# Patient Record
Sex: Female | Born: 1935
Health system: Southern US, Community
[De-identification: ages and names within clinical notes are randomized; demographics above are authoritative.]

## PROBLEM LIST (undated history)

## (undated) DIAGNOSIS — D649 Anemia, unspecified: Secondary | ICD-10-CM

## (undated) DIAGNOSIS — R109 Unspecified abdominal pain: Secondary | ICD-10-CM

## (undated) DIAGNOSIS — Z8719 Personal history of other diseases of the digestive system: Secondary | ICD-10-CM

## (undated) DIAGNOSIS — IMO0002 Reserved for concepts with insufficient information to code with codable children: Secondary | ICD-10-CM

## (undated) DIAGNOSIS — K219 Gastro-esophageal reflux disease without esophagitis: Secondary | ICD-10-CM

## (undated) DIAGNOSIS — N259 Disorder resulting from impaired renal tubular function, unspecified: Secondary | ICD-10-CM

## (undated) DIAGNOSIS — J45909 Unspecified asthma, uncomplicated: Secondary | ICD-10-CM

## (undated) DIAGNOSIS — J309 Allergic rhinitis, unspecified: Secondary | ICD-10-CM

## (undated) DIAGNOSIS — K279 Peptic ulcer, site unspecified, unspecified as acute or chronic, without hemorrhage or perforation: Secondary | ICD-10-CM

## (undated) DIAGNOSIS — H269 Unspecified cataract: Secondary | ICD-10-CM

## (undated) DIAGNOSIS — K56609 Unspecified intestinal obstruction, unspecified as to partial versus complete obstruction: Secondary | ICD-10-CM

## (undated) DIAGNOSIS — B029 Zoster without complications: Secondary | ICD-10-CM

## (undated) DIAGNOSIS — E7800 Pure hypercholesterolemia, unspecified: Secondary | ICD-10-CM

## (undated) DIAGNOSIS — I10 Essential (primary) hypertension: Secondary | ICD-10-CM

## (undated) DIAGNOSIS — R0789 Other chest pain: Secondary | ICD-10-CM

## (undated) DIAGNOSIS — J449 Chronic obstructive pulmonary disease, unspecified: Secondary | ICD-10-CM

## (undated) DIAGNOSIS — E039 Hypothyroidism, unspecified: Secondary | ICD-10-CM

## (undated) DIAGNOSIS — D692 Other nonthrombocytopenic purpura: Secondary | ICD-10-CM

## (undated) DIAGNOSIS — Z780 Asymptomatic menopausal state: Secondary | ICD-10-CM

## (undated) DIAGNOSIS — N209 Urinary calculus, unspecified: Secondary | ICD-10-CM

## (undated) DIAGNOSIS — F450 Somatization disorder: Secondary | ICD-10-CM

## (undated) HISTORY — DX: Allergic rhinitis, unspecified: J30.9

## (undated) HISTORY — DX: Unspecified intestinal obstruction, unspecified as to partial versus complete obstruction: K56.609

## (undated) HISTORY — DX: Disorder resulting from impaired renal tubular function, unspecified: N25.9

## (undated) HISTORY — PX: COLON SURGERY: SHX602

## (undated) HISTORY — DX: Chronic obstructive pulmonary disease, unspecified: J44.9

## (undated) HISTORY — DX: Anemia, unspecified: D64.9

## (undated) HISTORY — DX: Somatization disorder: F45.0

## (undated) HISTORY — PX: OTHER SURGICAL HISTORY: SHX169

## (undated) HISTORY — DX: Peptic ulcer, site unspecified, unspecified as acute or chronic, without hemorrhage or perforation: K27.9

## (undated) HISTORY — PX: TONSILLECTOMY: SHX5217

## (undated) HISTORY — DX: Gastro-esophageal reflux disease without esophagitis: K21.9

## (undated) HISTORY — PX: TUBAL LIGATION: SHX77

## (undated) HISTORY — DX: Unspecified asthma, uncomplicated: J45.909

## (undated) HISTORY — DX: Other chest pain: R07.89

## (undated) HISTORY — DX: Unspecified abdominal pain: R10.9

## (undated) HISTORY — DX: Personal history of other diseases of the digestive system: Z87.19

## (undated) HISTORY — DX: Pure hypercholesterolemia, unspecified: E78.00

## (undated) HISTORY — DX: Reserved for concepts with insufficient information to code with codable children: IMO0002

## (undated) HISTORY — DX: Hypothyroidism, unspecified: E03.9

## (undated) HISTORY — DX: Asymptomatic menopausal state: Z78.0

## (undated) HISTORY — DX: Urinary calculus, unspecified: N20.9

## (undated) HISTORY — DX: Unspecified cataract: H26.9

## (undated) HISTORY — DX: Essential (primary) hypertension: I10

---
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
## 1957-02-07 HISTORY — PX: APPENDECTOMY: SHX54

## 1965-02-07 HISTORY — PX: NASAL SINUS SURGERY: SHX719

## 1968-02-08 HISTORY — PX: DENTAL SURGERY: SHX609

## 1968-10-08 HISTORY — PX: OTHER SURGICAL HISTORY: SHX169

## 1979-02-08 HISTORY — PX: ABDOMINAL HYSTERECTOMY: SHX81

## 1981-02-07 HISTORY — PX: CHOLECYSTECTOMY: SHX55

## 2001-06-22 ENCOUNTER — Ambulatory Visit (HOSPITAL_COMMUNITY): Admission: RE | Admit: 2001-06-22 | Discharge: 2001-06-22 | Payer: Self-pay | Admitting: Family Medicine

## 2001-09-12 ENCOUNTER — Encounter: Payer: Self-pay | Admitting: Family Medicine

## 2001-09-12 ENCOUNTER — Ambulatory Visit (HOSPITAL_COMMUNITY): Admission: RE | Admit: 2001-09-12 | Discharge: 2001-09-12 | Payer: Self-pay | Admitting: Family Medicine

## 2002-10-10 ENCOUNTER — Encounter: Payer: Self-pay | Admitting: Family Medicine

## 2002-10-10 ENCOUNTER — Encounter: Admission: RE | Admit: 2002-10-10 | Discharge: 2002-10-10 | Payer: Self-pay | Admitting: Family Medicine

## 2003-12-16 ENCOUNTER — Encounter: Admission: RE | Admit: 2003-12-16 | Discharge: 2003-12-16 | Payer: Self-pay | Admitting: Endocrinology

## 2004-01-29 ENCOUNTER — Ambulatory Visit: Payer: Self-pay | Admitting: Endocrinology

## 2004-02-10 ENCOUNTER — Ambulatory Visit: Payer: Self-pay | Admitting: *Deleted

## 2004-02-19 ENCOUNTER — Ambulatory Visit: Payer: Self-pay

## 2004-04-15 ENCOUNTER — Ambulatory Visit: Payer: Self-pay | Admitting: *Deleted

## 2004-07-29 ENCOUNTER — Ambulatory Visit: Payer: Self-pay | Admitting: Endocrinology

## 2004-08-12 ENCOUNTER — Encounter: Admission: RE | Admit: 2004-08-12 | Discharge: 2004-08-12 | Payer: Self-pay | Admitting: Endocrinology

## 2004-08-19 ENCOUNTER — Ambulatory Visit: Payer: Self-pay | Admitting: Endocrinology

## 2004-08-20 ENCOUNTER — Ambulatory Visit: Payer: Self-pay | Admitting: Cardiology

## 2004-08-26 ENCOUNTER — Ambulatory Visit (HOSPITAL_COMMUNITY): Admission: RE | Admit: 2004-08-26 | Discharge: 2004-08-26 | Payer: Self-pay | Admitting: Endocrinology

## 2004-10-19 ENCOUNTER — Ambulatory Visit: Payer: Self-pay | Admitting: *Deleted

## 2004-10-21 ENCOUNTER — Ambulatory Visit: Payer: Self-pay | Admitting: Endocrinology

## 2004-10-28 ENCOUNTER — Ambulatory Visit: Payer: Self-pay | Admitting: Endocrinology

## 2004-11-10 ENCOUNTER — Ambulatory Visit: Payer: Self-pay | Admitting: Endocrinology

## 2004-12-09 ENCOUNTER — Ambulatory Visit: Payer: Self-pay | Admitting: Endocrinology

## 2004-12-28 ENCOUNTER — Ambulatory Visit: Payer: Self-pay | Admitting: Internal Medicine

## 2005-04-19 ENCOUNTER — Encounter: Admission: RE | Admit: 2005-04-19 | Discharge: 2005-04-19 | Payer: Self-pay | Admitting: Endocrinology

## 2005-05-03 ENCOUNTER — Ambulatory Visit: Payer: Self-pay | Admitting: *Deleted

## 2005-08-17 ENCOUNTER — Ambulatory Visit: Payer: Self-pay | Admitting: Endocrinology

## 2005-08-25 ENCOUNTER — Ambulatory Visit: Payer: Self-pay | Admitting: Endocrinology

## 2005-09-22 ENCOUNTER — Ambulatory Visit: Payer: Self-pay | Admitting: Internal Medicine

## 2005-09-30 ENCOUNTER — Ambulatory Visit: Payer: Self-pay | Admitting: Internal Medicine

## 2005-09-30 LAB — HM COLONOSCOPY

## 2005-10-02 ENCOUNTER — Inpatient Hospital Stay (HOSPITAL_COMMUNITY): Admission: EM | Admit: 2005-10-02 | Discharge: 2005-10-13 | Payer: Self-pay | Admitting: Emergency Medicine

## 2005-10-04 ENCOUNTER — Ambulatory Visit: Payer: Self-pay | Admitting: Gastroenterology

## 2005-10-04 ENCOUNTER — Encounter (INDEPENDENT_AMBULATORY_CARE_PROVIDER_SITE_OTHER): Payer: Self-pay | Admitting: *Deleted

## 2005-10-25 ENCOUNTER — Ambulatory Visit: Payer: Self-pay | Admitting: Endocrinology

## 2005-11-14 ENCOUNTER — Ambulatory Visit: Payer: Self-pay | Admitting: Endocrinology

## 2006-02-24 ENCOUNTER — Ambulatory Visit: Payer: Self-pay | Admitting: Endocrinology

## 2006-02-24 LAB — CONVERTED CEMR LAB
Hemoglobin: 11.8 g/dL — ABNORMAL LOW (ref 12.0–15.0)
MCHC: 32.2 g/dL (ref 30.0–36.0)
RDW: 12.4 % (ref 11.5–14.6)
Saturation Ratios: 29.1 % (ref 20.0–50.0)
Transferrin: 213.7 mg/dL (ref >=212.0)
WBC: 6.7 10*3/uL (ref 4.5–10.5)

## 2006-03-10 ENCOUNTER — Ambulatory Visit: Payer: Self-pay | Admitting: Internal Medicine

## 2006-03-22 ENCOUNTER — Ambulatory Visit: Payer: Self-pay | Admitting: Internal Medicine

## 2006-04-21 ENCOUNTER — Encounter: Admission: RE | Admit: 2006-04-21 | Discharge: 2006-04-21 | Payer: Self-pay | Admitting: Endocrinology

## 2006-05-09 ENCOUNTER — Ambulatory Visit: Payer: Self-pay | Admitting: *Deleted

## 2006-05-09 LAB — CONVERTED CEMR LAB
BUN: 13 mg/dL (ref 6–23)
Calcium: 9.9 mg/dL (ref 8.4–10.5)
Chloride: 106 meq/L (ref 96–112)
Creatinine, Ser: 0.6 mg/dL (ref 0.4–1.2)
GFR calc Af Amer: 127 mL/min
Glucose, Bld: 101 mg/dL — ABNORMAL HIGH (ref 70–99)
Sodium: 142 meq/L (ref 135–145)

## 2006-05-11 ENCOUNTER — Ambulatory Visit: Payer: Self-pay | Admitting: Internal Medicine

## 2006-08-04 ENCOUNTER — Ambulatory Visit (HOSPITAL_COMMUNITY): Admission: RE | Admit: 2006-08-04 | Discharge: 2006-08-04 | Payer: Self-pay | Admitting: Internal Medicine

## 2006-08-23 ENCOUNTER — Ambulatory Visit: Payer: Self-pay | Admitting: Internal Medicine

## 2006-09-21 ENCOUNTER — Encounter: Payer: Self-pay | Admitting: Endocrinology

## 2006-09-21 DIAGNOSIS — K219 Gastro-esophageal reflux disease without esophagitis: Secondary | ICD-10-CM

## 2006-09-21 DIAGNOSIS — J309 Allergic rhinitis, unspecified: Secondary | ICD-10-CM | POA: Insufficient documentation

## 2006-09-21 DIAGNOSIS — E039 Hypothyroidism, unspecified: Secondary | ICD-10-CM

## 2006-09-21 DIAGNOSIS — J45909 Unspecified asthma, uncomplicated: Secondary | ICD-10-CM | POA: Insufficient documentation

## 2006-09-21 DIAGNOSIS — N259 Disorder resulting from impaired renal tubular function, unspecified: Secondary | ICD-10-CM

## 2006-09-21 DIAGNOSIS — I10 Essential (primary) hypertension: Secondary | ICD-10-CM

## 2006-09-21 DIAGNOSIS — D649 Anemia, unspecified: Secondary | ICD-10-CM

## 2006-09-21 DIAGNOSIS — Z8719 Personal history of other diseases of the digestive system: Secondary | ICD-10-CM

## 2006-09-21 HISTORY — DX: Personal history of other diseases of the digestive system: Z87.19

## 2006-09-21 HISTORY — DX: Disorder resulting from impaired renal tubular function, unspecified: N25.9

## 2006-09-21 HISTORY — DX: Hypothyroidism, unspecified: E03.9

## 2006-09-21 HISTORY — DX: Unspecified asthma, uncomplicated: J45.909

## 2006-09-21 HISTORY — DX: Anemia, unspecified: D64.9

## 2006-09-21 HISTORY — DX: Allergic rhinitis, unspecified: J30.9

## 2006-09-21 HISTORY — DX: Gastro-esophageal reflux disease without esophagitis: K21.9

## 2006-09-21 HISTORY — DX: Essential (primary) hypertension: I10

## 2007-04-25 ENCOUNTER — Ambulatory Visit: Payer: Self-pay | Admitting: Endocrinology

## 2007-04-25 DIAGNOSIS — F450 Somatization disorder: Secondary | ICD-10-CM

## 2007-04-25 HISTORY — DX: Somatization disorder: F45.0

## 2007-04-25 LAB — CONVERTED CEMR LAB
ALT: 17 units/L (ref 0–35)
AST: 25 units/L (ref 0–37)
Albumin: 3.9 g/dL (ref 3.5–5.2)
Alkaline Phosphatase: 41 units/L (ref 39–117)
Basophils Absolute: 0.1 10*3/uL (ref 0.0–0.1)
Basophils Relative: 2 % — ABNORMAL HIGH (ref 0.0–1.0)
Bilirubin Urine: NEGATIVE
Bilirubin, Direct: 0.1 mg/dL (ref 0.0–0.3)
CO2: 29 meq/L (ref 19–32)
Creatinine, Ser: 0.5 mg/dL (ref 0.4–1.2)
Eosinophils Relative: 17.6 % — ABNORMAL HIGH (ref 0.0–5.0)
GFR calc Af Amer: 156 mL/min
GFR calc non Af Amer: 129 mL/min
Glucose, Bld: 87 mg/dL (ref 70–99)
HDL: 56.3 mg/dL (ref >39.0)
Hemoglobin: 11.9 g/dL — ABNORMAL LOW (ref 12.0–15.0)
Ketones, ur: NEGATIVE mg/dL
MCHC: 32.5 g/dL (ref 30.0–36.0)
Mucus, UA: NEGATIVE
Neutro Abs: 2.4 10*3/uL (ref 1.4–7.7)
Nitrite: NEGATIVE
Platelets: 299 10*3/uL (ref 150–400)
RBC: 3.83 M/uL — ABNORMAL LOW (ref 3.87–5.11)
RDW: 12.4 % (ref 11.5–14.6)
Sodium: 138 meq/L (ref 135–145)
Total Bilirubin: 0.8 mg/dL (ref 0.3–1.2)
Total Protein, Urine: NEGATIVE mg/dL
Triglycerides: 140 mg/dL (ref 0–149)
Urobilinogen, UA: 0.2 (ref 0.0–1.0)
VLDL: 28 mg/dL (ref 0–40)
WBC: 6.6 10*3/uL (ref 4.5–10.5)

## 2007-05-02 ENCOUNTER — Ambulatory Visit: Payer: Self-pay | Admitting: Endocrinology

## 2007-05-04 ENCOUNTER — Telehealth (INDEPENDENT_AMBULATORY_CARE_PROVIDER_SITE_OTHER): Payer: Self-pay | Admitting: *Deleted

## 2007-05-23 ENCOUNTER — Encounter: Admission: RE | Admit: 2007-05-23 | Discharge: 2007-05-23 | Payer: Self-pay | Admitting: Endocrinology

## 2008-05-26 ENCOUNTER — Encounter: Admission: RE | Admit: 2008-05-26 | Discharge: 2008-05-26 | Payer: Self-pay | Admitting: Endocrinology

## 2008-05-28 ENCOUNTER — Ambulatory Visit: Payer: Self-pay | Admitting: Endocrinology

## 2008-05-28 DIAGNOSIS — Z780 Asymptomatic menopausal state: Secondary | ICD-10-CM | POA: Insufficient documentation

## 2008-05-28 DIAGNOSIS — N209 Urinary calculus, unspecified: Secondary | ICD-10-CM

## 2008-05-28 DIAGNOSIS — R0789 Other chest pain: Secondary | ICD-10-CM | POA: Insufficient documentation

## 2008-05-28 DIAGNOSIS — R109 Unspecified abdominal pain: Secondary | ICD-10-CM

## 2008-05-28 HISTORY — DX: Asymptomatic menopausal state: Z78.0

## 2008-05-28 HISTORY — DX: Unspecified abdominal pain: R10.9

## 2008-05-28 HISTORY — DX: Other chest pain: R07.89

## 2008-05-28 HISTORY — DX: Urinary calculus, unspecified: N20.9

## 2008-06-02 ENCOUNTER — Telehealth: Payer: Self-pay | Admitting: Endocrinology

## 2008-06-05 ENCOUNTER — Ambulatory Visit: Payer: Self-pay | Admitting: Internal Medicine

## 2008-06-05 ENCOUNTER — Telehealth: Payer: Self-pay | Admitting: Endocrinology

## 2008-06-05 ENCOUNTER — Encounter: Payer: Self-pay | Admitting: Endocrinology

## 2008-08-05 ENCOUNTER — Telehealth: Payer: Self-pay | Admitting: Endocrinology

## 2008-12-22 ENCOUNTER — Telehealth: Payer: Self-pay | Admitting: Internal Medicine

## 2009-05-19 ENCOUNTER — Telehealth: Payer: Self-pay | Admitting: Endocrinology

## 2009-06-05 ENCOUNTER — Ambulatory Visit: Payer: Self-pay | Admitting: Endocrinology

## 2009-06-05 DIAGNOSIS — E7800 Pure hypercholesterolemia, unspecified: Secondary | ICD-10-CM

## 2009-06-05 HISTORY — DX: Pure hypercholesterolemia, unspecified: E78.00

## 2009-06-06 ENCOUNTER — Encounter: Payer: Self-pay | Admitting: Endocrinology

## 2009-06-06 LAB — CONVERTED CEMR LAB
Calcium, Total (PTH): 10.6 mg/dL — ABNORMAL HIGH (ref 8.4–10.5)
PTH: 32.9 pg/mL (ref 14.0–72.0)

## 2009-06-11 ENCOUNTER — Encounter: Admission: RE | Admit: 2009-06-11 | Discharge: 2009-06-11 | Payer: Self-pay | Admitting: Endocrinology

## 2009-06-24 ENCOUNTER — Ambulatory Visit: Payer: Self-pay | Admitting: Endocrinology

## 2009-06-25 LAB — CONVERTED CEMR LAB
Fecal Occult Blood: NEGATIVE
OCCULT 3: NEGATIVE
OCCULT 4: NEGATIVE
OCCULT 5: NEGATIVE

## 2010-02-28 ENCOUNTER — Encounter: Payer: Self-pay | Admitting: Endocrinology

## 2010-03-07 LAB — CONVERTED CEMR LAB
AST: 27 units/L (ref 0–37)
Albumin: 3.9 g/dL (ref 3.5–5.2)
Albumin: 4.2 g/dL (ref 3.5–5.2)
Basophils Absolute: 0 10*3/uL (ref 0.0–0.1)
Basophils Absolute: 0.1 10*3/uL (ref 0.0–0.1)
Basophils Relative: 0.4 % (ref 0.0–3.0)
Bilirubin, Direct: 0.1 mg/dL (ref 0.0–0.3)
Bilirubin, Direct: 0.2 mg/dL (ref 0.0–0.3)
CO2: 30 meq/L (ref 19–32)
Calcium: 10.4 mg/dL (ref 8.4–10.5)
Chloride: 104 meq/L (ref 96–112)
Cholesterol: 194 mg/dL (ref 0–200)
Creatinine, Ser: 0.6 mg/dL (ref 0.4–1.2)
Eosinophils Relative: 12.1 % — ABNORMAL HIGH (ref 0.0–5.0)
Eosinophils Relative: 12.1 % — ABNORMAL HIGH (ref 0.0–5.0)
GFR calc non Af Amer: 104.11 mL/min (ref >60)
Glucose, Bld: 86 mg/dL (ref 70–99)
Glucose, Bld: 91 mg/dL (ref 70–99)
HCT: 34.3 % — ABNORMAL LOW (ref 36.0–46.0)
HCT: 36.1 % (ref 36.0–46.0)
HDL: 69.7 mg/dL (ref >39.00)
Hemoglobin: 12.7 g/dL (ref 12.0–15.0)
Iron: 53 ug/dL (ref 42–145)
LDL Cholesterol: 111 mg/dL — ABNORMAL HIGH (ref 0–99)
Lymphocytes Relative: 31 % (ref 12.0–46.0)
MCV: 93.9 fL (ref 78.0–100.0)
Monocytes Absolute: 0.7 10*3/uL (ref 0.1–1.0)
Neutro Abs: 3 10*3/uL (ref 1.4–7.7)
RBC: 3.62 M/uL — ABNORMAL LOW (ref 3.87–5.11)
RBC: 3.84 M/uL — ABNORMAL LOW (ref 3.87–5.11)
Saturation Ratios: 22.1 % (ref 20.0–50.0)
Total Bilirubin: 0.8 mg/dL (ref 0.3–1.2)
Total CHOL/HDL Ratio: 3
Total CHOL/HDL Ratio: 3
Total Protein: 6.6 g/dL (ref 6.0–8.3)
Total Protein: 7 g/dL (ref 6.0–8.3)
Transferrin: 220.1 mg/dL (ref 212.0–360.0)
VLDL: 11 mg/dL (ref 0.0–40.0)
VLDL: 18.6 mg/dL (ref 0.0–40.0)
Vitamin B-12: 667 pg/mL (ref 211–911)
WBC: 6.1 10*3/uL (ref 4.5–10.5)
WBC: 6.3 10*3/uL (ref 4.5–10.5)

## 2010-03-09 NOTE — Progress Notes (Signed)
Summary: Rx request  Phone Note Call from Patient Call back at Home Phone [phone]   Caller: Patient Summary of Call: pt  called stating that she has made appt with SAE 04/29 and is requesting a 30 day supply of her Levothyroxine to [facility] [address] Initial call taken by: Crissie Sickles, CMA,  May 19, 2009 10:49 AM    Prescriptions: LEVOTHYROXINE SODIUM 50 MCG TABS (LEVOTHYROXINE SODIUM) 1 qd  #30 x 0   Entered by:   Crissie Sickles, CMA   Authorized by:   Donavan Foil MD   Signed by:   Crissie Sickles, CMA on 05/19/2009   Method used:   Electronically to        [id number] (retail)       [place]       [place],   [address]       Ph: [phone] or [phone]       Fax: [phone]   RxID:   [id number]

## 2010-03-09 NOTE — Assessment & Plan Note (Signed)
Summary: YEARLY FU/ MEDICARE/ TO COME FASTING/NWS   Vital Signs:  Patient profile:   76 year old female Height:      68 inches (172.72 cm) Weight:      143.13 pounds (65.06 kg) BMI:     21.84 O2 Sat:      94 % on Room air Temp:     96.7 degrees F (35.94 degrees C) oral Pulse rate:   78 / minute BP sitting:   110 / 68  (left arm) Cuff size:   regular  Vitals Entered By: Gardenia Phlegm RMA (June 05, 2009 9:27 AM)  O2 Flow:  Room air CC: Yearly follow up/ pt states she is no longer taking Aerobid/ CF Is Patient Diabetic? No   CC:  Yearly follow up/ pt states she is no longer taking Aerobid/ CF.  History of Present Illness: here for regular wellness examination.  she does not drink or smoke.    Current Medications (verified): 1)  Multivitamins   Tabs (Multiple Vitamin) .... Take 1 By Mouth Qd 2)  Adult Aspirin Ec Low Strength 81 Mg  Tbec (Aspirin) .... Take 1 By Mouth Qd 3)  Aerobid-M 250 Mcg/act Aers (Flunisolide) .... 2 Puffs Two Times A Day 4)  Levothyroxine Sodium 50 Mcg Tabs (Levothyroxine Sodium) .Marland Kitchen.. 1 Qd 5)  Flovent Hfa 110 Mcg/act Aero (Fluticasone Propionate  Hfa) .... 2 Puffs Bid  Allergies (verified): 1)  ! * Foradil 2)  ! Macrobid 3)  ! * Asprin  Past History:  Past Medical History: Last updated: 09/21/2006 Allergic rhinitis Anemia-NOS Asthma Diverticulitis, hx of GERD Hypertension Hypothyroidism Renal insufficiency Degenrative Disc Disease Ulcers, Stomach Rheumatic fever Hx of kidney stones Hx of phlebitis/ Blood clos Dyslipidemia Eosinophilia  Family History: Reviewed history from 05/02/2007 and no changes required. mother had pancreatic cancer father had bladder cancer a son had primary bone cancer son has marfan's syndrome son has hemochromatosis  Social History: Reviewed history from 05/02/2007 and no changes required. retired divorced x many years 3 sons all live nearby  Review of Systems  The patient denies fever, vision  loss, decreased hearing, chest pain, syncope, dyspnea on exertion, prolonged cough, headaches, melena, severe indigestion/heartburn, and suspicious skin lesions.    Physical Exam  General:  normal appearance.   Head:  head: no deformity eyes: no periorbital swelling, no proptosis external nose and ears are normal mouth: no lesion seen Breasts:  No tenderness, masses, nipple discharge, or skin abnormalities.  Lungs:  Clear to auscultation bilaterally. Normal respiratory effort.  Heart:  Regular rate and rhythm without murmurs or gallops noted. Normal S1,S2.   Rectal:  refused Genitalia:  refused Msk:  muscle bulk and strength are grossly normal.  no obvious joint swelling.  gait is normal and steady  Pulses:  dorsalis pedis intact bilat.  no carotid bruit  Extremities:  no deformity.  no ulcer on the feet.  feet are of normal color and temp.  no edema  Neurologic:  cn 2-12 grossly intact.   readily moves all 4's.   sensation is intact to touch on the feet  Skin:  normal texture and temp.  no rash.  not diaphoretic  Cervical Nodes:  No significant adenopathy.  Psych:  Alert and cooperative; normal mood and affect; normal attention span and concentration.   Additional Exam:  SEPARATE EVALUATION FOLLOWS--EACH PROBLEM HERE IS NEW, NOT RESPONDING TO TREATMENT, OR POSES SIGNIFICANT RISK TO THE PATIENT'S HEALTH: HISTORY OF THE PRESENT ILLNESS: the status of at least 3  ongoing medical problems is addressed today: pt states 28 years of right flank pain, which persists. hypothyroid:  she takes synthroid as rx'ed.  she has weight gain she has h/o anemia.  no brbpr PAST MEDICAL HISTORY reviewed and up to date today REVIEW OF SYSTEMS: denies hematuria and depression PHYSICAL EXAMINATION: neck: no goiter abdomen is soft, nontender.  no hepatosplenomegaly.   not distended.  no hernia skin: not pale LAB/XRAY RESULTS: Hemoglobin           [L]  11.9 g/dL                    12.0-15.0 Hematocrit           [L]  34.3 %                      36.0-46.0 FastTSH              [H]  6.72 uIU/mL                 0.35-5.50 Iron Saturation      [L]  17.9 %                      20.0-50.0 Cholesterol LDL        120.8 mg/dL IMPRESSION: fe-deficiency anemia, needs increased rx hypothyroid, needs increased rx flank pain in pt with h/o urolithoiasis dyslipidemia PLAN: see instruction page   Impression & Recommendations:  Problem # 1:  ROUTINE GENERAL MEDICAL EXAM@HEALTH  CARE FACL (ICD-V70.0)  Medications Added to Medication List This Visit: 1)  Levothyroxine Sodium 75 Mcg Tabs (Levothyroxine sodium) .Marland Kitchen.. 1 once daily  Other Orders: EKG w/ Interpretation (93000) T-Parathyroid Hormone, Intact w/ Calcium (26378-58850) Radiology Referral (Radiology) TLB-Lipid Panel (80061-LIPID) TLB-BMP (Basic Metabolic Panel-BMET) (27741-OINOMVE) TLB-CBC Platelet - w/Differential (85025-CBCD) TLB-Hepatic/Liver Function Pnl (80076-HEPATIC) TLB-TSH (Thyroid Stimulating Hormone) (84443-TSH) TLB-IBC Pnl (Iron/FE;Transferrin) (83550-IBC) Est. Patient Level IV (72094) Est. Patient 65& > (70962)  Preventive Care Screening  Last Flu Shot:    Date:  12/08/2008    Results:  historical    Patient Instructions: 1)  blood tests today. 2)  check ultrasound of the right kidney.  you will be called with a day and time for an appointment 3)  tests are being ordered for you today.  a few days after the test(s), please call [phone] to hear your test results. 4)  pending the test results, please continue the same medications for now 5)  please consider these measures for your health:  minimize alcohol.  do not use tobacco products.  have a colonoscopy at least every 10 years from age 7.  keep firearms safely stored.  always use seat belts.  have working smoke alarms in your home.  see the dentist regularly.  never drive under the influence of alcohol or drugs (including prescription drugs).  those  with fair skin should take precautions against the sun. 6)  please let me know what your wishes would be, if artificial life support measures should become necessary.  it is critically important to prevent falling down (keep floor areas well-lit, dry, and free of loose objects) 7)  we discussed code status.  pt requests full code, but would not want to be started or maintained on artificial life-support measures if there was not a reasonable chance of recovery 8)  (update: i left message on phone-tree: take fe 1/day.  increase synthroid to 75/day). Prescriptions: FLOVENT HFA 110 MCG/ACT AERO (FLUTICASONE PROPIONATE  HFA) 2 puffs bid  #1 month x 10   Entered and Authorized by:   Donavan Foil MD   Signed by:   Donavan Foil MD on 06/07/2009   Method used:   Electronically to        [id number] (retail)       [address]       [place], [place]  [address]       Ph: [phone] or [phone]       Fax: [phone]   RxID:   [id number] LEVOTHYROXINE SODIUM 75 MCG TABS (LEVOTHYROXINE SODIUM) 1 once daily  #30 x 11   Entered and Authorized by:   Donavan Foil MD   Signed by:   Donavan Foil MD on 06/07/2009   Method used:   Electronically to        [id number] (retail)       [address]       [place], [place]  [address]       Ph: [phone] or [phone]       Fax: [phone]   RxID:   [id number]   Preventive Care Screening  Last Flu Shot:    Date:  12/08/2008    Results:  historical

## 2010-05-21 ENCOUNTER — Other Ambulatory Visit: Payer: Self-pay | Admitting: Endocrinology

## 2010-05-26 ENCOUNTER — Other Ambulatory Visit: Payer: Self-pay | Admitting: Endocrinology

## 2010-05-26 DIAGNOSIS — Z1231 Encounter for screening mammogram for malignant neoplasm of breast: Secondary | ICD-10-CM

## 2010-06-10 ENCOUNTER — Encounter: Payer: Self-pay | Admitting: Endocrinology

## 2010-06-10 DIAGNOSIS — F450 Somatization disorder: Secondary | ICD-10-CM

## 2010-06-11 ENCOUNTER — Other Ambulatory Visit (INDEPENDENT_AMBULATORY_CARE_PROVIDER_SITE_OTHER): Payer: Medicare Other

## 2010-06-11 ENCOUNTER — Encounter: Payer: Self-pay | Admitting: Endocrinology

## 2010-06-11 ENCOUNTER — Ambulatory Visit (INDEPENDENT_AMBULATORY_CARE_PROVIDER_SITE_OTHER): Payer: Medicare Other | Admitting: Endocrinology

## 2010-06-11 VITALS — BP 122/78 | HR 71 | Temp 97.9°F | Ht 68.0 in | Wt 140.0 lb

## 2010-06-11 DIAGNOSIS — N259 Disorder resulting from impaired renal tubular function, unspecified: Secondary | ICD-10-CM

## 2010-06-11 DIAGNOSIS — Z0000 Encounter for general adult medical examination without abnormal findings: Secondary | ICD-10-CM | POA: Insufficient documentation

## 2010-06-11 DIAGNOSIS — E039 Hypothyroidism, unspecified: Secondary | ICD-10-CM

## 2010-06-11 DIAGNOSIS — Z136 Encounter for screening for cardiovascular disorders: Secondary | ICD-10-CM

## 2010-06-11 DIAGNOSIS — D649 Anemia, unspecified: Secondary | ICD-10-CM

## 2010-06-11 DIAGNOSIS — Z79899 Other long term (current) drug therapy: Secondary | ICD-10-CM | POA: Insufficient documentation

## 2010-06-11 DIAGNOSIS — L97909 Non-pressure chronic ulcer of unspecified part of unspecified lower leg with unspecified severity: Secondary | ICD-10-CM

## 2010-06-11 DIAGNOSIS — E7800 Pure hypercholesterolemia, unspecified: Secondary | ICD-10-CM

## 2010-06-11 DIAGNOSIS — I10 Essential (primary) hypertension: Secondary | ICD-10-CM

## 2010-06-11 LAB — HEPATIC FUNCTION PANEL
ALT: 14 U/L (ref 0–35)
AST: 24 U/L (ref 0–37)
Alkaline Phosphatase: 45 U/L (ref 39–117)
Total Bilirubin: 0.6 mg/dL (ref 0.3–1.2)

## 2010-06-11 LAB — BASIC METABOLIC PANEL
BUN: 16 mg/dL (ref 6–23)
CO2: 27 mEq/L (ref 19–32)
Chloride: 104 mEq/L (ref 96–112)
GFR: 134.31 mL/min (ref >60.00)
Glucose, Bld: 88 mg/dL (ref 70–99)
Potassium: 4.8 mEq/L (ref 3.5–5.1)
Sodium: 140 mEq/L (ref 135–145)

## 2010-06-11 LAB — CBC WITH DIFFERENTIAL/PLATELET
Basophils Relative: 0.6 % (ref 0.0–3.0)
Eosinophils Relative: 12.3 % — ABNORMAL HIGH (ref 0.0–5.0)
HCT: 35 % — ABNORMAL LOW (ref 36.0–46.0)
Hemoglobin: 11.9 g/dL — ABNORMAL LOW (ref 12.0–15.0)
MCHC: 33.9 g/dL (ref 30.0–36.0)
MCV: 94.8 fl (ref 78.0–100.0)
RDW: 13.1 % (ref 11.5–14.6)

## 2010-06-11 LAB — IBC PANEL: Iron: 68 ug/dL (ref 42–145)

## 2010-06-11 LAB — URINALYSIS, ROUTINE W REFLEX MICROSCOPIC
Bilirubin Urine: NEGATIVE
Hgb urine dipstick: NEGATIVE
Ketones, ur: NEGATIVE
Leukocytes, UA: NEGATIVE
Nitrite: NEGATIVE

## 2010-06-11 MED ORDER — TRIAMCINOLONE ACETONIDE 0.1 % EX CREA: TOPICAL_CREAM | Freq: Three times a day (TID) | CUTANEOUS | Status: AC

## 2010-06-11 NOTE — Progress Notes (Signed)
Subjective:    Patient ID: [name], female    DOB: [birth year], 76 y.o.   MRN: [id number]  HPI here for regular wellness examination.  she's feeling pretty well in general, and says chronic med probs are stable, except as noted below.  Past Medical History  Diagnosis Date  . HYPOTHYROIDISM 09/21/2006  . HYPERCHOLESTEROLEMIA 06/05/2009  . ANEMIA-NOS 09/21/2006  . Somatization disorder 04/25/2007  . HYPERTENSION 09/21/2006  . ALLERGIC RHINITIS 09/21/2006  . ASTHMA 09/21/2006  . GERD 09/21/2006  . RENAL INSUFFICIENCY 09/21/2006  . UNSPECIFIED URINARY CALCULUS 05/28/2008  . CHEST PAIN, ATYPICAL 05/28/2008  . ABDOMINAL PAIN, CHRONIC 05/28/2008  . DIVERTICULITIS, HX OF 09/21/2006  . ASYMPTOMATIC POSTMENOPAUSAL STATUS 05/28/2008  . DDD (degenerative disc disease)     Past Surgical History  Procedure Date  . Nasal sinus surgery 1967  . Dental surgery 1970  . Appendectomy 1959  . Cholecystectomy 1983  . Abdominal hysterectomy 1981  . Pulmonary thrombosis   . Tonsillectomy   . Asthma & pneumonia birth 20's  . Acute nephritis 1958    History   Social History  . Marital Status: Divorced    Spouse Name: N/A    Number of Children: N/A  . Years of Education: N/A   Occupational History  . Not on file.   Social History Main Topics  . Smoking status: Never Smoker   . Smokeless tobacco: Not on file  . Alcohol Use: No  . Drug Use: No  . Sexually Active:    Other Topics Concern  . Not on file   Social History Narrative  . No narrative on file  divorced retired  Current Outpatient Prescriptions on File Prior to Visit  Medication Sig Dispense Refill  . aspirin 81 MG EC tablet Take 81 mg by mouth daily.        . fluticasone (FLOVENT HFA) 110 MCG/ACT inhaler Inhale 2 puffs into the lungs 2 (two) times daily.        Marland Kitchen levothyroxine (SYNTHROID, LEVOTHROID) 75 MCG tablet 1 ONCE DAILY  30 tablet  1  . Multiple Vitamin (MULTIVITAMIN) tablet Take 1 tablet by mouth daily.           Allergies  Allergen Reactions  . Nitrofurantoin     REACTION: Syncope    No family history on file.  BP 122/78  Pulse 71  Temp(Src) 97.9 F (36.6 C) (Oral)  Ht 5' 8"  (1.727 m)  Wt 140 lb (63.504 kg)  BMI 21.29 kg/m2  SpO2 95%     Review of Systems  Constitutional:       Denies weight gain  HENT: Negative for hearing loss.   Eyes: Negative for visual disturbance.  Respiratory: Negative for shortness of breath.   Cardiovascular: Negative for chest pain.  Gastrointestinal: Negative for blood in stool.  Genitourinary: Negative for hematuria.  Musculoskeletal: Negative for arthralgias.  Skin: Negative for pallor.  Neurological: Negative for syncope.  Hematological: Bruises/bleeds easily.  Psychiatric/Behavioral: The patient is not nervous/anxious.        Objective:   Physical Exam VS: see vs page GEN: no distress HEAD: head: no deformity eyes: no periorbital swelling, no proptosis external nose and ears are normal mouth: no lesion seen NECK: supple, thyroid is not enlarged CHEST WALL: no deformity BREASTS:  No mass.  No d/c CV: reg rate and rhythm, no murmur ABD: abdomen is soft, nontender.  no hepatosplenomegaly.  not distended.  no hernia MUSCULOSKELETAL: muscle bulk and strength are grossly normal.  no obvious joint swelling.  gait is normal and steady EXTEMITIES: no deformity.  no ulcer on the feet.  feet are of normal color and temp.  no edema.  The right great toe is surgically absent. PULSES: dorsalis pedis intact bilat.  no carotid bruit NEURO:  cn 2-12 grossly intact.   readily moves all 4's.  sensation is intact to touch on the feet   NODES:  None palpable at the neck PSYCH: alert, oriented x3.  Does not appear anxious nor depressed.      Wellness visit today, with problems stable, except as noted.    Assessment & Plan:  SEPARATE EVALUATION FOLLOWS--EACH PROBLEM HERE IS NEW, NOT RESPONDING TO TREATMENT, OR POSES SIGNIFICANT RISK TO THE  PATIENT'S HEALTH: HISTORY OF THE PRESENT ILLNESS: Pt has h/o anemia.  W/u has been neg. Pt has h/o flucutuating tsh values, more so than would be accounted for by dosing changes. Pt states few mos of intermittent mild rashes, mostly on the limbs, and assoc itching PAST MEDICAL HISTORY reviewed and up to date today REVIEW OF SYSTEMS: Denies fever and weight loss PHYSICAL EXAMINATION: Skin:  There is a 2 cm shallow ulcer at the left anterior tibial area LAB/XRAY RESULTS: tsh is noted IMPRESSION: Persistent anemia, with h/o neg w/u Hypothyroidism.  Fluctuating tsh is most likely caused by noncompliance.   Shallow skin ulcer with itching, new problem PLAN: See instruction page

## 2010-06-11 NOTE — Patient Instructions (Addendum)
i have sent a prescription to your pharmacy for a skin cream blood tests are being ordered for you today.  please call [phone] to hear your test results.  You will be prompted to enter the 9-digit "MRN" number that appears at the top left of this page, followed by #.  Then you will hear the message. please consider these measures for your health:  minimize alcohol.  do not use tobacco products.  have a colonoscopy at least every 10 years from age 76.  keep firearms safely stored.  always use seat belts.  have working smoke alarms in your home.  see an eye doctor and dentist regularly.  never drive under the influence of alcohol or drugs (including prescription drugs).  those with fair skin should take precautions against the sun. please let me know what your wishes would be, if artificial life support measures should become necessary.  it is critically important to prevent falling down (keep floor areas well-lit, dry, and free of loose objects) (update; we discussed code status.  pt requests full code, but would not want to be started or maintained on artificial life-support measures if there was not a reasonable chance of recovery) Please return in 1 year. (update: i left message on phone-tree:  Take synthroid consistently qd.  We'll follow mild anemia).

## 2010-06-16 DIAGNOSIS — L97909 Non-pressure chronic ulcer of unspecified part of unspecified lower leg with unspecified severity: Secondary | ICD-10-CM | POA: Insufficient documentation

## 2010-06-22 ENCOUNTER — Ambulatory Visit
Admission: RE | Admit: 2010-06-22 | Discharge: 2010-06-22 | Disposition: A | Discharge status: Home / Self Care | Payer: Medicare Other | Source: Ambulatory Visit | Attending: Endocrinology | Admitting: Endocrinology

## 2010-06-22 DIAGNOSIS — Z1231 Encounter for screening mammogram for malignant neoplasm of breast: Secondary | ICD-10-CM

## 2010-06-22 NOTE — Assessment & Plan Note (Signed)
[facility]                             PULMONARY OFFICE NOTE   [name], [name]                         MRN:          [id number]  DATE:08/23/2006                            DOB:          12-15-1934    PULMONARY SUMMARY FINAL FOLLOW-UP OFFICE VISIT:   HISTORY:  This is a 76 year old white female with multiple complaints  and a diagnosis of asthma that has never been substantiated.  She felt  no better on Symbicort, and so I asked her to stop Symbicort on the last  visit and take Xopenex p.r.n., and scheduled her for a methacholine  challenge test, which was accomplished on August 04, 2006 and did show  reversible air flow obstruction, with improvement after bronchodilators.   The patient said that she did notice dyspnea from the methacholine  challenge test and relief within 15 minutes of albuterol, to the point  where she could walk back to her car easily after she received  albuterol.  However, she also noticed multiple other complaints that  lasted for more than 24 hours, including severe fatigue.   She has not used Xopenex in the last several weeks, although she has it  available in her purse to use p.r.n.   PHYSICAL EXAMINATION:  GENERAL:  She is a chronically ill white female  in no acute distress.  VITAL SIGNS:  She is afebrile, with normal vital signs.  HEENT:  Unremarkable.  Oropharynx clear.  LUNG FIELDS:  Completely clear bilaterally to auscultation and  percussion.  HEART:  There is a regular rate and rhythm, without murmur, gallop, or  rub.  ABDOMEN: Soft, benign.  EXTREMITIES: Warm, without calf tenderness, cyanosis, clubbing, or  edema.   Hemosaturation 96% on room air.   IMPRESSION:  This patient clearly has asthma.  However, the methacholine  challenge test demonstrated that the only component of her problem that  is inducible with methacholine and then reversible with albuterol is  dyspnea.  Many of her other complaints  especially the sensation of  congestion that is worse when she wakes up in the morning probably  represent the effects of reflux.   Because she has not been able to be treated for reflux aggressively  chronically, it is not possible to say for sure that the vague symptoms  that she has are reflux related.  However, they certainly do not appear  to be pulmonary related in general and asthma related in particular, or  they would have been more reproducible with the methacholine challenge  test and more treatable with bronchodilators than has been the case.   In fact, up to 5% of the [place] population will have positive  methacholine challenge testing.  The issue is not that she has asthma  but to what extent it explains any of her symptoms.   I spent almost 30 minutes with the patient today, trying to help her  focus on what is asthma and what is not, but made very little headway.  I also was not able to help her understand that unless she is able to  treat reflux aggressively for up to 6 weeks, we cannot say to what  extent any of her other complaints are reflux related.  For these  issues, I will defer to Dr. Loanne Drilling and Dr. Henrene Pastor and will be happy to  see her back in the pulmonary clinic at Dr. Cordelia Pen discretion.   I did give her a refillable prescription for Xopenex and explained under  what circumstances she would be using it (symptoms that are similar to  what we brought on in the methacholine challenge test, and only those  symptoms).     Margaret Deem. Melvyn Novas, MD, [facility]  Electronically Signed    MBW/MedQ  DD: 08/23/2006  DT: 08/24/2006  Job #: [id number]   cc:   Hilliard Clark A. Loanne Drilling, MD  Docia Chuck. Henrene Pastor, MD

## 2010-06-25 NOTE — Assessment & Plan Note (Signed)
[facility]                                   ON-CALL NOTE   BAYLI, QUESINBERRY                         MRN:          [id number]  DATE:10/02/2005                            DOB:          10-Apr-1934    TELEPHONE NOTE:  Mrs. Gerlene Burdock, Mrs. Analycia Costales's daughter-in-law called  to state that her mother-in-law has been having abdominal pain since her  colonoscopy on Friday.  She apparently underwent a diagnostic colonoscopy.  She has had pain since her procedure.  She has had recurrent nausea with  vomiting and has not been able to eat.   I instructed Mrs. Overbeck to take her mother-in-law to the emergency room  where we will evaluate her for possible complications of the procedure  including perforation.                                   Sandy Salaam. Deatra Ina, MD, [facility]   RDK/MedQ  DD:  10/02/2005  DT:  10/03/2005  Job #:  [id number]   cc:   Docia Chuck. Geri Seminole., MD

## 2010-06-25 NOTE — Consult Note (Signed)
[name], [name]                  ACCOUNT NO.:  [id number]   MEDICAL RECORD NO.:  [id number]          PATIENT TYPE:  INP   LOCATION:  7341                         FACILITY:  [facility]   PHYSICIAN:  Dr. Grandville [name]           DATE OF BIRTH:  [birth year]   DATE OF CONSULTATION:  10/03/2005  DATE OF DISCHARGE:                                   CONSULTATION   GI:  Margaret Salaam. Deatra Ina, MD,FACG   PRIMARY CARE PHYSICIAN:  Margaret A. Loanne Drilling, MD   REASON FOR CONSULTATION:  Partial small-bowel obstruction on x-ray.   HISTORY OF PRESENT ILLNESS:  [name] is a 76 year old female patient with  no significant GI history or symptoms other than diverticulosis.  She  underwent a screening colonoscopy on Friday, was found to have stable  diverticulosis without evidence of diverticulitis.  Prior to this procedure  she underwent an extensive bowel prep, including a full 64 ounces of  GoLYTELY, as well as Dulcolax suppositories.  She had significant amount of  stooling preprocedure.  Post procedure no significant pain.  She did pass a  mucoid stool in the GI lab and began to have some mild bloating post  procedure but was an unexpected finding at that time.  By Saturday she  developed significant nausea and vomiting and diffuse abdominal pain, was  unable to pass flatus but was not significantly bloated.  Sunday, her nausea  and vomiting decreased after she passed a moderate to large-sized clear  mucoid BM.  Her family came by to visit with her and were concerned for her  symptoms so asked her to present to the ER.  X-ray was done on Sunday,  October 02, 2005, which demonstrated a loop of distended small bowel in the  right upper quadrant.  She also had air in her colon.  An NG tube has been  inserted and this demonstrates thick dark yellowish return to about 400 to  500 mL since she has been admitted to 5700.  CT of the abdomen and pelvis  have been ordered today after repeat abdominal films showed no  improvement  in this partial small bowel obstruction pattern.  Surgical evaluation has  been requested.   REVIEW OF SYSTEMS:  The patient relates bilious to black type emesis back  home prior to coming in.  She has no prior history of bleeding from her  diverticular disease.  No reflux or abdominal pain prior to Friday and she  underwent a recent physical exam with her primary care physician and was  deemed in good health.   PAST MEDICAL HISTORY:  1. Diverticulosis.  2. Hypothyroidism.  3. Asthma.   PAST SURGICAL HISTORY:  1. Tubal ligation.  2. Total abdominal hysterectomy.  3. Open cholecystectomy.   SOCIAL HISTORY:  No tobacco, no alcohol.  She is married.   FAMILY HISTORY:  Noncontributory.   ALLERGIES:  IV DYE, CODEINE AND MACROBID.   CURRENT MEDICATIONS:  1. Synthroid on hold.  2. Theophylline on hold.  3. Albuterol inhaler.  4. Atrovent inhaler.  5. IV  fluids.  6. Phenergan for nausea.  7. Dilaudid for pain.   PHYSICAL EXAMINATION:  GENERAL:  Pleasant female patient complaining of  abdominal bloating and diffuse abdominal pain but much improved as compared  to prior to admission.  VITAL SIGNS:  Temperature 97.3, blood pressure 110/63, pulse 75 and regular,  respirations 14.  NEURO:  The patient is alert and oriented x3 moving all extremities x4.  No  focal deficits.  HEENT:  Head normocephalic.  Sclerae are noninjected.  NECK:  Supple.  No adenopathy.  CHEST:  Bilateral lung sounds are clear to auscultation.  Respiratory effort  nonlabored.  CARDIAC:  S1, S2.  No rubs, murmurs, gallops.  ABDOMEN:  Soft, slightly distended.  Bowel sounds are present but  diminished.  She is mildly tender without guarding or rebound.  She is  mildly tender without guarding or rebound over the left mid quadrant.  She  has an NG per left naris to low wall suction draining dark bilious return.  EXTREMITIES: Symmetrical in appearance without edema, cyanosis or clubbing.  Pulses  are palpable.   LABORATORY DATA:  White count on admission 19,200, is now down to 15,600.  Hemoglobin 12.9, platelets 311,000.  Amylase 63, lipase 22, sodium 132,  potassium 3.9, CO2 28, BUN 25, creatinine 0.8.  Glucose 125.   DIAGNOSTICS:  Two view abdomen films have been obtained serially on October 02, 2005, and October 03, 2005.  Does demonstrate a persistent dilated loop  of small bowel in the right upper quadrant with air in the colon, no  definite free air.   IMPRESSION:  1. Partial small bowel obstruction.  2. Leukocytosis improved.   PLAN:  Agree with bowel rest, IV fluids and NG tube to low wall suction for  bowel decompression.  Agree with CT of the abdomen and pelvis.  Will follow  up on this later.  Reevaluate in 24-48 hours.  Check a CBC in the morning  and followup on electrolyte panel since the patient has NG suction.  Dr.  Grandville [name] has intervened and examined the patient and agrees with the above  findings.      [name], N.P.    ______________________________  Dr. Grandville [name]   ALE/MEDQ  D:  10/03/2005  T:  10/04/2005  Job:  [id number]   cc:   Margaret Clark A. Loanne Drilling, MD

## 2010-06-25 NOTE — Assessment & Plan Note (Signed)
[facility]                            CARDIOLOGY OFFICE NOTE   [name], [name]                         MRN:          [id number]  DATE:05/09/2006                            DOB:          1934/07/23    [name] is a very pleasant 76 year old white female with atypical  chest pain, dyspnea on exertion, who was seen previously with borderline  stress Cardiolite and a negative stress echo. The patient states that  this past summer after colonoscopy, which revealed diverticulosis, she  developed small bowel obstruction and had to have partial resection of  the small bowel. She states that she has now recovered from that however  remaining quite weak, exhausted with chest pain and shortness of breath.  She had been seen by Dr. Melvyn Novas who is quite concerned about the fact that  she is taking theophylline. Also, seen by Dr. Loanne Drilling. Dr. Melvyn Novas is to  see her next week.   MEDICATIONS:  1. Aspirin 81.  2. Symbicort 80/4.5 b.i.d.  3. Theophylline 50 b.i.d.  4. Levothyroxine 50.   Blood pressure 124/76. Pulse 80 and normal sinus rhythm.  GENERAL APPEARANCE: Unremarkable and in no distress.  JVP is not  elevated. Carotid pulse palpable without bruits.  LUNGS:  Clear.  CARDIAC: Reveals no murmur, gallop, rub.  ABDOMEN: Normal.  EXTREMITIES: Normal.   DIAGNOSES:  1. Atypical chest pain with shortness of breath.  2. History of small bowel obstruction requiring partial resection.  3. Asthma.   I have suggested that the patient followup with Dr. Melvyn Novas and discontinue  the theophylline as he recommended.   Although I think her symptoms are quite atypical, I have suggested a  followup stress echo. Should note that her EKG was normal sinus rhythm  minor nonspecific ST changes.     Signa Kell, MD, [facility]  Electronically Signed    EJL/MedQ  DD: 05/09/2006  DT: 05/09/2006  Job #: [id number]

## 2010-06-25 NOTE — Assessment & Plan Note (Signed)
[facility]                             PULMONARY OFFICE NOTE   [name], [name]                         MRN:          [id number]  DATE:03/22/2006                            DOB:          [birth year]    PULMONARY EXTENDED FOLLOWUP OFFICE VISIT   HISTORY:  A 76 year old white female with longstanding asthma on an  unusual regimen of theophylline combined with p.r.n. albuterol (see last  concerns expressed in 2006 regarding this combination with PFTs showing  only very mild airflow obstruction dated October 22, 2003 and no PFTs  repeated since that time because the patient declined). I am not sure  who has been refilling her theophylline for the last several years, but  she came in today for evaluation complaining of worsening dyspnea in the  last two weeks with increasing need for albuterol and also overt  heartburn symptoms for which she has found by trial and error the only  thing works is Maalox or Mylanta. She is intolerant of PPI and H2  blockers. She denies any overt sinus complaints, purulent sputum  production, fevers, chills, sweats, orthopnea, PND or leg swelling,  exertional or pleuritic chest pain.   PHYSICAL EXAMINATION:  She is an anxious white female who has difficult  time processing questions and answering in a straightforward manner. She  is afebrile with normal vital signs.  HEENT: Is unremarkable. Oropharynx is clear.  LUNGS: Lung fields are actually clear bilaterally to auscultation and  percussion.  HEART: Regular rate and rhythm without murmur, gallop or rub.  ABDOMEN: Soft, benign.  EXTREMITIES: Warm without calf tenderness, cyanosis, clubbing or edema.   IMPRESSION:  Poorly controlled asthma in a patient who has overt  heartburn symptoms, but cannot take any form of PPI or H2 blocker. I am  actually skeptical regarding her intolerance to these medications, but  at the same time I not willing to continue such an unusual  regimen for  asthma, since it may promote reflux.   I spent extra time therefore educating her regarding diet issues for  asthma in the form of a specific flyer that we give patients for reflux.   I also encouraged her to use Maalox and Mylanta as much as necessary to  control symptoms. At this point, I asked her to stop theophylline as  well as albuterol and consolidate her therapy to Symbicort 80/4.5 two  puffs b.i.d.   I spent almost 30 minutes with this patient today going over these  issues in the detail I thought necessary to make sure she understood the  purpose behind this change. I emphasized the longterm implications of  poor control of airway inflammation and also the risk of worsening  reflux in terms of asthma management and also in terms of obvious  symptoms that she is having and note that this should improve on  Symbicort. Otherwise, she will need to be considered for promotility  drug like Reglan and/or referral back to GI, but certainly not while  still on theophylline, which should no longer be necessary to control  the asthmatic  component to her problem.   I also spent extra time making sure she could use MDI effectively.   I would like to see her back in 6 weeks for followup if she will agree  to return, but will no longer be willing to refill theophylline and I  explained why this step was necessary.     Margaret Deem. Melvyn Novas, MD, [facility]  Electronically Signed    MBW/MedQ  DD: 03/22/2006  DT: 03/22/2006  Job #: [id number]

## 2010-06-25 NOTE — H&P (Signed)
NAMEANDREIA, GANDOLFI NO.:  [id number]   MEDICAL RECORD NO.:  [id number]          PATIENT TYPE:  INP   LOCATION:  1825                         FACILITY:  [facility]   PHYSICIAN:  Sandy Salaam. Deatra Ina, MD,FACGDATE OF BIRTH:  Apr 26, 1934   DATE OF ADMISSION:  10/02/2005  DATE OF DISCHARGE:                                HISTORY & PHYSICAL   PROBLEM:  Abdominal  pain, nausea and vomiting.   [name] is a 76 year old white female complaining of abdominal pain with  nausea and vomiting.  Two days ago, she underwent a screening colonoscopy.  Left-sided diverticulosis was seen.  Following her procedure, she developed  diffuse abdominal pain and has since had persistent pain with nausea and  vomiting.  She has passed only mucus per rectum.  She was seen in the ER  where an acute abdominal series showed dilated loops of small bowel with  air/fluid levels consistent with a small-bowel obstruction.   PAST MEDICAL HISTORY:  Pertinent for hypothyroidism.  She has asthma.  She  is status post cholecystectomy, TAH and tubal ligation.   FAMILY HISTORY:  Pertinent for mother who had liver and pancreatic cancer.  Father had a bladder cancer.   MEDICATIONS:  1. Levothyroxine 50 mcg a day.  2. Theophylline 100 mcg a day.  3. Baby aspirin.   ALLERGIES:  She is allergic to IVP DYE, IODINE, and MACROBID.   SOCIAL HISTORY:  She neither smokes nor drinks.  She is married.   REVIEW OF SYSTEMS:  Was reviewed and is positive for occasional pyrosis.   PHYSICAL EXAMINATION:  VITAL SIGNS: Pulse 98, respiratory rate 18, afebrile.  HEENT:  She has slightly dry mucous membranes.  She is anicteric.  CHEST: Clear.  CARDIAC: No murmurs, gallops, or rubs.  ABDOMEN:  Bowel is minimally distended.  Bowel sounds are active.  There is  mild but diffuse tenderness without guarding or rebound.  There are no  abdominal masses or organomegaly.   LABORATORY DATA:  Electrocardiogram within normal limits.   Glucose 126, BUN  31, creatinine 0.8.  LFTs are normal.  Lipase normal.  Hemoglobin 14,  hematocrit 41.9, white count 19.2.   Acute abdominal series demonstrates findings consistent with a small-bowel  obstruction.   IMPRESSION:  Small-bowel obstruction.   Symptoms are temporally related to her colonoscopy, though I am uncertain  how a colonoscopy could precipitate a small-bowel obstruction.  It is  unlikely that she has a colonic perforation in the absence of free air.  A  paralytic ileus is a less likely consideration.   RECOMMENDATIONS:  1. NG tube decompression.  2. IV hydration.  3. Followup abdominal series in the a.m.  4. If the patient is not improved, I would consider CT of the abdomen and      pelvis.      Sandy Salaam. Deatra Ina, MD,FACG  Electronically Signed     RDK/MEDQ  D:  10/02/2005  T:  10/02/2005  Job:  [id number]   cc:   Docia Chuck. Geri Seminole., MD  Jacelyn Pi. Loanne Drilling, MD

## 2010-06-25 NOTE — Assessment & Plan Note (Signed)
[facility]                             PULMONARY OFFICE NOTE   [name]                         MRN:          [id number]  DATE:05/11/2006                            DOB:          09-Mar-1934    PULMONARY EXTENDED ACUTE OFFICE EVALUATION:   HISTORY:  A 76 year old white female who was seen on February 13  complaining of increasing dyspnea with a perceived need for albuterol  and wanted albuterol called in, but I declined.  She was also having  overt heartburn symptoms, which seemed a little bit better on Maalox and  Mylanta, having proven herself intolerant to multiple PPI and H2  blockers.  She states she really has not been her normal self since she  underwent surgery in August 2007, mostly related to overt heartburn  symptoms, which actually improved when I placed her on a diet on the  last visit.   I had recommended a trial of Symbicort and asked her to stop  theophylline, because of the overt heartburn symptoms, on her last  visit.  She stated initially that made her feel somewhat better but  states now that within 24 hours she thinks she began to have side  effects (note that I saw her on February 13; when she saw the nurse  practitioner on February 13 she only was there because she wanted to  get her Symbicort refilled).  She tells me now that she does not think  Symbicort was helping; in fact, she thinks it was hurting her and making  her ache all over.  She stopped it 2 weeks ago with no worsening dyspnea  and no improvement in any of her aches and pains.   When I interviewed her, I had the sense that she jumped from one topic  to another almost as a flight of ideas, talking a mile a minute, and  not able to focus on any of the questions I asked without interjecting  medical diagnoses, which she does not appear to understand.   PHYSICAL EXAMINATION:  GENERAL:  She is a depressed, anxious-appearing  white female in no acute  distress.  VITAL SIGNS:  Stable vital signs.  HEENT:  Unremarkable.  Pharynx clear.  LUNGS:  Lung fields are perfectly clear bilaterally to auscultation and  percussion.  CARDIAC:  Regular rate and rhythm without murmur, gallop, or rub.  ABDOMEN:  Soft, benign.  EXTREMITIES:  Warm without calf tenderness, cyanosis or clubbing.   Hemoglobin saturation 96% on room air.   Lab studies were reviewed from February 24, 2006, indicate a hematocrit  of 36.  Sedimentation rate of 33.  Normal iron studies.  Normal D-dimer.  She stated that she has been told she always has eosinophils in her  blood.  The record indicated that her baseline eosinophil level was 0.  During the hospitalization when she said she had allergies, it climbed  to as high as 16% but was headed downward by the time of discharge.   IMPRESSION:  This patient appears to have an extreme intolerance of  medications, some of  which may be allergic, but it is very difficult to  sort through this based on her convoluted history.  In this setting,  less is probably more in terms of medical therapy, but I am concerned  about the following issues:   1. Many of her symptoms presently that are pseudo-asthma in nature      could very well be reflux-related and were caused by theophylline,      which she has now stopped.  Rather than accept defeat in terms of      treating reflux aggressively, I believe she should see a GI      physician for other options.  I did emphasize that diet      restrictions were key and also that she should stay off      theophylline.  2. I do not believe that she should be treated for asthma chronically      unless we can prove she actually has asthma.  Her pulmonary      function tests do not show convincing evidence of this but a      methacholine challenge test has never been done.  I would recommend      one be done.  In the meantime I offered Xopenex, the most specific      beta agonist available, to be  used on a p.r.n. basis only for      emergencies.   This patient had a great difficulty processing the questions I asked and  the instructions I had for her and was dwelling on medical diagnoses  that were, I believe, intended as working diagnoses, not absolute  diagnoses.  For that reason I do not believe we will be able to follow  her here in the pulmonary clinic unless there is a specific pulmonary  diagnosis that we can identify.  I sense that she has a tendency to  polysomatization, anxiety and depression, which will be ripe for  overdiagnosis and overtreatment and then adverse drug effect from the  medications that are offered and that this pattern will continue  indefinitely.  This is best sorted out through primary care rather than  a specialty clinic.  I would be happy to see her back here, however, on  an emergency basis for any breathing difficulties.     Christena Deem. Melvyn Novas, MD, [facility]  Electronically Signed    MBW/MedQ  DD: 05/11/2006  DT: 05/11/2006  Job #: [id number]   cc:   Hilliard Clark A. Loanne Drilling, MD

## 2010-06-25 NOTE — Discharge Summary (Signed)
NAMEROSABEL, SERMENO NO.:  [id number]   MEDICAL RECORD NO.:  [id number]          PATIENT TYPE:  INP   LOCATION:  1884                         FACILITY:  [facility]   PHYSICIAN:  Earnstine Regal, MD      DATE OF BIRTH:  Jul 21, 1934   DATE OF ADMISSION:  10/02/2005  DATE OF DISCHARGE:  10/13/2005                                 DISCHARGE SUMMARY   CHIEF COMPLAINT/REASON FOR ADMISSION:  Ms. Sease is a 76 year old female  patient who was having abdominal pain with nausea and vomiting.  Two days  prior to admission she had undergone a screening colonoscopy.  She was found  to have left-sided diverticulosis.  Following the procedure, she developed  diffuse abdominal pain that has been constant and associated with nausea and  vomiting.  She has only been passing mucous per rectum.  The patient  presented to the ER at [facility] where acute abdominal  series revealed dilated loops of small bowel with air-fluid levels  consistent with a small bowel obstruction.  On initial exam, the patient's  white count was elevated 19,200, hemoglobin 14, lipase was normal.  Glucose  126, BUN 31 and creatinine 0.8.  On exam, the abdomen is minimally  distended.  Bowel sounds are active.  There was mild but diffuse tenderness  without guarding or rebounding.  The patient was admitted by Dr. Deatra Ina with  a diagnosis of small bowel obstruction.   HOSPITAL COURSE:  The patient was admitted as noted to the general floor.  An NG tube was inserted for decompression.  She was started on bowel rest  and IV hydration.  Follow-up x-rays were also requested. The follow up x-  rays did reveal a continued problem with small bowel obstruction so surgical  consultation was requested on October 03, 2005.  Dr. Grandville Silos saw the  patient.  Please refer to out consultation note for details.  By the time of  the surgical evaluation, the patient's white count had decreased to 15,600,  hemoglobin  down to 12.9 after hydration, amylase and lipase remained normal.  Sodium, potassium, BUN and creatinine all remained stable.  The patient was  continued on bowel prep and a CT scan was pending at time of initial  surgical consultation.   The CT did demonstrate high-grade small bowel obstruction, questionable  internal hernia at the level of the obstruction, also was found to be an  incidental small paraesophageal hernia.  On exam, the patient's abdomen was  moderately distended, quiet without bowel sounds and mild left lower  quadrant tenderness.  At this point, Dr. Grandville Silos had a discussion with the  patient regarding need for surgical exploration with probable lysis of  adhesions. Risks and benefits were discussed and the patient was agreeable  to proceeding.  On October 04, 2005, the patient was taken to the OR where  she was found to have a closed loop obstruction with an ischemic segment of  small bowel.  She underwent exploratory laparotomy with small bowel  resection and lysis of adhesions.  She is  in stable condition and sent to  the PACU to recover and back to her room postoperatively.   Postoperatively, the patient did relatively well.  She continued with NG  tube and IV fluids.  She was placed on Lovenox for DVT prophylaxis.  The  patient did develop problems related to a postoperative ileus.  She did not  tolerate the narcotic pain medicines because of complaints of feeling goofy,  so Toradol was added and Tylenol per rectum was added.   By postop day #3, the patient had active bowel sounds.  Abdomen was soft.  Her NG tube had been clamped for 24 hours and she had no nausea and  vomiting.  She had not passed any flatus but we started with sips of clear  liquids.   By postop day #4, her abdomen had become more quiet, incision was stable.  She also had been started on TNA va PICC line and postoperatively as well  and this was continued.   By postop day #6, the patient's  ileus appeared to be resolving.  Her white  count was normal at 8700, hemoglobin 11.  Her diet was advanced.  She began  to have bowel movements.   By postop day #7, her Foley catheter was discontinued.  The urine was  cloudy.  Urinalysis was sent.  This subsequently was positive appearance for  UTI, no culture was obtained.   On postop day #8, the patient's incision was clean, dry and intact.  She was  complaining of some clearish fluid draining from the most superior portion  of the incision, but I was unable to express any fluid with examination.  The incision was unremarkable except for staple reaction.  The staples were  discontinued and Steri-Strips applied.   By postop day #9, which was date of discharge, incision looked much better  after removal of staples.  There was no further drainage, no areas of  induration or redness as well.  The patient was tolerating regular diet and  using Tylenol for pain.  She had some initial diarrhea which has now  resolved.  She is having formed BMs.  She was complaining of some lower  pelvic pressure.  This would be consistent with the findings of UTI on the  prior urinalysis.  She was otherwise deemed appropriate for discharge home.   FINAL DISCHARGE DIAGNOSES:  1. Small bowel obstruction secondary to closed loop obstruction with      ischemic segment of small bowel.  She is status post small bowel      resection and lysis of adhesion.  2. Diverticulosis seen on recent screening colonoscopy.  3. Protein calorie malnutrition with recent TNA this admission.  4. Urinary tract infection, started on Cipro prior to discharge.  5. Asthma, stable.  6. Paraesophageal hernia, small, seen on CT scan with history of reflux      symptoms.  7. Hypothyroidism with recent medication changes preadmission.   DISCHARGE MEDICATIONS:  1. Resume home medications.  2. Cipro 250 mg b.i.d. for 5 days for bladder infection. 3. Protonix 40 minutes mg daily.  This  was started because of symptomatic      paraesophageal hernia.  4. Over-the-counter laxative of choice.  MiraLax, docusate sodium and      FiberCon have been suggested to the patient.   DIET:  No restrictions.   ACTIVITY:  Increase activity slowly.  May shower.  No lifting more than 10  pounds for the next 4 weeks.  No driving  for 1 week.   WOUND CARE:  Allow Steri-Strips to follow up.   PAIN MANAGEMENT:  Tylenol over-the-counter.   FOLLOW UP:  She has an appointment to see Dr. Grandville Silos on October 26, 2005, at 9:30 a.m. That telephone number is [phone].  She has also been  instructed to follow up with Dr. Loanne Drilling in 1 week regarding management of  her thyroid medications and to notify Dr. Loanne Drilling if her urinary tract  symptoms do not improve.  Again, we are treating her empirically with Cipro  and no culture was obtained.  She did have a Foley catheter this  hospitalization.      El Jebel Lissa Merlin, N.P.      Earnstine Regal, MD  Electronically Signed    ALE/MEDQ  D:  10/13/2005  T:  10/13/2005  Job:  [id number]   cc:   Merri Ray. Grandville Silos, M.D.  Sean A. Loanne Drilling, MD

## 2010-06-25 NOTE — Op Note (Signed)
[name], [name]                  ACCOUNT NO.:  [id number]   MEDICAL RECORD NO.:  [id number]          PATIENT TYPE:  INP   LOCATION:  5731                         FACILITY:  [facility]   PHYSICIAN:  [name], M.D.DATE OF BIRTH:  1934/09/11   DATE OF PROCEDURE:  10/04/2005  DATE OF DISCHARGE:                                 OPERATIVE REPORT   PREOPERATIVE DIAGNOSIS:  Small bowel obstruction.   POSTOPERATIVE DIAGNOSIS:  Small bowel obstruction with closed loop  obstruction with ischemic segment of small bowel.   PROCEDURE:  1. Exploratory laparotomy.  2. Lysis of adhesions.  3. Small bowel resection.   SURGEON:  [name], M.D.   ASSISTANT:  Odis Hollingshead, M.D.   ANESTHESIA:  General.   HISTORY OF PRESENT ILLNESS:  The patient is a 76 year old female who we saw  in consultation yesterday afternoon in regard to a small bowel obstruction.  She went on to undergo a CT scan of the abdomen and pelvis overnight.  This  demonstrated a high- grade small bowel obstruction and possible closed loop.  She did not improve clinically, so we are proceeding today with urgent  operation.   PROCEDURE:  Informed consent was obtained.  The patient received intravenous  antibiotics.  She was brought to the operating room.  General anesthesia was  administered.  Her abdomen was prepped and draped in sterile fashion.  A  midline incision was made from above the umbilicus down to her mid-lower  abdomen. The subcutaneous tissues were dissected down to the linea, and this  was divided and the peritoneal cavity entered under direct vision without  difficulty.  The fascia was opened for the length of the incision.  Exploration revealed some dilated proximal small bowel with an adhesive band  down in the left lower quadrant.  This was taken down with blunt dissection  initially. The bowel was delivered up into the abdomen, and it was clear.  There was a loop that was trapped in a  near-closed loop obstruction with  clear band marks across the proximal and distal segment of bowel that was  about 18 cm in length.  The remainder of the bowel was delivered up into the  abdomen.  It initially had some venous congestion but pinked up.  The 18 cm  length had some ischemic changes and venous thrombosis, so the decision was  made to resect that portion.  Prior to beginning the resection we explored  the rest of the abdomen.  We ran the bowel from the ligament of Treitz which  seemed to be located as expected on her CAT scan to the right of the  midline. We ran the bowel down to the terminal ileum and cecum, and no other  obstructive points or problems were noted.  The bowel continued to pink up.   We were unable to address the patient's paraesophageal hernia at this time  as a bowel resection is needed and it would not be safe to perform repair of  the hernia and fundoplication comcomitantly.   There was a tongue of  omentum reaching down into the pelvis as well, and as  this may be a point of further obstruction we divided this distally between  Byron clamps and tied them securely.  We had excellent hemostasis.  Once  this was accomplished, we redirected our attention to the small bowel  resection.  The small bowel was divided proximally and distally with the GIA-  75 stapler.  In order to excise this 18-cm ischemic segment, the mesentery  was divided sequentially between [facility] clamps and tied securely with 2-0  silk sutures.  We had excellent hemostasis.   At this time we observed some purple discoloration of the proximal end, and  so an additional 3 cm was resected in similar fashion first dividing it with  the GIA-75 and then taking down the mesentery sequentially between [facility]  clamps and tying it securely with 2-0 silk sutures.  Excellent hemostasis  was again obtained.  The bowel was then reconnected with a side-to-side  anastomosis with the GIA-75 stapler.  A TX-60  was then used to close the  resultant enterotomy.  There was a widely patent anastomosis.  Hemostasis  was assured for the staple lines.   We changed our gloves.  The mesenteric defect was then closed with  interrupted 2-0 silk sutures.  The anastomosis remained viable, and there  was no bleeding. The abdomen was copiously irrigated.  Meticulous hemostasis  was assured.  The bowel was returned carefully to its anatomic position.  The remainder of the omentum was brought down over the small bowel and the  abdomen then closed.  We closed the fascia with 2 lengths of #0 PDS, 1  starting from each end of the incision and tied in the middle.  The  subcutaneous tissues were irrigated, and hemostasis was assured.  The skin  was closed with staples.  The sponge, needle and instrument counts were  correct.  Benzoin, Steri-Strips and sterile dressings were applied.  The  patient tolerated the procedure well without apparent complication and was  taken to the recovery room in stable condition.   Please send a copy to Dr. Silvano Rusk from Gastroenterology.      Margaret Ray Grandville [name], M.D.  Electronically Signed     BET/MEDQ  D:  10/04/2005  T:  10/05/2005  Job:  [id number]   cc:   Gatha Mayer, MD,FACG

## 2010-07-10 ENCOUNTER — Other Ambulatory Visit: Payer: Self-pay | Admitting: Endocrinology

## 2010-07-17 ENCOUNTER — Other Ambulatory Visit: Payer: Self-pay | Admitting: Endocrinology

## 2011-05-30 ENCOUNTER — Other Ambulatory Visit: Payer: Self-pay | Admitting: Endocrinology

## 2011-05-30 ENCOUNTER — Telehealth: Payer: Self-pay | Admitting: *Deleted

## 2011-05-30 DIAGNOSIS — Z1231 Encounter for screening mammogram for malignant neoplasm of breast: Secondary | ICD-10-CM

## 2011-05-30 DIAGNOSIS — D649 Anemia, unspecified: Secondary | ICD-10-CM

## 2011-05-30 DIAGNOSIS — Z0000 Encounter for general adult medical examination without abnormal findings: Secondary | ICD-10-CM

## 2011-05-30 NOTE — Telephone Encounter (Signed)
Message copied by Legrand Como on Mon May 30, 2011 11:29 AM ------      Message from: Richrd Sox      Created: Mon May 30, 2011 10:10 AM      Regarding: cpe sche        The pt scheduled her cpe and is hoping to get labs done before.  Thanks!

## 2011-05-30 NOTE — Telephone Encounter (Signed)
CPX labs placed into Epic for upcoming appointment.

## 2011-06-14 ENCOUNTER — Other Ambulatory Visit: Payer: Self-pay | Admitting: Endocrinology

## 2011-06-14 ENCOUNTER — Other Ambulatory Visit (INDEPENDENT_AMBULATORY_CARE_PROVIDER_SITE_OTHER): Payer: Medicare Other

## 2011-06-14 DIAGNOSIS — E7800 Pure hypercholesterolemia, unspecified: Secondary | ICD-10-CM

## 2011-06-14 DIAGNOSIS — D649 Anemia, unspecified: Secondary | ICD-10-CM

## 2011-06-14 DIAGNOSIS — Z0000 Encounter for general adult medical examination without abnormal findings: Secondary | ICD-10-CM

## 2011-06-14 DIAGNOSIS — Z79899 Other long term (current) drug therapy: Secondary | ICD-10-CM

## 2011-06-14 LAB — URINALYSIS, ROUTINE W REFLEX MICROSCOPIC
Specific Gravity, Urine: 1.01 (ref 1.000–1.030)
Total Protein, Urine: NEGATIVE
Urine Glucose: NEGATIVE
Urobilinogen, UA: 0.2 (ref 0.0–1.0)

## 2011-06-14 LAB — HEPATIC FUNCTION PANEL
ALT: 17 U/L (ref 0–35)
AST: 27 U/L (ref 0–37)
Albumin: 3.9 g/dL (ref 3.5–5.2)

## 2011-06-14 LAB — BASIC METABOLIC PANEL
CO2: 27 mEq/L (ref 19–32)
GFR: 112.12 mL/min (ref >60.00)
Glucose, Bld: 82 mg/dL (ref 70–99)
Potassium: 4.6 mEq/L (ref 3.5–5.1)
Sodium: 139 mEq/L (ref 135–145)

## 2011-06-14 LAB — IBC PANEL: Transferrin: 222.3 mg/dL (ref 212.0–360.0)

## 2011-06-14 LAB — CBC WITH DIFFERENTIAL/PLATELET
Eosinophils Relative: 13.2 % — ABNORMAL HIGH (ref 0.0–5.0)
Monocytes Absolute: 0.9 10*3/uL (ref 0.1–1.0)
Monocytes Relative: 12.8 % — ABNORMAL HIGH (ref 3.0–12.0)
Neutrophils Relative %: 40.6 % — ABNORMAL LOW (ref 43.0–77.0)
Platelets: 303 10*3/uL (ref 150.0–400.0)
WBC: 7 10*3/uL (ref 4.5–10.5)

## 2011-06-14 LAB — LIPID PANEL
Cholesterol: 182 mg/dL (ref 0–200)
LDL Cholesterol: 97 mg/dL (ref 0–99)

## 2011-06-14 LAB — TSH: TSH: 0.63 u[IU]/mL (ref 0.35–5.50)

## 2011-06-16 ENCOUNTER — Ambulatory Visit (INDEPENDENT_AMBULATORY_CARE_PROVIDER_SITE_OTHER): Payer: Medicare Other | Admitting: Endocrinology

## 2011-06-16 ENCOUNTER — Encounter: Payer: Self-pay | Admitting: Endocrinology

## 2011-06-16 VITALS — BP 130/80 | HR 79 | Temp 98.1°F | Ht 68.0 in | Wt 143.0 lb

## 2011-06-16 DIAGNOSIS — I10 Essential (primary) hypertension: Secondary | ICD-10-CM

## 2011-06-16 DIAGNOSIS — Z0000 Encounter for general adult medical examination without abnormal findings: Secondary | ICD-10-CM

## 2011-06-16 MED ORDER — FLUTICASONE PROPIONATE HFA 110 MCG/ACT IN AERO: INHALATION_SPRAY | RESPIRATORY_TRACT | Status: DC

## 2011-06-16 MED ORDER — LEVOTHYROXINE SODIUM 75 MCG PO TABS: ORAL_TABLET | ORAL | Status: DC

## 2011-06-16 NOTE — Progress Notes (Signed)
Subjective:    Patient ID: [name], female    DOB: 11-13-1934, 77 y.o.   MRN: [id number]  HPI here for regular wellness examination.  He's feeling pretty well in general, and says chronic med probs are stable. Past Medical History  Diagnosis Date  . HYPOTHYROIDISM 09/21/2006  . HYPERCHOLESTEROLEMIA 06/05/2009  . ANEMIA-NOS 09/21/2006  . Somatization disorder 04/25/2007  . HYPERTENSION 09/21/2006  . ALLERGIC RHINITIS 09/21/2006  . ASTHMA 09/21/2006  . GERD 09/21/2006  . RENAL INSUFFICIENCY 09/21/2006  . UNSPECIFIED URINARY CALCULUS 05/28/2008  . CHEST PAIN, ATYPICAL 05/28/2008  . ABDOMINAL PAIN, CHRONIC 05/28/2008  . DIVERTICULITIS, HX OF 09/21/2006  . ASYMPTOMATIC POSTMENOPAUSAL STATUS 05/28/2008  . DDD (degenerative disc disease)     Past Surgical History  Procedure Date  . Nasal sinus surgery 1967  . Dental surgery 1970  . Appendectomy 1959  . Cholecystectomy 1983  . Abdominal hysterectomy 1981  . Pulmonary thrombosis   . Tonsillectomy   . Asthma & pneumonia birth 34's  . Acute nephritis 1958    History   Social History  . Marital Status: Divorced    Spouse Name: N/A    Number of Children: N/A  . Years of Education: N/A   Occupational History  . Not on file.   Social History Main Topics  . Smoking status: Never Smoker   . Smokeless tobacco: Not on file  . Alcohol Use: No  . Drug Use: No  . Sexually Active:    Other Topics Concern  . Not on file   Social History Narrative  . No narrative on file    Current Outpatient Prescriptions on File Prior to Visit  Medication Sig Dispense Refill  . aspirin 81 MG EC tablet Take 81 mg by mouth daily.        . Ferrous Sulfate (IRON) 325 (65 FE) MG TABS Take 1 tablet by mouth as needed.      Marland Kitchen FLOVENT HFA 110 MCG/ACT inhaler 2 PUFF TWICE DAILY  12 g  10  . levothyroxine (SYNTHROID, LEVOTHROID) 75 MCG tablet TAKE 1 TABLET EVERY DAY  30 tablet  3  . Multiple Vitamin (MULTIVITAMIN) tablet Take 1 tablet by mouth daily.           Allergies  Allergen Reactions  . Nitrofurantoin     REACTION: Syncope    No family history on file.  BP 130/80  Pulse 79  Temp(Src) 98.1 F (36.7 C) (Oral)  Ht 5' 8"  (1.727 m)  Wt 143 lb (64.864 kg)  BMI 21.74 kg/m2  SpO2 98%     Review of Systems  Constitutional: Negative for fever and unexpected weight change.  HENT: Negative for hearing loss.   Eyes: Negative for visual disturbance.  Respiratory: Negative for shortness of breath.   Cardiovascular: Negative for chest pain.  Gastrointestinal: Negative for anal bleeding.  Genitourinary: Negative for hematuria.  Musculoskeletal: Negative for back pain.  Skin: Negative for rash.  Neurological: Negative for syncope.  Hematological: Does not bruise/bleed easily.  Psychiatric/Behavioral:       Depression since her son's death       Objective:   Physical Exam VS: see vs page GEN: no distress HEAD: head: no deformity eyes: no periorbital swelling, no proptosis external nose and ears are normal mouth: no lesion seen NECK: supple, thyroid is not enlarged CHEST WALL: no deformity LUNGS:  Clear to auscultation BREASTS:  No mass.  No d/c CV: reg rate and rhythm, no murmur ABD: abdomen is soft,  nontender.  no hepatosplenomegaly.  not distended.  no hernia RECTAL: normal external and internal exam.  heme neg MUSCULOSKELETAL: muscle bulk and strength are grossly normal.  no obvious joint swelling.  gait is normal and steady EXTEMITIES: no deformity.  no ulcer on the feet.  feet are of normal color and temp.  no edema PULSES: dorsalis pedis intact bilat.  no carotid bruit NEURO:  cn 2-12 grossly intact.   readily moves all 4's.  sensation is intact to touch on the feet SKIN:  Normal texture and temperature.  No rash or suspicious lesion is visible.   NODES:  None palpable at the neck PSYCH: alert, oriented x3.  Does not appear anxious nor depressed.  Lab Results  Component Value Date   WBC 7.0 06/14/2011   HGB 12.3  06/14/2011   HCT 37.9 06/14/2011   PLT 303.0 06/14/2011   GLUCOSE 82 06/14/2011   CHOL 182 06/14/2011   TRIG 93.0 06/14/2011   HDL 66.40 06/14/2011   LDLDIRECT 120.8 06/05/2009   LDLCALC 97 06/14/2011   ALT 17 06/14/2011   AST 27 06/14/2011   NA 139 06/14/2011   K 4.6 06/14/2011   CL 103 06/14/2011   CREATININE 0.6 06/14/2011   BUN 17 06/14/2011   CO2 27 06/14/2011   TSH 0.63 06/14/2011      Assessment & Plan:  Wellness visit today, with problems stable.

## 2011-06-16 NOTE — Patient Instructions (Addendum)
please consider these measures for your health:  minimize alcohol.  do not use tobacco products.  have a colonoscopy at least every 10 years from age 75.  Women should have an annual mammogram from age 1.  keep firearms safely stored.  always use seat belts.  have working smoke alarms in your home.  see an eye doctor and dentist regularly.  never drive under the influence of alcohol or drugs (including prescription drugs).  those with fair skin should take precautions against the sun. please let me know what your wishes would be, if artificial life support measures should become necessary.  it is critically important to prevent falling down (keep floor areas well-lit, dry, and free of loose objects.  If you have a cane, walker, or wheelchair, you should use it, even for short trips around the house.  Also, try not to rush).   You should have a vaccine against shingles (a painful rash which results from the  chickenpox infection which most people had many years ago).  This vaccine reduces, but does not totally eliminate the risk of shingles.  Because this is a medicare part d benefit, you should get it at a pharmacy.   Call if you decide to do the bone-density test. Please return in 1 year. (update: we discussed code status.  pt requests full code, but would not want to be started or maintained on artificial life-support measures if there was not a reasonable chance of recovery)

## 2011-06-20 ENCOUNTER — Other Ambulatory Visit: Payer: Self-pay | Admitting: *Deleted

## 2011-06-20 MED ORDER — LEVOTHYROXINE SODIUM 75 MCG PO TABS: ORAL_TABLET | ORAL | Status: DC

## 2011-06-20 NOTE — Telephone Encounter (Signed)
R'cd fax from [place] for refill of Levothyroxine for 90 day supply

## 2011-06-23 ENCOUNTER — Ambulatory Visit
Admission: RE | Admit: 2011-06-23 | Discharge: 2011-06-23 | Disposition: A | Discharge status: Home / Self Care | Payer: Medicare Other | Source: Ambulatory Visit | Attending: Endocrinology | Admitting: Endocrinology

## 2011-06-23 DIAGNOSIS — Z1231 Encounter for screening mammogram for malignant neoplasm of breast: Secondary | ICD-10-CM

## 2011-08-16 ENCOUNTER — Ambulatory Visit
Admission: RE | Admit: 2011-08-16 | Discharge: 2011-08-16 | Disposition: A | Discharge status: Home / Self Care | Payer: Medicare Other | Source: Ambulatory Visit | Attending: Endocrinology | Admitting: Endocrinology

## 2011-08-16 ENCOUNTER — Encounter: Payer: Self-pay | Admitting: Endocrinology

## 2011-08-16 ENCOUNTER — Ambulatory Visit (INDEPENDENT_AMBULATORY_CARE_PROVIDER_SITE_OTHER): Payer: Medicare Other | Admitting: Endocrinology

## 2011-08-16 ENCOUNTER — Other Ambulatory Visit (INDEPENDENT_AMBULATORY_CARE_PROVIDER_SITE_OTHER): Payer: Medicare Other

## 2011-08-16 ENCOUNTER — Ambulatory Visit (INDEPENDENT_AMBULATORY_CARE_PROVIDER_SITE_OTHER)
Admission: RE | Admit: 2011-08-16 | Discharge: 2011-08-16 | Disposition: A | Discharge status: Home / Self Care | Payer: Medicare Other | Source: Ambulatory Visit | Attending: Endocrinology | Admitting: Endocrinology

## 2011-08-16 VITALS — BP 144/78 | HR 80 | Temp 98.7°F | Ht 68.0 in | Wt 143.0 lb

## 2011-08-16 DIAGNOSIS — R102 Pelvic and perineal pain unspecified side: Secondary | ICD-10-CM

## 2011-08-16 DIAGNOSIS — N949 Unspecified condition associated with female genital organs and menstrual cycle: Secondary | ICD-10-CM

## 2011-08-16 DIAGNOSIS — O26899 Other specified pregnancy related conditions, unspecified trimester: Secondary | ICD-10-CM

## 2011-08-16 LAB — CBC WITH DIFFERENTIAL/PLATELET
Basophils Absolute: 0.1 10*3/uL (ref 0.0–0.1)
Eosinophils Absolute: 0.9 10*3/uL — ABNORMAL HIGH (ref 0.0–0.7)
Lymphocytes Relative: 31.5 % (ref 12.0–46.0)
MCHC: 33.5 g/dL (ref 30.0–36.0)
Neutrophils Relative %: 40.1 % — ABNORMAL LOW (ref 43.0–77.0)
RDW: 13.8 % (ref 11.5–14.6)

## 2011-08-16 LAB — URINALYSIS, ROUTINE W REFLEX MICROSCOPIC
Hgb urine dipstick: NEGATIVE
Ketones, ur: NEGATIVE
Leukocytes, UA: NEGATIVE
Specific Gravity, Urine: <=1.005 (ref 1.000–1.030)
Urobilinogen, UA: 0.2 (ref 0.0–1.0)

## 2011-08-16 NOTE — Progress Notes (Signed)
  Subjective:    Patient ID: [name], female    DOB: [birth year], 77 y.o.   MRN: [id number]  HPI Pt states 1 month of intermittent moderate pain at the LLQ of the abdomen/left pelvis.  No assoc brbpr. Past Medical History  Diagnosis Date  . HYPOTHYROIDISM 09/21/2006  . HYPERCHOLESTEROLEMIA 06/05/2009  . ANEMIA-NOS 09/21/2006  . Somatization disorder 04/25/2007  . HYPERTENSION 09/21/2006  . ALLERGIC RHINITIS 09/21/2006  . ASTHMA 09/21/2006  . GERD 09/21/2006  . RENAL INSUFFICIENCY 09/21/2006  . UNSPECIFIED URINARY CALCULUS 05/28/2008  . CHEST PAIN, ATYPICAL 05/28/2008  . ABDOMINAL PAIN, CHRONIC 05/28/2008  . DIVERTICULITIS, HX OF 09/21/2006  . ASYMPTOMATIC POSTMENOPAUSAL STATUS 05/28/2008  . DDD (degenerative disc disease)     Past Surgical History  Procedure Date  . Nasal sinus surgery 1967  . Dental surgery 1970  . Appendectomy 1959  . Cholecystectomy 1983  . Abdominal hysterectomy 1981  . Pulmonary thrombosis   . Tonsillectomy   . Asthma & pneumonia birth 57's  . Acute nephritis 1958    History   Social History  . Marital Status: Divorced    Spouse Name: N/A    Number of Children: N/A  . Years of Education: N/A   Occupational History  . Not on file.   Social History Main Topics  . Smoking status: Never Smoker   . Smokeless tobacco: Not on file  . Alcohol Use: No  . Drug Use: No  . Sexually Active:    Other Topics Concern  . Not on file   Social History Narrative  . No narrative on file    Current Outpatient Prescriptions on File Prior to Visit  Medication Sig Dispense Refill  . aspirin 81 MG EC tablet Take 81 mg by mouth daily.        . Ferrous Sulfate (IRON) 325 (65 FE) MG TABS Take 1 tablet by mouth as needed.      . fluticasone (FLOVENT HFA) 110 MCG/ACT inhaler 2 PUFF TWICE DAILY  12 g  11  . levothyroxine (SYNTHROID, LEVOTHROID) 75 MCG tablet TAKE 1 TABLET EVERY DAY  90 tablet  3  . Multiple Vitamin (MULTIVITAMIN) tablet Take 1 tablet by mouth daily.           Allergies  Allergen Reactions  . Nitrofurantoin     REACTION: Syncope    No family history on file.  BP 144/78  Pulse 80  Temp 98.7 F (37.1 C) (Oral)  Ht 5' 8"  (1.727 m)  Wt 143 lb (64.864 kg)  BMI 21.74 kg/m2  SpO2 96%  Review of Systems She has fatigue.  She denies diarrhea.    Objective:   Physical Exam VITAL SIGNS:  See vs page GENERAL: no distress ABDOMEN: abdomen is soft, nontender.  no hepatosplenomegaly.  not distended.  no hernia   Lab Results  Component Value Date   WBC 6.9 08/16/2011   HGB 11.8* 08/16/2011   HCT 35.1* 08/16/2011   MCV 93.2 08/16/2011   PLT 289.0 08/16/2011   (i reviewed x-ray results)    Assessment & Plan:  Pelvic pain, uncertain etiology.  new Anemia, mild. HTN.  ? Situational component

## 2011-08-16 NOTE — Patient Instructions (Addendum)
Let's check an ultrasound.  you will receive a phone call, about a day and time for an appointment. Blood and urine tests, and x-rays, are being requested for you today.  You will receive a letter with each of your test results. We'll recheck your blood pressure again in the future.

## 2011-08-17 ENCOUNTER — Telehealth: Payer: Self-pay | Admitting: *Deleted

## 2011-08-17 NOTE — Telephone Encounter (Signed)
Called pt to inform of lab results and xray results, pt informed (letter also mailed to pt).

## 2011-08-18 ENCOUNTER — Ambulatory Visit (HOSPITAL_COMMUNITY)
Admission: RE | Admit: 2011-08-18 | Discharge: 2011-08-18 | Disposition: A | Discharge status: Home / Self Care | Payer: Medicare Other | Source: Ambulatory Visit | Attending: Endocrinology | Admitting: Endocrinology

## 2011-08-18 ENCOUNTER — Other Ambulatory Visit: Payer: Self-pay | Admitting: Endocrinology

## 2011-08-18 DIAGNOSIS — N949 Unspecified condition associated with female genital organs and menstrual cycle: Secondary | ICD-10-CM | POA: Insufficient documentation

## 2011-08-18 DIAGNOSIS — R102 Pelvic and perineal pain unspecified side: Secondary | ICD-10-CM

## 2011-08-18 DIAGNOSIS — O26899 Other specified pregnancy related conditions, unspecified trimester: Secondary | ICD-10-CM

## 2011-08-18 DIAGNOSIS — R1032 Left lower quadrant pain: Secondary | ICD-10-CM | POA: Insufficient documentation

## 2011-08-18 DIAGNOSIS — Z90710 Acquired absence of both cervix and uterus: Secondary | ICD-10-CM | POA: Insufficient documentation

## 2011-09-02 ENCOUNTER — Encounter: Payer: Self-pay | Admitting: Endocrinology

## 2011-10-06 ENCOUNTER — Other Ambulatory Visit: Payer: Self-pay | Admitting: Endocrinology

## 2012-03-27 ENCOUNTER — Ambulatory Visit (INDEPENDENT_AMBULATORY_CARE_PROVIDER_SITE_OTHER): Payer: Medicare Other | Admitting: Family Medicine

## 2012-03-27 VITALS — BP 122/76 | HR 80 | Temp 98.7°F | Resp 16 | Ht 66.0 in | Wt 139.4 lb

## 2012-03-27 DIAGNOSIS — L538 Other specified erythematous conditions: Secondary | ICD-10-CM

## 2012-03-27 DIAGNOSIS — M549 Dorsalgia, unspecified: Secondary | ICD-10-CM

## 2012-03-27 DIAGNOSIS — R21 Rash and other nonspecific skin eruption: Secondary | ICD-10-CM

## 2012-03-27 DIAGNOSIS — N390 Urinary tract infection, site not specified: Secondary | ICD-10-CM

## 2012-03-27 DIAGNOSIS — L920 Granuloma annulare: Secondary | ICD-10-CM

## 2012-03-27 LAB — POCT URINALYSIS DIPSTICK
Bilirubin, UA: NEGATIVE
Blood, UA: NEGATIVE
Glucose, UA: NEGATIVE
Ketones, UA: NEGATIVE
Nitrite, UA: NEGATIVE
Protein, UA: NEGATIVE
Spec Grav, UA: 1.015
Urobilinogen, UA: 0.2
pH, UA: 7

## 2012-03-27 LAB — POCT CBC
Granulocyte percent: 48.4 %G (ref 37–80)
HCT, POC: 35.2 % — AB (ref 37.7–47.9)
Hemoglobin: 11 g/dL — AB (ref 12.2–16.2)
Lymph, poc: 3.1 (ref 0.6–3.4)
MCH, POC: 29.6 pg (ref 27–31.2)
MCHC: 31.3 g/dL — AB (ref 31.8–35.4)
MCV: 94.8 fL (ref 80–97)
MID (cbc): 1 — AB (ref 0–0.9)
MPV: 9.6 fL (ref 0–99.8)
POC Granulocyte: 3.9 (ref 2–6.9)
POC LYMPH PERCENT: 38.5 % (ref 10–50)
POC MID %: 13.1 %M — AB (ref 0–12)
Platelet Count, POC: 351 10*3/uL (ref 142–424)
RBC: 3.71 M/uL — AB (ref 4.04–5.48)
RDW, POC: 13.6 %
WBC: 8 10*3/uL (ref 4.6–10.2)

## 2012-03-27 LAB — POCT UA - MICROSCOPIC ONLY
Casts, Ur, LPF, POC: NEGATIVE
Crystals, Ur, HPF, POC: NEGATIVE
Yeast, UA: NEGATIVE

## 2012-03-27 MED ORDER — CIPROFLOXACIN HCL 250 MG PO TABS: 250.0000 mg | ORAL_TABLET | Freq: Two times a day (BID) | ORAL | Status: DC

## 2012-03-27 MED ORDER — LIDOCAINE-PRILOCAINE 2.5-2.5 % EX CREA: TOPICAL_CREAM | CUTANEOUS | Status: DC | PRN

## 2012-03-27 NOTE — Progress Notes (Signed)
[facility]:  Office Visit  Chief Complaint:  Chief Complaint  Patient presents with  . Leg Pain    spots on leg, left leg with a painful spot,   . Granuloma    pt states she has a skin disorder    HPI: [name] is a 78 y.o. female who complains of: Worsening rash sxs, primarily pain x 1 week. She has a h/o granuloma annulare but usually it is only 1-3 lesions at a time. This time there has been 8-10 lesions on her body at any given time and yesterday she could not sleep due to the pain. The areas are painful , they are not draining or looking infected.  Unusual day for her, she is also having pain in her back, abd pain, which are all chronic but today slightly worse.  Lots of drug allergies.  Could not stand it. There are spots on both shins, chest and arms. She usually sees Dr. Jari Pigg who does cryotherapy on it. Dr. Jari Pigg. Appt with derm tomorrow at 3 pm but has sharp burning pain and needed to come in. She has had steroid creams, and antibiotics for it but the last time steroids/antifungal meds  were used she had renal dysfunction. She has lots of allergies, she has a h/o allergies/asthma. She cannot take sulfa drugs, antifungals.     Past Medical History  Diagnosis Date  . HYPOTHYROIDISM 09/21/2006  . HYPERCHOLESTEROLEMIA 06/05/2009  . ANEMIA-NOS 09/21/2006  . Somatization disorder 04/25/2007  . HYPERTENSION 09/21/2006  . ALLERGIC RHINITIS 09/21/2006  . ASTHMA 09/21/2006  . GERD 09/21/2006  . RENAL INSUFFICIENCY 09/21/2006  . UNSPECIFIED URINARY CALCULUS 05/28/2008  . CHEST PAIN, ATYPICAL 05/28/2008  . ABDOMINAL PAIN, CHRONIC 05/28/2008  . DIVERTICULITIS, HX OF 09/21/2006  . ASYMPTOMATIC POSTMENOPAUSAL STATUS 05/28/2008  . DDD (degenerative disc disease)   . Cataract    Past Surgical History  Procedure Laterality Date  . Nasal sinus surgery  1967  . Dental surgery  1970  . Appendectomy  1959  . Cholecystectomy  1983  . Abdominal hysterectomy  1981   . Pulmonary thrombosis    . Tonsillectomy    . Asthma & pneumonia birth  27's  . Acute nephritis  1958  . Tubal ligation    . Cesarean section    . Colon surgery     History   Social History  . Marital Status: Divorced    Spouse Name: N/A    Number of Children: N/A  . Years of Education: N/A   Social History Main Topics  . Smoking status: Never Smoker   . Smokeless tobacco: None  . Alcohol Use: No  . Drug Use: No  . Sexually Active: None   Other Topics Concern  . None   Social History Narrative  . None   History reviewed. No pertinent family history. Allergies  Allergen Reactions  . Nitrofurantoin     REACTION: Syncope   Prior to Admission medications   Medication Sig Start Date End Date Taking? Authorizing Provider  aspirin 81 MG EC tablet Take 81 mg by mouth daily.     Yes Historical Provider, MD  fluticasone (FLOVENT HFA) 110 MCG/ACT inhaler 2 PUFF TWICE DAILY 06/16/11  Yes Renato Shin, MD  levothyroxine (SYNTHROID, LEVOTHROID) 75 MCG tablet TAKE 1 TABLET EVERY DAY 06/20/11  Yes Renato Shin, MD  Ferrous Sulfate (IRON) 325 (65 FE) MG TABS Take 1 tablet by mouth as needed.    Historical Provider,  MD  Multiple Vitamin (MULTIVITAMIN) tablet Take 1 tablet by mouth daily.      Historical Provider, MD     ROS: The patient denies fevers, chills, night sweats, unintentional weight loss, chest pain, palpitations, wheezing, dyspnea on exertion, nausea, vomiting, dysuria, hematuria, melena, numbness, weakness, or tingling.   All other systems have been reviewed and were otherwise negative with the exception of those mentioned in the HPI and as above.    PHYSICAL EXAM: Filed Vitals:   03/27/12 1732  BP: 122/76  Pulse: 80  Temp: 98.7 F (37.1 C)  Resp: 16   Filed Vitals:   03/27/12 1732  Height: 5' 6"  (1.676 m)  Weight: 139 lb 6.4 oz (63.231 kg)   Body mass index is 22.51 kg/(m^2).  General: Alert, no acute distress HEENT:  Normocephalic, atraumatic,  oropharynx patent.  Cardiovascular:  Regular rate and rhythm, no rubs murmurs or gallops.  No Carotid bruits, radial pulse intact. No pedal edema.  Respiratory: Clear to auscultation bilaterally.  No wheezes, rales, or rhonchi.  No cyanosis, no use of accessory musculature GI: No organomegaly, abdomen is soft and non-tender, positive bowel sounds.  No masses. Skin:   + erythematous maculopapular lesions ranging from 1/2 cm to 1 inch largest are on bilateral shins. She has about 8-10 of these lesions, on bil shins, arms and also chest. No current drainage but there is some crusting, tender to touch, no warmth.  Neurologic: Facial musculature symmetric. Psychiatric: Patient is appropriate throughout our interaction. Lymphatic: No cervical lymphadenopathy Musculoskeletal: Gait intact.   LABS: Results for orders placed in visit on 03/27/12  POCT CBC      Result Value Range   WBC 8.0  4.6 - 10.2 K/uL   Lymph, poc 3.1  0.6 - 3.4   POC LYMPH PERCENT 38.5  10 - 50 %L   MID (cbc) 1.0 (*) 0 - 0.9   POC MID % 13.1 (*) 0 - 12 %M   POC Granulocyte 3.9  2 - 6.9   Granulocyte percent 48.4  37 - 80 %G   RBC 3.71 (*) 4.04 - 5.48 M/uL   Hemoglobin 11.0 (*) 12.2 - 16.2 g/dL   HCT, POC 35.2 (*) 37.7 - 47.9 %   MCV 94.8  80 - 97 fL   MCH, POC 29.6  27 - 31.2 pg   MCHC 31.3 (*) 31.8 - 35.4 g/dL   RDW, POC 13.6     Platelet Count, POC 351  142 - 424 K/uL   MPV 9.6  0 - 99.8 fL  POCT UA - MICROSCOPIC ONLY      Result Value Range   WBC, Ur, HPF, POC 0-4     RBC, urine, microscopic 0-1     Bacteria, U Microscopic trace     Mucus, UA trace     Epithelial cells, urine per micros 0-2     Crystals, Ur, HPF, POC neg     Casts, Ur, LPF, POC neg     Yeast, UA neg    POCT URINALYSIS DIPSTICK      Result Value Range   Color, UA yellow     Clarity, UA clear     Glucose, UA neg     Bilirubin, UA neg     Ketones, UA neg     Spec Grav, UA 1.015     Blood, UA neg     pH, UA 7.0     Protein, UA neg      Urobilinogen, UA  0.2     Nitrite, UA neg     Leukocytes, UA Trace       EKG/XRAY:   Primary read interpreted by Dr. Marin Comment at [facility].   ASSESSMENT/PLAN: Encounter Diagnoses  Name Primary?  . Rash and nonspecific skin eruption Yes  . Back pain   . Urinary tract infection, site not specified   . Granuloma annulare    F/u with Dr. Delman Cheadle, no infection, declines cryotherapy today. She just wanted to know that her bloodwork did not show any infection. Wants lidocaine for pain. Rx Emla cream Rx Cipro 250 mg BID x 3 days, Urine cx, Labs pending Push fluids F/u prn    LE, THAO PHUONG, DO 03/27/2012 7:18 PM

## 2012-03-28 LAB — COMPREHENSIVE METABOLIC PANEL
ALT: 10 U/L (ref 0–35)
AST: 21 U/L (ref 0–37)
Albumin: 4.2 g/dL (ref 3.5–5.2)
CO2: 29 mEq/L (ref 19–32)
Calcium: 10.2 mg/dL (ref 8.4–10.5)
Chloride: 100 mEq/L (ref 96–112)
Creat: 0.75 mg/dL (ref 0.50–1.10)
Potassium: 4.2 mEq/L (ref 3.5–5.3)
Total Protein: 6.9 g/dL (ref 6.0–8.3)

## 2012-03-28 LAB — COMPREHENSIVE METABOLIC PANEL WITH GFR
Alkaline Phosphatase: 54 U/L (ref 39–117)
BUN: 15 mg/dL (ref 6–23)
Glucose, Bld: 86 mg/dL (ref 70–99)
Sodium: 137 meq/L (ref 135–145)
Total Bilirubin: 0.2 mg/dL — ABNORMAL LOW (ref 0.3–1.2)

## 2012-03-29 LAB — URINE CULTURE
Colony Count: NO GROWTH
Organism ID, Bacteria: NO GROWTH

## 2012-04-16 ENCOUNTER — Encounter: Payer: Self-pay | Admitting: Family Medicine

## 2012-04-29 ENCOUNTER — Ambulatory Visit (INDEPENDENT_AMBULATORY_CARE_PROVIDER_SITE_OTHER): Payer: Medicare Other | Admitting: Family Medicine

## 2012-04-29 VITALS — BP 133/81 | HR 76 | Temp 98.3°F | Resp 16 | Ht 65.5 in | Wt 137.0 lb

## 2012-04-29 DIAGNOSIS — R21 Rash and other nonspecific skin eruption: Secondary | ICD-10-CM

## 2012-04-29 DIAGNOSIS — R350 Frequency of micturition: Secondary | ICD-10-CM

## 2012-04-29 DIAGNOSIS — R5383 Other fatigue: Secondary | ICD-10-CM

## 2012-04-29 DIAGNOSIS — R5381 Other malaise: Secondary | ICD-10-CM

## 2012-04-29 LAB — TSH: TSH: 0.483 u[IU]/mL (ref 0.350–4.500)

## 2012-04-29 LAB — COMPREHENSIVE METABOLIC PANEL WITH GFR
Alkaline Phosphatase: 47 U/L (ref 39–117)
BUN: 13 mg/dL (ref 6–23)
Creat: 0.63 mg/dL (ref 0.50–1.10)
Glucose, Bld: 98 mg/dL (ref 70–99)
Sodium: 135 meq/L (ref 135–145)
Total Bilirubin: 0.4 mg/dL (ref 0.3–1.2)

## 2012-04-29 LAB — POCT UA - MICROSCOPIC ONLY
Casts, Ur, LPF, POC: NEGATIVE
Mucus, UA: NEGATIVE
WBC, Ur, HPF, POC: NEGATIVE
Yeast, UA: NEGATIVE

## 2012-04-29 LAB — POCT CBC
Granulocyte percent: 58.6 %G (ref 37–80)
HCT, POC: 39.2 % (ref 37.7–47.9)
Hemoglobin: 12.8 g/dL (ref 12.2–16.2)
Lymph, poc: 2.3 (ref 0.6–3.4)
MCH, POC: 30.7 pg (ref 27–31.2)
MCHC: 32.7 g/dL (ref 31.8–35.4)
MCV: 93.9 fL (ref 80–97)
MID (cbc): 0.7 (ref 0–0.9)
MPV: 8.9 fL (ref 0–99.8)
POC Granulocyte: 4.3 (ref 2–6.9)
POC LYMPH PERCENT: 31.5 %L (ref 10–50)
POC MID %: 9.9 % (ref 0–12)
Platelet Count, POC: 381 10*3/uL (ref 142–424)
RBC: 4.17 M/uL (ref 4.04–5.48)
RDW, POC: 13.2 %
WBC: 7.4 10*3/uL (ref 4.6–10.2)

## 2012-04-29 LAB — POCT URINALYSIS DIPSTICK
Bilirubin, UA: NEGATIVE
Blood, UA: NEGATIVE
Glucose, UA: NEGATIVE
Leukocytes, UA: NEGATIVE
Nitrite, UA: NEGATIVE
Urobilinogen, UA: 0.2
pH, UA: 7

## 2012-04-29 LAB — COMPREHENSIVE METABOLIC PANEL
ALT: 12 U/L (ref 0–35)
AST: 26 U/L (ref 0–37)
Albumin: 4.5 g/dL (ref 3.5–5.2)
CO2: 27 mEq/L (ref 19–32)
Calcium: 10.3 mg/dL (ref 8.4–10.5)
Chloride: 100 mEq/L (ref 96–112)
Potassium: 5.1 mEq/L (ref 3.5–5.3)
Total Protein: 7.2 g/dL (ref 6.0–8.3)

## 2012-04-29 LAB — GLUCOSE, POCT (MANUAL RESULT ENTRY): POC Glucose: 96 mg/dl (ref 70–99)

## 2012-04-29 NOTE — Progress Notes (Signed)
[facility]:  Office Visit  Chief Complaint:  Chief Complaint  Patient presents with  . Urinary Frequency    HPI: [name] is a 78 y.o. female who complains of mutliple problems:  1. She is here fro rash recheck. She has granuloma annulare. Has seen Dr. Delman Cheadle for it recently. BX completed but per patient were inconclusive. She was given clobetasol and also Bactroban,the steroid helped some, the bactroban burnt her skin. She has used the lidocaine meds that I gave her with some relief. Painful and red lesions, no pus. NO s/sx of infection. Stinging, burning pain.  2. Fatigue and urinary frequency. She has been going every hour. Sh ehas only taken tiny sips of water because feels dehydrated. 4-5 times a day. She started having increase frequency last night. No hematuira, no dysuria. No spasms.  3. Anemia-on iron, 6 years ago had colonscopy, she was oversedated. Colonoscopy is normal.   Past Medical History  Diagnosis Date  . HYPOTHYROIDISM 09/21/2006  . HYPERCHOLESTEROLEMIA 06/05/2009  . ANEMIA-NOS 09/21/2006  . Somatization disorder 04/25/2007  . HYPERTENSION 09/21/2006  . ALLERGIC RHINITIS 09/21/2006  . ASTHMA 09/21/2006  . GERD 09/21/2006  . RENAL INSUFFICIENCY 09/21/2006  . UNSPECIFIED URINARY CALCULUS 05/28/2008  . CHEST PAIN, ATYPICAL 05/28/2008  . ABDOMINAL PAIN, CHRONIC 05/28/2008  . DIVERTICULITIS, HX OF 09/21/2006  . ASYMPTOMATIC POSTMENOPAUSAL STATUS 05/28/2008  . DDD (degenerative disc disease)   . Cataract   . COPD (chronic obstructive pulmonary disease)    Past Surgical History  Procedure Laterality Date  . Nasal sinus surgery  1967  . Dental surgery  1970  . Appendectomy  1959  . Cholecystectomy  1983  . Abdominal hysterectomy  1981  . Pulmonary thrombosis    . Tonsillectomy    . Asthma & pneumonia birth  73's  . Acute nephritis  1958  . Tubal ligation    . Cesarean section    . Colon surgery     History   Social History  . Marital  Status: Divorced    Spouse Name: N/A    Number of Children: N/A  . Years of Education: N/A   Social History Main Topics  . Smoking status: Never Smoker   . Smokeless tobacco: None  . Alcohol Use: No  . Drug Use: No  . Sexually Active: No   Other Topics Concern  . None   Social History Narrative  . None   Family History  Problem Relation Age of Onset  . Cancer Mother   . Cancer Father    Allergies  Allergen Reactions  . Nitrofurantoin     REACTION: Syncope   Prior to Admission medications   Medication Sig Start Date End Date Taking? Authorizing Provider  fluticasone (FLOVENT HFA) 110 MCG/ACT inhaler 2 PUFF TWICE DAILY 06/16/11  Yes Renato Shin, MD  levothyroxine (SYNTHROID, LEVOTHROID) 75 MCG tablet TAKE 1 TABLET EVERY DAY 06/20/11  Yes Renato Shin, MD  aspirin 81 MG EC tablet Take 81 mg by mouth daily.      Historical Provider, MD  ciprofloxacin (CIPRO) 250 MG tablet Take 1 tablet (250 mg total) by mouth 2 (two) times daily. 03/27/12   Tasheika Kitzmiller P Dyesha Henault, DO  Ferrous Sulfate (IRON) 325 (65 FE) MG TABS Take 1 tablet by mouth as needed.    Historical Provider, MD  lidocaine-prilocaine (EMLA) cream Apply topically as needed. 03/27/12   Josef Tourigny P Daina Cara, DO  Multiple Vitamin (MULTIVITAMIN) tablet Take 1 tablet by mouth daily.  Historical Provider, MD     ROS: The patient denies fevers, chills, night sweats, unintentional weight loss, chest pain, palpitations, wheezing, dyspnea on exertion, nausea, vomiting, abdominal pain, dysuria, hematuria, melena, numbness, weakness, or tingling.   All other systems have been reviewed and were otherwise negative with the exception of those mentioned in the HPI and as above.    PHYSICAL EXAM: Filed Vitals:   04/29/12 1218  BP: 133/81  Pulse: 76  Temp: 98.3 F (36.8 C)  Resp: 16   Filed Vitals:   04/29/12 1218  Height: 5' 5.5" (1.664 m)  Weight: 137 lb (62.143 kg)   Body mass index is 22.44 kg/(m^2).  General: Alert, no acute  distress HEENT:  Normocephalic, atraumatic, oropharynx patent. EOMI, PERRLA, fundoscopic exam Cardiovascular:  Regular rate and rhythm, no rubs murmurs or gallops.  No Carotid bruits, radial pulse intact. No pedal edema.  Respiratory: Clear to auscultation bilaterally.  No wheezes, rales, or rhonchi.  No cyanosis, no use of accessory musculature GI: No organomegaly, abdomen is soft and non-tender, positive bowel sounds.  No masses. Skin: + rashechronic s. Neurologic: Facial musculature symmetric. Psychiatric: Patient is appropriate throughout our interaction. Lymphatic: No cervical lymphadenopathy Musculoskeletal: Gait intact.   LABS: Results for orders placed in visit on 04/29/12  POCT UA - MICROSCOPIC ONLY      Result Value Range   WBC, Ur, HPF, POC neg     RBC, urine, microscopic 0-2     Bacteria, U Microscopic neg     Mucus, UA neg     Epithelial cells, urine per micros neg     Crystals, Ur, HPF, POC neg     Casts, Ur, LPF, POC neg     Yeast, UA neg    POCT URINALYSIS DIPSTICK      Result Value Range   Color, UA yellow     Clarity, UA clear     Glucose, UA neg     Bilirubin, UA neg     Ketones, UA neg     Spec Grav, UA 1.010     Blood, UA neg     pH, UA 7.0     Protein, UA neg     Urobilinogen, UA 0.2     Nitrite, UA neg     Leukocytes, UA Negative    POCT CBC      Result Value Range   WBC 7.4  4.6 - 10.2 K/uL   Lymph, poc 2.3  0.6 - 3.4   POC LYMPH PERCENT 31.5  10 - 50 %L   MID (cbc) 0.7  0 - 0.9   POC MID % 9.9  0 - 12 %M   POC Granulocyte 4.3  2 - 6.9   Granulocyte percent 58.6  37 - 80 %G   RBC 4.17  4.04 - 5.48 M/uL   Hemoglobin 12.8  12.2 - 16.2 g/dL   HCT, POC 39.2  37.7 - 47.9 %   MCV 93.9  80 - 97 fL   MCH, POC 30.7  27 - 31.2 pg   MCHC 32.7  31.8 - 35.4 g/dL   RDW, POC 13.2     Platelet Count, POC 381  142 - 424 K/uL   MPV 8.9  0 - 99.8 fL  GLUCOSE, POCT (MANUAL RESULT ENTRY)      Result Value Range   POC Glucose 96  70 - 99 mg/dl      EKG/XRAY:   Primary read interpreted by Dr. Marin Comment at [facility].  ASSESSMENT/PLAN: Encounter Diagnoses  Name Primary?  . Urinary frequency Yes  . Other malaise and fatigue   . Rash and nonspecific skin eruption    Chronic issues Labs in house are normal. Will await for TSH, CMP Advise to call dermatologist for rash issues F/u prn   Donella Pascarella, [place], DO 04/29/2012 1:51 PM

## 2012-05-01 ENCOUNTER — Encounter: Payer: Self-pay | Admitting: Family Medicine

## 2012-05-02 ENCOUNTER — Encounter: Payer: Self-pay | Admitting: Endocrinology

## 2012-05-02 ENCOUNTER — Ambulatory Visit (INDEPENDENT_AMBULATORY_CARE_PROVIDER_SITE_OTHER): Payer: Medicare Other | Admitting: Endocrinology

## 2012-05-02 VITALS — BP 126/70 | HR 80 | Wt 136.0 lb

## 2012-05-02 DIAGNOSIS — R358 Other polyuria: Secondary | ICD-10-CM | POA: Insufficient documentation

## 2012-05-02 DIAGNOSIS — R3589 Other polyuria: Secondary | ICD-10-CM | POA: Insufficient documentation

## 2012-05-02 LAB — BASIC METABOLIC PANEL
BUN: 13 mg/dL (ref 6–23)
Calcium: 10.3 mg/dL (ref 8.4–10.5)
Chloride: 101 mEq/L (ref 96–112)
Creatinine, Ser: 0.7 mg/dL (ref 0.4–1.2)
GFR: 94.18 mL/min (ref >60.00)

## 2012-05-02 NOTE — Progress Notes (Signed)
  Subjective:    Patient ID: [name], female    DOB: 1934/03/20, 78 y.o.   MRN: [id number]  HPI Pt says her urinary frequency (of large volumes of urine) persists.  Denies dysuria.   Past Medical History  Diagnosis Date  . HYPOTHYROIDISM 09/21/2006  . HYPERCHOLESTEROLEMIA 06/05/2009  . ANEMIA-NOS 09/21/2006  . Somatization disorder 04/25/2007  . HYPERTENSION 09/21/2006  . ALLERGIC RHINITIS 09/21/2006  . ASTHMA 09/21/2006  . GERD 09/21/2006  . RENAL INSUFFICIENCY 09/21/2006  . UNSPECIFIED URINARY CALCULUS 05/28/2008  . CHEST PAIN, ATYPICAL 05/28/2008  . ABDOMINAL PAIN, CHRONIC 05/28/2008  . DIVERTICULITIS, HX OF 09/21/2006  . ASYMPTOMATIC POSTMENOPAUSAL STATUS 05/28/2008  . DDD (degenerative disc disease)   . Cataract   . COPD (chronic obstructive pulmonary disease)     Past Surgical History  Procedure Laterality Date  . Nasal sinus surgery  1967  . Dental surgery  1970  . Appendectomy  1959  . Cholecystectomy  1983  . Abdominal hysterectomy  1981  . Pulmonary thrombosis    . Tonsillectomy    . Asthma & pneumonia birth  60's  . Acute nephritis  1958  . Tubal ligation    . Cesarean section    . Colon surgery      History   Social History  . Marital Status: Divorced    Spouse Name: N/A    Number of Children: N/A  . Years of Education: N/A   Occupational History  . Not on file.   Social History Main Topics  . Smoking status: Never Smoker   . Smokeless tobacco: Not on file  . Alcohol Use: No  . Drug Use: No  . Sexually Active: No   Other Topics Concern  . Not on file   Social History Narrative  . No narrative on file    Current Outpatient Prescriptions on File Prior to Visit  Medication Sig Dispense Refill  . aspirin 81 MG EC tablet Take 81 mg by mouth daily.        . Ferrous Sulfate (IRON) 325 (65 FE) MG TABS Take 1 tablet by mouth as needed.      . fluticasone (FLOVENT HFA) 110 MCG/ACT inhaler 2 PUFF TWICE DAILY  12 g  11  . levothyroxine (SYNTHROID,  LEVOTHROID) 75 MCG tablet TAKE 1 TABLET EVERY DAY  90 tablet  3  . lidocaine-prilocaine (EMLA) cream Apply topically as needed.  30 g  0  . Multiple Vitamin (MULTIVITAMIN) tablet Take 1 tablet by mouth daily.         No current facility-administered medications on file prior to visit.    Allergies  Allergen Reactions  . Nitrofurantoin     REACTION: Syncope    Family History  Problem Relation Age of Onset  . Cancer Mother   . Cancer Father     BP 126/70  Pulse 80  Wt 136 lb (61.689 kg)  BMI 22.28 kg/m2  SpO2 96%  Review of Systems Denies excessive thirst.    Objective:   Physical Exam VITAL SIGNS:  See vs page.   GENERAL: no distress. Abd: no suprapubic tenderness.   (i reviewed ua from urgent care)    Assessment & Plan:  Polyuria, new, uncertain etiology

## 2012-05-02 NOTE — Patient Instructions (Addendum)
Please come back for a regular physical appointment in 2 months. Please measure the amount of urine you make in 24 hrs.  Please call with the amount. blood tests are being requested for you today.  We'll contact you with results.

## 2012-05-03 ENCOUNTER — Telehealth: Payer: Self-pay

## 2012-05-03 NOTE — Telephone Encounter (Signed)
Pt advised.

## 2012-05-03 NOTE — Telephone Encounter (Signed)
Pt called to let you know her urine output was 101 ounces for 24 hrs and this am it looks like she has crystals in it.

## 2012-05-03 NOTE — Telephone Encounter (Signed)
Thank you.   i'll let you know when i get test results

## 2012-05-09 LAB — ARGININE VASOPRESSIN HORMONE: Arginine Vasopressin: <1 pg/mL — ABNORMAL LOW

## 2012-05-10 ENCOUNTER — Other Ambulatory Visit: Payer: Self-pay | Admitting: Endocrinology

## 2012-05-10 MED ORDER — METHYLPREDNISOLONE (PAK) 4 MG PO TABS: ORAL_TABLET | ORAL | Status: DC

## 2012-05-11 ENCOUNTER — Other Ambulatory Visit: Payer: Self-pay

## 2012-05-24 ENCOUNTER — Other Ambulatory Visit: Payer: Self-pay

## 2012-05-24 DIAGNOSIS — Z1231 Encounter for screening mammogram for malignant neoplasm of breast: Secondary | ICD-10-CM

## 2012-06-20 ENCOUNTER — Encounter: Payer: Self-pay | Admitting: Endocrinology

## 2012-06-20 ENCOUNTER — Ambulatory Visit (INDEPENDENT_AMBULATORY_CARE_PROVIDER_SITE_OTHER): Payer: Medicare Other | Admitting: Endocrinology

## 2012-06-20 VITALS — BP 122/70 | HR 100 | Ht 66.0 in | Wt 137.0 lb

## 2012-06-20 DIAGNOSIS — D509 Iron deficiency anemia, unspecified: Secondary | ICD-10-CM

## 2012-06-20 DIAGNOSIS — D649 Anemia, unspecified: Secondary | ICD-10-CM

## 2012-06-20 DIAGNOSIS — Z0000 Encounter for general adult medical examination without abnormal findings: Secondary | ICD-10-CM

## 2012-06-20 DIAGNOSIS — R9431 Abnormal electrocardiogram [ECG] [EKG]: Secondary | ICD-10-CM

## 2012-06-20 DIAGNOSIS — R631 Polydipsia: Secondary | ICD-10-CM

## 2012-06-20 DIAGNOSIS — F54 Psychological and behavioral factors associated with disorders or diseases classified elsewhere: Secondary | ICD-10-CM

## 2012-06-20 MED ORDER — CEFUROXIME AXETIL 250 MG PO TABS: 250.0000 mg | ORAL_TABLET | Freq: Two times a day (BID) | ORAL | Status: AC

## 2012-06-20 NOTE — Patient Instructions (Addendum)
please consider these measures for your health:  minimize alcohol.  do not use tobacco products.  have a colonoscopy at least every 10 years from age 76.  Women should have an annual mammogram from age 64.  keep firearms safely stored.  always use seat belts.  have working smoke alarms in your home.  see an eye doctor and dentist regularly.  never drive under the influence of alcohol or drugs (including prescription drugs).  those with fair skin should take precautions against the sun.   it is critically important to prevent falling down (keep floor areas well-lit, dry, and free of loose objects.  If you have a cane, walker, or wheelchair, you should use it, even for short trips around the house.  Also, try not to rush).   Let's check a test called an "echo."  you will receive a phone call, about a day and time for an appointment. i have sent a prescription to your pharmacy, for an antibiotic pill.

## 2012-06-20 NOTE — Progress Notes (Signed)
Subjective:    Patient ID: [name], female    DOB: 08/02/1934, 78 y.o.   MRN: [id number]  HPI here for regular wellness examination.  He's feeling pretty well in general, and says chronic med probs are stable, except as noted below Past Medical History  Diagnosis Date  . HYPOTHYROIDISM 09/21/2006  . HYPERCHOLESTEROLEMIA 06/05/2009  . ANEMIA-NOS 09/21/2006  . Somatization disorder 04/25/2007  . HYPERTENSION 09/21/2006  . ALLERGIC RHINITIS 09/21/2006  . ASTHMA 09/21/2006  . GERD 09/21/2006  . RENAL INSUFFICIENCY 09/21/2006  . UNSPECIFIED URINARY CALCULUS 05/28/2008  . CHEST PAIN, ATYPICAL 05/28/2008  . ABDOMINAL PAIN, CHRONIC 05/28/2008  . DIVERTICULITIS, HX OF 09/21/2006  . ASYMPTOMATIC POSTMENOPAUSAL STATUS 05/28/2008  . DDD (degenerative disc disease)   . Cataract   . COPD (chronic obstructive pulmonary disease)     Past Surgical History  Procedure Laterality Date  . Nasal sinus surgery  1967  . Dental surgery  1970  . Appendectomy  1959  . Cholecystectomy  1983  . Abdominal hysterectomy  1981  . Pulmonary thrombosis    . Tonsillectomy    . Asthma & pneumonia birth  22's  . Acute nephritis  1958  . Tubal ligation    . Cesarean section    . Colon surgery      History   Social History  . Marital Status: Divorced    Spouse Name: N/A    Number of Children: N/A  . Years of Education: N/A   Occupational History  . Not on file.   Social History Main Topics  . Smoking status: Never Smoker   . Smokeless tobacco: Not on file  . Alcohol Use: No  . Drug Use: No  . Sexually Active: No   Other Topics Concern  . Not on file   Social History Narrative  . No narrative on file    Current Outpatient Prescriptions on File Prior to Visit  Medication Sig Dispense Refill  . aspirin 81 MG EC tablet Take 81 mg by mouth daily.        . Ferrous Sulfate (IRON) 325 (65 FE) MG TABS Take 1 tablet by mouth as needed.      . fluticasone (FLOVENT HFA) 110 MCG/ACT inhaler 2 PUFF TWICE  DAILY  12 g  11  . levothyroxine (SYNTHROID, LEVOTHROID) 75 MCG tablet TAKE 1 TABLET EVERY DAY  90 tablet  3  . lidocaine-prilocaine (EMLA) cream Apply topically as needed.  30 g  0  . methylPREDNIsolone (MEDROL DOSPACK) 4 MG tablet follow package directions  21 tablet  0  . Multiple Vitamin (MULTIVITAMIN) tablet Take 1 tablet by mouth daily.         No current facility-administered medications on file prior to visit.    Allergies  Allergen Reactions  . Nitrofurantoin     REACTION: Syncope    Family History  Problem Relation Age of Onset  . Cancer Mother   . Cancer Father     BP 122/70  Pulse 100  Ht 5' 6"  (1.676 m)  Wt 137 lb (62.143 kg)  BMI 22.12 kg/m2  SpO2 98%     Review of Systems  Constitutional: Negative for unexpected weight change.       Multiple chronic somatic complaints  HENT: Negative for hearing loss.   Eyes: Negative for visual disturbance.  Respiratory: Negative for shortness of breath.   Gastrointestinal: Negative for anal bleeding.  Endocrine: Negative for cold intolerance.  Genitourinary: Negative for hematuria.  Musculoskeletal: Negative for  back pain.  Skin: Negative for rash.  Allergic/Immunologic: Positive for environmental allergies.  Neurological: Negative for syncope.  Hematological: Does not bruise/bleed easily.  Psychiatric/Behavioral: Negative for dysphoric mood.       Objective:   Physical Exam VS: see vs page GEN: no distress NECK: supple, thyroid is not enlarged CHEST WALL: no deformity LUNGS:  Clear to auscultation BREASTS:  No mass.  No d/c CV: reg rate and rhythm, no murmur ABD: abdomen is soft, nontender.  no hepatosplenomegaly.  not distended.  no hernia GENITALIA/RECTAL: declined MUSCULOSKELETAL: muscle bulk and strength are grossly normal.  no obvious joint swelling.  gait is normal and steady EXTEMITIES: no deformity.  no ulcer on the feet.  feet are of normal color and temp.  no edema PULSES: dorsalis pedis intact  bilat.  no carotid bruit NEURO:  cn 2-12 grossly intact.   readily moves all 4's.  sensation is intact to touch on the feet SKIN:  Normal texture and temperature.  No rash or suspicious lesion is visible.   NODES:  None palpable at the neck PSYCH: alert, oriented x3.  Does not appear anxious nor depressed.       Assessment & Plan:  Wellness visit today, with problems stable, except as noted.  we discussed code status.  pt requests full code, but would not want to be started or maintained on artificial life-support measures if there was not a reasonable chance of recovery.  Hemoccult cards are not given, as we have run out.     SEPARATE EVALUATION FOLLOWS--EACH PROBLEM HERE IS NEW, NOT RESPONDING TO TREATMENT, OR POSES SIGNIFICANT RISK TO THE PATIENT'S HEALTH: HISTORY OF THE PRESENT ILLNESS: Pt states few days of moderate pain at the right ear, and assoc hearing loss. PAST MEDICAL HISTORY reviewed and up to date today REVIEW OF SYSTEMS: Denies fever and chest pain PHYSICAL EXAMINATION: VITAL SIGNS:  See vs page GENERAL: no distress head: no deformity eyes: no periorbital swelling, no proptosis external nose and ears are normal mouth: no lesion seen Right tm is red. LAB/XRAY RESULTS: i reviewed electrocardiogram IMPRESSION: Abnormal ecg, new AOM, new PLAN: See instruction page

## 2012-06-22 ENCOUNTER — Other Ambulatory Visit: Payer: Self-pay

## 2012-06-22 MED ORDER — LEVOTHYROXINE SODIUM 75 MCG PO TABS: ORAL_TABLET | ORAL | Status: DC

## 2012-06-27 ENCOUNTER — Ambulatory Visit
Admission: RE | Admit: 2012-06-27 | Discharge: 2012-06-27 | Disposition: A | Discharge status: Home / Self Care | Payer: Medicare Other | Source: Ambulatory Visit

## 2012-06-27 DIAGNOSIS — Z1231 Encounter for screening mammogram for malignant neoplasm of breast: Secondary | ICD-10-CM

## 2012-06-29 ENCOUNTER — Ambulatory Visit (HOSPITAL_COMMUNITY): Payer: Medicare Other | Attending: Endocrinology | Admitting: Radiology

## 2012-06-29 ENCOUNTER — Other Ambulatory Visit: Payer: Self-pay

## 2012-06-29 DIAGNOSIS — I080 Rheumatic disorders of both mitral and aortic valves: Secondary | ICD-10-CM | POA: Insufficient documentation

## 2012-06-29 DIAGNOSIS — J4489 Other specified chronic obstructive pulmonary disease: Secondary | ICD-10-CM | POA: Insufficient documentation

## 2012-06-29 DIAGNOSIS — R9431 Abnormal electrocardiogram [ECG] [EKG]: Secondary | ICD-10-CM

## 2012-06-29 DIAGNOSIS — E7800 Pure hypercholesterolemia, unspecified: Secondary | ICD-10-CM | POA: Insufficient documentation

## 2012-06-29 DIAGNOSIS — D649 Anemia, unspecified: Secondary | ICD-10-CM | POA: Insufficient documentation

## 2012-06-29 DIAGNOSIS — J449 Chronic obstructive pulmonary disease, unspecified: Secondary | ICD-10-CM | POA: Insufficient documentation

## 2012-06-29 DIAGNOSIS — I079 Rheumatic tricuspid valve disease, unspecified: Secondary | ICD-10-CM | POA: Insufficient documentation

## 2012-06-29 NOTE — Progress Notes (Signed)
Echocardiogram performed.  

## 2012-07-20 ENCOUNTER — Other Ambulatory Visit: Payer: Self-pay | Admitting: *Deleted

## 2012-07-20 MED ORDER — LEVOTHYROXINE SODIUM 75 MCG PO TABS: ORAL_TABLET | ORAL | Status: DC

## 2012-08-07 ENCOUNTER — Other Ambulatory Visit: Payer: Self-pay | Admitting: *Deleted

## 2012-08-07 MED ORDER — FLUTICASONE PROPIONATE HFA 110 MCG/ACT IN AERO: INHALATION_SPRAY | RESPIRATORY_TRACT | Status: DC

## 2012-10-05 ENCOUNTER — Ambulatory Visit (INDEPENDENT_AMBULATORY_CARE_PROVIDER_SITE_OTHER): Payer: Medicare Other | Admitting: Family Medicine

## 2012-10-05 VITALS — BP 122/74 | HR 92 | Temp 98.0°F | Resp 17 | Ht 64.5 in | Wt 133.0 lb

## 2012-10-05 DIAGNOSIS — R5381 Other malaise: Secondary | ICD-10-CM

## 2012-10-05 DIAGNOSIS — R5383 Other fatigue: Secondary | ICD-10-CM

## 2012-10-05 DIAGNOSIS — R350 Frequency of micturition: Secondary | ICD-10-CM

## 2012-10-05 DIAGNOSIS — M549 Dorsalgia, unspecified: Secondary | ICD-10-CM

## 2012-10-05 DIAGNOSIS — R531 Weakness: Secondary | ICD-10-CM

## 2012-10-05 DIAGNOSIS — E039 Hypothyroidism, unspecified: Secondary | ICD-10-CM

## 2012-10-05 LAB — POCT CBC
Granulocyte percent: 59.9 %G (ref 37–80)
HCT, POC: 38.7 % (ref 37.7–47.9)
Lymph, poc: 2.1 (ref 0.6–3.4)
MCH, POC: 30.2 pg (ref 27–31.2)
MCHC: 31.8 g/dL (ref 31.8–35.4)
MCV: 95 fL (ref 80–97)
MID (cbc): 0.8 (ref 0–0.9)
POC LYMPH PERCENT: 28.7 %L (ref 10–50)
Platelet Count, POC: 379 10*3/uL (ref 142–424)
RDW, POC: 13.2 %
WBC: 7.4 10*3/uL (ref 4.6–10.2)

## 2012-10-05 LAB — POCT UA - MICROSCOPIC ONLY
RBC, urine, microscopic: NEGATIVE
WBC, Ur, HPF, POC: NEGATIVE
Yeast, UA: NEGATIVE

## 2012-10-05 LAB — POCT URINALYSIS DIPSTICK
Bilirubin, UA: NEGATIVE
Leukocytes, UA: NEGATIVE
Nitrite, UA: NEGATIVE
pH, UA: 7

## 2012-10-05 NOTE — Patient Instructions (Addendum)
Call Dr. Cordelia Pen office to schedule follow up appointment in the next 2 weeks. You should receive a call or letter about your lab results within the next week to 10 days.  However, if you have any acute change in your symptoms, including but not limited to chest pain, focal weakness, headache, progressive weakness, or other worsening, call 911 or go to nearest emergency room.  (Return to the clinic or go to the nearest emergency room if any of your symptoms worsen or new symptoms occur). Let me know if there are any questions.

## 2012-10-05 NOTE — Progress Notes (Signed)
Subjective:    Patient ID: [name], female    DOB: 1934/03/22, 78 y.o.   MRN: [id number]  HPI [name] is a 78 y.o. female  primary provider:ELLISON, SEAN, MD Last visit 06/20/12.    Here with multiple concerns today.   Frequent urination - going on for several years, but increased in January of this year, up to every hour, then lessens for few days. R kidney pain, since yesterday. No fever. No blood in the urine. No burning. Still with frequency, not incontinent. Tx: none recently. ? Treated with medrol dose pak for possible pituitary problems in May. Has been trying to not drink as much during the day, but replenishes at night.   Fall in December of last year. Pain in nose, but did not have injury to nose.  No headaches.  Eyes have felt weak, sore since fall of December of last year.  appt with opthalmologist - Sept 25th.  Feeling faint and weak all summer, feels worse this week, but slightly better today. No slurred speech, no facial droop, no focal weakness, feels weak all over, feeling that may pass out,  No true syncope, no seizure. Felt too tired to drive to Dr. Cordelia Pen office today, which is why she is being evaluated here.  Hx of pituitary disorder when child, adrenal issues? ? Treated with ACTH.  No chest pains/pressure. Sore with breathing - usual for her with asthma/COPD,  Hx of abnormal EKG at 06/2012 CPE,  echo ok 06/29/12: Study Conclusions - Left ventricle: The cavity size was normal. Wall thickness was normal. Systolic function was normal. The estimated ejection fraction was in the range of 50% to 55%. Wall motion was normal; there were no regional wall motion abnormalities. Doppler parameters are consistent with abnormal left ventricular relaxation (grade 1 diastolic dysfunction). - Aortic valve: Trivial regurgitation. - Mitral valve: Mild regurgitation.  ? pulmonary thrombosis about 48 years ago.   Past Medical History  Diagnosis Date  . HYPOTHYROIDISM  09/21/2006  . HYPERCHOLESTEROLEMIA 06/05/2009  . ANEMIA-NOS 09/21/2006  . Somatization disorder 04/25/2007  . HYPERTENSION 09/21/2006  . ALLERGIC RHINITIS 09/21/2006  . ASTHMA 09/21/2006  . GERD 09/21/2006  . RENAL INSUFFICIENCY 09/21/2006  . UNSPECIFIED URINARY CALCULUS 05/28/2008  . CHEST PAIN, ATYPICAL 05/28/2008  . ABDOMINAL PAIN, CHRONIC 05/28/2008  . DIVERTICULITIS, HX OF 09/21/2006  . ASYMPTOMATIC POSTMENOPAUSAL STATUS 05/28/2008  . DDD (degenerative disc disease)   . Cataract   . COPD (chronic obstructive pulmonary disease)    Past Surgical History  Procedure Laterality Date  . Nasal sinus surgery  1967  . Dental surgery  1970  . Appendectomy  1959  . Cholecystectomy  1983  . Abdominal hysterectomy  1981  . Pulmonary thrombosis    . Tonsillectomy    . Asthma & pneumonia birth  75's  . Acute nephritis  1958  . Tubal ligation    . Cesarean section    . Colon surgery     Allergies  Allergen Reactions  . Nitrofurantoin     REACTION: Syncope   Prior to Admission medications   Medication Sig Start Date End Date Taking? Authorizing Provider  aspirin 81 MG EC tablet Take 81 mg by mouth daily.     Yes Historical Provider, MD  clobetasol ointment (TEMOVATE) 0.05 % Apply topically 2 (two) times daily.   Yes Historical Provider, MD  Ferrous Sulfate (IRON) 325 (65 FE) MG TABS Take 1 tablet by mouth as needed.   Yes Historical Provider, MD  fluticasone (FLOVENT HFA) 110 MCG/ACT inhaler 2 PUFF TWICE DAILY 08/07/12  Yes Renato Shin, MD  levothyroxine (SYNTHROID, LEVOTHROID) 75 MCG tablet TAKE 1 TABLET EVERY DAY 07/20/12  Yes Renato Shin, MD  lidocaine-prilocaine (EMLA) cream Apply topically as needed. 03/27/12  Yes Thao P Le, DO  methylPREDNIsolone (MEDROL DOSPACK) 4 MG tablet follow package directions 05/10/12  Yes Renato Shin, MD  Multiple Vitamin (MULTIVITAMIN) tablet Take 1 tablet by mouth daily.     Yes Historical Provider, MD    History   Social History  . Marital Status: Divorced     Spouse Name: N/A    Number of Children: N/A  . Years of Education: N/A   Occupational History  . Not on file.   Social History Main Topics  . Smoking status: Never Smoker   . Smokeless tobacco: Not on file  . Alcohol Use: No  . Drug Use: No  . Sexual Activity: No   Other Topics Concern  . Not on file   Social History Narrative  . No narrative on file     Review of Systems  Constitutional: Positive for fatigue. Negative for fever and chills.       Intermittent hot flushes.   Eyes:       Eyes feel tired as above.   Cardiovascular: Negative for chest pain, palpitations and leg swelling.  Musculoskeletal: Positive for myalgias (back as above. ).  Neurological: Positive for weakness (generalized).       Objective:   Physical Exam  Vitals reviewed. Constitutional: She is oriented to person, place, and time. She appears well-developed and well-nourished.  HENT:  Head: Normocephalic and atraumatic.  Eyes: Conjunctivae, EOM and lids are normal. Pupils are equal, round, and reactive to light. Right eye exhibits no nystagmus. Left eye exhibits no nystagmus.  Neck: Carotid bruit is not present. No thyromegaly present.  Cardiovascular: Normal rate, regular rhythm, normal heart sounds and intact distal pulses.   Pulmonary/Chest: Effort normal and breath sounds normal.  Abdominal: Soft. Normal appearance. She exhibits no distension and no pulsatile midline mass. There is no tenderness (generalized minimal soreness, without focal area. no rebound/guarding/distension. ). There is no rebound, no guarding and no CVA tenderness (no guarding, but stated "sore all over with bilaterral CVA testing.).  Lymphadenopathy:    She has no cervical adenopathy.  Neurological: She is alert and oriented to person, place, and time. She has normal strength. She displays no atrophy and no tremor. No cranial nerve deficit or sensory deficit. She exhibits normal muscle tone. She displays a negative  Romberg sign. She displays no seizure activity. Coordination and gait normal.  No pronator drift. nonfocal exam.   Skin: Skin is warm and dry.  Psychiatric: She has a normal mood and affect. Her behavior is normal. Judgment and thought content normal.    EKG:SR, no acute findings, and no apparent change from prior in 06/16/11.   Reviewed 06/20/12 EKG - noted abnormal V2, but this is normalized on today's EKG.   Results for orders placed in visit on 10/05/12  POCT UA - MICROSCOPIC ONLY      Result Value Range   WBC, Ur, HPF, POC neg     RBC, urine, microscopic neg     Bacteria, U Microscopic neg     Mucus, UA neg     Epithelial cells, urine per micros 0-3     Crystals, Ur, HPF, POC neg     Casts, Ur, LPF, POC neg     Yeast,  UA neg    POCT URINALYSIS DIPSTICK      Result Value Range   Color, UA yellow     Clarity, UA clear     Glucose, UA neg     Bilirubin, UA neg     Ketones, UA neg     Spec Grav, UA 1.015     Blood, UA neg     pH, UA 7.0     Protein, UA neg     Urobilinogen, UA 0.2     Nitrite, UA neg     Leukocytes, UA Negative    POCT CBC      Result Value Range   WBC 7.4  4.6 - 10.2 K/uL   Lymph, poc 2.1  0.6 - 3.4   POC LYMPH PERCENT 28.7  10 - 50 %L   MID (cbc) 0.8  0 - 0.9   POC MID % 11.4  0 - 12 %M   POC Granulocyte 4.4  2 - 6.9   Granulocyte percent 59.9  37 - 80 %G   RBC 4.07  4.04 - 5.48 M/uL   Hemoglobin 12.3  12.2 - 16.2 g/dL   HCT, POC 38.7  37.7 - 47.9 %   MCV 95.0  80 - 97 fL   MCH, POC 30.2  27 - 31.2 pg   MCHC 31.8  31.8 - 35.4 g/dL   RDW, POC 13.2     Platelet Count, POC 379  142 - 424 K/uL   MPV 9.1  0 - 99.8 fL  GLUCOSE, POCT (MANUAL RESULT ENTRY)      Result Value Range   POC Glucose 88  70 - 99 mg/dl       Assessment & Plan:  [name] is a 78 y.o. female Urinary frequency - Plan: POCT UA - Microscopic Only, POCT urinalysis dipstick, EKG 12-Lead, POCT CBC, POCT glucose (manual entry), Comprehensive metabolic panel, TSH.  Recurrent  problem, and under care of PCP.  No sign of infection today. Hx of psychogenic polydipsia, but states limiting daytime fluids. Cmp pending to eval renal status.   Other malaise and fatigue, Generalized weakness - Plan: EKG 12-Lead, POCT CBC, POCT glucose (manual entry), Comprehensive metabolic panel, TSH. nonfoacl neuro exam,and no recent head injury. Hx of hypothyroidism, with prior normal TSH, but will recheck. Will also check CMP to r/o hyponatremia with prior dx fo psychogenic polydipsia. Can discuss with PCP next step - neuro vs urology.   Back pain - Possible MSK source of bilateral back pain, as U/a reassuring. Heat/ice, gentle rom,and close follow up with PCP.   Discussed patient with primary provider -Dr. Loanne Drilling on phone. No other recommendations given based on above.  Will have her call his office to schedule follow up in next 1-2 weeks, but if any acute change in symptoms, chest pain, focal weakness, headache or other worsening - to call 911 or go to ER. Understanding expressed.   Patient Instructions  Call Dr. Cordelia Pen office to schedule follow up appointment in the next 2 weeks. You should receive a call or letter about your lab results within the next week to 10 days.  However, if you have any acute change in your symptoms, including but not limited to chest pain, focal weakness, headache, progressive weakness, or other worsening, call 911 or go to nearest emergency room.  (Return to the clinic or go to the nearest emergency room if any of your symptoms worsen or new symptoms occur). Let me know if there  are any questions.

## 2012-10-06 LAB — COMPREHENSIVE METABOLIC PANEL
Alkaline Phosphatase: 48 U/L (ref 39–117)
BUN: 14 mg/dL (ref 6–23)
CO2: 29 mEq/L (ref 19–32)
Glucose, Bld: 88 mg/dL (ref 70–99)
Sodium: 134 mEq/L — ABNORMAL LOW (ref 135–145)
Total Bilirubin: 0.3 mg/dL (ref 0.3–1.2)
Total Protein: 7.1 g/dL (ref 6.0–8.3)

## 2012-10-06 LAB — TSH: TSH: 0.417 u[IU]/mL (ref 0.350–4.500)

## 2012-10-15 ENCOUNTER — Ambulatory Visit (INDEPENDENT_AMBULATORY_CARE_PROVIDER_SITE_OTHER): Payer: Medicare Other | Admitting: Endocrinology

## 2012-10-15 ENCOUNTER — Encounter: Payer: Self-pay | Admitting: Endocrinology

## 2012-10-15 VITALS — BP 132/78 | HR 90 | Ht 66.0 in | Wt 134.0 lb

## 2012-10-15 DIAGNOSIS — Z8639 Personal history of other endocrine, nutritional and metabolic disease: Secondary | ICD-10-CM

## 2012-10-15 DIAGNOSIS — E039 Hypothyroidism, unspecified: Secondary | ICD-10-CM

## 2012-10-15 DIAGNOSIS — Z862 Personal history of diseases of the blood and blood-forming organs and certain disorders involving the immune mechanism: Secondary | ICD-10-CM

## 2012-10-15 DIAGNOSIS — R21 Rash and other nonspecific skin eruption: Secondary | ICD-10-CM

## 2012-10-15 LAB — CORTISOL
Cortisol, Plasma: 9 ug/dL
Cortisol, Plasma: 21.2 ug/dL

## 2012-10-15 MED ORDER — COSYNTROPIN 0.25 MG IJ SOLR: 0.2500 mg | Freq: Once | INTRAMUSCULAR | Status: DC

## 2012-10-15 NOTE — Progress Notes (Signed)
Subjective:    Patient ID: [name], female    DOB: [birth year], 78 y.o.   MRN: [id number]  HPI Pt says she was rx'ed for adrenal insufficiency many years ago. She saw dermatol for 2-3 years of a moderate rash on the legs, and assoc pain.  Several bxs were inconclusive.  Pt says steroids made it worse, she she uses vaseline.  Her dermatol no longer takes her insurance.   Past Medical History  Diagnosis Date  . HYPOTHYROIDISM 09/21/2006  . HYPERCHOLESTEROLEMIA 06/05/2009  . ANEMIA-NOS 09/21/2006  . Somatization disorder 04/25/2007  . HYPERTENSION 09/21/2006  . ALLERGIC RHINITIS 09/21/2006  . ASTHMA 09/21/2006  . GERD 09/21/2006  . RENAL INSUFFICIENCY 09/21/2006  . UNSPECIFIED URINARY CALCULUS 05/28/2008  . CHEST PAIN, ATYPICAL 05/28/2008  . ABDOMINAL PAIN, CHRONIC 05/28/2008  . DIVERTICULITIS, HX OF 09/21/2006  . ASYMPTOMATIC POSTMENOPAUSAL STATUS 05/28/2008  . DDD (degenerative disc disease)   . Cataract   . COPD (chronic obstructive pulmonary disease)     Past Surgical History  Procedure Laterality Date  . Nasal sinus surgery  1967  . Dental surgery  1970  . Appendectomy  1959  . Cholecystectomy  1983  . Abdominal hysterectomy  1981  . Pulmonary thrombosis    . Tonsillectomy    . Asthma & pneumonia birth  65's  . Acute nephritis  1958  . Tubal ligation    . Cesarean section    . Colon surgery      History   Social History  . Marital Status: Divorced    Spouse Name: N/A    Number of Children: N/A  . Years of Education: N/A   Occupational History  . Not on file.   Social History Main Topics  . Smoking status: Never Smoker   . Smokeless tobacco: Not on file  . Alcohol Use: No  . Drug Use: No  . Sexual Activity: No   Other Topics Concern  . Not on file   Social History Narrative  . No narrative on file    Current Outpatient Prescriptions on File Prior to Visit  Medication Sig Dispense Refill  . aspirin 81 MG EC tablet Take 81 mg by mouth daily.        .  clobetasol ointment (TEMOVATE) 0.05 % Apply topically 2 (two) times daily.      . Ferrous Sulfate (IRON) 325 (65 FE) MG TABS Take 1 tablet by mouth as needed.      . fluticasone (FLOVENT HFA) 110 MCG/ACT inhaler 2 PUFF TWICE DAILY  12 g  11  . levothyroxine (SYNTHROID, LEVOTHROID) 75 MCG tablet TAKE 1 TABLET EVERY DAY  90 tablet  3  . lidocaine-prilocaine (EMLA) cream Apply topically as needed.  30 g  0  . Multiple Vitamin (MULTIVITAMIN) tablet Take 1 tablet by mouth daily.         No current facility-administered medications on file prior to visit.    Allergies  Allergen Reactions  . Nitrofurantoin     REACTION: Syncope    Family History  Problem Relation Age of Onset  . Cancer Mother   . Cancer Father     BP 132/78  Pulse 90  Ht 5' 6"  (1.676 m)  Wt 134 lb (60.782 kg)  BMI 21.64 kg/m2  SpO2 97%  Review of Systems She has mild if any itching of the legs.      Objective:   Physical Exam VITAL SIGNS:  See vs page GENERAL: no distress Legs: mild polymacular  red rash.    acth stimulation test is done: baseline cortisol level=9 then cosyntropin 250 mcg is given im 45 minutes later, cortisol level=21 (normal response)     Assessment & Plan:  Rash, persistent Apparent h/o adrenal insuff, not now.

## 2012-10-15 NOTE — Patient Instructions (Addendum)
Refer to a dermatology specialist.  you will receive a phone call, about a day and time for an appointment blood tests are being requested for you today.  We'll contact you with results.

## 2013-05-21 ENCOUNTER — Other Ambulatory Visit: Payer: Self-pay

## 2013-05-21 DIAGNOSIS — Z1231 Encounter for screening mammogram for malignant neoplasm of breast: Secondary | ICD-10-CM

## 2013-06-03 ENCOUNTER — Ambulatory Visit (INDEPENDENT_AMBULATORY_CARE_PROVIDER_SITE_OTHER): Payer: Medicare Other | Admitting: Endocrinology

## 2013-06-03 ENCOUNTER — Encounter: Payer: Self-pay | Admitting: Endocrinology

## 2013-06-03 ENCOUNTER — Telehealth: Payer: Self-pay

## 2013-06-03 VITALS — BP 126/64 | HR 84 | Temp 98.4°F | Ht 66.0 in | Wt 134.0 lb

## 2013-06-03 DIAGNOSIS — H6120 Impacted cerumen, unspecified ear: Secondary | ICD-10-CM | POA: Insufficient documentation

## 2013-06-03 NOTE — Progress Notes (Signed)
Subjective:    Patient ID: [name], female    DOB: 29-Mar-1934, 79 y.o.   MRN: [id number]  HPI Pt states a few days of slight pain at the right ear, and assoc vertigo.   Past Medical History  Diagnosis Date  . HYPOTHYROIDISM 09/21/2006  . HYPERCHOLESTEROLEMIA 06/05/2009  . ANEMIA-NOS 09/21/2006  . Somatization disorder 04/25/2007  . HYPERTENSION 09/21/2006  . ALLERGIC RHINITIS 09/21/2006  . ASTHMA 09/21/2006  . GERD 09/21/2006  . RENAL INSUFFICIENCY 09/21/2006  . UNSPECIFIED URINARY CALCULUS 05/28/2008  . CHEST PAIN, ATYPICAL 05/28/2008  . ABDOMINAL PAIN, CHRONIC 05/28/2008  . DIVERTICULITIS, HX OF 09/21/2006  . ASYMPTOMATIC POSTMENOPAUSAL STATUS 05/28/2008  . DDD (degenerative disc disease)   . Cataract   . COPD (chronic obstructive pulmonary disease)     Past Surgical History  Procedure Laterality Date  . Nasal sinus surgery  1967  . Dental surgery  1970  . Appendectomy  1959  . Cholecystectomy  1983  . Abdominal hysterectomy  1981  . Pulmonary thrombosis    . Tonsillectomy    . Asthma & pneumonia birth  93's  . Acute nephritis  1958  . Tubal ligation    . Cesarean section    . Colon surgery      History   Social History  . Marital Status: Divorced    Spouse Name: N/A    Number of Children: N/A  . Years of Education: N/A   Occupational History  . Not on file.   Social History Main Topics  . Smoking status: Never Smoker   . Smokeless tobacco: Not on file  . Alcohol Use: No  . Drug Use: No  . Sexual Activity: No   Other Topics Concern  . Not on file   Social History Narrative  . No narrative on file    Current Outpatient Prescriptions on File Prior to Visit  Medication Sig Dispense Refill  . aspirin 81 MG EC tablet Take 81 mg by mouth daily.        . clobetasol ointment (TEMOVATE) 0.05 % Apply topically 2 (two) times daily.      . Ferrous Sulfate (IRON) 325 (65 FE) MG TABS Take 1 tablet by mouth as needed.      . fluticasone (FLOVENT HFA) 110 MCG/ACT  inhaler 2 PUFF TWICE DAILY  12 g  11  . levothyroxine (SYNTHROID, LEVOTHROID) 75 MCG tablet TAKE 1 TABLET EVERY DAY  90 tablet  3  . lidocaine-prilocaine (EMLA) cream Apply topically as needed.  30 g  0  . Multiple Vitamin (MULTIVITAMIN) tablet Take 1 tablet by mouth daily.         Current Facility-Administered Medications on File Prior to Visit  Medication Dose Route Frequency Provider Last Rate Last Dose  . cosyntropin (CORTROSYN) injection 0.25 mg  0.25 mg Intravenous Once Renato Shin, MD        Allergies  Allergen Reactions  . Nitrofurantoin     REACTION: Syncope    Family History  Problem Relation Age of Onset  . Cancer Mother   . Cancer Father     BP 126/64  Pulse 84  Temp(Src) 98.4 F (36.9 C) (Oral)  Ht 5' 6"  (1.676 m)  Wt 134 lb (60.782 kg)  BMI 21.64 kg/m2  SpO2 94%   Review of Systems Denies LOC and fever.      Objective:   Physical Exam VITAL SIGNS:  See vs page GENERAL: no distress Both eac's are occluded with cerumen.  Intervention: peroxide is applied to both eac's.  Impacted cerumen is removed from the right eac, but much remains.        Assessment & Plan:  Cerumen impaction, improved Ear sxs.  uncertain if related to cerumen.

## 2013-06-03 NOTE — Telephone Encounter (Signed)
Pt will come for appointment at 1. Pt was put into the 11 slot due to supervisor being out and not being able to double book.

## 2013-06-03 NOTE — Patient Instructions (Addendum)
Please come back for a regular physical appointment in 1 month. I hope you feel better soon.  If not, please call back.  Please call sooner if you get worse.

## 2013-06-03 NOTE — Telephone Encounter (Signed)
Please advise ov this afternoon

## 2013-06-03 NOTE — Telephone Encounter (Signed)
Pt called requesting a prescription for an inner ear infection.  Please advise, Thanks!

## 2013-06-07 ENCOUNTER — Ambulatory Visit: Payer: Medicare Other | Admitting: Endocrinology

## 2013-06-21 ENCOUNTER — Encounter: Payer: Self-pay | Admitting: *Deleted

## 2013-06-21 ENCOUNTER — Ambulatory Visit (INDEPENDENT_AMBULATORY_CARE_PROVIDER_SITE_OTHER): Payer: Medicare Other | Admitting: Endocrinology

## 2013-06-21 VITALS — BP 128/70 | HR 85 | Temp 98.2°F | Ht 66.0 in | Wt 139.0 lb

## 2013-06-21 DIAGNOSIS — Z862 Personal history of diseases of the blood and blood-forming organs and certain disorders involving the immune mechanism: Secondary | ICD-10-CM

## 2013-06-21 DIAGNOSIS — R9431 Abnormal electrocardiogram [ECG] [EKG]: Secondary | ICD-10-CM

## 2013-06-21 DIAGNOSIS — I10 Essential (primary) hypertension: Secondary | ICD-10-CM

## 2013-06-21 DIAGNOSIS — N259 Disorder resulting from impaired renal tubular function, unspecified: Secondary | ICD-10-CM

## 2013-06-21 DIAGNOSIS — Z79899 Other long term (current) drug therapy: Secondary | ICD-10-CM

## 2013-06-21 DIAGNOSIS — D509 Iron deficiency anemia, unspecified: Secondary | ICD-10-CM

## 2013-06-21 DIAGNOSIS — Z0000 Encounter for general adult medical examination without abnormal findings: Secondary | ICD-10-CM

## 2013-06-21 DIAGNOSIS — Z8639 Personal history of other endocrine, nutritional and metabolic disease: Secondary | ICD-10-CM

## 2013-06-21 DIAGNOSIS — E039 Hypothyroidism, unspecified: Secondary | ICD-10-CM

## 2013-06-21 DIAGNOSIS — Z23 Encounter for immunization: Secondary | ICD-10-CM

## 2013-06-21 DIAGNOSIS — E7800 Pure hypercholesterolemia, unspecified: Secondary | ICD-10-CM

## 2013-06-21 LAB — CBC WITH DIFFERENTIAL/PLATELET
BASOS ABS: 0.1 10*3/uL (ref 0.0–0.1)
BASOS PCT: 0.8 % (ref 0.0–3.0)
EOS ABS: 1.2 10*3/uL — AB (ref 0.0–0.7)
Eosinophils Relative: 17.1 % — ABNORMAL HIGH (ref 0.0–5.0)
HCT: 30.1 % — ABNORMAL LOW (ref 36.0–46.0)
Hemoglobin: 10.1 g/dL — ABNORMAL LOW (ref 12.0–15.0)
LYMPHS PCT: 29.1 % (ref 12.0–46.0)
Lymphs Abs: 2 10*3/uL (ref 0.7–4.0)
MCHC: 33.6 g/dL (ref 30.0–36.0)
MCV: 93.4 fl (ref 78.0–100.0)
Monocytes Absolute: 0.9 10*3/uL (ref 0.1–1.0)
Monocytes Relative: 13.5 % — ABNORMAL HIGH (ref 3.0–12.0)
Neutro Abs: 2.7 10*3/uL (ref 1.4–7.7)
Neutrophils Relative %: 39.5 % — ABNORMAL LOW (ref 43.0–77.0)
Platelets: 310 10*3/uL (ref 150.0–400.0)
RBC: 3.22 Mil/uL — AB (ref 3.87–5.11)
RDW: 13.7 % (ref 11.5–15.5)
WBC: 6.8 10*3/uL (ref 4.0–10.5)

## 2013-06-21 LAB — LIPID PANEL
Cholesterol: 161 mg/dL (ref 0–200)
HDL: 61.9 mg/dL (ref >39.00)
LDL Cholesterol: 77 mg/dL (ref 0–99)
Total CHOL/HDL Ratio: 3
Triglycerides: 113 mg/dL (ref 0.0–149.0)
VLDL: 22.6 mg/dL (ref 0.0–40.0)

## 2013-06-21 LAB — URINALYSIS, ROUTINE W REFLEX MICROSCOPIC
Bilirubin Urine: NEGATIVE
Hgb urine dipstick: NEGATIVE
Ketones, ur: NEGATIVE
LEUKOCYTES UA: NEGATIVE
Nitrite: NEGATIVE
Specific Gravity, Urine: 1.01 (ref 1.000–1.030)
Total Protein, Urine: NEGATIVE
UROBILINOGEN UA: 0.2 (ref 0.0–1.0)
Urine Glucose: NEGATIVE
pH: 7.5 (ref 5.0–8.0)

## 2013-06-21 LAB — HEPATIC FUNCTION PANEL
ALBUMIN: 3.8 g/dL (ref 3.5–5.2)
ALT: 14 U/L (ref 0–35)
AST: 28 U/L (ref 0–37)
Alkaline Phosphatase: 35 U/L — ABNORMAL LOW (ref 39–117)
Bilirubin, Direct: 0 mg/dL (ref 0.0–0.3)
Total Bilirubin: 0.4 mg/dL (ref 0.2–1.2)
Total Protein: 6.4 g/dL (ref 6.0–8.3)

## 2013-06-21 LAB — BASIC METABOLIC PANEL
BUN: 22 mg/dL (ref 6–23)
CALCIUM: 9.5 mg/dL (ref 8.4–10.5)
CO2: 26 mEq/L (ref 19–32)
Chloride: 103 mEq/L (ref 96–112)
Creatinine, Ser: 0.7 mg/dL (ref 0.4–1.2)
GFR: 92.25 mL/min (ref >60.00)
Glucose, Bld: 80 mg/dL (ref 70–99)
Potassium: 4.4 mEq/L (ref 3.5–5.1)
SODIUM: 135 meq/L (ref 135–145)

## 2013-06-21 LAB — IBC PANEL
Iron: 44 ug/dL (ref 42–145)
Saturation Ratios: 15 % — ABNORMAL LOW (ref 20.0–50.0)
Transferrin: 209.9 mg/dL — ABNORMAL LOW (ref 212.0–360.0)

## 2013-06-21 LAB — HEMOGLOBIN A1C: Hgb A1c MFr Bld: 5.6 % (ref 4.6–6.5)

## 2013-06-21 LAB — TSH: TSH: 0.11 u[IU]/mL — AB (ref 0.35–4.50)

## 2013-06-21 MED ORDER — LEVOTHYROXINE SODIUM 50 MCG PO TABS: 50.0000 ug | ORAL_TABLET | Freq: Every day | ORAL | Status: DC

## 2013-06-21 NOTE — Patient Instructions (Addendum)
here are some tests for blood in the bowels.  please follow the instructions, and return to the lab. please consider these measures for your health:  minimize alcohol.  do not use tobacco products.  have a colonoscopy at least every 10 years from age 77.  Women should have an annual mammogram from age 44.  keep firearms safely stored.  always use seat belts.  have working smoke alarms in your home.  see an eye doctor and dentist regularly.  never drive under the influence of alcohol or drugs (including prescription drugs).  those with fair skin should take precautions against the sun. it is critically important to prevent falling down (keep floor areas well-lit, dry, and free of loose objects.  If you have a cane, walker, or wheelchair, you should use it, even for short trips around the house.  Also, try not to rush).   Please return in 1 year.

## 2013-06-21 NOTE — Progress Notes (Signed)
Subjective:    Patient ID: [name], female    DOB: [birth year], 79 y.o.   MRN: [id number]  HPI Pt is here for regular wellness examination, and is feeling pretty well in general, and says chronic med probs are stable, except as noted below.  She declines dexa Past Medical History  Diagnosis Date  . HYPOTHYROIDISM 09/21/2006  . HYPERCHOLESTEROLEMIA 06/05/2009  . ANEMIA-NOS 09/21/2006  . Somatization disorder 04/25/2007  . HYPERTENSION 09/21/2006  . ALLERGIC RHINITIS 09/21/2006  . ASTHMA 09/21/2006  . GERD 09/21/2006  . RENAL INSUFFICIENCY 09/21/2006  . UNSPECIFIED URINARY CALCULUS 05/28/2008  . CHEST PAIN, ATYPICAL 05/28/2008  . ABDOMINAL PAIN, CHRONIC 05/28/2008  . DIVERTICULITIS, HX OF 09/21/2006  . ASYMPTOMATIC POSTMENOPAUSAL STATUS 05/28/2008  . DDD (degenerative disc disease)   . Cataract   . COPD (chronic obstructive pulmonary disease)     Past Surgical History  Procedure Laterality Date  . Nasal sinus surgery  1967  . Dental surgery  1970  . Appendectomy  1959  . Cholecystectomy  1983  . Abdominal hysterectomy  1981  . Pulmonary thrombosis    . Tonsillectomy    . Asthma & pneumonia birth  70's  . Acute nephritis  1958  . Tubal ligation    . Cesarean section    . Colon surgery      History   Social History  . Marital Status: Divorced    Spouse Name: N/A    Number of Children: N/A  . Years of Education: N/A   Occupational History  . Not on file.   Social History Main Topics  . Smoking status: Never Smoker   . Smokeless tobacco: Not on file  . Alcohol Use: No  . Drug Use: No  . Sexual Activity: No   Other Topics Concern  . Not on file   Social History Narrative  . No narrative on file    Current Outpatient Prescriptions on File Prior to Visit  Medication Sig Dispense Refill  . aspirin 81 MG EC tablet Take 81 mg by mouth daily.        . Ferrous Sulfate (IRON) 325 (65 FE) MG TABS Take 1 tablet by mouth as needed.      . fluticasone (FLOVENT HFA) 110  MCG/ACT inhaler 2 PUFF TWICE DAILY  12 g  11  . Multiple Vitamin (MULTIVITAMIN) tablet Take 1 tablet by mouth daily.        . clobetasol ointment (TEMOVATE) 0.05 % Apply topically 2 (two) times daily.      Marland Kitchen lidocaine-prilocaine (EMLA) cream Apply topically as needed.  30 g  0   No current facility-administered medications on file prior to visit.    Allergies  Allergen Reactions  . Nitrofurantoin     REACTION: Syncope    Family History  Problem Relation Age of Onset  . Cancer Mother   . Cancer Father     BP 128/70  Pulse 85  Temp(Src) 98.2 F (36.8 C) (Oral)  Ht 5' 6"  (1.676 m)  Wt 139 lb (63.05 kg)  BMI 22.45 kg/m2  SpO2 95%  Review of Systems  Constitutional: Negative for fever.  HENT: Negative for hearing loss.   Eyes: Negative for visual disturbance.  Respiratory: Negative for shortness of breath.   Cardiovascular: Negative for chest pain.  Gastrointestinal: Negative for anal bleeding.  Endocrine: Negative for cold intolerance.  Genitourinary: Negative for hematuria.  Musculoskeletal: Negative for back pain.  Skin: Negative for wound.  Neurological: Negative for syncope.  Hematological: Does not bruise/bleed easily.  Psychiatric/Behavioral: Negative for dysphoric mood.       Objective:   Physical Exam VS: see vs page GEN: no distress HEAD: head: no deformity eyes: no periorbital swelling, no proptosis external nose and ears are normal mouth: no lesion seen CHEST WALL: no deformity LUNGS:  Clear to auscultation CV: reg rate and rhythm, no murmur ABD: abdomen is soft, nontender.  no hepatosplenomegaly.  not distended.  no hernia.  MUSCULOSKELETAL: muscle bulk and strength are grossly normal.  no obvious joint swelling.  gait is normal and steady EXTEMITIES: no deformity.  no ulcer on the feet.  feet are of normal color and temp.  no edema.  Right great toenail is absent. PULSES: dorsalis pedis intact bilat.  no carotid bruit NEURO:  cn 2-12 grossly  intact.   readily moves all 4's.  sensation is intact to touch on the feet SKIN:  Normal texture and temperature.  No rash or suspicious lesion is visible.   PSYCH: alert, well-oriented.  Does not appear anxious nor depressed.      Assessment & Plan:  Wellness visit today, with problems stable, except as noted. we discussed code status.  pt requests full code, but would not want to be started or maintained on artificial life-support measures if there was not a reasonable chance of recovery.     SEPARATE EVALUATION FOLLOWS--EACH PROBLEM HERE IS NEW, NOT RESPONDING TO TREATMENT, OR POSES SIGNIFICANT RISK TO THE PATIENT'S HEALTH: Pt has chronic hypothyroidism.  she takes synthroid as rx'ed.  She denies weight change HISTORY OF THE PRESENT ILLNESS: PAST MEDICAL HISTORY reviewed and up to date today REVIEW OF SYSTEMS: Denies headahce PHYSICAL EXAMINATION: VITAL SIGNS:  See vs page GENERAL: no distress NECK: There is no palpable thyroid enlargement.  No thyroid nodule is palpable.  No palpable lymphadenopathy at the anterior neck. LAB/XRAY RESULTS: Lab Results  Component Value Date   TSH 0.11* 06/21/2013   Lab Results  Component Value Date   WBC 6.8 06/21/2013   HGB 10.1* 06/21/2013   HCT 30.1* 06/21/2013   MCV 93.4 06/21/2013   PLT 310.0 06/21/2013   IMPRESSION: Hyperthyroidism: overreplaced fe-deficiency anemia: she needs increased rx PLAN: See instruction page

## 2013-07-02 ENCOUNTER — Ambulatory Visit
Admission: RE | Admit: 2013-07-02 | Discharge: 2013-07-02 | Disposition: A | Discharge status: Home / Self Care | Payer: Medicaid Other | Source: Ambulatory Visit

## 2013-07-02 DIAGNOSIS — Z1231 Encounter for screening mammogram for malignant neoplasm of breast: Secondary | ICD-10-CM

## 2013-07-04 ENCOUNTER — Other Ambulatory Visit: Payer: Medicaid Other

## 2013-07-05 ENCOUNTER — Other Ambulatory Visit (INDEPENDENT_AMBULATORY_CARE_PROVIDER_SITE_OTHER): Payer: Medicare Other

## 2013-07-05 ENCOUNTER — Other Ambulatory Visit: Payer: Self-pay

## 2013-07-05 DIAGNOSIS — D509 Iron deficiency anemia, unspecified: Secondary | ICD-10-CM

## 2013-07-05 LAB — HEMOCCULT SLIDES (X 3 CARDS)
Fecal Occult Blood: POSITIVE — AB
OCCULT 1: POSITIVE — AB
OCCULT 2: POSITIVE — AB
OCCULT 3: POSITIVE — AB
OCCULT 4: POSITIVE — AB
OCCULT 5: POSITIVE — AB

## 2013-07-07 ENCOUNTER — Other Ambulatory Visit: Payer: Self-pay | Admitting: Endocrinology

## 2013-07-07 DIAGNOSIS — D509 Iron deficiency anemia, unspecified: Secondary | ICD-10-CM

## 2013-07-22 ENCOUNTER — Telehealth: Payer: Self-pay | Admitting: Internal Medicine

## 2013-07-22 NOTE — Telephone Encounter (Signed)
Pt states she has been having problems with anemia, feeling weak. States 2 weeks ago her PCP stated her stool cards were positive. Pt requests to be seen sooner than 1st available. Pt scheduled to see Alonza Bogus PA 07/29/13@2pm . Pt aware of appt.

## 2013-07-29 ENCOUNTER — Ambulatory Visit (INDEPENDENT_AMBULATORY_CARE_PROVIDER_SITE_OTHER): Payer: PRIVATE HEALTH INSURANCE | Admitting: Gastroenterology

## 2013-07-29 ENCOUNTER — Encounter: Payer: Self-pay | Admitting: Gastroenterology

## 2013-07-29 VITALS — BP 140/66 | HR 82 | Ht 64.0 in | Wt 139.0 lb

## 2013-07-29 DIAGNOSIS — D6489 Other specified anemias: Secondary | ICD-10-CM

## 2013-07-29 DIAGNOSIS — R195 Other fecal abnormalities: Secondary | ICD-10-CM

## 2013-07-29 MED ORDER — NA SULFATE-K SULFATE-MG SULF 17.5-3.13-1.6 GM/177ML PO SOLN: 1.0000 | Freq: Once | ORAL | Status: DC

## 2013-07-29 NOTE — Patient Instructions (Signed)
You have been scheduled for an endoscopy and colonoscopy. Please follow the written instructions given to you at your visit today.  We gave given you a free sample of the colonoscopy prep.  If you use inhalers (even only as needed), please bring them with you on the day of your procedure. Your physician has requested that you go to www.[email or web address] and enter the access code given to you at your visit today. This web site gives a general overview about your procedure. However, you should still follow specific instructions given to you by our office regarding your preparation for the procedure.

## 2013-08-05 ENCOUNTER — Encounter: Payer: Self-pay | Admitting: Gastroenterology

## 2013-08-05 DIAGNOSIS — D500 Iron deficiency anemia secondary to blood loss (chronic): Secondary | ICD-10-CM | POA: Insufficient documentation

## 2013-08-05 DIAGNOSIS — R195 Other fecal abnormalities: Secondary | ICD-10-CM | POA: Insufficient documentation

## 2013-08-05 NOTE — Progress Notes (Signed)
08/05/2013 [name] [id number] 05-13-1934   HISTORY OF PRESENT ILLNESS:  Patient is a 79 year old female who is previously known to Dr. Henrene Pastor for colonoscopy in 2007 at which time the prep was poor and she was noted to have only diverticulosis.  She tells me that she ended up very sick and requiring abdominal surgery just a couple of days following her colonoscopy at that time.  She places the blame on Dr. Henrene Pastor, but looking at her imaging and notes at that time she appeared to have an internal hernia, which caused a high grade SBO and required surgery.  She says that she "almost died" twice during that hospitalization, but I read through the discharge instructions and her post-op course was uncomplicated (had a mild post-op ileus but otherwise did well) and was discharged on post-op day 9.  Anyway, she presents to the office today due to recent findings of heme positive stools on 5 separate occasions and a decrease in her Hgb with low iron levels.  Her most recent Hgb is 10.1 grams and 10 months ago it was 12.3 grams.  Her iron studies show iron level of 44 and % sat 15.  She had been taking 65 mg iron supplement daily and that was increased to BID recently.  She denies any dark or bloody stools.  Really has no GI complaints.  Just complains of feeling constantly weak and fatigued.  She also tells me that was had a "peptic ulcer" years ago from hydrocortisone of some sort.    She also started talking about her mother having pancreatic cancer and her father having bladder cancer.  She says that she read that there is a parasite that causes those cancers and that she used to be their caregivers so she hopes that she did not catch the parasite from them (says that she read it in the "[employer]".    She is requesting that Dr. Deatra Ina perform her procedures since several of her family members follow with him as well.   Past Medical History  Diagnosis Date  . HYPOTHYROIDISM 09/21/2006  .  HYPERCHOLESTEROLEMIA 06/05/2009  . ANEMIA-NOS 09/21/2006  . Somatization disorder 04/25/2007  . HYPERTENSION 09/21/2006  . ALLERGIC RHINITIS 09/21/2006  . ASTHMA 09/21/2006  . GERD 09/21/2006  . RENAL INSUFFICIENCY 09/21/2006  . UNSPECIFIED URINARY CALCULUS 05/28/2008  . CHEST PAIN, ATYPICAL 05/28/2008  . ABDOMINAL PAIN, CHRONIC 05/28/2008  . DIVERTICULITIS, HX OF 09/21/2006  . ASYMPTOMATIC POSTMENOPAUSAL STATUS 05/28/2008  . DDD (degenerative disc disease)   . Cataract   . COPD (chronic obstructive pulmonary disease)   . Small bowel obstruction    Past Surgical History  Procedure Laterality Date  . Nasal sinus surgery  1967  . Dental surgery  1970  . Appendectomy  1959  . Cholecystectomy  1983  . Abdominal hysterectomy  1981  . Pulmonary thrombosis    . Tonsillectomy    . Asthma & pneumonia birth  82's  . Acute nephritis  1958  . Tubal ligation    . Cesarean section    . Colon surgery      reports that she has never smoked. She has never used smokeless tobacco. She reports that she does not drink alcohol or use illicit drugs. family history includes Cancer in her father and mother. Allergies  Allergen Reactions  . Nitrofurantoin     REACTION: Syncope      Outpatient Encounter Prescriptions as of 07/29/2013  Medication Sig  .  aspirin 81 MG EC tablet Take 81 mg by mouth daily.    . cyanocobalamin 500 MCG tablet Take 500 mcg by mouth daily.  . Ferrous Sulfate (IRON) 325 (65 FE) MG TABS Take 1 tablet by mouth as needed.  . fluticasone (FLOVENT HFA) 110 MCG/ACT inhaler 2 PUFF TWICE DAILY  . levothyroxine (SYNTHROID, LEVOTHROID) 50 MCG tablet Take 1 tablet (50 mcg total) by mouth daily before breakfast.  . magnesium 30 MG tablet Take 30 mg by mouth 2 (two) times daily.  . Multiple Vitamin (MULTIVITAMIN) tablet Take 1 tablet by mouth daily.    . Na Sulfate-K Sulfate-Mg Sulf SOLN Take 1 kit by mouth once.  . [DISCONTINUED] clobetasol ointment (TEMOVATE) 0.05 % Apply topically 2 (two)  times daily.  . [DISCONTINUED] lidocaine-prilocaine (EMLA) cream Apply topically as needed.     REVIEW OF SYSTEMS  : All other systems reviewed and negative except where noted in the History of Present Illness.   PHYSICAL EXAM: BP 140/66  Pulse 82  Ht 5' 4"  (1.626 m)  Wt 139 lb (63.05 kg)  BMI 23.85 kg/m2 General: Well developed white female in no acute distress Head: Normocephalic and atraumatic Eyes:  Sclerae anicteric, conjunctiva pink. Ears: Normal auditory acuity  Lungs:  Clear throughout to auscultation Heart: Regular rate and rhythm Abdomen: Soft, non-distended.  Normal bowel sounds.  Non-tender. Rectal:  Deferred.  Will be done at the time time of colonoscopy. Musculoskeletal: Symmetrical with no gross deformities  Skin: No lesions on visible extremities Extremities: No edema  Neurological: Alert oriented x 4, grossly non-focal Psychological:  Alert and cooperative. Normal mood and affect  ASSESSMENT AND PLAN: -Heme positive stools and anemia (normal MCV but iron deficiency on labs) with a 2 gram drop in her Hgb within the past 10 months.  Patient denies any dark or bloody stools.  Last colonoscopy was 2007 with poor prep by Dr. Henrene Pastor.  I have recommended repeat colonoscopy since the last was 8 years ago as well as an EGD.  Patient requesting that Dr. Deatra Ina perform her procedures instead of Dr. Henrene Pastor.  The risks, benefits, and alternatives were discussed with the patient and she consents to proceed.

## 2013-08-12 NOTE — Progress Notes (Signed)
Reviewed and agree with management. Kerin Kren D. Avi Kerschner, M.D., FACG  

## 2013-08-15 ENCOUNTER — Encounter: Payer: Self-pay | Admitting: Gastroenterology

## 2013-08-15 ENCOUNTER — Ambulatory Visit (AMBULATORY_SURGERY_CENTER): Payer: Medicaid Other | Admitting: Gastroenterology

## 2013-08-15 VITALS — BP 116/51 | HR 73 | Temp 97.9°F | Resp 20 | Ht 64.0 in | Wt 139.0 lb

## 2013-08-15 DIAGNOSIS — D128 Benign neoplasm of rectum: Secondary | ICD-10-CM

## 2013-08-15 DIAGNOSIS — K2990 Gastroduodenitis, unspecified, without bleeding: Secondary | ICD-10-CM

## 2013-08-15 DIAGNOSIS — K2970 Gastritis, unspecified, without bleeding: Secondary | ICD-10-CM

## 2013-08-15 DIAGNOSIS — D649 Anemia, unspecified: Secondary | ICD-10-CM | POA: Diagnosis not present

## 2013-08-15 DIAGNOSIS — R195 Other fecal abnormalities: Secondary | ICD-10-CM | POA: Diagnosis not present

## 2013-08-15 DIAGNOSIS — D129 Benign neoplasm of anus and anal canal: Secondary | ICD-10-CM

## 2013-08-15 DIAGNOSIS — D126 Benign neoplasm of colon, unspecified: Secondary | ICD-10-CM

## 2013-08-15 MED ORDER — FAMOTIDINE 20 MG PO TABS
20.0000 mg | ORAL_TABLET | Freq: Two times a day (BID) | ORAL | Status: DC
Start: 2013-08-15 — End: 2013-10-07

## 2013-08-15 MED ORDER — SODIUM CHLORIDE 0.9 % IV SOLN: 500.0000 mL | INTRAVENOUS | Status: DC

## 2013-08-15 NOTE — Progress Notes (Signed)
Pt. Complains that she has had chest congestion over last days.  She states she is coughing up clear sputum.  Denies fever.  No shortness of breath observed.  Communicated This to [employer].  Pt has inhaler at bedside.

## 2013-08-15 NOTE — Progress Notes (Signed)
Called to room to assist during endoscopic procedure.  Patient ID and intended procedure confirmed with present staff. Received instructions for my participation in the procedure from the performing physician.  

## 2013-08-15 NOTE — Progress Notes (Signed)
A/ox3, pleased with MAC, report to RN 

## 2013-08-15 NOTE — Patient Instructions (Addendum)
YOU HAD AN ENDOSCOPIC PROCEDURE TODAY AT [facility]: Refer to the procedure report that was given to you for any specific questions about what was found during the examination.  If the procedure report does not answer your questions, please call your gastroenterologist to clarify.  If you requested that your care partner not be given the details of your procedure findings, then the procedure report has been included in a sealed envelope for you to review at your convenience later.  YOU SHOULD EXPECT: Some feelings of bloating in the abdomen. Passage of more gas than usual.  Walking can help get rid of the air that was put into your GI tract during the procedure and reduce the bloating. If you had a lower endoscopy (such as a colonoscopy or flexible sigmoidoscopy) you may notice spotting of blood in your stool or on the toilet paper. If you underwent a bowel prep for your procedure, then you may not have a normal bowel movement for a few days.  DIET: Your first meal following the procedure should be a light meal and then it is ok to progress to your normal diet.  A half-sandwich or bowl of soup is an example of a good first meal.  Heavy or fried foods are harder to digest and may make you feel nauseous or bloated.  Likewise meals heavy in dairy and vegetables can cause extra gas to form and this can also increase the bloating.  Drink plenty of fluids but you should avoid alcoholic beverages for 24 hours.  ACTIVITY: Your care partner should take you home directly after the procedure.  You should plan to take it easy, moving slowly for the rest of the day.  You can resume normal activity the day after the procedure however you should NOT DRIVE or use heavy machinery for 24 hours (because of the sedation medicines used during the test).    SYMPTOMS TO REPORT IMMEDIATELY: A gastroenterologist can be reached at any hour.  During normal business hours, 8:30 AM to 5:00 PM Monday through Friday,  call [phone].  After hours and on weekends, please call the GI answering service at [phone] who will take a message and have the physician on call contact you.   Following lower endoscopy (colonoscopy or flexible sigmoidoscopy):  Excessive amounts of blood in the stool  Significant tenderness or worsening of abdominal pains  Swelling of the abdomen that is new, acute  Fever of 100F or higher  Following upper endoscopy (EGD)  Vomiting of blood or coffee ground material  New chest pain or pain under the shoulder blades  Painful or persistently difficult swallowing  New shortness of breath  Fever of 100F or higher  Black, tarry-looking stools  FOLLOW UP: If any biopsies were taken you will be contacted by phone or by letter within the next 1-3 weeks.  Call your gastroenterologist if you have not heard about the biopsies in 3 weeks.  Our staff will call the home number listed on your records the next business day following your procedure to check on you and address any questions or concerns that you may have at that time regarding the information given to you following your procedure. This is a courtesy call and so if there is no answer at the home number and we have not heard from you through the emergency physician on call, we will assume that you have returned to your regular daily activities without incident.  SIGNATURES/CONFIDENTIALITY: You and/or your care  partner have signed paperwork which will be entered into your electronic medical record.  These signatures attest to the fact that that the information above on your After Visit Summary has been reviewed and is understood.  Full responsibility of the confidentiality of this discharge information lies with you and/or your care-partner.  Please read over handouts about gastritis, diverticulosis, high fiber diets, and polyps  Continue your normal medications  Your prescription was sent to your [facility] pharmacy on [address]

## 2013-08-15 NOTE — Op Note (Signed)
[name], [id number]   COLONOSCOPY PROCEDURE REPORT  PATIENT: [name], [name]  MR#: [id number] BIRTHDATE: [birth year] , 43  yrs. old GENDER: Female ENDOSCOPIST: Inda Castle, MD REFERRED XB:JYNW [place] Stacks, M.D. PROCEDURE DATE:  08/15/2013 PROCEDURE:   Colonoscopy with biopsy First Screening Colonoscopy - Avg.  risk and is 50 yrs.  old or older - No.  Prior Negative Screening - Now for repeat screening. Other: See Comments  History of Adenoma - Now for follow-up colonoscopy & has been > or = to 3 yrs.  N/A  Polyps Removed Today? No.  Recommend repeat exam, <10 yrs? No. ASA CLASS:   Class II INDICATIONS:occult blood . MEDICATIONS: MAC sedation, administered by CRNA and propofol (Diprivan) 39m IV  DESCRIPTION OF PROCEDURE:   After the risks benefits and alternatives of the procedure were thoroughly explained, informed consent was obtained.  A digital rectal exam revealed no abnormalities of the rectum.   The LB CF-[id number] [id number] was introduced through the anus and advanced to the cecum, which was identified by both the appendix and ileocecal valve. No adverse events experienced.   The quality of the prep was Suprep good  The instrument was then slowly withdrawn as the colon was fully examined.      COLON FINDINGS: In the rectal vault there was a 3-4 cm area of raised mucosa was friable incorporating at least 25% of the rectal vault.  Biopsies were taken to rule out adenomatous changes.  This is a possible source for Hemoccult-positive stool.   There was severe diverticulosis noted in the sigmoid colon and descending colon with associated luminal narrowing and colonic spasm.   The colon was otherwise normal.  There was no diverticulosis, inflammation, polyps or cancers unless previously stated. Retroflexed views revealed no abnormalities. The time to cecum=11 minutes 11 seconds.  Withdrawal time=8 minutes 58  seconds.  The scope was withdrawn and the procedure completed. COMPLICATIONS: There were no complications.  ENDOSCOPIC IMPRESSION: 1.   abnormal mucosa in the rectal vault-rule out adenomatous changes versus proctitis - a source for Hemoccult-positive stool 2.  diverticulosis  RECOMMENDATIONS: Await biopsy results   eSigned:  RInda Castle MD 08/15/2013 10:50 AM   cc:   PATIENT NAME:  [name], EstockMR#: [id number]

## 2013-08-15 NOTE — Op Note (Signed)
[place]  [address]. [place], [address]   ENDOSCOPY PROCEDURE REPORT  PATIENT: [name], [name]  MR#: [id number] BIRTHDATE: 11-16-1934 , 9  yrs. old GENDER: Female ENDOSCOPIST: Inda Castle, MD REFERRED BY:  Donavan Foil, M.D. PROCEDURE DATE:  08/15/2013 PROCEDURE:  EGD w/ biopsy ASA CLASS:     Class II INDICATIONS:  Iron deficiency anemia.   Occult blood positive. MEDICATIONS: There was residual sedation effect present from prior procedure, MAC sedation, administered by CRNA, and propofol (Diprivan) 22m IV TOPICAL ANESTHETIC:  DESCRIPTION OF PROCEDURE: After the risks benefits and alternatives of the procedure were thoroughly explained, informed consent was obtained.  The LB [id number] was introduced through the mouth and advanced to the third portion of the duodenum. Without limitations.  The instrument was slowly withdrawn as the mucosa was fully examined.      An esophageal stricture was present at the GE junction.  The 9 mm gastroscope easily traversed the stricture.  There was a 5 cm sliding hiatal hernia. In the stomach there is diffuse erythema with a few erosions in the gastric body and antrum.  Biopsies were taken.   The remainder of the upper endoscopy exam was otherwise normal.  Retroflexed views revealed no abnormalities.     The scope was then withdrawn from the patient and the procedure completed.  COMPLICATIONS: There were no complications. ENDOSCOPIC IMPRESSION: 1.   esophageal stricture 2.  gastritis  RECOMMENDATIONS: Await pathology results Pepcid  252mqd  REPEAT EXAM:  eSigned:  RoInda CastleMD 08/15/2013 10:54 AM   CC:

## 2013-08-16 ENCOUNTER — Telehealth: Payer: Self-pay | Admitting: *Deleted

## 2013-08-16 NOTE — Telephone Encounter (Signed)
  Follow up Call-  Call back number 08/15/2013  Post procedure Call Back phone  # [phone]  Permission to leave phone message Yes     Patient questions:  Do you have a fever, pain , or abdominal swelling? No. Pain Score  0 *  Have you tolerated food without any problems? Yes.    Have you been able to return to your normal activities? Yes.    Do you have any questions about your discharge instructions: Diet   No. Medications  No. Follow up visit  No.  Do you have questions or concerns about your Care? No.  Actions: * If pain score is 4 or above: No action needed, pain <4.

## 2013-08-21 ENCOUNTER — Encounter: Payer: Self-pay | Admitting: Gastroenterology

## 2013-09-02 ENCOUNTER — Encounter: Payer: PRIVATE HEALTH INSURANCE | Admitting: Gastroenterology

## 2013-09-23 ENCOUNTER — Telehealth: Payer: Self-pay | Admitting: Gastroenterology

## 2013-09-23 ENCOUNTER — Ambulatory Visit (INDEPENDENT_AMBULATORY_CARE_PROVIDER_SITE_OTHER): Payer: PRIVATE HEALTH INSURANCE | Admitting: Endocrinology

## 2013-09-23 ENCOUNTER — Encounter: Payer: Self-pay | Admitting: Endocrinology

## 2013-09-23 VITALS — BP 128/80 | HR 83 | Temp 98.2°F | Ht 64.0 in | Wt 137.0 lb

## 2013-09-23 DIAGNOSIS — D6489 Other specified anemias: Secondary | ICD-10-CM

## 2013-09-23 DIAGNOSIS — E039 Hypothyroidism, unspecified: Secondary | ICD-10-CM

## 2013-09-23 LAB — CBC WITH DIFFERENTIAL/PLATELET
Basophils Absolute: 0.1 10*3/uL (ref 0.0–0.1)
Basophils Relative: 0.5 % (ref 0.0–3.0)
EOS PCT: 5.3 % — AB (ref 0.0–5.0)
Eosinophils Absolute: 0.6 10*3/uL (ref 0.0–0.7)
HCT: 36.6 % (ref 36.0–46.0)
HEMOGLOBIN: 12.5 g/dL (ref 12.0–15.0)
Lymphocytes Relative: 19.4 % (ref 12.0–46.0)
Lymphs Abs: 2.1 10*3/uL (ref 0.7–4.0)
MCHC: 34.2 g/dL (ref 30.0–36.0)
MCV: 91.7 fl (ref 78.0–100.0)
Monocytes Absolute: 1.2 10*3/uL — ABNORMAL HIGH (ref 0.1–1.0)
Monocytes Relative: 11.4 % (ref 3.0–12.0)
NEUTROS PCT: 63.4 % (ref 43.0–77.0)
Neutro Abs: 6.8 10*3/uL (ref 1.4–7.7)
Platelets: 304 10*3/uL (ref 150.0–400.0)
RBC: 3.99 Mil/uL (ref 3.87–5.11)
RDW: 14.3 % (ref 11.5–15.5)
WBC: 10.8 10*3/uL — AB (ref 4.0–10.5)

## 2013-09-23 LAB — IBC PANEL
Iron: 21 ug/dL — ABNORMAL LOW (ref 42–145)
Saturation Ratios: 6.9 % — ABNORMAL LOW (ref 20.0–50.0)
Transferrin: 218 mg/dL (ref 212.0–360.0)

## 2013-09-23 MED ORDER — METRONIDAZOLE 250 MG PO TABS: 250.0000 mg | ORAL_TABLET | Freq: Three times a day (TID) | ORAL | Status: DC

## 2013-09-23 NOTE — Progress Notes (Signed)
Subjective:    Patient ID: [name], female    DOB: 21-Oct-1934, 79 y.o.   MRN: [id number]  HPI Pt states 1 day of slight pain across the lower abdomen, and assoc passing mucous from the rectum.   She take fe only 1/day, as she could not tolerate 2/day (constipation). Past Medical History  Diagnosis Date  . HYPOTHYROIDISM 09/21/2006  . HYPERCHOLESTEROLEMIA 06/05/2009  . ANEMIA-NOS 09/21/2006  . Somatization disorder 04/25/2007  . HYPERTENSION 09/21/2006  . ALLERGIC RHINITIS 09/21/2006  . ASTHMA 09/21/2006  . GERD 09/21/2006  . RENAL INSUFFICIENCY 09/21/2006  . UNSPECIFIED URINARY CALCULUS 05/28/2008  . CHEST PAIN, ATYPICAL 05/28/2008  . ABDOMINAL PAIN, CHRONIC 05/28/2008  . DIVERTICULITIS, HX OF 09/21/2006  . ASYMPTOMATIC POSTMENOPAUSAL STATUS 05/28/2008  . DDD (degenerative disc disease)   . Cataract   . COPD (chronic obstructive pulmonary disease)   . Small bowel obstruction     Past Surgical History  Procedure Laterality Date  . Nasal sinus surgery  1967  . Dental surgery  1970  . Appendectomy  1959  . Cholecystectomy  1983  . Abdominal hysterectomy  1981  . Pulmonary thrombosis    . Tonsillectomy    . Asthma & pneumonia birth  62's  . Acute nephritis  1958  . Tubal ligation    . Cesarean section    . Colon surgery      History   Social History  . Marital Status: Divorced    Spouse Name: N/A    Number of Children: N/A  . Years of Education: N/A   Occupational History  . Not on file.   Social History Main Topics  . Smoking status: Never Smoker   . Smokeless tobacco: Never Used  . Alcohol Use: No  . Drug Use: No  . Sexual Activity: No   Other Topics Concern  . Not on file   Social History Narrative  . No narrative on file    Current Outpatient Prescriptions on File Prior to Visit  Medication Sig Dispense Refill  . aspirin 81 MG EC tablet Take 81 mg by mouth daily.        . cyanocobalamin 500 MCG tablet Take 500 mcg by mouth daily.      . famotidine  (PEPCID) 20 MG tablet Take 1 tablet (20 mg total) by mouth 2 (two) times daily.  30 tablet  1  . Ferrous Sulfate (IRON) 325 (65 FE) MG TABS Take 1 tablet by mouth as needed.      . fluticasone (FLOVENT HFA) 110 MCG/ACT inhaler 2 PUFF TWICE DAILY  12 g  11  . levothyroxine (SYNTHROID, LEVOTHROID) 50 MCG tablet Take 1 tablet (50 mcg total) by mouth daily before breakfast.  90 tablet  3  . magnesium 30 MG tablet Take 30 mg by mouth 2 (two) times daily.      . Multiple Vitamin (MULTIVITAMIN) tablet Take 1 tablet by mouth daily.         No current facility-administered medications on file prior to visit.    Allergies  Allergen Reactions  . Nitrofurantoin     REACTION: Syncope    Family History  Problem Relation Age of Onset  . Cancer Mother   . Cancer Father     BP 128/80  Pulse 83  Temp(Src) 98.2 F (36.8 C) (Oral)  Ht 5' 4"  (1.626 m)  Wt 137 lb (62.143 kg)  BMI 23.50 kg/m2  SpO2 90%  Review of Systems Denies n/v and BRBPR  Objective:   Physical Exam VITAL SIGNS:  See vs page GENERAL: no distress ABDOMEN: abdomen is soft, nontender.  no hepatosplenomegaly.  not distended.  no hernia.    Lab Results  Component Value Date   TSH 3.61 09/23/2013   Lab Results  Component Value Date   WBC 10.8* 09/23/2013   HGB 12.5 09/23/2013   HCT 36.6 09/23/2013   MCV 91.7 09/23/2013   PLT 304.0 09/23/2013       Assessment & Plan:  abd pain, new, uncertain etiology Hypothyroidism: well-replaced.  Same levothyroxine.   Anemia: improved, but iron is still low.     Patient is advised the following: Patient Instructions  blood tests are being requested for you today.  We'll contact you with results. i have sent a prescription to your pharmacy, for an antibiotic pill.   I hope you feel better soon.  If you don't feel better by next week, please call back.  Please call sooner if you get worse.  Iron is low. Please continue iron pills. Your white-blood cells are slightly high, so  please see dr Deatra Ina as scheduled.

## 2013-09-23 NOTE — Telephone Encounter (Signed)
Spoke with the patient who c/o rectal pain and passage of first a mucous/pus and then bloody stool. She states she has a history of diverticulitis. Afebrile, but does not feel good. No abdominal pain. Feels"weak". Denies any syncope or near syncope. Appointment scheduled for Friday. Patient agrees to contact her PCP today and ask for an appointment today. Declines to go to the ER

## 2013-09-23 NOTE — Telephone Encounter (Signed)
Okay to be seen later this week

## 2013-09-23 NOTE — Patient Instructions (Addendum)
blood tests are being requested for you today.  We'll contact you with results. i have sent a prescription to your pharmacy, for an antibiotic pill.   I hope you feel better soon.  If you don't feel better by next week, please call back.  Please call sooner if you get worse.

## 2013-09-24 ENCOUNTER — Telehealth: Payer: Self-pay | Admitting: Gastroenterology

## 2013-09-24 LAB — TSH: TSH: 3.61 u[IU]/mL (ref 0.35–4.50)

## 2013-09-24 NOTE — Telephone Encounter (Signed)
This lady is being treated for presumed diverticulitis by Dr Loanne Drilling. He saw her  yesterday and started her on ATB's. Her WBC is 10.8.She had cancelled the appointment she had here but has now been instructed to see you. She is also anemic, which what she is focused on right now. My question is when do you want her seen. There are no appointments this week at all. She states she feels weak and tired and has since her colonoscopy.

## 2013-09-24 NOTE — Telephone Encounter (Signed)
Spoke with the patient. She cancelled her appointment here.

## 2013-09-24 NOTE — Telephone Encounter (Signed)
Patient was seen by PCP yesterday and started on ATB. Labs were also drawn.

## 2013-09-25 NOTE — Telephone Encounter (Signed)
Patient advised. She will continue her iron tablets and Flagyl as directed by Dr Loanne Drilling. Appointment 10/07/13.

## 2013-09-25 NOTE — Telephone Encounter (Signed)
Her hemoglobin actually has improved by 2 g.  We'll have to fit her in sometime in the next 2-3 weeks.  There is no urgency

## 2013-09-27 ENCOUNTER — Ambulatory Visit: Payer: PRIVATE HEALTH INSURANCE | Admitting: Nurse Practitioner

## 2013-10-05 ENCOUNTER — Other Ambulatory Visit: Payer: Self-pay | Admitting: Endocrinology

## 2013-10-07 ENCOUNTER — Encounter: Payer: Self-pay | Admitting: Physician Assistant

## 2013-10-07 ENCOUNTER — Ambulatory Visit (INDEPENDENT_AMBULATORY_CARE_PROVIDER_SITE_OTHER): Payer: PRIVATE HEALTH INSURANCE | Admitting: Physician Assistant

## 2013-10-07 VITALS — BP 130/76 | HR 92 | Ht 64.0 in | Wt 139.2 lb

## 2013-10-07 DIAGNOSIS — R1032 Left lower quadrant pain: Secondary | ICD-10-CM

## 2013-10-07 DIAGNOSIS — K219 Gastro-esophageal reflux disease without esophagitis: Secondary | ICD-10-CM

## 2013-10-07 MED ORDER — RANITIDINE HCL 150 MG PO TABS: 150.0000 mg | ORAL_TABLET | Freq: Two times a day (BID) | ORAL | Status: DC

## 2013-10-07 NOTE — Progress Notes (Signed)
Subjective:    Patient ID: [name], female    DOB: 07/05/1934, 79 y.o.   MRN: [id number]  HPI  [name] is a 79 year old white female former patient of Dr. Henrene Pastor is recently known to Dr. Deatra Ina. She has history of asthma, cemented sedation disorder, hypothyroidism, GERD, iron deficiency, and has recently undergone workup for Hemoccult-positive stool. She was found to be heme positive on routine physical exam. She does have history of an iron deficiency and most recent labs showed serum iron of 21 transferrin 218 aren't 76.9 however her hemoglobin is normal at 12.5 and hematocrit of 36.6. She had EGD on 08/15/2013 was noted to have a mild distal esophageal stricture which was not dilated a 5 cm hiatal hernia and some erosive gastritis. She was given a prescription for Pepcid which she did not take because she says the package insert listed caution in patients with pulmonary disease. She also had colonoscopy at that same time she had severe diverticulosis with some evidence of spasm and luminal narrowing and 1 area of raised mucosa in the rectum question adenomatous status change. Biopsies were taken and these showed benign lymphoid tissue. Is patient states that she's been having worse problems with acid reflux since her procedures which she was not having previously. She denies any regular dysphagia though she has occasionally had an episode of solid food dysphagia, no diet aphasia. She also had an episode of acute onset of lower abdominal pain cramping followed by some mucoid loose stool and bright red blood which occurred a couple of weeks ago. She was seen by Dr. Loanne Drilling and given a course of Flagyl for possible diverticulitis. She says she took Flagyl even though she is intolerant to it ,and the rectal bleeding which was small volume stopped after 2 days her stools are now back to normal and she's not having any ongoing lower abdominal pain. She says her bowel movements are not as regular as they had  been but again no diarrhea. Her main complaint today seems to be heartburn and reflux symptoms. She is very hesitant to take any medication, says she can't take antacids because she worries about absorbing her iron, can take Protonix because she had an allergic reaction to that and is not sure about the H2 blockers.    Review of Systems  Constitutional: Positive for fatigue.  HENT: Negative.   Eyes: Negative.   Respiratory: Negative.   Cardiovascular: Negative.   Gastrointestinal: Positive for abdominal pain and anal bleeding.  Musculoskeletal: Negative.   Skin: Negative.   Neurological: Negative.   Hematological: Negative.   Psychiatric/Behavioral: Negative.    Outpatient Prescriptions Prior to Visit  Medication Sig Dispense Refill  . cyanocobalamin 500 MCG tablet Take 500 mcg by mouth daily.      . Ferrous Sulfate (IRON) 325 (65 FE) MG TABS Take 1 tablet by mouth as needed.      Marland Kitchen FLOVENT HFA 110 MCG/ACT inhaler INHALE 2 PUFF TWICE DAILY  12 g  4  . levothyroxine (SYNTHROID, LEVOTHROID) 50 MCG tablet Take 1 tablet (50 mcg total) by mouth daily before breakfast.  90 tablet  3  . magnesium 30 MG tablet Take 30 mg by mouth 2 (two) times daily.      . Multiple Vitamin (MULTIVITAMIN) tablet Take 1 tablet by mouth daily.        Marland Kitchen aspirin 81 MG EC tablet Take 81 mg by mouth daily.        . famotidine (PEPCID) 20 MG  tablet Take 1 tablet (20 mg total) by mouth 2 (two) times daily.  30 tablet  1  . metroNIDAZOLE (FLAGYL) 250 MG tablet Take 1 tablet (250 mg total) by mouth 3 (three) times daily.  15 tablet  0   No facility-administered medications prior to visit.   Allergies  Allergen Reactions  . Metronidazole Shortness Of Breath and Nausea And Vomiting  . Macrobid [Nitrofurantoin]     REACTION: Syncope  . Pepcid [Famotidine]    Patient Active Problem List   Diagnosis Date Noted  . Hematest positive stools 08/05/2013  . Anemia due to other cause 08/05/2013  . Cerumen impaction  06/03/2013  . History of adrenal disorder 10/15/2012  . Rash and nonspecific skin eruption 10/15/2012  . Iron deficiency anemia, unspecified 06/20/2012  . Psychogenic polydipsia 06/20/2012  . Nonspecific abnormal electrocardiogram (ECG) (EKG) 06/20/2012  . Polyuria 05/02/2012  . Pelvic pain in female 08/16/2011  . Leg ulcer 06/16/2010  . Routine general medical examination at a health care facility 06/11/2010  . Encounter for long-term (current) use of other medications 06/11/2010  . HYPERCHOLESTEROLEMIA 06/05/2009  . UNSPECIFIED URINARY CALCULUS 05/28/2008  . CHEST PAIN, ATYPICAL 05/28/2008  . ABDOMINAL PAIN, CHRONIC 05/28/2008  . ASYMPTOMATIC POSTMENOPAUSAL STATUS 05/28/2008  . SOMATIZATION DISORDER 04/25/2007  . HYPOTHYROIDISM 09/21/2006  . HYPERTENSION 09/21/2006  . ALLERGIC RHINITIS 09/21/2006  . ASTHMA 09/21/2006  . GERD 09/21/2006  . RENAL INSUFFICIENCY 09/21/2006  . DIVERTICULITIS, HX OF 09/21/2006   History  Substance Use Topics  . Smoking status: Never Smoker   . Smokeless tobacco: Never Used  . Alcohol Use: No     family history includes Cancer in her father and mother.  Objective:   Physical Exam well-developed older white female in no acute distress, blood pressure 130/76 pulse 92 height 5 foot 4 weight 139. HEENT; nontraumatic normocephalic EOMI PERRLA sclera anicteric, Neck; supple no JVD, Cardiovascular; regular rate and rhythm with S1-S2 no murmur or gallop, Pulmonary; clear bilaterally, Abdomen; soft she has  minimal epigastric tenderness, there is no guarding or rebound no polyp palpable mass or hepatosplenomegaly bowel sounds are present, Midline incisional scar, Rectal ;exam not done, Extremities ;no clubbing cyanosis or edema skin warm and dry, Psych; mood and affect appropriate        Assessment & Plan:  #55 79 year old female with exacerbation of GERD, currently on no acid blocker treatment  Patient is intolerant to PPIs  #2 large hiatal hernia and  moderate distal stricture  #3 erosive gastritis  #4 diverticulosis severe with luminal narrowing  #5 recent episode of lower abdominal pain mucoid stool and small volume hematochezia not clear whether this represented mild episode of segmental ischemic colitis, or diverticulitis. She is improved after an empiric course of metronidazole  #6 iron deficiency  #7 asthma  #8 hypothyroidism  #9 history of somatization  Disorder  Plan; patient will try ranitidine 150 mg by mouth twice daily, prescription given Antireflux diet Discussed possible esophageal dilation which she is not interested in at this time Observe for recurrence of lower abdominal pain in patient is advised to call should she have recurrence of pain and/or rectal bleeding and at that time would pursue imaging with CT of the abdomen and pelvis Continue oral iron but increase to twice daily Colonoscopy result reviewed with Dr. Deatra Ina regarding the possible rectal polypoid tissue, again the biopsy showed only benign lymphoid tissue and he does not feel that she needs any further biopsies or evaluation of this.

## 2013-10-07 NOTE — Patient Instructions (Signed)
We sent a prescription for Zantac ( Ranitidine ) to [facility]. Take the iron supplement twice daily with food.  Call us back ir you have reoccurance of abdominal pain.  We have given you a brochure for Reflux.

## 2013-10-08 NOTE — Progress Notes (Signed)
Reviewed and agree with management. Kairav Russomanno D. Rivan Siordia, M.D., FACG  

## 2013-10-29 ENCOUNTER — Telehealth: Payer: Self-pay | Admitting: Endocrinology

## 2013-10-29 NOTE — Telephone Encounter (Signed)
See Below,  Thanks!

## 2013-10-29 NOTE — Telephone Encounter (Signed)
Pt advised and she states that she will think about it and decide. She states she doesn't feel like it would help. Pt strongly advised this is what Dr. Loanne Drilling things is most appropriate. PT states she will make the decision if she thinks it will help.

## 2013-10-29 NOTE — Telephone Encounter (Signed)
please call patient: Go to ER now

## 2013-10-29 NOTE — Telephone Encounter (Signed)
[name] is a Social research officer, government from [employer] and did a home visit with the patient  She states [name] CC is mild epigastric tenderness, black tarry stools, and confirmed GI bleed [name] did discontinue taking her ASA  Please call [name] back as she would like to consult [name] issues  Thank You   [phone]

## 2013-10-30 ENCOUNTER — Ambulatory Visit (INDEPENDENT_AMBULATORY_CARE_PROVIDER_SITE_OTHER): Payer: Medicare Other | Admitting: Family Medicine

## 2013-10-30 VITALS — BP 130/80 | HR 85 | Temp 98.3°F | Resp 16 | Ht 64.0 in | Wt 137.0 lb

## 2013-10-30 DIAGNOSIS — J309 Allergic rhinitis, unspecified: Secondary | ICD-10-CM

## 2013-10-30 DIAGNOSIS — H6980 Other specified disorders of Eustachian tube, unspecified ear: Secondary | ICD-10-CM

## 2013-10-30 DIAGNOSIS — R5383 Other fatigue: Secondary | ICD-10-CM

## 2013-10-30 DIAGNOSIS — R5381 Other malaise: Secondary | ICD-10-CM

## 2013-10-30 DIAGNOSIS — H6993 Unspecified Eustachian tube disorder, bilateral: Secondary | ICD-10-CM

## 2013-10-30 DIAGNOSIS — H6983 Other specified disorders of Eustachian tube, bilateral: Secondary | ICD-10-CM

## 2013-10-30 LAB — POCT CBC
Granulocyte percent: 53.1 %G (ref 37–80)
HEMATOCRIT: 39.3 % (ref 37.7–47.9)
HEMOGLOBIN: 12.9 g/dL (ref 12.2–16.2)
LYMPH, POC: 2.7 (ref 0.6–3.4)
MCH: 30.8 pg (ref 27–31.2)
MCHC: 32.8 g/dL (ref 31.8–35.4)
MCV: 94 fL (ref 80–97)
MID (cbc): 1.1 — AB (ref 0–0.9)
MPV: 7.9 fL (ref 0–99.8)
POC Granulocyte: 4.2 (ref 2–6.9)
POC LYMPH PERCENT: 33.7 %L (ref 10–50)
POC MID %: 13.2 %M — AB (ref 0–12)
Platelet Count, POC: 303 10*3/uL (ref 142–424)
RBC: 4.18 M/uL (ref 4.04–5.48)
RDW, POC: 14.7 %
WBC: 8 10*3/uL (ref 4.6–10.2)

## 2013-10-30 MED ORDER — NEOMYCIN-POLYMYXIN-HC 3.5-10000-1 OT SOLN: 3.0000 [drp] | Freq: Four times a day (QID) | OTIC | Status: AC

## 2013-10-30 MED ORDER — FLUTICASONE PROPIONATE 50 MCG/ACT NA SUSP: 2.0000 | Freq: Every day | NASAL | Status: DC

## 2013-10-30 NOTE — Patient Instructions (Addendum)
Your blood counts are normal.  Please follow-up with your primary physician regarding your chronic health problems.  Fatigue Fatigue is a feeling of tiredness, lack of energy, lack of motivation, or feeling tired all the time. Having enough rest, good nutrition, and reducing stress will normally reduce fatigue. Consult your caregiver if it persists. The nature of your fatigue will help your caregiver to find out its cause. The treatment is based on the cause.  CAUSES  There are many causes for fatigue. Most of the time, fatigue can be traced to one or more of your habits or routines. Most causes fit into one or more of three general areas. They are: Lifestyle problems  Sleep disturbances.  Overwork.  Physical exertion.  Unhealthy habits.  Poor eating habits or eating disorders.  Alcohol and/or drug use .  Lack of proper nutrition (malnutrition). Psychological problems  Stress and/or anxiety problems.  Depression.  Grief.  Boredom. Medical Problems or Conditions  Anemia.  Pregnancy.  Thyroid gland problems.  Recovery from major surgery.  Continuous pain.  Emphysema or asthma that is not well controlled  Allergic conditions.  Diabetes.  Infections (such as mononucleosis).  Obesity.  Sleep disorders, such as sleep apnea.  Heart failure or other heart-related problems.  Cancer.  Kidney disease.  Liver disease.  Effects of certain medicines such as antihistamines, cough and cold remedies, prescription pain medicines, heart and blood pressure medicines, drugs used for treatment of cancer, and some antidepressants. SYMPTOMS  The symptoms of fatigue include:   Lack of energy.  Lack of drive (motivation).  Drowsiness.  Feeling of indifference to the surroundings. DIAGNOSIS  The details of how you feel help guide your caregiver in finding out what is causing the fatigue. You will be asked about your present and past health condition. It is important to  review all medicines that you take, including prescription and non-prescription items. A thorough exam will be done. You will be questioned about your feelings, habits, and normal lifestyle. Your caregiver may suggest blood tests, urine tests, or other tests to look for common medical causes of fatigue.  TREATMENT  Fatigue is treated by correcting the underlying cause. For example, if you have continuous pain or depression, treating these causes will improve how you feel. Similarly, adjusting the dose of certain medicines will help in reducing fatigue.  HOME CARE INSTRUCTIONS   Try to get the required amount of good sleep every night.  Eat a healthy and nutritious diet, and drink enough water throughout the day.  Practice ways of relaxing (including yoga or meditation).  Exercise regularly.  Make plans to change situations that cause stress. Act on those plans so that stresses decrease over time. Keep your work and personal routine reasonable.  Avoid street drugs and minimize use of alcohol.  Start taking a daily multivitamin after consulting your caregiver. SEEK MEDICAL CARE IF:   You have persistent tiredness, which cannot be accounted for.  You have fever.  You have unintentional weight loss.  You have headaches.  You have disturbed sleep throughout the night.  You are feeling sad.  You have constipation.  You have dry skin.  You have gained weight.  You are taking any new or different medicines that you suspect are causing fatigue.  You are unable to sleep at night.  You develop any unusual swelling of your legs or other parts of your body. SEEK IMMEDIATE MEDICAL CARE IF:   You are feeling confused.  Your vision is blurred.  You feel faint or pass out.  You develop severe headache.  You develop severe abdominal, pelvic, or back pain.  You develop chest pain, shortness of breath, or an irregular or fast heartbeat.  You are unable to pass a normal amount of  urine.  You develop abnormal bleeding such as bleeding from the rectum or you vomit blood.  You have thoughts about harming yourself or committing suicide.  You are worried that you might harm someone else. MAKE SURE YOU:   Understand these instructions.  Will watch your condition.  Will get help right away if you are not doing well or get worse. Document Released: 11/21/2006 Document Revised: 04/18/2011 Document Reviewed: 05/28/2013 [facility] Patient Information 2015 [place], [place]. This information is not intended to replace advice given to you by your health care provider. Make sure you discuss any questions you have with your health care provider. Allergic Rhinitis Allergic rhinitis is when the mucous membranes in the nose respond to allergens. Allergens are particles in the air that cause your body to have an allergic reaction. This causes you to release allergic antibodies. Through a chain of events, these eventually cause you to release histamine into the blood stream. Although meant to protect the body, it is this release of histamine that causes your discomfort, such as frequent sneezing, congestion, and an itchy, runny nose.  CAUSES  Seasonal allergic rhinitis (hay fever) is caused by pollen allergens that may come from grasses, trees, and weeds. Year-round allergic rhinitis (perennial allergic rhinitis) is caused by allergens such as house dust mites, pet dander, and mold spores.  SYMPTOMS   Nasal stuffiness (congestion).  Itchy, runny nose with sneezing and tearing of the eyes. DIAGNOSIS  Your health care provider can help you determine the allergen or allergens that trigger your symptoms. If you and your health care provider are unable to determine the allergen, skin or blood testing may be used. TREATMENT  Allergic rhinitis does not have a cure, but it can be controlled by:  Medicines and allergy shots (immunotherapy).  Avoiding the allergen. Hay fever may often be  treated with antihistamines in pill or nasal spray forms. Antihistamines block the effects of histamine. There are over-the-counter medicines that may help with nasal congestion and swelling around the eyes. Check with your health care provider before taking or giving this medicine.  If avoiding the allergen or the medicine prescribed do not work, there are many new medicines your health care provider can prescribe. Stronger medicine may be used if initial measures are ineffective. Desensitizing injections can be used if medicine and avoidance does not work. Desensitization is when a patient is given ongoing shots until the body becomes less sensitive to the allergen. Make sure you follow up with your health care provider if problems continue. HOME CARE INSTRUCTIONS It is not possible to completely avoid allergens, but you can reduce your symptoms by taking steps to limit your exposure to them. It helps to know exactly what you are allergic to so that you can avoid your specific triggers. SEEK MEDICAL CARE IF:   You have a fever.  You develop a cough that does not stop easily (persistent).  You have shortness of breath.  You start wheezing.  Symptoms interfere with normal daily activities. Document Released: 10/19/2000 Document Revised: 01/29/2013 Document Reviewed: 10/01/2012 [facility] Patient Information 2015 [place], [place]. This information is not intended to replace advice given to you by your health care provider. Make sure you discuss any questions you have with  your health care provider.

## 2013-10-30 NOTE — Progress Notes (Signed)
Subjective:    Patient ID: [name], female    DOB: [birth year], 79 y.o.   MRN: [id number]  Chief complaint: Excess wax buildup  NWG:[id number], Hilliard Clark, MD  Gastroenterologist: Deatra Ina  Patient Active Problem List   Diagnosis Date Noted  . Hematest positive stools 08/05/2013  . Anemia due to other cause 08/05/2013  . Cerumen impaction 06/03/2013  . History of adrenal disorder 10/15/2012  . Rash and nonspecific skin eruption 10/15/2012  . Iron deficiency anemia, unspecified 06/20/2012  . Psychogenic polydipsia 06/20/2012  . Nonspecific abnormal electrocardiogram (ECG) (EKG) 06/20/2012  . Polyuria 05/02/2012  . Pelvic pain in female 08/16/2011  . Leg ulcer 06/16/2010  . Routine general medical examination at a health care facility 06/11/2010  . Encounter for long-term (current) use of other medications 06/11/2010  . HYPERCHOLESTEROLEMIA 06/05/2009  . UNSPECIFIED URINARY CALCULUS 05/28/2008  . CHEST PAIN, ATYPICAL 05/28/2008  . ABDOMINAL PAIN, CHRONIC 05/28/2008  . ASYMPTOMATIC POSTMENOPAUSAL STATUS 05/28/2008  . SOMATIZATION DISORDER 04/25/2007  . HYPOTHYROIDISM 09/21/2006  . HYPERTENSION 09/21/2006  . ALLERGIC RHINITIS 09/21/2006  . ASTHMA 09/21/2006  . GERD 09/21/2006  . RENAL INSUFFICIENCY 09/21/2006  . DIVERTICULITIS, HX OF 09/21/2006     HPI  Per patient, this problem has been present for five months, and occurred when her PCP was attempting to clean her ears out with a cotton swab, and she feels that some of the cotton may be retained in her right ear.  She denies vertigo, stating that she has had true vertigo before. She reports a history of ASA allergy, eczema, and asthma.  She has never used a intranasal steroid.  She denies dysuria, frequency, and urgency at this time.  She denies difficulty breathing and cough.  She denies rash at this time.    The patient has an ongoing history of fatigue, which has been fairly stable over the last couple of months.  She reports  that she was asked to report the the ED by her PCP recently, after a home health assessment by a nursing report to the PCP for a laundry list of complaints.     Review of Systems  Constitutional: Negative.   Eyes: Negative.   Respiratory: Negative.   Genitourinary: Negative.   Skin: Negative.   Neurological: Negative for facial asymmetry, speech difficulty, weakness, light-headedness, numbness and headaches.   History   Social History  . Marital Status: Divorced    Spouse Name: N/A    Number of Children: N/A  . Years of Education: N/A   Occupational History  . Not on file.   Social History Main Topics  . Smoking status: Never Smoker   . Smokeless tobacco: Never Used  . Alcohol Use: No  . Drug Use: No  . Sexual Activity: No   Other Topics Concern  . Not on file   Social History Narrative  . No narrative on file   Prior to Admission medications   Medication Sig Start Date End Date Taking? Authorizing Provider  cyanocobalamin 500 MCG tablet Take 500 mcg by mouth daily.   Yes Historical Provider, MD  Ferrous Sulfate (IRON) 325 (65 FE) MG TABS Take 1 tablet by mouth as needed.   Yes Historical Provider, MD  FLOVENT HFA 110 MCG/ACT inhaler INHALE 2 PUFF TWICE DAILY 10/07/13  Yes Renato Shin, MD  levothyroxine (SYNTHROID, LEVOTHROID) 50 MCG tablet Take 1 tablet (50 mcg total) by mouth daily before breakfast. 06/21/13  Yes Renato Shin, MD  magnesium 30 MG tablet Take 30  mg by mouth 2 (two) times daily.   Yes Historical Provider, MD  Multiple Vitamin (MULTIVITAMIN) tablet Take 1 tablet by mouth daily.     Yes Historical Provider, MD       Objective:   Physical Exam  Constitutional: She is oriented to person, place, and time. She appears well-developed and well-nourished.  HENT:  Head: Normocephalic.  Right Ear: External ear normal. No drainage. No foreign bodies. No middle ear effusion. Decreased hearing (Relative to left ear (to finger rub)) is noted.  Left Ear: External  ear normal. No drainage. No foreign bodies.  No middle ear effusion. No decreased hearing is noted.  Nose: No nasal deformity or septal deviation.  Mouth/Throat: Uvula is midline, oropharynx is clear and moist and mucous membranes are normal.  Eyes: Conjunctivae are normal. Right eye exhibits no discharge. Left eye exhibits no discharge.  Cardiovascular: Normal rate, regular rhythm and normal heart sounds.   Pulmonary/Chest: Effort normal and breath sounds normal.  Lymphadenopathy:    She has no cervical adenopathy.  Neurological: She is alert and oriented to person, place, and time.  Skin: Skin is warm and dry.  Psychiatric: She has a normal mood and affect. Her behavior is normal. Judgment and thought content normal.     Filed Vitals:   10/30/13 1614  BP: 130/80  Pulse: 85  Temp: 98.3 F (36.8 C)  Resp: 16   Results for orders placed in visit on 10/30/13  POCT CBC      Result Value Ref Range   WBC 8.0  4.6 - 10.2 K/uL   Lymph, poc 2.7  0.6 - 3.4   POC LYMPH PERCENT 33.7  10 - 50 %L   MID (cbc) 1.1 (*) 0 - 0.9   POC MID % 13.2 (*) 0 - 12 %M   POC Granulocyte 4.2  2 - 6.9   Granulocyte percent 53.1  37 - 80 %G   RBC 4.18  4.04 - 5.48 M/uL   Hemoglobin 12.9  12.2 - 16.2 g/dL   HCT, POC 39.3  37.7 - 47.9 %   MCV 94.0  80 - 97 fL   MCH, POC 30.8  27 - 31.2 pg   MCHC 32.8  31.8 - 35.4 g/dL   RDW, POC 14.7     Platelet Count, POC 303  142 - 424 K/uL   MPV 7.9  0 - 99.8 fL        Assessment & Plan:    1. Eustachian tube dysfunction, bilateral - Bilateral ear irrigation to remove excess ear wax - fluticasone (FLONASE) 50 MCG/ACT nasal spray; Place 2 sprays into both nostrils daily.  Dispense: 16 g; Refill: 12 - cortisporin solution prn, should signs of infection begin to emerge   2. Allergic rhinitis, unspecified allergic rhinitis type - fluticasone (FLONASE) 50 MCG/ACT nasal spray; Place 2 sprays into both nostrils daily.  Dispense: 16 g; Refill: 12  3. Other malaise  and fatigue - POCT CBC: Normal - Follow up with PCP   The patient was instructed to to call or comeback to clinic as needed, or should symptoms warrant.  Marland Kitchen

## 2013-11-01 NOTE — Progress Notes (Signed)
Patient discussed and examined with Mr. Carlis Abbott. Agree with assessment and plan of care per Mr. Ainsley Spinner note.  I Individually obtained history, and performed exam, as well as plan formulation and discussed with patient. Curette used for some cerumen removal and lavage performed. Unknown complete details of reason home health assessment wanted follow up with PCP, but with reported internal bleeding and fatigue, CBC obtained. Reassuring as below. These symptoms have apparently been ongoing and followed by PCP as well as prior GI eval. Advised to follow up with PCP on concerns form health assessment. Some ETD component to ear sx's. flonase nasal spray as below, RTC/er precautions.

## 2013-12-25 ENCOUNTER — Ambulatory Visit (INDEPENDENT_AMBULATORY_CARE_PROVIDER_SITE_OTHER): Payer: Medicare Other | Admitting: Family Medicine

## 2013-12-25 VITALS — BP 110/68 | HR 95 | Temp 98.0°F | Resp 18 | Ht 64.5 in | Wt 135.0 lb

## 2013-12-25 DIAGNOSIS — H109 Unspecified conjunctivitis: Secondary | ICD-10-CM

## 2013-12-25 DIAGNOSIS — R21 Rash and other nonspecific skin eruption: Secondary | ICD-10-CM

## 2013-12-25 DIAGNOSIS — J4521 Mild intermittent asthma with (acute) exacerbation: Secondary | ICD-10-CM

## 2013-12-25 MED ORDER — KETOCONAZOLE 2 % EX CREA: 1.0000 "application " | TOPICAL_CREAM | Freq: Every day | CUTANEOUS | Status: DC

## 2013-12-25 MED ORDER — ALBUTEROL SULFATE HFA 108 (90 BASE) MCG/ACT IN AERS: 2.0000 | INHALATION_SPRAY | Freq: Four times a day (QID) | RESPIRATORY_TRACT | Status: DC | PRN

## 2013-12-25 MED ORDER — TOBRAMYCIN 0.3 % OP SOLN: 1.0000 [drp] | Freq: Four times a day (QID) | OPHTHALMIC | Status: DC

## 2013-12-25 NOTE — Progress Notes (Signed)
79 yo woman who underwent cataract surgery by Dr. Katy Fitch last October and December.  She was given steroid drops which were discontinued February 08, 2013  Since then she has had thick eye discharge, mostly the left eye.  She went back to Dr. Katy Fitch who did not find any problem.  She then went to her dentist because of the left sided malar discomfort, but no dental abnormality noted.  She has taken Flonase which is very painful.  Patient has had chronic asthma (intermittent inhalers) and chronic dry eyes.  8 years ago she went on Flovent which was effective.  She does not have a rescue inhaler and last night had more wheezing.  She was recently told she has internal bleeding.  Patient was reluctant to undergo endoscopy because of colonoscopic complication with partial bowel resection.  She did have endoscopy which showed a stricture.  A polyp was identified.    She then developed BRBPR and was put on metronidazole.  Her reflux worsened and she began to take ranitidine.  She developed chest tightness.  She is concerned she has developed a fungal infection.   Objective:  NAD Fundi:  Normal TM's: normal Neck:  Supple, no adenopathy Skin: mild annular hyperpigmentation at lateral canthus. Chest:  Faint exp wheeze Heart: reg, no murmur  Assessment: possible tinea corporis, somatization disorder.  Rash and nonspecific skin eruption - Plan: ketoconazole (NIZORAL) 2 % cream  Bilateral conjunctivitis - Plan: tobramycin (TOBREX) 0.3 % ophthalmic solution  Asthma with acute exacerbation, mild intermittent - Plan: albuterol (PROVENTIL HFA;VENTOLIN HFA) 108 (90 BASE) MCG/ACT inhaler  As one can tell from the numerous complaints and positive review of systems, the patient does have a tendency to focus on her multiple symptoms. Her exam is not significantly abnormal however.  Hopeful that by treating her current symptoms which are mild, patient will get some relief. I would be surprised she comes  back in a couple days however.   Signed, Robyn Haber, MD

## 2014-02-18 ENCOUNTER — Ambulatory Visit (INDEPENDENT_AMBULATORY_CARE_PROVIDER_SITE_OTHER): Payer: 59 | Admitting: Family Medicine

## 2014-02-18 ENCOUNTER — Ambulatory Visit (INDEPENDENT_AMBULATORY_CARE_PROVIDER_SITE_OTHER): Payer: 59

## 2014-02-18 VITALS — BP 110/74 | HR 82 | Temp 98.5°F | Resp 18 | Ht 65.0 in | Wt 138.0 lb

## 2014-02-18 DIAGNOSIS — J320 Chronic maxillary sinusitis: Secondary | ICD-10-CM | POA: Diagnosis not present

## 2014-02-18 DIAGNOSIS — R21 Rash and other nonspecific skin eruption: Secondary | ICD-10-CM

## 2014-02-18 LAB — POCT SKIN KOH: Skin KOH, POC: NEGATIVE

## 2014-02-18 MED ORDER — METRONIDAZOLE 1 % EX GEL: CUTANEOUS | Status: DC

## 2014-02-18 MED ORDER — BETAMETHASONE DIPROPIONATE 0.05 % EX CREA: TOPICAL_CREAM | Freq: Two times a day (BID) | CUTANEOUS | Status: DC

## 2014-02-18 MED ORDER — AMOXICILLIN-POT CLAVULANATE 875-125 MG PO TABS: 1.0000 | ORAL_TABLET | Freq: Two times a day (BID) | ORAL | Status: DC

## 2014-02-18 NOTE — Patient Instructions (Signed)
Use the metronidazole gel twice daily on the rash.  After applying the gel, then apply a small amount of the betamethasone cream also twice daily.  Take the Augmentin 1 twice daily after breakfast and supper for a sinus infection  If not improving over the next 10 days please return

## 2014-02-18 NOTE — Progress Notes (Signed)
Subjective: Patient is here with couple of main concerns. She has a lot of persistent facial pain with purulent nasal drainage especially in the morning. She also has a sore place on her left leg. She has a very long detailed history. A year ago she had bilateral cataract surgery done.  Today she used steroid drops in her eyes for couple of months. She went back to Dr. Carolynn Sayers because she continued to have facial pain behind her eyes and in her face. He did not find anything that he could help for this and told her to see her primary care doctors. She has a long history of having apparently had a little a lot of steroids at some point in the past, gotten some facial bone necrosis and lost her teeth. She has to wear upper and lower partials. She hurts in her face and jaw. This is all gotten worse over the last year with this infection whatever is going on in her sinuses. She saw Dr. Joni Fears earlier about 2 months ago and he treated her. She transiently did better but came back in here to see him today. Unfortunately we had switched schedule so he did not see her today.  She had a upper endoscopy and colonoscopy last summer and was treated afterwards for something with metronidazole. She later found out that mentioned is all had antiparasitic properties, and was concerned that they might of been treating her for parasites. I tried to explain to her how they use this medication also for anaerobic infections. Looking at the old studies he does appear that she had had GI bleeding which is why she had the endoscopies. She had no major lesions found the there was some mild abnormality that may have been the cause of the bleeding. She sees several doctors on a regular basis.  She lives all, wishes she felt better.  Objective: Alert and oriented. TMs are normal. She apparently had a piece of cotton left ear ear canal that somebody removed several months ago. There is only a little area of erythema on the posterior  aspect of the left ear canal, probably where she has tried to scratch her ears. Almost no wax. Eyes PERRLA. Fundi benign. Lenses look clear. Throat clear. Nose looks clear with no discharge at this time the date. Did do a nasal swab per her request. Supple with out significant nodes. She has a little tenderness of her maxillary sinuses more than the frontal sinuses. Not very tender however. Her chest is clear to auscultation. She has a history of COPD, and seems to have somewhat poor air exchange. Heart regular without murmurs. She has an erythematous area about 2 seems in diameter, scaly without drainage, on the left shin. It is almost psoriatic in appearance. A scraping was done of this. She is used numerous creams on it and has seen a dermatologist in the past. She has had previous biopsy done of an ulcer which apparently was nonspecific.  Assessment: Sinus drainage Facial pain COPD Chronic painful dermatosis left shin  Plan: Nasal culture Single view of sinuses Skin scraping  Results for orders placed or performed in visit on 02/18/14  POCT Skin KOH  Result Value Ref Range   Skin KOH, POC Negative    [facility] reading (PRIMARY) by  Dr. Elberta Spaniel right maxillary sinus and a little possibly at the base of the left maxillary sinus. Frontal sinuses appear clear. Probable right maxillary sinusitis.  If problems continue to persist might need a CT scan.  But we will go ahead and treat her at this time. Augmentin 875 mg twice daily for 10 days. Recheck if worse   Because of the history that the metronidazole orally seem to help the rash cleared up for a while, we will use some metronidazole gel as well as some topical betamethasone on the rash. If it is not improving may need to have punch biopsy repeated or refer her to dermatology again  Patient says she cannot take probiotics.

## 2014-02-21 DIAGNOSIS — H10413 Chronic giant papillary conjunctivitis, bilateral: Secondary | ICD-10-CM | POA: Diagnosis not present

## 2014-02-21 DIAGNOSIS — H5712 Ocular pain, left eye: Secondary | ICD-10-CM | POA: Diagnosis not present

## 2014-02-21 DIAGNOSIS — Z961 Presence of intraocular lens: Secondary | ICD-10-CM | POA: Diagnosis not present

## 2014-02-21 DIAGNOSIS — H04123 Dry eye syndrome of bilateral lacrimal glands: Secondary | ICD-10-CM | POA: Diagnosis not present

## 2014-02-21 LAB — MRSA CULTURE

## 2014-03-05 ENCOUNTER — Other Ambulatory Visit: Payer: Self-pay

## 2014-03-05 MED ORDER — FLUTICASONE PROPIONATE HFA 110 MCG/ACT IN AERO: INHALATION_SPRAY | RESPIRATORY_TRACT | Status: DC

## 2014-06-01 ENCOUNTER — Other Ambulatory Visit: Payer: Self-pay | Admitting: Endocrinology

## 2014-06-02 NOTE — Telephone Encounter (Signed)
Please advise if ok to refill rx. Patient has note been since 10/03/2013. Thanks!

## 2014-07-01 ENCOUNTER — Other Ambulatory Visit: Payer: Self-pay

## 2014-07-01 DIAGNOSIS — Z1231 Encounter for screening mammogram for malignant neoplasm of breast: Secondary | ICD-10-CM

## 2014-07-10 ENCOUNTER — Ambulatory Visit
Admission: RE | Admit: 2014-07-10 | Discharge: 2014-07-10 | Disposition: A | Discharge status: Home / Self Care | Payer: Medicare Other | Source: Ambulatory Visit

## 2014-07-10 DIAGNOSIS — Z1231 Encounter for screening mammogram for malignant neoplasm of breast: Secondary | ICD-10-CM

## 2014-07-18 ENCOUNTER — Encounter: Payer: Self-pay | Admitting: Endocrinology

## 2014-07-18 ENCOUNTER — Ambulatory Visit (INDEPENDENT_AMBULATORY_CARE_PROVIDER_SITE_OTHER): Payer: Medicare Other | Admitting: Endocrinology

## 2014-07-18 ENCOUNTER — Telehealth: Payer: Self-pay | Admitting: Endocrinology

## 2014-07-18 VITALS — BP 120/60 | HR 79 | Temp 98.5°F | Ht 65.0 in | Wt 137.0 lb

## 2014-07-18 DIAGNOSIS — E039 Hypothyroidism, unspecified: Secondary | ICD-10-CM | POA: Diagnosis not present

## 2014-07-18 DIAGNOSIS — E7800 Pure hypercholesterolemia, unspecified: Secondary | ICD-10-CM

## 2014-07-18 DIAGNOSIS — N259 Disorder resulting from impaired renal tubular function, unspecified: Secondary | ICD-10-CM

## 2014-07-18 DIAGNOSIS — D6489 Other specified anemias: Secondary | ICD-10-CM

## 2014-07-18 DIAGNOSIS — I10 Essential (primary) hypertension: Secondary | ICD-10-CM | POA: Diagnosis not present

## 2014-07-18 DIAGNOSIS — N951 Menopausal and female climacteric states: Secondary | ICD-10-CM | POA: Insufficient documentation

## 2014-07-18 DIAGNOSIS — Z0000 Encounter for general adult medical examination without abnormal findings: Secondary | ICD-10-CM | POA: Diagnosis not present

## 2014-07-18 DIAGNOSIS — Z23 Encounter for immunization: Secondary | ICD-10-CM

## 2014-07-18 DIAGNOSIS — D509 Iron deficiency anemia, unspecified: Secondary | ICD-10-CM | POA: Diagnosis not present

## 2014-07-18 LAB — BASIC METABOLIC PANEL
BUN: 16 mg/dL (ref 6–23)
CO2: 30 mEq/L (ref 19–32)
Calcium: 9.9 mg/dL (ref 8.4–10.5)
Chloride: 99 mEq/L (ref 96–112)
Creatinine, Ser: 0.67 mg/dL (ref 0.40–1.20)
GFR: 90.41 mL/min (ref >60.00)
GLUCOSE: 91 mg/dL (ref 70–99)
Potassium: 4.1 mEq/L (ref 3.5–5.1)
SODIUM: 133 meq/L — AB (ref 135–145)

## 2014-07-18 LAB — HEPATIC FUNCTION PANEL
ALK PHOS: 45 U/L (ref 39–117)
ALT: 13 U/L (ref 0–35)
AST: 23 U/L (ref 0–37)
Albumin: 4.2 g/dL (ref 3.5–5.2)
Bilirubin, Direct: 0.1 mg/dL (ref 0.0–0.3)
TOTAL PROTEIN: 7.1 g/dL (ref 6.0–8.3)
Total Bilirubin: 0.4 mg/dL (ref 0.2–1.2)

## 2014-07-18 LAB — CBC WITH DIFFERENTIAL/PLATELET
BASOS ABS: 0.1 10*3/uL (ref 0.0–0.1)
Basophils Relative: 0.7 % (ref 0.0–3.0)
Eosinophils Absolute: 0.7 10*3/uL (ref 0.0–0.7)
Eosinophils Relative: 9.1 % — ABNORMAL HIGH (ref 0.0–5.0)
HEMATOCRIT: 37.6 % (ref 36.0–46.0)
Hemoglobin: 12.6 g/dL (ref 12.0–15.0)
Lymphocytes Relative: 23.2 % (ref 12.0–46.0)
Lymphs Abs: 1.7 10*3/uL (ref 0.7–4.0)
MCHC: 33.5 g/dL (ref 30.0–36.0)
MCV: 95.2 fl (ref 78.0–100.0)
MONO ABS: 0.9 10*3/uL (ref 0.1–1.0)
Monocytes Relative: 12.6 % — ABNORMAL HIGH (ref 3.0–12.0)
Neutro Abs: 4 10*3/uL (ref 1.4–7.7)
Neutrophils Relative %: 54.4 % (ref 43.0–77.0)
PLATELETS: 311 10*3/uL (ref 150.0–400.0)
RBC: 3.95 Mil/uL (ref 3.87–5.11)
RDW: 13.6 % (ref 11.5–15.5)
WBC: 7.4 10*3/uL (ref 4.0–10.5)

## 2014-07-18 LAB — URINALYSIS, ROUTINE W REFLEX MICROSCOPIC
Bilirubin Urine: NEGATIVE
Hgb urine dipstick: NEGATIVE
KETONES UR: NEGATIVE
LEUKOCYTES UA: NEGATIVE
Nitrite: NEGATIVE
PH: 7 (ref 5.0–8.0)
SPECIFIC GRAVITY, URINE: 1.01 (ref 1.000–1.030)
Total Protein, Urine: NEGATIVE
URINE GLUCOSE: NEGATIVE
Urobilinogen, UA: 0.2 (ref 0.0–1.0)

## 2014-07-18 LAB — IBC PANEL
Iron: 96 ug/dL (ref 42–145)
SATURATION RATIOS: 32 % (ref 20.0–50.0)
TRANSFERRIN: 214 mg/dL (ref 212.0–360.0)

## 2014-07-18 LAB — LIPID PANEL
Cholesterol: 183 mg/dL (ref 0–200)
HDL: 56 mg/dL (ref >39.00)
LDL CALC: 88 mg/dL (ref 0–99)
NonHDL: 127
TRIGLYCERIDES: 194 mg/dL — AB (ref 0.0–149.0)
Total CHOL/HDL Ratio: 3
VLDL: 38.8 mg/dL (ref 0.0–40.0)

## 2014-07-18 NOTE — Patient Instructions (Addendum)
please consider these measures for your health:  minimize alcohol.  do not use tobacco products.  have a colonoscopy at least every 10 years from age 78.  Women should have an annual mammogram from age 13.  keep firearms safely stored.  always use seat belts.  have working smoke alarms in your home.  see an eye doctor and dentist regularly.  never drive under the influence of alcohol or drugs (including prescription drugs).  those with fair skin should take precautions against the sun. it is critically important to prevent falling down (keep floor areas well-lit, dry, and free of loose objects.  If you have a cane, walker, or wheelchair, you should use it, even for short trips around the house.  Also, try not to rush).   Try a tiny amount of baking soda for your heartburn.  blood tests are requested for you today.  We'll let you know about the results. Please come back for a follow-up appointment in 6 months.

## 2014-07-18 NOTE — Progress Notes (Signed)
we discussed code status.  pt requests full code, but would not want to be started or maintained on artificial life-support measures if there was not a reasonable chance of recovery 

## 2014-07-18 NOTE — Progress Notes (Signed)
Subjective:    Patient ID: [name], female    DOB: Apr 09, 1934, 80 y.o.   MRN: [id number]  HPI Pt is here for regular wellness examination, and is feeling pretty well in general, and says chronic med probs are stable, except as noted below.   Past Medical History  Diagnosis Date  . HYPOTHYROIDISM 09/21/2006  . HYPERCHOLESTEROLEMIA 06/05/2009  . ANEMIA-NOS 09/21/2006  . Somatization disorder 04/25/2007  . HYPERTENSION 09/21/2006  . ALLERGIC RHINITIS 09/21/2006  . ASTHMA 09/21/2006  . GERD 09/21/2006  . RENAL INSUFFICIENCY 09/21/2006  . UNSPECIFIED URINARY CALCULUS 05/28/2008  . CHEST PAIN, ATYPICAL 05/28/2008  . ABDOMINAL PAIN, CHRONIC 05/28/2008  . DIVERTICULITIS, HX OF 09/21/2006  . ASYMPTOMATIC POSTMENOPAUSAL STATUS 05/28/2008  . DDD (degenerative disc disease)   . Cataract   . COPD (chronic obstructive pulmonary disease)   . Small bowel obstruction   . Peptic ulcer disease     Past Surgical History  Procedure Laterality Date  . Nasal sinus surgery  1967  . Dental surgery  1970  . Appendectomy  1959  . Cholecystectomy  1983  . Abdominal hysterectomy  1981  . Pulmonary thrombosis    . Tonsillectomy    . Asthma & pneumonia birth  50's  . Acute nephritis  1958  . Tubal ligation    . Cesarean section    . Colon surgery      History   Social History  . Marital Status: Divorced    Spouse Name: N/A  . Number of Children: N/A  . Years of Education: N/A   Occupational History  . Not on file.   Social History Main Topics  . Smoking status: Never Smoker   . Smokeless tobacco: Never Used  . Alcohol Use: No  . Drug Use: No  . Sexual Activity: No   Other Topics Concern  . Not on file   Social History Narrative    Current Outpatient Prescriptions on File Prior to Visit  Medication Sig Dispense Refill  . betamethasone dipropionate (DIPROLENE) 0.05 % cream Apply topically 2 (two) times daily. Use twice daily on rash on shin 15 g 1  . cyanocobalamin 500 MCG tablet  Take 500 mcg by mouth daily.    . Ferrous Sulfate (IRON) 325 (65 FE) MG TABS Take 1 tablet by mouth as needed.    Marland Kitchen FLOVENT HFA 110 MCG/ACT inhaler INHALE 2 PUFF TWICE DAILY 12 Inhaler 4  . ketoconazole (NIZORAL) 2 % cream Apply 1 application topically daily. 15 g 0  . levothyroxine (SYNTHROID, LEVOTHROID) 50 MCG tablet TAKE 1 TABLET (50 MCG TOTAL) BY MOUTH DAILY BEFORE BREAKFAST. 90 tablet 3  . magnesium 30 MG tablet Take 30 mg by mouth 2 (two) times daily.    . Multiple Vitamin (MULTIVITAMIN) tablet Take 1 tablet by mouth daily.       No current facility-administered medications on file prior to visit.    Allergies  Allergen Reactions  . Metronidazole Shortness Of Breath and Nausea And Vomiting  . Macrobid [Nitrofurantoin]     REACTION: Syncope  . Pepcid [Famotidine]     Family History  Problem Relation Age of Onset  . Cancer Mother   . Cancer Father     BP 120/60 mmHg  Pulse 79  Temp(Src) 98.5 F (36.9 C) (Oral)  Ht 5' 5"  (1.651 m)  Wt 137 lb (62.143 kg)  BMI 22.80 kg/m2  SpO2 96%   Review of Systems  Constitutional: Negative for fever.  HENT: Negative for  dental problem.   Eyes: Negative for photophobia.  Respiratory: Negative for shortness of breath.   Gastrointestinal: Negative for anal bleeding.  Endocrine: Positive for cold intolerance.  Genitourinary: Negative for hematuria.  Musculoskeletal: Negative for gait problem.  Skin: Negative for wound.  Allergic/Immunologic: Positive for environmental allergies.  Neurological: Negative for syncope.  Hematological: Bruises/bleeds easily.  Psychiatric/Behavioral: Negative for dysphoric mood.       Objective:   Physical Exam VS: see vs page GEN: no distress HEAD: head: no deformity eyes: no periorbital swelling, no proptosis external nose and ears are normal mouth: no lesion seen NECK: supple, thyroid is not enlarged CHEST WALL: no deformity LUNGS:  Clear to auscultation CV: reg rate and rhythm, no  murmur. ffew varicosities on the ankles. ABD: abdomen is soft, nontender.  no hepatosplenomegaly.  not distended.  no hernia MUSCULOSKELETAL: muscle bulk and strength are grossly normal.  no obvious joint swelling.  gait is normal and steady EXTEMITIES: no deformity.  no ulcer on the feet.  feet are of normal color and temp.  no edema.  Right great toenail is absent.   PULSES: dorsalis pedis intact bilat.  no carotid bruit NEURO:  cn 2-12 grossly intact.   readily moves all 4's.  sensation is intact to touch on the feet SKIN:  Normal texture and temperature.  No rash or suspicious lesion is visible.  Few ecchymoses of the forearms.   NODES:  None palpable at the neck.   PSYCH: alert, well-oriented.  Does not appear anxious nor depressed.      Assessment & Plan:  Wellness visit today, with problems stable, except as noted.     Subjective:   Patient here for Medicare annual wellness visit and management of other chronic and acute problems.     Risk factors: advanced age    15 of Physicians Providing Medical Care to Patient:  See "snapshot"   Activities of Daily Living: In your present state of health, do you have any difficulty performing the following activities?:  Preparing food and eating?: No  Bathing yourself: No  Getting dressed: No  Using the toilet:No  Moving around from place to place: No  In the past year have you fallen or had a near fall?: No    Home Safety: Has smoke detector and wears seat belts. No firearms. No excess sun exposure.  Diet and Exercise  Current exercise habits: pt says good Dietary issues discussed: pt reports a healthy diet   Depression Screen  Q1: Over the past two weeks, have you felt down, depressed or hopeless? no  Q2: Over the past two weeks, have you felt little interest or pleasure in doing things? no   The following portions of the patient's history were reviewed and updated as appropriate: allergies, current medications, past family  history, past medical history, past social history, past surgical history and problem list.   Review of Systems  Denies hearing loss, and visual loss.   Objective:   Vision:  Sees opthalmologist Dr Katy Fitch Hearing: grossly normal Body mass index:  See vs page Msk: pt easily and quickly performs "get-up-and-go" from a sitting position Cognitive Impairment Assessment: cognition, memory and judgment appear normal.  remembers 3/3 at 5 minutes.  excellent recall.  can easily read and write a sentence.  alert and oriented x 3.     Assessment:   Medicare wellness utd on preventive parameters    Plan:   During the course of the visit the patient was educated and  counseled about appropriate screening and preventive services including:       Fall prevention   Screening mammography  Bone densitometry screening  Diabetes screening  Nutrition counseling   Vaccines / LABS Zostavax / Pneumococcal Vaccine  today   Patient Instructions (the written plan) was given to the patient.     SEPARATE EVALUATION FOLLOWS--EACH PROBLEM HERE IS NEW, NOT RESPONDING TO TREATMENT, OR POSES SIGNIFICANT RISK TO THE PATIENT'S HEALTH: HISTORY OF THE PRESENT ILLNESS: Heartburn persists. She does not tolerate H2 blockers or PPI's PAST MEDICAL HISTORY reviewed and up to date today REVIEW OF SYSTEMS: Denies weight change PHYSICAL EXAMINATION: VITAL SIGNS:  See vs page GENERAL: no distress ABDOMEN: abdomen is soft, nontender.  no hepatosplenomegaly.  not distended.  no hernia ECG:  i personally reviewed electrocardiogram tracing: normal IMPRESSION: Heartburn, persistent despite rx PLAN:  Try a tiny amount of baking soda for your heartburn.

## 2014-07-18 NOTE — Telephone Encounter (Signed)
FYI  Once the pt's labs from today result she would like them to be mailed to her house

## 2014-07-21 ENCOUNTER — Other Ambulatory Visit: Payer: Self-pay | Admitting: Endocrinology

## 2014-07-21 DIAGNOSIS — E871 Hypo-osmolality and hyponatremia: Secondary | ICD-10-CM | POA: Insufficient documentation

## 2014-07-21 DIAGNOSIS — E039 Hypothyroidism, unspecified: Secondary | ICD-10-CM

## 2014-07-21 LAB — TSH: TSH: 6.28 u[IU]/mL — AB (ref 0.35–4.50)

## 2014-07-21 MED ORDER — LEVOTHYROXINE SODIUM 75 MCG PO TABS: 75.0000 ug | ORAL_TABLET | Freq: Every day | ORAL | Status: DC

## 2014-07-21 NOTE — Telephone Encounter (Signed)
Once labs are resulted, results will be mailed.

## 2014-07-24 ENCOUNTER — Ambulatory Visit (INDEPENDENT_AMBULATORY_CARE_PROVIDER_SITE_OTHER)
Admission: RE | Admit: 2014-07-24 | Discharge: 2014-07-24 | Disposition: A | Discharge status: Home / Self Care | Payer: Medicare Other | Source: Ambulatory Visit | Attending: Endocrinology | Admitting: Endocrinology

## 2014-07-24 DIAGNOSIS — Z13820 Encounter for screening for osteoporosis: Secondary | ICD-10-CM

## 2014-07-24 DIAGNOSIS — N951 Menopausal and female climacteric states: Secondary | ICD-10-CM

## 2014-08-21 ENCOUNTER — Other Ambulatory Visit (INDEPENDENT_AMBULATORY_CARE_PROVIDER_SITE_OTHER): Payer: Medicare Other

## 2014-08-21 DIAGNOSIS — E871 Hypo-osmolality and hyponatremia: Secondary | ICD-10-CM

## 2014-08-21 DIAGNOSIS — E039 Hypothyroidism, unspecified: Secondary | ICD-10-CM | POA: Diagnosis not present

## 2014-08-21 LAB — BASIC METABOLIC PANEL
BUN: 13 mg/dL (ref 6–23)
CALCIUM: 10 mg/dL (ref 8.4–10.5)
CO2: 29 mEq/L (ref 19–32)
CREATININE: 0.61 mg/dL (ref 0.40–1.20)
Chloride: 101 mEq/L (ref 96–112)
GFR: 100.73 mL/min (ref >60.00)
Glucose, Bld: 92 mg/dL (ref 70–99)
Potassium: 4.1 mEq/L (ref 3.5–5.1)
Sodium: 135 mEq/L (ref 135–145)

## 2014-08-21 LAB — TSH: TSH: 0.54 u[IU]/mL (ref 0.35–4.50)

## 2014-08-28 ENCOUNTER — Telehealth: Payer: Self-pay | Admitting: Endocrinology

## 2014-08-28 NOTE — Telephone Encounter (Signed)
[name] with [facility] [phone] Calling to inform Dr. Loanne Drilling that she saw pt yesterday she complained of uncontrolled pain in both feet, ?neuropathy

## 2014-10-14 ENCOUNTER — Other Ambulatory Visit: Payer: Self-pay

## 2014-10-14 MED ORDER — LEVOTHYROXINE SODIUM 75 MCG PO TABS: 75.0000 ug | ORAL_TABLET | Freq: Every day | ORAL | Status: DC

## 2014-11-03 ENCOUNTER — Ambulatory Visit (INDEPENDENT_AMBULATORY_CARE_PROVIDER_SITE_OTHER): Payer: Medicare Other

## 2014-11-03 ENCOUNTER — Ambulatory Visit (INDEPENDENT_AMBULATORY_CARE_PROVIDER_SITE_OTHER): Payer: Medicare Other | Admitting: Family Medicine

## 2014-11-03 VITALS — BP 130/82 | HR 64 | Temp 98.2°F | Resp 16 | Ht 65.0 in | Wt 135.0 lb

## 2014-11-03 DIAGNOSIS — E039 Hypothyroidism, unspecified: Secondary | ICD-10-CM | POA: Diagnosis not present

## 2014-11-03 DIAGNOSIS — R079 Chest pain, unspecified: Secondary | ICD-10-CM | POA: Diagnosis not present

## 2014-11-03 DIAGNOSIS — K5790 Diverticulosis of intestine, part unspecified, without perforation or abscess without bleeding: Secondary | ICD-10-CM

## 2014-11-03 DIAGNOSIS — K449 Diaphragmatic hernia without obstruction or gangrene: Secondary | ICD-10-CM

## 2014-11-03 DIAGNOSIS — M79669 Pain in unspecified lower leg: Secondary | ICD-10-CM | POA: Diagnosis not present

## 2014-11-03 DIAGNOSIS — M25519 Pain in unspecified shoulder: Secondary | ICD-10-CM

## 2014-11-03 DIAGNOSIS — K222 Esophageal obstruction: Secondary | ICD-10-CM | POA: Diagnosis not present

## 2014-11-03 LAB — POCT CBC
Granulocyte percent: 57.2 %G (ref 37–80)
HCT, POC: 37.5 % — AB (ref 37.7–47.9)
Hemoglobin: 11.8 g/dL — AB (ref 12.2–16.2)
Lymph, poc: 2.8 (ref 0.6–3.4)
MCH, POC: 29.1 pg (ref 27–31.2)
MCHC: 31.4 g/dL — AB (ref 31.8–35.4)
MCV: 92.5 fL (ref 80–97)
MID (cbc): 0.9 (ref 0–0.9)
MPV: 7.8 fL (ref 0–99.8)
POC Granulocyte: 4.9 (ref 2–6.9)
POC LYMPH PERCENT: 32.4 % (ref 10–50)
POC MID %: 10.4 % (ref 0–12)
Platelet Count, POC: 354 10*3/uL (ref 142–424)
RBC: 4.06 M/uL (ref 4.04–5.48)
RDW, POC: 13.4 %
WBC: 8.5 10*3/uL (ref 4.6–10.2)

## 2014-11-03 NOTE — Patient Instructions (Signed)

## 2014-11-03 NOTE — Progress Notes (Signed)
Chief Complaint:  Chief Complaint  Patient presents with  . Shortness of Breath    Onset 2 months/ after pnuemonia shot  . Knee Pain    Left, onset 2 months  . Calf pain    Onset 2 months  . joint pain    especially in hand and wrists  . Heart beat feels irregular    2 months    HPI: [name] is a 80 y.o. female who reports to [facility] today complaining of  Multiple issues that are possibly realted but I am not sure. She thinks all here sxs stemmed from the [place] vaccine, which she states she had similar issues in thepast when she got it at age 46, and 53 sxs feels similar to what she is experiencing now and it took her "months" to get over. She has had since the PNA vaccine on June 10,  intermittent sxs of Numbness in the face, pain in the shoulder and her legs and feet hurt.  Her left leg  Hurts, she is worried about a clot but it is not warm, not red and not swollen, it just hurts. She has asthma, COPD so she is used to being SOB. Her diastolic BP is below 60, her machine states she has an "irregular heart beat". She would have both pain and numbness, She has had this since June 10, she can't take anything for pain , no tylenol or NSAIDs, she has GI strictures , she does not feel good. She had the PNA vaccine and feels it. Within the last 3 days she has been a miserable. She has had acute sxs in the last 5 days. She has had CP , no worsening sob , maybe palpitations, she has diffuse joint pain, her left knee hurts so bad she went to the drug store and got a knee brace. Her knee is going numb. She did not wear any brace today. When she makes certain moves then she has pain in bialteral wrist, they are sharp pain. No inflammation or pain in her shoulders or knees. She feels hot and tingling left leg but no redness, she would be doing nothing and it hurts. She had similar sxs when she ahd a left leg DVT. She has not recently taken any long car rides, plane rides, has not had recent  trauma or surgeries. She states she may have had Rushie Nyhan with the flu vaccine but not sure about the PNA vaccine. She has had dizziness, slightly light headed, no provoking factors. He has had a hx of vertigo. Feels different from her vertigo.   Denies diaphoresis, n/v/abd pain. Currently no CP  She has not felt well in along time According to the patient, She was told by a staff member at Dr Cordelia Pen office  That the rash that she had , and the treatment that she had was for a parasite, she was on the abx for several days and felt great during that time but now has gotten back to her old tired self.  ( I am not able to verify this at all from 10/2012) She states that it probably would not be in the chart. She states that she has a long hx with Dr Loanne Drilling  She has had colonscopy in 2015 for Heme + stool, showed diverticulosis  Last echo 2015: Study Conclusions  - Left ventricle: The cavity size was normal. Wall thickness was normal. Systolic function was normal. The estimated ejection fraction was in  the range of 50% to 55%. Wall motion was normal; there were no regional wall motion abnormalities. Doppler parameters are consistent with abnormal left ventricular relaxation (grade 1 diastolic dysfunction). - Aortic valve: Trivial regurgitation. - Mitral valve: Mild regurgitation.  PRior to this she was seen in 2008 at [facility] by Dr Velora Heckler for atypical CP but nothing conclusive came out of that visit. PLease see consult note below.   [name], [name] MRN: [id number] DATE:05/09/2006 DOB: [birth year]   Margaret. [name] is a very pleasant 80 year old white female with atypical chest pain, dyspnea on exertion, who was seen previously with borderline stress Cardiolite and a negative stress echo. The patient states that this past summer after colonoscopy, which revealed diverticulosis,  she developed small bowel obstruction and had to have partial resection of the small bowel. She states that she has now recovered from that however remaining quite weak, exhausted with chest pain and shortness of breath. She had been seen by Dr. Melvyn Novas who is quite concerned about the fact that she is taking theophylline. Also, seen by Dr. Loanne Drilling. Dr. Melvyn Novas is to see her next week.  MEDICATIONS: 1. Aspirin 81. 2. Symbicort 80/4.5 b.i.d. 3. Theophylline 50 b.i.d. 4. Levothyroxine 50.  Blood pressure 124/76. Pulse 80 and normal sinus rhythm. GENERAL APPEARANCE: Unremarkable and in no distress. JVP is not elevated. Carotid pulse palpable without bruits. LUNGS: Clear. CARDIAC: Reveals no murmur, gallop, rub. ABDOMEN: Normal. EXTREMITIES: Normal.  DIAGNOSES: 1. Atypical chest pain with shortness of breath. 2. History of small bowel obstruction requiring partial resection. 3. Asthma.  I have suggested that the patient followup with Dr. Melvyn Novas and discontinue the theophylline as he recommended.  Although I think her symptoms are quite atypical, I have suggested a followup stress echo. Should note that her EKG was normal sinus rhythm minor nonspecific ST changes.    Signa Kell, MD, [facility] Electronically Signed   EJL/MedQ DD: 05/09/2006 DT: 05/09/2006 Job #: [id number]   BP Readings from Last 3 Encounters:  11/03/14 130/82  07/18/14 120/60  02/18/14 110/74   Lab Results  Component Value Date   HGBA1C 5.6 06/21/2013   Lab Results  Component Value Date   LDLCALC 88 07/18/2014   CREATININE 0.61 08/21/2014   PF Readings from Last 3 Encounters:  No data found for PF   SpO2 Readings from Last 3 Encounters:  11/03/14 96%  07/18/14 96%  02/18/14 96%     Past Medical History  Diagnosis Date  . HYPOTHYROIDISM 09/21/2006  . HYPERCHOLESTEROLEMIA 06/05/2009  . ANEMIA-NOS 09/21/2006  . Somatization disorder 04/25/2007  .  HYPERTENSION 09/21/2006  . ALLERGIC RHINITIS 09/21/2006  . ASTHMA 09/21/2006  . GERD 09/21/2006  . RENAL INSUFFICIENCY 09/21/2006  . UNSPECIFIED URINARY CALCULUS 05/28/2008  . CHEST PAIN, ATYPICAL 05/28/2008  . ABDOMINAL PAIN, CHRONIC 05/28/2008  . DIVERTICULITIS, HX OF 09/21/2006  . ASYMPTOMATIC POSTMENOPAUSAL STATUS 05/28/2008  . DDD (degenerative disc disease)   . Cataract   . COPD (chronic obstructive pulmonary disease)   . Small bowel obstruction   . Peptic ulcer disease    Past Surgical History  Procedure Laterality Date  . Nasal sinus surgery  1967  . Dental surgery  1970  . Appendectomy  1959  . Cholecystectomy  1983  . Abdominal hysterectomy  1981  . Pulmonary thrombosis    . Tonsillectomy    . Asthma & pneumonia birth  75's  . Acute nephritis  1958  . Tubal ligation    . Cesarean section    .  Colon surgery     Social History   Social History  . Marital Status: Divorced    Spouse Name: N/A  . Number of Children: N/A  . Years of Education: N/A   Social History Main Topics  . Smoking status: Never Smoker   . Smokeless tobacco: Never Used  . Alcohol Use: No  . Drug Use: No  . Sexual Activity: No   Other Topics Concern  . None   Social History Narrative   Family History  Problem Relation Age of Onset  . Cancer Mother   . Cancer Father    Allergies  Allergen Reactions  . Metronidazole Shortness Of Breath and Nausea And Vomiting  . Macrobid [Nitrofurantoin]     REACTION: Syncope  . Pepcid [Famotidine]    Prior to Admission medications   Medication Sig Start Date End Date Taking? Authorizing Provider  cyanocobalamin 500 MCG tablet Take 500 mcg by mouth daily.   Yes Historical Provider, MD  Ferrous Sulfate (IRON) 325 (65 FE) MG TABS Take 1 tablet by mouth as needed.   Yes Historical Provider, MD  FLOVENT HFA 110 MCG/ACT inhaler INHALE 2 PUFF TWICE DAILY 06/02/14  Yes Renato Shin, MD  levothyroxine (SYNTHROID, LEVOTHROID) 75 MCG tablet Take 1 tablet (75 mcg  total) by mouth daily before breakfast. 10/14/14  Yes Renato Shin, MD  magnesium 30 MG tablet Take 30 mg by mouth 2 (two) times daily.   Yes Historical Provider, MD  Multiple Vitamin (MULTIVITAMIN) tablet Take 1 tablet by mouth daily.     Yes Historical Provider, MD     ROS: The patient denies fevers, chills, night sweats, unintentional weight loss, wheezing, dyspnea on exertion, nausea, vomiting, abdominal pain, dysuria, hematuria, melena,  or tingling.  All other systems have been reviewed and were otherwise negative with the exception of those mentioned in the HPI and as above.    PHYSICAL EXAM: Filed Vitals:   11/03/14 1636  BP: 130/82  Pulse: 64  Temp: 98.2 F (36.8 C)  Resp: 16   Body mass index is 22.47 kg/(m^2).   General: Alert, no acute distress HEENT:  Normocephalic, atraumatic, oropharynx patent. EOMI, PERRLA, fundo exam grossly normal , TM normal Cardiovascular:  Regular rate and rhythm, no rubs murmurs or gallops.  No Carotid bruits, radial pulse intact. No pedal edema.  Respiratory: Clear to auscultation bilaterally.  No wheezes, rales, or rhonchi.  No cyanosis, no use of accessory musculature Abdominal: No organomegaly, abdomen is soft and non-tender, positive bowel sounds. No masses. Skin: No rashes. Neurologic: Facial musculature symmetric. CN 2-12 grossly intact Psychiatric: Patient acts appropriately throughout our interaction. Lymphatic: No cervical or submandibular lymphadenopathy Musculoskeletal: Gait intact. No edema, tenderness, warmth,  neg Homans   LABS: Results for orders placed or performed in visit on 11/03/14  POCT CBC  Result Value Ref Range   WBC 8.5 4.6 - 10.2 K/uL   Lymph, poc 2.8 0.6 - 3.4   POC LYMPH PERCENT 32.4 10 - 50 %L   MID (cbc) 0.9 0 - 0.9   POC MID % 10.4 0 - 12 %M   POC Granulocyte 4.9 2 - 6.9   Granulocyte percent 57.2 37 - 80 %G   RBC 4.06 4.04 - 5.48 M/uL   Hemoglobin 11.8 (A) 12.2 - 16.2 g/dL   HCT, POC 37.5 (A) 37.7 -  47.9 %   MCV 92.5 80 - 97 fL   MCH, POC 29.1 27 - 31.2 pg   MCHC 31.4 (A) 31.8 -  35.4 g/dL   RDW, POC 13.4 %   Platelet Count, POC 354 142 - 424 K/uL   MPV 7.8 0 - 99.8 fL     EKG/XRAY:   Primary read interpreted by Dr. Marin Comment at [facility]. CHest xray  No acute cardiopulm process EKG SR    ASSESSMENT/PLAN: Encounter Diagnoses  Name Primary?  . Chest pain, unspecified chest pain type Yes  . Pain in joint, shoulder region, unspecified laterality   . Hypothyroidism, unspecified hypothyroidism type   . Esophageal stricture   . Calf pain, unspecified laterality   . Hiatal hernia   . Diverticulosis of intestine without bleeding, unspecified intestinal tract location    Margaret Popiel is a very well spoken and well put together 80 year old woman with a PMH that includes: atypical CP, asthma/COPD, DDD, somatization, diverticular disease, gastritis/esophageal strictures. who is here with multiple complaints. She has had intermittent diffuse CP that is primarily nonexertional along with joint pain in all major joints. She has had this since June 10 after her PNA vaccine which she thinks is the catalyst for all her sxs , has gotten worse in last 5 days. Did not want to go to Er because of cost. She had basic labs, Cxr and also EKG and PE which were all reassuring.  Labs pending IF she needs cardiology workup then will refer after labs are resulted. I will send her home VSS, all imaging and EKG reassuring.  Precautiosn given to go to Er prn  More than 30 min spent face to face time with patient.    Gross sideeffects, risk and benefits, and alternatives of medications d/w patient. Patient is aware that all medications have potential sideeffects and we are unable to predict every sideeffect or drug-drug interaction that may occur.  Thao Le DO  11/04/2014 1:21 PM

## 2014-11-04 LAB — TSH: TSH: 0.323 u[IU]/mL — ABNORMAL LOW (ref 0.350–4.500)

## 2014-11-04 LAB — COMPLETE METABOLIC PANEL WITH GFR
ALT: 11 U/L (ref 6–29)
Albumin: 4.2 g/dL (ref 3.6–5.1)
Alkaline Phosphatase: 52 U/L (ref 33–130)
CO2: 28 mmol/L (ref 20–31)
Calcium: 10.5 mg/dL — ABNORMAL HIGH (ref 8.6–10.4)
Creat: 0.56 mg/dL — ABNORMAL LOW (ref 0.60–0.93)
GFR, Est African American: >89 mL/min (ref >=60)
GFR, Est Non African American: >89 mL/min (ref >=60)
Sodium: 138 mmol/L (ref 135–146)
Total Bilirubin: 0.3 mg/dL (ref 0.2–1.2)
Total Protein: 7.1 g/dL (ref 6.1–8.1)

## 2014-11-04 LAB — COMPLETE METABOLIC PANEL WITHOUT GFR
AST: 19 U/L (ref 10–35)
BUN: 14 mg/dL (ref 7–25)
Chloride: 99 mmol/L (ref 98–110)
Glucose, Bld: 95 mg/dL (ref 65–99)
Potassium: 4.8 mmol/L (ref 3.5–5.3)

## 2014-11-06 ENCOUNTER — Telehealth: Payer: Self-pay

## 2014-11-06 NOTE — Telephone Encounter (Signed)
Spoke with patient about labs and xray, she is still feeling uterrible, she thinks it is from the  PNA vaccine in June. Nothing extraordianrily abnormal but low TSH so will add t3, T4. She was told about "osseus summation "on xray and minimally elevated 10.5 ( not on calcium supplements) , I think this can be rechecked later. She does not want cardiology referral for palpitations and chest pain, states work up has always been normal. Advise to fu with Dr Loanne Drilling or our office prn

## 2014-11-06 NOTE — Telephone Encounter (Signed)
Patient called requesting lab results. Please review labs.  Thank you.

## 2014-12-24 ENCOUNTER — Other Ambulatory Visit: Payer: Self-pay | Admitting: Endocrinology

## 2014-12-24 ENCOUNTER — Encounter: Payer: Self-pay | Admitting: Endocrinology

## 2014-12-24 ENCOUNTER — Ambulatory Visit (INDEPENDENT_AMBULATORY_CARE_PROVIDER_SITE_OTHER): Payer: Medicare Other | Admitting: Endocrinology

## 2014-12-24 VITALS — BP 133/80 | HR 87 | Temp 98.5°F | Ht 65.0 in | Wt 133.0 lb

## 2014-12-24 DIAGNOSIS — E039 Hypothyroidism, unspecified: Secondary | ICD-10-CM

## 2014-12-24 LAB — TSH: TSH: 0.58 u[IU]/mL (ref 0.35–4.50)

## 2014-12-24 MED ORDER — DULOXETINE HCL 30 MG PO CPEP: 30.0000 mg | ORAL_CAPSULE | Freq: Every day | ORAL | Status: DC

## 2014-12-24 NOTE — Progress Notes (Signed)
Subjective:    Patient ID: [name], female    DOB: 1934/03/17, 80 y.o.   MRN: [id number]  HPI Pt states few days of slight rash on the right foot, but no assoc itching.   Pt also reviews many chronic sxs.  Past Medical History  Diagnosis Date  . HYPOTHYROIDISM 09/21/2006  . HYPERCHOLESTEROLEMIA 06/05/2009  . ANEMIA-NOS 09/21/2006  . Somatization disorder 04/25/2007  . HYPERTENSION 09/21/2006  . ALLERGIC RHINITIS 09/21/2006  . ASTHMA 09/21/2006  . GERD 09/21/2006  . RENAL INSUFFICIENCY 09/21/2006  . UNSPECIFIED URINARY CALCULUS 05/28/2008  . CHEST PAIN, ATYPICAL 05/28/2008  . ABDOMINAL PAIN, CHRONIC 05/28/2008  . DIVERTICULITIS, HX OF 09/21/2006  . ASYMPTOMATIC POSTMENOPAUSAL STATUS 05/28/2008  . DDD (degenerative disc disease)   . Cataract   . COPD (chronic obstructive pulmonary disease) ([place])   . Small bowel obstruction ([place])   . Peptic ulcer disease     Past Surgical History  Procedure Laterality Date  . Nasal sinus surgery  1967  . Dental surgery  1970  . Appendectomy  1959  . Cholecystectomy  1983  . Abdominal hysterectomy  1981  . Pulmonary thrombosis    . Tonsillectomy    . Asthma & pneumonia birth  73's  . Acute nephritis  1958  . Tubal ligation    . Cesarean section    . Colon surgery      Social History   Social History  . Marital Status: Divorced    Spouse Name: N/A  . Number of Children: N/A  . Years of Education: N/A   Occupational History  . Not on file.   Social History Main Topics  . Smoking status: Never Smoker   . Smokeless tobacco: Never Used  . Alcohol Use: No  . Drug Use: No  . Sexual Activity: No   Other Topics Concern  . Not on file   Social History Narrative    Current Outpatient Prescriptions on File Prior to Visit  Medication Sig Dispense Refill  . cyanocobalamin 500 MCG tablet Take 500 mcg by mouth daily.    . Ferrous Sulfate (IRON) 325 (65 FE) MG TABS Take 1 tablet by mouth as needed.    Marland Kitchen levothyroxine (SYNTHROID,  LEVOTHROID) 75 MCG tablet Take 1 tablet (75 mcg total) by mouth daily before breakfast. 90 tablet 2  . magnesium 30 MG tablet Take 30 mg by mouth 2 (two) times daily.    . Multiple Vitamin (MULTIVITAMIN) tablet Take 1 tablet by mouth daily.       No current facility-administered medications on file prior to visit.    Allergies  Allergen Reactions  . Metronidazole Shortness Of Breath and Nausea And Vomiting  . Macrobid [Nitrofurantoin]     REACTION: Syncope  . Pepcid [Famotidine]     Family History  Problem Relation Age of Onset  . Cancer Mother   . Cancer Father     BP 133/80 mmHg  Pulse 87  Temp(Src) 98.5 F (36.9 C) (Oral)  Ht 5' 5"  (1.651 m)  Wt 133 lb (60.328 kg)  BMI 22.13 kg/m2  SpO2 98%  Review of Systems She has chronic pain throughout the body, and nasal congestion.    Objective:   Physical Exam VITAL SIGNS:  See vs page GENERAL: no distress head: no deformity eyes: no periorbital swelling, no proptosis external nose and ears are normal mouth: no lesion seen Both eac's and tm's are normal Skin: right foot, dorsal aspect: 2 cm area of eczema.  PSYCH:  appears depressed as she has in the past.     Lab Results  Component Value Date   TSH 0.58 12/24/2014   Lab Results  Component Value Date   WBC 8.5 11/03/2014   HGB 11.8* 11/03/2014   HCT 37.5* 11/03/2014   MCV 92.5 11/03/2014   PLT 311.0 07/18/2014      Assessment & Plan:  Hypothyroidism: well-replaced. Anemia: persistent, uncertain etiology Foot rash, chronic persistent. Chronic pain/ depression: cymbalta might help.    Patient is advised the following: Patient Instructions  Please continue the same flonase. Please continue the same skin cream for the rash on your foot (TAC). here are some tests for blood in the bowels.  please follow the instructions, and return to the lab. A thyroid blood test is requested for you today.  We'll let you know about the results.  i have sent a prescription  to your pharmacy, for a pill called "cymbalta," for the pain.

## 2014-12-24 NOTE — Patient Instructions (Addendum)
Please continue the same flonase. Please continue the same skin cream for the rash on your foot (TAC). here are some tests for blood in the bowels.  please follow the instructions, and return to the lab. A thyroid blood test is requested for you today.  We'll let you know about the results.  i have sent a prescription to your pharmacy, for a pill called "cymbalta," for the pain.

## 2014-12-30 DIAGNOSIS — L303 Infective dermatitis: Secondary | ICD-10-CM | POA: Diagnosis not present

## 2015-01-02 ENCOUNTER — Other Ambulatory Visit: Payer: Self-pay

## 2015-01-06 ENCOUNTER — Telehealth: Payer: Self-pay | Admitting: Endocrinology

## 2015-01-06 ENCOUNTER — Other Ambulatory Visit: Payer: Self-pay

## 2015-01-06 ENCOUNTER — Other Ambulatory Visit: Payer: Medicare Other

## 2015-01-06 DIAGNOSIS — D6489 Other specified anemias: Secondary | ICD-10-CM

## 2015-01-06 NOTE — Telephone Encounter (Signed)
The number listed is not a valid number.  Please advise on the correct number.

## 2015-01-06 NOTE — Telephone Encounter (Signed)
Orders have been placed.

## 2015-01-06 NOTE — Telephone Encounter (Signed)
Nana from [facility] need a add on for this patient. [phone]

## 2015-01-06 NOTE — Telephone Encounter (Signed)
[facility] needs stool IFOB order put in for the pt, future order, resulting agent is [place]

## 2015-01-07 LAB — HEMOCCULT SLIDES (X 3 CARDS)
Fecal Occult Blood: NEGATIVE
OCCULT 1: NEGATIVE
OCCULT 2: NEGATIVE
OCCULT 3: NEGATIVE
OCCULT 4: NEGATIVE
OCCULT 5: NEGATIVE

## 2015-01-12 ENCOUNTER — Ambulatory Visit (INDEPENDENT_AMBULATORY_CARE_PROVIDER_SITE_OTHER): Payer: Medicare Other | Admitting: Emergency Medicine

## 2015-01-12 VITALS — BP 128/78 | HR 93 | Temp 99.4°F | Resp 16 | Ht 65.0 in | Wt 134.2 lb

## 2015-01-12 DIAGNOSIS — J209 Acute bronchitis, unspecified: Secondary | ICD-10-CM

## 2015-01-12 DIAGNOSIS — J0140 Acute pansinusitis, unspecified: Secondary | ICD-10-CM | POA: Diagnosis not present

## 2015-01-12 DIAGNOSIS — R21 Rash and other nonspecific skin eruption: Secondary | ICD-10-CM | POA: Diagnosis not present

## 2015-01-12 DIAGNOSIS — J42 Unspecified chronic bronchitis: Secondary | ICD-10-CM

## 2015-01-12 MED ORDER — AMOXICILLIN-POT CLAVULANATE 875-125 MG PO TABS: 1.0000 | ORAL_TABLET | Freq: Two times a day (BID) | ORAL | Status: DC

## 2015-01-12 MED ORDER — HYDROCOD POLST-CPM POLST ER 10-8 MG/5ML PO SUER: 5.0000 mL | Freq: Two times a day (BID) | ORAL | Status: DC

## 2015-01-12 NOTE — Progress Notes (Signed)
Subjective:  Patient ID: [name], female    DOB: [birth year]  Age: 80 y.o. MRN: [id number]  CC: Cough; Sore Throat; Chills; and Sinusitis   HPI [name] presents she has fatigue over the last 3 or 4 days. She has nasal congestion and purulent nasal drainage. She has postnasal drip and pressure on her cheeks and forehead. She has a cough productive of mucopurulent sputum. Associated with wheezing and some exertional shortness of breath she denies any fever chills. No nausea vomiting. No stool change. She has no ill contacts or with over-the-counter medication History [name] has a past medical history of HYPOTHYROIDISM (09/21/2006); HYPERCHOLESTEROLEMIA (06/05/2009); ANEMIA-NOS (09/21/2006); Somatization disorder (04/25/2007); HYPERTENSION (09/21/2006); ALLERGIC RHINITIS (09/21/2006); ASTHMA (09/21/2006); GERD (09/21/2006); RENAL INSUFFICIENCY (09/21/2006); UNSPECIFIED URINARY CALCULUS (05/28/2008); CHEST PAIN, ATYPICAL (05/28/2008); ABDOMINAL PAIN, CHRONIC (05/28/2008); DIVERTICULITIS, HX OF (09/21/2006); ASYMPTOMATIC POSTMENOPAUSAL STATUS (05/28/2008); DDD (degenerative disc disease); Cataract; COPD (chronic obstructive pulmonary disease) ([place]); Small bowel obstruction ([place]); and Peptic ulcer disease.   She has past surgical history that includes Nasal sinus surgery (1696); Dental surgery (1970); Appendectomy (1959); Cholecystectomy (1983); Abdominal hysterectomy (1981); Pulmonary thrombosis; Tonsillectomy; Asthma & Pneumonia Birth [phone]); acute Nephritis (1958); Tubal ligation; Cesarean section; and Colon surgery.   Her  family history includes Cancer in her father and mother.  She   reports that she has never smoked. She has never used smokeless tobacco. She reports that she does not drink alcohol or use illicit drugs.  Outpatient Prescriptions Prior to Visit  Medication Sig Dispense Refill  . cyanocobalamin 500 MCG tablet Take 500 mcg by mouth daily.    . Ferrous Sulfate (IRON) 325 (65 FE) MG  TABS Take 1 tablet by mouth as needed.    Marland Kitchen FLOVENT HFA 110 MCG/ACT inhaler INHALE 2 PUFFS TWICE A DAY 12 Inhaler 4  . levothyroxine (SYNTHROID, LEVOTHROID) 75 MCG tablet Take 1 tablet (75 mcg total) by mouth daily before breakfast. 90 tablet 2  . magnesium 30 MG tablet Take 30 mg by mouth 2 (two) times daily.    . DULoxetine (CYMBALTA) 30 MG capsule Take 1 capsule (30 mg total) by mouth daily. (Patient not taking: Reported on 01/12/2015) 30 capsule 11  . Multiple Vitamin (MULTIVITAMIN) tablet Take 1 tablet by mouth daily.       No facility-administered medications prior to visit.    Social History   Social History  . Marital Status: Divorced    Spouse Name: N/A  . Number of Children: N/A  . Years of Education: N/A   Social History Main Topics  . Smoking status: Never Smoker   . Smokeless tobacco: Never Used  . Alcohol Use: No  . Drug Use: No  . Sexual Activity: No   Other Topics Concern  . None   Social History Narrative     Review of Systems  Constitutional: Positive for fatigue. Negative for fever, chills and appetite change.  HENT: Positive for congestion, postnasal drip, rhinorrhea and sinus pressure. Negative for ear pain and sore throat.   Eyes: Negative for pain and redness.  Respiratory: Positive for cough, shortness of breath and wheezing.   Cardiovascular: Negative for leg swelling.  Gastrointestinal: Negative for nausea, vomiting, abdominal pain, diarrhea, constipation and blood in stool.  Endocrine: Negative for polyuria.  Genitourinary: Negative for dysuria, urgency, frequency and flank pain.  Musculoskeletal: Negative for gait problem.  Skin: Negative for rash.  Neurological: Negative for weakness and headaches.  Psychiatric/Behavioral: Negative for confusion and decreased concentration. The patient is not  nervous/anxious.     Objective:  BP 128/78 mmHg  Pulse 93  Temp(Src) 99.4 F (37.4 C) (Oral)  Resp 16  Ht 5' 5"  (1.651 m)  Wt 134 lb 3.2 oz  (60.873 kg)  BMI 22.33 kg/m2  SpO2 98%  Physical Exam  Constitutional: She is oriented to person, place, and time. She appears well-developed and well-nourished. No distress.  HENT:  Head: Normocephalic and atraumatic.  Right Ear: External ear normal.  Left Ear: External ear normal.  Nose: Nose normal.  Eyes: Conjunctivae and EOM are normal. Pupils are equal, round, and reactive to light. No scleral icterus.  Neck: Normal range of motion. Neck supple. No tracheal deviation present.  Cardiovascular: Normal rate, regular rhythm and normal heart sounds.   Pulmonary/Chest: Effort normal. No respiratory distress. She has no wheezes. She has no rales.  Abdominal: She exhibits no mass. There is no tenderness. There is no rebound and no guarding.  Musculoskeletal: She exhibits no edema.  Lymphadenopathy:    She has no cervical adenopathy.  Neurological: She is alert and oriented to person, place, and time. Coordination normal.  Skin: Skin is warm and dry. No rash noted.  Psychiatric: She has a normal mood and affect. Her behavior is normal.      Assessment & Plan:   [name] was seen today for cough, sore throat, chills and sinusitis.  Diagnoses and all orders for this visit:  Acute exacerbation of chronic bronchitis ([place])  Acute pansinusitis, recurrence not specified  Rash and nonspecific skin eruption -     Ambulatory referral to Dermatology  Other orders -     amoxicillin-clavulanate (AUGMENTIN) 875-125 MG tablet; Take 1 tablet by mouth 2 (two) times daily. -     chlorpheniramine-HYDROcodone (TUSSIONEX PENNKINETIC ER) 10-8 MG/5ML SUER; Take 5 mLs by mouth 2 (two) times daily.  I am having [name] start on amoxicillin-clavulanate and chlorpheniramine-HYDROcodone. I am also having her maintain her multivitamin, Iron, cyanocobalamin, magnesium, levothyroxine, DULoxetine, and FLOVENT HFA.  Meds ordered this encounter  Medications  . amoxicillin-clavulanate (AUGMENTIN) 875-125 MG  tablet    Sig: Take 1 tablet by mouth 2 (two) times daily.    Dispense:  20 tablet    Refill:  0  . chlorpheniramine-HYDROcodone (TUSSIONEX PENNKINETIC ER) 10-8 MG/5ML SUER    Sig: Take 5 mLs by mouth 2 (two) times daily.    Dispense:  60 mL    Refill:  0    Appropriate red flag conditions were discussed with the patient as well as actions that should be taken.  Patient expressed his understanding.  Follow-up: Return if symptoms worsen or fail to improve.  Roselee Culver, MD

## 2015-01-12 NOTE — Patient Instructions (Signed)
Chronic Bronchitis Chronic bronchitis is a lasting inflammation of the bronchial tubes, which are the tubes that carry air into your lungs. This is inflammation that occurs:   On most days of the week.   For at least three months at a time.   Over a period of two years in a row. When the bronchial tubes are inflamed, they start to produce mucus. The inflammation and buildup of mucus make it more difficult to breathe. Chronic bronchitis is usually a permanent problem and is one type of chronic obstructive pulmonary disease (COPD). People with chronic bronchitis are at greater risk for getting repeated colds, or respiratory infections. CAUSES  Chronic bronchitis most often occurs in people who have:  Long-standing, severe asthma.  A history of smoking.  Asthma and who also smoke. SIGNS AND SYMPTOMS  Chronic bronchitis may cause the following:   A cough that brings up mucus (productive cough).  Shortness of breath.  Early morning headache.  Wheezing.  Chest discomfort.   Recurring respiratory infections. DIAGNOSIS  Your health care provider may confirm the diagnosis by:  Taking your medical history.  Performing a physical exam.  Taking a chest X-ray.   Performing pulmonary function tests. TREATMENT  Treatment involves controlling symptoms with medicines, oxygen therapy, or making lifestyle changes, such as exercising and eating a healthy, well-balanced diet. Medicines could include:  Inhalers to improve air flow in and out of your lungs.  Antibiotics to treat bacterial infections, such as pneumonia, sinus infections, and acute bronchitis. As a preventative measure, your health care provider may recommend routine vaccinations for influenza and pneumonia. This is to prevent infection and hospitalization since you may be more at risk for these types of infections.  HOME CARE INSTRUCTIONS  Take medicines only as directed by your health care provider.   If you smoke  cigarettes, chew tobacco, or use electronic cigarettes, quit. If you need help quitting, ask your health care provider.  Avoid pollen, dust, animal dander, molds, smoke, and other things that cause shortness of breath or wheezing attacks.  Talk to your health care provider about possible exercise routines. Regular exercise is very important to help you feel better.  If you are prescribed oxygen use at home follow these guidelines:  Never smoke while using oxygen. Oxygen does not burn or explode, but flammable materials will burn faster in the presence of oxygen.  Keep a fire extinguisher close by. Let your fire department know that you have oxygen in your home.  Warn visitors not to smoke near you when you are using oxygen. Put up "no smoking" signs in your home where you most often use the oxygen.  Regularly test your smoke detectors at home to make sure they work. If you receive care in your home from a nurse or other health care provider, he or she may also check to make sure your smoke detectors work.  Ask your health care provider whether you would benefit from a pulmonary rehabilitation program.  Do not wait to get medical care if you have any concerning symptoms. Delays could cause permanent injury and may be life threatening. SEEK MEDICAL CARE IF:  You have increased coughing or shortness of breath or both.  You have muscle aches.  You have chest pain.  Your mucus gets thicker.  Your mucus changes from clear or white to yellow, green, gray, or bloody. SEEK IMMEDIATE MEDICAL CARE IF:  Your usual medicines do not stop your wheezing.   You have increased difficulty breathing.     You have any problems with the medicine you are taking, such as a rash, itching, swelling, or trouble breathing. MAKE SURE YOU:   Understand these instructions.  Will watch your condition.  Will get help right away if you are not doing well or get worse.   This information is not intended to  replace advice given to you by your health care provider. Make sure you discuss any questions you have with your health care provider.   Document Released: 11/11/2005 Document Revised: 02/14/2014 Document Reviewed: 03/04/2013 Elsevier Interactive Patient Education 2016 Elsevier Inc.  

## 2015-01-16 ENCOUNTER — Ambulatory Visit: Payer: Self-pay | Admitting: Endocrinology

## 2015-01-21 DIAGNOSIS — L309 Dermatitis, unspecified: Secondary | ICD-10-CM | POA: Diagnosis not present

## 2015-01-21 DIAGNOSIS — I83891 Varicose veins of right lower extremities with other complications: Secondary | ICD-10-CM | POA: Diagnosis not present

## 2015-01-21 DIAGNOSIS — L820 Inflamed seborrheic keratosis: Secondary | ICD-10-CM | POA: Diagnosis not present

## 2015-01-21 DIAGNOSIS — L821 Other seborrheic keratosis: Secondary | ICD-10-CM | POA: Diagnosis not present

## 2015-01-21 DIAGNOSIS — I8390 Asymptomatic varicose veins of unspecified lower extremity: Secondary | ICD-10-CM | POA: Diagnosis not present

## 2015-02-24 DIAGNOSIS — H04123 Dry eye syndrome of bilateral lacrimal glands: Secondary | ICD-10-CM | POA: Diagnosis not present

## 2015-02-24 DIAGNOSIS — H10413 Chronic giant papillary conjunctivitis, bilateral: Secondary | ICD-10-CM | POA: Diagnosis not present

## 2015-02-24 DIAGNOSIS — H26492 Other secondary cataract, left eye: Secondary | ICD-10-CM | POA: Diagnosis not present

## 2015-02-24 DIAGNOSIS — Z961 Presence of intraocular lens: Secondary | ICD-10-CM | POA: Diagnosis not present

## 2015-03-04 DIAGNOSIS — L821 Other seborrheic keratosis: Secondary | ICD-10-CM | POA: Diagnosis not present

## 2015-03-04 DIAGNOSIS — D485 Neoplasm of uncertain behavior of skin: Secondary | ICD-10-CM | POA: Diagnosis not present

## 2015-03-04 DIAGNOSIS — L820 Inflamed seborrheic keratosis: Secondary | ICD-10-CM | POA: Diagnosis not present

## 2015-03-04 DIAGNOSIS — L570 Actinic keratosis: Secondary | ICD-10-CM | POA: Diagnosis not present

## 2015-04-29 DIAGNOSIS — L570 Actinic keratosis: Secondary | ICD-10-CM | POA: Diagnosis not present

## 2015-05-07 ENCOUNTER — Telehealth: Payer: Self-pay

## 2015-05-07 NOTE — Telephone Encounter (Signed)
Spoke with patient, she states that she declines the flu shot this year, she had the pneumonia vaccination and it made her extremely sick and she has been ill every since, states she needs to schedule a follow up with Dr Loanne Drilling and she will talk to him about it at that time.

## 2015-06-03 ENCOUNTER — Other Ambulatory Visit: Payer: Self-pay

## 2015-06-03 DIAGNOSIS — Z1231 Encounter for screening mammogram for malignant neoplasm of breast: Secondary | ICD-10-CM

## 2015-07-14 ENCOUNTER — Ambulatory Visit
Admission: RE | Admit: 2015-07-14 | Discharge: 2015-07-14 | Disposition: A | Discharge status: Home / Self Care | Payer: Medicare Other | Source: Ambulatory Visit

## 2015-07-14 DIAGNOSIS — Z1231 Encounter for screening mammogram for malignant neoplasm of breast: Secondary | ICD-10-CM | POA: Diagnosis not present

## 2015-07-16 ENCOUNTER — Other Ambulatory Visit: Payer: Self-pay | Admitting: Endocrinology

## 2015-07-16 DIAGNOSIS — R928 Other abnormal and inconclusive findings on diagnostic imaging of breast: Secondary | ICD-10-CM

## 2015-07-29 ENCOUNTER — Other Ambulatory Visit: Payer: Self-pay | Admitting: Endocrinology

## 2015-07-29 ENCOUNTER — Ambulatory Visit
Admission: RE | Admit: 2015-07-29 | Discharge: 2015-07-29 | Disposition: A | Discharge status: Home / Self Care | Payer: Medicare Other | Source: Ambulatory Visit | Attending: Endocrinology | Admitting: Endocrinology

## 2015-07-29 DIAGNOSIS — R928 Other abnormal and inconclusive findings on diagnostic imaging of breast: Secondary | ICD-10-CM

## 2015-07-29 DIAGNOSIS — R921 Mammographic calcification found on diagnostic imaging of breast: Secondary | ICD-10-CM | POA: Diagnosis not present

## 2015-08-03 ENCOUNTER — Ambulatory Visit
Admission: RE | Admit: 2015-08-03 | Discharge: 2015-08-03 | Disposition: A | Discharge status: Home / Self Care | Payer: Medicare Other | Source: Ambulatory Visit | Attending: Endocrinology | Admitting: Endocrinology

## 2015-08-03 ENCOUNTER — Other Ambulatory Visit: Payer: Self-pay | Admitting: Endocrinology

## 2015-08-03 DIAGNOSIS — R928 Other abnormal and inconclusive findings on diagnostic imaging of breast: Secondary | ICD-10-CM

## 2015-08-03 DIAGNOSIS — D241 Benign neoplasm of right breast: Secondary | ICD-10-CM | POA: Diagnosis not present

## 2015-08-03 DIAGNOSIS — R921 Mammographic calcification found on diagnostic imaging of breast: Secondary | ICD-10-CM | POA: Diagnosis not present

## 2015-08-14 ENCOUNTER — Ambulatory Visit
Admission: RE | Admit: 2015-08-14 | Discharge: 2015-08-14 | Disposition: A | Discharge status: Home / Self Care | Payer: Medicare Other | Source: Ambulatory Visit | Attending: Endocrinology | Admitting: Endocrinology

## 2015-08-14 ENCOUNTER — Ambulatory Visit (INDEPENDENT_AMBULATORY_CARE_PROVIDER_SITE_OTHER): Payer: Medicare Other | Admitting: Endocrinology

## 2015-08-14 ENCOUNTER — Encounter: Payer: Self-pay | Admitting: Endocrinology

## 2015-08-14 VITALS — BP 132/80 | HR 90 | Ht 65.0 in | Wt 133.0 lb

## 2015-08-14 DIAGNOSIS — E039 Hypothyroidism, unspecified: Secondary | ICD-10-CM | POA: Diagnosis not present

## 2015-08-14 DIAGNOSIS — R918 Other nonspecific abnormal finding of lung field: Secondary | ICD-10-CM

## 2015-08-14 DIAGNOSIS — R079 Chest pain, unspecified: Secondary | ICD-10-CM | POA: Diagnosis not present

## 2015-08-14 DIAGNOSIS — D6489 Other specified anemias: Secondary | ICD-10-CM | POA: Diagnosis not present

## 2015-08-14 DIAGNOSIS — E7800 Pure hypercholesterolemia, unspecified: Secondary | ICD-10-CM | POA: Diagnosis not present

## 2015-08-14 DIAGNOSIS — D509 Iron deficiency anemia, unspecified: Secondary | ICD-10-CM | POA: Diagnosis not present

## 2015-08-14 DIAGNOSIS — N259 Disorder resulting from impaired renal tubular function, unspecified: Secondary | ICD-10-CM | POA: Diagnosis not present

## 2015-08-14 DIAGNOSIS — R0602 Shortness of breath: Secondary | ICD-10-CM | POA: Diagnosis not present

## 2015-08-14 LAB — URINALYSIS, ROUTINE W REFLEX MICROSCOPIC
BILIRUBIN URINE: NEGATIVE
HGB URINE DIPSTICK: NEGATIVE
Ketones, ur: NEGATIVE
Nitrite: NEGATIVE
Specific Gravity, Urine: 1.01 (ref 1.000–1.030)
TOTAL PROTEIN, URINE-UPE24: NEGATIVE
URINE GLUCOSE: NEGATIVE
UROBILINOGEN UA: 0.2 (ref 0.0–1.0)
pH: 5.5 (ref 5.0–8.0)

## 2015-08-14 LAB — CBC WITH DIFFERENTIAL/PLATELET
Basophils Relative: 1 % (ref 0.0–3.0)
EOS PCT: 9.8 % — AB (ref 0.0–5.0)
HEMATOCRIT: 35.7 % — AB (ref 36.0–46.0)
HEMOGLOBIN: 12 g/dL (ref 12.0–15.0)
Lymphocytes Relative: 29.1 % (ref 12.0–46.0)
MCHC: 33.6 g/dL (ref 30.0–36.0)
MCV: 93.1 fl (ref 78.0–100.0)
Monocytes Relative: 14.2 % — ABNORMAL HIGH (ref 3.0–12.0)
Neutrophils Relative %: 45.9 % (ref 43.0–77.0)
Platelets: 314 10*3/uL (ref 150.0–400.0)
RBC: 3.83 Mil/uL — ABNORMAL LOW (ref 3.87–5.11)
RDW: 13.7 % (ref 11.5–15.5)
WBC: 6.8 10*3/uL (ref 4.0–10.5)

## 2015-08-14 LAB — BASIC METABOLIC PANEL
BUN: 19 mg/dL (ref 6–23)
CHLORIDE: 100 meq/L (ref 96–112)
CO2: 31 meq/L (ref 19–32)
Calcium: 10.1 mg/dL (ref 8.4–10.5)
Creatinine, Ser: 0.64 mg/dL (ref 0.40–1.20)
GFR: 95.06 mL/min (ref >60.00)
Glucose, Bld: 99 mg/dL (ref 70–99)
Potassium: 4.4 mEq/L (ref 3.5–5.1)
SODIUM: 134 meq/L — AB (ref 135–145)

## 2015-08-14 LAB — LIPID PANEL
Cholesterol: 183 mg/dL (ref 0–200)
HDL: 58.7 mg/dL (ref >39.00)
LDL Cholesterol: 103 mg/dL — ABNORMAL HIGH (ref 0–99)
NONHDL: 124.1
Total CHOL/HDL Ratio: 3
Triglycerides: 106 mg/dL (ref 0.0–149.0)
VLDL: 21.2 mg/dL (ref 0.0–40.0)

## 2015-08-14 LAB — IBC PANEL
Iron: 80 ug/dL (ref 42–145)
Saturation Ratios: 27.2 % (ref 20.0–50.0)
Transferrin: 210 mg/dL — ABNORMAL LOW (ref 212.0–360.0)

## 2015-08-14 LAB — HEPATIC FUNCTION PANEL
ALBUMIN: 4.1 g/dL (ref 3.5–5.2)
ALK PHOS: 48 U/L (ref 39–117)
ALT: 12 U/L (ref 0–35)
AST: 21 U/L (ref 0–37)
Bilirubin, Direct: 0.1 mg/dL (ref 0.0–0.3)
TOTAL PROTEIN: 7.1 g/dL (ref 6.0–8.3)
Total Bilirubin: 0.3 mg/dL (ref 0.2–1.2)

## 2015-08-14 LAB — TSH: TSH: 0.54 u[IU]/mL (ref 0.35–4.50)

## 2015-08-14 MED ORDER — LEVOTHYROXINE SODIUM 75 MCG PO TABS: 75.0000 ug | ORAL_TABLET | Freq: Every day | ORAL | Status: DC

## 2015-08-14 MED ORDER — FLUTICASONE PROPIONATE HFA 110 MCG/ACT IN AERO: 2.0000 | INHALATION_SPRAY | Freq: Two times a day (BID) | RESPIRATORY_TRACT | Status: DC

## 2015-08-14 NOTE — Progress Notes (Signed)
we discussed code status.  pt requests DNR

## 2015-08-14 NOTE — Progress Notes (Signed)
Subjective:    Patient ID: [name], female    DOB: [birth year], 81 y.o.   MRN: [id number]  HPI Pt is here for regular wellness examination, and is feeling pretty well in general, and says chronic med probs are stable, except as noted below Past Medical History  Diagnosis Date  . HYPOTHYROIDISM 09/21/2006  . HYPERCHOLESTEROLEMIA 06/05/2009  . ANEMIA-NOS 09/21/2006  . Somatization disorder 04/25/2007  . HYPERTENSION 09/21/2006  . ALLERGIC RHINITIS 09/21/2006  . ASTHMA 09/21/2006  . GERD 09/21/2006  . RENAL INSUFFICIENCY 09/21/2006  . UNSPECIFIED URINARY CALCULUS 05/28/2008  . CHEST PAIN, ATYPICAL 05/28/2008  . ABDOMINAL PAIN, CHRONIC 05/28/2008  . DIVERTICULITIS, HX OF 09/21/2006  . ASYMPTOMATIC POSTMENOPAUSAL STATUS 05/28/2008  . DDD (degenerative disc disease)   . Cataract   . COPD (chronic obstructive pulmonary disease) ([place])   . Small bowel obstruction ([place])   . Peptic ulcer disease     Past Surgical History  Procedure Laterality Date  . Nasal sinus surgery  1967  . Dental surgery  1970  . Appendectomy  1959  . Cholecystectomy  1983  . Abdominal hysterectomy  1981  . Pulmonary thrombosis    . Tonsillectomy    . Asthma & pneumonia birth  19's  . Acute nephritis  1958  . Tubal ligation    . Cesarean section    . Colon surgery      Social History   Social History  . Marital Status: Divorced    Spouse Name: N/A  . Number of Children: N/A  . Years of Education: N/A   Occupational History  . Not on file.   Social History Main Topics  . Smoking status: Never Smoker   . Smokeless tobacco: Never Used  . Alcohol Use: No  . Drug Use: No  . Sexual Activity: No   Other Topics Concern  . Not on file   Social History Narrative    Current Outpatient Prescriptions on File Prior to Visit  Medication Sig Dispense Refill  . cyanocobalamin 500 MCG tablet Take 500 mcg by mouth daily.    . Ferrous Sulfate (IRON) 325 (65 FE) MG TABS Take 1 tablet by mouth as needed.    .  magnesium 30 MG tablet Take 30 mg by mouth 2 (two) times daily.    . Multiple Vitamin (MULTIVITAMIN) tablet Take 1 tablet by mouth daily.       No current facility-administered medications on file prior to visit.    Allergies  Allergen Reactions  . Metronidazole Shortness Of Breath and Nausea And Vomiting  . Macrobid [Nitrofurantoin]     REACTION: Syncope  . Pepcid [Famotidine]     Family History  Problem Relation Age of Onset  . Cancer Mother   . Cancer Father     BP 132/80 mmHg  Pulse 90  Ht 5' 5"  (1.651 m)  Wt 133 lb (60.328 kg)  BMI 22.13 kg/m2  SpO2 96%  Review of Systems  Constitutional: Positive for fatigue. Negative for fever and unexpected weight change.  HENT: Negative for nosebleeds.   Eyes: Negative for photophobia.  Respiratory: Negative for shortness of breath.   Cardiovascular: Negative for chest pain.  Gastrointestinal: Negative for blood in stool.  Endocrine: Positive for cold intolerance.  Genitourinary: Negative for hematuria.  Musculoskeletal: Positive for arthralgias.  Skin: Negative for wound.  Allergic/Immunologic: Positive for environmental allergies.  Neurological: Negative for syncope.  Hematological: Does not bruise/bleed easily.  Psychiatric/Behavioral: Negative for suicidal ideas.  Objective:   Physical Exam VS: see vs page GEN: no distress HEAD: head: no deformity eyes: no periorbital swelling, no proptosis external nose and ears are normal mouth: no lesion seen NECK: supple, thyroid is not enlarged CHEST WALL: no deformity LUNGS: clear to auscultation CV: reg rate and rhythm, no murmur ABD: abdomen is soft, nontender.  no hepatosplenomegaly.  not distended.  no hernia MUSCULOSKELETAL: muscle bulk and strength are grossly normal.  no obvious joint swelling.  gait is normal and steady EXTEMITIES: no deformity, but toe separators are noted.  no ulcer on the feet.  feet are of normal color and temp.  no edema.  PULSES:  dorsalis pedis intact bilat.  no carotid bruit.   NEURO:  cn 2-12 grossly intact.   readily moves all 4's.  sensation is intact to touch on the feet SKIN:  Normal texture and temperature.  No rash or suspicious lesion is visible.  Right breast bx site is healing well.  NODES:  None palpable at the neck PSYCH: alert, well-oriented.  Does not appear anxious nor depressed.     Assessment & Plan:  Wellness visit today, with problems stable, except as noted.   Subjective:   Patient here for Medicare annual wellness visit and management of other chronic and acute problems.     Risk factors: advanced age    16 of Physicians Providing Medical Care to Patient:  See "snapshot"   Activities of Daily Living: In your present state of health, do you have any difficulty performing the following activities (lives alone)?:  Preparing food and eating?: No  Bathing yourself: No  Getting dressed: No  Using the toilet:No  Moving around from place to place: No  In the past year have you fallen or had a near fall?: No    Home Safety: Has smoke detector and wears seat belts. No firearms. No excess sun exposure.   Diet and Exercise  Current exercise habits: pt says good Dietary issues discussed: pt reports a healthy diet   Depression Screen  Q1: Over the past two weeks, have you felt down, depressed or hopeless?no  Q2: Over the past two weeks, have you felt little interest or pleasure in doing things? no   The following portions of the patient's history were reviewed and updated as appropriate: allergies, current medications, past family history, past medical history, past social history, past surgical history and problem list.   Review of Systems  Denies hearing loss, and visual loss.  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  Cognitive  Impairment Assessment: cognition, memory and judgment appear normal.   remembers 2/3 at 5 minutes.  excellent recall.  can easily read and write a sentence.  alert and oriented x 3.    Assessment:   Medicare wellness utd on preventive parameters    Plan:   During the course of the visit the patient was educated and counseled about appropriate screening and preventive services including:        Fall prevention   Diabetes screening is done  Nutrition counseling   Vaccines: prevnar is declined  Patient Instructions (the written plan) was given to the patient.

## 2015-08-14 NOTE — Patient Instructions (Addendum)
blood tests and a chest x-ray are requested for you today.  We'll let you know about the results.   Please consider these measures for your health:  minimize alcohol.  Do not use tobacco products.  Have a colonoscopy at least every 10 years from age 79.  Women should have an annual mammogram from age 79.  Keep firearms safely stored.  Always use seat belts.  have working smoke alarms in your home.  See an eye doctor and dentist regularly.  Never drive under the influence of alcohol or drugs (including prescription drugs).  Those with fair skin should take precautions against the sun, and should carefully examine their skin once per month, for any new or changed moles. good diet and exercise significantly improve your health.  please let me know if you wish to be referred to a dietician.  high blood sugar is very risky to your health.  you should see an eye doctor and dentist every year.  It is very important to get all recommended vaccinations.   It is critically important to prevent falling down (keep floor areas well-lit, dry, and free of loose objects.  If you have a cane, walker, or wheelchair, you should use it, even for short trips around the house.  Wear flat-soled shoes.  Also, try not to rush).   Please return in 1 year.

## 2015-08-17 ENCOUNTER — Telehealth: Payer: Self-pay

## 2015-08-17 NOTE — Telephone Encounter (Signed)
Called patient to notify of normal results, no answer; left voicemail to give call back.

## 2015-09-13 ENCOUNTER — Other Ambulatory Visit: Payer: Self-pay | Admitting: Endocrinology

## 2015-09-14 ENCOUNTER — Encounter: Payer: Self-pay | Admitting: Urgent Care

## 2015-09-14 ENCOUNTER — Ambulatory Visit (INDEPENDENT_AMBULATORY_CARE_PROVIDER_SITE_OTHER): Payer: Medicare Other | Admitting: Urgent Care

## 2015-09-14 VITALS — BP 136/60 | HR 77 | Temp 98.7°F | Resp 16 | Ht 65.0 in | Wt 135.6 lb

## 2015-09-14 DIAGNOSIS — R300 Dysuria: Secondary | ICD-10-CM

## 2015-09-14 DIAGNOSIS — R5381 Other malaise: Secondary | ICD-10-CM | POA: Diagnosis not present

## 2015-09-14 DIAGNOSIS — R6883 Chills (without fever): Secondary | ICD-10-CM | POA: Diagnosis not present

## 2015-09-14 DIAGNOSIS — R0602 Shortness of breath: Secondary | ICD-10-CM | POA: Diagnosis not present

## 2015-09-14 DIAGNOSIS — R0789 Other chest pain: Secondary | ICD-10-CM

## 2015-09-14 LAB — POCT CBC
GRANULOCYTE PERCENT: 55 % (ref 37–80)
HEMATOCRIT: 34.5 % — AB (ref 37.7–47.9)
Hemoglobin: 12.1 g/dL — AB (ref 12.2–16.2)
Lymph, poc: 2.4 (ref 0.6–3.4)
MCH, POC: 32.3 pg — AB (ref 27–31.2)
MCHC: 35.1 g/dL (ref 31.8–35.4)
MCV: 91.8 fL (ref 80–97)
MID (CBC): 0.8 (ref 0–0.9)
MPV: 7.7 fL (ref 0–99.8)
PLATELET COUNT, POC: 277 10*3/uL (ref 142–424)
POC GRANULOCYTE: 3.9 (ref 2–6.9)
POC LYMPH %: 33.3 % (ref 10–50)
POC MID %: 11.7 %M (ref 0–12)
RBC: 3.76 M/uL — AB (ref 4.04–5.48)
RDW, POC: 13.2 %
WBC: 7.1 10*3/uL (ref 4.6–10.2)

## 2015-09-14 LAB — POC MICROSCOPIC URINALYSIS (UMFC): Mucus: ABSENT

## 2015-09-14 LAB — POCT URINALYSIS DIP (MANUAL ENTRY)
Bilirubin, UA: NEGATIVE
Blood, UA: NEGATIVE
Glucose, UA: NEGATIVE
Ketones, POC UA: NEGATIVE
NITRITE UA: NEGATIVE
PH UA: 8.5
PROTEIN UA: NEGATIVE
Spec Grav, UA: 1.015
UROBILINOGEN UA: 0.2

## 2015-09-14 MED ORDER — CEPHALEXIN 500 MG PO CAPS: 500.0000 mg | ORAL_CAPSULE | Freq: Two times a day (BID) | ORAL | 0 refills | Status: DC

## 2015-09-14 NOTE — Patient Instructions (Addendum)
Dysuria Dysuria is pain or discomfort while urinating. The pain or discomfort may be felt in the tube that carries urine out of the bladder (urethra) or in the surrounding tissue of the genitals. The pain may also be felt in the groin area, lower abdomen, and lower back. You may have to urinate frequently or have the sudden feeling that you have to urinate (urgency). Dysuria can affect both men and women, but is more common in women. Dysuria can be caused by many different things, including:  Urinary tract infection in women.  Infection of the kidney or bladder.  Kidney stones or bladder stones.  Certain sexually transmitted infections (STIs), such as chlamydia.  Dehydration.  Inflammation of the vagina.  Use of certain medicines.  Use of certain soaps or scented products that cause irritation. HOME CARE INSTRUCTIONS Watch your dysuria for any changes. The following actions may help to reduce any discomfort you are feeling:  Drink enough fluid to keep your urine clear or pale yellow.  Empty your bladder often. Avoid holding urine for long periods of time.  After a bowel movement or urination, women should cleanse from front to back, using each tissue only once.  Empty your bladder after sexual intercourse.  Take medicines only as directed by your health care provider.  If you were prescribed an antibiotic medicine, finish it all even if you start to feel better.  Avoid caffeine, tea, and alcohol. They can irritate the bladder and make dysuria worse. In men, alcohol may irritate the prostate.  Keep all follow-up visits as directed by your health care provider. This is important.  If you had any tests done to find the cause of dysuria, it is your responsibility to obtain your test results. Ask the lab or department performing the test when and how you will get your results. Talk with your health care provider if you have any questions about your results. SEEK MEDICAL CARE  IF:  You develop pain in your back or sides.  You have a fever.  You have nausea or vomiting.  You have blood in your urine.  You are not urinating as often as you usually do. SEEK IMMEDIATE MEDICAL CARE IF:  You pain is severe and not relieved with medicines.  You are unable to hold down any fluids.  You or someone else notices a change in your mental function.  You have a rapid heartbeat at rest.  You have shaking or chills.  You feel extremely weak.   This information is not intended to replace advice given to you by your health care provider. Make sure you discuss any questions you have with your health care provider.   Document Released: 10/23/2003 Document Revised: 02/14/2014 Document Reviewed: 09/19/2013 Elsevier Interactive Patient Education 2016 [employer].     IF you received an x-ray today, you will receive an invoice from [facility]. Please contact [facility] at [phone] with questions or concerns regarding your invoice.   IF you received labwork today, you will receive an invoice from [employer]. Please contact [facility] at [phone] with questions or concerns regarding your invoice.   Our billing staff will not be able to assist you with questions regarding bills from these companies.  You will be contacted with the lab results as soon as they are available. The fastest way to get your results is to activate your My Chart account. Instructions are located on the last page of this paperwork. If you have not heard from  [place] regarding the results in 2 weeks, please contact this office.

## 2015-09-14 NOTE — Progress Notes (Signed)
MRN: [id number] DOB: 1934-11-17  Subjective:   [name] is a 81 y.o. female with pmh of COPD presenting for chief complaint of Other (dizzy); Other (chest pressure); and Other (sob)  Reports 1 week history of shob, chills, subjective fever, chest congestion, chest discomfort, dysuria. Patient does have a dry cough, this has been longstanding and uses Flovent daily but not her rescue inhaler. Denies n/v, wheezing, rashes, lower leg swelling, heart racing, palpitations, diaphoresis. She drinks ~60 ounces of water daily. Denies smoking cigarettes.  Lakara has a current medication list which includes the following prescription(s): cyanocobalamin, iron, fluticasone, levothyroxine, magnesium, and multivitamin. Also is allergic to metronidazole; macrobid [nitrofurantoin]; and pepcid [famotidine].  Tirsa  has a past medical history of ABDOMINAL PAIN, CHRONIC (05/28/2008); ALLERGIC RHINITIS (09/21/2006); ANEMIA-NOS (09/21/2006); ASTHMA (09/21/2006); ASYMPTOMATIC POSTMENOPAUSAL STATUS (05/28/2008); Cataract; CHEST PAIN, ATYPICAL (05/28/2008); COPD (chronic obstructive pulmonary disease) ([place]); DDD (degenerative disc disease); DIVERTICULITIS, HX OF (09/21/2006); GERD (09/21/2006); HYPERCHOLESTEROLEMIA (06/05/2009); HYPERTENSION (09/21/2006); HYPOTHYROIDISM (09/21/2006); Peptic ulcer disease; RENAL INSUFFICIENCY (09/21/2006); Small bowel obstruction ([place]); Somatization disorder (04/25/2007); and UNSPECIFIED URINARY CALCULUS (05/28/2008). Also  has a past surgical history that includes Nasal sinus surgery (5498); Dental surgery (1970); Appendectomy (1959); Cholecystectomy (1983); Abdominal hysterectomy (1981); Pulmonary thrombosis; Tonsillectomy; Asthma & Pneumonia Birth [id number]); acute Nephritis (1958); Tubal ligation; Cesarean section; and Colon surgery.  Objective:   Vitals: BP 136/60 (BP Location: Right Arm, Patient Position: Sitting, Cuff Size: Normal)   Pulse 77   Temp 98.7 F (37.1 C) (Axillary)   Resp 16   Ht  5' 5"  (1.651 m)   Wt 135 lb 9.6 oz (61.5 kg)   SpO2 95%   BMI 22.57 kg/m   BP Readings from Last 3 Encounters:  09/14/15 136/60  08/14/15 132/80  01/12/15 128/78    Wt Readings from Last 3 Encounters:  09/14/15 135 lb 9.6 oz (61.5 kg)  08/14/15 133 lb (60.3 kg)  01/12/15 134 lb 3.2 oz (60.9 kg)    Physical Exam  Constitutional: She is oriented to person, place, and time. She appears well-developed and well-nourished.  HENT:  Mouth/Throat: Oropharynx is clear and moist.  Cardiovascular: Normal rate, regular rhythm and intact distal pulses.  Exam reveals no gallop and no friction rub.   No murmur heard. Pulmonary/Chest: No respiratory distress. She has no wheezes. She has no rales.  Abdominal: Soft. Bowel sounds are normal. She exhibits no distension and no mass. There is tenderness (generalized throughout, bilateral CVA tenderness).  Musculoskeletal: She exhibits no edema.  Neurological: She is alert and oriented to person, place, and time.  Skin: Skin is warm and dry.   ECG interpretation - Sinus rhythm.  Results for orders placed or performed in visit on 09/14/15 (from the past 24 hour(s))  POCT Microscopic Urinalysis ([facility])     Status: Abnormal   Collection Time: 09/14/15  2:46 PM  Result Value Ref Range   WBC,UR,HPF,POC Few (A) None WBC/hpf   RBC,UR,HPF,POC None None RBC/hpf   Bacteria None None, Too numerous to count   Mucus Absent Absent   Epithelial Cells, UR Per Microscopy Few (A) None, Too numerous to count cells/hpf  POCT urinalysis dipstick     Status: Abnormal   Collection Time: 09/14/15  2:46 PM  Result Value Ref Range   Color, UA yellow yellow   Clarity, UA clear clear   Glucose, UA negative negative   Bilirubin, UA negative negative   Ketones, POC UA negative negative   Spec Grav, UA 1.015  Blood, UA negative negative   pH, UA 8.5    Protein Ur, POC negative negative   Urobilinogen, UA 0.2    Nitrite, UA Negative Negative   Leukocytes, UA small  (1+) (A) Negative  POCT CBC     Status: Abnormal   Collection Time: 09/14/15  2:47 PM  Result Value Ref Range   WBC 7.1 4.6 - 10.2 K/uL   Lymph, poc 2.4 0.6 - 3.4   POC LYMPH PERCENT 33.3 10 - 50 %L   MID (cbc) 0.8 0 - 0.9   POC MID % 11.7 0 - 12 %M   POC Granulocyte 3.9 2 - 6.9   Granulocyte percent 55.0 37 - 80 %G   RBC 3.76 (A) 4.04 - 5.48 M/uL   Hemoglobin 12.1 (A) 12.2 - 16.2 g/dL   HCT, POC 34.5 (A) 37.7 - 47.9 %   MCV 91.8 80 - 97 fL   MCH, POC 32.3 (A) 27 - 31.2 pg   MCHC 35.1 31.8 - 35.4 g/dL   RDW, POC 13.2 %   Platelet Count, POC 277 142 - 424 K/uL   MPV 7.7 0 - 99.8 fL   Assessment and Plan :   1. Other chest pain 2. Dysuria 3. Shortness of breath 4. Chills 5. Malaise - Patient declined further work up including x-ray, additional labs. I discussed differential with her and she would simply like to treat her dysuria. I will have her start Keflex, urine culture pending. Patient will rtc if no improvement in her symptoms.  Jaynee Eagles, PA-C [facility] [phone] 09/14/2015 2:05 PM

## 2015-09-15 LAB — URINE CULTURE: ORGANISM ID, BACTERIA: NO GROWTH

## 2015-09-17 ENCOUNTER — Encounter: Payer: Self-pay | Admitting: Urgent Care

## 2015-09-19 ENCOUNTER — Telehealth: Payer: Self-pay | Admitting: *Deleted

## 2015-09-23 NOTE — Telephone Encounter (Signed)
Patient was given the correct recommendations. She is to rtc for a recheck or follow up with PCP.

## 2015-09-27 ENCOUNTER — Encounter (HOSPITAL_COMMUNITY): Payer: Self-pay | Admitting: Family Medicine

## 2015-09-27 ENCOUNTER — Ambulatory Visit (HOSPITAL_COMMUNITY)
Admission: EM | Admit: 2015-09-27 | Discharge: 2015-09-27 | Disposition: A | Discharge status: Home / Self Care | Payer: Medicare Other | Attending: Family Medicine | Admitting: Family Medicine

## 2015-09-27 DIAGNOSIS — B009 Herpesviral infection, unspecified: Secondary | ICD-10-CM | POA: Diagnosis not present

## 2015-09-27 MED ORDER — VALACYCLOVIR HCL 500 MG PO TABS: 500.0000 mg | ORAL_TABLET | Freq: Two times a day (BID) | ORAL | 3 refills | Status: DC

## 2015-09-27 MED ORDER — MUPIROCIN CALCIUM 2 % NA OINT: TOPICAL_OINTMENT | NASAL | 3 refills | Status: DC

## 2015-09-27 NOTE — ED Provider Notes (Signed)
[place]    CSN: [id number] Arrival date & time: 09/27/15  1200  First Provider Contact:  First MD Initiated Contact with Patient 09/27/15 1220        History   Chief Complaint Chief Complaint  Patient presents with  . Sore    HPI [name] is a 81 y.o. female.   This 81 yo woman is here for a painful lesion on the anterior surface of her left shin which appeared about two weeks ago.  This is a recurrent lesion that has been biopsied (indeterminate) and evaluated by two local dermatologists, her internist, and dermatologist at [facility].  Patient has h/o C. Diff. In response to antibiotics in the past.  She has been told she might have psoriasis on her right forearm and possibly purpura (idiopathic)  Objective: BP 130/70 (BP Location: Right Arm)   Pulse 78   Temp 98.6 F (37 C) (Oral)   Resp 18   SpO2 99%  Exam shows 1.3 cm vesicular annular lesion with underlying erythema, dry but threatening to ooze.  There are two 101m lesions proximally No edema of the legs  Assessment:  This has the features of a herpetic lesion.  It is historically recurrent.  There may be an element of bacterial cellulitis.  Plan:  Valtrex and bactroban ointment. HSV (herpes simplex virus) infection  (primary encounter diagnosis)    KRobyn Haber MD      Past Medical History:  Diagnosis Date  . ABDOMINAL PAIN, CHRONIC 05/28/2008  . ALLERGIC RHINITIS 09/21/2006  . ANEMIA-NOS 09/21/2006  . ASTHMA 09/21/2006  . ASYMPTOMATIC POSTMENOPAUSAL STATUS 05/28/2008  . Cataract   . CHEST PAIN, ATYPICAL 05/28/2008  . COPD (chronic obstructive pulmonary disease) ([place]   . DDD (degenerative disc disease)   . DIVERTICULITIS, HX OF 09/21/2006  . GERD 09/21/2006  . HYPERCHOLESTEROLEMIA 06/05/2009  . HYPERTENSION 09/21/2006  . HYPOTHYROIDISM 09/21/2006  . Peptic ulcer disease   . RENAL INSUFFICIENCY 09/21/2006  . [place] bowel obstruction ([place]   . Somatization disorder 04/25/2007  .  UNSPECIFIED URINARY CALCULUS 05/28/2008    Patient Active Problem List   Diagnosis Date Noted  . Other nonspecific abnormal finding of lung field 08/14/2015  . Hyponatremia 07/21/2014  . Menopausal state 07/18/2014  . Hematest positive stools 08/05/2013  . Anemia due to other cause 08/05/2013  . Cerumen impaction 06/03/2013  . History of adrenal disorder 10/15/2012  . Rash and nonspecific skin eruption 10/15/2012  . Iron deficiency anemia 06/20/2012  . Psychogenic polydipsia 06/20/2012  . Nonspecific abnormal electrocardiogram (ECG) (EKG) 06/20/2012  . Polyuria 05/02/2012  . Pelvic pain in female 08/16/2011  . Leg ulcer ([place] 06/16/2010  . Routine general medical examination at a health care facility 06/11/2010  . Encounter for long-term (current) use of other medications 06/11/2010  . HYPERCHOLESTEROLEMIA 06/05/2009  . UNSPECIFIED URINARY CALCULUS 05/28/2008  . CHEST PAIN, ATYPICAL 05/28/2008  . ABDOMINAL PAIN, CHRONIC 05/28/2008  . ASYMPTOMATIC POSTMENOPAUSAL STATUS 05/28/2008  . SOMATIZATION DISORDER 04/25/2007  . Hypothyroidism 09/21/2006  . Essential hypertension 09/21/2006  . ALLERGIC RHINITIS 09/21/2006  . ASTHMA 09/21/2006  . GERD 09/21/2006  . Disorder resulting from impaired renal function 09/21/2006  . DIVERTICULITIS, HX OF 09/21/2006    Past Surgical History:  Procedure Laterality Date  . ABDOMINAL HYSTERECTOMY  1981  . acute Nephritis  1958  . APPENDECTOMY  1959  . Asthma & Pneumonia Birth  1970's  . CESAREAN SECTION    . CHOLECYSTECTOMY  1983  . COLON  SURGERY    . DENTAL SURGERY  1970  . NASAL SINUS SURGERY  1967  . Pulmonary thrombosis    . TONSILLECTOMY    . TUBAL LIGATION      OB History    No data available       Home Medications    Prior to Admission medications   Medication Sig Start Date End Date Taking? Authorizing Provider  cephALEXin (KEFLEX) 500 MG capsule Take 1 capsule (500 mg total) by mouth 2 (two) times daily. 09/14/15   Jaynee Eagles, PA-C  cyanocobalamin 500 MCG tablet Take 500 mcg by mouth daily.    Historical Provider, MD  Ferrous Sulfate (IRON) 325 (65 FE) MG TABS Take 1 tablet by mouth as needed.    Historical Provider, MD  fluticasone (FLOVENT HFA) 110 MCG/ACT inhaler Inhale 2 puffs into the lungs 2 (two) times daily. 08/14/15   Renato Shin, MD  levothyroxine (SYNTHROID, LEVOTHROID) 75 MCG tablet TAKE 1 TABLET (75 MCG TOTAL) BY MOUTH DAILY BEFORE BREAKFAST.(WILL PAY 9/25) 09/13/15   Renato Shin, MD  magnesium 30 MG tablet Take 30 mg by mouth 2 (two) times daily.    Historical Provider, MD  Multiple Vitamin (MULTIVITAMIN) tablet Take 1 tablet by mouth daily.      Historical Provider, MD    Family History Family History  Problem Relation Age of Onset  . Cancer Mother   . Cancer Father     Social History Social History  Substance Use Topics  . Smoking status: Never Smoker  . Smokeless tobacco: Never Used  . Alcohol use No     Allergies   Metronidazole; Macrobid [nitrofurantoin]; and Pepcid [famotidine]   Review of Systems Review of Systems   Physical Exam Triage Vital Signs ED Triage Vitals [09/27/15 1222]  Enc Vitals Group     BP 130/70     Pulse Rate 78     Resp 18     Temp 98.6 F (37 C)     Temp Source Oral     SpO2 99 %     Weight      Height      Head Circumference      Peak Flow      Pain Score 7     Pain Loc      Pain Edu?      Excl. in [place]?    No data found.   Updated Vital Signs BP 130/70 (BP Location: Right Arm)   Pulse 78   Temp 98.6 F (37 C) (Oral)   Resp 18   SpO2 99%   Visual Acuity Right Eye Distance:   Left Eye Distance:   Bilateral Distance:    Right Eye Near:   Left Eye Near:    Bilateral Near:     Physical Exam   UC Treatments / Results  Labs (all labs ordered are listed, but only abnormal results are displayed) Labs Reviewed - No data to display  EKG  EKG Interpretation None       Radiology No results  found.  Procedures Procedures (including critical care time)  Medications Ordered in UC Medications - No data to display   Initial Impression / Assessment and Plan / UC Course  I have reviewed the triage vital signs and the nursing notes.  Pertinent labs & imaging results that were available during my care of the patient were reviewed by me and considered in my medical decision making (see chart for details).  Clinical Course  Final Clinical Impressions(s) / UC Diagnoses   Final diagnoses:  None    New Prescriptions New Prescriptions   No medications on file     Robyn Haber, MD 09/27/15 1242

## 2015-09-27 NOTE — Discharge Instructions (Signed)
If not improving in 3 days, you may see Dr. Loanne Drilling or call me at [phone]

## 2015-09-27 NOTE — ED Triage Notes (Signed)
Pt  Reports     Symptoms     Of  A  Sore   On l  Lower leg   Which  Has  Been there  For      For  7  Years       She  Reports  It has been getting  Worse over  The  Last several  Days

## 2015-10-01 ENCOUNTER — Emergency Department (HOSPITAL_COMMUNITY)
Admission: EM | Admit: 2015-10-01 | Discharge: 2015-10-01 | Disposition: A | Discharge status: Home / Self Care | Payer: Medicare Other | Attending: Emergency Medicine | Admitting: Emergency Medicine

## 2015-10-01 ENCOUNTER — Emergency Department (HOSPITAL_COMMUNITY): Payer: Medicare Other

## 2015-10-01 ENCOUNTER — Ambulatory Visit (HOSPITAL_COMMUNITY)
Admission: EM | Admit: 2015-10-01 | Discharge: 2015-10-01 | Disposition: A | Discharge status: Nursing Home (Includes ICF) | Payer: Medicare Other | Attending: Emergency Medicine | Admitting: Emergency Medicine

## 2015-10-01 ENCOUNTER — Encounter (HOSPITAL_COMMUNITY): Payer: Self-pay | Admitting: *Deleted

## 2015-10-01 ENCOUNTER — Encounter (HOSPITAL_COMMUNITY): Payer: Self-pay | Admitting: Emergency Medicine

## 2015-10-01 ENCOUNTER — Telehealth: Payer: Self-pay | Admitting: Endocrinology

## 2015-10-01 DIAGNOSIS — R42 Dizziness and giddiness: Secondary | ICD-10-CM | POA: Diagnosis present

## 2015-10-01 DIAGNOSIS — H5713 Ocular pain, bilateral: Secondary | ICD-10-CM | POA: Diagnosis not present

## 2015-10-01 DIAGNOSIS — I10 Essential (primary) hypertension: Secondary | ICD-10-CM | POA: Insufficient documentation

## 2015-10-01 DIAGNOSIS — R51 Headache: Secondary | ICD-10-CM | POA: Diagnosis not present

## 2015-10-01 DIAGNOSIS — E039 Hypothyroidism, unspecified: Secondary | ICD-10-CM | POA: Insufficient documentation

## 2015-10-01 DIAGNOSIS — J449 Chronic obstructive pulmonary disease, unspecified: Secondary | ICD-10-CM | POA: Diagnosis not present

## 2015-10-01 DIAGNOSIS — R52 Pain, unspecified: Secondary | ICD-10-CM

## 2015-10-01 DIAGNOSIS — H8111 Benign paroxysmal vertigo, right ear: Secondary | ICD-10-CM | POA: Diagnosis not present

## 2015-10-01 DIAGNOSIS — B029 Zoster without complications: Secondary | ICD-10-CM

## 2015-10-01 LAB — BASIC METABOLIC PANEL
Anion gap: 7 (ref 5–15)
BUN: 10 mg/dL (ref 6–20)
CALCIUM: 10.3 mg/dL (ref 8.9–10.3)
CO2: 28 mmol/L (ref 22–32)
CREATININE: 0.67 mg/dL (ref 0.44–1.00)
Chloride: 99 mmol/L — ABNORMAL LOW (ref 101–111)
GFR calc Af Amer: >60 mL/min (ref >60)
GLUCOSE: 96 mg/dL (ref 65–99)
Potassium: 4.4 mmol/L (ref 3.5–5.1)
SODIUM: 134 mmol/L — AB (ref 135–145)

## 2015-10-01 LAB — CBC
HEMATOCRIT: 36.8 % (ref 36.0–46.0)
Hemoglobin: 12.3 g/dL (ref 12.0–15.0)
MCH: 31.6 pg (ref 26.0–34.0)
MCHC: 33.4 g/dL (ref 30.0–36.0)
MCV: 94.6 fL (ref 78.0–100.0)
PLATELETS: 314 10*3/uL (ref 150–400)
RBC: 3.89 MIL/uL (ref 3.87–5.11)
RDW: 13 % (ref 11.5–15.5)
WBC: 8 10*3/uL (ref 4.0–10.5)

## 2015-10-01 LAB — CBG MONITORING, ED: Glucose-Capillary: 105 mg/dL — ABNORMAL HIGH (ref 65–99)

## 2015-10-01 MED ORDER — MECLIZINE HCL 25 MG PO TABS
25.0000 mg | ORAL_TABLET | Freq: Once | ORAL | Status: AC
  Administered 2015-10-01: 25 mg via ORAL
  Filled 2015-10-01: qty 1

## 2015-10-01 MED ORDER — MECLIZINE HCL 25 MG PO TABS
25.0000 mg | ORAL_TABLET | Freq: Three times a day (TID) | ORAL | 0 refills | Status: DC | PRN
Start: 2015-10-01 — End: 2016-08-15

## 2015-10-01 NOTE — ED Triage Notes (Signed)
The patient presented to the [facility] in reference to a follow up. The patient reported that she was diagnosed with shingles on 09/27/2015 and prescribed Valtrex. The patient stated that she was advised to return if she was not better in 4 days.

## 2015-10-01 NOTE — Telephone Encounter (Signed)
I contacted the patient and advised of add on appointment. Pt declined on scheduling the appointment for 1115 tomorrow(10/02/2015). Patient stated she will be returning the urgent care this evening to be evaluated.

## 2015-10-01 NOTE — ED Triage Notes (Signed)
Pt states dizziness since this am.  States she was dx with shingles since Sunday and has taken antivirals and keflex.  Also c/o "internal burning" all over.

## 2015-10-01 NOTE — Telephone Encounter (Signed)
PT called in, she went to the [facility] and they diagnosed her with shingles along with giving her some medications.  They informed her that if she has not gotten any better within 4 days to contact Dr. Loanne Drilling to be seen by him.  She stated that today she feels as if she has gotten worse instead of better and said she needs to be seen by Dr. Loanne Drilling ASAP.  He doesn't have an opening and patient needs to know what she needs to do. Please advise.

## 2015-10-01 NOTE — Telephone Encounter (Signed)
Add on, Tomorrow, 11: 15 AM

## 2015-10-01 NOTE — ED Provider Notes (Signed)
[facility] DEPT Provider Note   CSN: [id number] Arrival date & time: 10/01/15  1647     History   Chief Complaint Chief Complaint  Patient presents with  . Dizziness    HPI [name] is a 81 y.o. female.  81 yo F with a chief complaint of dizziness. This been going on for the past day though when I further discussed this with the patient she has had dizziness off and on for many months. She has been seen multiple times by her family physician as well as had multiple urgent care visits for the same. Patient describes a burning from her head down to her pelvis. She is unsure what makes this better or worse. Dizziness is worse with eye movement or head movement. Usually resolves quickly with closing her eyes or not moving her head. Patient has had chronic cough congestion that she relates to allergies this season. Denies any worsening of her baseline. Has a history of COPD denies worsening of her lung disease. Patient has a chronic skin change to her left lower extremity which is been evaluated over the past 7 years by dermatology. She suspects that it's vasculitis however this has not been yet diagnosed. She was diagnosed later at urgent care that she had shingles. She has finished her Valtrex and thinks that's the cause of her current symptoms.   The history is provided by the patient and a relative.  Dizziness  Quality:  Head spinning Severity:  Severe Onset quality:  Gradual Duration:  1 day Timing:  Constant Progression:  Worsening Chronicity:  Recurrent Context: eye movement and head movement   Relieved by:  Being still Worsened by:  Eye movement, movement, standing up and turning head Ineffective treatments:  None tried Associated symptoms: weakness   Associated symptoms: no chest pain, no headaches, no nausea, no palpitations, no shortness of breath and no vomiting   Risk factors: hx of vertigo     Past Medical History:  Diagnosis Date  . ABDOMINAL PAIN, CHRONIC  05/28/2008  . ALLERGIC RHINITIS 09/21/2006  . ANEMIA-NOS 09/21/2006  . ASTHMA 09/21/2006  . ASYMPTOMATIC POSTMENOPAUSAL STATUS 05/28/2008  . Cataract   . CHEST PAIN, ATYPICAL 05/28/2008  . COPD (chronic obstructive pulmonary disease) ([place])   . DDD (degenerative disc disease)   . DIVERTICULITIS, HX OF 09/21/2006  . GERD 09/21/2006  . HYPERCHOLESTEROLEMIA 06/05/2009  . HYPERTENSION 09/21/2006  . HYPOTHYROIDISM 09/21/2006  . Peptic ulcer disease   . RENAL INSUFFICIENCY 09/21/2006  . Small bowel obstruction ([place])   . Somatization disorder 04/25/2007  . UNSPECIFIED URINARY CALCULUS 05/28/2008    Patient Active Problem List   Diagnosis Date Noted  . Other nonspecific abnormal finding of lung field 08/14/2015  . Hyponatremia 07/21/2014  . Menopausal state 07/18/2014  . Hematest positive stools 08/05/2013  . Anemia due to other cause 08/05/2013  . Cerumen impaction 06/03/2013  . History of adrenal disorder 10/15/2012  . Rash and nonspecific skin eruption 10/15/2012  . Iron deficiency anemia 06/20/2012  . Psychogenic polydipsia 06/20/2012  . Nonspecific abnormal electrocardiogram (ECG) (EKG) 06/20/2012  . Polyuria 05/02/2012  . Pelvic pain in female 08/16/2011  . Leg ulcer ([place]) 06/16/2010  . Routine general medical examination at a health care facility 06/11/2010  . Encounter for long-term (current) use of other medications 06/11/2010  . HYPERCHOLESTEROLEMIA 06/05/2009  . UNSPECIFIED URINARY CALCULUS 05/28/2008  . CHEST PAIN, ATYPICAL 05/28/2008  . ABDOMINAL PAIN, CHRONIC 05/28/2008  . ASYMPTOMATIC POSTMENOPAUSAL STATUS 05/28/2008  . SOMATIZATION DISORDER  04/25/2007  . Hypothyroidism 09/21/2006  . Essential hypertension 09/21/2006  . ALLERGIC RHINITIS 09/21/2006  . ASTHMA 09/21/2006  . GERD 09/21/2006  . Disorder resulting from impaired renal function 09/21/2006  . DIVERTICULITIS, HX OF 09/21/2006    Past Surgical History:  Procedure Laterality Date  . ABDOMINAL HYSTERECTOMY  1981    . acute Nephritis  1958  . APPENDECTOMY  1959  . Asthma & Pneumonia Birth  1970's  . CESAREAN SECTION    . CHOLECYSTECTOMY  1983  . COLON SURGERY    . DENTAL SURGERY  1970  . NASAL SINUS SURGERY  1967  . Pulmonary thrombosis    . TONSILLECTOMY    . TUBAL LIGATION      OB History    No data available       Home Medications    Prior to Admission medications   Medication Sig Start Date End Date Taking? Authorizing Provider  cephALEXin (KEFLEX) 500 MG capsule Take 1 capsule (500 mg total) by mouth 2 (two) times daily. 09/14/15   Jaynee Eagles, PA-C  cyanocobalamin 500 MCG tablet Take 500 mcg by mouth daily.    Historical Provider, MD  Ferrous Sulfate (IRON) 325 (65 FE) MG TABS Take 1 tablet by mouth as needed.    Historical Provider, MD  fluticasone (FLOVENT HFA) 110 MCG/ACT inhaler Inhale 2 puffs into the lungs 2 (two) times daily. 08/14/15   Renato Shin, MD  levothyroxine (SYNTHROID, LEVOTHROID) 75 MCG tablet TAKE 1 TABLET (75 MCG TOTAL) BY MOUTH DAILY BEFORE BREAKFAST.(WILL PAY 9/25) 09/13/15   Renato Shin, MD  magnesium 30 MG tablet Take 30 mg by mouth 2 (two) times daily.    Historical Provider, MD  meclizine (ANTIVERT) 25 MG tablet Take 1 tablet (25 mg total) by mouth 3 (three) times daily as needed for dizziness. 10/01/15   Deno Etienne, DO  Multiple Vitamin (MULTIVITAMIN) tablet Take 1 tablet by mouth daily.      Historical Provider, MD  mupirocin nasal ointment (BACTROBAN) 2 % Apply in each nostril daily 09/27/15   Robyn Haber, MD  valACYclovir (VALTREX) 500 MG tablet Take 1 tablet (500 mg total) by mouth 2 (two) times daily. 09/27/15   Robyn Haber, MD    Family History Family History  Problem Relation Age of Onset  . Cancer Mother   . Cancer Father     Social History Social History  Substance Use Topics  . Smoking status: Never Smoker  . Smokeless tobacco: Never Used  . Alcohol use No     Allergies   Metronidazole; Macrobid [nitrofurantoin]; and Pepcid  [famotidine]   Review of Systems Review of Systems  Constitutional: Negative for chills and fever.  HENT: Negative for congestion and rhinorrhea.   Eyes: Negative for redness and visual disturbance.  Respiratory: Negative for shortness of breath and wheezing.   Cardiovascular: Negative for chest pain and palpitations.  Gastrointestinal: Negative for nausea and vomiting.  Genitourinary: Negative for dysuria and urgency.  Musculoskeletal: Negative for arthralgias and myalgias.  Skin: Negative for pallor and wound.  Neurological: Positive for dizziness and weakness. Negative for headaches.     Physical Exam Updated Vital Signs BP 142/65   Pulse 70   Temp 98.4 F (36.9 C)   Resp 16   Ht 5' 6"  (1.676 m)   Wt 135 lb (61.2 kg)   SpO2 100%   BMI 21.79 kg/m   Physical Exam  Constitutional: She is oriented to person, place, and time. She appears well-developed and well-nourished.  No distress.  HENT:  Head: Normocephalic and atraumatic.  Eyes: EOM are normal. Pupils are equal, round, and reactive to light.  Neck: Normal range of motion. Neck supple.  Cardiovascular: Normal rate and regular rhythm.  Exam reveals no gallop and no friction rub.   No murmur heard. Pulmonary/Chest: Effort normal. She has no wheezes. She has no rales.  Abdominal: Soft. She exhibits no distension. There is no tenderness.  Musculoskeletal: She exhibits no edema or tenderness.  Neurological: She is alert and oriented to person, place, and time. She has normal strength. No cranial nerve deficit or sensory deficit. She displays a negative Romberg sign. Coordination and gait normal. GCS eye subscore is 4. GCS verbal subscore is 5. GCS motor subscore is 6. She displays no Babinski's sign on the right side. She displays no Babinski's sign on the left side.  Reflex Scores:      Tricep reflexes are 2+ on the right side and 2+ on the left side.      Bicep reflexes are 2+ on the right side and 2+ on the left side.       Brachioradialis reflexes are 2+ on the right side and 2+ on the left side.      Patellar reflexes are 2+ on the right side and 2+ on the left side.      Achilles reflexes are 2+ on the right side and 2+ on the left side. Right-sided fast going nystagmus  Skin: Skin is warm and dry. She is not diaphoretic.  Psychiatric: She has a normal mood and affect. Her behavior is normal.  Nursing note and vitals reviewed.    ED Treatments / Results  Labs (all labs ordered are listed, but only abnormal results are displayed) Labs Reviewed  BASIC METABOLIC PANEL - Abnormal; Notable for the following:       Result Value   Sodium 134 (*)    Chloride 99 (*)    All other components within normal limits  CBG MONITORING, ED - Abnormal; Notable for the following:    Glucose-Capillary 105 (*)    All other components within normal limits  CBC    EKG  EKG Interpretation  Date/Time:  Thursday October 01 2015 16:54:46 EDT Ventricular Rate:  75 PR Interval:  154 QRS Duration: 80 QT Interval:  382 QTC Calculation: 426 R Axis:     Text Interpretation:  Normal sinus rhythm Nonspecific ST abnormality Abnormal ECG No old tracing to compare Confirmed by Khari Mally MD, DANIEL [phone]) on 10/01/2015 6:30:56 PM       Radiology Ct Head Wo Contrast  Result Date: 10/01/2015 CLINICAL DATA:  Headache EXAM: CT HEAD WITHOUT CONTRAST TECHNIQUE: Contiguous axial images were obtained from the base of the skull through the vertex without intravenous contrast. COMPARISON:  03/10/2006 FINDINGS: Brain: No evidence of acute infarction, hemorrhage, hydrocephalus, extra-axial collection or mass lesion/mass effect. Vascular: No hyperdense vessel or unexpected calcification. Skull: No evidence of calvarial fracture. Sinuses/Orbits: Partial opacification of the bilateral ethmoid and left maxillary sinuses. Mild mucosal thickening of the right maxillary sinus. Bilateral mastoid air cells are clear. Other: Cerebral volume is within  normal limits. No ventriculomegaly. Subcortical white matter and periventricular small vessel ischemic changes. Mild intracranial atherosclerosis. IMPRESSION: No evidence of acute intracranial abnormality. Small vessel ischemic changes. Electronically Signed   By: Julian Hy M.D.   On: 10/01/2015 19:48    Procedures Procedures (including critical care time)  Medications Ordered in ED Medications  meclizine (ANTIVERT) tablet 25 mg (25  mg Oral Given 10/01/15 1919)     Initial Impression / Assessment and Plan / ED Course  I have reviewed the triage vital signs and the nursing notes.  Pertinent labs & imaging results that were available during my care of the patient were reviewed by me and considered in my medical decision making (see chart for details).  Clinical Course    81 yo F With a chief complaint of vertigo. Sounds peripheral by history. Symptoms are re-created with rapid eye movement to the right and associated with right-sided fast going nystagmus. Suspect that this is BPPV.  The patient is also had a headache that is atypical from her normal obtain a CT of the head. Labs with possible mild dehydration. PCP follow-up.  7:59 PM: I have discussed the diagnosis/risks/treatment options with the patient and believe the pt to be eligible for discharge home to follow-up with PCP. We also discussed returning to the ED immediately if new or worsening sx occur. We discussed the sx which are most concerning (e.g., sudden worsening pain, fever, inability to tolerate by mouth) that necessitate immediate return. Medications administered to the patient during their visit and any new prescriptions provided to the patient are listed below.  Medications given during this visit Medications  meclizine (ANTIVERT) tablet 25 mg (25 mg Oral Given 10/01/15 1919)     The patient appears reasonably screen and/or stabilized for discharge and I doubt any other medical condition or other [facility] requiring  further screening, evaluation, or treatment in the ED at this time prior to discharge.    Final Clinical Impressions(s) / ED Diagnoses   Final diagnoses:  BPPV (benign paroxysmal positional vertigo), right    New Prescriptions New Prescriptions   MECLIZINE (ANTIVERT) 25 MG TABLET    Take 1 tablet (25 mg total) by mouth 3 (three) times daily as needed for dizziness.     Deno Etienne, DO 10/01/15 [phone]

## 2015-10-01 NOTE — ED Notes (Signed)
Patient is stable, verbalized understanding of discharge teaching, patient's son will be driving her back to senior community facility.

## 2015-10-01 NOTE — ED Provider Notes (Signed)
CSN: [id number]     Arrival date & time 10/01/15  1535 History   First MD Initiated Contact with Patient 10/01/15 1608     Chief Complaint  Patient presents with  . Follow-up   (Consider location/radiation/quality/duration/timing/severity/associated sxs/prior Treatment) HPI  [name] is a 81 y.o. female presenting to UC with c/o severe bilateral eye pain with generalized headache and dizziness. Pt notes symptoms started about 8AM this morning, associated body aches and chills. She states she is concerned her shingles on Left lower leg diagnosed 4 days ago has now "spread throughout her body."  Pt states she cannot take acetaminophen or ibuprofen due to GI disease.  She notes the shingles rash does appear slightly improved, more dried than it was 4 days ago.  She believes the Valtrex did help but her main concern is eye pain and dizziness as she states "I barely made it here." pt accompanied by her son.  Denies n/v/d.  Denies numbness or tingling in arms or legs. Denies hx of stroke.    Past Medical History:  Diagnosis Date  . ABDOMINAL PAIN, CHRONIC 05/28/2008  . ALLERGIC RHINITIS 09/21/2006  . ANEMIA-NOS 09/21/2006  . ASTHMA 09/21/2006  . ASYMPTOMATIC POSTMENOPAUSAL STATUS 05/28/2008  . Cataract   . CHEST PAIN, ATYPICAL 05/28/2008  . COPD (chronic obstructive pulmonary disease) ([place])   . DDD (degenerative disc disease)   . DIVERTICULITIS, HX OF 09/21/2006  . GERD 09/21/2006  . HYPERCHOLESTEROLEMIA 06/05/2009  . HYPERTENSION 09/21/2006  . HYPOTHYROIDISM 09/21/2006  . Peptic ulcer disease   . RENAL INSUFFICIENCY 09/21/2006  . Small bowel obstruction ([place])   . Somatization disorder 04/25/2007  . UNSPECIFIED URINARY CALCULUS 05/28/2008   Past Surgical History:  Procedure Laterality Date  . ABDOMINAL HYSTERECTOMY  1981  . acute Nephritis  1958  . APPENDECTOMY  1959  . Asthma & Pneumonia Birth  1970's  . CESAREAN SECTION    . CHOLECYSTECTOMY  1983  . COLON SURGERY    . DENTAL SURGERY   1970  . NASAL SINUS SURGERY  1967  . Pulmonary thrombosis    . TONSILLECTOMY    . TUBAL LIGATION     Family History  Problem Relation Age of Onset  . Cancer Mother   . Cancer Father    Social History  Substance Use Topics  . Smoking status: Never Smoker  . Smokeless tobacco: Never Used  . Alcohol use No   OB History    No data available     Review of Systems  Constitutional: Positive for chills and fatigue. Negative for fever.  HENT: Negative for congestion, ear pain and facial swelling.   Eyes: Positive for pain. Negative for photophobia, redness and visual disturbance.  Respiratory: Negative for cough, chest tightness and shortness of breath.   Cardiovascular: Negative for chest pain and palpitations.  Gastrointestinal: Negative for abdominal pain, diarrhea, nausea and vomiting.  Musculoskeletal: Positive for arthralgias and myalgias.  Skin: Positive for color change and rash. Negative for wound.  Neurological: Positive for dizziness, weakness ( generalized) and headaches. Negative for syncope, facial asymmetry, light-headedness and numbness.    Allergies  Metronidazole; Macrobid [nitrofurantoin]; and Pepcid [famotidine]  Home Medications   Prior to Admission medications   Medication Sig Start Date End Date Taking? Authorizing Provider  cephALEXin (KEFLEX) 500 MG capsule Take 1 capsule (500 mg total) by mouth 2 (two) times daily. 09/14/15   Jaynee Eagles, PA-C  cyanocobalamin 500 MCG tablet Take 500 mcg by mouth daily.  Historical Provider, MD  Ferrous Sulfate (IRON) 325 (65 FE) MG TABS Take 1 tablet by mouth as needed.    Historical Provider, MD  fluticasone (FLOVENT HFA) 110 MCG/ACT inhaler Inhale 2 puffs into the lungs 2 (two) times daily. 08/14/15   Renato Shin, MD  levothyroxine (SYNTHROID, LEVOTHROID) 75 MCG tablet TAKE 1 TABLET (75 MCG TOTAL) BY MOUTH DAILY BEFORE BREAKFAST.(WILL PAY 9/25) 09/13/15   Renato Shin, MD  magnesium 30 MG tablet Take 30 mg by mouth 2 (two)  times daily.    Historical Provider, MD  Multiple Vitamin (MULTIVITAMIN) tablet Take 1 tablet by mouth daily.      Historical Provider, MD  mupirocin nasal ointment (BACTROBAN) 2 % Apply in each nostril daily 09/27/15   Robyn Haber, MD  valACYclovir (VALTREX) 500 MG tablet Take 1 tablet (500 mg total) by mouth 2 (two) times daily. 09/27/15   Robyn Haber, MD   Meds Ordered and Administered this Visit  Medications - No data to display  BP 159/78 (BP Location: Right Arm)   Pulse 76   Temp 98.3 F (36.8 C) (Oral)   Resp 16   SpO2 98%  No data found.   Physical Exam  Constitutional: She is oriented to person, place, and time. She appears well-developed and well-nourished. No distress.  HENT:  Head: Normocephalic and atraumatic.  Eyes: Conjunctivae and EOM are normal. Pupils are equal, round, and reactive to light. No scleral icterus.  Neck: Normal range of motion. Neck supple.  Cardiovascular: Normal rate, regular rhythm and normal heart sounds.   Pulmonary/Chest: Effort normal and breath sounds normal. No respiratory distress. She has no wheezes. She has no rales. She exhibits no tenderness.  Abdominal: Soft. She exhibits no distension. There is no tenderness.  Musculoskeletal: Normal range of motion.  Neurological: She is alert and oriented to person, place, and time.  CN II-XII in tact. Speech is clear. Alert to person place and time.  Normal finger to nose coordination. Normal gait. Difficulty with heal-to-toe walk, however, pt does use cane to help with ambulation.  Skin: Skin is warm and dry. Capillary refill takes less than 2 seconds. Rash noted. She is not diaphoretic. There is erythema.  Left lower leg: 4cm area of erythema and dried skin rash to anterior aspect of shin. Faint erythematous 75m papules superior to rash.  Rashes do blanch. No bleeding or discharge. Tender.   Nursing note and vitals reviewed.   Urgent Care Course   Clinical Course    Procedures  (including critical care time)  Labs Review Labs Reviewed - No data to display  Imaging Review No results found.    MDM   1. Eye pain, bilateral   2. Dizziness   3. Body aches   4. Shingles    Pt presenting to UC with multiple complaints, initially starting as shingles rash f/u but pt's pain concern is severe bilateral eye pain and dizziness. Pt afebrile in clinic however pt c/o chills.  Shingles rash does appear to be healing well, however, recommend pt go to emergency department for further evaluation of severe eye pain and dizziness.  Pt accompanied by her son.    ENoland Fordyce PA-C 10/01/15 [phone]

## 2015-11-25 ENCOUNTER — Other Ambulatory Visit: Payer: Self-pay

## 2015-11-25 MED ORDER — FLUTICASONE PROPIONATE HFA 110 MCG/ACT IN AERO: 2.0000 | INHALATION_SPRAY | Freq: Two times a day (BID) | RESPIRATORY_TRACT | 4 refills | Status: DC

## 2015-12-01 ENCOUNTER — Encounter (HOSPITAL_COMMUNITY): Payer: Self-pay | Admitting: Emergency Medicine

## 2015-12-01 ENCOUNTER — Emergency Department (HOSPITAL_COMMUNITY): Payer: Medicare Other

## 2015-12-01 ENCOUNTER — Emergency Department (HOSPITAL_COMMUNITY)
Admission: EM | Admit: 2015-12-01 | Discharge: 2015-12-02 | Disposition: A | Discharge status: Home / Self Care | Payer: Medicare Other | Attending: Emergency Medicine | Admitting: Emergency Medicine

## 2015-12-01 DIAGNOSIS — J449 Chronic obstructive pulmonary disease, unspecified: Secondary | ICD-10-CM | POA: Diagnosis not present

## 2015-12-01 DIAGNOSIS — Z79899 Other long term (current) drug therapy: Secondary | ICD-10-CM | POA: Insufficient documentation

## 2015-12-01 DIAGNOSIS — R1013 Epigastric pain: Secondary | ICD-10-CM | POA: Diagnosis not present

## 2015-12-01 DIAGNOSIS — R079 Chest pain, unspecified: Secondary | ICD-10-CM | POA: Diagnosis not present

## 2015-12-01 DIAGNOSIS — E039 Hypothyroidism, unspecified: Secondary | ICD-10-CM | POA: Diagnosis not present

## 2015-12-01 DIAGNOSIS — R0789 Other chest pain: Secondary | ICD-10-CM | POA: Diagnosis not present

## 2015-12-01 DIAGNOSIS — I10 Essential (primary) hypertension: Secondary | ICD-10-CM | POA: Diagnosis not present

## 2015-12-01 DIAGNOSIS — K2900 Acute gastritis without bleeding: Secondary | ICD-10-CM

## 2015-12-01 HISTORY — DX: Zoster without complications: B02.9

## 2015-12-01 LAB — BASIC METABOLIC PANEL
Anion gap: 8 (ref 5–15)
BUN: 16 mg/dL (ref 6–20)
CO2: 26 mmol/L (ref 22–32)
Calcium: 9.9 mg/dL (ref 8.9–10.3)
Chloride: 103 mmol/L (ref 101–111)
Creatinine, Ser: 0.56 mg/dL (ref 0.44–1.00)
GFR calc Af Amer: >60 mL/min (ref >60)
GFR calc non Af Amer: >60 mL/min (ref >60)
Glucose, Bld: 121 mg/dL — ABNORMAL HIGH (ref 65–99)
Potassium: 3.8 mmol/L (ref 3.5–5.1)
Sodium: 137 mmol/L (ref 135–145)

## 2015-12-01 LAB — CBC
HCT: 35.9 % — ABNORMAL LOW (ref 36.0–46.0)
Hemoglobin: 12.1 g/dL (ref 12.0–15.0)
MCH: 31.4 pg (ref 26.0–34.0)
MCHC: 33.7 g/dL (ref 30.0–36.0)
MCV: 93.2 fL (ref 78.0–100.0)
Platelets: 360 10*3/uL (ref 150–400)
RBC: 3.85 MIL/uL — ABNORMAL LOW (ref 3.87–5.11)
RDW: 13.3 % (ref 11.5–15.5)
WBC: 7.9 10*3/uL (ref 4.0–10.5)

## 2015-12-01 LAB — I-STAT TROPONIN, ED: Troponin i, poc: 0 ng/mL (ref 0.00–0.08)

## 2015-12-01 MED ORDER — HYDROMORPHONE HCL 1 MG/ML IJ SOLN
1.0000 mg | Freq: Once | INTRAMUSCULAR | Status: DC
  Filled 2015-12-01: qty 1

## 2015-12-01 MED ORDER — IOPAMIDOL (ISOVUE-300) INJECTION 61%
100.0000 mL | Freq: Once | INTRAVENOUS | Status: AC | PRN
  Administered 2015-12-01: 100 mL via INTRAVENOUS

## 2015-12-01 MED ORDER — ONDANSETRON HCL 4 MG/2ML IJ SOLN
4.0000 mg | Freq: Once | INTRAMUSCULAR | Status: DC
  Filled 2015-12-01: qty 2

## 2015-12-01 MED ORDER — GI COCKTAIL ~~LOC~~
30.0000 mL | Freq: Once | ORAL | Status: AC
  Administered 2015-12-01: 30 mL via ORAL
  Filled 2015-12-01: qty 30

## 2015-12-01 MED ORDER — SODIUM CHLORIDE 0.9 % IV BOLUS (SEPSIS)
1000.0000 mL | Freq: Once | INTRAVENOUS | Status: AC
  Administered 2015-12-01: 1000 mL via INTRAVENOUS

## 2015-12-01 NOTE — ED Triage Notes (Addendum)
Pt reports to ER c/o epigastric pain radiating into chest; pt has been vomiting since 2pm; emesis has been mucous-like and mostly water; pt has hx of bowel obstruction; rates pain 8/10 and is in obvious distress; hx of GERD as well

## 2015-12-01 NOTE — ED Provider Notes (Signed)
[facility] DEPT Provider Note   CSN: [id number] Arrival date & time: 12/01/15  1908 By signing my name below, I, Georgette Shell, attest that this documentation has been prepared under the direction and in the presence of Virgel Manifold, MD. Electronically Signed: Georgette Shell, ED Scribe. 12/01/15. 8:10 PM.  History   Chief Complaint Chief Complaint  Patient presents with  . Chest Pain   HPI Comments: [name] is a 81 y.o. female with h/o GERD, SBO, and HTN who presents to the Emergency Department complaining of burning, 8/10 epigastric pain radiating to her chest onset 2 pm today. Pt also has associated clear, mucous-like vomiting. Pain is exacerbated with movement and palpation. She has not tried any OTC medications PTA. Pt notes a h/o small bowel obstructions and states her symptoms at this time feel similar. Pt denies fever, diarrhea, or any other associated symptoms.   The history is provided by the patient. No language interpreter was used.    Past Medical History:  Diagnosis Date  . ABDOMINAL PAIN, CHRONIC 05/28/2008  . ALLERGIC RHINITIS 09/21/2006  . ANEMIA-NOS 09/21/2006  . ASTHMA 09/21/2006  . ASYMPTOMATIC POSTMENOPAUSAL STATUS 05/28/2008  . Cataract   . CHEST PAIN, ATYPICAL 05/28/2008  . COPD (chronic obstructive pulmonary disease) ([place])   . DDD (degenerative disc disease)   . DIVERTICULITIS, HX OF 09/21/2006  . GERD 09/21/2006  . HYPERCHOLESTEROLEMIA 06/05/2009  . HYPERTENSION 09/21/2006  . HYPOTHYROIDISM 09/21/2006  . Peptic ulcer disease   . RENAL INSUFFICIENCY 09/21/2006  . Shingles   . Small bowel obstruction   . Somatization disorder 04/25/2007  . UNSPECIFIED URINARY CALCULUS 05/28/2008    Patient Active Problem List   Diagnosis Date Noted  . Other nonspecific abnormal finding of lung field 08/14/2015  . Hyponatremia 07/21/2014  . Menopausal state 07/18/2014  . Hematest positive stools 08/05/2013  . Anemia due to other cause 08/05/2013  . Cerumen impaction  06/03/2013  . History of adrenal disorder 10/15/2012  . Rash and nonspecific skin eruption 10/15/2012  . Iron deficiency anemia 06/20/2012  . Psychogenic polydipsia 06/20/2012  . Nonspecific abnormal electrocardiogram (ECG) (EKG) 06/20/2012  . Polyuria 05/02/2012  . Pelvic pain in female 08/16/2011  . Leg ulcer ([place]) 06/16/2010  . Routine general medical examination at a health care facility 06/11/2010  . Encounter for long-term (current) use of other medications 06/11/2010  . HYPERCHOLESTEROLEMIA 06/05/2009  . UNSPECIFIED URINARY CALCULUS 05/28/2008  . CHEST PAIN, ATYPICAL 05/28/2008  . ABDOMINAL PAIN, CHRONIC 05/28/2008  . ASYMPTOMATIC POSTMENOPAUSAL STATUS 05/28/2008  . SOMATIZATION DISORDER 04/25/2007  . Hypothyroidism 09/21/2006  . Essential hypertension 09/21/2006  . ALLERGIC RHINITIS 09/21/2006  . ASTHMA 09/21/2006  . GERD 09/21/2006  . Disorder resulting from impaired renal function 09/21/2006  . DIVERTICULITIS, HX OF 09/21/2006    Past Surgical History:  Procedure Laterality Date  . ABDOMINAL HYSTERECTOMY  1981  . acute Nephritis  1958  . APPENDECTOMY  1959  . Asthma & Pneumonia Birth  1970's  . CESAREAN SECTION    . CHOLECYSTECTOMY  1983  . COLON SURGERY    . DENTAL SURGERY  1970  . NASAL SINUS SURGERY  1967  . Pulmonary thrombosis    . TONSILLECTOMY    . TUBAL LIGATION      OB History    No data available       Home Medications    Prior to Admission medications   Medication Sig Start Date End Date Taking? Authorizing Provider  cephALEXin (KEFLEX) 500 MG capsule Take  1 capsule (500 mg total) by mouth 2 (two) times daily. 09/14/15   Jaynee Eagles, PA-C  cyanocobalamin 500 MCG tablet Take 500 mcg by mouth daily.    Historical Provider, MD  Ferrous Sulfate (IRON) 325 (65 FE) MG TABS Take 1 tablet by mouth as needed.    Historical Provider, MD  fluticasone (FLOVENT HFA) 110 MCG/ACT inhaler Inhale 2 puffs into the lungs 2 (two) times daily. 11/25/15   Renato Shin, MD  levothyroxine (SYNTHROID, LEVOTHROID) 75 MCG tablet TAKE 1 TABLET (75 MCG TOTAL) BY MOUTH DAILY BEFORE BREAKFAST.(WILL PAY 9/25) 09/13/15   Renato Shin, MD  magnesium 30 MG tablet Take 30 mg by mouth 2 (two) times daily.    Historical Provider, MD  meclizine (ANTIVERT) 25 MG tablet Take 1 tablet (25 mg total) by mouth 3 (three) times daily as needed for dizziness. 10/01/15   Deno Etienne, DO  Multiple Vitamin (MULTIVITAMIN) tablet Take 1 tablet by mouth daily.      Historical Provider, MD  mupirocin nasal ointment (BACTROBAN) 2 % Apply in each nostril daily 09/27/15   Robyn Haber, MD  valACYclovir (VALTREX) 500 MG tablet Take 1 tablet (500 mg total) by mouth 2 (two) times daily. 09/27/15   Robyn Haber, MD    Family History Family History  Problem Relation Age of Onset  . Cancer Mother   . Cancer Father     Social History Social History  Substance Use Topics  . Smoking status: Never Smoker  . Smokeless tobacco: Never Used  . Alcohol use No     Allergies   Metronidazole; Macrobid [nitrofurantoin]; and Pepcid [famotidine]   Review of Systems Review of Systems  Constitutional: Negative for fever.  Cardiovascular: Positive for chest pain.  Gastrointestinal: Positive for abdominal pain. Negative for diarrhea.  All other systems reviewed and are negative.    Physical Exam Updated Vital Signs BP 151/79 (BP Location: Right Arm)   Pulse 87   Temp 98.5 F (36.9 C)   Resp 22   Ht 5' 5"  (1.651 m)   Wt 131 lb (59.4 kg)   SpO2 95%   BMI 21.80 kg/m   Physical Exam  Constitutional: She is oriented to person, place, and time. She appears well-developed and well-nourished. No distress.  HENT:  Head: Normocephalic and atraumatic.  Nose: Nose normal.  Mouth/Throat: Oropharynx is clear and moist. No oropharyngeal exudate.  Eyes: Conjunctivae and EOM are normal. Pupils are equal, round, and reactive to light. No scleral icterus.  Neck: Normal range of motion. Neck  supple. No JVD present. No tracheal deviation present. No thyromegaly present.  Cardiovascular: Normal rate, regular rhythm and normal heart sounds.  Exam reveals no gallop and no friction rub.   No murmur heard. Pulmonary/Chest: Effort normal and breath sounds normal. No respiratory distress. She has no wheezes. She exhibits no tenderness.  Abdominal: Soft. Bowel sounds are normal. She exhibits no distension and no mass. There is tenderness. There is no rebound and no guarding.  Upper abdominal tenderness, worst in epigastrium.  Musculoskeletal: Normal range of motion. She exhibits no edema or tenderness.  Lymphadenopathy:    She has no cervical adenopathy.  Neurological: She is alert and oriented to person, place, and time. No cranial nerve deficit. She exhibits normal muscle tone.  Skin: Skin is warm and dry. No rash noted. No erythema. No pallor.  Nursing note and vitals reviewed.    ED Treatments / Results  DIAGNOSTIC STUDIES: Oxygen Saturation is 95% on RA, adequate by  my interpretation.    COORDINATION OF CARE: 8:09 PM Discussed treatment plan with pt at bedside which includes abdomen CT and pt agreed to plan.  Labs (all labs ordered are listed, but only abnormal results are displayed) Labs Reviewed - No data to display  EKG  EKG Interpretation None       Radiology No results found.  Procedures Procedures (including critical care time)  Medications Ordered in ED Medications - No data to display   Initial Impression / Assessment and Plan / ED Course  I have reviewed the triage vital signs and the nursing notes.  Pertinent labs & imaging results that were available during my care of the patient were reviewed by me and considered in my medical decision making (see chart for details).  Clinical Course    81yF with epigastric/CP. Likely gastritis. Unfortunately shereports intolerances of both PPIs and H2 blockers. Diet considerations were discussed. It has been  determined that no acute conditions requiring further emergency intervention are present at this time. The patient has been advised of the diagnosis and plan. I reviewed any labs and imaging including any potential incidental findings. We have discussed signs and symptoms that warrant return to the ED and they are listed in the discharge instructions.    Final Clinical Impressions(s) / ED Diagnoses   Final diagnoses:  Acute gastritis without hemorrhage, unspecified gastritis type    New Prescriptions New Prescriptions   No medications on file   I personally preformed the services scribed in my presence. The recorded information has been reviewed is accurate. Virgel Manifold, MD.     Virgel Manifold, MD 12/02/15 [phone]

## 2016-04-26 DIAGNOSIS — Z961 Presence of intraocular lens: Secondary | ICD-10-CM | POA: Diagnosis not present

## 2016-04-26 DIAGNOSIS — H04123 Dry eye syndrome of bilateral lacrimal glands: Secondary | ICD-10-CM | POA: Diagnosis not present

## 2016-04-26 DIAGNOSIS — H10413 Chronic giant papillary conjunctivitis, bilateral: Secondary | ICD-10-CM | POA: Diagnosis not present

## 2016-04-26 DIAGNOSIS — H26492 Other secondary cataract, left eye: Secondary | ICD-10-CM | POA: Diagnosis not present

## 2016-04-26 DIAGNOSIS — H353131 Nonexudative age-related macular degeneration, bilateral, early dry stage: Secondary | ICD-10-CM | POA: Diagnosis not present

## 2016-05-26 DIAGNOSIS — L814 Other melanin hyperpigmentation: Secondary | ICD-10-CM | POA: Diagnosis not present

## 2016-05-26 DIAGNOSIS — D1801 Hemangioma of skin and subcutaneous tissue: Secondary | ICD-10-CM | POA: Diagnosis not present

## 2016-05-26 DIAGNOSIS — L728 Other follicular cysts of the skin and subcutaneous tissue: Secondary | ICD-10-CM | POA: Diagnosis not present

## 2016-05-26 DIAGNOSIS — L821 Other seborrheic keratosis: Secondary | ICD-10-CM | POA: Diagnosis not present

## 2016-06-13 ENCOUNTER — Other Ambulatory Visit: Payer: Self-pay | Admitting: Endocrinology

## 2016-07-09 ENCOUNTER — Other Ambulatory Visit: Payer: Self-pay | Admitting: Endocrinology

## 2016-08-15 ENCOUNTER — Encounter: Payer: Self-pay | Admitting: Endocrinology

## 2016-08-15 ENCOUNTER — Ambulatory Visit (INDEPENDENT_AMBULATORY_CARE_PROVIDER_SITE_OTHER): Payer: Medicare Other | Admitting: Endocrinology

## 2016-08-15 VITALS — BP 132/82 | HR 87 | Ht 65.0 in | Wt 135.0 lb

## 2016-08-15 DIAGNOSIS — N951 Menopausal and female climacteric states: Secondary | ICD-10-CM

## 2016-08-15 DIAGNOSIS — E871 Hypo-osmolality and hyponatremia: Secondary | ICD-10-CM | POA: Diagnosis not present

## 2016-08-15 DIAGNOSIS — Z23 Encounter for immunization: Secondary | ICD-10-CM | POA: Diagnosis not present

## 2016-08-15 DIAGNOSIS — I10 Essential (primary) hypertension: Secondary | ICD-10-CM | POA: Diagnosis not present

## 2016-08-15 DIAGNOSIS — E039 Hypothyroidism, unspecified: Secondary | ICD-10-CM | POA: Diagnosis not present

## 2016-08-15 DIAGNOSIS — N259 Disorder resulting from impaired renal tubular function, unspecified: Secondary | ICD-10-CM | POA: Diagnosis not present

## 2016-08-15 DIAGNOSIS — D509 Iron deficiency anemia, unspecified: Secondary | ICD-10-CM | POA: Diagnosis not present

## 2016-08-15 DIAGNOSIS — E7800 Pure hypercholesterolemia, unspecified: Secondary | ICD-10-CM | POA: Diagnosis not present

## 2016-08-15 DIAGNOSIS — Z0000 Encounter for general adult medical examination without abnormal findings: Secondary | ICD-10-CM | POA: Diagnosis not present

## 2016-08-15 DIAGNOSIS — Z780 Asymptomatic menopausal state: Secondary | ICD-10-CM

## 2016-08-15 LAB — URINALYSIS, ROUTINE W REFLEX MICROSCOPIC
Bilirubin Urine: NEGATIVE
Hgb urine dipstick: NEGATIVE
KETONES UR: NEGATIVE
LEUKOCYTES UA: NEGATIVE
Nitrite: NEGATIVE
PH: 7 (ref 5.0–8.0)
RBC / HPF: NONE SEEN (ref 0–?)
TOTAL PROTEIN, URINE-UPE24: NEGATIVE
UROBILINOGEN UA: 0.2 (ref 0.0–1.0)
Urine Glucose: NEGATIVE
WBC, UA: NONE SEEN (ref 0–?)

## 2016-08-15 LAB — CBC WITH DIFFERENTIAL/PLATELET
BASOS ABS: 0.1 10*3/uL (ref 0.0–0.1)
BASOS PCT: 1 % (ref 0.0–3.0)
EOS ABS: 0.4 10*3/uL (ref 0.0–0.7)
Eosinophils Relative: 5.4 % — ABNORMAL HIGH (ref 0.0–5.0)
HEMATOCRIT: 36.6 % (ref 36.0–46.0)
Hemoglobin: 12.3 g/dL (ref 12.0–15.0)
LYMPHS ABS: 1.9 10*3/uL (ref 0.7–4.0)
Lymphocytes Relative: 25.7 % (ref 12.0–46.0)
MCHC: 33.5 g/dL (ref 30.0–36.0)
MCV: 93.7 fl (ref 78.0–100.0)
MONO ABS: 0.8 10*3/uL (ref 0.1–1.0)
Monocytes Relative: 11.2 % (ref 3.0–12.0)
NEUTROS ABS: 4.3 10*3/uL (ref 1.4–7.7)
NEUTROS PCT: 56.7 % (ref 43.0–77.0)
PLATELETS: 335 10*3/uL (ref 150.0–400.0)
RBC: 3.9 Mil/uL (ref 3.87–5.11)
RDW: 13.1 % (ref 11.5–15.5)
WBC: 7.6 10*3/uL (ref 4.0–10.5)

## 2016-08-15 LAB — HEPATIC FUNCTION PANEL
ALBUMIN: 4.2 g/dL (ref 3.5–5.2)
ALT: 12 U/L (ref 0–35)
AST: 20 U/L (ref 0–37)
Alkaline Phosphatase: 39 U/L (ref 39–117)
BILIRUBIN TOTAL: 0.3 mg/dL (ref 0.2–1.2)
Bilirubin, Direct: 0.1 mg/dL (ref 0.0–0.3)
TOTAL PROTEIN: 7.2 g/dL (ref 6.0–8.3)

## 2016-08-15 LAB — BASIC METABOLIC PANEL
BUN: 11 mg/dL (ref 6–23)
CO2: 29 mEq/L (ref 19–32)
CREATININE: 0.61 mg/dL (ref 0.40–1.20)
Calcium: 10.3 mg/dL (ref 8.4–10.5)
Chloride: 97 mEq/L (ref 96–112)
GFR: 100.22 mL/min (ref >60.00)
Glucose, Bld: 98 mg/dL (ref 70–99)
POTASSIUM: 4.5 meq/L (ref 3.5–5.1)
Sodium: 131 mEq/L — ABNORMAL LOW (ref 135–145)

## 2016-08-15 LAB — LIPID PANEL
CHOL/HDL RATIO: 3
Cholesterol: 172 mg/dL (ref 0–200)
HDL: 66.4 mg/dL (ref >39.00)
LDL CALC: 82 mg/dL (ref 0–99)
NONHDL: 105.81
TRIGLYCERIDES: 118 mg/dL (ref 0.0–149.0)
VLDL: 23.6 mg/dL (ref 0.0–40.0)

## 2016-08-15 LAB — IBC PANEL
IRON: 64 ug/dL (ref 42–145)
Saturation Ratios: 21.2 % (ref 20.0–50.0)
TRANSFERRIN: 216 mg/dL (ref 212.0–360.0)

## 2016-08-15 LAB — TSH: TSH: 0.2 u[IU]/mL — ABNORMAL LOW (ref 0.35–4.50)

## 2016-08-15 MED ORDER — LEVOTHYROXINE SODIUM 50 MCG PO TABS: 50.0000 ug | ORAL_TABLET | Freq: Every day | ORAL | 11 refills | Status: DC

## 2016-08-15 NOTE — Progress Notes (Signed)
Subjective:    Patient ID: [name], female    DOB: [birth year], 82 y.o.   MRN: [id number]  HPI Pt is here for regular wellness examination, and is feeling pretty well in general, and says chronic med probs are stable, except as noted below Past Medical History:  Diagnosis Date  . ABDOMINAL PAIN, CHRONIC 05/28/2008  . ALLERGIC RHINITIS 09/21/2006  . ANEMIA-NOS 09/21/2006  . ASTHMA 09/21/2006  . ASYMPTOMATIC POSTMENOPAUSAL STATUS 05/28/2008  . Cataract   . CHEST PAIN, ATYPICAL 05/28/2008  . COPD (chronic obstructive pulmonary disease) ([place])   . DDD (degenerative disc disease)   . DIVERTICULITIS, HX OF 09/21/2006  . GERD 09/21/2006  . HYPERCHOLESTEROLEMIA 06/05/2009  . HYPERTENSION 09/21/2006  . HYPOTHYROIDISM 09/21/2006  . Peptic ulcer disease   . RENAL INSUFFICIENCY 09/21/2006  . Shingles   . Small bowel obstruction ([place])   . Somatization disorder 04/25/2007  . UNSPECIFIED URINARY CALCULUS 05/28/2008    Past Surgical History:  Procedure Laterality Date  . ABDOMINAL HYSTERECTOMY  1981  . acute Nephritis  1958  . APPENDECTOMY  1959  . Asthma & Pneumonia Birth  1970's  . CESAREAN SECTION    . CHOLECYSTECTOMY  1983  . COLON SURGERY    . DENTAL SURGERY  1970  . NASAL SINUS SURGERY  1967  . Pulmonary thrombosis    . TONSILLECTOMY    . TUBAL LIGATION      Social History   Social History  . Marital status: Divorced    Spouse name: N/A  . Number of children: N/A  . Years of education: N/A   Occupational History  . Not on file.   Social History Main Topics  . Smoking status: Never Smoker  . Smokeless tobacco: Never Used  . Alcohol use No  . Drug use: No  . Sexual activity: No   Other Topics Concern  . Not on file   Social History Narrative  . No narrative on file    Current Outpatient Prescriptions on File Prior to Visit  Medication Sig Dispense Refill  . Ferrous Sulfate (IRON) 325 (65 FE) MG TABS Take 1 tablet by mouth daily.     Marland Kitchen FLOVENT HFA 110 MCG/ACT  inhaler INHALE 2 PUFFS TWICE A DAY 12 Inhaler 4   No current facility-administered medications on file prior to visit.     Allergies  Allergen Reactions  . Metronidazole Shortness Of Breath and Nausea And Vomiting  . Macrobid [Nitrofurantoin]     REACTION: Syncope  . Pepcid [Famotidine]     Family History  Problem Relation Age of Onset  . Cancer Mother   . Cancer Father     BP 132/82   Pulse 87   Ht 5' 5"  (1.651 m)   Wt 135 lb (61.2 kg)   SpO2 95%   BMI 22.47 kg/m     Review of Systems  Constitutional: Positive for fever.  HENT: Negative for trouble swallowing.   Eyes: Negative for photophobia.  Respiratory: Negative for shortness of breath.   Cardiovascular: Negative for chest pain.  Gastrointestinal: Negative for blood in stool.  Endocrine: Negative for cold intolerance.  Genitourinary: Negative for hematuria.  Musculoskeletal:       No change in chronic arthralgias  Skin: Negative for rash.  Allergic/Immunologic: Positive for environmental allergies.  Neurological: Negative for syncope and numbness.  Hematological: Negative for adenopathy.  Psychiatric/Behavioral: Negative for agitation.       Objective:   Physical Exam VS: see vs page GEN:  no distress HEAD: head: no deformity eyes: no periorbital swelling, no proptosis external nose and ears are normal mouth: no lesion seen NECK: supple, thyroid is not enlarged CHEST WALL: no deformity, except for kyphosis LUNGS: clear to auscultation CV: reg rate and rhythm, no murmur ABD: abdomen is soft, nontender.  no hepatosplenomegaly.  not distended.  no hernia MUSCULOSKELETAL: muscle bulk and strength are grossly normal.  no obvious joint swelling.  gait is steady with a cane.   EXTEMITIES: no deformity.  no ulcer on the feet.  feet are of normal color and temp.  Trace bilat leg edema, and bilat vv's.  There is bilateral onychomycosis of the toenails.   PULSES: dorsalis pedis intact bilat.  no carotid  bruit NEURO:  cn 2-12 grossly intact.   readily moves all 4's.  sensation is intact to touch on the feet SKIN:  Normal texture and temperature.  No rash or suspicious lesion is visible.  Ecchymoses are noted on the arms and legs NODES:  None palpable at the neck PSYCH: alert, well-oriented.  Does not appear anxious nor depressed.      Assessment & Plan:  Wellness visit today, with problems stable, except as noted.   Subjective:   Patient here for Medicare annual wellness visit and management of other chronic and acute problems.     Risk factors: advanced age    80 of Physicians Providing Medical Care to Patient:  See "snapshot"   Activities of Daily Living: In your present state of health, do you have any difficulty performing the following activities (lives alone)?:  Preparing food and eating?: No  Bathing yourself: No  Getting dressed: No  Using the toilet:No  Moving around from place to place: No  In the past year have you fallen or had a near fall?: No    Home Safety: Has smoke detector and wears seat belts. No firearms. No excess sun exposure.   Diet and Exercise  Current exercise habits: good, as limited by health probs Dietary issues discussed: pt diet is limited by gastroparesis.    Depression Screen  Q1: Over the past two weeks, have you felt down, depressed or hopeless?no  Q2: Over the past two weeks, have you felt little interest or pleasure in doing things? no   The following portions of the patient's history were reviewed and updated as appropriate: allergies, current medications, past family history, past medical history, past social history, past surgical history and problem list.   Review of Systems  Denies hearing loss, and visual loss.   Objective:   Vision:  Advertising account executive, so she declines VA today Hearing: grossly normal Body mass index:  See vs page Msk: pt easily but slowly performs "get-up-and-go" from a sitting position.  Cognitive  Impairment Assessment: cognition, memory and judgment appear normal.  remembers 3/3 at 5 minutes.  excellent recall.  can easily read and write a sentence.  alert and oriented x 3   Assessment:   Medicare wellness utd on preventive parameters    Plan:   During the course of the visit the patient was educated and counseled about appropriate screening and preventive services including:        Fall prevention is offered   Screening mammography is UTD Bone densitometry screening is requested Diabetes screening is done today Nutrition counseling is offfered  Vaccines are updated as needed  Patient Instructions (the written plan) was given to the patient.

## 2016-08-15 NOTE — Progress Notes (Signed)
we discussed code status.  pt requests full code, but would not want to be started or maintained on artificial life-support measures if there was not a reasonable chance of recovery 

## 2016-08-15 NOTE — Patient Instructions (Addendum)
Please consider these measures for your health:  minimize alcohol.  Do not use tobacco products.  Have a colonoscopy at least every 10 years from age 80.  Women should have an annual mammogram from age 63.  Keep firearms safely stored.  Always use seat belts.  have working smoke alarms in your home.  See an eye doctor and dentist regularly.  Never drive under the influence of alcohol or drugs (including prescription drugs).  Those with fair skin should take precautions against the sun, and should carefully examine their skin once per month, for any new or changed moles. It is critically important to prevent falling down (keep floor areas well-lit, dry, and free of loose objects.  If you have a cane, walker, or wheelchair, you should use it, even for short trips around the house.  Wear flat-soled shoes.  Also, try not to rush) good diet and exercise significantly improve your health.  please let me know if you wish to be referred to a dietician.  high blood sugar is very risky to your health.  you should see an eye doctor and dentist every year.  It is very important to get all recommended vaccinations.  blood tests are requested for you today.  We'll let you know about the results. Please return in 1 year.

## 2016-08-22 ENCOUNTER — Inpatient Hospital Stay: Admission: RE | Admit: 2016-08-22 | Payer: Self-pay | Source: Ambulatory Visit

## 2016-08-25 ENCOUNTER — Ambulatory Visit (INDEPENDENT_AMBULATORY_CARE_PROVIDER_SITE_OTHER)
Admission: RE | Admit: 2016-08-25 | Discharge: 2016-08-25 | Disposition: A | Discharge status: Home / Self Care | Payer: Medicare Other | Source: Ambulatory Visit | Attending: Endocrinology | Admitting: Endocrinology

## 2016-08-25 DIAGNOSIS — N951 Menopausal and female climacteric states: Secondary | ICD-10-CM

## 2016-08-25 DIAGNOSIS — Z780 Asymptomatic menopausal state: Secondary | ICD-10-CM | POA: Diagnosis not present

## 2016-09-02 ENCOUNTER — Telehealth: Payer: Self-pay

## 2016-09-02 NOTE — Telephone Encounter (Signed)
-----   Message from Renato Shin, MD sent at 08/31/2016  6:25 PM EDT ----- please call patient: Osteoporosis is found. I would be happy to send a prescription for you, to take just one pill a month. Please let us know.

## 2016-09-02 NOTE — Telephone Encounter (Signed)
LVM, gave lab results. Gave call back number if any questions or concerns.

## 2016-09-06 DIAGNOSIS — Z780 Asymptomatic menopausal state: Secondary | ICD-10-CM | POA: Insufficient documentation

## 2016-09-06 MED ORDER — IBANDRONATE SODIUM 150 MG PO TABS: 150.0000 mg | ORAL_TABLET | ORAL | 3 refills | Status: DC

## 2016-09-06 NOTE — Telephone Encounter (Signed)
Ok, I have sent a prescription to your pharmacy

## 2016-09-06 NOTE — Telephone Encounter (Signed)
Patient would like to follow through with the osteoporosis medication. Please send [place]  [id number]Phone) [phone](Fax)   No further questions, please call patient once rx has been sent.

## 2016-09-06 NOTE — Addendum Note (Signed)
Addended by: Renato Shin on: 09/06/2016 10:04 AM   Modules accepted: Orders

## 2016-09-06 NOTE — Telephone Encounter (Signed)
Notified patient that prescription was sent in.

## 2016-09-06 NOTE — Telephone Encounter (Signed)
This is an [facility] patient.  Thanks!

## 2016-09-20 ENCOUNTER — Telehealth: Payer: Self-pay | Admitting: Endocrinology

## 2016-09-20 ENCOUNTER — Other Ambulatory Visit (INDEPENDENT_AMBULATORY_CARE_PROVIDER_SITE_OTHER): Payer: Medicare Other

## 2016-09-20 ENCOUNTER — Other Ambulatory Visit: Payer: Self-pay

## 2016-09-20 DIAGNOSIS — E039 Hypothyroidism, unspecified: Secondary | ICD-10-CM | POA: Diagnosis not present

## 2016-09-20 DIAGNOSIS — E871 Hypo-osmolality and hyponatremia: Secondary | ICD-10-CM

## 2016-09-20 LAB — BASIC METABOLIC PANEL
BUN: 14 mg/dL (ref 6–23)
CHLORIDE: 99 meq/L (ref 96–112)
CO2: 28 mEq/L (ref 19–32)
Calcium: 9.7 mg/dL (ref 8.4–10.5)
Creatinine, Ser: 0.61 mg/dL (ref 0.40–1.20)
GFR: 100.19 mL/min (ref >60.00)
Glucose, Bld: 89 mg/dL (ref 70–99)
Potassium: 4.1 mEq/L (ref 3.5–5.1)
SODIUM: 133 meq/L — AB (ref 135–145)

## 2016-09-20 LAB — TSH: TSH: 5.94 u[IU]/mL — AB (ref 0.35–4.50)

## 2016-09-20 MED ORDER — IBANDRONATE SODIUM 150 MG PO TABS: 150.0000 mg | ORAL_TABLET | ORAL | 3 refills | Status: DC

## 2016-09-20 NOTE — Telephone Encounter (Signed)
boniva has not been received by the pharmacy yet according to the conversation the pt had with [facility]. Can we try to resend, please?

## 2016-09-20 NOTE — Telephone Encounter (Signed)
Called and left patient a VM that prescription was [facility].

## 2016-09-25 IMAGING — CT CT HEAD W/O CM
3 series · 16 of 47 positions shown, 19 images · non-contrast
Comparison: 03/10/2006

CLINICAL DATA: Headache

EXAM:
CT HEAD WITHOUT CONTRAST
TECHNIQUE: Contiguous axial images were obtained from the base of the skull
through the vertex without intravenous contrast.

[Series 2: head 5.0 h30s · axial · 0.42mm/px · z∈[+1330,+1460]mm · 10 of 32 slices shown, 13 images]
[im 3/32  brain]
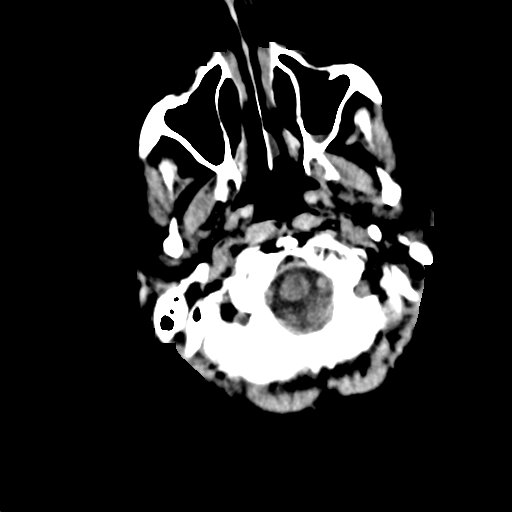
[im 3/32  bone]
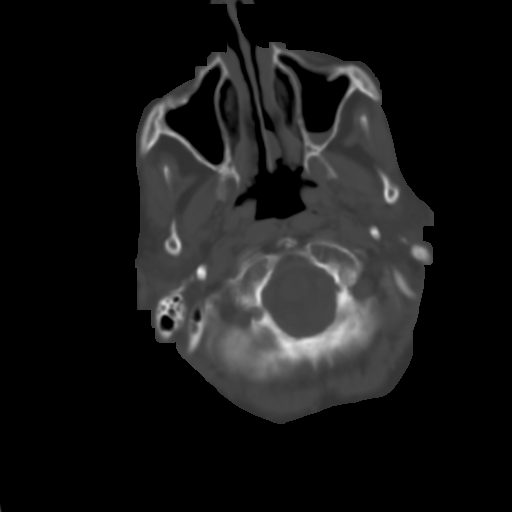
[im 6/32  brain]
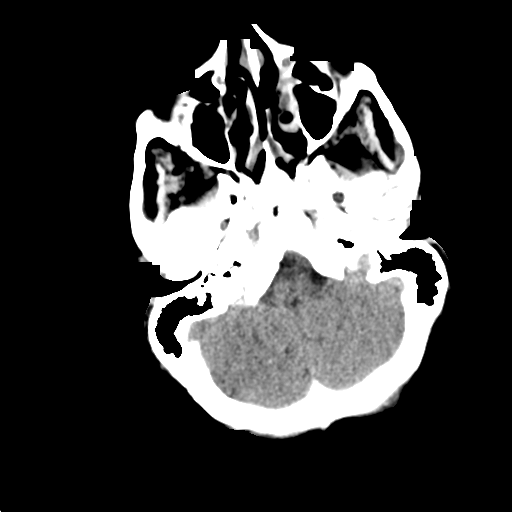
[im 9/32  brain]
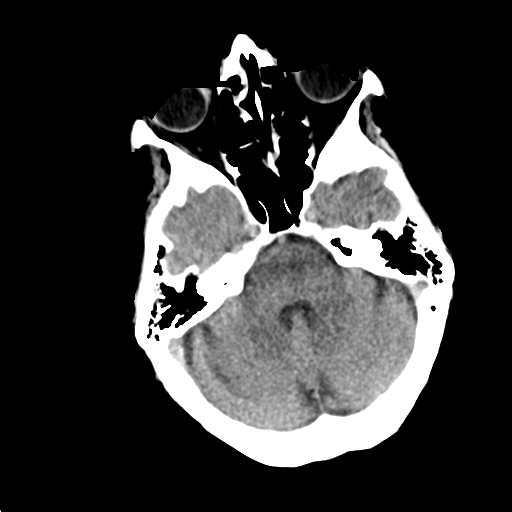
[im 11/32  brain]
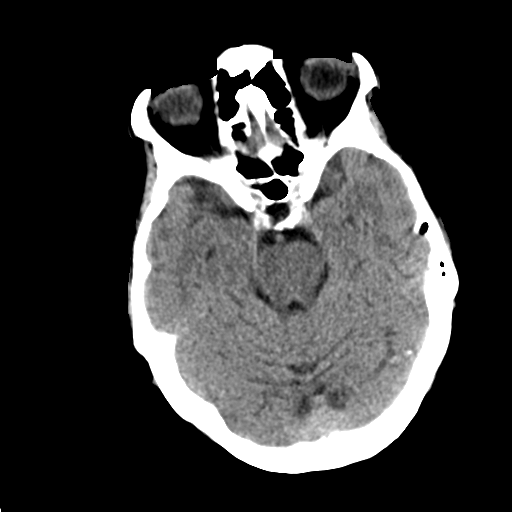
[im 14/32  brain]
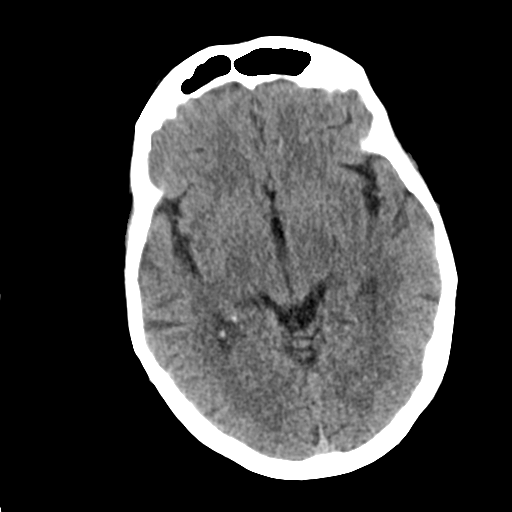
[im 14/32  bone]
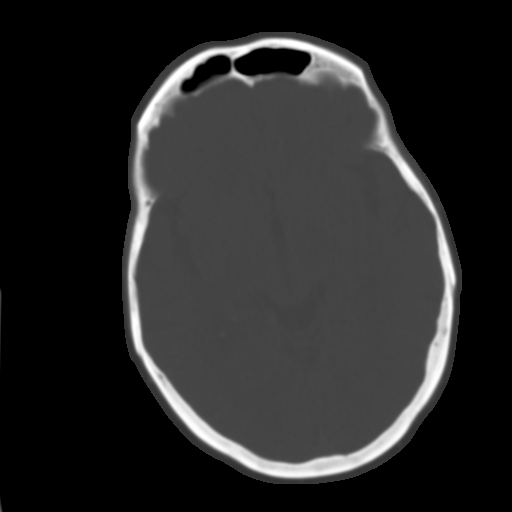
[im 18/32  brain]
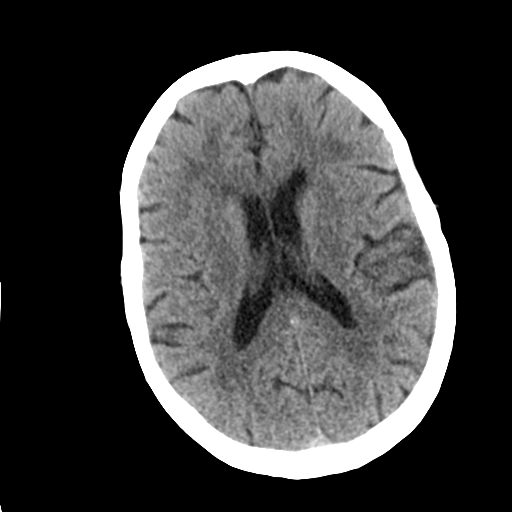
[im 21/32  brain]
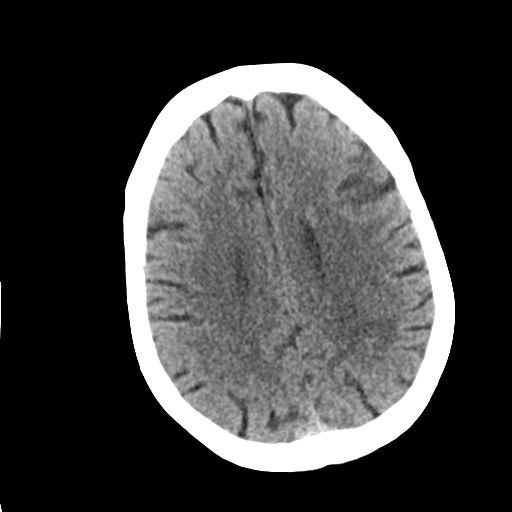
[im 24/32  brain]
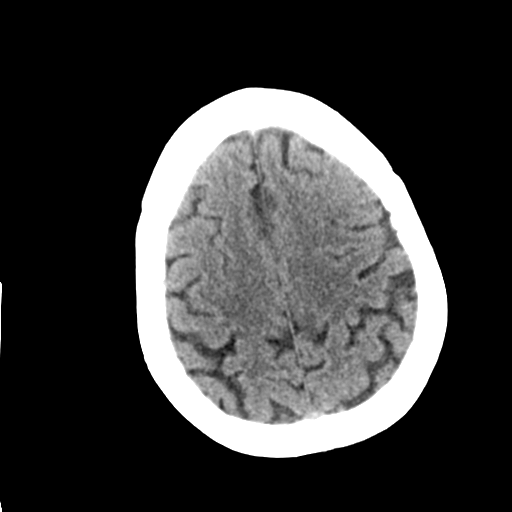
[im 26/32  brain]
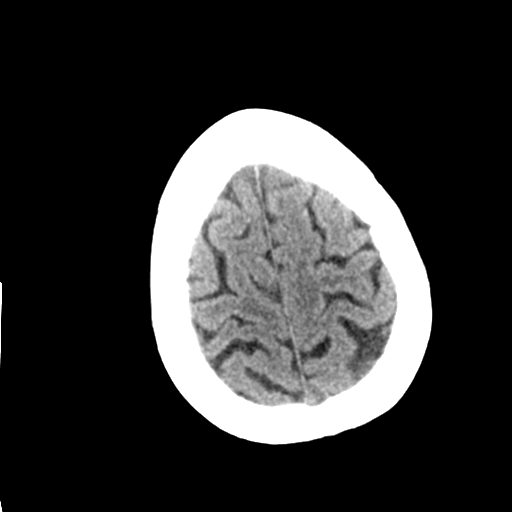
[im 26/32  bone]
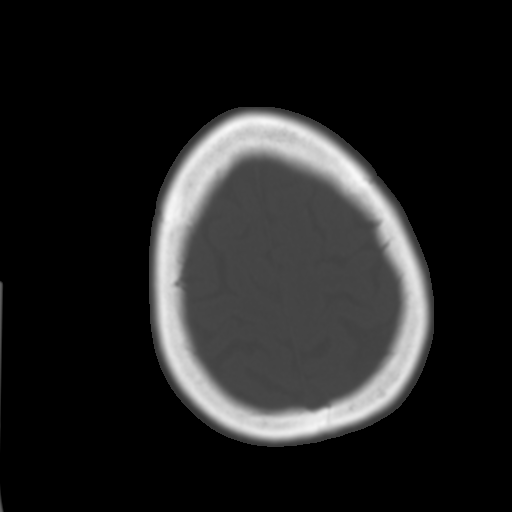
[im 29/32  brain]
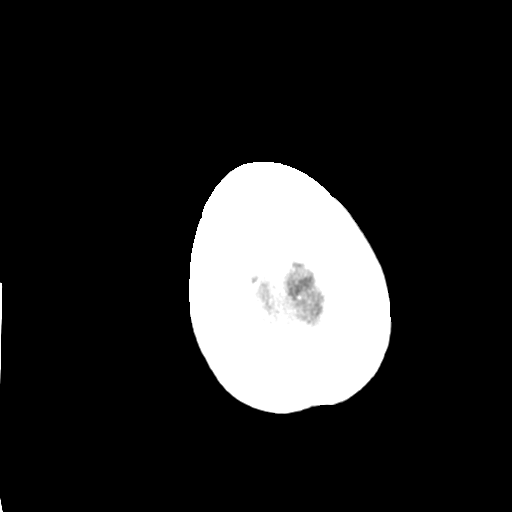

[Series 4: head 3.0 mpr · coronal · 0.32mm/px · 3 of 71 slices shown (1 of 2)]
[im 24/71  brain]
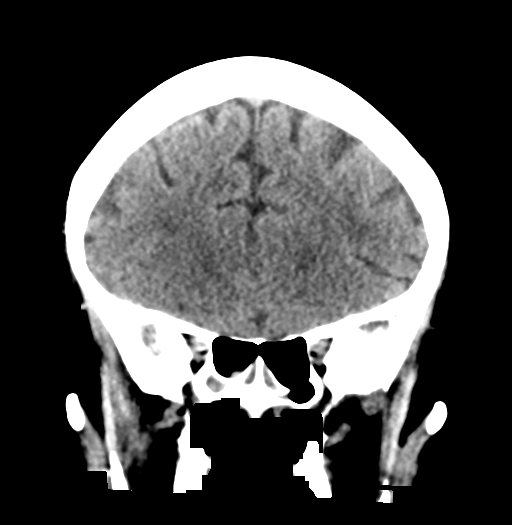
[im 32/71  brain]
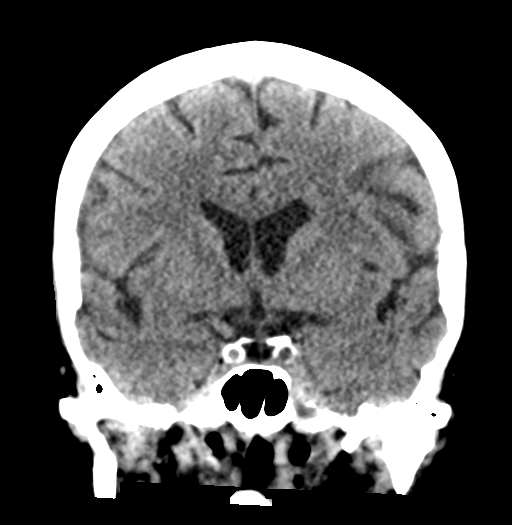
[im 39/71  brain]
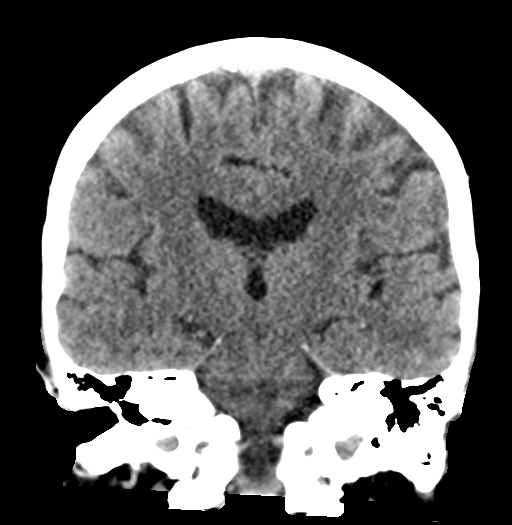

[Series 5: head 3.0 mpr · sagittal · 0.32mm/px · 3 of 60 slices shown (2 of 2)]
[im 20/60  brain]
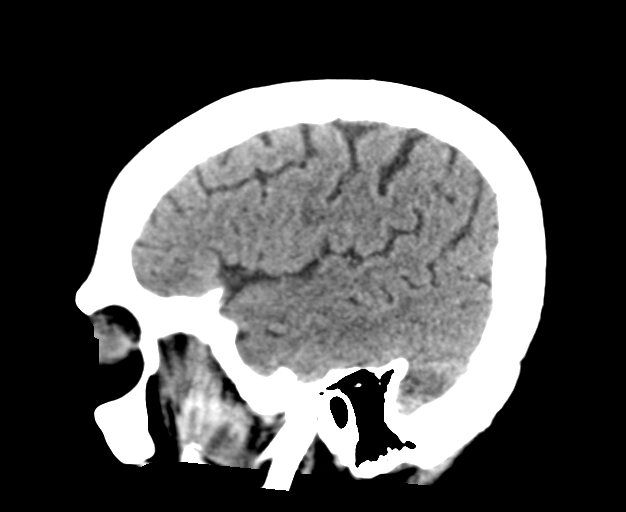
[im 30/60  brain]
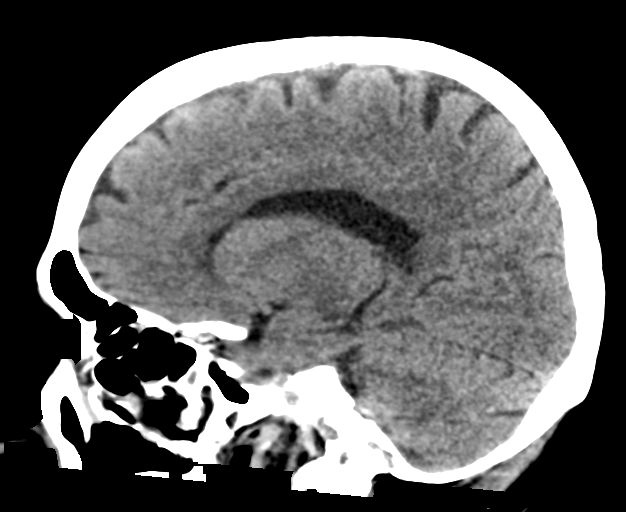
[im 40/60  brain]
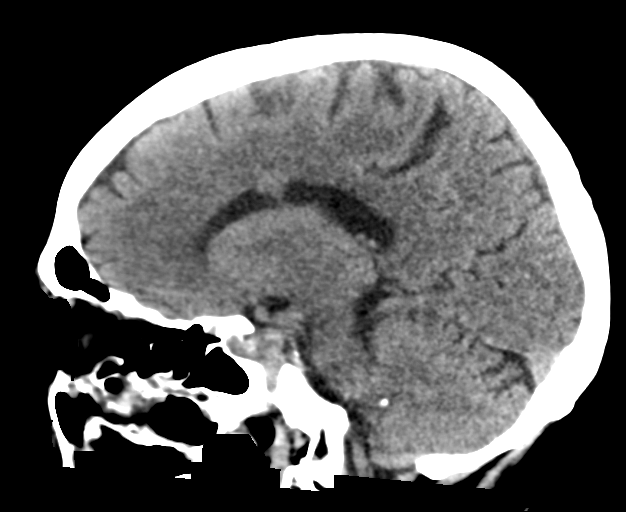

[16 of 47 positions shown; findings below may reference images not displayed]

FINDINGS: Brain: No evidence of acute infarction, hemorrhage, hydrocephalus,
extra-axial collection or mass lesion/mass effect.

Vascular: No hyperdense vessel or unexpected calcification.

Skull: No evidence of calvarial fracture.

Sinuses/Orbits: Partial opacification of the bilateral ethmoid and
left maxillary sinuses. Mild mucosal thickening of the right
maxillary sinus. Bilateral mastoid air cells are clear.

Other: Cerebral volume is within normal limits. No ventriculomegaly.

Subcortical white matter and periventricular small vessel ischemic
changes. Mild intracranial atherosclerosis.
IMPRESSION: No evidence of acute intracranial abnormality.

Small vessel ischemic changes.

## 2016-11-21 ENCOUNTER — Encounter: Payer: Self-pay | Admitting: Endocrinology

## 2016-11-21 ENCOUNTER — Ambulatory Visit (INDEPENDENT_AMBULATORY_CARE_PROVIDER_SITE_OTHER): Payer: Medicare Other | Admitting: Endocrinology

## 2016-11-21 VITALS — BP 142/68 | HR 85 | Wt 139.2 lb

## 2016-11-21 DIAGNOSIS — M810 Age-related osteoporosis without current pathological fracture: Secondary | ICD-10-CM | POA: Diagnosis not present

## 2016-11-21 DIAGNOSIS — E039 Hypothyroidism, unspecified: Secondary | ICD-10-CM | POA: Diagnosis not present

## 2016-11-21 DIAGNOSIS — E871 Hypo-osmolality and hyponatremia: Secondary | ICD-10-CM

## 2016-11-21 LAB — BASIC METABOLIC PANEL
BUN: 15 mg/dL (ref 6–23)
CALCIUM: 9.9 mg/dL (ref 8.4–10.5)
CO2: 29 mEq/L (ref 19–32)
Chloride: 96 mEq/L (ref 96–112)
Creatinine, Ser: 0.63 mg/dL (ref 0.40–1.20)
GFR: 96.49 mL/min (ref >60.00)
GLUCOSE: 108 mg/dL — AB (ref 70–99)
Potassium: 4 mEq/L (ref 3.5–5.1)
SODIUM: 132 meq/L — AB (ref 135–145)

## 2016-11-21 LAB — URINALYSIS, ROUTINE W REFLEX MICROSCOPIC
BILIRUBIN URINE: NEGATIVE
Hgb urine dipstick: NEGATIVE
Ketones, ur: NEGATIVE
Nitrite: NEGATIVE
PH: 6.5 (ref 5.0–8.0)
RBC / HPF: NONE SEEN (ref 0–?)
Specific Gravity, Urine: 1.01 (ref 1.000–1.030)
TOTAL PROTEIN, URINE-UPE24: NEGATIVE
URINE GLUCOSE: NEGATIVE
UROBILINOGEN UA: 0.2 (ref 0.0–1.0)

## 2016-11-21 LAB — TSH: TSH: 7.27 u[IU]/mL — AB (ref 0.35–4.50)

## 2016-11-21 MED ORDER — LEVOTHYROXINE SODIUM 75 MCG PO TABS: 75.0000 ug | ORAL_TABLET | Freq: Every day | ORAL | 3 refills | Status: DC

## 2016-11-21 NOTE — Progress Notes (Signed)
Subjective:    Patient ID: [name], female    DOB: 01/27/1935, 82 y.o.   MRN: [id number]  HPI The state of at least three ongoing medical problems is addressed today, with interval history of each noted here: Hypothyroidism: she has ongoing fatigue.  This is a stable problem. Osteoporosis: she has not taken boniva, due to GERD sxs. This is a stable problem. Hyponatremia: she has mild nausea.  She requests to recheck UA.  This is a stable problem. Past Medical History:  Diagnosis Date  . ABDOMINAL PAIN, CHRONIC 05/28/2008  . ALLERGIC RHINITIS 09/21/2006  . ANEMIA-NOS 09/21/2006  . ASTHMA 09/21/2006  . ASYMPTOMATIC POSTMENOPAUSAL STATUS 05/28/2008  . Cataract   . CHEST PAIN, ATYPICAL 05/28/2008  . COPD (chronic obstructive pulmonary disease) ([place])   . DDD (degenerative disc disease)   . DIVERTICULITIS, HX OF 09/21/2006  . GERD 09/21/2006  . HYPERCHOLESTEROLEMIA 06/05/2009  . HYPERTENSION 09/21/2006  . HYPOTHYROIDISM 09/21/2006  . Peptic ulcer disease   . RENAL INSUFFICIENCY 09/21/2006  . Shingles   . Small bowel obstruction ([place])   . Somatization disorder 04/25/2007  . UNSPECIFIED URINARY CALCULUS 05/28/2008    Past Surgical History:  Procedure Laterality Date  . ABDOMINAL HYSTERECTOMY  1981  . acute Nephritis  1958  . APPENDECTOMY  1959  . Asthma & Pneumonia Birth  1970's  . CESAREAN SECTION    . CHOLECYSTECTOMY  1983  . COLON SURGERY    . DENTAL SURGERY  1970  . NASAL SINUS SURGERY  1967  . Pulmonary thrombosis    . TONSILLECTOMY    . TUBAL LIGATION      Social History   Social History  . Marital status: Divorced    Spouse name: N/A  . Number of children: N/A  . Years of education: N/A   Occupational History  . Not on file.   Social History Main Topics  . Smoking status: Never Smoker  . Smokeless tobacco: Never Used  . Alcohol use No  . Drug use: No  . Sexual activity: No   Other Topics Concern  . Not on file   Social History Narrative  . No narrative on  file    Current Outpatient Prescriptions on File Prior to Visit  Medication Sig Dispense Refill  . Ferrous Sulfate (IRON) 325 (65 FE) MG TABS Take 1 tablet by mouth daily.     Marland Kitchen FLOVENT HFA 110 MCG/ACT inhaler INHALE 2 PUFFS TWICE A DAY 12 Inhaler 4  . ibandronate (BONIVA) 150 MG tablet Take 1 tablet (150 mg total) by mouth every 30 (thirty) days. Take in the morning with a full glass of water, on an empty stomach, and do not take anything else by mouth or lie down for the next 30 min. 3 tablet 3   No current facility-administered medications on file prior to visit.     Allergies  Allergen Reactions  . Metronidazole Shortness Of Breath and Nausea And Vomiting  . Macrobid [Nitrofurantoin]     REACTION: Syncope  . Pepcid [Famotidine]     Family History  Problem Relation Age of Onset  . Cancer Mother   . Cancer Father     BP (!) 142/68   Pulse 85   Wt 139 lb 3.2 oz (63.1 kg)   SpO2 96%   BMI 23.16 kg/m    Review of Systems Denies headache, but she has dry skin.      Objective:   Physical Exam VITAL SIGNS:  See vs  page.  GENERAL: no distress. Gait: normal and steady.      Assessment & Plan:  Hypothyroidism: due for recheck Osteoporosis: therapy is limited by drug intolerance.   Hyponatremia: I advised pt to increase salt intake and/or decrease water.  She declines to do either.    Patient Instructions  blood tests are requested for you today.  We'll let you know about the results.   Please let us know if you want to take the once-a-year infusion for osteoporosis called "Reclast."  Please come back for a follow-up appointment in 3-4 months.

## 2016-11-21 NOTE — Patient Instructions (Addendum)
blood tests are requested for you today.  We'll let you know about the results.   Please let us know if you want to take the once-a-year infusion for osteoporosis called "Reclast."  Please come back for a follow-up appointment in 3-4 months.

## 2016-11-25 ENCOUNTER — Other Ambulatory Visit: Payer: Self-pay

## 2016-11-25 MED ORDER — FLUTICASONE PROPIONATE HFA 110 MCG/ACT IN AERO: 2.0000 | INHALATION_SPRAY | Freq: Two times a day (BID) | RESPIRATORY_TRACT | 4 refills | Status: DC

## 2017-01-19 ENCOUNTER — Ambulatory Visit (INDEPENDENT_AMBULATORY_CARE_PROVIDER_SITE_OTHER): Payer: Medicare Other | Admitting: Family Medicine

## 2017-01-19 ENCOUNTER — Encounter: Payer: Self-pay | Admitting: Family Medicine

## 2017-01-19 ENCOUNTER — Ambulatory Visit: Payer: Self-pay | Admitting: *Deleted

## 2017-01-19 ENCOUNTER — Other Ambulatory Visit: Payer: Self-pay

## 2017-01-19 VITALS — BP 140/74 | HR 89 | Temp 99.5°F | Resp 17 | Ht 65.0 in | Wt 140.4 lb

## 2017-01-19 DIAGNOSIS — N39498 Other specified urinary incontinence: Secondary | ICD-10-CM

## 2017-01-19 DIAGNOSIS — R399 Unspecified symptoms and signs involving the genitourinary system: Secondary | ICD-10-CM

## 2017-01-19 DIAGNOSIS — N23 Unspecified renal colic: Secondary | ICD-10-CM

## 2017-01-19 DIAGNOSIS — R3989 Other symptoms and signs involving the genitourinary system: Secondary | ICD-10-CM | POA: Diagnosis not present

## 2017-01-19 LAB — POCT URINALYSIS DIP (MANUAL ENTRY)
BILIRUBIN UA: NEGATIVE
BILIRUBIN UA: NEGATIVE mg/dL
Blood, UA: NEGATIVE
Glucose, UA: NEGATIVE mg/dL
Nitrite, UA: NEGATIVE
Protein Ur, POC: NEGATIVE mg/dL
Spec Grav, UA: 1.015 (ref 1.010–1.025)
Urobilinogen, UA: 0.2 E.U./dL
pH, UA: 7 (ref 5.0–8.0)

## 2017-01-19 NOTE — Patient Instructions (Addendum)
IF you received an x-ray today, you will receive an invoice from [facility]. Please contact [facility] at [phone] with questions or concerns regarding your invoice.   IF you received labwork today, you will receive an invoice from [place]. Please contact [facility] at [phone] with questions or concerns regarding your invoice.   Our billing staff will not be able to assist you with questions regarding bills from these companies.  You will be contacted with the lab results as soon as they are available. The fastest way to get your results is to activate your My Chart account. Instructions are located on the last page of this paperwork. If you have not heard from [place] regarding the results in 2 weeks, please contact this office.    Urinary Incontinence Urinary incontinence is the involuntary loss of urine from your bladder. What are the causes? There are many causes of urinary incontinence. They include:  Medicines.  Infections.  Prostatic enlargement, leading to overflow of urine from your bladder.  Surgery.  Neurological diseases.  Emotional factors.  What are the signs or symptoms? Urinary Incontinence can be divided into four types: 1. Urge incontinence. Urge incontinence is the involuntary loss of urine before you have the opportunity to go to the bathroom. There is a sudden urge to void but not enough time to reach a bathroom. 2. Stress incontinence. Stress incontinence is the sudden loss of urine with any activity that forces urine to pass. It is commonly caused by anatomical changes to the pelvis and sphincter areas of your body. 3. Overflow incontinence. Overflow incontinence is the loss of urine from an obstructed opening to your bladder. This results in a backup of urine and a resultant buildup of pressure within the bladder. When the pressure within the bladder exceeds the closing pressure of the sphincter, the urine overflows, which causes  incontinence, similar to water overflowing a dam. 4. Total incontinence. Total incontinence is the loss of urine as a result of the inability to store urine within your bladder.  How is this diagnosed? Evaluating the cause of incontinence may require:  A thorough and complete medical and obstetric history.  A complete physical exam.  Laboratory tests such as a urine culture and sensitivities.  When additional tests are indicated, they can include:  An ultrasound exam.  Kidney and bladder X-rays.  Cystoscopy. This is an exam of the bladder using a narrow scope.  Urodynamic testing to test the nerve function to the bladder and sphincter areas.  How is this treated? Treatment for urinary incontinence depends on the cause:  For urge incontinence caused by a bacterial infection, antibiotics will be prescribed. If the urge incontinence is related to medicines you take, your health care provider may have you change the medicine.  For stress incontinence, surgery to re-establish anatomical support to the bladder or sphincter, or both, will often correct the condition.  For overflow incontinence caused by an enlarged prostate, an operation to open the channel through the enlarged prostate will allow the flow of urine out of the bladder. In women with fibroids, a hysterectomy may be recommended.  For total incontinence, surgery on your urinary sphincter may help. An artificial urinary sphincter (an inflatable cuff placed around the urethra) may be required. In women who have developed a hole-like passage between their bladder and vagina (vesicovaginal fistula), surgery to close the fistula often is required.  Follow these instructions at home:  Normal daily hygiene and the use of pads or  adult diapers that are changed regularly will help prevent odors and skin damage.  Avoid caffeine. It can overstimulate your bladder.  Use the bathroom regularly. Try about every 2-3 hours to go to the  bathroom, even if you do not feel the need to do so. Take time to empty your bladder completely. After urinating, wait a minute. Then try to urinate again.  For causes involving nerve dysfunction, keep a log of the medicines you take and a journal of the times you go to the bathroom. Contact a health care provider if:  You experience worsening of pain instead of improvement in pain after your procedure.  Your incontinence becomes worse instead of better. Get help right away if:  You experience fever or shaking chills.  You are unable to pass your urine.  You have redness spreading into your groin or down into your thighs. This information is not intended to replace advice given to you by your health care provider. Make sure you discuss any questions you have with your health care provider. Document Released: 03/03/2004 Document Revised: 09/04/2015 Document Reviewed: 07/03/2012 Elsevier Interactive Patient Education  [employer].

## 2017-01-19 NOTE — Telephone Encounter (Signed)
Pt  Reports  Symptoms  Of urinary    Frequency  With  Some  Burning  On  Urination     Since      July    Symptoms   Worse  Over  Last  Several  Days.  She  Reports  As  Well  Some  Low  abd  Pain    Over bladder   .   She  Denies  Any  Fever .  She  Also  Feels  Somewhat weak    At  Times   And  She  Attributes  It  Maybe  To  Her  Urinary  Symptoms .  She   Reports   As   Well   Some   Problems  With  Chronic   Problems   With  Both  Legs  But  Denies  Any injury .  She  Is  Speaking in complete  sentances  And  Is  Alert  And  Oriented .  Appt made today  At  [facility]  She  States  Her  Son   Will  Bring  Her to the  Office .  Reason for Disposition . [1] Can't control passage of urine (i.e., urinary incontinence) AND [2] new onset (< 2 weeks) or worsening  Answer Assessment - Initial Assessment Questions 1. SYMPTOM: "What's the main symptom you're concerned about?" (e.g., frequency, incontinence)     Burning  And  Frequency   On urination   2. ONSET: "When did the  ________  start?"      Off  And  On   Since  July  9    Pt  Reports   sev  Days   Worse   3. PAIN: "Is there any pain?" If so, ask: "How bad is it?" (Scale: 1-10; mild, moderate, severe)        7  4. CAUSE: "What do you think is causing the symptoms?"        Pt   Thinks  May  Be   Urinary  Symptoms   5. OTHER SYMPTOMS: "Do you have any other symptoms?" (e.g., fever, flank pain, blood in urine, pain with urination)     Tender lower  abd   6. PREGNANCY: "Is there any chance you are pregnant?" "When was your last menstrual period?"     N/a  Protocols used: URINARY [facility]

## 2017-01-19 NOTE — Progress Notes (Signed)
Chief Complaint  Patient presents with  . Dysuria    kidney pain intermittent x months, dizziness and faintness  . Leg Pain    dx with skin condition, both legs and painful and can't bear wt on the left leg    HPI  Pt reports that since July 2018 she has been having symptoms of right kidney pain with stabbing pain She states that when she drinks water to try to flush her kidneys She feels like she is urinating all the time and has also been leaking urine  She reports that she has been having pain that is 8/10 She cannot get out of bed due to soreness on the right side and pressure in her bladder She takes a lot of tums and is wondering if she has kidney stones She reports that her mother has polycystic kidney disease in her mother She also reports that she has a history of squamous cell cancer of kidney   She reports that her left leg is hurting and the skin has been rubbed off from any compression stocking She states that her skin feels like it is being cut all the time She states that 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 ABDOMINAL PAIN, CHRONIC 05/28/2008  . ALLERGIC RHINITIS 09/21/2006  . ANEMIA-NOS 09/21/2006  . ASTHMA 09/21/2006  . ASYMPTOMATIC POSTMENOPAUSAL STATUS 05/28/2008  . Cataract   . CHEST PAIN, ATYPICAL 05/28/2008  . COPD (chronic obstructive pulmonary disease) ([place])   . DDD (degenerative disc disease)   . DIVERTICULITIS, HX OF 09/21/2006  . GERD 09/21/2006  . HYPERCHOLESTEROLEMIA 06/05/2009  . HYPERTENSION 09/21/2006  . HYPOTHYROIDISM 09/21/2006  . Peptic ulcer disease   . RENAL INSUFFICIENCY 09/21/2006  . Shingles   . Small bowel obstruction ([place])   . Somatization disorder 04/25/2007  . UNSPECIFIED URINARY CALCULUS 05/28/2008    Current Outpatient Medications  Medication Sig  Dispense Refill  . Ferrous Sulfate (IRON) 325 (65 FE) MG TABS Take 1 tablet by mouth daily.     Marland Kitchen levothyroxine (SYNTHROID, LEVOTHROID) 75 MCG tablet Take 1 tablet (75 mcg total) by mouth daily. 90 tablet 3  . fluticasone (FLOVENT HFA) 110 MCG/ACT inhaler Inhale 2 puffs into the lungs 2 (two) times daily. (Patient not taking: Reported on 01/19/2017) 12 Inhaler 4  . ibandronate (BONIVA) 150 MG tablet Take 1 tablet (150 mg total) by mouth every 30 (thirty) days. Take in the morning with a full glass of water, on an empty stomach, and do not take anything else by mouth or lie down for the next 30 min. (Patient not taking: Reported on 01/19/2017) 3 tablet 3   No current facility-administered medications for this visit.     Allergies:  Allergies  Allergen Reactions  . Metronidazole Shortness Of Breath and Nausea And Vomiting  . Macrobid [Nitrofurantoin]     REACTION: Syncope  . Pepcid [Famotidine]     Past Surgical History:  Procedure Laterality Date  . ABDOMINAL HYSTERECTOMY  1981  . acute Nephritis  1958  . APPENDECTOMY  1959  . Asthma & Pneumonia Birth  1970's  . CESAREAN SECTION    . CHOLECYSTECTOMY  1983  . COLON SURGERY    . DENTAL SURGERY  1970  . NASAL SINUS SURGERY  1967  . Pulmonary thrombosis    .  TONSILLECTOMY    . TUBAL LIGATION      Social History   Socioeconomic History  . Marital status: Divorced    Spouse name: None  . Number of children: None  . Years of education: None  . Highest education level: None  Social Needs  . Financial resource strain: None  . Food insecurity - worry: None  . Food insecurity - inability: None  . Transportation needs - medical: None  . Transportation needs - non-medical: None  Occupational History  . None  Tobacco Use  . Smoking status: Never Smoker  . Smokeless tobacco: Never Used  Substance and Sexual Activity  . Alcohol use: No  . Drug use: No  . Sexual activity: No  Other Topics Concern  . None  Social History  Narrative  . None    Family History  Problem Relation Age of Onset  . Cancer Mother   . Cancer Father      Review of Systems  Constitutional: Negative for chills and fever.  Eyes: Negative for blurred vision and double vision.  Cardiovascular: Negative for chest pain and palpitations.  Gastrointestinal: Negative for nausea and vomiting.  Genitourinary: Positive for dysuria, frequency and urgency. Negative for hematuria.  Neurological: Positive for dizziness. Negative for tingling.      Objective: Vitals:   01/19/17 1650  BP: 140/74  Pulse: 89  Resp: 17  Temp: 99.5 F (37.5 C)  TempSrc: Oral  SpO2: 96%  Weight: 140 lb 6.4 oz (63.7 kg)  Height: 5' 5"  (1.651 m)    Physical Exam  Constitutional: She is oriented to person, place, and time. She appears well-developed and well-nourished.  HENT:  Head: Normocephalic and atraumatic.  Eyes: Conjunctivae and EOM are normal.  Cardiovascular: Normal rate, regular rhythm and normal heart sounds.   Pulmonary/Chest: Effort normal and breath sounds normal. No respiratory distress. She has no wheezes.  Abdominal: Normal appearance and bowel sounds are normal. There is suprapubic tenderness. There is no CVA tenderness.  Neurological: She is alert and oriented to person, place, and time.    Component     Latest Ref Rng & Units 08/15/2016 11/21/2016 01/19/2017 01/19/2017           5:06 PM  5:06 PM  Color, Urine     Yellow;Lt. Yellow YELLOW YELLOW    Appearance     Clear CLEAR CLEAR    Specific Gravity, Urine     1.000 - 1.030 <=1.005 (A) 1.010    pH     5.0 - 8.0 7.0 6.5    Total Protein, Urine-[id number]     Negative NEGATIVE NEGATIVE    Urine Glucose     Negative NEGATIVE NEGATIVE    Ketones, ur     Negative NEGATIVE NEGATIVE    Bilirubin Urine     Negative NEGATIVE NEGATIVE    Hgb urine dipstick     Negative NEGATIVE NEGATIVE    Urobilinogen, UA     0.2 or 1.0 E.U./dL 0.2 0.2 0.2   Leukocytes, UA     Negative NEGATIVE  TRACE (A) Small (1+) (A)   Nitrite     Negative NEGATIVE NEGATIVE    WBC, UA     0-2/hpf none seen 0-2/hpf    RBC / HPF     0-2/hpf none seen none seen    Squamous Epithelial / LPF     Rare(0-4/hpf) Rare(0-4/hpf)     Color, UA     yellow   yellow   Clarity, UA  clear   clear   Glucose     negative mg/dL   negative   Bilirubin, UA     negative   negative negative  Specific Gravity, UA     1.010 - 1.025   1.015   RBC, UA     negative   negative   pH, UA     5.0 - 8.0   7.0   Protein, UA     negative mg/dL   negative   Nitrite, UA     Negative   Negative    Assessment and Plan Diondra was seen today for dysuria and leg pain.  Diagnoses and all orders for this visit:  Symptoms of urinary tract infection -     POCT urinalysis dipstick -     Urine Culture  Kidney pain -     Ambulatory referral to Urology  Bladder pain -     Ambulatory referral to Urology  Other urinary incontinence -     Ambulatory referral to Urology  Other orders -     Cancel: Flu Vaccine QUAD 36+ mos IM  Given the long standing concerns since July 2018 will refer to Urology for further work up Currently UA is reassuring Will send urine for culture advised pt to continue hydration Follow up with PCP after seeing urology She declined empiric antibiotics    [place]

## 2017-01-20 LAB — URINE CULTURE

## 2017-02-14 ENCOUNTER — Encounter: Payer: Self-pay | Admitting: Family Medicine

## 2017-02-14 DIAGNOSIS — N200 Calculus of kidney: Secondary | ICD-10-CM | POA: Diagnosis not present

## 2017-02-14 DIAGNOSIS — R1084 Generalized abdominal pain: Secondary | ICD-10-CM | POA: Diagnosis not present

## 2017-02-21 DIAGNOSIS — B370 Candidal stomatitis: Secondary | ICD-10-CM | POA: Diagnosis not present

## 2017-02-21 DIAGNOSIS — M25511 Pain in right shoulder: Secondary | ICD-10-CM | POA: Diagnosis not present

## 2017-02-22 DIAGNOSIS — M67911 Unspecified disorder of synovium and tendon, right shoulder: Secondary | ICD-10-CM | POA: Diagnosis not present

## 2017-02-23 ENCOUNTER — Ambulatory Visit (INDEPENDENT_AMBULATORY_CARE_PROVIDER_SITE_OTHER): Payer: Medicare Other | Admitting: Endocrinology

## 2017-02-23 ENCOUNTER — Other Ambulatory Visit: Payer: Self-pay | Admitting: Endocrinology

## 2017-02-23 ENCOUNTER — Encounter: Payer: Self-pay | Admitting: Endocrinology

## 2017-02-23 VITALS — BP 142/70 | HR 46 | Temp 97.8°F | Wt 129.2 lb

## 2017-02-23 DIAGNOSIS — M810 Age-related osteoporosis without current pathological fracture: Secondary | ICD-10-CM

## 2017-02-23 MED ORDER — CLOTRIMAZOLE 10 MG MT TROC: 10.0000 mg | Freq: Every day | OROMUCOSAL | 0 refills | Status: DC

## 2017-02-23 NOTE — Progress Notes (Signed)
Subjective:    Patient ID: [name], female    DOB: [birth year], 83 y.o.   MRN: [id number]  HPI Pt states few days of moderate pain at the throat, and assoc soreness of the mouth.   She stopped boniva, due to fear of heartburn.  Past Medical History:  Diagnosis Date  . ABDOMINAL PAIN, CHRONIC 05/28/2008  . ALLERGIC RHINITIS 09/21/2006  . ANEMIA-NOS 09/21/2006  . ASTHMA 09/21/2006  . ASYMPTOMATIC POSTMENOPAUSAL STATUS 05/28/2008  . Cataract   . CHEST PAIN, ATYPICAL 05/28/2008  . COPD (chronic obstructive pulmonary disease) ([place])   . DDD (degenerative disc disease)   . DIVERTICULITIS, HX OF 09/21/2006  . GERD 09/21/2006  . HYPERCHOLESTEROLEMIA 06/05/2009  . HYPERTENSION 09/21/2006  . HYPOTHYROIDISM 09/21/2006  . Peptic ulcer disease   . RENAL INSUFFICIENCY 09/21/2006  . Shingles   . Small bowel obstruction ([place])   . Somatization disorder 04/25/2007  . UNSPECIFIED URINARY CALCULUS 05/28/2008    Past Surgical History:  Procedure Laterality Date  . ABDOMINAL HYSTERECTOMY  1981  . acute Nephritis  1958  . APPENDECTOMY  1959  . Asthma & Pneumonia Birth  1970's  . CESAREAN SECTION    . CHOLECYSTECTOMY  1983  . COLON SURGERY    . DENTAL SURGERY  1970  . NASAL SINUS SURGERY  1967  . Pulmonary thrombosis    . TONSILLECTOMY    . TUBAL LIGATION      Social History   Socioeconomic History  . Marital status: Divorced    Spouse name: Not on file  . Number of children: Not on file  . Years of education: Not on file  . Highest education level: Not on file  Social Needs  . Financial resource strain: Not on file  . Food insecurity - worry: Not on file  . Food insecurity - inability: Not on file  . Transportation needs - medical: Not on file  . Transportation needs - non-medical: Not on file  Occupational History  . Not on file  Tobacco Use  . Smoking status: Never Smoker  . Smokeless tobacco: Never Used  Substance and Sexual Activity  . Alcohol use: No  . Drug use: No  . Sexual  activity: No  Other Topics Concern  . Not on file  Social History Narrative  . Not on file    Current Outpatient Medications on File Prior to Visit  Medication Sig Dispense Refill  . Ferrous Sulfate (IRON) 325 (65 FE) MG TABS Take 1 tablet by mouth daily.     Marland Kitchen FLOVENT HFA 110 MCG/ACT inhaler INHALE 2 PUFFS BY MOUTH TWICE A DAY 12 Inhaler 0  . levothyroxine (SYNTHROID, LEVOTHROID) 75 MCG tablet Take 1 tablet (75 mcg total) by mouth daily. 90 tablet 3   No current facility-administered medications on file prior to visit.     Allergies  Allergen Reactions  . Fluconazole Shortness Of Breath  . Metronidazole Shortness Of Breath and Nausea And Vomiting  . Macrobid [Nitrofurantoin]     REACTION: Syncope  . Pepcid [Famotidine]     Family History  Problem Relation Age of Onset  . Cancer Mother   . Cancer Father     BP (!) 142/70 (BP Location: Left Arm, Patient Position: Sitting, Cuff Size: Normal)   Pulse (!) 46   Temp 97.8 F (36.6 C)   Wt 129 lb 3.2 oz (58.6 kg)   SpO2 98%   BMI 21.50 kg/m    Review of Systems Denies fever and sob.  Objective:   Physical Exam VITAL SIGNS:  See vs page GENERAL: no distress mouth: no lesion seen.  Oral mucosa is normal.  No erythema.        Assessment & Plan:  Oral pain, new, uncertain etiology Osteoporosis, therapy is limited by concern about GERD  We discussed.  She declines reclast.   Patient Instructions  I have sent a prescription to your pharmacy, for oral lozenges. It is OK to stay off the osteoporosis pill.

## 2017-02-23 NOTE — Patient Instructions (Addendum)
I have sent a prescription to your pharmacy, for oral lozenges. It is OK to stay off the osteoporosis pill.

## 2017-02-24 NOTE — Telephone Encounter (Signed)
Please advise on message below.

## 2017-02-24 NOTE — Telephone Encounter (Signed)
Patient stated that she had a allergic reaction to medication for a uti, she said her skin was red hot and burning and a pressing pain between her shoulder blades. Please advise on what to do

## 2017-03-01 DIAGNOSIS — M25511 Pain in right shoulder: Secondary | ICD-10-CM | POA: Diagnosis not present

## 2017-03-03 ENCOUNTER — Ambulatory Visit (HOSPITAL_COMMUNITY)
Admission: EM | Admit: 2017-03-03 | Discharge: 2017-03-03 | Disposition: A | Discharge status: Home / Self Care | Payer: Medicare Other | Attending: Family Medicine | Admitting: Family Medicine

## 2017-03-03 ENCOUNTER — Telehealth: Payer: Self-pay | Admitting: Endocrinology

## 2017-03-03 ENCOUNTER — Other Ambulatory Visit: Payer: Self-pay

## 2017-03-03 ENCOUNTER — Encounter (HOSPITAL_COMMUNITY): Payer: Self-pay | Admitting: Emergency Medicine

## 2017-03-03 DIAGNOSIS — K1379 Other lesions of oral mucosa: Secondary | ICD-10-CM

## 2017-03-03 DIAGNOSIS — J029 Acute pharyngitis, unspecified: Secondary | ICD-10-CM | POA: Diagnosis present

## 2017-03-03 DIAGNOSIS — I10 Essential (primary) hypertension: Secondary | ICD-10-CM | POA: Insufficient documentation

## 2017-03-03 DIAGNOSIS — Z9851 Tubal ligation status: Secondary | ICD-10-CM | POA: Insufficient documentation

## 2017-03-03 DIAGNOSIS — Z9049 Acquired absence of other specified parts of digestive tract: Secondary | ICD-10-CM | POA: Diagnosis not present

## 2017-03-03 DIAGNOSIS — Z883 Allergy status to other anti-infective agents status: Secondary | ICD-10-CM | POA: Insufficient documentation

## 2017-03-03 DIAGNOSIS — Z881 Allergy status to other antibiotic agents status: Secondary | ICD-10-CM | POA: Diagnosis not present

## 2017-03-03 LAB — POCT RAPID STREP A: STREPTOCOCCUS, GROUP A SCREEN (DIRECT): NEGATIVE

## 2017-03-03 MED ORDER — CHLORHEXIDINE GLUCONATE 0.12 % MT SOLN: 15.0000 mL | Freq: Two times a day (BID) | OROMUCOSAL | 0 refills | Status: DC

## 2017-03-03 NOTE — ED Triage Notes (Signed)
Pt c/o sore throat, states "I think I have thrush too" pt states she saw her PCP for thrush and given medicines but she had some sort of reaction to the medicines. x9 days.

## 2017-03-03 NOTE — ED Provider Notes (Signed)
[place]    CSN: [id number] Arrival date & time: 03/03/17  1224     History   Chief Complaint Chief Complaint  Patient presents with  . Sore Throat    HPI [name] is a 83 y.o. female.   [name] presents with family with complaints of mouth and throat pain which has been worsening over the past two weeks. States she has had similar in the past and wad attributed to thrush due to her use of inhalers and steroids. Significant medical history, see list. States she has reacted to fluconazole as well as nystatin in the past. Was provided clotrimazole by her PCP but she only used two lozenges as she felt she was having a reaction. Pain is worsening. Bad taste in her mouth. She has a history of gerd and gastroparesis which she is being treated for. Feels hoarse. No history of smoking. History of asthma. She states it feels like she can feel each of her individual teeth. Without fevers. Denies vaginal discharge or itching.   ROS per HPI.       Past Medical History:  Diagnosis Date  . ABDOMINAL PAIN, CHRONIC 05/28/2008  . ALLERGIC RHINITIS 09/21/2006  . ANEMIA-NOS 09/21/2006  . ASTHMA 09/21/2006  . ASYMPTOMATIC POSTMENOPAUSAL STATUS 05/28/2008  . Cataract   . CHEST PAIN, ATYPICAL 05/28/2008  . COPD (chronic obstructive pulmonary disease) ([place])   . DDD (degenerative disc disease)   . DIVERTICULITIS, HX OF 09/21/2006  . GERD 09/21/2006  . HYPERCHOLESTEROLEMIA 06/05/2009  . HYPERTENSION 09/21/2006  . HYPOTHYROIDISM 09/21/2006  . Peptic ulcer disease   . RENAL INSUFFICIENCY 09/21/2006  . Shingles   . Small bowel obstruction ([place])   . Somatization disorder 04/25/2007  . UNSPECIFIED URINARY CALCULUS 05/28/2008    Patient Active Problem List   Diagnosis Date Noted  . Osteoporosis 11/21/2016  . Postmenopausal status 09/06/2016  . Other nonspecific abnormal finding of lung field 08/14/2015  . Hyponatremia 07/21/2014  . Menopausal state 07/18/2014  . Hematest positive stools  08/05/2013  . Anemia due to other cause 08/05/2013  . Cerumen impaction 06/03/2013  . History of adrenal disorder 10/15/2012  . Rash and nonspecific skin eruption 10/15/2012  . Iron deficiency anemia 06/20/2012  . Psychogenic polydipsia 06/20/2012  . Nonspecific abnormal electrocardiogram (ECG) (EKG) 06/20/2012  . Polyuria 05/02/2012  . Pelvic pain in female 08/16/2011  . Leg ulcer ([place]) 06/16/2010  . Routine general medical examination at a health care facility 06/11/2010  . Encounter for long-term (current) use of other medications 06/11/2010  . HYPERCHOLESTEROLEMIA 06/05/2009  . UNSPECIFIED URINARY CALCULUS 05/28/2008  . CHEST PAIN, ATYPICAL 05/28/2008  . ABDOMINAL PAIN, CHRONIC 05/28/2008  . ASYMPTOMATIC POSTMENOPAUSAL STATUS 05/28/2008  . SOMATIZATION DISORDER 04/25/2007  . Hypothyroidism 09/21/2006  . Essential hypertension 09/21/2006  . ALLERGIC RHINITIS 09/21/2006  . ASTHMA 09/21/2006  . GERD 09/21/2006  . Disorder resulting from impaired renal function 09/21/2006  . DIVERTICULITIS, HX OF 09/21/2006    Past Surgical History:  Procedure Laterality Date  . ABDOMINAL HYSTERECTOMY  1981  . acute Nephritis  1958  . APPENDECTOMY  1959  . Asthma & Pneumonia Birth  1970's  . CESAREAN SECTION    . CHOLECYSTECTOMY  1983  . COLON SURGERY    . DENTAL SURGERY  1970  . NASAL SINUS SURGERY  1967  . Pulmonary thrombosis    . TONSILLECTOMY    . TUBAL LIGATION      OB History    No data available  Home Medications    Prior to Admission medications   Medication Sig Start Date End Date Taking? Authorizing Provider  chlorhexidine (PERIDEX) 0.12 % solution Use as directed 15 mLs in the mouth or throat 2 (two) times daily. Swish and spit 03/03/17   Augusto Gamble B, NP  clotrimazole (MYCELEX) 10 MG troche Take 1 tablet (10 mg total) by mouth 5 (five) times daily. 02/23/17   Renato Shin, MD  Ferrous Sulfate (IRON) 325 (65 FE) MG TABS Take 1 tablet by mouth daily.      [provider]  FLOVENT HFA 110 MCG/ACT inhaler INHALE 2 PUFFS BY MOUTH TWICE A DAY 02/24/17   Renato Shin, MD  levothyroxine (SYNTHROID, LEVOTHROID) 75 MCG tablet Take 1 tablet (75 mcg total) by mouth daily. 11/21/16   Renato Shin, MD    Family History Family History  Problem Relation Age of Onset  . Cancer Mother   . Cancer Father     Social History Social History   Tobacco Use  . Smoking status: Never Smoker  . Smokeless tobacco: Never Used  Substance Use Topics  . Alcohol use: No  . Drug use: No     Allergies   Fluconazole; Metronidazole; Macrobid [nitrofurantoin]; and Pepcid [famotidine]   Review of Systems Review of Systems   Physical Exam Triage Vital Signs ED Triage Vitals  Enc Vitals Group     BP 03/03/17 1307 140/60     Pulse Rate 03/03/17 1307 74     Resp 03/03/17 1307 16     Temp 03/03/17 1307 98.3 F (36.8 C)     Temp Source 03/03/17 1307 Oral     SpO2 03/03/17 1307 100 %     Weight --      Height --      Head Circumference --      Peak Flow --      Pain Score 03/03/17 1310 9     Pain Loc --      Pain Edu? --      Excl. in [place]? --    No data found.  Updated Vital Signs BP 140/60 (BP Location: Left Arm)   Pulse 74   Temp 98.3 F (36.8 C) (Oral)   Resp 16   SpO2 100%   Visual Acuity Right Eye Distance:   Left Eye Distance:   Bilateral Distance:    Right Eye Near:   Left Eye Near:    Bilateral Near:     Physical Exam  Constitutional: She is oriented to person, place, and time. She appears well-developed and well-nourished. No distress.  HENT:  Head: Normocephalic and atraumatic.  Right Ear: Tympanic membrane normal.  Left Ear: Tympanic membrane normal.  Mouth/Throat: Uvula is midline and oropharynx is clear and moist. No oral lesions. No uvula swelling. No oropharyngeal exudate, posterior oropharyngeal edema, posterior oropharyngeal erythema or tonsillar abscesses.  Mild dryness to mouth and throat  Cardiovascular:  Normal rate, regular rhythm and normal heart sounds.  Pulmonary/Chest: Effort normal and breath sounds normal.  Neurological: She is alert and oriented to person, place, and time.  Skin: Skin is warm and dry.     UC Treatments / Results  Labs (all labs ordered are listed, but only abnormal results are displayed) Labs Reviewed  CULTURE, GROUP A STREP [facility])  POCT RAPID STREP A    EKG  EKG Interpretation None       Radiology No results found.  Procedures Procedures (including critical care time)  Medications Ordered in UC  Medications - No data to display   Initial Impression / Assessment and Plan / UC Course  I have reviewed the triage vital signs and the nursing notes.  Pertinent labs & imaging results that were available during my care of the patient were reviewed by me and considered in my medical decision making (see chart for details).     Without mouth lesions, ulcers, coating, plaquing or other acute findings on exam. Patient concerned about medication reactions. peridex offered at this time, recommended continued follow up with PCP and/or ENT for further evaluation. Patient verbalized understanding and agreeable to plan.    Final Clinical Impressions(s) / UC Diagnoses   Final diagnoses:  Oral pain of unknown etiology    ED Discharge Orders        Ordered    chlorhexidine (PERIDEX) 0.12 % solution  2 times daily     03/03/17 1348       Controlled Substance Prescriptions [place] Controlled Substance Registry consulted? Not Applicable   Zigmund Gottron, NP 03/03/17 1354

## 2017-03-03 NOTE — Discharge Instructions (Signed)
I do not see any signs of thrush or lesions to your mouth today. Please try this antiseptic wash, twice a day, swish around and spit. Please follow up for a recheck with your primary care provider and/or ENT, I have provided today's on-call ENT, or you may try another of your choosing.

## 2017-03-03 NOTE — Telephone Encounter (Signed)
The pill I prescribed last week treats this, so you don't have to worry about thrush

## 2017-03-03 NOTE — Telephone Encounter (Signed)
inhaler causing thrush, patient is in so much pain, need something over the week. She is asking to call her today.

## 2017-03-05 LAB — CULTURE, GROUP A STREP (THRC)

## 2017-03-06 DIAGNOSIS — M25511 Pain in right shoulder: Secondary | ICD-10-CM | POA: Diagnosis not present

## 2017-03-06 NOTE — Telephone Encounter (Signed)
I called patient & she said that she had ruled out thrush as well. The inhaler she stated wasn't helping, but she was trying a mouth wash instead. Patient stated that she will give that a few more days & call back if she feels she needs to be seen again.

## 2017-03-08 DIAGNOSIS — M25511 Pain in right shoulder: Secondary | ICD-10-CM | POA: Diagnosis not present

## 2017-03-13 DIAGNOSIS — M25511 Pain in right shoulder: Secondary | ICD-10-CM | POA: Diagnosis not present

## 2017-03-15 DIAGNOSIS — M25511 Pain in right shoulder: Secondary | ICD-10-CM | POA: Diagnosis not present

## 2017-03-20 DIAGNOSIS — M25511 Pain in right shoulder: Secondary | ICD-10-CM | POA: Diagnosis not present

## 2017-03-22 DIAGNOSIS — M25511 Pain in right shoulder: Secondary | ICD-10-CM | POA: Diagnosis not present

## 2017-03-24 ENCOUNTER — Other Ambulatory Visit: Payer: Self-pay | Admitting: Endocrinology

## 2017-03-27 DIAGNOSIS — M25511 Pain in right shoulder: Secondary | ICD-10-CM | POA: Diagnosis not present

## 2017-03-29 DIAGNOSIS — M25511 Pain in right shoulder: Secondary | ICD-10-CM | POA: Diagnosis not present

## 2017-04-24 ENCOUNTER — Other Ambulatory Visit: Payer: Self-pay | Admitting: Endocrinology

## 2017-04-27 ENCOUNTER — Telehealth: Payer: Self-pay | Admitting: Endocrinology

## 2017-04-27 NOTE — Telephone Encounter (Signed)
Patient needs more refills of medication FLOVENT HFA 110 MCG/ACT inhaler [[id number]],  [id number] [facility] [address] #:  [id number]

## 2017-05-01 ENCOUNTER — Telehealth: Payer: Self-pay | Admitting: Endocrinology

## 2017-05-01 ENCOUNTER — Other Ambulatory Visit: Payer: Self-pay

## 2017-05-01 MED ORDER — FLUTICASONE PROPIONATE HFA 110 MCG/ACT IN AERO: 2.0000 | INHALATION_SPRAY | Freq: Two times a day (BID) | RESPIRATORY_TRACT | 0 refills | Status: DC

## 2017-05-01 NOTE — Telephone Encounter (Signed)
I have sent into patient's pharmacy/.

## 2017-05-01 NOTE — Telephone Encounter (Signed)
error 

## 2017-05-01 NOTE — Telephone Encounter (Signed)
Patient stated she called last week about her medication.  states that refills were denied by the office. She is needing refills sent   FLOVENT HFA 110 MCG/ACT inhaler [[id number]],       [id number] [place] [place] - [address]

## 2017-05-05 ENCOUNTER — Other Ambulatory Visit: Payer: Self-pay

## 2017-06-06 DIAGNOSIS — D225 Melanocytic nevi of trunk: Secondary | ICD-10-CM | POA: Diagnosis not present

## 2017-06-06 DIAGNOSIS — L309 Dermatitis, unspecified: Secondary | ICD-10-CM | POA: Diagnosis not present

## 2017-06-06 DIAGNOSIS — L821 Other seborrheic keratosis: Secondary | ICD-10-CM | POA: Diagnosis not present

## 2017-06-06 DIAGNOSIS — L814 Other melanin hyperpigmentation: Secondary | ICD-10-CM | POA: Diagnosis not present

## 2017-06-06 DIAGNOSIS — D1801 Hemangioma of skin and subcutaneous tissue: Secondary | ICD-10-CM | POA: Diagnosis not present

## 2017-06-17 ENCOUNTER — Ambulatory Visit (HOSPITAL_COMMUNITY)
Admission: EM | Admit: 2017-06-17 | Discharge: 2017-06-17 | Disposition: A | Discharge status: Home / Self Care | Payer: Medicare Other | Attending: Family Medicine | Admitting: Family Medicine

## 2017-06-17 ENCOUNTER — Other Ambulatory Visit: Payer: Self-pay

## 2017-06-17 ENCOUNTER — Encounter (HOSPITAL_COMMUNITY): Payer: Self-pay | Admitting: *Deleted

## 2017-06-17 DIAGNOSIS — N390 Urinary tract infection, site not specified: Secondary | ICD-10-CM | POA: Diagnosis not present

## 2017-06-17 DIAGNOSIS — E7800 Pure hypercholesterolemia, unspecified: Secondary | ICD-10-CM | POA: Insufficient documentation

## 2017-06-17 DIAGNOSIS — J449 Chronic obstructive pulmonary disease, unspecified: Secondary | ICD-10-CM | POA: Diagnosis not present

## 2017-06-17 DIAGNOSIS — J029 Acute pharyngitis, unspecified: Secondary | ICD-10-CM | POA: Diagnosis not present

## 2017-06-17 DIAGNOSIS — K219 Gastro-esophageal reflux disease without esophagitis: Secondary | ICD-10-CM | POA: Insufficient documentation

## 2017-06-17 DIAGNOSIS — J45909 Unspecified asthma, uncomplicated: Secondary | ICD-10-CM | POA: Diagnosis not present

## 2017-06-17 DIAGNOSIS — D692 Other nonthrombocytopenic purpura: Secondary | ICD-10-CM | POA: Diagnosis not present

## 2017-06-17 DIAGNOSIS — X58XXXD Exposure to other specified factors, subsequent encounter: Secondary | ICD-10-CM | POA: Diagnosis not present

## 2017-06-17 DIAGNOSIS — E039 Hypothyroidism, unspecified: Secondary | ICD-10-CM | POA: Insufficient documentation

## 2017-06-17 DIAGNOSIS — I10 Essential (primary) hypertension: Secondary | ICD-10-CM | POA: Insufficient documentation

## 2017-06-17 DIAGNOSIS — Z87442 Personal history of urinary calculi: Secondary | ICD-10-CM | POA: Diagnosis not present

## 2017-06-17 DIAGNOSIS — R109 Unspecified abdominal pain: Secondary | ICD-10-CM

## 2017-06-17 DIAGNOSIS — S43421D Sprain of right rotator cuff capsule, subsequent encounter: Secondary | ICD-10-CM

## 2017-06-17 DIAGNOSIS — M810 Age-related osteoporosis without current pathological fracture: Secondary | ICD-10-CM | POA: Insufficient documentation

## 2017-06-17 DIAGNOSIS — R509 Fever, unspecified: Secondary | ICD-10-CM

## 2017-06-17 HISTORY — DX: Other nonthrombocytopenic purpura: D69.2

## 2017-06-17 LAB — CBC WITH DIFFERENTIAL/PLATELET
Basophils Absolute: 0 10*3/uL (ref 0.0–0.1)
Basophils Relative: 0 %
Eosinophils Absolute: 0.2 10*3/uL (ref 0.0–0.7)
Eosinophils Relative: 2 %
HCT: 34.8 % — ABNORMAL LOW (ref 36.0–46.0)
Hemoglobin: 11.9 g/dL — ABNORMAL LOW (ref 12.0–15.0)
Lymphocytes Relative: 26 %
Lymphs Abs: 2.8 10*3/uL (ref 0.7–4.0)
MCH: 30.9 pg (ref 26.0–34.0)
MCHC: 34.2 g/dL (ref 30.0–36.0)
MCV: 90.4 fL (ref 78.0–100.0)
Monocytes Absolute: 1.7 10*3/uL — ABNORMAL HIGH (ref 0.1–1.0)
Monocytes Relative: 16 %
Neutro Abs: 6.2 10*3/uL (ref 1.7–7.7)
Neutrophils Relative %: 56 %
Platelets: 325 10*3/uL (ref 150–400)
RBC: 3.85 MIL/uL — ABNORMAL LOW (ref 3.87–5.11)
RDW: 13.8 % (ref 11.5–15.5)
WBC: 10.9 10*3/uL — ABNORMAL HIGH (ref 4.0–10.5)

## 2017-06-17 LAB — POCT I-STAT, CHEM 8
BUN: 14 mg/dL (ref 6–20)
CALCIUM ION: 1.23 mmol/L (ref 1.15–1.40)
Chloride: 93 mmol/L — ABNORMAL LOW (ref 101–111)
Creatinine, Ser: 0.5 mg/dL (ref 0.44–1.00)
Glucose, Bld: 134 mg/dL — ABNORMAL HIGH (ref 65–99)
HCT: 37 % (ref 36.0–46.0)
HEMOGLOBIN: 12.6 g/dL (ref 12.0–15.0)
Potassium: 4.6 mmol/L (ref 3.5–5.1)
SODIUM: 129 mmol/L — AB (ref 135–145)
TCO2: 25 mmol/L (ref 22–32)

## 2017-06-17 LAB — POCT URINALYSIS DIP (DEVICE)
GLUCOSE, UA: 100 mg/dL — AB
Hgb urine dipstick: NEGATIVE
Ketones, ur: NEGATIVE mg/dL
NITRITE: NEGATIVE
PH: 6.5 (ref 5.0–8.0)
Protein, ur: 30 mg/dL — AB
Specific Gravity, Urine: 1.015 (ref 1.005–1.030)
UROBILINOGEN UA: 1 mg/dL (ref 0.0–1.0)

## 2017-06-17 MED ORDER — CHLORHEXIDINE GLUCONATE 0.12 % MT SOLN: 15.0000 mL | Freq: Two times a day (BID) | OROMUCOSAL | 3 refills | Status: DC

## 2017-06-17 MED ORDER — CEPHALEXIN 500 MG PO CAPS: 500.0000 mg | ORAL_CAPSULE | Freq: Four times a day (QID) | ORAL | 0 refills | Status: DC

## 2017-06-17 NOTE — Discharge Instructions (Addendum)
There is some evidence that you have a urinary tract infection. Therefore we are starting you on some antibiotics. We are also running a urine culture to better understand whether the infection is coming from the bladder.  He will need to have a follow-up exam with Dr. Loanne Drilling next week to make sure the infection is completely cleared and your symptoms are resolved.  I am refilling your mouthwash to help with the burning in her mouth.

## 2017-06-17 NOTE — ED Notes (Signed)
Patient is unable to void at this time 

## 2017-06-17 NOTE — ED Provider Notes (Addendum)
[place]   [id number] 06/17/17 Arrival Time: 98   SUBJECTIVE:  [name] is a 83 y.o. female who presents to the urgent care with complaint of fever over 100 for the last week in the context of multiple severe medical problems.  Patient normally sees Dr. Renato Shin. She is being treated for chronic asthma, osteoporosis, chronic Purpura in the lower extremities, right flank pain which is gone undiagnosed despite ongoing evaluations.  Patient states that her right flank pain began 6 months ago and she is seen in the urologists and they've done multiple imaging studies as well as urine tests which have been unrevealing.  Patient also has chronic mild sore which she attributes to using steroid inhalers for her asthma as well as steroid injection to her right shoulder are she has a rotator cuff injury.     Past Medical History:  Diagnosis Date  . ABDOMINAL PAIN, CHRONIC 05/28/2008  . ALLERGIC RHINITIS 09/21/2006  . ANEMIA-NOS 09/21/2006  . ASTHMA 09/21/2006  . ASYMPTOMATIC POSTMENOPAUSAL STATUS 05/28/2008  . Cataract   . CHEST PAIN, ATYPICAL 05/28/2008  . COPD (chronic obstructive pulmonary disease) ([place])   . DDD (degenerative disc disease)   . DIVERTICULITIS, HX OF 09/21/2006  . GERD 09/21/2006  . HYPERCHOLESTEROLEMIA 06/05/2009  . HYPERTENSION 09/21/2006  . HYPOTHYROIDISM 09/21/2006  . Peptic ulcer disease   . Purpura ([place])   . RENAL INSUFFICIENCY 09/21/2006  . Shingles   . Small bowel obstruction ([place])   . Somatization disorder 04/25/2007  . UNSPECIFIED URINARY CALCULUS 05/28/2008   Family History  Problem Relation Age of Onset  . Cancer Mother   . Cancer Father    Social History   Socioeconomic History  . Marital status: Divorced    Spouse name: Not on file  . Number of children: Not on file  . Years of education: Not on file  . Highest education level: Not on file  Occupational History  . Not on file  Social Needs  . Financial resource strain: Not on  file  . Food insecurity:    Worry: Not on file    Inability: Not on file  . Transportation needs:    Medical: Not on file    Non-medical: Not on file  Tobacco Use  . Smoking status: Never Smoker  . Smokeless tobacco: Never Used  Substance and Sexual Activity  . Alcohol use: No  . Drug use: No  . Sexual activity: Never  Lifestyle  . Physical activity:    Days per week: Not on file    Minutes per session: Not on file  . Stress: Not on file  Relationships  . Social connections:    Talks on phone: Not on file    Gets together: Not on file    Attends religious service: Not on file    Active member of club or organization: Not on file    Attends meetings of clubs or organizations: Not on file    Relationship status: Not on file  . Intimate partner violence:    Fear of current or ex partner: Not on file    Emotionally abused: Not on file    Physically abused: Not on file    Forced sexual activity: Not on file  Other Topics Concern  . Not on file  Social History Narrative  . Not on file   Current Meds  Medication Sig  . fluticasone (FLOVENT HFA) 110 MCG/ACT inhaler Inhale 2 puffs into the lungs 2 (two) times daily.  Marland Kitchen  levothyroxine (SYNTHROID, LEVOTHROID) 75 MCG tablet Take 1 tablet (75 mcg total) by mouth daily.   Allergies  Allergen Reactions  . Fluconazole Shortness Of Breath  . Metronidazole Shortness Of Breath and Nausea And Vomiting  . Macrobid [Nitrofurantoin]     REACTION: Syncope  . Pepcid [Famotidine]       ROS: As per HPI, remainder of ROS negative.   OBJECTIVE:   Vitals:   06/17/17 1339  BP: (!) 133/56  Pulse: 84  Resp: 16  Temp: 98.2 F (36.8 C)  TempSrc: Oral  SpO2: 100%     General appearance: alert; patient appears quite frustrated Eyes: PERRL; EOMI; conjunctiva normal HENT: normocephalic; atraumatic; TMs normal, canal normal, external ears normal without trauma; nasal mucosa normal; oral mucosa mildly erythematous posterior  pharynx Neck: supple; no adenopathy or thyromegaly Lungs: Expiratory wheezes auscultation bilaterally Heart: regular rate and rhythm Abdomen: soft, minimally tender right flank; bowel sounds normal; no masses or organomegaly; no guarding or rebound tenderness Back: no CVA tenderness Extremities: no cyanosis or edema; symmetrical with no gross deformities Skin: warm and dry; multiple annular hypopigmented macules on both lower extremities that appear to start with erythema and then gradually darken. Neurologic: normal gait; grossly normal Psychological: alert and cooperative; appears depressed      Labs:  Results for orders placed or performed during the hospital encounter of 06/17/17  CBC with Differential  Result Value Ref Range   WBC 10.9 (H) 4.0 - 10.5 K/uL   RBC 3.85 (L) 3.87 - 5.11 MIL/uL   Hemoglobin 11.9 (L) 12.0 - 15.0 g/dL   HCT 34.8 (L) 36.0 - 46.0 %   MCV 90.4 78.0 - 100.0 fL   MCH 30.9 26.0 - 34.0 pg   MCHC 34.2 30.0 - 36.0 g/dL   RDW 13.8 11.5 - 15.5 %   Platelets 325 150 - 400 K/uL   Neutrophils Relative % PENDING %   Neutro Abs PENDING 1.7 - 7.7 K/uL   Band Neutrophils PENDING %   Lymphocytes Relative PENDING %   Lymphs Abs PENDING 0.7 - 4.0 K/uL   Monocytes Relative PENDING %   Monocytes Absolute PENDING 0.1 - 1.0 K/uL   Eosinophils Relative PENDING %   Eosinophils Absolute PENDING 0.0 - 0.7 K/uL   Basophils Relative PENDING %   Basophils Absolute PENDING 0.0 - 0.1 K/uL   WBC Morphology PENDING    RBC Morphology PENDING    Smear Review PENDING    nRBC PENDING 0 /100 WBC   Metamyelocytes Relative PENDING %   Myelocytes PENDING %   Promyelocytes Relative PENDING %   Blasts PENDING %  I-STAT, chem 8  Result Value Ref Range   Sodium 129 (L) 135 - 145 mmol/L   Potassium 4.6 3.5 - 5.1 mmol/L   Chloride 93 (L) 101 - 111 mmol/L   BUN 14 6 - 20 mg/dL   Creatinine, Ser 0.50 0.44 - 1.00 mg/dL   Glucose, Bld 134 (H) 65 - 99 mg/dL   Calcium, Ion 1.23 1.15 -  1.40 mmol/L   TCO2 25 22 - 32 mmol/L   Hemoglobin 12.6 12.0 - 15.0 g/dL   HCT 37.0 36.0 - 46.0 %  POCT urinalysis dip (device)  Result Value Ref Range   Glucose, UA 100 (A) NEGATIVE mg/dL   Bilirubin Urine SMALL (A) NEGATIVE   Ketones, ur NEGATIVE NEGATIVE mg/dL   Specific Gravity, Urine 1.015 1.005 - 1.030   Hgb urine dipstick NEGATIVE NEGATIVE   pH 6.5 5.0 - 8.0  Protein, ur 30 (A) NEGATIVE mg/dL   Urobilinogen, UA 1.0 0.0 - 1.0 mg/dL   Nitrite NEGATIVE NEGATIVE   Leukocytes, UA TRACE (A) NEGATIVE    Labs Reviewed  CBC WITH DIFFERENTIAL/PLATELET - Abnormal; Notable for the following components:      Result Value   WBC 10.9 (*)    RBC 3.85 (*)    Hemoglobin 11.9 (*)    HCT 34.8 (*)    All other components within normal limits  POCT I-STAT, CHEM 8 - Abnormal; Notable for the following components:   Sodium 129 (*)    Chloride 93 (*)    Glucose, Bld 134 (*)    All other components within normal limits  POCT URINALYSIS DIP (DEVICE) - Abnormal; Notable for the following components:   Glucose, UA 100 (*)    Bilirubin Urine SMALL (*)    Protein, ur 30 (*)    Leukocytes, UA TRACE (*)    All other components within normal limits  URINE CULTURE    No results found.     ASSESSMENT & PLAN:  1. Lower urinary tract infectious disease   2. Acute pharyngitis, unspecified etiology   3. Purpura ([place])   4. Sprain of right rotator cuff capsule, subsequent encounter   There is some evidence that you have a urinary tract infection. Therefore we are starting you on some antibiotics. We are also running a urine culture to better understand whether the infection is coming from the bladder.  He will need to have a follow-up exam with Dr. Loanne Drilling next week to make sure the infection is completely cleared and your symptoms are resolved.  I am refilling your mouthwash to help with the burning in her mouth.  Meds ordered this encounter  Medications  . chlorhexidine (PERIDEX) 0.12 %  solution    Sig: Use as directed 15 mLs in the mouth or throat 2 (two) times daily. Swish and spit    Dispense:  120 mL    Refill:  3  . cephALEXin (KEFLEX) 500 MG capsule    Sig: Take 1 capsule (500 mg total) by mouth 4 (four) times daily.    Dispense:  20 capsule    Refill:  0    Reviewed expectations re: course of current medical issues. Questions answered. Outlined signs and symptoms indicating need for more acute intervention. Patient verbalized understanding. After Visit Summary given.    Procedures:      Robyn Haber, MD 06/17/17 1423    Robyn Haber, MD 06/17/17 1450

## 2017-06-17 NOTE — ED Triage Notes (Addendum)
Pt describes chronic health problems.  Over past week c/o weakness, fevers ranging 100-104, "feeling like I'm going to pass out at times".  Has ongoing right abd pain.  Discussed with Dr Joseph Art.

## 2017-06-18 LAB — URINE CULTURE: Culture: NO GROWTH

## 2017-06-19 ENCOUNTER — Other Ambulatory Visit: Payer: Self-pay

## 2017-06-19 ENCOUNTER — Telehealth: Payer: Self-pay | Admitting: Endocrinology

## 2017-06-19 MED ORDER — FLUTICASONE PROPIONATE HFA 110 MCG/ACT IN AERO: 2.0000 | INHALATION_SPRAY | Freq: Two times a day (BID) | RESPIRATORY_TRACT | 0 refills | Status: DC

## 2017-06-19 NOTE — Telephone Encounter (Signed)
I have sent to patient's pharmacy electronically.

## 2017-06-19 NOTE — Telephone Encounter (Signed)
[facility] calling # [phone]  They needs a refill called in for the pts flovent inhaler please

## 2017-06-26 ENCOUNTER — Ambulatory Visit (HOSPITAL_COMMUNITY)
Admission: EM | Admit: 2017-06-26 | Discharge: 2017-06-26 | Disposition: A | Discharge status: Home / Self Care | Payer: Medicare Other | Attending: Internal Medicine | Admitting: Internal Medicine

## 2017-06-26 ENCOUNTER — Encounter (HOSPITAL_COMMUNITY): Payer: Self-pay | Admitting: Emergency Medicine

## 2017-06-26 ENCOUNTER — Other Ambulatory Visit: Payer: Self-pay

## 2017-06-26 DIAGNOSIS — Z86711 Personal history of pulmonary embolism: Secondary | ICD-10-CM | POA: Diagnosis not present

## 2017-06-26 DIAGNOSIS — E7800 Pure hypercholesterolemia, unspecified: Secondary | ICD-10-CM | POA: Insufficient documentation

## 2017-06-26 DIAGNOSIS — R1030 Lower abdominal pain, unspecified: Secondary | ICD-10-CM | POA: Diagnosis not present

## 2017-06-26 DIAGNOSIS — Z888 Allergy status to other drugs, medicaments and biological substances status: Secondary | ICD-10-CM | POA: Insufficient documentation

## 2017-06-26 DIAGNOSIS — Z79890 Hormone replacement therapy (postmenopausal): Secondary | ICD-10-CM | POA: Diagnosis not present

## 2017-06-26 DIAGNOSIS — R531 Weakness: Secondary | ICD-10-CM | POA: Diagnosis not present

## 2017-06-26 DIAGNOSIS — D509 Iron deficiency anemia, unspecified: Secondary | ICD-10-CM | POA: Insufficient documentation

## 2017-06-26 DIAGNOSIS — Z883 Allergy status to other anti-infective agents status: Secondary | ICD-10-CM | POA: Diagnosis not present

## 2017-06-26 DIAGNOSIS — E039 Hypothyroidism, unspecified: Secondary | ICD-10-CM | POA: Insufficient documentation

## 2017-06-26 DIAGNOSIS — Z90710 Acquired absence of both cervix and uterus: Secondary | ICD-10-CM | POA: Insufficient documentation

## 2017-06-26 DIAGNOSIS — Z881 Allergy status to other antibiotic agents status: Secondary | ICD-10-CM | POA: Insufficient documentation

## 2017-06-26 DIAGNOSIS — K219 Gastro-esophageal reflux disease without esophagitis: Secondary | ICD-10-CM | POA: Insufficient documentation

## 2017-06-26 DIAGNOSIS — M546 Pain in thoracic spine: Secondary | ICD-10-CM | POA: Diagnosis not present

## 2017-06-26 DIAGNOSIS — I10 Essential (primary) hypertension: Secondary | ICD-10-CM | POA: Insufficient documentation

## 2017-06-26 DIAGNOSIS — J449 Chronic obstructive pulmonary disease, unspecified: Secondary | ICD-10-CM | POA: Insufficient documentation

## 2017-06-26 DIAGNOSIS — Z79899 Other long term (current) drug therapy: Secondary | ICD-10-CM | POA: Diagnosis not present

## 2017-06-26 DIAGNOSIS — Z87442 Personal history of urinary calculi: Secondary | ICD-10-CM | POA: Diagnosis not present

## 2017-06-26 DIAGNOSIS — R3915 Urgency of urination: Secondary | ICD-10-CM

## 2017-06-26 DIAGNOSIS — M549 Dorsalgia, unspecified: Secondary | ICD-10-CM | POA: Diagnosis not present

## 2017-06-26 DIAGNOSIS — Z9049 Acquired absence of other specified parts of digestive tract: Secondary | ICD-10-CM | POA: Insufficient documentation

## 2017-06-26 LAB — POCT URINALYSIS DIP (DEVICE)
BILIRUBIN URINE: NEGATIVE
Glucose, UA: NEGATIVE mg/dL
Hgb urine dipstick: NEGATIVE
KETONES UR: NEGATIVE mg/dL
LEUKOCYTES UA: NEGATIVE
NITRITE: NEGATIVE
PH: 7 (ref 5.0–8.0)
Protein, ur: NEGATIVE mg/dL
Specific Gravity, Urine: 1.01 (ref 1.005–1.030)
Urobilinogen, UA: 0.2 mg/dL (ref 0.0–1.0)

## 2017-06-26 NOTE — ED Provider Notes (Signed)
[place]    CSN: [id number] Arrival date & time: 06/26/17  1400     History   Chief Complaint Chief Complaint  Patient presents with  . Recurrent UTI    HPI [name] is a 83 y.o. female.   She presents today with a six-month history of pain in the mid back and lower abdomen, worse today.  She also has some urinary urgency.  She and her son both endorse weakness, having difficulty for the last several months doing her usual activities.  Some dry cough.  Recently was treated for possible urinary tract infection with Keflex, which did not resolve symptoms.  Urine culture turned out to be negative.  No fever. No dysuria, no urinary incontinence but has been worried that she would have incontinence, having urgency. No change in bowel habits except little bit of softer/paler stool while on Keflex.  Not vomiting.   HPI  Past Medical History:  Diagnosis Date  . ABDOMINAL PAIN, CHRONIC 05/28/2008  . ALLERGIC RHINITIS 09/21/2006  . ANEMIA-NOS 09/21/2006  . ASTHMA 09/21/2006  . ASYMPTOMATIC POSTMENOPAUSAL STATUS 05/28/2008  . Cataract   . CHEST PAIN, ATYPICAL 05/28/2008  . COPD (chronic obstructive pulmonary disease) ([place])   . DDD (degenerative disc disease)   . DIVERTICULITIS, HX OF 09/21/2006  . GERD 09/21/2006  . HYPERCHOLESTEROLEMIA 06/05/2009  . HYPERTENSION 09/21/2006  . HYPOTHYROIDISM 09/21/2006  . Peptic ulcer disease   . Purpura ([place])   . RENAL INSUFFICIENCY 09/21/2006  . Shingles   . Small bowel obstruction ([place])   . Somatization disorder 04/25/2007  . UNSPECIFIED URINARY CALCULUS 05/28/2008    Patient Active Problem List   Diagnosis Date Noted  . Osteoporosis 11/21/2016  . Postmenopausal status 09/06/2016  . Other nonspecific abnormal finding of lung field 08/14/2015  . Hyponatremia 07/21/2014  . Menopausal state 07/18/2014  . Hematest positive stools 08/05/2013  . Anemia due to other cause 08/05/2013  . Cerumen impaction 06/03/2013  . History of adrenal  disorder 10/15/2012  . Rash and nonspecific skin eruption 10/15/2012  . Iron deficiency anemia 06/20/2012  . Psychogenic polydipsia 06/20/2012  . Nonspecific abnormal electrocardiogram (ECG) (EKG) 06/20/2012  . Polyuria 05/02/2012  . Pelvic pain in female 08/16/2011  . Leg ulcer ([place]) 06/16/2010  . Routine general medical examination at a health care facility 06/11/2010  . Encounter for long-term (current) use of other medications 06/11/2010  . HYPERCHOLESTEROLEMIA 06/05/2009  . UNSPECIFIED URINARY CALCULUS 05/28/2008  . CHEST PAIN, ATYPICAL 05/28/2008  . ABDOMINAL PAIN, CHRONIC 05/28/2008  . ASYMPTOMATIC POSTMENOPAUSAL STATUS 05/28/2008  . SOMATIZATION DISORDER 04/25/2007  . Hypothyroidism 09/21/2006  . Essential hypertension 09/21/2006  . ALLERGIC RHINITIS 09/21/2006  . ASTHMA 09/21/2006  . GERD 09/21/2006  . Disorder resulting from impaired renal function 09/21/2006  . DIVERTICULITIS, HX OF 09/21/2006    Past Surgical History:  Procedure Laterality Date  . ABDOMINAL HYSTERECTOMY  1981  . acute Nephritis  1958  . APPENDECTOMY  1959  . Asthma & Pneumonia Birth  1970's  . CESAREAN SECTION    . CHOLECYSTECTOMY  1983  . COLON SURGERY    . DENTAL SURGERY  1970  . NASAL SINUS SURGERY  1967  . Pulmonary thrombosis    . TONSILLECTOMY    . TUBAL LIGATION      Home Medications    Prior to Admission medications   Medication Sig Start Date End Date Taking? Authorizing Provider  Ferrous Sulfate (IRON) 325 (65 FE) MG TABS Take 1 tablet by mouth  daily.     [provider]  fluticasone (FLOVENT HFA) 110 MCG/ACT inhaler Inhale 2 puffs into the lungs 2 (two) times daily. 06/19/17   Renato Shin, MD  levothyroxine (SYNTHROID, LEVOTHROID) 75 MCG tablet Take 1 tablet (75 mcg total) by mouth daily. 11/21/16   Renato Shin, MD    Family History Family History  Problem Relation Age of Onset  . Cancer Mother   . Cancer Father     Social History Social History   Tobacco  Use  . Smoking status: Never Smoker  . Smokeless tobacco: Never Used  Substance Use Topics  . Alcohol use: No  . Drug use: No     Allergies   Fluconazole; Metronidazole; Macrobid [nitrofurantoin]; and Pepcid [famotidine]   Review of Systems Review of Systems  All other systems reviewed and are negative.    Physical Exam Triage Vital Signs ED Triage Vitals  Enc Vitals Group     BP 06/26/17 1532 122/82     Pulse Rate 06/26/17 1532 81     Resp 06/26/17 1532 (!) 22     Temp 06/26/17 1532 98.5 F (36.9 C)     Temp Source 06/26/17 1532 Oral     SpO2 06/26/17 1532 99 %     Weight --      Height --      Pain Score 06/26/17 1529 8     Pain Loc --    Updated Vital Signs BP 122/82 (BP Location: Right Arm)   Pulse 81   Temp 98.5 F (36.9 C) (Oral)   Resp (!) 22   SpO2 99%  Physical Exam  Constitutional: She is oriented to person, place, and time. No distress.  HENT:  Head: Atraumatic.  Eyes:  Conjugate gaze observed, no eye redness/discharge  Neck: Neck supple.  Cardiovascular: Normal rate and regular rhythm.  Pulmonary/Chest: No respiratory distress. She has no wheezes. She has no rales.  No breath sounds appreciated posteriorly, extremely diminished but symmetric.  Splinting mildly, appears chronic.  Speaking in complete sentences.  Not cyanotic  Abdominal: Soft. She exhibits no distension. There is no rebound and no guarding.  No focal tenderness, has global mild discomfort to palpation  Musculoskeletal: Normal range of motion.  Neurological: She is alert and oriented to person, place, and time.  Seated in a push chair, but was able to/preferred to walk out of the facility with her cane  Skin: Skin is warm and dry.  Nursing note and vitals reviewed.    UC Treatments / Results  Labs Results for orders placed or performed during the hospital encounter of 06/26/17  POCT urinalysis dip (device)  Result Value Ref Range   Glucose, UA NEGATIVE NEGATIVE mg/dL    Bilirubin Urine NEGATIVE NEGATIVE   Ketones, ur NEGATIVE NEGATIVE mg/dL   Specific Gravity, Urine 1.010 1.005 - 1.030   Hgb urine dipstick NEGATIVE NEGATIVE   pH 7.0 5.0 - 8.0   Protein, ur NEGATIVE NEGATIVE mg/dL   Urobilinogen, UA 0.2 0.0 - 1.0 mg/dL   Nitrite NEGATIVE NEGATIVE   Leukocytes, UA NEGATIVE NEGATIVE    EKG None  Radiology No results found.  Procedures Procedures (including critical care time) None today  Medications Ordered in UC Medications - No data to display  Final Clinical Impressions(s) / UC Diagnoses   Final diagnoses:  Urinary urgency  Lower abdominal pain  Mid back pain  Weakness     Discharge Instructions     Urine test at the urgent care was  normal today.  Urine culture is pending.  The urgent care will contact you if further treatment is needed for urine culture.  Urine culture at the last urgent care visit 5/11 was negative for UTI.  Unclear source of lower abdominal and mid back pain, urinary urgency: many possible causes including bowel issues(diverticulitis, constipation), chafing; irritation from hygiene product; other pelvic infection (yeast, bacterial vaginosis) or STD; dietary cause (caffeine); low estrogen effect; kidney stone passage; or interstitial cystitis; cannot completely rule out blood flow issue to bowel but not having 'red flag' symptoms like fever/bloody stools.  Agree with continuing good hydration and diet.  Please followup with your primary care provider to discuss next steps if symptoms are not improving.     ED Prescriptions    None       Wynona Luna, MD 06/27/17 2101

## 2017-06-26 NOTE — Discharge Instructions (Signed)
Urine test at the urgent care was normal today.  Urine culture is pending.  The urgent care will contact you if further treatment is needed for urine culture.  Urine culture at the last urgent care visit 5/11 was negative for UTI.  Unclear source of lower abdominal and mid back pain, urinary urgency: many possible causes including bowel issues(diverticulitis, constipation), chafing; irritation from hygiene product; other pelvic infection (yeast, bacterial vaginosis) or STD; dietary cause (caffeine); low estrogen effect; kidney stone passage; or interstitial cystitis; cannot completely rule out blood flow issue to bowel but not having 'red flag' symptoms like fever/bloody stools.  Agree with continuing good hydration and diet.  Please followup with your primary care provider to discuss next steps if symptoms are not improving.

## 2017-06-26 NOTE — ED Triage Notes (Addendum)
Seen 5/11 patient was seen for kidney infection and treated.  Reports no improvement.    Patient has pain with urination, low abdominal pain.    Chronic issues

## 2017-06-28 LAB — URINE CULTURE: Culture: NO GROWTH

## 2017-06-30 ENCOUNTER — Telehealth (HOSPITAL_COMMUNITY): Payer: Self-pay

## 2017-06-30 NOTE — Telephone Encounter (Signed)
Attempted to reach patient regarding normal results. No answer at this time.

## 2017-07-11 ENCOUNTER — Other Ambulatory Visit: Payer: Self-pay | Admitting: Endocrinology

## 2017-07-24 ENCOUNTER — Encounter: Payer: Self-pay | Admitting: Family Medicine

## 2017-07-24 DIAGNOSIS — R102 Pelvic and perineal pain: Secondary | ICD-10-CM | POA: Diagnosis not present

## 2017-07-30 ENCOUNTER — Other Ambulatory Visit: Payer: Self-pay | Admitting: Endocrinology

## 2017-08-12 ENCOUNTER — Other Ambulatory Visit: Payer: Self-pay | Admitting: Endocrinology

## 2017-08-15 ENCOUNTER — Ambulatory Visit: Payer: Self-pay | Admitting: Endocrinology

## 2017-08-21 ENCOUNTER — Encounter: Payer: Self-pay | Admitting: Endocrinology

## 2017-08-21 ENCOUNTER — Ambulatory Visit (INDEPENDENT_AMBULATORY_CARE_PROVIDER_SITE_OTHER): Payer: Medicare Other | Admitting: Endocrinology

## 2017-08-21 VITALS — BP 138/78 | HR 87 | Wt 138.2 lb

## 2017-08-21 DIAGNOSIS — M2550 Pain in unspecified joint: Secondary | ICD-10-CM | POA: Insufficient documentation

## 2017-08-21 DIAGNOSIS — E039 Hypothyroidism, unspecified: Secondary | ICD-10-CM | POA: Diagnosis not present

## 2017-08-21 DIAGNOSIS — D509 Iron deficiency anemia, unspecified: Secondary | ICD-10-CM | POA: Diagnosis not present

## 2017-08-21 DIAGNOSIS — E871 Hypo-osmolality and hyponatremia: Secondary | ICD-10-CM | POA: Diagnosis not present

## 2017-08-21 LAB — CBC WITH DIFFERENTIAL/PLATELET
Basophils Absolute: 0.1 10*3/uL (ref 0.0–0.1)
Basophils Relative: 1.2 % (ref 0.0–3.0)
EOS PCT: 6.7 % — AB (ref 0.0–5.0)
Eosinophils Absolute: 0.5 10*3/uL (ref 0.0–0.7)
HCT: 35.9 % — ABNORMAL LOW (ref 36.0–46.0)
HEMOGLOBIN: 12.1 g/dL (ref 12.0–15.0)
LYMPHS ABS: 2 10*3/uL (ref 0.7–4.0)
Lymphocytes Relative: 27.7 % (ref 12.0–46.0)
MCHC: 33.7 g/dL (ref 30.0–36.0)
MCV: 94.4 fl (ref 78.0–100.0)
MONO ABS: 0.9 10*3/uL (ref 0.1–1.0)
MONOS PCT: 12.2 % — AB (ref 3.0–12.0)
NEUTROS PCT: 52.2 % (ref 43.0–77.0)
Neutro Abs: 3.8 10*3/uL (ref 1.4–7.7)
Platelets: 299 10*3/uL (ref 150.0–400.0)
RBC: 3.8 Mil/uL — AB (ref 3.87–5.11)
RDW: 14.9 % (ref 11.5–15.5)
WBC: 7.2 10*3/uL (ref 4.0–10.5)

## 2017-08-21 LAB — TSH: TSH: 4.35 u[IU]/mL (ref 0.35–4.50)

## 2017-08-21 LAB — BASIC METABOLIC PANEL
BUN: 10 mg/dL (ref 6–23)
CALCIUM: 9.7 mg/dL (ref 8.4–10.5)
CO2: 27 meq/L (ref 19–32)
CREATININE: 0.67 mg/dL (ref 0.40–1.20)
Chloride: 98 mEq/L (ref 96–112)
GFR: 89.71 mL/min (ref >60.00)
Glucose, Bld: 125 mg/dL — ABNORMAL HIGH (ref 70–99)
Potassium: 4.1 mEq/L (ref 3.5–5.1)
Sodium: 132 mEq/L — ABNORMAL LOW (ref 135–145)

## 2017-08-21 LAB — SEDIMENTATION RATE: SED RATE: 24 mm/h (ref 0–30)

## 2017-08-21 LAB — IBC PANEL
IRON: 48 ug/dL (ref 42–145)
SATURATION RATIOS: 16.9 % — AB (ref 20.0–50.0)
Transferrin: 203 mg/dL — ABNORMAL LOW (ref 212.0–360.0)

## 2017-08-21 MED ORDER — FLUTICASONE PROPIONATE HFA 110 MCG/ACT IN AERO: 1.0000 | INHALATION_SPRAY | Freq: Two times a day (BID) | RESPIRATORY_TRACT | 5 refills | Status: DC

## 2017-08-21 NOTE — Progress Notes (Signed)
Subjective:    Patient ID: [name], female    DOB: Jan 25, 1935, 83 y.o.   MRN: [id number]  HPI Pt reports 6 mos of moderate "sores in the mouth," and assoc arthralgias.  She takes fe, 1/d.   Past Medical History:  Diagnosis Date  . ABDOMINAL PAIN, CHRONIC 05/28/2008  . ALLERGIC RHINITIS 09/21/2006  . ANEMIA-NOS 09/21/2006  . ASTHMA 09/21/2006  . ASYMPTOMATIC POSTMENOPAUSAL STATUS 05/28/2008  . Cataract   . CHEST PAIN, ATYPICAL 05/28/2008  . COPD (chronic obstructive pulmonary disease) ([place])   . DDD (degenerative disc disease)   . DIVERTICULITIS, HX OF 09/21/2006  . GERD 09/21/2006  . HYPERCHOLESTEROLEMIA 06/05/2009  . HYPERTENSION 09/21/2006  . HYPOTHYROIDISM 09/21/2006  . Peptic ulcer disease   . Purpura ([place])   . RENAL INSUFFICIENCY 09/21/2006  . Shingles   . Small bowel obstruction ([place])   . Somatization disorder 04/25/2007  . UNSPECIFIED URINARY CALCULUS 05/28/2008    Past Surgical History:  Procedure Laterality Date  . ABDOMINAL HYSTERECTOMY  1981  . acute Nephritis  1958  . APPENDECTOMY  1959  . Asthma & Pneumonia Birth  1970's  . CESAREAN SECTION    . CHOLECYSTECTOMY  1983  . COLON SURGERY    . DENTAL SURGERY  1970  . NASAL SINUS SURGERY  1967  . Pulmonary thrombosis    . TONSILLECTOMY    . TUBAL LIGATION      Social History   Socioeconomic History  . Marital status: Divorced    Spouse name: Not on file  . Number of children: Not on file  . Years of education: Not on file  . Highest education level: Not on file  Occupational History  . Not on file  Social Needs  . Financial resource strain: Not on file  . Food insecurity:    Worry: Not on file    Inability: Not on file  . Transportation needs:    Medical: Not on file    Non-medical: Not on file  Tobacco Use  . Smoking status: Never Smoker  . Smokeless tobacco: Never Used  Substance and Sexual Activity  . Alcohol use: No  . Drug use: No  . Sexual activity: Never  Lifestyle  . Physical activity:    Days per week: Not on file    Minutes per session: Not on file  . Stress: Not on file  Relationships  . Social connections:    Talks on phone: Not on file    Gets together: Not on file    Attends religious service: Not on file    Active member of club or organization: Not on file    Attends meetings of clubs or organizations: Not on file    Relationship status: Not on file  . Intimate partner violence:    Fear of current or ex partner: Not on file    Emotionally abused: Not on file    Physically abused: Not on file    Forced sexual activity: Not on file  Other Topics Concern  . Not on file  Social History Narrative  . Not on file    Current Outpatient Medications on File Prior to Visit  Medication Sig Dispense Refill  . Ferrous Sulfate (IRON) 325 (65 FE) MG TABS Take 1 tablet by mouth daily.     Marland Kitchen levothyroxine (SYNTHROID, LEVOTHROID) 50 MCG tablet TAKE 1 TABLET (50 MCG TOTAL) BY MOUTH DAILY BEFORE BREAKFAST. 90 tablet 3   No current facility-administered medications on file prior to  visit.     Allergies  Allergen Reactions  . Fluconazole Shortness Of Breath  . Metronidazole Shortness Of Breath and Nausea And Vomiting  . Macrobid [Nitrofurantoin]     REACTION: Syncope  . Pepcid [Famotidine]     Family History  Problem Relation Age of Onset  . Cancer Mother   . Cancer Father     BP 138/78 (BP Location: Left Arm, Patient Position: Sitting, Cuff Size: Normal)   Pulse 87   Wt 138 lb 3.2 oz (62.7 kg)   SpO2 95%   BMI 23.00 kg/m   Review of Systems Denies headache and nausea.      Objective:   Physical Exam VITAL SIGNS:  See vs page GENERAL: no distress head: no deformity  eyes: no periorbital swelling, no proptosis  external nose and ears are normal  mouth: no lesion seen now.   Gait: steady, with a cane.        Assessment & Plan:  Oral ulcers.  I told pt I don't know what else I can do for this. Anemia: recheck today Hypothyroidism: recheck  today Hyponatremia: recheck today  Patient Instructions  blood tests are requested for you today.  We'll let you know about the results.   Please continue the same medications.  Please come back for a follow-up appointment in 3 months.

## 2017-08-21 NOTE — Patient Instructions (Addendum)
blood tests are requested for you today.  We'll let you know about the results.  Please continue the same medications.  Please come back for a follow-up appointment in 3 months.   

## 2017-11-08 ENCOUNTER — Encounter: Payer: Self-pay | Admitting: Family Medicine

## 2017-11-08 ENCOUNTER — Ambulatory Visit (INDEPENDENT_AMBULATORY_CARE_PROVIDER_SITE_OTHER): Payer: Medicare Other | Admitting: Family Medicine

## 2017-11-08 VITALS — BP 122/80 | HR 93 | Temp 98.3°F | Ht 63.75 in | Wt 139.3 lb

## 2017-11-08 DIAGNOSIS — D500 Iron deficiency anemia secondary to blood loss (chronic): Secondary | ICD-10-CM

## 2017-11-08 DIAGNOSIS — Z23 Encounter for immunization: Secondary | ICD-10-CM

## 2017-11-08 DIAGNOSIS — J4540 Moderate persistent asthma, uncomplicated: Secondary | ICD-10-CM | POA: Diagnosis not present

## 2017-11-08 DIAGNOSIS — N23 Unspecified renal colic: Secondary | ICD-10-CM | POA: Diagnosis not present

## 2017-11-08 DIAGNOSIS — D692 Other nonthrombocytopenic purpura: Secondary | ICD-10-CM

## 2017-11-08 LAB — POCT URINALYSIS DIPSTICK
Bilirubin, UA: NEGATIVE
GLUCOSE UA: NEGATIVE
KETONES UA: NEGATIVE
Leukocytes, UA: NEGATIVE
Nitrite, UA: NEGATIVE
Protein, UA: NEGATIVE
RBC UA: NEGATIVE
SPEC GRAV UA: 1.015 (ref 1.010–1.025)
Urobilinogen, UA: 0.2 E.U./dL
pH, UA: 7 (ref 5.0–8.0)

## 2017-11-08 MED ORDER — ALBUTEROL SULFATE HFA 108 (90 BASE) MCG/ACT IN AERS: 2.0000 | INHALATION_SPRAY | RESPIRATORY_TRACT | 2 refills | Status: DC | PRN

## 2017-11-08 MED ORDER — E-Z SPACER DEVI: 2 refills | Status: DC

## 2017-11-08 NOTE — Addendum Note (Signed)
Addended by: Rene Kocher on: 11/08/2017 03:29 PM   Modules accepted: Orders

## 2017-11-08 NOTE — Progress Notes (Signed)
[name] DOB: 01/14/1935 Encounter date: 11/08/2017  This is a 83 y.o. female who presents to establish care. Chief Complaint  Patient presents with  . Transitions Of Care    right "kidney" pain, keeping her up at night, flu shot   Patient of Dr. Loanne Drilling last seen in July 2019. This office is much more convenient for her.   History of present illness: She states she has had a lot of medical issues in the past; and hasn't always felt like there was a definitive diagnosis for these. She was diagnosed with purpura by dermatology (not aware of any more specific diagnosis) and feels that this condition causes many of her symptoms which include leg pain, leg/arm purpura, intestinal bleeding. In addition, she received a steroid shot in her shoulder for rotator cuff issues back in January and feels this triggered a worsening of multiple medical complaints as well as mouth blistering, dry lips, dry mucous membranes, dry eyes.   WFU:XNAT controlled  Hypothyroid: well controlled GERD: diet controlled. Has some symptoms after lunch. Can't tolerate medications. States almost died from pepcid, protonix.  Allergic rhinitis: Tolerates the allergies. Just lives with symtpoms and states they are mild. Really always had these symptoms.  Osteoporosis: States she isn't doing anything for this. Won't take fosamax due to the reflux. Doesn't want to try any injections. Asthma:sx controlled with flovent, but would like to not use this because worries about it worsening current dry mouth/mouth symptoms. Feels she would end up in exacerbation if not using.  Hyperlipidemia:diet controlled.   Still getting blistering in mouth. Has used mouth gargles, rinses. States dentist has no further suggestions. Did not get significant relief from other mouth gargles she has tried (not certain all names). Follows specific diet for gastroparesis, so all foods are pureed and mouth soreness limits her already limited diet.   Right  rotator cuff didn't heal up as expected. Worries that there is ongoing infection in this shoulder. Takes 1.5 tylenol daily. Limits use due to father being allergic to tylenol.   Bleeding from intestines, foot pain, shoulder pain. Gastroparesis diet is meat free and limited. She feels like she isn't getting enough sometimes; but does add on vitamins.   Wondering if there is anything additional she can do with shoulder. Trying to keep lose by doing exercises regularly.   Foot pain. Legs hurt. Purpura started on legs. Took years to diagnose. Has pain deep in calves and feet. Hurts to bear weight.   Ended up getting partial colon resection after colonoscopy; since that time has had bleeding in stools. Had follow up colonoscopy. Since that time has been diagnosed with the purpura. Always has a hard time maintaining iron levels/hemoglobin.   Had topical treatment from dermatologist that caused allergic reaction. She doesn't remember name of this. States it was compounded in drug store. States that in a week of using it; she was nearly in a coma and couldn't even move. States it went into body and affected kidneys. Is very cautious about what medications she will try due to this and other sensitivities.  Current kidney pain. States that she had this back in July and was told she had a kidney infection and treated at urgent care. Feels like muscle spasm; localizes in kidney area. Feels dry urinating; gets some painful twinges lower abdomen. Urinates freely; looks clear. States she has had fevers/chills (states normal is 96.3; states it has been up to 98.3). No vomiting. Has seen urology in the past (in  January); states that she had a very thorough work up with followup and they did not see anything specific.   Past Medical History:  Diagnosis Date  . ABDOMINAL PAIN, CHRONIC 05/28/2008  . ALLERGIC RHINITIS 09/21/2006  . ANEMIA-NOS 09/21/2006  . ASTHMA 09/21/2006  . ASYMPTOMATIC POSTMENOPAUSAL STATUS 05/28/2008   . Cataract   . CHEST PAIN, ATYPICAL 05/28/2008  . COPD (chronic obstructive pulmonary disease) ([place])   . DDD (degenerative disc disease)   . DIVERTICULITIS, HX OF 09/21/2006  . GERD 09/21/2006  . HYPERCHOLESTEROLEMIA 06/05/2009  . HYPERTENSION 09/21/2006  . HYPOTHYROIDISM 09/21/2006  . Peptic ulcer disease   . Purpura ([place])   . RENAL INSUFFICIENCY 09/21/2006  . Shingles   . Small bowel obstruction ([place])   . Somatization disorder 04/25/2007  . UNSPECIFIED URINARY CALCULUS 05/28/2008   Past Surgical History:  Procedure Laterality Date  . ABDOMINAL HYSTERECTOMY  1981  . acute Nephritis  1958  . APPENDECTOMY  1959  . Asthma & Pneumonia Birth  1970's  . CESAREAN SECTION    . CHOLECYSTECTOMY  1983  . COLON SURGERY    . DENTAL SURGERY  1970  . NASAL SINUS SURGERY  1967  . Pulmonary thrombosis    . TONSILLECTOMY    . TUBAL LIGATION     Allergies  Allergen Reactions  . Fluconazole Shortness Of Breath  . Metronidazole Shortness Of Breath and Nausea And Vomiting  . Macrobid [Nitrofurantoin]     REACTION: Syncope  . Pepcid [Famotidine]   . Protonix [Pantoprazole Sodium]    Current Meds  Medication Sig  . Ferrous Sulfate (IRON) 325 (65 FE) MG TABS Take 1 tablet by mouth daily.   . fluticasone (FLOVENT HFA) 110 MCG/ACT inhaler Inhale 1 puff into the lungs 2 (two) times daily.  Marland Kitchen levothyroxine (SYNTHROID, LEVOTHROID) 50 MCG tablet TAKE 1 TABLET (50 MCG TOTAL) BY MOUTH DAILY BEFORE BREAKFAST.   Social History   Tobacco Use  . Smoking status: Never Smoker  . Smokeless tobacco: Never Used  Substance Use Topics  . Alcohol use: No   Family History  Problem Relation Age of Onset  . Cancer Mother        pancreatic  . Polycystic kidney disease Mother   . Cancer Father        bladder  . Other Father        Schamberg disease  . Marfan syndrome Son   . Hemochromatosis Son   . Cirrhosis Son   . Allergic rhinitis Sister   . Other Brother        bone issue as child; multiple  fractures but seemed to age out of this  . Hemochromatosis Cousin   . Arthritis Sister      Review of Systems  Constitutional: Negative for appetite change, fever (not true fever; temp elevated per patient) and unexpected weight change.  Respiratory: Negative for chest tightness and shortness of breath.   Cardiovascular: Negative for chest pain and leg swelling.  Gastrointestinal: Positive for abdominal pain (feels that "kidney pain" has been pretty consistent since January. Sometimes worse than others.). Negative for constipation, diarrhea, nausea and vomiting.  Genitourinary: Positive for frequency. Negative for difficulty urinating.       Vaginal dryness   Musculoskeletal: Positive for arthralgias (right shoulder is worst).  Skin:       Chronic rash from purpura  Allergic/Immunologic: Positive for environmental allergies.    Objective:  BP 122/80 (BP Location: Left Arm, Patient Position: Sitting, Cuff Size: Normal)  Pulse 93   Temp 98.3 F (36.8 C) (Oral)   Ht 5' 3.75" (1.619 m)   Wt 139 lb 4.8 oz (63.2 kg)   SpO2 98%   BMI 24.10 kg/m   Weight: 139 lb 4.8 oz (63.2 kg)   BP Readings from Last 3 Encounters:  11/08/17 122/80  08/21/17 138/78  06/26/17 122/82   Wt Readings from Last 3 Encounters:  11/08/17 139 lb 4.8 oz (63.2 kg)  08/21/17 138 lb 3.2 oz (62.7 kg)  02/23/17 129 lb 3.2 oz (58.6 kg)    Physical Exam  Constitutional: She is oriented to person, place, and time. She appears well-developed and well-nourished. No distress.  Cardiovascular: Normal rate, regular rhythm and normal heart sounds. Exam reveals no friction rub.  No murmur heard. No lower extremity edema  Pulmonary/Chest: Effort normal and breath sounds normal. No respiratory distress. She has no wheezes. She has no rales.  Abdominal: Soft. Bowel sounds are normal. She exhibits no distension. There is no hepatosplenomegaly. There is no tenderness. There is CVA tenderness (slight on right; states this  is baseline since Jan). There is no guarding, no tenderness at McBurney's point and negative Murphy's sign.  Neurological: She is alert and oriented to person, place, and time.  Psychiatric: Her behavior is normal. Cognition and memory are normal.    Assessment/Plan: 1. Kidney pain Unable to urinate in office; collection supplies given for her to complete at home. - POCT urinalysis dipstick; Future  2. Moderate persistent asthma, unspecified whether complicated Controlled; continue current flovent. Albuterol sent in for use if needed. Recommend spacing chamber for inhalers. - albuterol (PROAIR HFA) 108 (90 Base) MCG/ACT inhaler; Inhale 2 puffs into the lungs every 4 (four) hours as needed for wheezing or shortness of breath.  Dispense: 1 Inhaler; Refill: 2 - Spacer/Aero-Holding Chambers (E-Z SPACER) inhaler; Use as instructed  Dispense: 1 each; Refill: 2  3. Need for influenza vaccination  - Flu Vaccine QUAD 36+ mos IM  4. Anemia, blood loss Will review previous specialty notes, labs and will get back in touch with her regarding follow up.  5. Purpura ([place]) See above; She has a very complex history with multiple medical conditions and active concerns. I would like to review all specialty notes including derm, ortho, GI and then determine best plan of action for her care.    Return pending record review/UA results.  Micheline Rough, MD

## 2017-11-21 ENCOUNTER — Ambulatory Visit: Payer: Self-pay | Admitting: Endocrinology

## 2017-11-24 ENCOUNTER — Telehealth: Payer: Self-pay | Admitting: Family Medicine

## 2017-11-24 NOTE — Telephone Encounter (Signed)
Please let [name] know I did review her records. I would like to see notes from the last couple years from dermatology, GI (if seen in last year), urology, and PCP as I did not see these. Also bloodwork that she has had done in the last couple of years? May need to come in to sign releases to get these. Thanks!

## 2017-11-27 NOTE — Telephone Encounter (Signed)
Spoke with patient she sated she would try to stop by this week.

## 2017-11-27 NOTE — Telephone Encounter (Signed)
Noted  

## 2017-11-29 ENCOUNTER — Telehealth: Payer: Self-pay | Admitting: *Deleted

## 2017-11-29 NOTE — Telephone Encounter (Signed)
Copied from [place] [phone]. Topic: General - Other >> Nov 29, 2017  3:16 PM Keene Breath wrote: Reason for CRM: Patient called to ask if she still had to fill out release forms because she is going to become a patient with Dr. Ethlyn Gallery.  Patient stated she received a call and she did not know exactly why.  Please advise and call patient back.  Patient would like for a detailed message to be left on the machine if she is not available.  CB# [phone].  >> Nov 29, 2017  3:32 PM Virl Cagey, CMA wrote: Per previous TE 11/24/17  Caren Macadam, MD 3:01 PM Note   Please let [name] know I did review her records. I would like to see notes from the last couple years from dermatology, GI (if seen in last year), urology, and PCP as I did not see these. Also bloodwork that she has had done in the last couple of years? May need to come in to sign releases to get these. Thanks!

## 2017-11-29 NOTE — Telephone Encounter (Signed)
Pt states that she came by earlier today and she filled out one form (partially) and does not feel like coming back to fill out multiple forms. Spoke with [place] and we are going to mail her multiple record requests to fill out and mail back to our office for [place] profession/Doctor that we need to obtain records from... Ex: Urology, Dermatology, GI, etc...  Nothing further needed.

## 2018-01-03 ENCOUNTER — Encounter: Payer: Self-pay | Admitting: Family Medicine

## 2018-01-03 ENCOUNTER — Ambulatory Visit (INDEPENDENT_AMBULATORY_CARE_PROVIDER_SITE_OTHER): Payer: Medicare Other | Admitting: Family Medicine

## 2018-01-03 VITALS — BP 140/64 | HR 75 | Temp 98.2°F | Wt 140.3 lb

## 2018-01-03 DIAGNOSIS — E039 Hypothyroidism, unspecified: Secondary | ICD-10-CM | POA: Diagnosis not present

## 2018-01-03 DIAGNOSIS — E871 Hypo-osmolality and hyponatremia: Secondary | ICD-10-CM

## 2018-01-03 DIAGNOSIS — R5383 Other fatigue: Secondary | ICD-10-CM

## 2018-01-03 DIAGNOSIS — K146 Glossodynia: Secondary | ICD-10-CM

## 2018-01-03 DIAGNOSIS — D509 Iron deficiency anemia, unspecified: Secondary | ICD-10-CM | POA: Diagnosis not present

## 2018-01-03 DIAGNOSIS — M2550 Pain in unspecified joint: Secondary | ICD-10-CM

## 2018-01-03 DIAGNOSIS — D649 Anemia, unspecified: Secondary | ICD-10-CM

## 2018-01-03 DIAGNOSIS — R682 Dry mouth, unspecified: Secondary | ICD-10-CM

## 2018-01-03 LAB — CBC WITH DIFFERENTIAL/PLATELET
BASOS PCT: 1.3 % (ref 0.0–3.0)
Basophils Absolute: 0.1 10*3/uL (ref 0.0–0.1)
EOS ABS: 0.6 10*3/uL (ref 0.0–0.7)
Eosinophils Relative: 9.5 % — ABNORMAL HIGH (ref 0.0–5.0)
HCT: 36 % (ref 36.0–46.0)
Hemoglobin: 12.4 g/dL (ref 12.0–15.0)
Lymphocytes Relative: 26.6 % (ref 12.0–46.0)
Lymphs Abs: 1.8 10*3/uL (ref 0.7–4.0)
MCHC: 34.3 g/dL (ref 30.0–36.0)
MCV: 96.5 fl (ref 78.0–100.0)
MONO ABS: 0.9 10*3/uL (ref 0.1–1.0)
Monocytes Relative: 12.9 % — ABNORMAL HIGH (ref 3.0–12.0)
NEUTROS ABS: 3.4 10*3/uL (ref 1.4–7.7)
Neutrophils Relative %: 49.7 % (ref 43.0–77.0)
PLATELETS: 290 10*3/uL (ref 150.0–400.0)
RBC: 3.73 Mil/uL — ABNORMAL LOW (ref 3.87–5.11)
RDW: 13.1 % (ref 11.5–15.5)
WBC: 6.8 10*3/uL (ref 4.0–10.5)

## 2018-01-03 LAB — TSH: TSH: 10.07 u[IU]/mL — AB (ref 0.35–4.50)

## 2018-01-03 LAB — FOLATE: Folate: 17.7 ng/mL (ref >5.9)

## 2018-01-03 LAB — VITAMIN B12: VITAMIN B 12: 844 pg/mL (ref 211–911)

## 2018-01-03 LAB — C-REACTIVE PROTEIN: CRP: 0.2 mg/dL — AB (ref 0.5–20.0)

## 2018-01-03 LAB — FERRITIN: FERRITIN: 217.8 ng/mL (ref 10.0–291.0)

## 2018-01-03 LAB — VITAMIN D 25 HYDROXY (VIT D DEFICIENCY, FRACTURES): VITD: 19.64 ng/mL — AB (ref 30.00–100.00)

## 2018-01-03 NOTE — Patient Instructions (Signed)
Consider aquaphor for lips

## 2018-01-03 NOTE — Addendum Note (Signed)
Addended by: Elmer Picker on: 01/03/2018 02:57 PM   Modules accepted: Orders

## 2018-01-03 NOTE — Progress Notes (Signed)
[name] DOB: [birth year] Encounter date: 01/03/2018  This is a 83 y.o. female who presents with Chief Complaint  Patient presents with  . Nasal Congestion    off and on for the last year, has gotten worse over the past month. pt states her mouth is dry, and sore, her hair is falling out, and lips are dry and cracking, drainage, feels like mucus is stuck in her throat but wont come up    History of present illness: Hoarseness, sore throat like someone hit her on [place] apple. Hair loss, worse in last couple of months.   Mouth sore, using mouthwash but not helping. Mouth is very painful, bottom lip feels like it is going to crack. Just wondering if thyroid is out of whack. Wondering if vitamin deficiency contributing. Has been taking vitamin B but hasn't noted improvement. States that mouth started after getting steroid shot for right shoulder and just never left.   Has really felt badly since getting flu shot. Just felt like all her cold, chilled sx haven't left her.   Takes iron vitamin daily; eats beets daily.   Right shoulder is just not well. Didn't get better with therapy. Does exercises daily to help keep it moving.     HPI   Allergies  Allergen Reactions  . Fluconazole Shortness Of Breath  . Metronidazole Shortness Of Breath and Nausea And Vomiting  . Macrobid [Nitrofurantoin]     REACTION: Syncope  . Pepcid [Famotidine]   . Protonix [Pantoprazole Sodium]    Current Meds  Medication Sig  . albuterol (PROAIR HFA) 108 (90 Base) MCG/ACT inhaler Inhale 2 puffs into the lungs every 4 (four) hours as needed for wheezing or shortness of breath.  . Ferrous Sulfate (IRON) 325 (65 FE) MG TABS Take 1 tablet by mouth daily.   . fluticasone (FLOVENT HFA) 110 MCG/ACT inhaler Inhale 1 puff into the lungs 2 (two) times daily.  Marland Kitchen levothyroxine (SYNTHROID, LEVOTHROID) 50 MCG tablet TAKE 1 TABLET (50 MCG TOTAL) BY MOUTH DAILY BEFORE BREAKFAST.  Marland Kitchen Spacer/Aero-Holding Chambers (E-Z  SPACER) inhaler Use as instructed    Review of Systems  Objective:  BP 140/64 (BP Location: Left Arm, Patient Position: Sitting, Cuff Size: Normal)   Pulse 75   Temp 98.2 F (36.8 C) (Oral)   Wt 140 lb 4.8 oz (63.6 kg)   SpO2 96%   BMI 24.27 kg/m   Weight: 140 lb 4.8 oz (63.6 kg)   BP Readings from Last 3 Encounters:  01/03/18 140/64  11/08/17 122/80  08/21/17 138/78   Wt Readings from Last 3 Encounters:  01/03/18 140 lb 4.8 oz (63.6 kg)  11/08/17 139 lb 4.8 oz (63.2 kg)  08/21/17 138 lb 3.2 oz (62.7 kg)    Physical Exam  Constitutional: She is oriented to person, place, and time. She appears well-developed and well-nourished. No distress.  HENT:  Mouth/Throat: Uvula is midline and oropharynx is clear and moist. No oropharyngeal exudate or posterior oropharyngeal erythema.  There is some erythema of lips; more notable on lower lip.   Cardiovascular: Normal rate, regular rhythm and normal heart sounds. Exam reveals no friction rub.  No murmur heard. No lower extremity edema  Pulmonary/Chest: Effort normal and breath sounds normal. No respiratory distress. She has no wheezes. She has no rales.  Neurological: She is alert and oriented to person, place, and time.  Psychiatric: Her behavior is normal. Cognition and memory are normal.    Assessment/Plan  1. Hypothyroidism, unspecified type - TSH;  Future  2. Iron deficiency anemia, unspecified iron deficiency anemia type Will check bloodwork  3. Hyponatremia Recheck levels  4. Arthralgia, unspecified joint  - ANA; Future  5. Dry mouth  - Sjogrens syndrome-A extractable nuclear antibody - Sjogrens syndrome-B extractable nuclear antibody - ANA; Future  6. Burning tongue  - Magnesium, RBC - Vitamin B6 - Zinc  7. Other fatigue  - TSH; Future - VITAMIN D 25 Hydroxy (Vit-D Deficiency, Fractures); Future - C-reactive protein; Future - CBC with Differential/Platelet; Future - ANA; Future  8. Anemia,  unspecified type  - Vitamin B12; Future - Iron and TIBC; Future - Folate; Future - Ferritin; Future - CBC with Differential/Platelet; Future    Return pending bloodwork results.     Micheline Rough, MD

## 2018-01-07 ENCOUNTER — Other Ambulatory Visit: Payer: Self-pay | Admitting: Family Medicine

## 2018-01-07 MED ORDER — LEVOTHYROXINE SODIUM 75 MCG PO TABS: 75.0000 ug | ORAL_TABLET | Freq: Every day | ORAL | 1 refills | Status: DC

## 2018-01-08 LAB — ZINC: Zinc: 51 ug/dL — ABNORMAL LOW (ref 60–130)

## 2018-01-08 LAB — SJOGRENS SYNDROME-B EXTRACTABLE NUCLEAR ANTIBODY: SSB (La) (ENA) Antibody, IgG: <1 AI

## 2018-01-08 LAB — IRON,TIBC AND FERRITIN PANEL
%SAT: 28 % (ref 16–45)
FERRITIN: 279 ng/mL (ref 16–288)
Iron: 73 ug/dL (ref 45–160)
TIBC: 262 ug/dL (ref 250–450)

## 2018-01-08 LAB — SJOGRENS SYNDROME-A EXTRACTABLE NUCLEAR ANTIBODY: SSA (RO) (ENA) ANTIBODY, IGG: NEGATIVE AI

## 2018-01-08 LAB — VITAMIN B6: Vitamin B6: 112.7 ng/mL — ABNORMAL HIGH (ref 2.1–21.7)

## 2018-01-08 LAB — ANA: Anti Nuclear Antibody(ANA): NEGATIVE

## 2018-01-08 LAB — TIQ-NTM

## 2018-01-08 LAB — MAGNESIUM, RBC: MAGNESIUM RBC: 4.9 mg/dL (ref 4.0–6.4)

## 2018-01-10 ENCOUNTER — Other Ambulatory Visit: Payer: Self-pay | Admitting: Family Medicine

## 2018-01-10 MED ORDER — CHLORHEXIDINE GLUCONATE 0.12 % MT SOLN: 15.0000 mL | Freq: Two times a day (BID) | OROMUCOSAL | 2 refills | Status: DC

## 2018-03-12 ENCOUNTER — Other Ambulatory Visit: Payer: Self-pay | Admitting: Family Medicine

## 2018-03-12 NOTE — Telephone Encounter (Signed)
Last fill 08/21/17 by Dr. Loanne Drilling Last OV 11/08/17  Ok to fill?

## 2018-05-05 ENCOUNTER — Other Ambulatory Visit: Payer: Self-pay | Admitting: Family Medicine

## 2018-05-07 NOTE — Telephone Encounter (Signed)
Last filled 01/10/18, is patient to continue this medication?

## 2018-05-07 NOTE — Telephone Encounter (Signed)
I clicked approve before seeing your message.   I was not original prescriber of the solution. Does she feel that it helps her mouth? How is mouth feeling? If she feels there is benefit, this is helpful to keep infection at [place]. It may have been dentist that originally gave it; in which case would be reasonable to follow back up with them if not getting relief.

## 2018-05-29 ENCOUNTER — Ambulatory Visit: Payer: Self-pay | Admitting: Family Medicine

## 2018-05-29 NOTE — Telephone Encounter (Signed)
Pt scheduled for telephone visit 05/30/18.

## 2018-05-29 NOTE — Telephone Encounter (Signed)
Pt called c/o her left mid calf to ankle being red, warm and having chills with a low grade fever.  It's hard to walk on it.   I've been putting ice packs on it but it has not helped.   "I think I may have cellulitis".  She said she has Purpura and manages it pretty well.  Due to the COVID-19 pandemic they are doing video visits.  Pt does not have a way to do a video visit.    I warm transferred her call to the office and connected her with a staff member for further disposition.  I sent my triage notes over to the office.  Reason for Disposition . [1] Swelling is painful to touch AND [2] fever  Answer Assessment - Initial Assessment Questions 1. ONSET: "When did the swelling start?" (e.g., minutes, hours, days)     Left leg is inflamed and sore to the touch.    I have porora.   I can't bear weight on it.    I'm using ice packs on it.   I've not left my house for 5 weeks.  I'm running a low grade fever.   I'm having chills it looks like cellulitis. 2. LOCATION: "What part of the leg is swollen?"  "Are both legs swollen or just one leg?"     Mid calf to the ankle on the left.   I wear pressure hose.    3. SEVERITY: "How bad is the swelling?" (e.g., localized; mild, moderate, severe)  - Localized - small area of swelling localized to one leg  - MILD pedal edema - swelling limited to foot and ankle, pitting edema < 1/4 inch (6 mm) deep, rest and elevation eliminate most or all swelling  - MODERATE edema - swelling of lower leg to knee, pitting edema > 1/4 inch (6 mm) deep, rest and elevation only partially reduce swelling  - SEVERE edema - swelling extends above knee, facial or hand swelling present      It's warm and red in my mid calf to my ankle. 4. REDNESS: "Does the swelling look red or infected?"     Yes 5. PAIN: "Is the swelling painful to touch?" If so, ask: "How painful is it?"   (Scale 1-10; mild, moderate or severe)     No open sores.  It's tender to the touch. 6. FEVER: "Do you  have a fever?" If so, ask: "What is it, how was it measured, and when did it start?"      Yes  Low grade. 7. CAUSE: "What do you think is causing the leg swelling?"     I have purpora. 8. MEDICAL HISTORY: "Do you have a history of heart failure, kidney disease, liver failure, or cancer?"     I COPD and asthma and hypothyroidism. 9. RECURRENT SYMPTOM: "Have you had leg swelling before?" If so, ask: "When was the last time?" "What happened that time?"     I have been managing it for 7 years but it's never been hot and remained hot and red. 10. OTHER SYMPTOMS: "Do you have any other symptoms?" (e.g., chest pain, difficulty breathing)       Chills more often with a low grade fever.    11. PREGNANCY: "Is there any chance you are pregnant?" "When was your last menstrual period?"       Not asked due to age  Protocols used: LEG SWELLING AND EDEMA-A-AH

## 2018-05-30 ENCOUNTER — Ambulatory Visit (INDEPENDENT_AMBULATORY_CARE_PROVIDER_SITE_OTHER): Payer: Medicare Other | Admitting: Family Medicine

## 2018-05-30 ENCOUNTER — Other Ambulatory Visit: Payer: Self-pay

## 2018-05-30 DIAGNOSIS — L0390 Cellulitis, unspecified: Secondary | ICD-10-CM | POA: Diagnosis not present

## 2018-05-30 MED ORDER — DOXYCYCLINE HYCLATE 100 MG PO TABS: 100.0000 mg | ORAL_TABLET | Freq: Two times a day (BID) | ORAL | 0 refills | Status: AC

## 2018-05-30 NOTE — Progress Notes (Signed)
Virtual Visit via Telephone Note  I connected with Paisyn Guercio on 05/30/18 at  8:00 AM EDT by telephone and verified that I am speaking with the correct person using two identifiers.   I discussed the limitations, risks, security and privacy concerns of performing an evaluation and management service by telephone and the availability of in person appointments. I also discussed with the patient that there may be a patient responsible charge related to this service. The patient expressed understanding and agreed to proceed.  Location patient: home Location provider: work or home office Participants present for the call: patient, provider Patient did not have a visit in the prior 7 days to address this/these issue(s).   History of Present Illness: Called in on 05/29/18 with complaints of left mid calf to ankle being red, warm, and having chills w low grade fever. Using ice packs. Hard to walk on leg. No swelling in other leg or significant swelling in this; more just vein discomfort (chronic) for which she wears compression stockings and keeps elevated.   "I've been doing my part". Has dealt with this chronically. Last 6-8 months leg has been more painful. Typically deals with purpura. There is area that seems to be center of extreme pain. Has been trying to deal with this on own. Uses 2 regular tylenol daily just when needed for pain. In last several days pain is getting worse. She has hard time bearing weight on this. Late Monday night started to feel hot all the time and more painful. Then started in evening with low grade fever and chills. Was hoping that she might be able to get antibiotic to help with this. Worried about this getting worse.   Hasn't been anywhere in 5 weeks, no sick exposures.   Area of hotness is 2 x 1.5 inch. Whole area is about size of hand. Pain goes down and around ankle. Foot is not swollen. Also dealing with her chronic skin purpura and varicosities. "I'm usually 96.8 temp  and now running 98.2 or 97.6". No drainage from leg, but center area feels "thinner and more sensitive".   Pain is different than when she has had clot in past in leg - appeared and felt different.   Observations/Objective: Patient sounds cheerful and well on the phone. I do not appreciate any SOB. Speech and thought processing are grossly intact. Patient reported vitals: temp 98.2  Assessment and Plan:  1. Cellulitis, unspecified cellulitis site Take doxycycline as directed. Continue to elevate legs and keep compression stockings. We discussed limiting doxy to shortest course possible. Suggested 5 days may be enough. I have sent message to MA to check in Friday and get update on sx.  Follow Up Instructions:  Return if symptoms worsen or fail to improve.     [id number] 11-20  I did not refer this patient for an OV in the next 24 hours for this/these issue(s).  I discussed the assessment and treatment plan with the patient. The patient was provided an opportunity to ask questions and all were answered. The patient agreed with the plan and demonstrated an understanding of the instructions.   The patient was advised to call back or seek an in-person evaluation if the symptoms worsen or if the condition fails to improve as anticipated.  I provided 16 minutes of non-face-to-face time during this encounter. We discussed tx plan and sx to monitor.   Micheline Rough, MD

## 2018-07-11 ENCOUNTER — Other Ambulatory Visit: Payer: Self-pay | Admitting: Family Medicine

## 2018-07-11 MED ORDER — LEVOTHYROXINE SODIUM 75 MCG PO TABS: 75.0000 ug | ORAL_TABLET | Freq: Every day | ORAL | 0 refills | Status: DC

## 2018-07-11 NOTE — Telephone Encounter (Signed)
Rx done. 

## 2018-07-11 NOTE — Telephone Encounter (Signed)
Copied from [address] [id number]. Topic: Quick Communication - Rx Refill/Question >> Jul 11, 2018 11:37 AM Waylan Rocher, Lumin L wrote: Medication: levothyroxine (SYNTHROID, LEVOTHROID) 75 MCG tablet (0 pills left)  Has the patient contacted their pharmacy? yes (Agent: If no, request that the patient contact the pharmacy for the refill.) (Agent: If yes, when and what did the pharmacy advise?) contacted pharmacy 3 times last week  Preferred Pharmacy (with phone number or street name): [id number] [id number]: [phone]: [phone] Agent: Please be advised that RX refills may take up to 3 business days. We ask that you follow-up with your pharmacy.

## 2018-08-03 ENCOUNTER — Other Ambulatory Visit: Payer: Self-pay | Admitting: Family Medicine

## 2018-08-06 ENCOUNTER — Other Ambulatory Visit: Payer: Self-pay | Admitting: Family Medicine

## 2018-08-07 ENCOUNTER — Other Ambulatory Visit: Payer: Self-pay | Admitting: *Deleted

## 2018-08-07 ENCOUNTER — Ambulatory Visit (INDEPENDENT_AMBULATORY_CARE_PROVIDER_SITE_OTHER): Payer: Medicare Other | Admitting: Family Medicine

## 2018-08-07 ENCOUNTER — Other Ambulatory Visit: Payer: Self-pay

## 2018-08-07 DIAGNOSIS — E60 Dietary zinc deficiency: Secondary | ICD-10-CM | POA: Diagnosis not present

## 2018-08-07 DIAGNOSIS — K1379 Other lesions of oral mucosa: Secondary | ICD-10-CM | POA: Diagnosis not present

## 2018-08-07 DIAGNOSIS — E039 Hypothyroidism, unspecified: Secondary | ICD-10-CM | POA: Diagnosis not present

## 2018-08-07 DIAGNOSIS — R7989 Other specified abnormal findings of blood chemistry: Secondary | ICD-10-CM | POA: Diagnosis not present

## 2018-08-07 MED ORDER — LEVOTHYROXINE SODIUM 75 MCG PO TABS: 75.0000 ug | ORAL_TABLET | Freq: Every day | ORAL | 0 refills | Status: DC

## 2018-08-07 MED ORDER — CHLORHEXIDINE GLUCONATE 0.12 % MT SOLN: OROMUCOSAL | 0 refills | Status: DC

## 2018-08-07 NOTE — Progress Notes (Signed)
Virtual Visit via Telephone Note  I connected with [name] on 08/07/18 at  3:20 PM EDT by telephone and verified that I am speaking with the correct person using two identifiers.   I discussed the limitations, risks, security and privacy concerns of performing an evaluation and management service by telephone and the availability of in person appointments. I also discussed with the patient that there may be a patient responsible charge related to this service. The patient expressed understanding and agreed to proceed.  Location patient: home Location provider: work or home office Participants present for the call: patient, provider Patient did not have a visit in the prior 7 days to address this/these issue(s).   History of Present Illness:  Acute visit for two refills per patient. Reports wants a refill on chlorhexidine sol - uses for mouth sores that started months ago with long term steroid use. The reports PCP refills this for her, but PCP is out of the office this week and she requests a refill. She also has had some sinus issues that started 3-4 days ago. She had some yellow nasal congestion, sinus pain, feels more tired then usual. Seems to be improving with nasal saline. BP 130/64. Temp normal. Denies SOB, coug, fever, NVD, urinary symptoms. She has not been around anyone other then her son who drops her groceries at her patio. She wears a mask and uses social distancing even with him. She wants some labs to check her thyroid. She also wants to check her blood counts.   Observations/Objective: Patient sounds cheerful and well on the phone. I do not appreciate any SOB. Speech and thought processing are grossly intact. Patient reported vitals:  Assessment and Plan:  Sore mouth Sinus congestion Hypothyroidism Low vit D Low zinc  Refilled chorhexadine x 1, but will defer to PCP for future refills.  Sinus issues improving, advised she let us know if recur or worsen or she has  concerns.Discussed COVID19, she does not feels she is at risk given the extreme precaution she has taken. Labs per orders to follow up from last check > 6 months ago with follow up with PCP in office in the next 1 month for regular follow up and to see that she is improving.  Follow Up Instructions:   I did not refer this patient for an OV in the next 24 hours for this/these issue(s).  I discussed the assessment and treatment plan with the patient. The patient was provided an opportunity to ask questions and all were answered. The patient agreed with the plan and demonstrated an understanding of the instructions.   The patient was advised to call back or seek an in-person evaluation if the symptoms worsen or if the condition fails to improve as anticipated.  I provided 18 minutes of non-face-to-face time during this encounter.   Follow up instructions: Advised assistant Wendie Simmer to help patient arrange the following: -lab visit in 1-2 weeks if feeling better, o/w virtual follow up -follow up with Dr. Ethlyn Gallery 1 week after labs  Lucretia Kern, DO

## 2018-08-07 NOTE — Telephone Encounter (Signed)
Pt called in and stated that she does not want to run out over the holiday weekend .  She would like this filled for 90 days

## 2018-08-07 NOTE — Telephone Encounter (Signed)
The patient has a virtual appointment scheduled this afternoon with Dr. Maudie Mercury. She would also like a refill on these 2 Rx:  levothyroxine (SYNTHROID) 75 MCG tablet   chlorhexidine (PERIDEX) 0.12 % solution    [facility]:  [id number](Phone) [phone](Fax)

## 2018-08-07 NOTE — Telephone Encounter (Signed)
Patient would also like 90 days supply sent in

## 2018-08-07 NOTE — Telephone Encounter (Signed)
Rx sent for Levothyroxine and generic Peridex was denied as the pt should contact her dentist-see prior note from Dr Ethlyn Gallery.

## 2018-08-09 ENCOUNTER — Telehealth: Payer: Self-pay | Admitting: *Deleted

## 2018-08-09 NOTE — Telephone Encounter (Signed)
Patient requests a refill on Chlorhexidine 0.12 solution as she stated her mouth is so sore she cannot sleep at night.  Previous Rx was given by Dr Ethlyn Gallery and a note was included stating the pt needs to contact her dentist and the pt was informed of this (also sent to the pharmacy with denial).  Patient stated her dentist told her he cannot help her as she has nothing left in her mouth?  Message sent to [facility] for recommendations.

## 2018-08-09 NOTE — Telephone Encounter (Signed)
-----   Message from Lucretia Kern, DO sent at 08/07/2018  4:03 PM EDT ----- -lab visit in 1-2 weeks if feeling better, o/w virtual follow up-follow up with Dr. Ethlyn Gallery 1 week after labs

## 2018-08-09 NOTE — Telephone Encounter (Signed)
I called the pt and scheduled a lab appt. Message sent to Dr Ethlyn Gallery as there are no openings available.

## 2018-08-09 NOTE — Telephone Encounter (Signed)
I called the pt and informed her the Rx was sent in by Dr Maudie Mercury on 6/30-I was not aware of this prior to sending a message to [facility].

## 2018-08-13 NOTE — Telephone Encounter (Signed)
Ok to schedule visit within 2 weeks of lab visit.

## 2018-08-14 NOTE — Telephone Encounter (Signed)
I called the pt and scheduled a follow up visit on 7/24 as there were no openings on 7/22.

## 2018-08-15 ENCOUNTER — Other Ambulatory Visit (INDEPENDENT_AMBULATORY_CARE_PROVIDER_SITE_OTHER): Payer: Medicare Other

## 2018-08-15 ENCOUNTER — Other Ambulatory Visit: Payer: Self-pay

## 2018-08-15 DIAGNOSIS — E60 Dietary zinc deficiency: Secondary | ICD-10-CM | POA: Diagnosis not present

## 2018-08-15 DIAGNOSIS — K1379 Other lesions of oral mucosa: Secondary | ICD-10-CM | POA: Diagnosis not present

## 2018-08-15 DIAGNOSIS — R7989 Other specified abnormal findings of blood chemistry: Secondary | ICD-10-CM | POA: Diagnosis not present

## 2018-08-15 DIAGNOSIS — E039 Hypothyroidism, unspecified: Secondary | ICD-10-CM | POA: Diagnosis not present

## 2018-08-15 LAB — CBC
HCT: 36.1 % (ref 36.0–46.0)
Hemoglobin: 12 g/dL (ref 12.0–15.0)
MCHC: 33.3 g/dL (ref 30.0–36.0)
MCV: 95.5 fl (ref 78.0–100.0)
Platelets: 324 10*3/uL (ref 150.0–400.0)
RBC: 3.78 Mil/uL — ABNORMAL LOW (ref 3.87–5.11)
RDW: 12.8 % (ref 11.5–15.5)
WBC: 7.7 10*3/uL (ref 4.0–10.5)

## 2018-08-15 LAB — TSH: TSH: 0.07 u[IU]/mL — ABNORMAL LOW (ref 0.35–4.50)

## 2018-08-15 LAB — VITAMIN D 25 HYDROXY (VIT D DEFICIENCY, FRACTURES): VITD: 18.23 ng/mL — ABNORMAL LOW (ref 30.00–100.00)

## 2018-08-21 LAB — ZINC: Zinc: 68 ug/dL (ref 60–130)

## 2018-08-31 ENCOUNTER — Ambulatory Visit (INDEPENDENT_AMBULATORY_CARE_PROVIDER_SITE_OTHER): Payer: Medicare Other | Admitting: Family Medicine

## 2018-08-31 ENCOUNTER — Other Ambulatory Visit: Payer: Self-pay

## 2018-08-31 DIAGNOSIS — M810 Age-related osteoporosis without current pathological fracture: Secondary | ICD-10-CM | POA: Diagnosis not present

## 2018-08-31 DIAGNOSIS — K1379 Other lesions of oral mucosa: Secondary | ICD-10-CM | POA: Diagnosis not present

## 2018-08-31 DIAGNOSIS — E039 Hypothyroidism, unspecified: Secondary | ICD-10-CM

## 2018-08-31 DIAGNOSIS — J4540 Moderate persistent asthma, uncomplicated: Secondary | ICD-10-CM

## 2018-08-31 DIAGNOSIS — R21 Rash and other nonspecific skin eruption: Secondary | ICD-10-CM

## 2018-08-31 MED ORDER — LEVOTHYROXINE SODIUM 50 MCG PO TABS: 50.0000 ug | ORAL_TABLET | Freq: Every day | ORAL | 1 refills | Status: DC

## 2018-08-31 MED ORDER — FLOVENT HFA 110 MCG/ACT IN AERO: INHALATION_SPRAY | RESPIRATORY_TRACT | 3 refills | Status: DC

## 2018-08-31 MED ORDER — CHLORHEXIDINE GLUCONATE 0.12 % MT SOLN: OROMUCOSAL | 3 refills | Status: DC

## 2018-08-31 MED ORDER — LEVOTHYROXINE SODIUM 75 MCG PO TABS: ORAL_TABLET | ORAL | 1 refills | Status: DC

## 2018-08-31 NOTE — Progress Notes (Signed)
Virtual Visit via Telephone Note  I connected with [name]  on 08/31/18 at  3:45 PM EDT by telephone and verified that I am speaking with the correct person using two identifiers.   I discussed the limitations, risks, security and privacy concerns of performing an evaluation and management service by telephone and the availability of in person appointments. I also discussed with the patient that there may be a patient responsible charge related to this service. The patient expressed understanding and agreed to proceed.  Location patient: home Location provider: work office Participants present for the call: patient, provider Patient did not have a visit in the prior 7 days to address this/these issue(s).   History of Present Illness: "not so good" - just the same things. Long, hot summer.  Having pain with her chronic purpura. States that it doesn't look different, but just hurts. Has had pain with this in past, but does get severe for her. Hasn't tried anything for this in past; just worries about pain medications, but getting to point where she feels she needs something to get some relief. Takes 2 tylenol daily. Has developed over a period of years. Has spine and neck problems, hip (right) and shoulder (right). DDD and osteoarthritis, bones thinning. Pain does wake her up at night. Usually has to get up about 3 x/night to urinate.  Hemp cream caused asthma attack. Tries heat, ice without relief. Keeps legs elevated, but still not helping.     Observations/Objective: Patient sounds cheerful and well on the phone. I do not appreciate any SOB. Speech and thought processing are grossly intact. Patient reported vitals:  Assessment and Plan: 1. Hypothyroidism, unspecified type Will recheck tsh in 6 weeks time. Currently taking 23mg x 5 days, then 79mx 2 days during week with synthroid.  2. Moderate persistent asthma, unspecified whether complicated Stable. Continue current  medications.  3. Age-related osteoporosis without current pathological fracture Low vitamin d.  Has difficulty with swallowing pills.  I reread instructions from lab results for Dr. KiMaudie Mercuryad suggested vitamin D in liquid form.  She feels that this will be a good option for her and would like to try this.  4. Sore mouth Chlorhexidine does keep mouth sores stable.  She has used this for years.  We refilled this today for her.  5. Rash and nonspecific skin eruption Feels that some of her tenderness is secondary to the purpura in addition to osteoporosis and may be arthritis.  She is very sensitive to medications, even topicals.  We discussed trying Voltaren gel for pain to see if this is helpful.  I am concerned if we use anything stronger or something that is compounded with multiple elements that she will be more likely to have a bad reaction.  I have encouraged her to let me know how she does with the Voltaren gel and we can try something different if this is not effective.   Follow Up Instructions:  Return labwork in 6 weeks.     I did not refer this patient for an OV in the next 24 hours for this/these issue(s).  I discussed the assessment and treatment plan with the patient. The patient was provided an opportunity to ask questions and all were answered. The patient agreed with the plan and demonstrated an understanding of the instructions.   The patient was advised to call back or seek an in-person evaluation if the symptoms worsen or if the condition fails to improve as anticipated.  I provided 25 minutes of non-face-to-face time during this encounter.   Micheline Rough, MD

## 2018-09-02 ENCOUNTER — Other Ambulatory Visit: Payer: Self-pay | Admitting: Family Medicine

## 2018-09-03 ENCOUNTER — Telehealth: Payer: Self-pay | Admitting: Family Medicine

## 2018-09-03 ENCOUNTER — Telehealth: Payer: Self-pay | Admitting: *Deleted

## 2018-09-03 NOTE — Telephone Encounter (Signed)
-----   Message from Caren Macadam, MD sent at 09/01/2018 10:01 AM EDT ----- Please schedule labwork in 6 weeks time

## 2018-09-03 NOTE — Telephone Encounter (Signed)
Because diclofenac (voltaren) is in the anti-inflammatory category it will come with warning of GI side effects. In topical form however, theses are very low and in studies similar to placebo. I would feel comfortable with her trying this for pain.  In terms of asthma, although that category of medication is not shown to induce asthma, because it is an NSAID and there are people with aspirin associated asthma flares it keeps this warning.   That being said, I know she does have issues with many medications and she has had side effects that didn't always seem to go with the "norm" (like asthma after hemp cream). So I understand her concern.   I would suggest discussing with pharmacist in more detail if concerns. There are risks to all medications, even topical, but I do feel this is still a safe choice.   If she wants to try something else - we could talk about compounding cream but I worry about combo of meds as well. Let me know.

## 2018-09-03 NOTE — Telephone Encounter (Signed)
I left a detailed message at the pts home number to call for a lab appt as below.

## 2018-09-03 NOTE — Telephone Encounter (Signed)
Some of the side effects of the Volteren gel are asthma and stomach bleed.  Patient has both of these.  Patient is requesting a different medication to take.  She is requesting to talk to someone about this.

## 2018-09-04 NOTE — Telephone Encounter (Signed)
Patient returned [name]'s call can be reached at Ph# [phone]

## 2018-09-04 NOTE — Telephone Encounter (Signed)
I called the pt and informed her of the message below.  Patient advised to contact pharmacist with questions and to call back if she wanted to try something else.

## 2018-09-04 NOTE — Telephone Encounter (Signed)
I left a message for the pt to return my call.  CRM also created. 

## 2018-09-17 ENCOUNTER — Other Ambulatory Visit (INDEPENDENT_AMBULATORY_CARE_PROVIDER_SITE_OTHER): Payer: Medicare Other

## 2018-09-17 ENCOUNTER — Telehealth (INDEPENDENT_AMBULATORY_CARE_PROVIDER_SITE_OTHER): Payer: Medicare Other | Admitting: Family Medicine

## 2018-09-17 ENCOUNTER — Telehealth: Payer: Self-pay | Admitting: *Deleted

## 2018-09-17 ENCOUNTER — Other Ambulatory Visit: Payer: Self-pay

## 2018-09-17 DIAGNOSIS — R300 Dysuria: Secondary | ICD-10-CM

## 2018-09-17 DIAGNOSIS — R3989 Other symptoms and signs involving the genitourinary system: Secondary | ICD-10-CM | POA: Diagnosis not present

## 2018-09-17 DIAGNOSIS — E039 Hypothyroidism, unspecified: Secondary | ICD-10-CM | POA: Diagnosis not present

## 2018-09-17 LAB — POC URINALSYSI DIPSTICK (AUTOMATED)
Bilirubin, UA: NEGATIVE
Blood, UA: NEGATIVE
Glucose, UA: NEGATIVE
Nitrite, UA: NEGATIVE
Protein, UA: NEGATIVE
Spec Grav, UA: 1.01 (ref 1.010–1.025)
Urobilinogen, UA: 0.2 E.U./dL
pH, UA: 8 (ref 5.0–8.0)

## 2018-09-17 LAB — TSH: TSH: 1.18 u[IU]/mL (ref 0.35–4.50)

## 2018-09-17 MED ORDER — CEPHALEXIN 500 MG PO CAPS: 500.0000 mg | ORAL_CAPSULE | Freq: Three times a day (TID) | ORAL | 0 refills | Status: DC

## 2018-09-17 NOTE — Progress Notes (Signed)
This visit type was conducted due to national recommendations for restrictions regarding the COVID-19 pandemic in an effort to limit this patient's exposure and mitigate transmission in our community.   Virtual Visit via Telephone Note  I connected with [name] on 09/17/18 at  4:00 PM EDT by telephone and verified that I am speaking with the correct person using two identifiers.   I discussed the limitations, risks, security and privacy concerns of performing an evaluation and management service by telephone and the availability of in person appointments. I also discussed with the patient that there may be a patient responsible charge related to this service. The patient expressed understanding and agreed to proceed.  Location patient: home Location provider: work or home office Participants present for the call: patient, provider Patient did not have a visit in the prior 7 days to address this/these issue(s).   History of Present Illness: Patient had come in this morning with complaints of some burning around her urethral area.  She states that she is not actually having burning while she is urinating but more persistently and continuously.  She has had apparently some chronic burning mouth symptoms and states the symptoms are very similar.  She has not noted any gross blood.  No vaginal discharge.  No fevers or chills.  No flank pain.  She has had about 4 days now of some burning sensation around the urethra area.  She has not noted any redness.  She apparently seen urologist in the past.  Denies any recent UTI.  No recent instrumentation of bladder.   Observations/Objective: Patient sounds cheerful and well on the phone. I do not appreciate any SOB. Speech and thought processing are grossly intact. Patient reported vitals:  Assessment and Plan:  Dysuria.  Rule out urinary infection.  Patient has moderate leukocytes on urine dipstick  -Urine culture sent -Cover with Keflex 500 mg 3  times daily for 5 days pending culture results -Follow-up immediately for any fever or worsening symptoms -Discussed that she may want to explore other possible etiologies for her discomfort such as atrophic vaginitis if culture negative and symptoms persist  Follow Up Instructions:  -As above   [id number] 5-10 [id number] 11-20 [id number] 21-30 I did not refer this patient for an OV in the next 24 hours for this/these issue(s).  I discussed the assessment and treatment plan with the patient. The patient was provided an opportunity to ask questions and all were answered. The patient agreed with the plan and demonstrated an understanding of the instructions.   The patient was advised to call back or seek an in-person evaluation if the symptoms worsen or if the condition fails to improve as anticipated.  I provided 18 minutes of non-face-to-face time during this encounter.   Carolann Littler, MD

## 2018-09-17 NOTE — Telephone Encounter (Signed)
RN scheduled patient to for virtual visit with Dr. Elease Hashimoto at [facility]

## 2018-09-17 NOTE — Telephone Encounter (Signed)
CRM also created.

## 2018-09-17 NOTE — Telephone Encounter (Signed)
Per Jacquelynn Cree the pt came in for a lab appt and stated she has noticed some burning with urination.  He ran a POC urinalysis test and showed me the results.  I called the pt and left a detailed message at her home number stating she would need an appt with a provider here to review the results and determine treatment.  I asked that she call back to schedule an appt.

## 2018-09-18 NOTE — Telephone Encounter (Signed)
Noted  

## 2018-09-19 LAB — URINE CULTURE
MICRO NUMBER:: 755205
SPECIMEN QUALITY:: ADEQUATE

## 2018-10-12 ENCOUNTER — Ambulatory Visit: Payer: Medicare Other | Admitting: Family Medicine

## 2018-11-12 ENCOUNTER — Telehealth: Payer: Self-pay | Admitting: *Deleted

## 2018-11-12 NOTE — Telephone Encounter (Signed)
I called the pt and informed her of the message below.  Appt scheduled for 10/6 at 3pm.

## 2018-11-12 NOTE — Telephone Encounter (Signed)
See if she would be willing to do virtual with [facility] for this? My last note explains her chronic conditions and I feel like [facility] would be able to advise.

## 2018-11-12 NOTE — Telephone Encounter (Signed)
Copied from [place] [id number]. Topic: General - Other >> Nov 12, 2018  1:18 PM Ivar Drape wrote: Reason for CRM: Patient would like a Virtual appointment to discuss Pain Meds for her disease that's causing burning in her mouth. The first available appointment is two weeks away, but she says she can't wait that long for an appointment.  The pain is to great.  Please advise.

## 2018-11-13 ENCOUNTER — Encounter: Payer: Self-pay | Admitting: Family Medicine

## 2018-11-13 ENCOUNTER — Telehealth (INDEPENDENT_AMBULATORY_CARE_PROVIDER_SITE_OTHER): Payer: Medicare Other | Admitting: Family Medicine

## 2018-11-13 ENCOUNTER — Other Ambulatory Visit: Payer: Self-pay

## 2018-11-13 DIAGNOSIS — G8929 Other chronic pain: Secondary | ICD-10-CM | POA: Diagnosis not present

## 2018-11-13 NOTE — Progress Notes (Signed)
Virtual Visit via Telephone Note  I connected with [name] on 11/13/18 at  3:00 PM EDT by telephone and verified that I am speaking with the correct person using two identifiers.   I discussed the limitations, risks, security and privacy concerns of performing an evaluation and management service by telephone and the availability of in person appointments. I also discussed with the patient that there may be a patient responsible charge related to this service. The patient expressed understanding and agreed to proceed.  Location patient: home Location provider: work or home office Participants present for the call: patient, provider Patient did not have a visit in the prior 7 days to address this/these issue(s).   History of Present Illness:   Reports she is having a lot of pain. She has chronic pain.  Reports struggles with chronic pain in her legs, DDD, hip arthritis and has a torn rotator cuff. She feels like she needs something for pain, but feels like she has been sitting around for 1 year because of her pain. She is afraid to take medications. Take two regular strength tylenols 1-2 times per day. She doesn't know how much she can take. Reports it does help. She also used menthol topical before and it helped, but she was worried it might interfere with her thyroid medication. She reports she had a "toxic" reaction remotely to aleve. She reports she also gets a lot of dental pain. No sig worsening or change today, reports she just is tired of it.  Observations/Objective: Patient sounds cheerful and well on the phone. I do not appreciate any SOB. Speech and thought processing are grossly intact. Patient reported vitals:  Assessment and Plan:  Chronic pain - multifactorial it seems.  Discussed limitations of virtual visit and advised will need in person visit if not improving. She prefers virtual visit today. Various treatment options for pain and risks discussed. She is going to try  to increase the tylenol to 1060m bid and use the topical menthol more often as it gives her a 1 hour window of relief. Advised PCP follow up for underlying conditions and dental evaluation for the dental issues. She agrees to try to call dentist and follow up with PCP if these measures are not working. Advised may need pain clinic referral if not improving and to call if that is the case.  Follow Up Instructions: Follow up as needed if any worsening or not improving.  I did not refer this patient for an OV in the next 24 hours for this/these issue(s).  I discussed the assessment and treatment plan with the patient. The patient was provided an opportunity to ask questions and all were answered. The patient agreed with the plan and demonstrated an understanding of the instructions.   The patient was advised to call back or seek an in-person evaluation if the symptoms worsen or if the condition fails to improve as anticipated.  I provided 15 minutes of non-face-to-face time during this encounter.   HLucretia Kern DO

## 2018-11-15 ENCOUNTER — Encounter (HOSPITAL_COMMUNITY): Payer: Self-pay

## 2018-11-15 ENCOUNTER — Other Ambulatory Visit: Payer: Self-pay

## 2018-11-15 ENCOUNTER — Ambulatory Visit (HOSPITAL_COMMUNITY)
Admission: EM | Admit: 2018-11-15 | Discharge: 2018-11-15 | Disposition: A | Discharge status: Home / Self Care | Payer: Medicare Other | Attending: Nurse Practitioner | Admitting: Nurse Practitioner

## 2018-11-15 DIAGNOSIS — G894 Chronic pain syndrome: Secondary | ICD-10-CM

## 2018-11-15 DIAGNOSIS — K029 Dental caries, unspecified: Secondary | ICD-10-CM | POA: Diagnosis not present

## 2018-11-15 DIAGNOSIS — M2550 Pain in unspecified joint: Secondary | ICD-10-CM

## 2018-11-15 MED ORDER — CLINDAMYCIN HCL 150 MG PO CAPS: 150.0000 mg | ORAL_CAPSULE | Freq: Four times a day (QID) | ORAL | 0 refills | Status: DC

## 2018-11-15 MED ORDER — DICLOFENAC SODIUM 1 % TD GEL: 2.0000 g | Freq: Four times a day (QID) | TRANSDERMAL | 0 refills | Status: DC

## 2018-11-15 MED ORDER — TRAMADOL HCL 50 MG PO TABS: 50.0000 mg | ORAL_TABLET | Freq: Two times a day (BID) | ORAL | 0 refills | Status: DC | PRN

## 2018-11-15 NOTE — ED Triage Notes (Signed)
Pt states she has jaw and dental pain. Pt states she thinks she has a UTI.

## 2018-11-15 NOTE — ED Provider Notes (Addendum)
[place]    CSN: [id number] Arrival date & time: 11/15/18  4010      History   Chief Complaint Chief Complaint  Patient presents with   Dental Pain    HPI [name] is a 84 y.o. female.   Subjective:  [name] is a 84 y.o. female with somatization disorder and chronic pain who presents with multiple complaints of pain. She reports having pain issues "for years" and that "nobody will help her." Pain is located diffusely throughout the shoulders, legs, knees, joints, back and mouth. Patient reports that she has had dental issues for years requiring multiple teeth extractions in the past. She has some teeth remaining which are full of cavities and causing her pain in her mouth. Symptoms are exacerbated by use. She has been in pain management in the past but not currently. Patient had a virtual visit with PCP on 10/6 for similar complaints. Various treatment options for pain and risks were discussed at that time with the patient. She was advised to continue supportive OTC treatments and to follow-up with dentistry. Notably, it was advised that the patient may need pain clinic referral if not improving. Patient currently denies any fevers, chills, malaise, nausea, vomiting, diarrhea, chest pain, shortness of breath, palpitations, headache or dizziness. She lives alone in a senior citizens community and is independent of her ADLs.   The following portions of the patient's history were reviewed and updated as appropriate: allergies, current medications, past family history, past medical history, past social history, past surgical history and problem list.        Past Medical History:  Diagnosis Date   ABDOMINAL PAIN, CHRONIC 05/28/2008   ALLERGIC RHINITIS 09/21/2006   ANEMIA-NOS 09/21/2006   ASTHMA 09/21/2006   ASYMPTOMATIC POSTMENOPAUSAL STATUS 05/28/2008   Cataract    CHEST PAIN, ATYPICAL 05/28/2008   COPD (chronic obstructive pulmonary disease) ([place])     DDD (degenerative disc disease)    DIVERTICULITIS, HX OF 09/21/2006   GERD 09/21/2006   HYPERCHOLESTEROLEMIA 06/05/2009   HYPERTENSION 09/21/2006   HYPOTHYROIDISM 09/21/2006   Peptic ulcer disease    Purpura ([place])    RENAL INSUFFICIENCY 09/21/2006   Shingles    Small bowel obstruction ([place])    Somatization disorder 04/25/2007   UNSPECIFIED URINARY CALCULUS 05/28/2008    Patient Active Problem List   Diagnosis Date Noted   Arthralgia 08/21/2017   Osteoporosis 11/21/2016   Postmenopausal status 09/06/2016   Other nonspecific abnormal finding of lung field 08/14/2015   Hyponatremia 07/21/2014   Menopausal state 07/18/2014   Hematest positive stools 08/05/2013   Anemia, blood loss 08/05/2013   Cerumen impaction 06/03/2013   History of adrenal disorder 10/15/2012   Rash and nonspecific skin eruption 10/15/2012   Iron deficiency anemia 06/20/2012   Psychogenic polydipsia 06/20/2012   Nonspecific abnormal electrocardiogram (ECG) (EKG) 06/20/2012   Polyuria 05/02/2012   Pelvic pain in female 08/16/2011   Leg ulcer ([place]) 06/16/2010   Routine general medical examination at a health care facility 06/11/2010   Encounter for long-term (current) use of other medications 06/11/2010   HYPERCHOLESTEROLEMIA 06/05/2009   UNSPECIFIED URINARY CALCULUS 05/28/2008   CHEST PAIN, ATYPICAL 05/28/2008   ABDOMINAL PAIN, CHRONIC 05/28/2008   ASYMPTOMATIC POSTMENOPAUSAL STATUS 05/28/2008   SOMATIZATION DISORDER 04/25/2007   Hypothyroidism 09/21/2006   Essential hypertension 09/21/2006   ALLERGIC RHINITIS 09/21/2006   ASTHMA 09/21/2006   GERD 09/21/2006   Disorder resulting from impaired renal function 09/21/2006   DIVERTICULITIS, HX OF 09/21/2006  Past Surgical History:  Procedure Laterality Date   ABDOMINAL HYSTERECTOMY  1981   acute Nephritis  1958   APPENDECTOMY  1959   Asthma & Pneumonia Birth  1970's   [place]   Pulmonary thrombosis     TONSILLECTOMY     TUBAL LIGATION      OB History   No obstetric history on file.      Home Medications    Prior to Admission medications   Medication Sig Start Date End Date Taking? Authorizing Provider  albuterol (PROAIR HFA) 108 (90 Base) MCG/ACT inhaler Inhale 2 puffs into the lungs every 4 (four) hours as needed for wheezing or shortness of breath. 11/08/17   Koberlein, Steele Berg, MD  chlorhexidine (PERIDEX) 0.12 % solution USE AS DIRECTED. SWISH AND SPIT 15 MLS IN THE MOUTH OR THROAT 2 (TWO) TIMES DAILY. 08/31/18   Caren Macadam, MD  clindamycin (CLEOCIN) 150 MG capsule Take 1 capsule (150 mg total) by mouth every 6 (six) hours. 11/15/18   Enrique Sack, FNP  diclofenac sodium (VOLTAREN) 1 % GEL Apply 2 g topically 4 (four) times daily. Apply to all affected areas 11/15/18   Enrique Sack, FNP  Ferrous Sulfate (IRON) 325 (65 FE) MG TABS Take 1 tablet by mouth daily.     [provider]  fluticasone (FLOVENT HFA) 110 MCG/ACT inhaler TAKE 1 PUFF BY MOUTH TWICE A DAY 08/31/18   Koberlein, Steele Berg, MD  levothyroxine (SYNTHROID) 50 MCG tablet Take 1 tablet (50 mcg total) by mouth daily. 08/31/18   Caren Macadam, MD  levothyroxine (SYNTHROID) 75 MCG tablet Take by mouth 2 days per week 08/31/18   Caren Macadam, MD  Spacer/Aero-Holding Chambers (E-Z SPACER) inhaler Use as instructed 11/08/17   Caren Macadam, MD  traMADol (ULTRAM) 50 MG tablet Take 1 tablet (50 mg total) by mouth every 12 (twelve) hours as needed. 11/15/18   Enrique Sack, FNP    Family History Family History  Problem Relation Age of Onset   Cancer Mother        pancreatic   Polycystic kidney disease Mother    Cancer Father        bladder   Other Father        Schamberg disease   Marfan syndrome Son    Hemochromatosis Son    Cirrhosis Son    Allergic rhinitis  Sister    Other Brother        bone issue as child; multiple fractures but seemed to age out of this   Hemochromatosis Cousin    Arthritis Sister     Social History Social History   Tobacco Use   Smoking status: Never Smoker   Smokeless tobacco: Never Used  Substance Use Topics   Alcohol use: No   Drug use: No     Allergies   Fluconazole, Metronidazole, Macrobid [nitrofurantoin], Pepcid [famotidine], and Protonix [pantoprazole sodium]   Review of Systems Review of Systems  Constitutional: Negative for appetite change, chills, diaphoresis, fatigue and fever.  HENT: Positive for dental problem.   Respiratory: Negative.   Cardiovascular: Negative.   Gastrointestinal: Negative.   Genitourinary: Negative.   Musculoskeletal: Positive for arthralgias and back pain.  Neurological: Negative.   All other systems reviewed and are negative.    Physical Exam Triage Vital Signs ED Triage Vitals [  11/15/18 1011]  Enc Vitals Group     BP 137/64     Pulse Rate 78     Resp 16     Temp 97.9 F (36.6 C)     Temp Source Tympanic     SpO2 97 %     Weight 137 lb (62.1 kg)     Height      Head Circumference      Peak Flow      Pain Score 9     Pain Loc      Pain Edu?      Excl. in [place]?    No data found.  Updated Vital Signs BP 137/64 (BP Location: Right Arm)    Pulse 78    Temp 97.9 F (36.6 C) (Tympanic)    Resp 16    Wt 137 lb (62.1 kg)    SpO2 97%    BMI 23.70 kg/m   Visual Acuity Right Eye Distance:   Left Eye Distance:   Bilateral Distance:    Right Eye Near:   Left Eye Near:    Bilateral Near:     Physical Exam Constitutional:      General: She is not in acute distress.    Appearance: Normal appearance. She is not ill-appearing, toxic-appearing or diaphoretic.  HENT:     Head: Normocephalic.     Mouth/Throat:     Lips: Pink. No lesions.     Mouth: Mucous membranes are moist. No oral lesions.     Dentition: Dental caries present. No gingival  swelling, dental abscesses or gum lesions.     Tongue: No lesions.     Pharynx: Oropharynx is clear. Uvula midline. No posterior oropharyngeal erythema or uvula swelling.     Tonsils: No tonsillar exudate or tonsillar abscesses.  Neck:     Musculoskeletal: Normal range of motion and neck supple.  Cardiovascular:     Rate and Rhythm: Normal rate and regular rhythm.  Pulmonary:     Effort: Pulmonary effort is normal.     Breath sounds: Normal breath sounds.  Musculoskeletal: Normal range of motion.        General: No swelling, deformity or signs of injury.     Right shoulder: She exhibits tenderness.     Left shoulder: She exhibits tenderness.     Right knee: Tenderness found.     Left knee: Tenderness found.     Thoracic back: She exhibits pain.     Lumbar back: She exhibits pain.     Right lower leg: No edema.     Left lower leg: No edema.  Skin:    General: Skin is warm and dry.  Neurological:     General: No focal deficit present.     Mental Status: She is alert and oriented to person, place, and time.      UC Treatments / Results  Labs (all labs ordered are listed, but only abnormal results are displayed) Labs Reviewed - No data to display  EKG   Radiology No results found.  Procedures Procedures (including critical care time)  Medications Ordered in UC Medications - No data to display  Initial Impression / Assessment and Plan / UC Course  I have reviewed the triage vital signs and the nursing notes.  Pertinent labs & imaging results that were available during my care of the patient were reviewed by me and considered in my medical decision making (see chart for details).    84 yo female with somatization  disorder and chronic pain who presents with multiple complaints of pain. Today's evaluation has revealed no signs of a dangerous process. Natural history and expected course discussed.  Neurosurgeon distributed. Rest, ice, compression, and elevation  (RICE) therapy. NSAIDs per medication orders. Referral to oral surgery & pain management for treatment of her chronic issues   Today's evaluation has revealed no signs of a dangerous process. Discussed diagnosis with patient and/or guardian. Patient and/or guardian aware of their diagnosis, possible red flag symptoms to watch out for and need for close follow up. Patient and/or guardian understands verbal and written discharge instructions. Patient and/or guardian comfortable with plan and disposition.  Patient and/or guardian has a clear mental status at this time, good insight into illness (after discussion and teaching) and has clear judgment to make decisions regarding their care  This care was provided during an unprecedented National Emergency due to the Novel Coronavirus (COVID-19) pandemic. COVID-19 infections and transmission risks place heavy strains on healthcare resources.  As this pandemic evolves, our facility, providers, and staff strive to respond fluidly, to remain operational, and to provide care relative to available resources and information. Outcomes are unpredictable and treatments are without well-defined guidelines. Further, the impact of COVID-19 on all aspects of urgent care, including the impact to patients seeking care for reasons other than COVID-19, is unavoidable during this national emergency. At this time of the global pandemic, management of patients has significantly changed, even for non-COVID positive patients given high local and regional COVID volumes at this time requiring high healthcare system and resource utilization. The standard of care for management of both COVID suspected and non-COVID suspected patients continues to change rapidly at the local, regional, national, and global levels. This patient was worked up and treated to the best available but ever changing evidence and resources available at this current time.   Documentation was completed with the aid of  voice recognition software. Transcription may contain typographical errors.  Final Clinical Impressions(s) / UC Diagnoses   Final diagnoses:  Dental caries  Arthralgia of multiple sites  Chronic pain syndrome     Discharge Instructions     Take medications as prescribed. Follow up with oral surgery and pain management     ED Prescriptions    Medication Sig Dispense Auth. Provider   diclofenac sodium (VOLTAREN) 1 % GEL Apply 2 g topically 4 (four) times daily. Apply to all affected areas 150 g Enrique Sack, FNP   traMADol (ULTRAM) 50 MG tablet Take 1 tablet (50 mg total) by mouth every 12 (twelve) hours as needed. 12 tablet Enrique Sack, FNP   clindamycin (CLEOCIN) 150 MG capsule Take 1 capsule (150 mg total) by mouth every 6 (six) hours. 28 capsule Enrique Sack, FNP     I have reviewed the PDMP during this encounter.   Enrique Sack, [place] 11/15/18 [place], [place], [place] 11/15/18 1134

## 2018-11-15 NOTE — Discharge Instructions (Signed)
Take medications as prescribed. Follow up with oral surgery and pain management

## 2018-12-26 ENCOUNTER — Other Ambulatory Visit: Payer: Self-pay | Admitting: Family Medicine

## 2019-02-05 ENCOUNTER — Other Ambulatory Visit: Payer: Self-pay | Admitting: Family Medicine

## 2019-02-05 DIAGNOSIS — J4540 Moderate persistent asthma, uncomplicated: Secondary | ICD-10-CM

## 2019-04-08 ENCOUNTER — Other Ambulatory Visit: Payer: Self-pay

## 2019-04-08 ENCOUNTER — Ambulatory Visit (HOSPITAL_COMMUNITY)
Admission: EM | Admit: 2019-04-08 | Discharge: 2019-04-08 | Disposition: A | Discharge status: Home / Self Care | Payer: Medicare Other | Attending: Family Medicine | Admitting: Family Medicine

## 2019-04-08 ENCOUNTER — Telehealth: Payer: Self-pay | Admitting: Family Medicine

## 2019-04-08 DIAGNOSIS — E871 Hypo-osmolality and hyponatremia: Secondary | ICD-10-CM | POA: Diagnosis not present

## 2019-04-08 DIAGNOSIS — K0889 Other specified disorders of teeth and supporting structures: Secondary | ICD-10-CM | POA: Diagnosis not present

## 2019-04-08 DIAGNOSIS — Z20822 Contact with and (suspected) exposure to covid-19: Secondary | ICD-10-CM | POA: Insufficient documentation

## 2019-04-08 DIAGNOSIS — Z86711 Personal history of pulmonary embolism: Secondary | ICD-10-CM | POA: Insufficient documentation

## 2019-04-08 DIAGNOSIS — Z79899 Other long term (current) drug therapy: Secondary | ICD-10-CM | POA: Diagnosis not present

## 2019-04-08 DIAGNOSIS — D509 Iron deficiency anemia, unspecified: Secondary | ICD-10-CM | POA: Insufficient documentation

## 2019-04-08 DIAGNOSIS — I10 Essential (primary) hypertension: Secondary | ICD-10-CM | POA: Diagnosis not present

## 2019-04-08 DIAGNOSIS — E7800 Pure hypercholesterolemia, unspecified: Secondary | ICD-10-CM | POA: Insufficient documentation

## 2019-04-08 DIAGNOSIS — J449 Chronic obstructive pulmonary disease, unspecified: Secondary | ICD-10-CM | POA: Diagnosis not present

## 2019-04-08 DIAGNOSIS — E039 Hypothyroidism, unspecified: Secondary | ICD-10-CM | POA: Diagnosis not present

## 2019-04-08 DIAGNOSIS — G8929 Other chronic pain: Secondary | ICD-10-CM | POA: Insufficient documentation

## 2019-04-08 DIAGNOSIS — J0100 Acute maxillary sinusitis, unspecified: Secondary | ICD-10-CM | POA: Diagnosis not present

## 2019-04-08 MED ORDER — AMOXICILLIN-POT CLAVULANATE 875-125 MG PO TABS: 1.0000 | ORAL_TABLET | Freq: Two times a day (BID) | ORAL | 0 refills | Status: AC

## 2019-04-08 NOTE — ED Provider Notes (Signed)
[place]    CSN: [id number] Arrival date & time: 04/08/19  1138      History   Chief Complaint Chief Complaint  Patient presents with  . Nasal Congestion  . Mouth Lesions  . Dental Pain    HPI [name] is a 85 y.o. female history of osteoporosis, asthma, GERD, diverticulitis, hypothyroidism, presenting today for evaluation of cough congestion and sputum production.  Patient states that over the past week she has developed some sinus congestion and pressure in her sinuses, recently in the past 2 days her symptoms have worsened and is noticing a green mucus.  She is also developed some soreness in her chest, but denies any significant shortness of breath.  She denies any fevers and chills, but has felt generalized weakness.  Notes that she has had multiple dental issues of recently and she feels this may be stemming from that.  She also expresses concerns over fungal infections in her mouth from inhaled corticosteroid use.  She uses Flovent twice daily.  Has not needed albuterol as recently.  Reports that she cannot take any allergy, nasal spray or decongestants as this causes her heart to race and felt very short of breath.  HPI  Past Medical History:  Diagnosis Date  . ABDOMINAL PAIN, CHRONIC 05/28/2008  . ALLERGIC RHINITIS 09/21/2006  . ANEMIA-NOS 09/21/2006  . ASTHMA 09/21/2006  . ASYMPTOMATIC POSTMENOPAUSAL STATUS 05/28/2008  . Cataract   . CHEST PAIN, ATYPICAL 05/28/2008  . COPD (chronic obstructive pulmonary disease) ([place])   . DDD (degenerative disc disease)   . DIVERTICULITIS, HX OF 09/21/2006  . GERD 09/21/2006  . HYPERCHOLESTEROLEMIA 06/05/2009  . HYPERTENSION 09/21/2006  . HYPOTHYROIDISM 09/21/2006  . Peptic ulcer disease   . Purpura ([place])   . RENAL INSUFFICIENCY 09/21/2006  . Shingles   . Small bowel obstruction ([place])   . Somatization disorder 04/25/2007  . UNSPECIFIED URINARY CALCULUS 05/28/2008    Patient Active Problem List   Diagnosis Date Noted  .  Arthralgia 08/21/2017  . Osteoporosis 11/21/2016  . Postmenopausal status 09/06/2016  . Other nonspecific abnormal finding of lung field 08/14/2015  . Hyponatremia 07/21/2014  . Menopausal state 07/18/2014  . Hematest positive stools 08/05/2013  . Anemia, blood loss 08/05/2013  . Cerumen impaction 06/03/2013  . History of adrenal disorder 10/15/2012  . Rash and nonspecific skin eruption 10/15/2012  . Iron deficiency anemia 06/20/2012  . Psychogenic polydipsia 06/20/2012  . Nonspecific abnormal electrocardiogram (ECG) (EKG) 06/20/2012  . Polyuria 05/02/2012  . Pelvic pain in female 08/16/2011  . Leg ulcer ([place]) 06/16/2010  . Routine general medical examination at a health care facility 06/11/2010  . Encounter for long-term (current) use of other medications 06/11/2010  . HYPERCHOLESTEROLEMIA 06/05/2009  . UNSPECIFIED URINARY CALCULUS 05/28/2008  . CHEST PAIN, ATYPICAL 05/28/2008  . ABDOMINAL PAIN, CHRONIC 05/28/2008  . ASYMPTOMATIC POSTMENOPAUSAL STATUS 05/28/2008  . SOMATIZATION DISORDER 04/25/2007  . Hypothyroidism 09/21/2006  . Essential hypertension 09/21/2006  . ALLERGIC RHINITIS 09/21/2006  . ASTHMA 09/21/2006  . GERD 09/21/2006  . Disorder resulting from impaired renal function 09/21/2006  . DIVERTICULITIS, HX OF 09/21/2006    Past Surgical History:  Procedure Laterality Date  . ABDOMINAL HYSTERECTOMY  1981  . acute Nephritis  1958  . APPENDECTOMY  1959  . Asthma & Pneumonia Birth  1970's  . CESAREAN SECTION    . CHOLECYSTECTOMY  1983  . COLON SURGERY    . DENTAL SURGERY  1970  . NASAL SINUS SURGERY  1967  .  Pulmonary thrombosis    . TONSILLECTOMY    . TUBAL LIGATION      OB History   No obstetric history on file.      Home Medications    Prior to Admission medications   Medication Sig Start Date End Date Taking? Authorizing Provider  fluticasone (FLOVENT HFA) 110 MCG/ACT inhaler TAKE 1 PUFF BY MOUTH TWICE A DAY 08/31/18  Yes Koberlein, Junell C, MD    levothyroxine (SYNTHROID) 75 MCG tablet Take by mouth 2 days per week 08/31/18  Yes Koberlein, Junell C, MD  PROAIR HFA 108 (90 Base) MCG/ACT inhaler INHALE 2 PUFFS INTO THE LUNGS EVERY 4 HOURS AS NEEDED FOR WHEEZING OR SHORTNESS OF BREATH 02/05/19  Yes Koberlein, Junell C, MD  amoxicillin-clavulanate (AUGMENTIN) 875-125 MG tablet Take 1 tablet by mouth every 12 (twelve) hours for 7 days. 04/08/19 04/15/19  Quinisha Mould C, PA-C  chlorhexidine (PERIDEX) 0.12 % solution USE AS DIRECTED. SWISH AND SPIT 15 MLS IN THE MOUTH OR THROAT 2 (TWO) TIMES DAILY *DO NOT SWALLOW* 12/26/18   Koberlein, Steele Berg, MD  diclofenac sodium (VOLTAREN) 1 % GEL Apply 2 g topically 4 (four) times daily. Apply to all affected areas 11/15/18   Enrique Sack, FNP  Ferrous Sulfate (IRON) 325 (65 FE) MG TABS Take 1 tablet by mouth daily.     [provider]  levothyroxine (SYNTHROID) 50 MCG tablet TAKE 1 TABLET BY MOUTH EVERY DAY 12/26/18   Caren Macadam, MD  Spacer/Aero-Holding Chambers (E-Z SPACER) inhaler Use as instructed 11/08/17   Caren Macadam, MD    Family History Family History  Problem Relation Age of Onset  . Cancer Mother        pancreatic  . Polycystic kidney disease Mother   . Cancer Father        bladder  . Other Father        Schamberg disease  . Marfan syndrome Son   . Hemochromatosis Son   . Cirrhosis Son   . Allergic rhinitis Sister   . Other Brother        bone issue as child; multiple fractures but seemed to age out of this  . Hemochromatosis Cousin   . Arthritis Sister     Social History Social History   Tobacco Use  . Smoking status: Never Smoker  . Smokeless tobacco: Never Used  Substance Use Topics  . Alcohol use: No  . Drug use: No     Allergies   Fluconazole, Metronidazole, Macrobid [nitrofurantoin], Pantoprazole, Pepcid [famotidine], and Protonix [pantoprazole sodium]   Review of Systems Review of Systems  Constitutional: Positive for fatigue.  Negative for activity change, appetite change, chills and fever.  HENT: Positive for congestion, rhinorrhea, sinus pressure and sore throat. Negative for ear pain and trouble swallowing.   Eyes: Negative for discharge and redness.  Respiratory: Positive for cough and chest tightness. Negative for shortness of breath.   Cardiovascular: Negative for chest pain.  Gastrointestinal: Negative for abdominal pain, diarrhea, nausea and vomiting.  Musculoskeletal: Negative for myalgias.  Skin: Negative for rash.  Neurological: Negative for dizziness, light-headedness and headaches.     Physical Exam Triage Vital Signs ED Triage Vitals  Enc Vitals Group     BP 04/08/19 1207 (!) 156/88     Pulse Rate 04/08/19 1207 81     Resp 04/08/19 1207 20     Temp 04/08/19 1207 98.5 F (36.9 C)     Temp src --  SpO2 04/08/19 1207 98 %     Weight --      Height --      Head Circumference --      Peak Flow --      Pain Score 04/08/19 1202 6     Pain Loc --      Pain Edu? --      Excl. in [place]? --    No data found.  Updated Vital Signs BP (!) 156/88 (BP Location: Left Arm)   Pulse 81   Temp 98.5 F (36.9 C)   Resp 20   SpO2 98%   Visual Acuity Right Eye Distance:   Left Eye Distance:   Bilateral Distance:    Right Eye Near:   Left Eye Near:    Bilateral Near:     Physical Exam Vitals and nursing note reviewed.  Constitutional:      General: She is not in acute distress.    Appearance: She is well-developed.  HENT:     Head: Normocephalic and atraumatic.     Ears:     Comments: Bilateral ears without tenderness to palpation of external auricle, tragus and mastoid, EAC's without erythema or swelling, TM's with good bony landmarks and cone of light. Non erythematous.     Mouth/Throat:     Comments: Oral mucosa pink and moist, no tonsillar enlargement or exudate. Posterior pharynx patent and nonerythematous, no uvula deviation or swelling. Normal phonation.  Erythematous lesion  noted to inner lower lip Eyes:     Conjunctiva/sclera: Conjunctivae normal.  Cardiovascular:     Rate and Rhythm: Normal rate and regular rhythm.     Heart sounds: No murmur.  Pulmonary:     Effort: Pulmonary effort is normal. No respiratory distress.     Breath sounds: Normal breath sounds.     Comments: Speaking in full sentences, no coughing during visit,Breathing comfortably at rest, CTABL, no wheezing, rales or other adventitious sounds auscultated  Anterior chest diffusely tender to palpation bilaterally Abdominal:     Palpations: Abdomen is soft.     Tenderness: There is no abdominal tenderness.  Musculoskeletal:     Cervical back: Neck supple.  Skin:    General: Skin is warm and dry.  Neurological:     Mental Status: She is alert.      UC Treatments / Results  Labs (all labs ordered are listed, but only abnormal results are displayed) Labs Reviewed  NOVEL CORONAVIRUS, NAA (HOSP ORDER, SEND-OUT TO REF LAB; TAT 18-24 HRS)    EKG   Radiology No results found.  Procedures Procedures (including critical care time)  Medications Ordered in UC Medications - No data to display  Initial Impression / Assessment and Plan / UC Course  I have reviewed the triage vital signs and the nursing notes.  Pertinent labs & imaging results that were available during my care of the patient were reviewed by me and considered in my medical decision making (see chart for details).    1. Sinusitis Covid PCR pending.  Given patient's reported symptoms, will initiate on Augmentin twice daily x1 week to cover for sinus infection, as well as infection of the lungs.  Lungs are clear, do not suspect underlying pneumonia.  Chest discomfort reproducible to palpation, most likely chest wall inflammation.  Patient reports intolerance to many over-the-counter/allergy medicines.  We will have patient continue typical medicines and continue to monitor.  Discussed strict return precautions. Patient  verbalized understanding and is agreeable with plan.  Final  Clinical Impressions(s) / UC Diagnoses   Final diagnoses:  Acute maxillary sinusitis, recurrence not specified     Discharge Instructions     Begin Augmentin twice daily for 1 week Continue normal inhalers Tylenol for chest discomfort Follow up if not improving, developing increased difficulty breathing, chest pain   ED Prescriptions    Medication Sig Dispense Auth. Provider   amoxicillin-clavulanate (AUGMENTIN) 875-125 MG tablet Take 1 tablet by mouth every 12 (twelve) hours for 7 days. 14 tablet Yer Olivencia, [place] C, PA-C     PDMP not reviewed this encounter.   Janith Lima, [place] 04/08/19 1257

## 2019-04-08 NOTE — ED Triage Notes (Signed)
Pt states she recently had multiple teeth extracted and reports her DDS told her she had a "fungal infection in her mouth". Pt states she has anaphylaxis to anti-fungal medication but has been taking only "one or two here and there". Pt reports green sputum with productive  cough of same. Also c/o pain across front of face and "chest congestion". Able to speak long sentences w/o difficulty. Denies fever, chills. But c/o general weakness.

## 2019-04-08 NOTE — Telephone Encounter (Signed)
Patient states she has congestion and drainage from her sinuses,  She was offered a telephone and virtual visit 3 times, however patient decided to go to the hospital.

## 2019-04-08 NOTE — Discharge Instructions (Signed)
Begin Augmentin twice daily for 1 week Continue normal inhalers Tylenol for chest discomfort Follow up if not improving, developing increased difficulty breathing, chest pain

## 2019-04-10 LAB — NOVEL CORONAVIRUS, NAA (HOSP ORDER, SEND-OUT TO REF LAB; TAT 18-24 HRS): SARS-CoV-2, NAA: NOT DETECTED

## 2019-04-29 ENCOUNTER — Telehealth: Payer: Self-pay | Admitting: Family Medicine

## 2019-04-29 NOTE — Telephone Encounter (Signed)
Pt is requesting a lab order because she feels very weak and anemia runs in her family. Pt is not sure if she needs to be seen first or if she can get the lab order and then see Dr. Ethlyn Gallery? Thanks

## 2019-04-30 ENCOUNTER — Other Ambulatory Visit: Payer: Self-pay

## 2019-04-30 ENCOUNTER — Encounter: Payer: Self-pay | Admitting: Internal Medicine

## 2019-04-30 ENCOUNTER — Ambulatory Visit (INDEPENDENT_AMBULATORY_CARE_PROVIDER_SITE_OTHER): Payer: Medicare Other | Admitting: Internal Medicine

## 2019-04-30 VITALS — BP 140/80 | HR 87 | Temp 97.8°F | Wt 144.3 lb

## 2019-04-30 DIAGNOSIS — R29898 Other symptoms and signs involving the musculoskeletal system: Secondary | ICD-10-CM

## 2019-04-30 DIAGNOSIS — L659 Nonscarring hair loss, unspecified: Secondary | ICD-10-CM

## 2019-04-30 LAB — COMPREHENSIVE METABOLIC PANEL
ALT: 10 U/L (ref 0–35)
AST: 19 U/L (ref 0–37)
Albumin: 4.1 g/dL (ref 3.5–5.2)
Alkaline Phosphatase: 43 U/L (ref 39–117)
BUN: 14 mg/dL (ref 6–23)
CO2: 29 mEq/L (ref 19–32)
Calcium: 9.9 mg/dL (ref 8.4–10.5)
Chloride: 97 mEq/L (ref 96–112)
Creatinine, Ser: 0.54 mg/dL (ref 0.40–1.20)
GFR: 107.81 mL/min (ref >60.00)
Glucose, Bld: 106 mg/dL — ABNORMAL HIGH (ref 70–99)
Potassium: 4.7 mEq/L (ref 3.5–5.1)
Sodium: 130 mEq/L — ABNORMAL LOW (ref 135–145)
Total Bilirubin: 0.3 mg/dL (ref 0.2–1.2)
Total Protein: 6.8 g/dL (ref 6.0–8.3)

## 2019-04-30 LAB — TSH: TSH: 1.66 u[IU]/mL (ref 0.35–4.50)

## 2019-04-30 LAB — CBC WITH DIFFERENTIAL/PLATELET
Basophils Absolute: 0.1 10*3/uL (ref 0.0–0.1)
Basophils Relative: 1.1 % (ref 0.0–3.0)
Eosinophils Absolute: 0.5 10*3/uL (ref 0.0–0.7)
Eosinophils Relative: 7.4 % — ABNORMAL HIGH (ref 0.0–5.0)
HCT: 34.5 % — ABNORMAL LOW (ref 36.0–46.0)
Hemoglobin: 11.6 g/dL — ABNORMAL LOW (ref 12.0–15.0)
Lymphocytes Relative: 25.5 % (ref 12.0–46.0)
Lymphs Abs: 1.8 10*3/uL (ref 0.7–4.0)
MCHC: 33.6 g/dL (ref 30.0–36.0)
MCV: 95.2 fl (ref 78.0–100.0)
Monocytes Absolute: 0.9 10*3/uL (ref 0.1–1.0)
Monocytes Relative: 13 % — ABNORMAL HIGH (ref 3.0–12.0)
Neutro Abs: 3.8 10*3/uL (ref 1.4–7.7)
Neutrophils Relative %: 53 % (ref 43.0–77.0)
Platelets: 329 10*3/uL (ref 150.0–400.0)
RBC: 3.62 Mil/uL — ABNORMAL LOW (ref 3.87–5.11)
RDW: 13.1 % (ref 11.5–15.5)
WBC: 7.1 10*3/uL (ref 4.0–10.5)

## 2019-04-30 LAB — VITAMIN B12: Vitamin B-12: 452 pg/mL (ref 211–911)

## 2019-04-30 LAB — VITAMIN D 25 HYDROXY (VIT D DEFICIENCY, FRACTURES): VITD: 21.66 ng/mL — ABNORMAL LOW (ref 30.00–100.00)

## 2019-04-30 NOTE — Progress Notes (Signed)
Established Patient Office Visit     This visit occurred during the SARS-CoV-2 public health emergency.  Safety protocols were in place, including screening questions prior to the visit, additional usage of staff PPE, and extensive cleaning of exam room while observing appropriate contact time as indicated for disinfecting solutions.    CC/Reason for Visit: Leg weakness  HPI: [name] is a 85 y.o. female who is coming in today for the above mentioned reasons. Past Medical History is significant for: Hypertension, hypothyroidism and osteoporosis.  She comes in today with a 2 to 3-day history of progressive bilateral leg weakness.  She has had several close calls with falls over this timeframe.  She has not had syncope or loss of consciousness.  She denies focal deficits.  She does tell me that her hair has been falling out in clumps and it feels thin.  She has a family history of pernicious anemia in her father.  She is concerned about her thyroid and B12 levels being off.  She has had a lot of dental work recently has had several rounds of antibiotics for this and for sinus infections and wonders if the antibiotic therapy could be to blame.  She denies chest pains, palpitations, shortness of breath with exertion.  No mental confusion, no blurry or double vision, no slurred speech.   Past Medical/Surgical History: Past Medical History:  Diagnosis Date  . ABDOMINAL PAIN, CHRONIC 05/28/2008  . ALLERGIC RHINITIS 09/21/2006  . ANEMIA-NOS 09/21/2006  . ASTHMA 09/21/2006  . ASYMPTOMATIC POSTMENOPAUSAL STATUS 05/28/2008  . Cataract   . CHEST PAIN, ATYPICAL 05/28/2008  . COPD (chronic obstructive pulmonary disease) ([place])   . DDD (degenerative disc disease)   . DIVERTICULITIS, HX OF 09/21/2006  . GERD 09/21/2006  . HYPERCHOLESTEROLEMIA 06/05/2009  . HYPERTENSION 09/21/2006  . HYPOTHYROIDISM 09/21/2006  . Peptic ulcer disease   . Purpura ([place])   . RENAL INSUFFICIENCY 09/21/2006  . Shingles   .  Small bowel obstruction ([place])   . Somatization disorder 04/25/2007  . UNSPECIFIED URINARY CALCULUS 05/28/2008    Past Surgical History:  Procedure Laterality Date  . ABDOMINAL HYSTERECTOMY  1981  . acute Nephritis  1958  . APPENDECTOMY  1959  . Asthma & Pneumonia Birth  1970's  . CESAREAN SECTION    . CHOLECYSTECTOMY  1983  . COLON SURGERY    . DENTAL SURGERY  1970  . NASAL SINUS SURGERY  1967  . Pulmonary thrombosis    . TONSILLECTOMY    . TUBAL LIGATION      Social History:  reports that she has never smoked. She has never used smokeless tobacco. She reports that she does not drink alcohol or use drugs.  Allergies: Allergies  Allergen Reactions  . Fluconazole Shortness Of Breath  . Metronidazole Shortness Of Breath and Nausea And Vomiting  . Macrobid [Nitrofurantoin]     REACTION: Syncope  . Pantoprazole Other (See Comments)    CHEST PAIN  . Pepcid [Famotidine]   . Protonix [Pantoprazole Sodium]     Family History:  Family History  Problem Relation Age of Onset  . Cancer Mother        pancreatic  . Polycystic kidney disease Mother   . Cancer Father        bladder  . Other Father        Schamberg disease  . Marfan syndrome Son   . Hemochromatosis Son   . Cirrhosis Son   . Allergic rhinitis Sister   .  Other Brother        bone issue as child; multiple fractures but seemed to age out of this  . Hemochromatosis Cousin   . Arthritis Sister      Current Outpatient Medications:  .  chlorhexidine (PERIDEX) 0.12 % solution, USE AS DIRECTED. SWISH AND SPIT 15 MLS IN THE MOUTH OR THROAT 2 (TWO) TIMES DAILY *DO NOT SWALLOW*, Disp: 473 mL, Rfl: 3 .  diclofenac sodium (VOLTAREN) 1 % GEL, Apply 2 g topically 4 (four) times daily. Apply to all affected areas, Disp: 150 g, Rfl: 0 .  Ferrous Sulfate (IRON) 325 (65 FE) MG TABS, Take 1 tablet by mouth daily. , Disp: , Rfl:  .  fluticasone (FLOVENT HFA) 110 MCG/ACT inhaler, TAKE 1 PUFF BY MOUTH TWICE A DAY, Disp: 36 Inhaler,  Rfl: 3 .  levothyroxine (SYNTHROID) 50 MCG tablet, TAKE 1 TABLET BY MOUTH EVERY DAY, Disp: 90 tablet, Rfl: 1 .  levothyroxine (SYNTHROID) 75 MCG tablet, Take by mouth 2 days per week, Disp: 90 tablet, Rfl: 1 .  PROAIR HFA 108 (90 Base) MCG/ACT inhaler, INHALE 2 PUFFS INTO THE LUNGS EVERY 4 HOURS AS NEEDED FOR WHEEZING OR SHORTNESS OF BREATH, Disp: 8.5 g, Rfl: 2 .  Spacer/Aero-Holding Chambers (E-Z SPACER) inhaler, Use as instructed, Disp: 1 each, Rfl: 2  Review of Systems:  Constitutional: Denies fever, chills, diaphoresis, appetite change and fatigue.  HEENT: Denies photophobia, eye pain, redness, hearing loss, ear pain, congestion, sore throat, rhinorrhea, sneezing, mouth sores, trouble swallowing, neck pain, neck stiffness and tinnitus.   Respiratory: Denies SOB, DOE, cough, chest tightness,  and wheezing.   Cardiovascular: Denies chest pain, palpitations and leg swelling.  Gastrointestinal: Denies nausea, vomiting, abdominal pain, diarrhea, constipation, blood in stool and abdominal distention.  Genitourinary: Denies dysuria, urgency, frequency, hematuria, flank pain and difficulty urinating.  Endocrine: Denies: hot or cold intolerance, sweats, polyuria, polydipsia. Musculoskeletal: Denies myalgias, back pain, joint swelling, arthralgias. Skin: Denies pallor, rash and wound.  Neurological: Denies dizziness, seizures, syncope, weakness, light-headedness, numbness and headaches.  Hematological: Denies adenopathy. Easy bruising, personal or family bleeding history  Psychiatric/Behavioral: Denies suicidal ideation, mood changes, confusion, nervousness, sleep disturbance and agitation    Physical Exam: Vitals:   04/30/19 1423  BP: 140/80  Pulse: 87  Temp: 97.8 F (36.6 C)  TempSrc: Temporal  SpO2: 95%  Weight: 144 lb 4.8 oz (65.5 kg)    Body mass index is 24.96 kg/m.   Constitutional: NAD, calm, comfortable, ambulates with a cane Eyes: PERRL, lids and conjunctivae normal ENMT:  Mucous membranes are moist. Respiratory: clear to auscultation bilaterally, no wheezing, no crackles. Normal respiratory effort. No accessory muscle use.  Cardiovascular: Regular rate and rhythm, no murmurs / rubs / gallops. No extremity edema.  Abdomen: no tenderness, no masses palpated. No hepatosplenomegaly. Bowel sounds positive.  Neurologic: Grossly intact and nonfocal  Psychiatric: Normal judgment and insight. Alert and oriented x 3. Normal mood.    Impression and Plan:  Bilateral leg weakness Hair loss   -Check CBC, c-Met, B12, TSH, vitamin D. -If lab work is normal, can possibly consider referral for physical therapy. -She does not appear depressed based on conversation today. -She will also schedule follow-up as soon as possible with PCP as she has not been seen in about 18 months due to the pandemic.   Patient Instructions  -Nice seeing you today!!  -Lab work today; will notify you once results are available.  -Schedule follow up as soon as possible with Dr. Ethlyn Gallery  for routine care.     Lelon Frohlich, MD [facility]

## 2019-04-30 NOTE — Telephone Encounter (Signed)
I called the pt and informed her Dr Ethlyn Gallery is out of the office, does not have any openings and offered an appt with another provider due to weakness.  Patient stated she has had dental work done recently, lives in an assisted living home and is concerned due to a family history of a blood problem, has felt very weak, almost fell and would like to see another provider.  Appt scheduled for today with Dr Jerilee Hoh to arrive at 2:15pm.  Patient asked if someone could assist her with getting in the office and I advised the pt if she is feeling bad, she may need to go to an urgent care or emergency room and she stated she would prefer coming in.  I also advised the pt if she has someone that she needs to come with her to the visit, they are allowed to come in as long as they are well and she stated her son will attend the visit today.

## 2019-04-30 NOTE — Patient Instructions (Signed)
-  Nice seeing you today!!  -Lab work today; will notify you once results are available.  -Schedule follow up as soon as possible with Dr. Ethlyn Gallery for routine care.

## 2019-04-30 NOTE — Telephone Encounter (Signed)
Pt wanted to check and see if Dr.Koberlein had got back to her message.

## 2019-05-01 ENCOUNTER — Encounter: Payer: Self-pay | Admitting: Internal Medicine

## 2019-05-01 ENCOUNTER — Other Ambulatory Visit: Payer: Self-pay | Admitting: Internal Medicine

## 2019-05-01 DIAGNOSIS — E871 Hypo-osmolality and hyponatremia: Secondary | ICD-10-CM

## 2019-05-01 DIAGNOSIS — E559 Vitamin D deficiency, unspecified: Secondary | ICD-10-CM

## 2019-05-01 MED ORDER — VITAMIN D (ERGOCALCIFEROL) 1.25 MG (50000 UNIT) PO CAPS: 50000.0000 [IU] | ORAL_CAPSULE | ORAL | 0 refills | Status: AC

## 2019-06-03 ENCOUNTER — Emergency Department (HOSPITAL_COMMUNITY)
Admission: EM | Admit: 2019-06-03 | Discharge: 2019-06-04 | Disposition: A | Discharge status: Home / Self Care | Payer: Medicare Other | Attending: Emergency Medicine | Admitting: Emergency Medicine

## 2019-06-03 ENCOUNTER — Other Ambulatory Visit: Payer: Self-pay

## 2019-06-03 DIAGNOSIS — Z79899 Other long term (current) drug therapy: Secondary | ICD-10-CM | POA: Diagnosis not present

## 2019-06-03 DIAGNOSIS — K5732 Diverticulitis of large intestine without perforation or abscess without bleeding: Secondary | ICD-10-CM | POA: Diagnosis not present

## 2019-06-03 DIAGNOSIS — K921 Melena: Secondary | ICD-10-CM | POA: Diagnosis present

## 2019-06-03 DIAGNOSIS — I10 Essential (primary) hypertension: Secondary | ICD-10-CM | POA: Insufficient documentation

## 2019-06-03 DIAGNOSIS — R531 Weakness: Secondary | ICD-10-CM | POA: Diagnosis not present

## 2019-06-03 DIAGNOSIS — K5792 Diverticulitis of intestine, part unspecified, without perforation or abscess without bleeding: Secondary | ICD-10-CM | POA: Diagnosis not present

## 2019-06-03 DIAGNOSIS — R1030 Lower abdominal pain, unspecified: Secondary | ICD-10-CM | POA: Insufficient documentation

## 2019-06-03 DIAGNOSIS — R109 Unspecified abdominal pain: Secondary | ICD-10-CM | POA: Diagnosis not present

## 2019-06-03 DIAGNOSIS — E039 Hypothyroidism, unspecified: Secondary | ICD-10-CM | POA: Insufficient documentation

## 2019-06-03 DIAGNOSIS — J449 Chronic obstructive pulmonary disease, unspecified: Secondary | ICD-10-CM | POA: Diagnosis not present

## 2019-06-03 DIAGNOSIS — K922 Gastrointestinal hemorrhage, unspecified: Secondary | ICD-10-CM | POA: Diagnosis not present

## 2019-06-03 LAB — CBC
HCT: 33.6 % — ABNORMAL LOW (ref 36.0–46.0)
Hemoglobin: 11 g/dL — ABNORMAL LOW (ref 12.0–15.0)
MCH: 31.7 pg (ref 26.0–34.0)
MCHC: 32.7 g/dL (ref 30.0–36.0)
MCV: 96.8 fL (ref 80.0–100.0)
Platelets: 335 10*3/uL (ref 150–400)
RBC: 3.47 MIL/uL — ABNORMAL LOW (ref 3.87–5.11)
RDW: 13 % (ref 11.5–15.5)
WBC: 7.5 10*3/uL (ref 4.0–10.5)
nRBC: 0 % (ref 0.0–0.2)

## 2019-06-03 LAB — ABO/RH: ABO/RH(D): O NEG

## 2019-06-03 LAB — COMPREHENSIVE METABOLIC PANEL
ALT: 20 U/L (ref 0–44)
AST: 31 U/L (ref 15–41)
Albumin: 3.7 g/dL (ref 3.5–5.0)
Alkaline Phosphatase: 42 U/L (ref 38–126)
Anion gap: 9 (ref 5–15)
BUN: 13 mg/dL (ref 8–23)
CO2: 24 mmol/L (ref 22–32)
Calcium: 9.7 mg/dL (ref 8.9–10.3)
Chloride: 100 mmol/L (ref 98–111)
Creatinine, Ser: 0.6 mg/dL (ref 0.44–1.00)
GFR calc Af Amer: >60 mL/min (ref >60)
GFR calc non Af Amer: >60 mL/min (ref >60)
Glucose, Bld: 106 mg/dL — ABNORMAL HIGH (ref 70–99)
Potassium: 4.3 mmol/L (ref 3.5–5.1)
Sodium: 133 mmol/L — ABNORMAL LOW (ref 135–145)
Total Bilirubin: 0.7 mg/dL (ref 0.3–1.2)
Total Protein: 6.7 g/dL (ref 6.5–8.1)

## 2019-06-03 LAB — TYPE AND SCREEN
ABO/RH(D): O NEG
Antibody Screen: NEGATIVE

## 2019-06-03 NOTE — ED Triage Notes (Signed)
Pt here for evaluation of bright red bleeding from rectum since Friday. Hx same. Pt endorses R sided abdominal pain. Pt endorses generalized weakness and severe headache.

## 2019-06-04 ENCOUNTER — Emergency Department (HOSPITAL_COMMUNITY): Payer: Medicare Other

## 2019-06-04 DIAGNOSIS — K5792 Diverticulitis of intestine, part unspecified, without perforation or abscess without bleeding: Secondary | ICD-10-CM | POA: Diagnosis not present

## 2019-06-04 DIAGNOSIS — R109 Unspecified abdominal pain: Secondary | ICD-10-CM | POA: Diagnosis not present

## 2019-06-04 LAB — CBC
HCT: 34.1 % — ABNORMAL LOW (ref 36.0–46.0)
Hemoglobin: 11.2 g/dL — ABNORMAL LOW (ref 12.0–15.0)
MCH: 31.6 pg (ref 26.0–34.0)
MCHC: 32.8 g/dL (ref 30.0–36.0)
MCV: 96.3 fL (ref 80.0–100.0)
Platelets: 332 10*3/uL (ref 150–400)
RBC: 3.54 MIL/uL — ABNORMAL LOW (ref 3.87–5.11)
RDW: 13.1 % (ref 11.5–15.5)
WBC: 7.9 10*3/uL (ref 4.0–10.5)
nRBC: 0 % (ref 0.0–0.2)

## 2019-06-04 MED ORDER — HYDROCODONE-ACETAMINOPHEN 5-325 MG PO TABS: 1.0000 | ORAL_TABLET | Freq: Four times a day (QID) | ORAL | 0 refills | Status: DC | PRN

## 2019-06-04 MED ORDER — IOHEXOL 300 MG/ML  SOLN
100.0000 mL | Freq: Once | INTRAMUSCULAR | Status: AC | PRN
  Administered 2019-06-04: 100 mL via INTRAVENOUS

## 2019-06-04 MED ORDER — AMOXICILLIN-POT CLAVULANATE 875-125 MG PO TABS: 1.0000 | ORAL_TABLET | Freq: Two times a day (BID) | ORAL | 0 refills | Status: AC

## 2019-06-04 NOTE — ED Provider Notes (Signed)
[facility] Provider Note   CSN: [id number] Arrival date & time: 06/03/19  1319     History Chief Complaint  Patient presents with  . GI Bleeding  . Abdominal Pain    [name] is a 85 y.o. female who presents with hematochezia and abdominal pain.  Patient states that about 3 days ago she noticed blood when she went to the have a bowel movement.  The blood was red and mixed with mucus.  She had another episode yesterday and then another episode today as well which was bright red and more than past episodes so she decided to come to the emergency department.  She has been in the waiting room for 16 hours and states that she has not had any further episodes of bleeding.  States that the mucus is green.  She denies fever, chills, chest pain, shortness of breath, nausea, vomiting, diarrhea or constipation.  She is having lower abdominal cramping with episodes of bleeding but denies any pain currently.  She is not on blood thinners but takes ibuprofen at times.  Last colonoscopy was in 2015 which showed diverticulosis and polyps.  HPI     Past Medical History:  Diagnosis Date  . ABDOMINAL PAIN, CHRONIC 05/28/2008  . ALLERGIC RHINITIS 09/21/2006  . ANEMIA-NOS 09/21/2006  . ASTHMA 09/21/2006  . ASYMPTOMATIC POSTMENOPAUSAL STATUS 05/28/2008  . Cataract   . CHEST PAIN, ATYPICAL 05/28/2008  . COPD (chronic obstructive pulmonary disease) ([place])   . DDD (degenerative disc disease)   . DIVERTICULITIS, HX OF 09/21/2006  . GERD 09/21/2006  . HYPERCHOLESTEROLEMIA 06/05/2009  . HYPERTENSION 09/21/2006  . HYPOTHYROIDISM 09/21/2006  . Peptic ulcer disease   . Purpura ([place])   . RENAL INSUFFICIENCY 09/21/2006  . Shingles   . Small bowel obstruction ([place])   . Somatization disorder 04/25/2007  . UNSPECIFIED URINARY CALCULUS 05/28/2008    Patient Active Problem List   Diagnosis Date Noted  . Vitamin D deficiency 05/01/2019  . Arthralgia 08/21/2017  . Osteoporosis  11/21/2016  . Postmenopausal status 09/06/2016  . Other nonspecific abnormal finding of lung field 08/14/2015  . Hyponatremia 07/21/2014  . Menopausal state 07/18/2014  . Hematest positive stools 08/05/2013  . Anemia, blood loss 08/05/2013  . Cerumen impaction 06/03/2013  . History of adrenal disorder 10/15/2012  . Rash and nonspecific skin eruption 10/15/2012  . Iron deficiency anemia 06/20/2012  . Psychogenic polydipsia 06/20/2012  . Nonspecific abnormal electrocardiogram (ECG) (EKG) 06/20/2012  . Polyuria 05/02/2012  . Pelvic pain in female 08/16/2011  . Leg ulcer ([place]) 06/16/2010  . Routine general medical examination at a health care facility 06/11/2010  . Encounter for long-term (current) use of other medications 06/11/2010  . HYPERCHOLESTEROLEMIA 06/05/2009  . UNSPECIFIED URINARY CALCULUS 05/28/2008  . CHEST PAIN, ATYPICAL 05/28/2008  . ABDOMINAL PAIN, CHRONIC 05/28/2008  . ASYMPTOMATIC POSTMENOPAUSAL STATUS 05/28/2008  . SOMATIZATION DISORDER 04/25/2007  . Hypothyroidism 09/21/2006  . Essential hypertension 09/21/2006  . ALLERGIC RHINITIS 09/21/2006  . ASTHMA 09/21/2006  . GERD 09/21/2006  . Disorder resulting from impaired renal function 09/21/2006  . DIVERTICULITIS, HX OF 09/21/2006    Past Surgical History:  Procedure Laterality Date  . ABDOMINAL HYSTERECTOMY  1981  . acute Nephritis  1958  . APPENDECTOMY  1959  . Asthma & Pneumonia Birth  1970's  . CESAREAN SECTION    . CHOLECYSTECTOMY  1983  . COLON SURGERY    . DENTAL SURGERY  1970  . NASAL SINUS SURGERY  1967  .  Pulmonary thrombosis    . TONSILLECTOMY    . TUBAL LIGATION       OB History   No obstetric history on file.     Family History  Problem Relation Age of Onset  . Cancer Mother        pancreatic  . Polycystic kidney disease Mother   . Cancer Father        bladder  . Other Father        Schamberg disease  . Marfan syndrome Son   . Hemochromatosis Son   . Cirrhosis Son   . Allergic  rhinitis Sister   . Other Brother        bone issue as child; multiple fractures but seemed to age out of this  . Hemochromatosis Cousin   . Arthritis Sister     Social History   Tobacco Use  . Smoking status: Never Smoker  . Smokeless tobacco: Never Used  Substance Use Topics  . Alcohol use: No  . Drug use: No    Home Medications Prior to Admission medications   Medication Sig Start Date End Date Taking? Authorizing Provider  acetaminophen (TYLENOL) 325 MG tablet Take 650-975 mg by mouth every 6 (six) hours as needed for mild pain or headache.   Yes [provider]  chlorhexidine (PERIDEX) 0.12 % solution USE AS DIRECTED. SWISH AND SPIT 15 MLS IN THE MOUTH OR THROAT 2 (TWO) TIMES DAILY *DO NOT SWALLOW* Patient taking differently: Use as directed 15 mLs in the mouth or throat 2 (two) times daily. *DO NOT SWALLOW* 12/26/18  Yes Koberlein, Junell C, MD  diclofenac sodium (VOLTAREN) 1 % GEL Apply 2 g topically 4 (four) times daily. Apply to all affected areas 11/15/18  Yes Enrique Sack, FNP  Ferrous Sulfate (IRON) 325 (65 FE) MG TABS Take 1 tablet by mouth daily.    Yes [provider]  fluticasone (FLOVENT HFA) 110 MCG/ACT inhaler TAKE 1 PUFF BY MOUTH TWICE A DAY Patient taking differently: Inhale 1 puff into the lungs 2 (two) times daily.  08/31/18  Yes Caren Macadam, MD  levothyroxine (SYNTHROID) 50 MCG tablet TAKE 1 TABLET BY MOUTH EVERY DAY Patient taking differently: Take 50 mcg by mouth every Monday, Tuesday, Wednesday, Thursday, and Friday.  12/26/18  Yes Koberlein, Steele Berg, MD  levothyroxine (SYNTHROID) 75 MCG tablet Take by mouth 2 days per week Patient taking differently: Take 75 mcg by mouth 2 (two) times a week. Take on Saturday and Sunday 08/31/18  Yes Koberlein, Steele Berg, MD  PROAIR HFA 108 (90 Base) MCG/ACT inhaler INHALE 2 PUFFS INTO THE LUNGS EVERY 4 HOURS AS NEEDED FOR WHEEZING OR SHORTNESS OF BREATH Patient taking differently: Inhale 2 puffs  into the lungs every 4 (four) hours as needed for wheezing or shortness of breath.  02/05/19  Yes Koberlein, Steele Berg, MD  Vitamin D, Ergocalciferol, (DRISDOL) 1.25 MG (50000 UNIT) CAPS capsule Take 1 capsule (50,000 Units total) by mouth every 7 (seven) days for 12 doses. 05/01/19 07/18/19 Yes Erline Hau, MD  zinc sulfate 220 (50 Zn) MG capsule Take 220 mg by mouth daily.   Yes [provider]  Spacer/Aero-Holding Chambers (E-Z SPACER) inhaler Use as instructed 11/08/17   Caren Macadam, MD    Allergies    [place] nut (berthollefia [place]) skin test, Fluconazole, Metronidazole, Shellfish allergy, Macrobid [nitrofurantoin], Pantoprazole, Pepcid [famotidine], and Protonix [pantoprazole sodium]  Review of Systems   Review of Systems  Constitutional: Negative for chills  and fever.  Respiratory: Negative for shortness of breath.   Cardiovascular: Negative for chest pain.  Gastrointestinal: Positive for abdominal pain. Negative for blood in stool, diarrhea, nausea, rectal pain and vomiting.       +rectal bleeding  Neurological: Positive for weakness.  All other systems reviewed and are negative.   Physical Exam Updated Vital Signs BP (!) 134/96   Pulse 94   Temp 98.5 F (36.9 C)   Resp 15   SpO2 99%   Physical Exam Vitals and nursing note reviewed.  Constitutional:      General: She is not in acute distress.    Appearance: She is well-developed. She is not ill-appearing.  HENT:     Head: Normocephalic and atraumatic.  Eyes:     General: No scleral icterus.       Right eye: No discharge.        Left eye: No discharge.     Conjunctiva/sclera: Conjunctivae normal.     Pupils: Pupils are equal, round, and reactive to light.  Cardiovascular:     Rate and Rhythm: Normal rate.  Pulmonary:     Effort: Pulmonary effort is normal. No respiratory distress.  Abdominal:     General: Abdomen is protuberant. Bowel sounds are normal. There is no distension.      Palpations: Abdomen is soft.     Tenderness: There is no abdominal tenderness.     Comments: Large well healed midline abdominal scar  Genitourinary:    Comments: Rectal: No gross blood, hemorrhoids, fissures, redness, area of fluctuance, lesions, or tenderness. Chaperone present during exam.  Musculoskeletal:     Cervical back: Normal range of motion.  Skin:    General: Skin is warm and dry.  Neurological:     Mental Status: She is alert and oriented to person, place, and time.  Psychiatric:        Behavior: Behavior normal.     ED Results / Procedures / Treatments   Labs (all labs ordered are listed, but only abnormal results are displayed) Labs Reviewed  COMPREHENSIVE METABOLIC PANEL - Abnormal; Notable for the following components:      Result Value   Sodium 133 (*)    Glucose, Bld 106 (*)    All other components within normal limits  CBC - Abnormal; Notable for the following components:   RBC 3.47 (*)    Hemoglobin 11.0 (*)    HCT 33.6 (*)    All other components within normal limits  CBC  POC OCCULT BLOOD, ED  TYPE AND SCREEN  ABO/RH    EKG None  Radiology No results found.  Procedures Procedures (including critical care time)  Medications Ordered in ED Medications - No data to display  ED Course  I have reviewed the triage vital signs and the nursing notes.  Pertinent labs & imaging results that were available during my care of the patient were reviewed by me and considered in my medical decision making (see chart for details).  85 year old female presents with lower abdominal cramping, bright red blood per rectum, and mucus for 3 days.  Patient's been in the emergency department for 16 hours prior to my evaluation.  She is hemodynamically stable and vital signs are reassuring.  Lab work obtained at 1:00 yesterday showed hemoglobin of 11 which is a slight drop from 1 month ago when it was 11.6.  CMP shows chronic mild hyponatremia.  Abdomen is soft and  nontender.  Rectal exam was performed and there is  no gross blood or melena.  Patient denies having any further episodes of bleeding since being in the ED.  Shared visit with Dr. Christy Gentles.  Will order CT abdomen and pelvis and obtain repeat CBC.  Repeat hemoglobin is stable.  At shift change CT is pending. Anticipate d/c home. Care signed out to Lyndal Rainbow PA-C  MDM Rules/Calculators/A&P  Final Clinical Impression(s) / ED Diagnoses Final diagnoses:  Lower GI bleed    Rx / DC Orders ED Discharge Orders    None       Recardo Evangelist, PA-C 06/04/19 [id number]    Ripley Fraise, MD 06/04/19 [phone]

## 2019-06-04 NOTE — ED Notes (Signed)
Patient given discharge instructions. Questions were answered. Patient verbalized understanding of discharge instructions and care at home.  Pt discharged with son.

## 2019-06-04 NOTE — Discharge Instructions (Addendum)
Take antibiotics as prescribed.  Take the entire course, even if your symptoms improve. Use Tylenol or ibuprofen as needed for mild to moderate pain. Use Norco as needed for severe breakthrough pain.  Have caution, this may make you tired or groggy.  It will increase your risk for falls. Follow-up with your stomach doctor after his symptoms improved for reevaluation. Return to the emergency room if you develop fevers, severe worsening pain, or any new, worsening, or concerning symptoms.

## 2019-06-04 NOTE — ED Provider Notes (Signed)
  Physical Exam  BP (!) 149/77   Pulse 89   Temp 98.5 F (36.9 C)   Resp 15   SpO2 98%   Physical Exam  Gen: appears nontoxic   ED Course/Procedures     Procedures  MDM  Pt signed out to me by Raquel James, PA-C. Please see previous notes for further history.   In brief, pt presenting for evaluation of intermittent lower abd cramping and 3 episodes of BRBPR. No diarrhea. H/o diverticulosis. She is hemodynamically stable, has had 2 reassuring hemoglobin reads several hrs apart. She has had no blood in the 17+ hrs she has been in the ED. She is not on anticoagulation. Her rectal exam was grossly negative. Due to her cramping and history, CT pending to r/o diverticulitis. If CT is negative and pt remains nontoxic, plan for d/c with GI f/u (pt sees [facility]).   CT consistent with acute diverticulitis.  Discussed findings with patient and patient's son.  Discussed treatment with pain control and antibiotics.  Encourage close follow-up with GI, as mass cannot be excluded on CT.  Encouraged close monitoring of symptoms, and prompt return with any worsening signs of infection.  At this time, patient appears safe for discharge.  Return precautions given.  Patient states she understands and agrees to plan.        Franchot Heidelberg, PA-C 06/04/19 1520    Maudie Flakes, MD 06/05/19 [phone]

## 2019-06-04 NOTE — ED Provider Notes (Signed)
Patient seen/examined in the Emergency Department in conjunction with Advanced Practice Provider [facility] Patient reports lower abdominal pain and bloody stools Exam : awake/alert, no distress, mild diffuse lower abdominal tenderness Plan: HGB stable. Will recheck CBC.  Plan for CT imaging due to acute abd. pain    Ripley Fraise, MD 06/04/19 [phone]

## 2019-06-10 ENCOUNTER — Other Ambulatory Visit: Payer: Self-pay

## 2019-06-18 ENCOUNTER — Ambulatory Visit (INDEPENDENT_AMBULATORY_CARE_PROVIDER_SITE_OTHER): Payer: Medicare Other | Admitting: Nurse Practitioner

## 2019-06-18 ENCOUNTER — Encounter: Payer: Self-pay | Admitting: Nurse Practitioner

## 2019-06-18 VITALS — BP 126/72 | HR 80 | Temp 98.2°F | Ht 62.5 in | Wt 145.1 lb

## 2019-06-18 DIAGNOSIS — K625 Hemorrhage of anus and rectum: Secondary | ICD-10-CM

## 2019-06-18 DIAGNOSIS — K648 Other hemorrhoids: Secondary | ICD-10-CM | POA: Diagnosis not present

## 2019-06-18 DIAGNOSIS — K5792 Diverticulitis of intestine, part unspecified, without perforation or abscess without bleeding: Secondary | ICD-10-CM | POA: Diagnosis not present

## 2019-06-18 MED ORDER — HYDROCORTISONE (PERIANAL) 2.5 % EX CREA: 1.0000 "application " | TOPICAL_CREAM | Freq: Every day | CUTANEOUS | 0 refills | Status: AC

## 2019-06-18 MED ORDER — AMOXICILLIN-POT CLAVULANATE 875-125 MG PO TABS: 1.0000 | ORAL_TABLET | Freq: Two times a day (BID) | ORAL | 0 refills | Status: AC

## 2019-06-18 NOTE — Progress Notes (Signed)
ASSESSMENT / PLAN:   85 year old female with PMH significant for remote gastritis, remote esophageal stricture, diverticulosis , hypertension, hypothyroidism, osteoporosis, purpura  # Acute diverticulitis --CT scan in ED 06/03/19 remarkable for long segment thickening and mucosal edema of the rectosigmoid colon containing numerous diverticula suspicious for acute diverticulitis. No free fluid, abscess, or evidence of perforation. --Abdominal pain has improved after completion of antibiotics but she is still having some intermittent lower abdominal pain and is tender on exam.  --I am going to give her another 7 days of Augmentin.  --Return for follow-up in 2 to 3 weeks, sooner if needed --Known diverticulosis seen on CT scan in 2015 but this was her initial episode of diverticulitis.  --She is not particularly interested in a colonoscopy.  After total resolution of pain we can address further.  We could consider repeating CT scan to make sure findings have resolved  # Rectal bleeding / Internal hemorrhoids --Lower abdominal pain, loose stools with blood when she presented to ED 4/26.  CT suggested diverticulitis.  --Not clear why she was having loose stool at the time but bleeding could have been secondary to internal hemorrhoids as seen on anoscopy today.  --Of course with the CT scan findings and bleeding colon neoplasm is always a possibility --Treat hemorrhoids with Anusol cream x10 days --Patient will return to see me in 2 to 3 weeks.  --She is not interested in colonoscopy  # Chronic normocytic anemia --Evaluation of anemia in 2015 by Dr. Deatra Ina.  EGD and colonoscopy unrevealing --Hemoglobin stable at 11.2 on oral iron  HPI:     Chief Complaint: Follow-up on diverticulitis, was in ED   [name] is an 85 year old female followed years ago by Dr. Deatra Ina, not seen in several years.  She was in the emergency department 06/03/2018 evaluation of lower abdominal  cramping and bright red blood per rectum.  Labs were reassuring.  Hemoglobin was 11 on arrival and 11.2 prior to discharge from the ED. No rectal bleeding in the 17 hours that she was in the ED. Rectal exam was grossly negative.  Discharged home on amoxicillin and hydrocodone  Patient completed 10 days of amoxicillin.  She is still having intermittent lower abdominal pain but not near as severe as what it was when seen in the ED.  No fevers. Patient says when she went to the ED her stools were loose with blood.  After leaving the ED she continued to have intermittent rectal bleeding up until 2 days ago.  She is drinking plenty of fluids and on a soft diet.  Her stools are now partially formed without blood.  Overall she feels weak   Data Reviewed:   06/04/19 CTAP w/ contrast in ED IMPRESSION: 1. Long segment thickening and mucosal edema of the rectosigmoid colon which contain numerous diverticula suspicious for acute diverticulitis. No free fluid, abscess, or evidence of perforation. A follow-up colonoscopy after appropriate treatment is suggested to exclude the presence of an underlying mass. 2. Moderate hiatal hernia. 3. 4 mm juxtapleural nodule within the lateral aspect of the right lower lobe. No follow-up needed if patient is low-risk. Non-contrast chest CT can be considered in 12 months if patient is high-risk.  Previous endoscopic evaluations  08/15/13 Colonoscopy and EGD for occult blood in stool / IDA --ENDOSCOPIC IMPRESSION: 1. abnormal mucosa in the rectal vault-rule out adenomatous changes versus proctitis - a source for Hemoccult-positive stool 2. Diverticulosis  08/25/13 EGD --gastritis and esophageal stricture.   Surgical [P], rectum, polyp - BENIGN LYMPHOID POLYPS. NO ADENOMATOUS CHANGE OR MALIGNANCY. 2. Surgical [P], gastric body, biopsy - SLIGHT CHRONIC GASTRITIS. NO HELICOBACTER PYLORI, DYSPLASIA OR EVIDENCE OF MALIGNANCY IDENTIFIED  Past Medical History:    Diagnosis Date  . ABDOMINAL PAIN, CHRONIC 05/28/2008  . ALLERGIC RHINITIS 09/21/2006  . ANEMIA-NOS 09/21/2006  . ASTHMA 09/21/2006  . ASYMPTOMATIC POSTMENOPAUSAL STATUS 05/28/2008  . Cataract   . CHEST PAIN, ATYPICAL 05/28/2008  . COPD (chronic obstructive pulmonary disease) ([place])   . DDD (degenerative disc disease)   . DIVERTICULITIS, HX OF 09/21/2006  . GERD 09/21/2006  . HYPERCHOLESTEROLEMIA 06/05/2009  . HYPERTENSION 09/21/2006  . HYPOTHYROIDISM 09/21/2006  . Peptic ulcer disease   . Purpura ([place])   . RENAL INSUFFICIENCY 09/21/2006  . Shingles   . Small bowel obstruction ([place])   . Somatization disorder 04/25/2007  . UNSPECIFIED URINARY CALCULUS 05/28/2008     Past Surgical History:  Procedure Laterality Date  . ABDOMINAL HYSTERECTOMY  1981  . acute Nephritis  1958  . APPENDECTOMY  1959  . Asthma & Pneumonia Birth  1970's  . CESAREAN SECTION    . CHOLECYSTECTOMY  1983  . COLON SURGERY    . DENTAL SURGERY  1970  . NASAL SINUS SURGERY  1967  . Pulmonary thrombosis    . TONSILLECTOMY    . TUBAL LIGATION     Family History  Problem Relation Age of Onset  . Cancer Mother        pancreatic  . Polycystic kidney disease Mother   . Cancer Father        bladder  . Other Father        Schamberg disease  . Marfan syndrome Son   . Hemochromatosis Son   . Cirrhosis Son   . Allergic rhinitis Sister   . Other Brother        bone issue as child; multiple fractures but seemed to age out of this  . Hemochromatosis Cousin   . Arthritis Sister    Social History   Tobacco Use  . Smoking status: Never Smoker  . Smokeless tobacco: Never Used  Substance Use Topics  . Alcohol use: No  . Drug use: No   Current Outpatient Medications  Medication Sig Dispense Refill  . acetaminophen (TYLENOL) 325 MG tablet Take 650-975 mg by mouth every 6 (six) hours as needed for mild pain or headache.    . chlorhexidine (PERIDEX) 0.12 % solution USE AS DIRECTED. SWISH AND SPIT 15 MLS IN THE MOUTH OR  THROAT 2 (TWO) TIMES DAILY *DO NOT SWALLOW* (Patient taking differently: Use as directed 15 mLs in the mouth or throat 2 (two) times daily. *DO NOT SWALLOW*) 473 mL 3  . diclofenac sodium (VOLTAREN) 1 % GEL Apply 2 g topically 4 (four) times daily. Apply to all affected areas 150 g 0  . Ferrous Sulfate (IRON) 325 (65 FE) MG TABS Take 1 tablet by mouth daily.     . fluticasone (FLOVENT HFA) 110 MCG/ACT inhaler TAKE 1 PUFF BY MOUTH TWICE A DAY (Patient taking differently: Inhale 1 puff into the lungs 2 (two) times daily. ) 36 Inhaler 3  . HYDROcodone-acetaminophen (NORCO/VICODIN) 5-325 MG tablet Take 1 tablet by mouth every 6 (six) hours as needed for severe pain. 6 tablet 0  . levothyroxine (SYNTHROID) 50 MCG tablet TAKE 1 TABLET BY MOUTH EVERY DAY (Patient taking differently: Take 50 mcg by mouth every Monday, Tuesday, Wednesday, Thursday,  and Friday. ) 90 tablet 1  . levothyroxine (SYNTHROID) 75 MCG tablet Take by mouth 2 days per week (Patient taking differently: Take 75 mcg by mouth 2 (two) times a week. Take on Saturday and Sunday) 90 tablet 1  . PROAIR HFA 108 (90 Base) MCG/ACT inhaler INHALE 2 PUFFS INTO THE LUNGS EVERY 4 HOURS AS NEEDED FOR WHEEZING OR SHORTNESS OF BREATH (Patient taking differently: Inhale 2 puffs into the lungs every 4 (four) hours as needed for wheezing or shortness of breath. ) 8.5 g 2  . Spacer/Aero-Holding Chambers (E-Z SPACER) inhaler Use as instructed 1 each 2  . Vitamin D, Ergocalciferol, (DRISDOL) 1.25 MG (50000 UNIT) CAPS capsule Take 1 capsule (50,000 Units total) by mouth every 7 (seven) days for 12 doses. 12 capsule 0  . zinc sulfate 220 (50 Zn) MG capsule Take 220 mg by mouth daily.     No current facility-administered medications for this visit.   Allergies  Allergen Reactions  . [place] Nut (Berthollefia [place]) Skin Test Anaphylaxis  . Fluconazole Shortness Of Breath  . Metronidazole Shortness Of Breath and Nausea And Vomiting  . Shellfish Allergy  Anaphylaxis  . Macrobid [Nitrofurantoin]     REACTION: Syncope  . Pantoprazole Other (See Comments)    CHEST PAIN  . Pepcid [Famotidine]   . Protonix [Pantoprazole Sodium]      Review of Systems: Positive for weakness. All other systems reviewed and negative except where noted in HPI.   Creatinine clearance cannot be calculated (Unknown ideal weight.)   Physical Exam:    Wt Readings from Last 3 Encounters:  04/30/19 144 lb 4.8 oz (65.5 kg)  11/15/18 137 lb (62.1 kg)  01/03/18 140 lb 4.8 oz (63.6 kg)    BP 126/72 (BP Location: Left Arm, Patient Position: Sitting, Cuff Size: Normal)   Pulse 80   Temp 98.2 F (36.8 C)   Ht 5' 2.5" (1.588 m) Comment: height measured without shoes  Wt 145 lb 2 oz (65.8 kg)   BMI 26.12 kg/m  Constitutional:  Pleasant female in no acute distress. Psychiatric: Normal mood and affect. Behavior is normal. EENT: Pupils normal.  Conjunctivae are normal. No scleral icterus. Neck supple.  Cardiovascular: Normal rate, regular rhythm. No edema Pulmonary/chest: Effort normal and breath sounds normal. No wheezing, rales or rhonchi. Abdominal: Soft, nondistended, mild-moderate LLQ / mid lower abdominal tenderness. . Bowel sounds active throughout. There are no masses palpable. No hepatomegaly. Rectal: No external lesions seen.  On anoscopy there were inflamed internal hemorrhoid Neurological: Alert and oriented to person place and time. Skin: Skin is warm and dry. No rashes noted.  [name], NP  06/18/2019, 1:35 PM

## 2019-06-18 NOTE — Patient Instructions (Addendum)
If you are age 83 or older, your body mass index should be between 23-30. Your Body mass index is 26.12 kg/m. If this is out of the aforementioned range listed, please consider follow up with your Primary Care Provider.  If you are age 31 or younger, your body mass index should be between 19-25. Your Body mass index is 26.12 kg/m. If this is out of the aformentioned range listed, please consider follow up with your Primary Care Provider.   START Anusol cream 1 application rectally (this includes on the inside) every night for 10 days.  RESTART Augmentin 1 tablet twice a day.  Do a Soft diet.  You have been scheduled to follow up with [name], NP on Jul 03, 2019 at 1:30 pm   Soft-Food Eating Plan A soft-food eating plan includes foods that are safe and easy to chew and swallow. Your health care provider or dietitian can help you find foods and flavors that fit into this plan. Follow this plan until your health care provider or dietitian says it is safe to start eating other foods and food textures. What are tips for following this plan? General guidelines   Take small bites of food, or cut food into pieces about  inch or smaller. Bite-sized pieces of food are easier to chew and swallow.  Eat moist foods. Avoid overly dry foods.  Avoid foods that: ? Are difficult to swallow, such as dry, chunky, crispy, or sticky foods. ? Are difficult to chew, such as hard, tough, or stringy foods. ? Contain nuts, seeds, or fruits.  Follow instructions from your dietitian about the types of liquids that are safe for you to swallow. You may be allowed to have: ? Thick liquids only. This includes only liquids that are thicker than honey. ? Thin and thick liquids. This includes all beverages and foods that become liquid at room temperature.  To make thick liquids: ? Purchase a commercial liquid thickening powder. These are available at grocery stores and pharmacies. ? Mix the thickener into  liquids according to instructions on the label. ? Purchase ready-made thickened liquids. ? Thicken soup by pureeing, straining to remove chunks, and adding flour, potato flakes, or corn starch. ? Add commercial thickener to foods that become liquid at room temperature, such as milk shakes, yogurt, ice cream, gelatin, and sherbet.  Ask your health care provider whether you need to take a fiber supplement. Cooking  Cook meats so they stay tender and moist. Use methods like braising, stewing, or baking in liquid.  Cook vegetables and fruit until they are soft enough to be mashed with a fork.  Peel soft, fresh fruits such as peaches, nectarines, and melons.  When making soup, make sure chunks of meat and vegetables are smaller than  inch.  Reheat leftover foods slowly so that a tough crust does not form. What foods are allowed? The items listed below may not be a complete list. Talk with your dietitian about what dietary choices are best for you. Grains Breads, muffins, pancakes, or waffles moistened with syrup, jelly, or butter. Dry cereals well-moistened with milk. Moist, cooked cereals. Well-cooked pasta and rice. Vegetables All soft-cooked vegetables. Shredded lettuce. Fruits All canned and cooked fruits. Soft, peeled fresh fruits. Strawberries. Dairy Milk. Cream. Yogurt. Cottage cheese. Soft cheese without the rind. Meats and other protein foods Tender, moist ground meat, poultry, or fish. Meat cooked in gravy or sauces. Eggs. Sweets and desserts Ice cream. Milk shakes. Sherbet. Pudding. Fats and oils Butter.  Margarine. Olive, canola, sunflower, and grapeseed oil. Smooth salad dressing. Smooth cream cheese. Mayonnaise. Gravy. What foods are not allowed? The items listed bemay not be a complete list. Talk with your dietitian about what dietary choices are best for you. Grains Coarse or dry cereals, such as bran, granola, and shredded wheat. Tough or chewy crusty breads, such as  [place] bread or baguettes. Breads with nuts, seeds, or fruit. Vegetables All raw vegetables. Cooked corn. Cooked vegetables that are tough or stringy. Tough, crisp, fried potatoes and potato skins. Fruits Fresh fruits with skins or seeds, or both, such as apples, pears, and grapes. Stringy, high-pulp fruits, such as papaya, pineapple, coconut, and mango. Fruit leather and all dried fruit. Dairy Yogurt with nuts or coconut. Meats and other protein foods Hard, dry sausages. Dry meat, poultry, or fish. Meats with gristle. Fish with bones. Fried meat or fish. Lunch meat and hotdogs. Nuts and seeds. Chunky peanut butter or other nut butters. Sweets and desserts Cakes or cookies that are very dry or chewy. Desserts with dried fruit, nuts, or coconut. Fried pastries. Very rich pastries. Fats and oils Cream cheese with fruit or nuts. Salad dressings with seeds or chunks. Summary  A soft-food eating plan includes foods that are safe and easy to swallow. Generally, the foods should be soft enough to be mashed with a fork.  Avoid foods that are dry, hard to chew, crunchy, sticky, stringy, or crispy.  Ask your health care provider whether you need to thicken your liquids and if you need to take a fiber supplement. This information is not intended to replace advice given to you by your health care provider. Make sure you discuss any questions you have with your health care provider. Document Revised: 05/17/2018 Document Reviewed: 03/29/2016 Elsevier Patient Education  [place].

## 2019-06-28 NOTE — Progress Notes (Signed)
Reviewed and agree with documentation and assessment and plan. K. Veena Phung Kotas , MD   

## 2019-07-03 ENCOUNTER — Ambulatory Visit (INDEPENDENT_AMBULATORY_CARE_PROVIDER_SITE_OTHER): Payer: Medicare Other | Admitting: Nurse Practitioner

## 2019-07-03 ENCOUNTER — Other Ambulatory Visit (INDEPENDENT_AMBULATORY_CARE_PROVIDER_SITE_OTHER): Payer: Medicare Other

## 2019-07-03 ENCOUNTER — Encounter: Payer: Self-pay | Admitting: Nurse Practitioner

## 2019-07-03 VITALS — BP 144/70 | HR 80 | Ht 62.5 in | Wt 144.0 lb

## 2019-07-03 DIAGNOSIS — K5792 Diverticulitis of intestine, part unspecified, without perforation or abscess without bleeding: Secondary | ICD-10-CM

## 2019-07-03 DIAGNOSIS — K625 Hemorrhage of anus and rectum: Secondary | ICD-10-CM | POA: Diagnosis not present

## 2019-07-03 DIAGNOSIS — K449 Diaphragmatic hernia without obstruction or gangrene: Secondary | ICD-10-CM

## 2019-07-03 DIAGNOSIS — K648 Other hemorrhoids: Secondary | ICD-10-CM

## 2019-07-03 LAB — BASIC METABOLIC PANEL
BUN: 19 mg/dL (ref 6–23)
CO2: 28 mEq/L (ref 19–32)
Calcium: 10.1 mg/dL (ref 8.4–10.5)
Chloride: 97 mEq/L (ref 96–112)
Creatinine, Ser: 0.71 mg/dL (ref 0.40–1.20)
GFR: 78.58 mL/min (ref >60.00)
Glucose, Bld: 99 mg/dL (ref 70–99)
Potassium: 4.3 mEq/L (ref 3.5–5.1)
Sodium: 132 mEq/L — ABNORMAL LOW (ref 135–145)

## 2019-07-03 NOTE — Patient Instructions (Addendum)
Your provider has requested that you go to the basement level for lab work before leaving today. Press "B" on the elevator. The lab is located at the first door on the left as you exit the elevator.   You have been scheduled for a CT scan of the abdomen and pelvis at [place] are scheduled on 07/25/2019 at 3:00pm. You should arrive 15 minutes prior to your appointment time for registration. Please follow the written instructions below on the day of your exam:  WARNING: IF YOU ARE ALLERGIC TO IODINE/X-RAY DYE, PLEASE NOTIFY RADIOLOGY IMMEDIATELY AT [phone]! YOU WILL BE GIVEN A 13 HOUR PREMEDICATION PREP.  1) Do not eat or drink anything after 11:00AM (4 hours prior to your test) 2) You have been given 2 bottles of oral contrast to drink. The solution may taste better if refrigerated, but do NOT add ice or any other liquid to this solution. Shake well before drinking.    Drink 1 bottle of contrast @ 1:00pm (2 hours prior to your exam)  Drink 1 bottle of contrast @ 2:00pm (1 hour prior to your exam)   The purpose of you drinking the oral contrast is to aid in the visualization of your intestinal tract. The contrast solution may cause some diarrhea. Depending on your individual set of symptoms, you may also receive an intravenous injection of x-ray contrast/dye. Plan on being at [facility] for 30 minutes or longer, depending on the type of exam you are having performed.  This test typically takes 30-45 minutes to complete.  If you have any questions regarding your exam or if you need to reschedule, you may call the CT department at [phone] between the hours of 8:00 am and 5:00 pm, Monday-Friday.  ________________________________________________________________________  I appreciate the opportunity to care for you. [name], NP-C

## 2019-07-03 NOTE — Progress Notes (Signed)
ASSESSMENT / PLAN:   85 year old female with PMH significant for remote gastritis, hiatal hernia, remote esophageal stricture, diverticulosis/diverticulitis,  hypertension, hypothyroidism, and  osteoporosis, asthma, small bowel resection for ischemia, hysterectomy, appendectomy and cholecystectomy     # Rectal bleeding / internal hemorrhoids  --Presumably bleeding was secondary to internal hemorrhoids (aggravated by loose stool) seen on recent anoscopy.  Polyps or other colon lesion not excluded but the bleeding has resolved after course of Anusol --Loose stool has resolved. Bowel movements back to normal.  --Patient will call for recurrent bleeding  # Uncomplicated acute diverticulitis --In ED on 06/03/19. CT scan compatible with acute diverticulitis with long segment thickening and mucosal edema of the rectosigmoid colon which contains numerous diverticula. Mild pericolonic fat stranding. No free fluid, abscess, or evidence ofperforation. Bowel anastomosis is again noted within the anterior pelvis ( hx of small bowel resection for ischemia).   --She feel better after extension of antibiotics.   --Doesn't want a colonoscopy. Asks about alternatives to rule out mass. We discussed repeat CT scan to make sure colon findings resolved and to evaluate for any gross colonic lesions. She prefers this option.  We will schedule follow-up CT scan to be done in approximately 3 weeks --Patient will call in the interim if she has recurrent abdominal pain  # Hiatal hernia / ? Thickening of herniated stomach on CT scan.  --Asymptomatic. No upper abdominal pain, N/V nor GERD symptoms.  --She is not interested in EGD.   # Pulmonary nodule - 4 mm juxtaplerual nodule within lateral aspect of RLL. No follow up if patient low risk.  --Will defer any necessary work-up to PCP.  HPI:     Chief Complaint: Diverticulitis follow-up   [name] was seen in the office 06/18/2019 after having been  seen in the ED late April with diverticulitis and rectal bleeding. Antibiotics extended, hemorrhoids treated and she is here for follow up.   06/02/19 ED visit for evaluation of lower abdominal cramping and bright red blood per rectum.  Labs were reassuring.CT scan w/ contrast compatible with acute diverticulitis with long segment thickening and mucosal edema of the rectosigmoid colon which contains numerous diverticula. Mild pericolonic fat stranding. No free fluid, abscess, or evidence of perforation.  Hemoglobin was 11 on arrival and 11.2 prior to discharge from the ED. No rectal bleeding in the 17 hours that she was in the ED. Rectal exam was grossly negative.  Discharged home on amoxicillin and hydrocodone  06/18/19 Office visit - Still with mild lower abdominal discomfort / tenderness.  I extended antibiotics a few more days. Regarding the rectal bleeding, it may have been hemorrhoidal bleeding resulting from loose stool.  Internal hemorrhoids were found on anoscopy.  She was prescribed a course of Anusol cream.  Patient is here for follow-up  HISTORY SINCE LAST VISIT:   Lower abdominal pain has resolved. Loose stool has resolved and no further bleeding after using the Anusol. She complain of feeling weak and having green nasal drainage.   Previous GI Evaluations:  08/15/13 colonoscopy Complete exam, bowel prep deemed good ENDOSCOPIC IMPRESSION: 1. abnormal mucosa in the rectal vault-rule out adenomatous changes versus proctitis - a source for Hemoccult-positive stool 2. Diverticulosis  Surgical [P], rectum, polyp - BENIGN LYMPHOID POLYPS. NO ADENOMATOUS CHANGE OR MALIGNANCY.  Past Medical History:  Diagnosis Date  . ABDOMINAL PAIN, CHRONIC 05/28/2008  . ALLERGIC RHINITIS 09/21/2006  . ANEMIA-NOS 09/21/2006  .  ASTHMA 09/21/2006  . ASYMPTOMATIC POSTMENOPAUSAL STATUS 05/28/2008  . Cataract   . CHEST PAIN, ATYPICAL 05/28/2008  . COPD (chronic obstructive pulmonary disease) ([place])   . DDD  (degenerative disc disease)   . DIVERTICULITIS, HX OF 09/21/2006  . GERD 09/21/2006  . HYPERCHOLESTEROLEMIA 06/05/2009  . HYPERTENSION 09/21/2006  . HYPOTHYROIDISM 09/21/2006  . Peptic ulcer disease   . Purpura ([place])   . RENAL INSUFFICIENCY 09/21/2006  . Shingles   . Small bowel obstruction ([place])   . Somatization disorder 04/25/2007  . UNSPECIFIED URINARY CALCULUS 05/28/2008     Past Surgical History:  Procedure Laterality Date  . ABDOMINAL HYSTERECTOMY  1981  . acute Nephritis  1958  . APPENDECTOMY  1959  . Asthma & Pneumonia Birth  1970's  . CESAREAN SECTION    . CHOLECYSTECTOMY  1983  . COLON SURGERY    . DENTAL SURGERY  1970  . NASAL SINUS SURGERY  1967  . Pulmonary thrombosis    . TONSILLECTOMY    . TUBAL LIGATION     Family History  Problem Relation Age of Onset  . Polycystic kidney disease Mother   . Pancreatic cancer Mother   . Other Father        Schamberg disease  . Bladder Cancer Father   . Hypertension Father   . Marfan syndrome Son   . Hemochromatosis Son   . Cirrhosis Son   . Allergic rhinitis Sister   . Other Brother        bone issue as child; multiple fractures but seemed to age out of this  . Hemochromatosis Cousin   . Arthritis Sister    Social History   Tobacco Use  . Smoking status: Never Smoker  . Smokeless tobacco: Never Used  Substance Use Topics  . Alcohol use: No  . Drug use: No   Current Outpatient Medications  Medication Sig Dispense Refill  . acetaminophen (TYLENOL) 325 MG tablet Take 650-975 mg by mouth every 6 (six) hours as needed for mild pain or headache.    . Ferrous Sulfate (IRON) 325 (65 FE) MG TABS Take 1 tablet by mouth daily.     . fluticasone (FLOVENT HFA) 110 MCG/ACT inhaler TAKE 1 PUFF BY MOUTH TWICE A DAY (Patient taking differently: Inhale 1 puff into the lungs 2 (two) times daily. ) 36 Inhaler 3  . levothyroxine (SYNTHROID) 50 MCG tablet TAKE 1 TABLET BY MOUTH EVERY DAY (Patient taking differently: Take 50 mcg by  mouth every Monday, Tuesday, Wednesday, Thursday, and Friday. ) 90 tablet 1  . levothyroxine (SYNTHROID) 75 MCG tablet Take by mouth 2 days per week (Patient taking differently: Take 75 mcg by mouth 2 (two) times a week. Take on Saturday and Sunday) 90 tablet 1  . PROAIR HFA 108 (90 Base) MCG/ACT inhaler INHALE 2 PUFFS INTO THE LUNGS EVERY 4 HOURS AS NEEDED FOR WHEEZING OR SHORTNESS OF BREATH (Patient taking differently: Inhale 2 puffs into the lungs every 4 (four) hours as needed for wheezing or shortness of breath. ) 8.5 g 2  . Spacer/Aero-Holding Chambers (E-Z SPACER) inhaler Use as instructed 1 each 2  . Vitamin D, Ergocalciferol, (DRISDOL) 1.25 MG (50000 UNIT) CAPS capsule Take 1 capsule (50,000 Units total) by mouth every 7 (seven) days for 12 doses. 12 capsule 0  . zinc sulfate 220 (50 Zn) MG capsule Take 220 mg by mouth once a week.      No current facility-administered medications for this visit.   Allergies  Allergen Reactions  .  [place] Nut (Berthol[place]fia [place]) Skin Test Anaphylaxis  . Fluconazole Shortness Of Breath  . Metronidazole Shortness Of Breath and Nausea And Vomiting  . Shellfish Allergy Anaphylaxis  . Macrobid [Nitrofurantoin]     REACTION: Syncope  . Pantoprazole Other (See Comments)    CHEST PAIN  . Pepcid [Famotidine]   . Protonix [Pantoprazole Sodium]    Creatinine clearance cannot be calculated (Patient's most recent lab result is older than the maximum 21 days allowed.)   Physical Exam:    Wt Readings from Last 3 Encounters:  06/18/19 145 lb 2 oz (65.8 kg)  04/30/19 144 lb 4.8 oz (65.5 kg)  11/15/18 137 lb (62.1 kg)    BP (!) 144/70   Pulse 80   Ht 5' 2.5" (1.588 m)   Wt 144 lb (65.3 kg)   BMI 25.92 kg/m  Constitutional:  Pleasant female in no acute distress. Psychiatric: Normal mood and affect. Behavior is normal. EENT: Pupils normal.  Conjunctivae are normal. No scleral icterus. Neck supple.  Cardiovascular: Normal rate, regular rhythm. No  edema Pulmonary/chest: Effort normal and breath sounds normal. No wheezing, rales or rhonchi. Abdominal: Soft, nondistended, nontender. Bowel sounds active throughout. There are no masses palpable. No hepatomegaly. Neurological: Alert and oriented to person place and time. Skin: Skin is warm and dry. No rashes noted.  [name], NP  07/03/2019, 1:21 PM  Cc:  Referring Provider Caren Macadam, MD

## 2019-07-04 ENCOUNTER — Encounter: Payer: Self-pay | Admitting: Nurse Practitioner

## 2019-07-09 DIAGNOSIS — N390 Urinary tract infection, site not specified: Secondary | ICD-10-CM

## 2019-07-09 HISTORY — DX: Urinary tract infection, site not specified: N39.0

## 2019-07-11 NOTE — Progress Notes (Signed)
Reviewed and agree with documentation and assessment and plan. K. Veena Gibbs Naugle , MD   

## 2019-07-12 ENCOUNTER — Other Ambulatory Visit: Payer: Self-pay | Admitting: Family Medicine

## 2019-07-17 ENCOUNTER — Other Ambulatory Visit: Payer: Self-pay | Admitting: Internal Medicine

## 2019-07-17 DIAGNOSIS — E559 Vitamin D deficiency, unspecified: Secondary | ICD-10-CM

## 2019-07-25 ENCOUNTER — Other Ambulatory Visit: Payer: Self-pay

## 2019-07-25 ENCOUNTER — Ambulatory Visit (HOSPITAL_COMMUNITY)
Admission: RE | Admit: 2019-07-25 | Discharge: 2019-07-25 | Disposition: A | Discharge status: Home / Self Care | Payer: Medicare Other | Source: Ambulatory Visit | Attending: Nurse Practitioner | Admitting: Nurse Practitioner

## 2019-07-25 DIAGNOSIS — K922 Gastrointestinal hemorrhage, unspecified: Secondary | ICD-10-CM | POA: Diagnosis not present

## 2019-07-25 DIAGNOSIS — K5792 Diverticulitis of intestine, part unspecified, without perforation or abscess without bleeding: Secondary | ICD-10-CM | POA: Insufficient documentation

## 2019-07-25 MED ORDER — IOHEXOL 300 MG/ML  SOLN
100.0000 mL | Freq: Once | INTRAMUSCULAR | Status: AC | PRN
  Administered 2019-07-25: 100 mL via INTRAVENOUS

## 2019-07-30 ENCOUNTER — Other Ambulatory Visit: Payer: Self-pay

## 2019-07-30 ENCOUNTER — Telehealth: Payer: Self-pay | Admitting: Nurse Practitioner

## 2019-07-30 NOTE — Telephone Encounter (Signed)
Spoke with patient, see result note for more information.

## 2019-07-31 ENCOUNTER — Other Ambulatory Visit (INDEPENDENT_AMBULATORY_CARE_PROVIDER_SITE_OTHER): Payer: Medicare Other

## 2019-07-31 DIAGNOSIS — E871 Hypo-osmolality and hyponatremia: Secondary | ICD-10-CM | POA: Diagnosis not present

## 2019-07-31 DIAGNOSIS — E559 Vitamin D deficiency, unspecified: Secondary | ICD-10-CM

## 2019-07-31 LAB — BASIC METABOLIC PANEL
BUN: 14 mg/dL (ref 6–23)
CO2: 26 mEq/L (ref 19–32)
Calcium: 10 mg/dL (ref 8.4–10.5)
Chloride: 98 mEq/L (ref 96–112)
Creatinine, Ser: 0.61 mg/dL (ref 0.40–1.20)
GFR: 93.6 mL/min (ref >60.00)
Glucose, Bld: 120 mg/dL — ABNORMAL HIGH (ref 70–99)
Potassium: 4.3 mEq/L (ref 3.5–5.1)
Sodium: 132 mEq/L — ABNORMAL LOW (ref 135–145)

## 2019-07-31 LAB — VITAMIN D 25 HYDROXY (VIT D DEFICIENCY, FRACTURES): VITD: 50.44 ng/mL (ref 30.00–100.00)

## 2019-08-01 ENCOUNTER — Telehealth: Payer: Self-pay | Admitting: Family Medicine

## 2019-08-01 NOTE — Telephone Encounter (Signed)
Pt would like to know why she was prescribed Flovent? Pt feels that this is not working for her. Pt would like to speak to Dr. Ethlyn Gallery. Thanks

## 2019-08-02 NOTE — Telephone Encounter (Signed)
Flovent is to help with asthma control. It has been nearly a year since we have seen her in the office. If she feels that breathing is not doing well, it would be best addressed with in office visit/exam. Make sure not urgent. Thanks!

## 2019-08-02 NOTE — Telephone Encounter (Signed)
No answer at the pts home number x2.

## 2019-08-05 ENCOUNTER — Telehealth: Payer: Self-pay | Admitting: Family Medicine

## 2019-08-05 ENCOUNTER — Encounter (HOSPITAL_COMMUNITY): Payer: Self-pay | Admitting: Emergency Medicine

## 2019-08-05 ENCOUNTER — Inpatient Hospital Stay (HOSPITAL_COMMUNITY)
Admission: EM | Admit: 2019-08-05 | Discharge: 2019-08-09 | DRG: 690 | Disposition: A | Discharge status: Home / Self Care | Payer: Medicare Other | Attending: Internal Medicine | Admitting: Internal Medicine

## 2019-08-05 DIAGNOSIS — E7800 Pure hypercholesterolemia, unspecified: Secondary | ICD-10-CM | POA: Diagnosis not present

## 2019-08-05 DIAGNOSIS — E871 Hypo-osmolality and hyponatremia: Secondary | ICD-10-CM | POA: Diagnosis present

## 2019-08-05 DIAGNOSIS — E559 Vitamin D deficiency, unspecified: Secondary | ICD-10-CM | POA: Diagnosis present

## 2019-08-05 DIAGNOSIS — J449 Chronic obstructive pulmonary disease, unspecified: Secondary | ICD-10-CM | POA: Diagnosis not present

## 2019-08-05 DIAGNOSIS — R310 Gross hematuria: Secondary | ICD-10-CM | POA: Diagnosis not present

## 2019-08-05 DIAGNOSIS — R6889 Other general symptoms and signs: Secondary | ICD-10-CM | POA: Diagnosis not present

## 2019-08-05 DIAGNOSIS — N3000 Acute cystitis without hematuria: Secondary | ICD-10-CM | POA: Diagnosis not present

## 2019-08-05 DIAGNOSIS — R109 Unspecified abdominal pain: Secondary | ICD-10-CM | POA: Diagnosis not present

## 2019-08-05 DIAGNOSIS — N390 Urinary tract infection, site not specified: Principal | ICD-10-CM | POA: Diagnosis present

## 2019-08-05 DIAGNOSIS — M25551 Pain in right hip: Secondary | ICD-10-CM | POA: Diagnosis present

## 2019-08-05 DIAGNOSIS — E039 Hypothyroidism, unspecified: Secondary | ICD-10-CM | POA: Diagnosis not present

## 2019-08-05 DIAGNOSIS — Z03818 Encounter for observation for suspected exposure to other biological agents ruled out: Secondary | ICD-10-CM | POA: Diagnosis not present

## 2019-08-05 DIAGNOSIS — Z800 Family history of malignant neoplasm of digestive organs: Secondary | ICD-10-CM | POA: Diagnosis not present

## 2019-08-05 DIAGNOSIS — Z20822 Contact with and (suspected) exposure to covid-19: Secondary | ICD-10-CM | POA: Diagnosis present

## 2019-08-05 DIAGNOSIS — N12 Tubulo-interstitial nephritis, not specified as acute or chronic: Secondary | ICD-10-CM

## 2019-08-05 DIAGNOSIS — R519 Headache, unspecified: Secondary | ICD-10-CM | POA: Diagnosis not present

## 2019-08-05 DIAGNOSIS — Z86711 Personal history of pulmonary embolism: Secondary | ICD-10-CM

## 2019-08-05 DIAGNOSIS — Z8249 Family history of ischemic heart disease and other diseases of the circulatory system: Secondary | ICD-10-CM | POA: Diagnosis not present

## 2019-08-05 DIAGNOSIS — Z87442 Personal history of urinary calculi: Secondary | ICD-10-CM | POA: Diagnosis not present

## 2019-08-05 DIAGNOSIS — Z8261 Family history of arthritis: Secondary | ICD-10-CM

## 2019-08-05 DIAGNOSIS — Z8271 Family history of polycystic kidney: Secondary | ICD-10-CM

## 2019-08-05 DIAGNOSIS — Z888 Allergy status to other drugs, medicaments and biological substances status: Secondary | ICD-10-CM | POA: Diagnosis not present

## 2019-08-05 DIAGNOSIS — N200 Calculus of kidney: Secondary | ICD-10-CM | POA: Diagnosis not present

## 2019-08-05 DIAGNOSIS — Z79899 Other long term (current) drug therapy: Secondary | ICD-10-CM | POA: Diagnosis not present

## 2019-08-05 DIAGNOSIS — I700 Atherosclerosis of aorta: Secondary | ICD-10-CM | POA: Diagnosis not present

## 2019-08-05 DIAGNOSIS — D509 Iron deficiency anemia, unspecified: Secondary | ICD-10-CM | POA: Diagnosis present

## 2019-08-05 DIAGNOSIS — Z90710 Acquired absence of both cervix and uterus: Secondary | ICD-10-CM

## 2019-08-05 DIAGNOSIS — Z9049 Acquired absence of other specified parts of digestive tract: Secondary | ICD-10-CM

## 2019-08-05 DIAGNOSIS — B9689 Other specified bacterial agents as the cause of diseases classified elsewhere: Secondary | ICD-10-CM | POA: Diagnosis present

## 2019-08-05 DIAGNOSIS — Z8719 Personal history of other diseases of the digestive system: Secondary | ICD-10-CM | POA: Diagnosis not present

## 2019-08-05 DIAGNOSIS — Z8052 Family history of malignant neoplasm of bladder: Secondary | ICD-10-CM | POA: Diagnosis not present

## 2019-08-05 DIAGNOSIS — K5190 Ulcerative colitis, unspecified, without complications: Secondary | ICD-10-CM | POA: Diagnosis present

## 2019-08-05 DIAGNOSIS — Z743 Need for continuous supervision: Secondary | ICD-10-CM | POA: Diagnosis not present

## 2019-08-05 DIAGNOSIS — Z8711 Personal history of peptic ulcer disease: Secondary | ICD-10-CM

## 2019-08-05 DIAGNOSIS — K529 Noninfective gastroenteritis and colitis, unspecified: Secondary | ICD-10-CM | POA: Diagnosis present

## 2019-08-05 DIAGNOSIS — K219 Gastro-esophageal reflux disease without esophagitis: Secondary | ICD-10-CM | POA: Diagnosis not present

## 2019-08-05 DIAGNOSIS — K449 Diaphragmatic hernia without obstruction or gangrene: Secondary | ICD-10-CM | POA: Diagnosis not present

## 2019-08-05 DIAGNOSIS — Z8279 Family history of other congenital malformations, deformations and chromosomal abnormalities: Secondary | ICD-10-CM

## 2019-08-05 DIAGNOSIS — Z881 Allergy status to other antibiotic agents status: Secondary | ICD-10-CM | POA: Diagnosis not present

## 2019-08-05 DIAGNOSIS — Z79890 Hormone replacement therapy (postmenopausal): Secondary | ICD-10-CM

## 2019-08-05 DIAGNOSIS — M25559 Pain in unspecified hip: Secondary | ICD-10-CM

## 2019-08-05 DIAGNOSIS — Z7951 Long term (current) use of inhaled steroids: Secondary | ICD-10-CM

## 2019-08-05 DIAGNOSIS — K6389 Other specified diseases of intestine: Secondary | ICD-10-CM | POA: Diagnosis not present

## 2019-08-05 LAB — BASIC METABOLIC PANEL
Anion gap: 8 (ref 5–15)
BUN: 15 mg/dL (ref 8–23)
CO2: 27 mmol/L (ref 22–32)
Calcium: 9.9 mg/dL (ref 8.9–10.3)
Chloride: 97 mmol/L — ABNORMAL LOW (ref 98–111)
Creatinine, Ser: 0.62 mg/dL (ref 0.44–1.00)
GFR calc Af Amer: >60 mL/min (ref >60)
GFR calc non Af Amer: >60 mL/min (ref >60)
Glucose, Bld: 104 mg/dL — ABNORMAL HIGH (ref 70–99)
Potassium: 4.5 mmol/L (ref 3.5–5.1)
Sodium: 132 mmol/L — ABNORMAL LOW (ref 135–145)

## 2019-08-05 LAB — CBC
HCT: 33.7 % — ABNORMAL LOW (ref 36.0–46.0)
Hemoglobin: 10.8 g/dL — ABNORMAL LOW (ref 12.0–15.0)
MCH: 30.8 pg (ref 26.0–34.0)
MCHC: 32 g/dL (ref 30.0–36.0)
MCV: 96 fL (ref 80.0–100.0)
Platelets: 350 10*3/uL (ref 150–400)
RBC: 3.51 MIL/uL — ABNORMAL LOW (ref 3.87–5.11)
RDW: 13.1 % (ref 11.5–15.5)
WBC: 7.8 10*3/uL (ref 4.0–10.5)
nRBC: 0 % (ref 0.0–0.2)

## 2019-08-05 NOTE — Telephone Encounter (Signed)
Pt was transferred to a Nurse Triage. Pt has blood in urine and is in a lot of pain.

## 2019-08-05 NOTE — ED Triage Notes (Signed)
Arrives via [facility] from home with c/o painful urination, R flank and R groin pain for the past day. Called pcp who advised her to come here for further eval. Hx of renal calculi in the past.

## 2019-08-06 ENCOUNTER — Encounter (HOSPITAL_COMMUNITY): Payer: Self-pay | Admitting: Family Medicine

## 2019-08-06 ENCOUNTER — Emergency Department (HOSPITAL_COMMUNITY): Payer: Medicare Other

## 2019-08-06 ENCOUNTER — Observation Stay (HOSPITAL_COMMUNITY): Payer: Medicare Other

## 2019-08-06 DIAGNOSIS — E871 Hypo-osmolality and hyponatremia: Secondary | ICD-10-CM | POA: Diagnosis not present

## 2019-08-06 DIAGNOSIS — K6389 Other specified diseases of intestine: Secondary | ICD-10-CM | POA: Diagnosis not present

## 2019-08-06 DIAGNOSIS — N10 Acute tubulo-interstitial nephritis: Secondary | ICD-10-CM | POA: Diagnosis not present

## 2019-08-06 DIAGNOSIS — N390 Urinary tract infection, site not specified: Secondary | ICD-10-CM | POA: Diagnosis present

## 2019-08-06 DIAGNOSIS — D509 Iron deficiency anemia, unspecified: Secondary | ICD-10-CM | POA: Diagnosis not present

## 2019-08-06 DIAGNOSIS — Z79899 Other long term (current) drug therapy: Secondary | ICD-10-CM | POA: Diagnosis not present

## 2019-08-06 DIAGNOSIS — R310 Gross hematuria: Secondary | ICD-10-CM | POA: Diagnosis present

## 2019-08-06 DIAGNOSIS — K529 Noninfective gastroenteritis and colitis, unspecified: Secondary | ICD-10-CM | POA: Diagnosis not present

## 2019-08-06 DIAGNOSIS — K449 Diaphragmatic hernia without obstruction or gangrene: Secondary | ICD-10-CM | POA: Diagnosis not present

## 2019-08-06 DIAGNOSIS — M25551 Pain in right hip: Secondary | ICD-10-CM | POA: Diagnosis present

## 2019-08-06 DIAGNOSIS — M1612 Unilateral primary osteoarthritis, left hip: Secondary | ICD-10-CM | POA: Diagnosis not present

## 2019-08-06 DIAGNOSIS — Z8271 Family history of polycystic kidney: Secondary | ICD-10-CM | POA: Diagnosis not present

## 2019-08-06 DIAGNOSIS — J449 Chronic obstructive pulmonary disease, unspecified: Secondary | ICD-10-CM

## 2019-08-06 DIAGNOSIS — M8588 Other specified disorders of bone density and structure, other site: Secondary | ICD-10-CM | POA: Diagnosis not present

## 2019-08-06 DIAGNOSIS — M47816 Spondylosis without myelopathy or radiculopathy, lumbar region: Secondary | ICD-10-CM | POA: Diagnosis not present

## 2019-08-06 DIAGNOSIS — Z888 Allergy status to other drugs, medicaments and biological substances status: Secondary | ICD-10-CM | POA: Diagnosis not present

## 2019-08-06 DIAGNOSIS — N3000 Acute cystitis without hematuria: Secondary | ICD-10-CM | POA: Diagnosis not present

## 2019-08-06 DIAGNOSIS — M1611 Unilateral primary osteoarthritis, right hip: Secondary | ICD-10-CM | POA: Diagnosis not present

## 2019-08-06 DIAGNOSIS — R519 Headache, unspecified: Secondary | ICD-10-CM | POA: Diagnosis not present

## 2019-08-06 DIAGNOSIS — I739 Peripheral vascular disease, unspecified: Secondary | ICD-10-CM | POA: Diagnosis not present

## 2019-08-06 DIAGNOSIS — E039 Hypothyroidism, unspecified: Secondary | ICD-10-CM | POA: Diagnosis not present

## 2019-08-06 DIAGNOSIS — K5190 Ulcerative colitis, unspecified, without complications: Secondary | ICD-10-CM | POA: Diagnosis present

## 2019-08-06 DIAGNOSIS — B9689 Other specified bacterial agents as the cause of diseases classified elsewhere: Secondary | ICD-10-CM | POA: Diagnosis present

## 2019-08-06 DIAGNOSIS — Z87442 Personal history of urinary calculi: Secondary | ICD-10-CM | POA: Diagnosis not present

## 2019-08-06 DIAGNOSIS — Z881 Allergy status to other antibiotic agents status: Secondary | ICD-10-CM | POA: Diagnosis not present

## 2019-08-06 DIAGNOSIS — K219 Gastro-esophageal reflux disease without esophagitis: Secondary | ICD-10-CM | POA: Diagnosis present

## 2019-08-06 DIAGNOSIS — E7800 Pure hypercholesterolemia, unspecified: Secondary | ICD-10-CM | POA: Diagnosis present

## 2019-08-06 DIAGNOSIS — Z8719 Personal history of other diseases of the digestive system: Secondary | ICD-10-CM | POA: Diagnosis not present

## 2019-08-06 DIAGNOSIS — Z8052 Family history of malignant neoplasm of bladder: Secondary | ICD-10-CM | POA: Diagnosis not present

## 2019-08-06 DIAGNOSIS — Z8249 Family history of ischemic heart disease and other diseases of the circulatory system: Secondary | ICD-10-CM | POA: Diagnosis not present

## 2019-08-06 DIAGNOSIS — I700 Atherosclerosis of aorta: Secondary | ICD-10-CM | POA: Diagnosis not present

## 2019-08-06 DIAGNOSIS — Z800 Family history of malignant neoplasm of digestive organs: Secondary | ICD-10-CM | POA: Diagnosis not present

## 2019-08-06 DIAGNOSIS — Z79890 Hormone replacement therapy (postmenopausal): Secondary | ICD-10-CM | POA: Diagnosis not present

## 2019-08-06 DIAGNOSIS — Z20822 Contact with and (suspected) exposure to covid-19: Secondary | ICD-10-CM | POA: Diagnosis present

## 2019-08-06 DIAGNOSIS — Q600 Renal agenesis, unilateral: Secondary | ICD-10-CM | POA: Diagnosis not present

## 2019-08-06 DIAGNOSIS — E559 Vitamin D deficiency, unspecified: Secondary | ICD-10-CM | POA: Diagnosis present

## 2019-08-06 LAB — URINALYSIS, ROUTINE W REFLEX MICROSCOPIC
Bilirubin Urine: NEGATIVE
Glucose, UA: NEGATIVE mg/dL
Ketones, ur: NEGATIVE mg/dL
Nitrite: POSITIVE — AB
Protein, ur: 100 mg/dL — AB
Specific Gravity, Urine: 1.012 (ref 1.005–1.030)
WBC, UA: >50 WBC/hpf — ABNORMAL HIGH (ref 0–5)
pH: 6 (ref 5.0–8.0)

## 2019-08-06 LAB — SARS CORONAVIRUS 2 BY RT PCR (HOSPITAL ORDER, PERFORMED IN ~~LOC~~ HOSPITAL LAB): SARS Coronavirus 2: NEGATIVE

## 2019-08-06 MED ORDER — ONDANSETRON HCL 4 MG PO TABS: 4.0000 mg | ORAL_TABLET | Freq: Four times a day (QID) | ORAL | Status: DC | PRN

## 2019-08-06 MED ORDER — SODIUM CHLORIDE 0.9 % IV SOLN
1.0000 g | INTRAVENOUS | Status: DC
  Administered 2019-08-07 – 2019-08-09 (×3): 1 g via INTRAVENOUS
  Filled 2019-08-06: qty 10
  Filled 2019-08-06: qty 0.02
  Filled 2019-08-06: qty 10
  Filled 2019-08-06: qty 0.05

## 2019-08-06 MED ORDER — LEVOTHYROXINE SODIUM 50 MCG PO TABS
50.0000 ug | ORAL_TABLET | ORAL | Status: DC
  Administered 2019-08-06 – 2019-08-09 (×4): 50 ug via ORAL
  Filled 2019-08-06 (×5): qty 1

## 2019-08-06 MED ORDER — SENNOSIDES-DOCUSATE SODIUM 8.6-50 MG PO TABS: 1.0000 | ORAL_TABLET | Freq: Every evening | ORAL | Status: DC | PRN

## 2019-08-06 MED ORDER — ALBUTEROL SULFATE (2.5 MG/3ML) 0.083% IN NEBU: 2.5000 mg | INHALATION_SOLUTION | RESPIRATORY_TRACT | Status: DC | PRN

## 2019-08-06 MED ORDER — BUDESONIDE 0.25 MG/2ML IN SUSP
0.2500 mg | Freq: Two times a day (BID) | RESPIRATORY_TRACT | Status: DC
  Administered 2019-08-06 – 2019-08-08 (×4): 0.25 mg via RESPIRATORY_TRACT
  Filled 2019-08-06 (×5): qty 2

## 2019-08-06 MED ORDER — ACETAMINOPHEN 650 MG RE SUPP: 650.0000 mg | Freq: Four times a day (QID) | RECTAL | Status: DC | PRN

## 2019-08-06 MED ORDER — SODIUM CHLORIDE 0.9 % IV SOLN: INTRAVENOUS | Status: AC

## 2019-08-06 MED ORDER — FLUTICASONE PROPIONATE HFA 110 MCG/ACT IN AERO: 1.0000 | INHALATION_SPRAY | Freq: Two times a day (BID) | RESPIRATORY_TRACT | Status: DC

## 2019-08-06 MED ORDER — ALBUTEROL SULFATE HFA 108 (90 BASE) MCG/ACT IN AERS
2.0000 | INHALATION_SPRAY | RESPIRATORY_TRACT | Status: DC | PRN
  Filled 2019-08-06: qty 6.7

## 2019-08-06 MED ORDER — SODIUM CHLORIDE 0.9 % IV SOLN
2.0000 g | Freq: Once | INTRAVENOUS | Status: AC
  Administered 2019-08-06: 2 g via INTRAVENOUS
  Filled 2019-08-06: qty 20

## 2019-08-06 MED ORDER — ENOXAPARIN SODIUM 40 MG/0.4ML ~~LOC~~ SOLN
40.0000 mg | SUBCUTANEOUS | Status: DC
  Administered 2019-08-06 – 2019-08-08 (×3): 40 mg via SUBCUTANEOUS
  Filled 2019-08-06 (×3): qty 0.4

## 2019-08-06 MED ORDER — ACETAMINOPHEN 325 MG PO TABS
650.0000 mg | ORAL_TABLET | Freq: Four times a day (QID) | ORAL | Status: DC | PRN
  Administered 2019-08-06 – 2019-08-09 (×10): 650 mg via ORAL
  Filled 2019-08-06 (×11): qty 2

## 2019-08-06 MED ORDER — ONDANSETRON HCL 4 MG/2ML IJ SOLN: 4.0000 mg | Freq: Four times a day (QID) | INTRAMUSCULAR | Status: DC | PRN

## 2019-08-06 MED ORDER — LEVOTHYROXINE SODIUM 75 MCG PO TABS
75.0000 ug | ORAL_TABLET | ORAL | Status: DC
  Administered 2019-08-08: 75 ug via ORAL
  Filled 2019-08-06: qty 1

## 2019-08-06 NOTE — ED Notes (Signed)
Transported to renal study

## 2019-08-06 NOTE — Progress Notes (Addendum)
Patient is a 85 year old female with multiple medical problems including hypothyroidism, COPD, iron deficiency anemia, recent treatment for acute diverticulitis presented to the emergency room with right flank pain, dysuria and hematuria.  Currently on treatment with Rocephin.  Urine cultures pending at this time.  Patient seen and examined.  She is complaining of the right hip pain.  CT renal stone study showed colitis involving the descending and sigmoid colon, no urinary tract calculi or obstructive uropathy.  She denies having any diarrhea at this time.  Continue current management with antibiotics.  Follow-up on the urine cultures.

## 2019-08-06 NOTE — H&P (Signed)
History and Physical    [name] UJW:[id number] DOB: 08/14/1934 DOA: 08/05/2019  PCP: Caren Macadam, MD   Patient coming from: Home   Chief Complaint: Right flank pain, pain with urination  HPI: [name] is a 85 y.o. female with medical history significant for hypothyroidism, COPD, iron deficiency anemia, and recent treatment for acute diverticulitis, now presenting to the emergency department with pain in the right flank, dysuria, and gross hematuria.  Patient reports that over the past day she has developed pain in the right flank and dysuria.  She mentioned earlier that she saw some blood in her urine.  She denies any diarrhea, melena, or hematochezia.  She has had some mild nausea but no vomiting.  She has not noticed any fevers or chills.  She reports history of kidney stones.  Symptoms are much different than when she had diverticulitis in April which involved rectal bleeding and pain on the left side.  She reports calling her PCP office for advice and was directed to the ED.  ED Course: Upon arrival to the ED, patient is found to be afebrile, saturating well on room air, and with stable blood pressure.  Urinalysis with many bacteria, large leukocytes, positive nitrates, and greater than 50 bacteria per hpf.  CT renal stone study demonstrates colitis involving the descending and sigmoid colon and no urinary tract calculi or obstructive uropathy.  Chemistry panel features a sodium of 132 and CBC notable for a stable chronic anemia.  Urine was sent for culture, 2 g of IV Rocephin were administered, and COVID-19 screening test was ordered but not yet resulted.  Review of Systems:  All other systems reviewed and apart from HPI, are negative.  Past Medical History:  Diagnosis Date  . ABDOMINAL PAIN, CHRONIC 05/28/2008  . ALLERGIC RHINITIS 09/21/2006  . ANEMIA-NOS 09/21/2006  . ASTHMA 09/21/2006  . ASYMPTOMATIC POSTMENOPAUSAL STATUS 05/28/2008  . Cataract   . CHEST PAIN, ATYPICAL  05/28/2008  . COPD (chronic obstructive pulmonary disease) ([place])   . DDD (degenerative disc disease)   . DIVERTICULITIS, HX OF 09/21/2006  . GERD 09/21/2006  . HYPERCHOLESTEROLEMIA 06/05/2009  . HYPERTENSION 09/21/2006  . HYPOTHYROIDISM 09/21/2006  . Peptic ulcer disease   . Purpura ([place])   . RENAL INSUFFICIENCY 09/21/2006  . Shingles   . Small bowel obstruction ([place])   . Somatization disorder 04/25/2007  . UNSPECIFIED URINARY CALCULUS 05/28/2008    Past Surgical History:  Procedure Laterality Date  . ABDOMINAL HYSTERECTOMY  1981  . acute Nephritis  1958  . APPENDECTOMY  1959  . CESAREAN SECTION    . CHOLECYSTECTOMY  1983  . COLON SURGERY    . DENTAL SURGERY  1970  . NASAL SINUS SURGERY  1967  . Pulmonary thrombosis    . TONSILLECTOMY    . TUBAL LIGATION       reports that she has never smoked. She has never used smokeless tobacco. She reports that she does not drink alcohol and does not use drugs.  Allergies  Allergen Reactions  . [place] Nut (Berthollefia [place]) Skin Test Anaphylaxis  . Fluconazole Shortness Of Breath  . Metronidazole Shortness Of Breath and Nausea And Vomiting  . Pepcid [Famotidine] Shortness Of Breath and Other (See Comments)    Dizziness  . Shellfish Allergy Anaphylaxis  . Macrobid [Nitrofurantoin]     REACTION: Syncope  . Pantoprazole Other (See Comments)    CHEST PAIN    Family History  Problem Relation Age of Onset  .  Polycystic kidney disease Mother   . Pancreatic cancer Mother   . Other Father        Schamberg disease  . Bladder Cancer Father   . Hypertension Father   . Marfan syndrome Son   . Hemochromatosis Son   . Cirrhosis Son   . Allergic rhinitis Sister   . Other Brother        bone issue as child; multiple fractures but seemed to age out of this  . Hemochromatosis Cousin   . Arthritis Sister      Prior to Admission medications   Medication Sig Start Date End Date Taking? Authorizing Provider  acetaminophen (TYLENOL) 325 MG  tablet Take 650-975 mg by mouth daily.    Yes [provider]  Cholecalciferol (VITAMIN D3 PO) Take 1 tablet by mouth 2 (two) times a week. Saturday and Sunday   Yes [provider]  Ferrous Sulfate (IRON) 325 (65 FE) MG TABS Take 1 tablet by mouth daily.    Yes [provider]  fluticasone (FLOVENT HFA) 110 MCG/ACT inhaler INHALE 1 PUFF BY MOUTH TWICE A DAY Patient taking differently: Inhale 1 puff into the lungs 2 (two) times daily.  07/12/19  Yes Caren Macadam, MD  levothyroxine (SYNTHROID) 50 MCG tablet TAKE 1 TABLET BY MOUTH EVERY DAY Patient taking differently: Take 50 mcg by mouth every Monday, Tuesday, Wednesday, Thursday, and Friday.  12/26/18  Yes Koberlein, Steele Berg, MD  levothyroxine (SYNTHROID) 75 MCG tablet Take by mouth 2 days per week Patient taking differently: Take 75 mcg by mouth 2 (two) times a week. Take on Saturday and Sunday 08/31/18  Yes Koberlein, Steele Berg, MD  PROAIR HFA 108 (90 Base) MCG/ACT inhaler INHALE 2 PUFFS INTO THE LUNGS EVERY 4 HOURS AS NEEDED FOR WHEEZING OR SHORTNESS OF BREATH Patient taking differently: Inhale 2 puffs into the lungs every 4 (four) hours as needed for wheezing or shortness of breath.  02/05/19  Yes Caren Macadam, MD  Spacer/Aero-Holding Chambers (E-Z SPACER) inhaler Use as instructed 11/08/17   Caren Macadam, MD    Physical Exam: Vitals:   08/05/19 1447 08/05/19 1810 08/05/19 2042  BP: (!) 148/58 135/61 (!) 141/48  Pulse: 76 74 75  Resp: 18 18 18   Temp: 98.6 F (37 C)    TempSrc: Oral    SpO2: 98% 98% 99%    Constitutional: NAD, calm  Eyes: PERTLA, lids and conjunctivae normal ENMT: Mucous membranes are moist. Posterior pharynx clear of any exudate or lesions.   Neck: normal, supple, no masses, no thyromegaly Respiratory:  no wheezing, no crackles. No accessory muscle use.  Cardiovascular: S1 & S2 heard, regular rate and rhythm. No extremity edema.   Abdomen: No distension, no tenderness,  soft. Bowel sounds active.  Musculoskeletal: no clubbing / cyanosis. No joint deformity upper and lower extremities.   Skin: no significant rashes, lesions, ulcers. Warm, dry, well-perfused. Neurologic: no gross facial asymmetry. Sensation intact. Moving all extremities.  Psychiatric: Alert and oriented to person, place, and situation. Calm and cooperative.    Labs and Imaging on Admission: I have personally reviewed following labs and imaging studies  CBC: Recent Labs  Lab 08/05/19 1456  WBC 7.8  HGB 10.8*  HCT 33.7*  MCV 96.0  PLT 509   Basic Metabolic Panel: Recent Labs  Lab 07/31/19 1418 08/05/19 1456  NA 132* 132*  K 4.3 4.5  CL 98 97*  CO2 26 27  GLUCOSE 120* 104*  BUN 14 15  CREATININE 0.61 0.62  CALCIUM 10.0 9.9   GFR: CrCl cannot be calculated (Unknown ideal weight.). Liver Function Tests: No results for input(s): AST, ALT, ALKPHOS, BILITOT, PROT, ALBUMIN in the last 168 hours. No results for input(s): LIPASE, AMYLASE in the last 168 hours. No results for input(s): AMMONIA in the last 168 hours. Coagulation Profile: No results for input(s): INR, PROTIME in the last 168 hours. Cardiac Enzymes: No results for input(s): CKTOTAL, CKMB, CKMBINDEX, TROPONINI in the last 168 hours. BNP (last 3 results) No results for input(s): PROBNP in the last 8760 hours. HbA1C: No results for input(s): HGBA1C in the last 72 hours. CBG: No results for input(s): GLUCAP in the last 168 hours. Lipid Profile: No results for input(s): CHOL, HDL, LDLCALC, TRIG, CHOLHDL, LDLDIRECT in the last 72 hours. Thyroid Function Tests: No results for input(s): TSH, T4TOTAL, FREET4, T3FREE, THYROIDAB in the last 72 hours. Anemia Panel: No results for input(s): VITAMINB12, FOLATE, FERRITIN, TIBC, IRON, RETICCTPCT in the last 72 hours. Urine analysis:    Component Value Date/Time   COLORURINE YELLOW 08/06/2019 0122   APPEARANCEUR TURBID (A) 08/06/2019 0122   LABSPEC 1.012 08/06/2019 0122     PHURINE 6.0 08/06/2019 0122   GLUCOSEU NEGATIVE 08/06/2019 0122   GLUCOSEU NEGATIVE 11/21/2016 1510   HGBUR SMALL (A) 08/06/2019 0122   BILIRUBINUR NEGATIVE 08/06/2019 0122   BILIRUBINUR n 09/17/2018 1601   KETONESUR NEGATIVE 08/06/2019 0122   PROTEINUR 100 (A) 08/06/2019 0122   UROBILINOGEN 0.2 09/17/2018 1601   UROBILINOGEN 0.2 06/26/2017 1546   NITRITE POSITIVE (A) 08/06/2019 0122   LEUKOCYTESUR LARGE (A) 08/06/2019 0122   Sepsis Labs: @LABRCNTIP (procalcitonin:4,lacticidven:4) )No results found for this or any previous visit (from the past 240 hour(s)).   Radiological Exams on Admission: CT RENAL STONE STUDY  Result Date: 08/06/2019 CLINICAL DATA:  Right flank and inguinal pain, dysuria EXAM: CT ABDOMEN AND PELVIS WITHOUT CONTRAST TECHNIQUE: Multidetector CT imaging of the abdomen and pelvis was performed following the standard protocol without IV contrast. COMPARISON:  07/25/2019 FINDINGS: Lower chest: No acute pleural or parenchymal lung disease. Moderate hiatal hernia unchanged. Hepatobiliary: No focal liver abnormality is seen. Status post cholecystectomy. No biliary dilatation. Pancreas: Unremarkable. No pancreatic ductal dilatation or surrounding inflammatory changes. Spleen: Normal in size without focal abnormality. Adrenals/Urinary Tract: No urinary tract calculi or obstructive uropathy within either kidney. Bladder is unremarkable. The adrenals are normal. Stomach/Bowel: Segmental wall thickening of the descending and sigmoid colon again noted, unchanged since prior exam and consistent with colitis. No bowel obstruction or ileus. Bowel resection and ileocolic anastomosis seen within the left lower quadrant, unchanged. Vascular/Lymphatic: Aortic atherosclerosis. No enlarged abdominal or pelvic lymph nodes. Reproductive: Status post hysterectomy. No adnexal masses. Other: No abdominal wall hernia or abnormality. No abdominopelvic ascites. Musculoskeletal: No acute or destructive bony  lesions. Reconstructed images demonstrate no additional findings. IMPRESSION: 1. Stable colitis involving the descending and sigmoid colon. 2. No urinary tract calculi or obstructive uropathy within either kidney. 3. Moderate hiatal hernia. 4. Aortic Atherosclerosis (ICD10-I70.0). Electronically Signed   By: Randa Ngo M.D.   On: 08/06/2019 02:07    Assessment/Plan   1. Acute UTI  - Presents with right flank pain and dysuria, has UA compatible with infection, and no obstructing stones on CT  - Urine was sent for culture and she was started on Rocephin in ED  - Continue Rocephin, follow culture and clinical course    2. Colitis  - Findings suspicious for colitis noted on CT in ED; she  was treated for suspected diverticulitis in April 2021 and had apparent resolution of that on CT from 6/17   - Her current abdominal pain is contralateral to the involved colon, she denies diarrhea or bleeding, and the presenting complaints are likely due to UTI rather than colitis  - She is advised to follow-up with her gastroenterologist after discharge    3. Hypothyroidism  - Continue Synthroid    4. COPD  - No cough or wheeze on admission  - Continue Flovent and as-needed albuterol    5. Anemia  - Appears stable with no bleeding, she will continue iron-supplementation    DVT prophylaxis: Lovenox  Code Status: Full  Family Communication: Discussed with patient  Disposition Plan:  Patient is from: Home  Anticipated d/c is to: Home  Anticipated d/c date is: 08/07/19 Patient currently: Pending improvement in symptoms with antibiotics  Consults called: None  Admission status: Observation     Vianne Bulls, MD [facility] Pager: See [email or web address]  If 7AM-7PM, please contact the daytime attending [email or web address]  08/06/2019, 3:05 AM

## 2019-08-06 NOTE — ED Provider Notes (Signed)
[facility] EMERGENCY DEPARTMENT Provider Note   CSN: [id number] Arrival date & time: 08/05/19  1445     History Chief Complaint  Patient presents with  . Flank Pain    [name] is a 85 y.o. female.  Patient presents to the emergency department for evaluation of flank pain and urinary symptoms.  Patient reports that yesterday she started to notice some diffuse lower abdominal discomfort with pain going up into the right side of the abdomen and back.  Today she started to notice a change in the odor of her urine and now she is urinating frequently and it hurts when she passes urine.  No associated fever, nausea or vomiting.        Past Medical History:  Diagnosis Date  . ABDOMINAL PAIN, CHRONIC 05/28/2008  . ALLERGIC RHINITIS 09/21/2006  . ANEMIA-NOS 09/21/2006  . ASTHMA 09/21/2006  . ASYMPTOMATIC POSTMENOPAUSAL STATUS 05/28/2008  . Cataract   . CHEST PAIN, ATYPICAL 05/28/2008  . COPD (chronic obstructive pulmonary disease) ([place])   . DDD (degenerative disc disease)   . DIVERTICULITIS, HX OF 09/21/2006  . GERD 09/21/2006  . HYPERCHOLESTEROLEMIA 06/05/2009  . HYPERTENSION 09/21/2006  . HYPOTHYROIDISM 09/21/2006  . Peptic ulcer disease   . Purpura ([place])   . RENAL INSUFFICIENCY 09/21/2006  . Shingles   . Small bowel obstruction ([place])   . Somatization disorder 04/25/2007  . UNSPECIFIED URINARY CALCULUS 05/28/2008    Patient Active Problem List   Diagnosis Date Noted  . Vitamin D deficiency 05/01/2019  . Arthralgia 08/21/2017  . Osteoporosis 11/21/2016  . Postmenopausal status 09/06/2016  . Other nonspecific abnormal finding of lung field 08/14/2015  . Hyponatremia 07/21/2014  . Menopausal state 07/18/2014  . Hematest positive stools 08/05/2013  . Anemia, blood loss 08/05/2013  . Cerumen impaction 06/03/2013  . History of adrenal disorder 10/15/2012  . Rash and nonspecific skin eruption 10/15/2012  . Iron deficiency anemia 06/20/2012  . Psychogenic  polydipsia 06/20/2012  . Nonspecific abnormal electrocardiogram (ECG) (EKG) 06/20/2012  . Polyuria 05/02/2012  . Pelvic pain in female 08/16/2011  . Leg ulcer ([place]) 06/16/2010  . Routine general medical examination at a health care facility 06/11/2010  . Encounter for long-term (current) use of other medications 06/11/2010  . HYPERCHOLESTEROLEMIA 06/05/2009  . UNSPECIFIED URINARY CALCULUS 05/28/2008  . CHEST PAIN, ATYPICAL 05/28/2008  . ABDOMINAL PAIN, CHRONIC 05/28/2008  . ASYMPTOMATIC POSTMENOPAUSAL STATUS 05/28/2008  . SOMATIZATION DISORDER 04/25/2007  . Hypothyroidism 09/21/2006  . Essential hypertension 09/21/2006  . ALLERGIC RHINITIS 09/21/2006  . ASTHMA 09/21/2006  . GERD 09/21/2006  . Disorder resulting from impaired renal function 09/21/2006  . DIVERTICULITIS, HX OF 09/21/2006    Past Surgical History:  Procedure Laterality Date  . ABDOMINAL HYSTERECTOMY  1981  . acute Nephritis  1958  . APPENDECTOMY  1959  . CESAREAN SECTION    . CHOLECYSTECTOMY  1983  . COLON SURGERY    . DENTAL SURGERY  1970  . NASAL SINUS SURGERY  1967  . Pulmonary thrombosis    . TONSILLECTOMY    . TUBAL LIGATION       OB History   No obstetric history on file.     Family History  Problem Relation Age of Onset  . Polycystic kidney disease Mother   . Pancreatic cancer Mother   . Other Father        Schamberg disease  . Bladder Cancer Father   . Hypertension Father   . Marfan syndrome Son   .  Hemochromatosis Son   . Cirrhosis Son   . Allergic rhinitis Sister   . Other Brother        bone issue as child; multiple fractures but seemed to age out of this  . Hemochromatosis Cousin   . Arthritis Sister     Social History   Tobacco Use  . Smoking status: Never Smoker  . Smokeless tobacco: Never Used  Vaping Use  . Vaping Use: Never used  Substance Use Topics  . Alcohol use: No  . Drug use: No    Home Medications Prior to Admission medications   Medication Sig Start Date  End Date Taking? Authorizing Provider  acetaminophen (TYLENOL) 325 MG tablet Take 650-975 mg by mouth daily.    Yes [provider]  Cholecalciferol (VITAMIN D3 PO) Take 1 tablet by mouth 2 (two) times a week. Saturday and Sunday   Yes [provider]  Ferrous Sulfate (IRON) 325 (65 FE) MG TABS Take 1 tablet by mouth daily.    Yes [provider]  fluticasone (FLOVENT HFA) 110 MCG/ACT inhaler INHALE 1 PUFF BY MOUTH TWICE A DAY Patient taking differently: Inhale 1 puff into the lungs 2 (two) times daily.  07/12/19  Yes Caren Macadam, MD  levothyroxine (SYNTHROID) 50 MCG tablet TAKE 1 TABLET BY MOUTH EVERY DAY Patient taking differently: Take 50 mcg by mouth every Monday, Tuesday, Wednesday, Thursday, and Friday.  12/26/18  Yes Koberlein, Steele Berg, MD  levothyroxine (SYNTHROID) 75 MCG tablet Take by mouth 2 days per week Patient taking differently: Take 75 mcg by mouth 2 (two) times a week. Take on Saturday and Sunday 08/31/18  Yes Koberlein, Steele Berg, MD  PROAIR HFA 108 (90 Base) MCG/ACT inhaler INHALE 2 PUFFS INTO THE LUNGS EVERY 4 HOURS AS NEEDED FOR WHEEZING OR SHORTNESS OF BREATH Patient taking differently: Inhale 2 puffs into the lungs every 4 (four) hours as needed for wheezing or shortness of breath.  02/05/19  Yes Caren Macadam, MD  Spacer/Aero-Holding Chambers (E-Z SPACER) inhaler Use as instructed 11/08/17   Caren Macadam, MD    Allergies    [place] nut (berthollefia [place]) skin test, Fluconazole, Metronidazole, Pepcid [famotidine], Shellfish allergy, Macrobid [nitrofurantoin], and Pantoprazole  Review of Systems   Review of Systems  Gastrointestinal: Positive for abdominal pain.  Genitourinary: Positive for dysuria, flank pain and frequency.  All other systems reviewed and are negative.   Physical Exam Updated Vital Signs BP (!) 141/48 (BP Location: Left Arm)   Pulse 75   Temp 98.6 F (37 C) (Oral)   Resp 18   SpO2 99%   Physical  Exam Vitals and nursing note reviewed.  Constitutional:      General: She is not in acute distress.    Appearance: Normal appearance. She is well-developed.  HENT:     Head: Normocephalic and atraumatic.     Right Ear: Hearing normal.     Left Ear: Hearing normal.     Nose: Nose normal.  Eyes:     Conjunctiva/sclera: Conjunctivae normal.     Pupils: Pupils are equal, round, and reactive to light.  Cardiovascular:     Rate and Rhythm: Regular rhythm.     Heart sounds: S1 normal and S2 normal. No murmur heard.  No friction rub. No gallop.   Pulmonary:     Effort: Pulmonary effort is normal. No respiratory distress.     Breath sounds: Normal breath sounds.  Chest:     Chest wall: No tenderness.  Abdominal:     General: Bowel sounds are normal.     Palpations: Abdomen is soft.     Tenderness: There is abdominal tenderness in the right lower quadrant, suprapubic area and left lower quadrant. There is no guarding or rebound. Negative signs include Murphy's sign and McBurney's sign.     Hernia: No hernia is present.  Musculoskeletal:        General: Normal range of motion.     Cervical back: Normal range of motion and neck supple.  Skin:    General: Skin is warm and dry.     Findings: No rash.  Neurological:     Mental Status: She is alert and oriented to person, place, and time.     GCS: GCS eye subscore is 4. GCS verbal subscore is 5. GCS motor subscore is 6.     Cranial Nerves: No cranial nerve deficit.     Sensory: No sensory deficit.     Coordination: Coordination normal.  Psychiatric:        Speech: Speech normal.        Behavior: Behavior normal.        Thought Content: Thought content normal.     ED Results / Procedures / Treatments   Labs (all labs ordered are listed, but only abnormal results are displayed) Labs Reviewed  URINALYSIS, ROUTINE W REFLEX MICROSCOPIC - Abnormal; Notable for the following components:      Result Value   APPearance TURBID (*)    Hgb  urine dipstick SMALL (*)    Protein, ur 100 (*)    Nitrite POSITIVE (*)    Leukocytes,Ua LARGE (*)    WBC, UA >50 (*)    Bacteria, UA MANY (*)    All other components within normal limits  BASIC METABOLIC PANEL - Abnormal; Notable for the following components:   Sodium 132 (*)    Chloride 97 (*)    Glucose, Bld 104 (*)    All other components within normal limits  CBC - Abnormal; Notable for the following components:   RBC 3.51 (*)    Hemoglobin 10.8 (*)    HCT 33.7 (*)    All other components within normal limits  URINE CULTURE  SARS CORONAVIRUS 2 BY RT PCR (HOSPITAL ORDER, [facility])    EKG None  Radiology CT RENAL STONE STUDY  Result Date: 08/06/2019 CLINICAL DATA:  Right flank and inguinal pain, dysuria EXAM: CT ABDOMEN AND PELVIS WITHOUT CONTRAST TECHNIQUE: Multidetector CT imaging of the abdomen and pelvis was performed following the standard protocol without IV contrast. COMPARISON:  07/25/2019 FINDINGS: Lower chest: No acute pleural or parenchymal lung disease. Moderate hiatal hernia unchanged. Hepatobiliary: No focal liver abnormality is seen. Status post cholecystectomy. No biliary dilatation. Pancreas: Unremarkable. No pancreatic ductal dilatation or surrounding inflammatory changes. Spleen: Normal in size without focal abnormality. Adrenals/Urinary Tract: No urinary tract calculi or obstructive uropathy within either kidney. Bladder is unremarkable. The adrenals are normal. Stomach/Bowel: Segmental wall thickening of the descending and sigmoid colon again noted, unchanged since prior exam and consistent with colitis. No bowel obstruction or ileus. Bowel resection and ileocolic anastomosis seen within the left lower quadrant, unchanged. Vascular/Lymphatic: Aortic atherosclerosis. No enlarged abdominal or pelvic lymph nodes. Reproductive: Status post hysterectomy. No adnexal masses. Other: No abdominal wall hernia or abnormality. No abdominopelvic  ascites. Musculoskeletal: No acute or destructive bony lesions. Reconstructed images demonstrate no additional findings. IMPRESSION: 1. Stable colitis involving the descending and sigmoid colon. 2.  No urinary tract calculi or obstructive uropathy within either kidney. 3. Moderate hiatal hernia. 4. Aortic Atherosclerosis (ICD10-I70.0). Electronically Signed   By: Randa Ngo M.D.   On: 08/06/2019 02:07    Procedures Procedures (including critical care time)  Medications Ordered in ED Medications  cefTRIAXone (ROCEPHIN) 2 g in sodium chloride 0.9 % 100 mL IVPB (has no administration in time range)    ED Course  I have reviewed the triage vital signs and the nursing notes.  Pertinent labs & imaging results that were available during my care of the patient were reviewed by me and considered in my medical decision making (see chart for details).    MDM Rules/Calculators/A&P                          Patient presents to the emergency department for evaluation of abdominal pain with urinary symptoms.  Reviewing her records does reveal that she had a recent diverticulitis episode.  She had a repeat CT scan on June 17 that showed resolution.  Examination today reveals diffuse lower abdominal tenderness but specifically mostly in the suprapubic region.  This is in the setting of malodorous urine, urinary frequency, decreased urine volume and pain with urination.  Urinalysis obviously shows infection.  Culture sent.  CT scan performed.  She reports a history of kidney stones.  No stones were seen on most recent CTs, however these were performed with IV contrast.  A noncontrast CT was performed today.  No evidence of ureterolithiasis.  Findings were discussed with reading radiologist, Dr. Owens Shark.  He feels that there are changes in the colon that would be consistent with colitis, different than the changes seen in April when she had diverticulitis.  Antibiotic coverage is going to be difficult.  Ideally I  would treat her with Rocephin and Flagyl to cover UTI as well as diverticulitis/colitis.  She does not, however, tolerate Flagyl.  It is listed as an allergy but it appears that she has had shortness of breath, nausea and vomiting in the past.  She is not willing to try a dose here in the ER.  She was recently treated with Augmentin for her diverticulitis but I am concerned this would not cover the obvious urinary tract infection.  She was therefore administered Rocephin and will admit patient for observation.  Final Clinical Impression(s) / ED Diagnoses Final diagnoses:  Urinary tract infection without hematuria, site unspecified  Colitis    Rx / DC Orders ED Discharge Orders    None       Orpah Greek, MD 08/08/19 [phone]

## 2019-08-07 ENCOUNTER — Encounter (HOSPITAL_COMMUNITY): Payer: Self-pay | Admitting: Infectious Disease

## 2019-08-07 ENCOUNTER — Inpatient Hospital Stay (HOSPITAL_COMMUNITY): Payer: Medicare Other

## 2019-08-07 ENCOUNTER — Other Ambulatory Visit (HOSPITAL_COMMUNITY): Payer: Medicare Other

## 2019-08-07 ENCOUNTER — Other Ambulatory Visit: Payer: Self-pay

## 2019-08-07 LAB — BASIC METABOLIC PANEL
Anion gap: 7 (ref 5–15)
BUN: 29 mg/dL — ABNORMAL HIGH (ref 8–23)
CO2: 26 mmol/L (ref 22–32)
Calcium: 9.1 mg/dL (ref 8.9–10.3)
Chloride: 102 mmol/L (ref 98–111)
Creatinine, Ser: 0.54 mg/dL (ref 0.44–1.00)
GFR calc Af Amer: >60 mL/min (ref >60)
GFR calc non Af Amer: >60 mL/min (ref >60)
Glucose, Bld: 105 mg/dL — ABNORMAL HIGH (ref 70–99)
Potassium: 4.3 mmol/L (ref 3.5–5.1)
Sodium: 135 mmol/L (ref 135–145)

## 2019-08-07 LAB — CBC
HCT: 29.3 % — ABNORMAL LOW (ref 36.0–46.0)
Hemoglobin: 9.3 g/dL — ABNORMAL LOW (ref 12.0–15.0)
MCH: 30.6 pg (ref 26.0–34.0)
MCHC: 31.7 g/dL (ref 30.0–36.0)
MCV: 96.4 fL (ref 80.0–100.0)
Platelets: 352 10*3/uL (ref 150–400)
RBC: 3.04 MIL/uL — ABNORMAL LOW (ref 3.87–5.11)
RDW: 13.1 % (ref 11.5–15.5)
WBC: 8.9 10*3/uL (ref 4.0–10.5)
nRBC: 0 % (ref 0.0–0.2)

## 2019-08-07 MED ORDER — TRAMADOL HCL 50 MG PO TABS: 50.0000 mg | ORAL_TABLET | Freq: Four times a day (QID) | ORAL | Status: DC | PRN

## 2019-08-07 MED ORDER — MORPHINE SULFATE (PF) 2 MG/ML IV SOLN: 1.0000 mg | INTRAVENOUS | Status: DC | PRN

## 2019-08-07 NOTE — Progress Notes (Addendum)
PROGRESS NOTE    [name]  DJM:[id number] DOB: November 29, 1934 DOA: 08/05/2019 PCP: Caren Macadam, MD   Chief Complaint  Patient presents with  . Flank Pain    Brief Narrative:   85 year old lady prior history of hypothyroidism, iron deficiency anemia, acute diverticulitis, COPD presents with right flank pain dysuria and hematuria.  She was admitted for possible urinary tract infection.  And was started on IV Rocephin.    Assessment & Plan:   Principal Problem:   UTI (urinary tract infection) Active Problems:   Hypothyroidism   Iron deficiency anemia   Hyponatremia   COPD (chronic obstructive pulmonary disease) (HCC)   Colitis   Urinary tract infection Urine cultures are growing 100,000 Citrobacter and sensitivities are pending. Patient currently on IV Rocephin, follow-up sensitivities and transition to oral antibiotics when appropriate. Pain control and PT evaluation ordered . Gently hydrate. Worsening right flank pain today.  Ultrasound renal ordered for evaluation of pyelonephritis/renal abscess. Patient currently afebrile and No leukocytosis   Hypothyroidism Continue with Synthroid.   COPD No wheezing heard.   History of diverticulosis No abdominal pain, nausea or vomiting this admission.    History of iron deficiency anemia Hemoglobin stable between 9-10.   Hyponatremia:  Resolved.    DVT prophylaxis:  Code Status: full code.  Family Communication: none at bedside.  Disposition:   Status is: Inpatient  Remains inpatient appropriate because:IV treatments appropriate due to intensity of illness or inability to take PO   Dispo: The patient is from: Home              Anticipated d/c is to: pending.               Anticipated d/c date is: 2 days              Patient currently is not medically stable to d/c.       Consultants:   None.    Procedures: none  Antimicrobials: none  Subjective: Patient reports back pain on the right  side started last night.  No nausea vomiting or abdominal pain.  No chest pain or shortness of breath.  Objective: Vitals:   08/06/19 1941 08/06/19 2025 08/07/19 0450 08/07/19 1215  BP:  118/65 124/63 (!) 142/79  Pulse:  76 77 75  Resp:  16 16 16   Temp:  97.6 F (36.4 C) 97.9 F (36.6 C) 98.7 F (37.1 C)  TempSrc:  Oral Oral Oral  SpO2: 98% 96% 96% 98%  Weight:      Height:        Intake/Output Summary (Last 24 hours) at 08/07/2019 1614 Last data filed at 08/07/2019 0850 Gross per 24 hour  Intake 740 ml  Output --  Net 740 ml   Filed Weights   08/06/19 0500  Weight: 65.3 kg    Examination:  General exam: Appears calm and comfortable  Respiratory system: Clear to auscultation. Respiratory effort normal. Cardiovascular system: S1 & S2 heard, RRR. No JVD,No pedal edema. Gastrointestinal system: Abdomen is nondistended, soft and nontender.  Normal bowel sounds heard, right-sided flank pain present.. Central nervous system: Alert and oriented. No focal neurological deficits. Extremities: Symmetric 5 x 5 power. Skin: No rashes, lesions or ulcers Psychiatry:  Mood & affect appropriate.     Data Reviewed: I have personally reviewed following labs and imaging studies  CBC: Recent Labs  Lab 08/05/19 1456 08/07/19 0227  WBC 7.8 8.9  HGB 10.8* 9.3*  HCT 33.7* 29.3*  MCV 96.0 96.4  PLT 350 262    Basic Metabolic Panel: Recent Labs  Lab 08/05/19 1456 08/07/19 0227  NA 132* 135  K 4.5 4.3  CL 97* 102  CO2 27 26  GLUCOSE 104* 105*  BUN 15 29*  CREATININE 0.62 0.54  CALCIUM 9.9 9.1    GFR: Estimated Creatinine Clearance: 47.9 mL/min (by C-G formula based on SCr of 0.54 mg/dL).  Liver Function Tests: No results for input(s): AST, ALT, ALKPHOS, BILITOT, PROT, ALBUMIN in the last 168 hours.  CBG: No results for input(s): GLUCAP in the last 168 hours.   Recent Results (from the past 240 hour(s))  Urine Culture     Status: Abnormal (Preliminary result)    Collection Time: 08/06/19  1:22 AM   Specimen: Urine, Random  Result Value Ref Range Status   Specimen Description URINE, RANDOM  Final   Special Requests NONE  Final   Culture (A)  Final    >=100,000 COLONIES/mL CITROBACTER BRAAKII SUSCEPTIBILITIES TO FOLLOW Performed at [facility], [address], [place], [place] [address]    Report Status PENDING  Incomplete  SARS Coronavirus 2 by RT PCR (hospital order, performed in [facility] hospital lab) Nasopharyngeal Nasopharyngeal Swab     Status: None   Collection Time: 08/06/19  2:51 AM   Specimen: Nasopharyngeal Swab  Result Value Ref Range Status   SARS Coronavirus 2 NEGATIVE NEGATIVE Final    Comment: (NOTE) SARS-CoV-2 target nucleic acids are NOT DETECTED.  The SARS-CoV-2 RNA is generally detectable in upper and lower respiratory specimens during the acute phase of infection. The lowest concentration of SARS-CoV-2 viral copies this assay can detect is 250 copies / mL. A negative result does not preclude SARS-CoV-2 infection and should not be used as the sole basis for treatment or other patient management decisions.  A negative result may occur with improper specimen collection / handling, submission of specimen other than nasopharyngeal swab, presence of viral mutation(s) within the areas targeted by this assay, and inadequate number of viral copies (<250 copies / mL). A negative result must be combined with clinical observations, patient history, and epidemiological information.  Fact Sheet for Patients:   StrictlyIdeas.no  Fact Sheet for Healthcare Providers: BankingDealers.co.za  This test is not yet approved or  cleared by the [place] FDA and has been authorized for detection and/or diagnosis of SARS-CoV-2 by FDA under an Emergency Use Authorization (EUA).  This EUA will remain in effect (meaning this test can be used) for the duration of the COVID-19  declaration under Section 564(b)(1) of the Act, 21 U.S.C. section 360bbb-3(b)(1), unless the authorization is terminated or revoked sooner.  Performed at [facility], [place] [address], [place], [place] [address]          Radiology Studies: CT RENAL STONE STUDY  Result Date: 08/06/2019 CLINICAL DATA:  Right flank and inguinal pain, dysuria EXAM: CT ABDOMEN AND PELVIS WITHOUT CONTRAST TECHNIQUE: Multidetector CT imaging of the abdomen and pelvis was performed following the standard protocol without IV contrast. COMPARISON:  07/25/2019 FINDINGS: Lower chest: No acute pleural or parenchymal lung disease. Moderate hiatal hernia unchanged. Hepatobiliary: No focal liver abnormality is seen. Status post cholecystectomy. No biliary dilatation. Pancreas: Unremarkable. No pancreatic ductal dilatation or surrounding inflammatory changes. Spleen: Normal in size without focal abnormality. Adrenals/Urinary Tract: No urinary tract calculi or obstructive uropathy within either kidney. Bladder is unremarkable. The adrenals are normal. Stomach/Bowel: Segmental wall thickening of the descending and sigmoid colon again noted, unchanged since  prior exam and consistent with colitis. No bowel obstruction or ileus. Bowel resection and ileocolic anastomosis seen within the left lower quadrant, unchanged. Vascular/Lymphatic: Aortic atherosclerosis. No enlarged abdominal or pelvic lymph nodes. Reproductive: Status post hysterectomy. No adnexal masses. Other: No abdominal wall hernia or abnormality. No abdominopelvic ascites. Musculoskeletal: No acute or destructive bony lesions. Reconstructed images demonstrate no additional findings. IMPRESSION: 1. Stable colitis involving the descending and sigmoid colon. 2. No urinary tract calculi or obstructive uropathy within either kidney. 3. Moderate hiatal hernia. 4. Aortic Atherosclerosis (ICD10-I70.0). Electronically Signed   By: Randa Ngo M.D.   On: 08/06/2019 02:07   DG  HIP UNILAT WITH PELVIS 2-3 VIEWS LEFT  Result Date: 08/06/2019 CLINICAL DATA:  Hip pain, left side greater right. EXAM: DG HIP (WITH OR WITHOUT PELVIS) 2-3V LEFT COMPARISON:  CT 07/25/2019. FINDINGS: Diffuse osteopenia. Degenerative changes lumbar spine and both hips. Degenerative changes most prominent about the right hip. No acute bony abnormality identified. No evidence of fracture or dislocation. Peripheral vascular calcification. IMPRESSION: 1. Diffuse osteopenia. Diffuse degenerative change lumbar spine and both hips. Degenerative changes are particularly severe about the right hip. No acute abnormality identified. Left hip is intact. 2.  Peripheral vascular disease. Electronically Signed   By: Marcello Moores  Register   On: 08/06/2019 12:45   DG HIP UNILAT WITH PELVIS 2-3 VIEWS RIGHT  Result Date: 08/06/2019 CLINICAL DATA:  Right hip pain EXAM: DG HIP (WITH OR WITHOUT PELVIS) 2V RIGHT COMPARISON:  None. FINDINGS: Degenerative changes of the right hip joint are noted with loss of the superior joint space with bone-on-bone contact and increased sclerosis identified. No acute fracture or dislocation is noted. Mild remodeling of the femoral head is seen. IMPRESSION: Degenerative change without acute abnormality. Electronically Signed   By: Inez Catalina M.D.   On: 08/06/2019 12:44        Scheduled Meds: . budesonide (PULMICORT) nebulizer solution  0.25 mg Nebulization BID  . enoxaparin (LOVENOX) injection  40 mg Subcutaneous Q24H  . levothyroxine  50 mcg Oral Q MTWThF  . [START ON 08/08/2019] levothyroxine  75 mcg Oral Once per day on Mon Thu   Continuous Infusions: . cefTRIAXone (ROCEPHIN)  IV 1 g (08/07/19 0519)     LOS: 1 day        Hosie Poisson, MD [facility]   To contact the attending provider between 7A-7P or the covering provider during after hours 7P-7A, please log into the web site [email or web address] and access using universal [employer] password for that web site. If you do  not have the password, please call the hospital operator.  08/07/2019, 4:14 PM

## 2019-08-08 LAB — BASIC METABOLIC PANEL
Anion gap: 7 (ref 5–15)
BUN: 24 mg/dL — ABNORMAL HIGH (ref 8–23)
CO2: 24 mmol/L (ref 22–32)
Calcium: 9 mg/dL (ref 8.9–10.3)
Chloride: 104 mmol/L (ref 98–111)
Creatinine, Ser: 0.54 mg/dL (ref 0.44–1.00)
GFR calc Af Amer: >60 mL/min (ref >60)
GFR calc non Af Amer: >60 mL/min (ref >60)
Glucose, Bld: 96 mg/dL (ref 70–99)
Potassium: 4.3 mmol/L (ref 3.5–5.1)
Sodium: 135 mmol/L (ref 135–145)

## 2019-08-08 LAB — CBC
HCT: 27.3 % — ABNORMAL LOW (ref 36.0–46.0)
Hemoglobin: 8.9 g/dL — ABNORMAL LOW (ref 12.0–15.0)
MCH: 31.1 pg (ref 26.0–34.0)
MCHC: 32.6 g/dL (ref 30.0–36.0)
MCV: 95.5 fL (ref 80.0–100.0)
Platelets: 314 10*3/uL (ref 150–400)
RBC: 2.86 MIL/uL — ABNORMAL LOW (ref 3.87–5.11)
RDW: 13.2 % (ref 11.5–15.5)
WBC: 8.5 10*3/uL (ref 4.0–10.5)
nRBC: 0 % (ref 0.0–0.2)

## 2019-08-08 LAB — URINE CULTURE: Culture: >=100000 — AB

## 2019-08-08 NOTE — Evaluation (Signed)
Occupational Therapy Evaluation Patient Details Name: [name] MRN: [id number] DOB: [birth year] Today's Date: 08/08/2019    History of Present Illness Pt is an 85 y/o female admitted secondary to R flank pain, thought to be from UTI. PMH includes COPD, asthma, and HTN.    Clinical Impression   PTA pt living in senior apartment community, functioning at mod I level with use of cane. At time of eval, pt is completing bed mobility at mod I level and sit <> stands with min guard assist and RW. Pt demonstrates ability to complete BADL at mod I level with increased time due to pain. Discussed appropriate ECS strategies to implement into her usual daily routines. Also discussed fall prevention strategies, which pt was able to verbalize. No further OT f/u necessary. OT will sign off.    Follow Up Recommendations  No OT follow up    Equipment Recommendations  None recommended by OT    Recommendations for Other Services       Precautions / Restrictions Precautions Precautions: Fall Restrictions Weight Bearing Restrictions: No      Mobility Bed Mobility Overal bed mobility: Modified Independent                Transfers Overall transfer level: Needs assistance Equipment used: Rolling walker (2 wheeled) Transfers: Sit to/from Stand Sit to Stand: Min guard         General transfer comment: Min guard for safety. Demonstrated safe hand placement.     Balance Overall balance assessment: Needs assistance Sitting-balance support: No upper extremity supported;Feet supported Sitting balance-Leahy Scale: Good     Standing balance support: Bilateral upper extremity supported;No upper extremity supported Standing balance-Leahy Scale: Fair Standing balance comment: Able to maintain static standing without UE support                            ADL either performed or assessed with clinical judgement   ADL Overall ADL's : At baseline;Modified independent                                        General ADL Comments: Pt demonstrates ability to complete BADL at mod I level, which is baseline. She is able to complete functional mobility at mod I for toilet transfers. And with increased time due to pain can complete LB BADLs     Vision Patient Visual Report: No change from baseline       Perception     Praxis      Pertinent Vitals/Pain Pain Assessment: Faces Faces Pain Scale: Hurts even more Pain Location: generalized Pain Descriptors / Indicators: Aching;Guarding Pain Intervention(s): Monitored during session     Hand Dominance     Extremity/Trunk Assessment Upper Extremity Assessment Upper Extremity Assessment: Overall WFL for tasks assessed   Lower Extremity Assessment Lower Extremity Assessment: Defer to PT evaluation RLE Deficits / Details: Reports R hip pain at baseline    Cervical / Trunk Assessment Cervical / Trunk Assessment: Normal   Communication Communication Communication: No difficulties   Cognition Arousal/Alertness: Awake/alert Behavior During Therapy: WFL for tasks assessed/performed Overall Cognitive Status: Within Functional Limits for tasks assessed                                     General  Comments       Exercises     Shoulder Instructions      Home Living Family/patient expects to be discharged to:: Private residence Living Arrangements: Alone Available Help at Discharge: Family Type of Home: Independent living facility Home Access: Level entry     Home Layout: One level     Bathroom Shower/Tub: Teacher, early years/pre: Handicapped height     Home Equipment: Grab bars - tub/shower;Cane - single point;Walker - 2 wheels;Shower seat   Additional Comments: Living in senior community. Has life alert button       Prior Functioning/Environment Level of Independence: Independent with assistive device(s)        Comments: Uses cane for ambulation, able to  complete BADL/IADL        OT Problem List: Decreased knowledge of use of DME or AE;Pain;Impaired balance (sitting and/or standing)      OT Treatment/Interventions:      OT Goals(Current goals can be found in the care plan section) Acute Rehab OT Goals Patient Stated Goal: to feel better OT Goal Formulation: All assessment and education complete, DC therapy  OT Frequency:     Barriers to D/C:            Co-evaluation              AM-PAC OT "6 Clicks" Daily Activity     Outcome Measure Help from another person eating meals?: None Help from another person taking care of personal grooming?: None Help from another person toileting, which includes using toliet, bedpan, or urinal?: None Help from another person bathing (including washing, rinsing, drying)?: None Help from another person to put on and taking off regular upper body clothing?: None Help from another person to put on and taking off regular lower body clothing?: None 6 Click Score: 24   End of Session Equipment Utilized During Treatment: Rolling walker Nurse Communication: Mobility status  Activity Tolerance: Patient tolerated treatment well Patient left: in bed;with call bell/phone within reach  OT Visit Diagnosis: Other abnormalities of gait and mobility (R26.89);Pain Pain - part of body:  (generalized)                Time: 8127-5170 OT Time Calculation (min): 19 min Charges:  OT General Charges $OT Visit: 1 Visit OT Evaluation $OT Eval Low Complexity: [place], MSOT, OTR/L [facility] Office Number: [phone] Pager: [phone]  Zenovia Jarred 08/08/2019, 5:04 PM

## 2019-08-08 NOTE — Evaluation (Signed)
Physical Therapy Evaluation Patient Details Name: [name] MRN: [id number] DOB: 18-Jan-1935 Today's Date: 08/08/2019   History of Present Illness  Pt is an 85 y/o female admitted secondary to R flank pain, thought to be from UTI. PMH includes COPD, asthma, and HTN.   Clinical Impression  Pt admitted secondary to problem above with deficits below. Pt requiring min guard A for mobility using RW. Pt requesting to stay in the room as her stomach was upset. Pt currently lives alone, however, reports son checks on her daily. Will continue to follow acutely to maximize functional mobility independence and safety.     Follow Up Recommendations No PT follow up (pt refusing HHPT )    Equipment Recommendations  None recommended by PT    Recommendations for Other Services       Precautions / Restrictions Precautions Precautions: Fall Restrictions Weight Bearing Restrictions: No      Mobility  Bed Mobility Overal bed mobility: Modified Independent                Transfers Overall transfer level: Needs assistance Equipment used: Rolling walker (2 wheeled) Transfers: Sit to/from Stand Sit to Stand: Min guard         General transfer comment: Min guard for safety. Demonstrated safe hand placement.   Ambulation/Gait Ambulation/Gait assistance: Supervision;Min guard Gait Distance (Feet): 40 Feet Assistive device: Rolling walker (2 wheeled) Gait Pattern/deviations: Step-through pattern;Decreased stride length Gait velocity: Decreased    General Gait Details: No LOB noted with use of RW. Guarded gait secondary to pain. Pt reports not feeling well, so mobility limited to within the room. Educated about using RW at home to increase safety.   Stairs            Wheelchair Mobility    Modified Rankin (Stroke Patients Only)       Balance Overall balance assessment: Needs assistance Sitting-balance support: No upper extremity supported;Feet supported Sitting  balance-Leahy Scale: Good     Standing balance support: Bilateral upper extremity supported;No upper extremity supported Standing balance-Leahy Scale: Fair Standing balance comment: Able to maintain static standing without UE support                              Pertinent Vitals/Pain Pain Assessment: Faces Faces Pain Scale: Hurts even more Pain Location: generalized Pain Descriptors / Indicators: Aching;Guarding Pain Intervention(s): Monitored during session;Limited activity within patient's tolerance;Repositioned    Home Living Family/patient expects to be discharged to:: Private residence Living Arrangements: Alone Available Help at Discharge: Family Type of Home: Apartment Home Access: Level entry     Home Layout: One level Home Equipment: Grab bars - tub/shower;Cane - single point;Walker - 2 wheels;Shower seat Additional Comments: Living in senior community. Has life alert button     Prior Function Level of Independence: Independent with assistive device(s)         Comments: Uses cane for ambulation      Hand Dominance        Extremity/Trunk Assessment   Upper Extremity Assessment Upper Extremity Assessment: Defer to OT evaluation    Lower Extremity Assessment Lower Extremity Assessment: Generalized weakness;RLE deficits/detail RLE Deficits / Details: Reports R hip pain at baseline     Cervical / Trunk Assessment Cervical / Trunk Assessment: Normal  Communication   Communication: No difficulties  Cognition Arousal/Alertness: Awake/alert Behavior During Therapy: WFL for tasks assessed/performed Overall Cognitive Status: Within Functional Limits for tasks assessed  General Comments      Exercises     Assessment/Plan    PT Assessment Patient needs continued PT services  PT Problem List Decreased strength;Decreased activity tolerance;Decreased mobility       PT Treatment  Interventions Gait training;DME instruction;Therapeutic activities;Functional mobility training;Therapeutic exercise;Balance training;Patient/family education    PT Goals (Current goals can be found in the Care Plan section)  Acute Rehab PT Goals Patient Stated Goal: to feel better PT Goal Formulation: With patient Time For Goal Achievement: 08/22/19 Potential to Achieve Goals: Good    Frequency Min 3X/week   Barriers to discharge        Co-evaluation               AM-PAC PT "6 Clicks" Mobility  Outcome Measure Help needed turning from your back to your side while in a flat bed without using bedrails?: None Help needed moving from lying on your back to sitting on the side of a flat bed without using bedrails?: None Help needed moving to and from a bed to a chair (including a wheelchair)?: A Little Help needed standing up from a chair using your arms (e.g., wheelchair or bedside chair)?: A Little Help needed to walk in hospital room?: A Little Help needed climbing 3-5 steps with a railing? : A Lot 6 Click Score: 19    End of Session   Activity Tolerance: Treatment limited secondary to medical complications (Comment) (reports stomach is upset) Patient left: in bed;with call bell/phone within reach Nurse Communication: Mobility status PT Visit Diagnosis: Muscle weakness (generalized) (M62.81)    Time: 7209-4709 PT Time Calculation (min) (ACUTE ONLY): 11 min   Charges:   PT Evaluation $PT Eval Low Complexity: 1 Low          Lou Miner, DPT  Acute Rehabilitation Services  Pager: [phone] Office: [phone]   Rudean Hitt 08/08/2019, 2:15 PM

## 2019-08-08 NOTE — Progress Notes (Signed)
PROGRESS NOTE    [name]  TDS:[id number] DOB: 12/16/1934 DOA: 08/05/2019 PCP: Caren Macadam, MD   Chief Complaint  Patient presents with  . Flank Pain    Brief Narrative:   85 year old lady prior history of hypothyroidism, iron deficiency anemia, acute diverticulitis, COPD presents with right flank pain dysuria and hematuria.  She was admitted for possible urinary tract infection.  And was started on IV Rocephin.    Assessment & Plan:   Principal Problem:   UTI (urinary tract infection) Active Problems:   Hypothyroidism   Iron deficiency anemia   Hyponatremia   COPD (chronic obstructive pulmonary disease) (HCC)   Colitis   Urinary tract infection Urine cultures are growing 100,000 Citrobacter and sensitivities done.  Patient currently on IV Rocephin,  Day 3 , transition to oral antibiotics in am .  Pain control and PT evaluation ordered . Gently hydrate. US renal does not show any hydronephrosis or pyelonephritis.  Patient remains afbrile without any leukocytosis.   Hypothyroidism Continue with synthroid.    COPD No wheezing heard.  Resume albuterol as needed.   History of diverticulosis Denies any nausea vomiting or abdominal pain    History of iron deficiency anemia Hemoglobin stable between 9-10.   Hyponatremia:  Resolved.    DVT prophylaxis:  Code Status: full code.  Family Communication: none at bedside.  Disposition:   Status is: Inpatient  Remains inpatient appropriate because:IV treatments appropriate due to intensity of illness or inability to take PO   Dispo: The patient is from: ALF              Anticipated d/c is to: pending.               Anticipated d/c date is: 1 day              Patient currently is not medically stable to d/c.       Consultants:   None.    Procedures: none  Antimicrobials: none  Subjective: Pt reports her flank pain resolved.  No nausea, vomiting or abdominal pain. Pt reports having a  headache and doe snto want to be discharged today.   Objective: Vitals:   08/07/19 2019 08/08/19 0447 08/08/19 0757 08/08/19 1258  BP:  107/90  131/68  Pulse:  70 72 78  Resp:  16 16 18   Temp:  97.6 F (36.4 C)  97.8 F (36.6 C)  TempSrc:  Oral  Oral  SpO2: 98% 97% 96% 99%  Weight:      Height:        Intake/Output Summary (Last 24 hours) at 08/08/2019 1336 Last data filed at 08/08/2019 0900 Gross per 24 hour  Intake 680 ml  Output 300 ml  Net 380 ml   Filed Weights   08/06/19 0500  Weight: 65.3 kg    Examination:  General exam: Alert and comfortable not in any kind of distress Respiratory system: Clear to auscultation bilaterally, no wheezing or rhonchi  cardiovascular system: S1-S2 heard, regular rate rhythm, no JVD, no pedal edema Gastrointestinal system: Abdomen is soft, nontender, nondistended, bowel sounds normal Central nervous system: Alert and oriented to place and person.  extremities: no pedal edema.  Skin: No rashes.  Psychiatry:  Mood is appropriate.     Data Reviewed: I have personally reviewed following labs and imaging studies  CBC: Recent Labs  Lab 08/05/19 1456 08/07/19 0227 08/08/19 0122  WBC 7.8 8.9 8.5  HGB 10.8* 9.3* 8.9*  HCT 33.7* 29.3*  27.3*  MCV 96.0 96.4 95.5  PLT 350 352 888    Basic Metabolic Panel: Recent Labs  Lab 08/05/19 1456 08/07/19 0227 08/08/19 0122  NA 132* 135 135  K 4.5 4.3 4.3  CL 97* 102 104  CO2 27 26 24   GLUCOSE 104* 105* 96  BUN 15 29* 24*  CREATININE 0.62 0.54 0.54  CALCIUM 9.9 9.1 9.0    GFR: Estimated Creatinine Clearance: 47.9 mL/min (by C-G formula based on SCr of 0.54 mg/dL).  Liver Function Tests: No results for input(s): AST, ALT, ALKPHOS, BILITOT, PROT, ALBUMIN in the last 168 hours.  CBG: No results for input(s): GLUCAP in the last 168 hours.   Recent Results (from the past 240 hour(s))  Urine Culture     Status: Abnormal   Collection Time: 08/06/19  1:22 AM   Specimen: Urine,  Random  Result Value Ref Range Status   Specimen Description URINE, RANDOM  Final   Special Requests   Final    NONE Performed at [facility], [address] [place], [place] [address]    Culture >=100,000 COLONIES/mL CITROBACTER BRAAKII (A)  Final   Report Status 08/08/2019 FINAL  Final   Organism ID, Bacteria CITROBACTER BRAAKII (A)  Final      Susceptibility   Citrobacter braakii - MIC*    CEFAZOLIN >=64 RESISTANT Resistant     CEFTRIAXONE <=0.25 SENSITIVE Sensitive     CIPROFLOXACIN <=0.25 SENSITIVE Sensitive     GENTAMICIN <=1 SENSITIVE Sensitive     IMIPENEM 0.5 SENSITIVE Sensitive     NITROFURANTOIN <=16 SENSITIVE Sensitive     TRIMETH/SULFA <=20 SENSITIVE Sensitive     PIP/TAZO <=4 SENSITIVE Sensitive     * >=100,000 COLONIES/mL CITROBACTER BRAAKII  SARS Coronavirus 2 by RT PCR (hospital order, performed in [place] hospital lab) Nasopharyngeal Nasopharyngeal Swab     Status: None   Collection Time: 08/06/19  2:51 AM   Specimen: Nasopharyngeal Swab  Result Value Ref Range Status   SARS Coronavirus 2 NEGATIVE NEGATIVE Final    Comment: (NOTE) SARS-CoV-2 target nucleic acids are NOT DETECTED.  The SARS-CoV-2 RNA is generally detectable in upper and lower respiratory specimens during the acute phase of infection. The lowest concentration of SARS-CoV-2 viral copies this assay can detect is 250 copies / mL. A negative result does not preclude SARS-CoV-2 infection and should not be used as the sole basis for treatment or other patient management decisions.  A negative result may occur with improper specimen collection / handling, submission of specimen other than nasopharyngeal swab, presence of viral mutation(s) within the areas targeted by this assay, and inadequate number of viral copies (<250 copies / mL). A negative result must be combined with clinical observations, patient history, and epidemiological information.  Fact Sheet for Patients:     StrictlyIdeas.no  Fact Sheet for Healthcare Providers: BankingDealers.co.za  This test is not yet approved or  cleared by the [place] FDA and has been authorized for detection and/or diagnosis of SARS-CoV-2 by FDA under an Emergency Use Authorization (EUA).  This EUA will remain in effect (meaning this test can be used) for the duration of the COVID-19 declaration under Section 564(b)(1) of the Act, 21 U.S.C. section 360bbb-3(b)(1), unless the authorization is terminated or revoked sooner.  Performed at [facility], [place] [address], [place], [place] [address]          Radiology Studies: US RENAL  Result Date: 08/07/2019 CLINICAL DATA:  Pyelonephritis EXAM: RENAL /  URINARY TRACT ULTRASOUND COMPLETE COMPARISON:  None. FINDINGS: Right Kidney: Renal measurements: 9.5 x 4.5 x 3.4 cm = volume: 75 mL . Echogenicity within normal limits. No mass or hydronephrosis visualized. Left Kidney: Renal measurements: 8.7 x 5.6 x 3.6 cm = volume: 91 mL. Echogenicity within normal limits. No mass or hydronephrosis visualized. Bladder: Appears normal for degree of bladder distention. Other: None. IMPRESSION: No acute findings.  No hydronephrosis. Electronically Signed   By: Rolm Baptise M.D.   On: 08/07/2019 19:03        Scheduled Meds: . budesonide (PULMICORT) nebulizer solution  0.25 mg Nebulization BID  . enoxaparin (LOVENOX) injection  40 mg Subcutaneous Q24H  . levothyroxine  50 mcg Oral Q MTWThF  . levothyroxine  75 mcg Oral Once per day on Mon Thu   Continuous Infusions: . cefTRIAXone (ROCEPHIN)  IV 1 g (08/08/19 0515)     LOS: 2 days        Hosie Poisson, MD [facility]   To contact the attending provider between 7A-7P or the covering provider during after hours 7P-7A, please log into the web site [email or web address] and access using universal [place] password for that web site. If you do not have the password,  please call the hospital operator.  08/08/2019, 1:36 PM

## 2019-08-09 DIAGNOSIS — E871 Hypo-osmolality and hyponatremia: Secondary | ICD-10-CM

## 2019-08-09 LAB — CBC
HCT: 27.1 % — ABNORMAL LOW (ref 36.0–46.0)
Hemoglobin: 8.9 g/dL — ABNORMAL LOW (ref 12.0–15.0)
MCH: 31.6 pg (ref 26.0–34.0)
MCHC: 32.8 g/dL (ref 30.0–36.0)
MCV: 96.1 fL (ref 80.0–100.0)
Platelets: 344 10*3/uL (ref 150–400)
RBC: 2.82 MIL/uL — ABNORMAL LOW (ref 3.87–5.11)
RDW: 13.1 % (ref 11.5–15.5)
WBC: 8.2 10*3/uL (ref 4.0–10.5)
nRBC: 0 % (ref 0.0–0.2)

## 2019-08-09 LAB — BASIC METABOLIC PANEL
Anion gap: 8 (ref 5–15)
BUN: 17 mg/dL (ref 8–23)
CO2: 24 mmol/L (ref 22–32)
Calcium: 9.1 mg/dL (ref 8.9–10.3)
Chloride: 101 mmol/L (ref 98–111)
Creatinine, Ser: 0.55 mg/dL (ref 0.44–1.00)
GFR calc Af Amer: >60 mL/min (ref >60)
GFR calc non Af Amer: >60 mL/min (ref >60)
Glucose, Bld: 97 mg/dL (ref 70–99)
Potassium: 4.3 mmol/L (ref 3.5–5.1)
Sodium: 133 mmol/L — ABNORMAL LOW (ref 135–145)

## 2019-08-09 MED ORDER — CIPROFLOXACIN HCL 500 MG PO TABS
250.0000 mg | ORAL_TABLET | Freq: Two times a day (BID) | ORAL | 0 refills | Status: AC
Start: 2019-08-10 — End: 2019-08-12

## 2019-08-09 MED ORDER — SENNOSIDES-DOCUSATE SODIUM 8.6-50 MG PO TABS: 1.0000 | ORAL_TABLET | Freq: Every evening | ORAL | Status: DC | PRN

## 2019-08-09 NOTE — Care Management Important Message (Signed)
Important Message  Patient Details  Name: [name] MRN: [id number] Date of Birth: [birth year]   Medicare Important Message Given:  Yes     Orbie Pyo 08/09/2019, 12:34 PM

## 2019-08-09 NOTE — Plan of Care (Signed)
°  Problem: Activity: °Goal: Risk for activity intolerance will decrease °Outcome: Progressing °  °Problem: Elimination: °Goal: Will not experience complications related to urinary retention °Outcome: Progressing °  °Problem: Pain Managment: °Goal: General experience of comfort will improve °Outcome: Progressing °  °

## 2019-08-09 NOTE — Discharge Summary (Signed)
Physician Discharge Summary  [name]:[id number] DOB: 1934/06/29 DOA: 08/05/2019  PCP: Caren Macadam, MD  Admit date: 08/05/2019 Discharge date: 08/09/2019  Admitted From: ALF  Disposition: ALF  Recommendations for Outpatient Follow-up:  1. Follow up with PCP in 1-2 weeks 2. Please obtain BMP/CBC in one week   Discharge Condition: Stable.  CODE STATUS: FULL CODE.  Diet recommendation: Heart Healthy   Brief/Interim Summary:  85 year old lady prior history of hypothyroidism, iron deficiency anemia, acute diverticulitis, COPD presents with right flank pain dysuria and hematuria.  She was admitted for possible urinary tract infection. She completed 3 days of IV rocephin and discharged   Discharge Diagnoses:  Principal Problem:   UTI (urinary tract infection) Active Problems:   Hypothyroidism   Iron deficiency anemia   Hyponatremia   COPD (chronic obstructive pulmonary disease) (HCC)   Colitis   Urinary tract infection Urine cultures are growing 100,000 Citrobacter and sensitivities done.  Patient currently on IV Rocephin,  Day 3 , transition to oral antibiotics on discharge.  Pain control and PT evaluation ordered . US renal does not show any hydronephrosis or pyelonephritis.  Patient remains afbrile without any leukocytosis.   Hypothyroidism Continue with synthroid.    COPD No wheezing heard.  Resume albuterol as needed.   History of diverticulosis Denies any nausea vomiting or abdominal pain    History of iron deficiency anemia Hemoglobin stable between 9-10.   Hyponatremia:  Resolved.    Discharge Instructions  Discharge Instructions    Diet - low sodium heart healthy   Complete by: As directed    Discharge instructions   Complete by: As directed    Please follow up with PCP in one week.     Allergies as of 08/09/2019      Reactions   [place] Nut (berthollefia [place]) Skin Test Anaphylaxis   Fluconazole Shortness Of Breath    Metronidazole Shortness Of Breath, Nausea And Vomiting   Pepcid [famotidine] Shortness Of Breath, Other (See Comments)   Dizziness   Shellfish Allergy Anaphylaxis   Macrobid [nitrofurantoin]    REACTION: Syncope   Pantoprazole Other (See Comments)   CHEST PAIN      Medication List    TAKE these medications   acetaminophen 325 MG tablet Commonly known as: TYLENOL Take 650-975 mg by mouth daily.   ciprofloxacin 500 MG tablet Commonly known as: Cipro Take 0.5 tablets (250 mg total) by mouth 2 (two) times daily for 2 days. Start taking on: August 10, 2019   E-Z Spacer inhaler Use as instructed   Flovent HFA 110 MCG/ACT inhaler Generic drug: fluticasone INHALE 1 PUFF BY MOUTH TWICE A DAY What changed:   how much to take  how to take this  when to take this  additional instructions   Iron 325 (65 Fe) MG Tabs Take 1 tablet by mouth daily.   levothyroxine 75 MCG tablet Commonly known as: SYNTHROID Take by mouth 2 days per week What changed:   how much to take  how to take this  when to take this  additional instructions   levothyroxine 50 MCG tablet Commonly known as: SYNTHROID TAKE 1 TABLET BY MOUTH EVERY DAY What changed: when to take this   ProAir HFA 108 (90 Base) MCG/ACT inhaler Generic drug: albuterol INHALE 2 PUFFS INTO THE LUNGS EVERY 4 HOURS AS NEEDED FOR WHEEZING OR SHORTNESS OF BREATH What changed: See the new instructions.   senna-docusate 8.6-50 MG tablet Commonly known as: Senokot-S Take 1 tablet  by mouth at bedtime as needed for mild constipation.   VITAMIN D3 PO Take 1 tablet by mouth 2 (two) times a week. Saturday and Sunday       Allergies  Allergen Reactions  . [place] Nut (Berthollefia [place]) Skin Test Anaphylaxis  . Fluconazole Shortness Of Breath  . Metronidazole Shortness Of Breath and Nausea And Vomiting  . Pepcid [Famotidine] Shortness Of Breath and Other (See Comments)    Dizziness  . Shellfish Allergy Anaphylaxis  .  Macrobid [Nitrofurantoin]     REACTION: Syncope  . Pantoprazole Other (See Comments)    CHEST PAIN    Consultations:  None.    Procedures/Studies: CT Abdomen Pelvis W Contrast  Result Date: 07/26/2019 CLINICAL DATA:  Gastrointestinal bleeding 1 month prior. Prior partial colectomy. Surgical history including hysterectomy, appendectomy and cholecystectomy EXAM: CT ABDOMEN AND PELVIS WITH CONTRAST TECHNIQUE: Multidetector CT imaging of the abdomen and pelvis was performed using the standard protocol following bolus administration of intravenous contrast. CONTRAST:  136m OMNIPAQUE IOHEXOL 300 MG/ML  SOLN COMPARISON:  CT 06/04/2019 FINDINGS: Lower chest: Small nodule at the RIGHT lung base measures 4 mm. This nodules not changed from comparison CT 2017. Stable pleural base nodule at the same level is also unchanged Hepatobiliary: No focal hepatic lesion. Postcholecystectomy. No biliary dilatation. Pancreas: Pancreas is normal. No ductal dilatation. No pancreatic inflammation. Spleen: Normal spleen Adrenals/urinary tract: Adrenal glands and kidneys are normal. The ureters and bladder normal. Stomach/Bowel: Large sliding-type hiatal hernia. Half the stomach above the diaphragm. The small bowel is normal. There is a enteric colonic anastomosis in the anterior mid abdomen without complication (image 684/1. Normal ascending colon normal. Transverse colon is tortuous without abnormality . Descending colon normal. There are several diverticula and mild bowel wall thickening of the sigmoid colon. This may simply represent nondistention. No acute inflammation. Vascular/Lymphatic: Abdominal aorta is normal caliber with atherosclerotic calcification. There is no retroperitoneal or periportal lymphadenopathy. No pelvic lymphadenopathy. Reproductive: Post hysterectomy.  Adnexa unremarkable Other: No free fluid. Musculoskeletal: No aggressive osseous lesion. IMPRESSION: 1. No etiology for GI bleeding. 2. Mild sigmoid  diverticulosis without evidence acute diverticulitis. 3. Large sliding-type hiatal hernia. 4. Aortic Atherosclerosis (ICD10-I70.0). Electronically Signed   By: SSuzy BouchardM.D.   On: 07/26/2019 09:52   [place]RENAL  Result Date: 08/07/2019 CLINICAL DATA:  Pyelonephritis EXAM: RENAL / URINARY TRACT ULTRASOUND COMPLETE COMPARISON:  None. FINDINGS: Right Kidney: Renal measurements: 9.5 x 4.5 x 3.4 cm = volume: 75 mL . Echogenicity within normal limits. No mass or hydronephrosis visualized. Left Kidney: Renal measurements: 8.7 x 5.6 x 3.6 cm = volume: 91 mL. Echogenicity within normal limits. No mass or hydronephrosis visualized. Bladder: Appears normal for degree of bladder distention. Other: None. IMPRESSION: No acute findings.  No hydronephrosis. Electronically Signed   By: KRolm BaptiseM.D.   On: 08/07/2019 19:03   CT RENAL STONE STUDY  Result Date: 08/06/2019 CLINICAL DATA:  Right flank and inguinal pain, dysuria EXAM: CT ABDOMEN AND PELVIS WITHOUT CONTRAST TECHNIQUE: Multidetector CT imaging of the abdomen and pelvis was performed following the standard protocol without IV contrast. COMPARISON:  07/25/2019 FINDINGS: Lower chest: No acute pleural or parenchymal lung disease. Moderate hiatal hernia unchanged. Hepatobiliary: No focal liver abnormality is seen. Status post cholecystectomy. No biliary dilatation. Pancreas: Unremarkable. No pancreatic ductal dilatation or surrounding inflammatory changes. Spleen: Normal in size without focal abnormality. Adrenals/Urinary Tract: No urinary tract calculi or obstructive uropathy within either kidney. Bladder is unremarkable. The adrenals are normal. Stomach/Bowel:  Segmental wall thickening of the descending and sigmoid colon again noted, unchanged since prior exam and consistent with colitis. No bowel obstruction or ileus. Bowel resection and ileocolic anastomosis seen within the left lower quadrant, unchanged. Vascular/Lymphatic: Aortic atherosclerosis. No  enlarged abdominal or pelvic lymph nodes. Reproductive: Status post hysterectomy. No adnexal masses. Other: No abdominal wall hernia or abnormality. No abdominopelvic ascites. Musculoskeletal: No acute or destructive bony lesions. Reconstructed images demonstrate no additional findings. IMPRESSION: 1. Stable colitis involving the descending and sigmoid colon. 2. No urinary tract calculi or obstructive uropathy within either kidney. 3. Moderate hiatal hernia. 4. Aortic Atherosclerosis (ICD10-I70.0). Electronically Signed   By: Randa Ngo M.D.   On: 08/06/2019 02:07   DG HIP UNILAT WITH PELVIS 2-3 VIEWS LEFT  Result Date: 08/06/2019 CLINICAL DATA:  Hip pain, left side greater right. EXAM: DG HIP (WITH OR WITHOUT PELVIS) 2-3V LEFT COMPARISON:  CT 07/25/2019. FINDINGS: Diffuse osteopenia. Degenerative changes lumbar spine and both hips. Degenerative changes most prominent about the right hip. No acute bony abnormality identified. No evidence of fracture or dislocation. Peripheral vascular calcification. IMPRESSION: 1. Diffuse osteopenia. Diffuse degenerative change lumbar spine and both hips. Degenerative changes are particularly severe about the right hip. No acute abnormality identified. Left hip is intact. 2.  Peripheral vascular disease. Electronically Signed   By: Marcello Moores  Register   On: 08/06/2019 12:45   DG HIP UNILAT WITH PELVIS 2-3 VIEWS RIGHT  Result Date: 08/06/2019 CLINICAL DATA:  Right hip pain EXAM: DG HIP (WITH OR WITHOUT PELVIS) 2V RIGHT COMPARISON:  None. FINDINGS: Degenerative changes of the right hip joint are noted with loss of the superior joint space with bone-on-bone contact and increased sclerosis identified. No acute fracture or dislocation is noted. Mild remodeling of the femoral head is seen. IMPRESSION: Degenerative change without acute abnormality. Electronically Signed   By: Inez Catalina M.D.   On: 08/06/2019 12:44       Subjective: No chest pain or sob.   Discharge  Exam: Vitals:   08/08/19 2014 08/09/19 0451  BP: (!) 147/73 130/71  Pulse: 81 72  Resp: 17 17  Temp: 97.8 F (36.6 C) 98.3 F (36.8 C)  SpO2: 99% 98%   Vitals:   08/08/19 0757 08/08/19 1258 08/08/19 2014 08/09/19 0451  BP:  131/68 (!) 147/73 130/71  Pulse: 72 78 81 72  Resp: 16 18 17 17   Temp:  97.8 F (36.6 C) 97.8 F (36.6 C) 98.3 F (36.8 C)  TempSrc:  Oral Oral Oral  SpO2: 96% 99% 99% 98%  Weight:      Height:        General: Pt is alert, awake, not in acute distress Cardiovascular: RRR, S1/S2 +, no rubs, no gallops Respiratory: CTA bilaterally, no wheezing, no rhonchi Abdominal: Soft, NT, ND, bowel sounds + Extremities: no edema, no cyanosis    The results of significant diagnostics from this hospitalization (including imaging, microbiology, ancillary and laboratory) are listed below for reference.     Microbiology: Recent Results (from the past 240 hour(s))  Urine Culture     Status: Abnormal   Collection Time: 08/06/19  1:22 AM   Specimen: Urine, Random  Result Value Ref Range Status   Specimen Description URINE, RANDOM  Final   Special Requests   Final    NONE Performed at [facility], [address], [place], [place] [address]    Culture >=100,000 COLONIES/mL CITROBACTER BRAAKII (A)  Final   Report Status 08/08/2019 FINAL  Final  Organism ID, Bacteria CITROBACTER BRAAKII (A)  Final      Susceptibility   Citrobacter braakii - MIC*    CEFAZOLIN >=64 RESISTANT Resistant     CEFTRIAXONE <=0.25 SENSITIVE Sensitive     CIPROFLOXACIN <=0.25 SENSITIVE Sensitive     GENTAMICIN <=1 SENSITIVE Sensitive     IMIPENEM 0.5 SENSITIVE Sensitive     NITROFURANTOIN <=16 SENSITIVE Sensitive     TRIMETH/SULFA <=20 SENSITIVE Sensitive     PIP/TAZO <=4 SENSITIVE Sensitive     * >=100,000 COLONIES/mL CITROBACTER BRAAKII  SARS Coronavirus 2 by RT PCR (hospital order, performed in [place] hospital lab) Nasopharyngeal Nasopharyngeal Swab     Status: None    Collection Time: 08/06/19  2:51 AM   Specimen: Nasopharyngeal Swab  Result Value Ref Range Status   SARS Coronavirus 2 NEGATIVE NEGATIVE Final    Comment: (NOTE) SARS-CoV-2 target nucleic acids are NOT DETECTED.  The SARS-CoV-2 RNA is generally detectable in upper and lower respiratory specimens during the acute phase of infection. The lowest concentration of SARS-CoV-2 viral copies this assay can detect is 250 copies / mL. A negative result does not preclude SARS-CoV-2 infection and should not be used as the sole basis for treatment or other patient management decisions.  A negative result may occur with improper specimen collection / handling, submission of specimen other than nasopharyngeal swab, presence of viral mutation(s) within the areas targeted by this assay, and inadequate number of viral copies (<250 copies / mL). A negative result must be combined with clinical observations, patient history, and epidemiological information.  Fact Sheet for Patients:   StrictlyIdeas.no  Fact Sheet for Healthcare Providers: BankingDealers.co.za  This test is not yet approved or  cleared by the [place] FDA and has been authorized for detection and/or diagnosis of SARS-CoV-2 by FDA under an Emergency Use Authorization (EUA).  This EUA will remain in effect (meaning this test can be used) for the duration of the COVID-19 declaration under Section 564(b)(1) of the Act, 21 U.S.C. section 360bbb-3(b)(1), unless the authorization is terminated or revoked sooner.  Performed at [facility], [place] [address] [place], [place] [address]      Labs: BNP (last 3 results) No results for input(s): BNP in the last 8760 hours. Basic Metabolic Panel: Recent Labs  Lab 08/05/19 1456 08/07/19 0227 08/08/19 0122 08/09/19 0148  NA 132* 135 135 133*  K 4.5 4.3 4.3 4.3  CL 97* 102 104 101  CO2 27 26 24 24   GLUCOSE 104* 105* 96 97  BUN 15  29* 24* 17  CREATININE 0.62 0.54 0.54 0.55  CALCIUM 9.9 9.1 9.0 9.1   Liver Function Tests: No results for input(s): AST, ALT, ALKPHOS, BILITOT, PROT, ALBUMIN in the last 168 hours. No results for input(s): LIPASE, AMYLASE in the last 168 hours. No results for input(s): AMMONIA in the last 168 hours. CBC: Recent Labs  Lab 08/05/19 1456 08/07/19 0227 08/08/19 0122 08/09/19 0148  WBC 7.8 8.9 8.5 8.2  HGB 10.8* 9.3* 8.9* 8.9*  HCT 33.7* 29.3* 27.3* 27.1*  MCV 96.0 96.4 95.5 96.1  PLT 350 352 314 344   Cardiac Enzymes: No results for input(s): CKTOTAL, CKMB, CKMBINDEX, TROPONINI in the last 168 hours. BNP: Invalid input(s): POCBNP CBG: No results for input(s): GLUCAP in the last 168 hours. D-Dimer No results for input(s): DDIMER in the last 72 hours. Hgb A1c No results for input(s): HGBA1C in the last 72 hours. Lipid Profile No results for input(s): CHOL, HDL,  LDLCALC, TRIG, CHOLHDL, LDLDIRECT in the last 72 hours. Thyroid function studies No results for input(s): TSH, T4TOTAL, T3FREE, THYROIDAB in the last 72 hours.  Invalid input(s): FREET3 Anemia work up No results for input(s): VITAMINB12, FOLATE, FERRITIN, TIBC, IRON, RETICCTPCT in the last 72 hours. Urinalysis    Component Value Date/Time   COLORURINE YELLOW 08/06/2019 0122   APPEARANCEUR TURBID (A) 08/06/2019 0122   LABSPEC 1.012 08/06/2019 0122   PHURINE 6.0 08/06/2019 0122   GLUCOSEU NEGATIVE 08/06/2019 0122   GLUCOSEU NEGATIVE 11/21/2016 1510   HGBUR SMALL (A) 08/06/2019 0122   BILIRUBINUR NEGATIVE 08/06/2019 0122   BILIRUBINUR n 09/17/2018 1601   KETONESUR NEGATIVE 08/06/2019 0122   PROTEINUR 100 (A) 08/06/2019 0122   UROBILINOGEN 0.2 09/17/2018 1601   UROBILINOGEN 0.2 06/26/2017 1546   NITRITE POSITIVE (A) 08/06/2019 0122   LEUKOCYTESUR LARGE (A) 08/06/2019 0122   Sepsis Labs Invalid input(s): PROCALCITONIN,  WBC,  LACTICIDVEN Microbiology Recent Results (from the past 240 hour(s))  Urine Culture      Status: Abnormal   Collection Time: 08/06/19  1:22 AM   Specimen: Urine, Random  Result Value Ref Range Status   Specimen Description URINE, RANDOM  Final   Special Requests   Final    NONE Performed at [facility], [place] [address], [place], [place] [address]    Culture >=100,000 COLONIES/mL CITROBACTER BRAAKII (A)  Final   Report Status 08/08/2019 FINAL  Final   Organism ID, Bacteria CITROBACTER BRAAKII (A)  Final      Susceptibility   Citrobacter braakii - MIC*    CEFAZOLIN >=64 RESISTANT Resistant     CEFTRIAXONE <=0.25 SENSITIVE Sensitive     CIPROFLOXACIN <=0.25 SENSITIVE Sensitive     GENTAMICIN <=1 SENSITIVE Sensitive     IMIPENEM 0.5 SENSITIVE Sensitive     NITROFURANTOIN <=16 SENSITIVE Sensitive     TRIMETH/SULFA <=20 SENSITIVE Sensitive     PIP/TAZO <=4 SENSITIVE Sensitive     * >=100,000 COLONIES/mL CITROBACTER BRAAKII  SARS Coronavirus 2 by RT PCR (hospital order, performed in [place] hospital lab) Nasopharyngeal Nasopharyngeal Swab     Status: None   Collection Time: 08/06/19  2:51 AM   Specimen: Nasopharyngeal Swab  Result Value Ref Range Status   SARS Coronavirus 2 NEGATIVE NEGATIVE Final    Comment: (NOTE) SARS-CoV-2 target nucleic acids are NOT DETECTED.  The SARS-CoV-2 RNA is generally detectable in upper and lower respiratory specimens during the acute phase of infection. The lowest concentration of SARS-CoV-2 viral copies this assay can detect is 250 copies / mL. A negative result does not preclude SARS-CoV-2 infection and should not be used as the sole basis for treatment or other patient management decisions.  A negative result may occur with improper specimen collection / handling, submission of specimen other than nasopharyngeal swab, presence of viral mutation(s) within the areas targeted by this assay, and inadequate number of viral copies (<250 copies / mL). A negative result must be combined with clinical observations, patient history,  and epidemiological information.  Fact Sheet for Patients:   StrictlyIdeas.no  Fact Sheet for Healthcare Providers: BankingDealers.co.za  This test is not yet approved or  cleared by the [place] FDA and has been authorized for detection and/or diagnosis of SARS-CoV-2 by FDA under an Emergency Use Authorization (EUA).  This EUA will remain in effect (meaning this test can be used) for the duration of the COVID-19 declaration under Section 564(b)(1) of the Act, 21 U.S.C. section 360bbb-3(b)(1), unless the authorization  is terminated or revoked sooner.  Performed at [facility], [place] [address] [place], [place] [address]      Time coordinating discharge: 35 minutes SIGNED:   Hosie Poisson, MD  [facility] 08/09/2019, 9:51 AM

## 2019-09-11 ENCOUNTER — Telehealth: Payer: Self-pay | Admitting: Family Medicine

## 2019-09-11 NOTE — Telephone Encounter (Signed)
Patient wants [facility] to call in a Rx for UTI or a bladder infection.  She was just seen 08/05/2019 for a UTI and she has liver and bladder diease.  [id number] [place]  [phone] Fax:  [phone]

## 2019-09-11 NOTE — Telephone Encounter (Signed)
She needs evaluation. She was admitted for UTI and was sensitive to the IV antibiotic given in hospital (adequate 3 doses) and given antibiotics after discharge. If symptoms are still present, she needs to be seen and needs a repeat urine culture.

## 2019-09-11 NOTE — Telephone Encounter (Signed)
Spoke with the patient and informed her of the message below a number of times.  Appt scheduled with Dr Sarajane Jews for tomorrow to arrive at 10:15am.

## 2019-09-12 ENCOUNTER — Other Ambulatory Visit: Payer: Self-pay

## 2019-09-12 ENCOUNTER — Ambulatory Visit (INDEPENDENT_AMBULATORY_CARE_PROVIDER_SITE_OTHER): Payer: Medicare Other | Admitting: Family Medicine

## 2019-09-12 ENCOUNTER — Encounter: Payer: Self-pay | Admitting: Family Medicine

## 2019-09-12 VITALS — BP 132/64 | HR 110 | Temp 98.2°F | Wt 142.6 lb

## 2019-09-12 DIAGNOSIS — N390 Urinary tract infection, site not specified: Secondary | ICD-10-CM | POA: Diagnosis not present

## 2019-09-12 DIAGNOSIS — R300 Dysuria: Secondary | ICD-10-CM | POA: Diagnosis not present

## 2019-09-12 LAB — POCT URINALYSIS DIPSTICK
Bilirubin, UA: NEGATIVE
Blood, UA: NEGATIVE
Glucose, UA: NEGATIVE
Ketones, UA: NEGATIVE
Nitrite, UA: NEGATIVE
Protein, UA: NEGATIVE
Spec Grav, UA: 1.015 (ref 1.010–1.025)
Urobilinogen, UA: 0.2 E.U./dL
pH, UA: 6.5 (ref 5.0–8.0)

## 2019-09-12 MED ORDER — CIPROFLOXACIN HCL 500 MG PO TABS
500.0000 mg | ORAL_TABLET | Freq: Two times a day (BID) | ORAL | 0 refills | Status: DC
Start: 2019-09-12 — End: 2019-10-31

## 2019-09-12 NOTE — Progress Notes (Signed)
   Subjective:    Patient ID: [name], female    DOB: 01/05/1935, 85 y.o.   MRN: [id number]  HPI Here for a recurrent UTI. She has had 2 days of urinary urgency, burning, and a dark color to the urine. No fever or back pain or nausea. She drinks plenty of water every day. Of note she was in the hospital from 08-05-19 to 08-09-19 for a Citrobacter UTI, and she was treated with Cipro. After that she felt fine until yesterday.    Review of Systems  Constitutional: Negative.   Respiratory: Negative.   Cardiovascular: Negative.   Gastrointestinal: Negative.   Genitourinary: Positive for dysuria, frequency and urgency. Negative for flank pain and hematuria.       Objective:   Physical Exam Constitutional:      Appearance: Normal appearance. She is not ill-appearing.  Cardiovascular:     Rate and Rhythm: Normal rate and regular rhythm.     Pulses: Normal pulses.     Heart sounds: Normal heart sounds.  Pulmonary:     Effort: Pulmonary effort is normal.     Breath sounds: Normal breath sounds.  Abdominal:     General: Abdomen is flat. Bowel sounds are normal. There is no distension.     Palpations: Abdomen is soft. There is no mass.     Tenderness: There is no abdominal tenderness. There is no right CVA tenderness, left CVA tenderness, guarding or rebound.     Hernia: No hernia is present.  Neurological:     Mental Status: She is alert.           Assessment & Plan:  Recurrent UTI. Treat with Cipro. Culture the sample.  Alysia Penna, MD

## 2019-09-14 LAB — URINE CULTURE
MICRO NUMBER:: 10791658
SPECIMEN QUALITY:: ADEQUATE

## 2019-10-02 ENCOUNTER — Other Ambulatory Visit: Payer: Self-pay | Admitting: Family Medicine

## 2019-10-29 ENCOUNTER — Telehealth: Payer: Self-pay | Admitting: Family Medicine

## 2019-10-29 ENCOUNTER — Ambulatory Visit: Payer: Medicare Other | Admitting: Internal Medicine

## 2019-10-29 ENCOUNTER — Telehealth: Payer: Self-pay | Admitting: Nurse Practitioner

## 2019-10-29 NOTE — Telephone Encounter (Signed)
Spoke with the [name] and informed her Dr Ethlyn Gallery is out of the office.  I offered a visit to come in to the office today for an appt with Dr Jerilee Hoh at 2:30pm due to a questionable recurrent UTI and I offered a virtual as [name] declined due to weather.  Patient stated she is now having bleeding from her colon, has had problems with this a few times, the doctor is aware and the last time she saw Dr Sarajane Jews he gave her Cipro and this helped with this also.  I advised the [name] contact her GI doctor as she was last seen by their office in May according to the chart and this cannot be evaluated via a virtual or phone visit and she agreed to call the office.

## 2019-10-29 NOTE — Telephone Encounter (Signed)
Pt is calling to see if she can get a refill on   ciprofloxacin (CIPRO) 500 MG tablet   She is having the same issues prior to being prescribed the medication and it worked for her.  Please advise

## 2019-10-29 NOTE — Telephone Encounter (Signed)
Pt is requesting a call back from a nurse to discuss a medication refill (she does not remember name of medication)

## 2019-10-29 NOTE — Telephone Encounter (Signed)
Spoke with the patient. She has had 2 days of seeing blood on the tissue after a bowel movement. No blood in the stool noted. States it is a small amount. Denies fever, nausea, constipation or black stools. She admits to some discomfort with sitting for long periods of time which she says she has to do a lot. She is concerned about the blood and asks for your guidance on this.

## 2019-10-30 NOTE — Telephone Encounter (Signed)
Called the patient back. Today she has low abdominal discomfort and tenderness if she pushes on it. She is seeing mucous and "pus" with the bowel movements. She thinks she has an "infection of (her) intestines like last time." She does not feel well today. She is asking for antibiotics. States she is unable to go to the ED for evaluation. Please advise.

## 2019-10-30 NOTE — Telephone Encounter (Signed)
Patient is calling to follow up and states she is passing more blood and is need of advise.

## 2019-10-30 NOTE — Telephone Encounter (Signed)
Patient called to follow up on previous message and states she is feeling worst

## 2019-10-31 ENCOUNTER — Encounter: Payer: Self-pay | Admitting: Gastroenterology

## 2019-10-31 ENCOUNTER — Ambulatory Visit (INDEPENDENT_AMBULATORY_CARE_PROVIDER_SITE_OTHER): Payer: Medicare Other | Admitting: Gastroenterology

## 2019-10-31 VITALS — BP 130/70 | HR 70 | Ht 65.0 in | Wt 143.0 lb

## 2019-10-31 DIAGNOSIS — K5792 Diverticulitis of intestine, part unspecified, without perforation or abscess without bleeding: Secondary | ICD-10-CM | POA: Insufficient documentation

## 2019-10-31 DIAGNOSIS — R1032 Left lower quadrant pain: Secondary | ICD-10-CM | POA: Insufficient documentation

## 2019-10-31 MED ORDER — AMOXICILLIN-POT CLAVULANATE 875-125 MG PO TABS: 1.0000 | ORAL_TABLET | Freq: Two times a day (BID) | ORAL | 0 refills | Status: AC

## 2019-10-31 NOTE — Progress Notes (Signed)
10/31/2019 [name] [id number] 1935/01/17   HISTORY OF PRESENT ILLNESS: This is an 85 year old female who is a patient of [name].  She saw Margaret Savoy, NP, here in our office on a couple of occasions in May of this year.  Please see notes from Jun 18, 2019 and Jul 03, 2019 for further details.  Nonetheless, she had a CT scan of the abdomen in April that suggested diverticulitis.  Was treated with Augmentin.  That course had to be extended, but her symptoms then ultimately resolved.  She was also being treated for rectal bleeding that was thought to be due to an inflamed internal hemorrhoids seen on anoscopy at one of her visits.  At her last visit here on May 26 with [name] she was doing much better and all of her symptoms have resolved.  She had a follow-up CT scan of the abdomen and pelvis with contrast that showed the following:  IMPRESSION: 1. No etiology for GI bleeding. 2. Mild sigmoid diverticulosis without evidence acute diverticulitis. 3. Large sliding-type hiatal hernia. 4. Aortic Atherosclerosis (ICD10-I70.0).  She is here today with recurrent complaints of left lower quadrant abdominal pain.  She says that the pain really just began again over the past 3 days or so and is most painful today than it had been.  She describes it mostly in the left lower quadrant and radiates towards the mid lower abdomen as well.  Says that she has also been seeing some bright red blood and mucus when she wipes for the past 2 days.  She thinks that it could be another episode of diverticulitis and was hoping that she could call in here and get some antibiotics, but they recommended an appointment instead.  Past Medical History:  Diagnosis Date  . ABDOMINAL PAIN, CHRONIC 05/28/2008  . ALLERGIC RHINITIS 09/21/2006  . ANEMIA-NOS 09/21/2006  . ASTHMA 09/21/2006  . ASYMPTOMATIC POSTMENOPAUSAL STATUS 05/28/2008  . Cataract   . CHEST PAIN, ATYPICAL 05/28/2008  . COPD (chronic obstructive  pulmonary disease) ([place])   . DDD (degenerative disc disease)   . DIVERTICULITIS, HX OF 09/21/2006  . GERD 09/21/2006  . HYPERCHOLESTEROLEMIA 06/05/2009  . HYPERTENSION 09/21/2006  . HYPOTHYROIDISM 09/21/2006  . Peptic ulcer disease   . Purpura ([place])   . RENAL INSUFFICIENCY 09/21/2006  . Shingles   . Small bowel obstruction ([place])   . Somatization disorder 04/25/2007  . UNSPECIFIED URINARY CALCULUS 05/28/2008  . UTI (urinary tract infection) 07/2019   Past Surgical History:  Procedure Laterality Date  . ABDOMINAL HYSTERECTOMY  1981  . acute Nephritis  1958  . APPENDECTOMY  1959  . CESAREAN SECTION    . CHOLECYSTECTOMY  1983  . COLON SURGERY    . DENTAL SURGERY  1970  . NASAL SINUS SURGERY  1967  . Pulmonary thrombosis    . TONSILLECTOMY    . TUBAL LIGATION      reports that she has never smoked. She has never used smokeless tobacco. She reports that she does not drink alcohol and does not use drugs. family history includes Allergic rhinitis in her sister; Arthritis in her sister; Bladder Cancer in her father; Cirrhosis in her son; Hemochromatosis in her cousin and son; Hypertension in her father; Marfan syndrome in her son; Other in her brother and father; Pancreatic cancer in her mother; Polycystic kidney disease in her mother. Allergies  Allergen Reactions  . [name] (Berthollefia [place]) Skin Test Anaphylaxis  . Fluconazole Shortness Of Breath  .  Metronidazole Shortness Of Breath and Nausea And Vomiting  . Pepcid [Famotidine] Shortness Of Breath and Other (See Comments)    Dizziness  . Shellfish Allergy Anaphylaxis  . Macrobid [Nitrofurantoin]     REACTION: Syncope  . Pantoprazole Other (See Comments)    CHEST PAIN      Outpatient Encounter Medications as of 10/31/2019  Medication Sig  . acetaminophen (TYLENOL) 325 MG tablet Take 650-975 mg by mouth daily.   . Cholecalciferol (VITAMIN D3 PO) Take 1 tablet by mouth 2 (two) times a week. Saturday and Sunday  . Ferrous  Sulfate (IRON) 325 (65 FE) MG TABS Take 1 tablet by mouth daily.   . fluticasone (FLOVENT HFA) 110 MCG/ACT inhaler TAKE 1 PUFF BY MOUTH TWICE A DAY  . levothyroxine (SYNTHROID) 50 MCG tablet TAKE 1 TABLET BY MOUTH EVERY DAY (Patient taking differently: Take 50 mcg by mouth every Monday, Tuesday, Wednesday, Thursday, and Friday. )  . levothyroxine (SYNTHROID) 75 MCG tablet Take by mouth 2 days per week (Patient taking differently: Take 75 mcg by mouth 2 (two) times a week. Take on Saturday and Sunday)  . PROAIR HFA 108 (90 Base) MCG/ACT inhaler INHALE 2 PUFFS INTO THE LUNGS EVERY 4 HOURS AS NEEDED FOR WHEEZING OR SHORTNESS OF BREATH (Patient taking differently: Inhale 2 puffs into the lungs every 4 (four) hours as needed for wheezing or shortness of breath. )  . senna-docusate (SENOKOT-S) 8.6-50 MG tablet Take 1 tablet by mouth at bedtime as needed for mild constipation.  Marland Kitchen Spacer/Aero-Holding Chambers (E-Z SPACER) inhaler Use as instructed  . amoxicillin-clavulanate (AUGMENTIN) 875-125 MG tablet Take 1 tablet by mouth 2 (two) times daily for 10 days.  . [DISCONTINUED] ciprofloxacin (CIPRO) 500 MG tablet Take 1 tablet (500 mg total) by mouth 2 (two) times daily.   No facility-administered encounter medications on file as of 10/31/2019.     REVIEW OF SYSTEMS  : All other systems reviewed and negative except where noted in the History of Present Illness.   PHYSICAL EXAM: BP 130/70   Pulse 70   Ht 5' 5"  (1.651 m)   Wt 143 lb (64.9 kg)   BMI 23.80 kg/m  General: Well developed white female in no acute distress Head: Normocephalic and atraumatic Eyes:  Sclerae anicteric, conjunctiva pink. Ears: Normal auditory acuity Lungs: Clear throughout to auscultation; no W/R/R. Heart: Regular rate and rhythm; no M/R/G. Abdomen: Soft, non-distended.  BS present.  LLQ TTP. Musculoskeletal: Symmetrical with no gross deformities  Skin: No lesions on visible extremities Extremities: No edema    Neurological: Alert oriented x 4, grossly non-focal Cervical Nodes:  No significant cervical adenopathy Psychological:  Alert and cooperative. Normal mood and affect  ASSESSMENT AND PLAN: *LLQ abdominal pain:  Was treated for diverticulitis earlier this year as seen on CT scan.  Responded to a somewhat prolonged course of Augmentin.  Started with pain again over the past few days.  She thinks that this may be diverticulitis again and was hoping that maybe she could get another course of antibiotics.  Will treat with Augmentin 875 mg BID x 10 days.  Prescription sent to pharmacy.  She will call back early next week with an update on her symptoms.   CC:  Caren Macadam, MD

## 2019-10-31 NOTE — Patient Instructions (Addendum)
If you are age 83 or older, your body mass index should be between 23-30. Your Body mass index is 23.8 kg/m. If this is out of the aforementioned range listed, please consider follow up with your Primary Care Provider.  If you are age 94 or younger, your body mass index should be between 19-25. Your Body mass index is 23.8 kg/m. If this is out of the aformentioned range listed, please consider follow up with your Primary Care Provider.   START Augmentin 1 tablet twice daily. This has been sent to you pharmacy.  Call back with an update early next week.  Ask for Beth or Patty P.

## 2019-11-03 ENCOUNTER — Other Ambulatory Visit: Payer: Self-pay | Admitting: Family Medicine

## 2019-11-05 NOTE — Telephone Encounter (Signed)
Given follow up appt

## 2019-11-06 NOTE — Telephone Encounter (Signed)
The pt is calling with an update on her symptoms per Jessica's request from last office visit.  The pt had a large BM with some BRBPR.  She says she has no pain, but does feel fatigued from eating a liquid diet.  She has 2 days left of Augmentin.

## 2019-11-06 NOTE — Telephone Encounter (Signed)
She should add soft food into her diet.  Grits, oatmeal, mac and cheese, mashed potatoes, eggs, baked chicken, etc.  I am glad that she is not having pain.  Have her call back again early next week to make sure she is doing ok after she increases her diet.  Thank you,  Jess

## 2019-11-06 NOTE — Telephone Encounter (Signed)
Thank you Patty

## 2019-11-06 NOTE — Telephone Encounter (Signed)
The pt has been advised and will call next week with an update after adding soft foods

## 2019-11-06 NOTE — Telephone Encounter (Signed)
Patient called wanting to follow up on progress since she was given antibiotics

## 2019-11-08 NOTE — Progress Notes (Signed)
Reviewed and agree with documentation and assessment and plan. K. Veena Leann Mayweather , MD   

## 2019-11-11 NOTE — Telephone Encounter (Signed)
The pt did not start on soft foods as requested.  She will begin that today and call back in a few days with an update.  Her bleeding is still small amount of BRB on the stool when she has BM. She has been advised to call if the bleeding worsens.

## 2019-11-11 NOTE — Telephone Encounter (Signed)
Pt is calling with an update  Pt is experiencing blood from the rectum this morning, pt would like a call back.

## 2019-11-16 ENCOUNTER — Emergency Department (HOSPITAL_COMMUNITY): Payer: Medicare Other

## 2019-11-16 ENCOUNTER — Other Ambulatory Visit: Payer: Self-pay

## 2019-11-16 ENCOUNTER — Inpatient Hospital Stay (HOSPITAL_COMMUNITY)
Admission: EM | Admit: 2019-11-16 | Discharge: 2019-11-20 | DRG: 387 | Disposition: A | Discharge status: Home Health | Payer: Medicare Other | Attending: Internal Medicine | Admitting: Internal Medicine

## 2019-11-16 ENCOUNTER — Encounter (HOSPITAL_COMMUNITY): Payer: Self-pay | Admitting: Emergency Medicine

## 2019-11-16 DIAGNOSIS — K5120 Ulcerative (chronic) proctitis without complications: Secondary | ICD-10-CM | POA: Diagnosis not present

## 2019-11-16 DIAGNOSIS — K449 Diaphragmatic hernia without obstruction or gangrene: Secondary | ICD-10-CM | POA: Diagnosis not present

## 2019-11-16 DIAGNOSIS — Z8711 Personal history of peptic ulcer disease: Secondary | ICD-10-CM

## 2019-11-16 DIAGNOSIS — Z9049 Acquired absence of other specified parts of digestive tract: Secondary | ICD-10-CM

## 2019-11-16 DIAGNOSIS — Z66 Do not resuscitate: Secondary | ICD-10-CM | POA: Diagnosis present

## 2019-11-16 DIAGNOSIS — I10 Essential (primary) hypertension: Secondary | ICD-10-CM | POA: Diagnosis present

## 2019-11-16 DIAGNOSIS — Z8279 Family history of other congenital malformations, deformations and chromosomal abnormalities: Secondary | ICD-10-CM

## 2019-11-16 DIAGNOSIS — Z79899 Other long term (current) drug therapy: Secondary | ICD-10-CM | POA: Diagnosis not present

## 2019-11-16 DIAGNOSIS — K6389 Other specified diseases of intestine: Secondary | ICD-10-CM | POA: Diagnosis not present

## 2019-11-16 DIAGNOSIS — Z8261 Family history of arthritis: Secondary | ICD-10-CM

## 2019-11-16 DIAGNOSIS — D509 Iron deficiency anemia, unspecified: Secondary | ICD-10-CM

## 2019-11-16 DIAGNOSIS — R531 Weakness: Secondary | ICD-10-CM | POA: Diagnosis present

## 2019-11-16 DIAGNOSIS — K625 Hemorrhage of anus and rectum: Secondary | ICD-10-CM

## 2019-11-16 DIAGNOSIS — E785 Hyperlipidemia, unspecified: Secondary | ICD-10-CM | POA: Diagnosis not present

## 2019-11-16 DIAGNOSIS — Z20822 Contact with and (suspected) exposure to covid-19: Secondary | ICD-10-CM | POA: Diagnosis present

## 2019-11-16 DIAGNOSIS — K922 Gastrointestinal hemorrhage, unspecified: Secondary | ICD-10-CM | POA: Diagnosis present

## 2019-11-16 DIAGNOSIS — Z881 Allergy status to other antibiotic agents status: Secondary | ICD-10-CM

## 2019-11-16 DIAGNOSIS — K6289 Other specified diseases of anus and rectum: Secondary | ICD-10-CM | POA: Diagnosis not present

## 2019-11-16 DIAGNOSIS — J449 Chronic obstructive pulmonary disease, unspecified: Secondary | ICD-10-CM | POA: Diagnosis not present

## 2019-11-16 DIAGNOSIS — R933 Abnormal findings on diagnostic imaging of other parts of digestive tract: Secondary | ICD-10-CM | POA: Diagnosis not present

## 2019-11-16 DIAGNOSIS — Z8249 Family history of ischemic heart disease and other diseases of the circulatory system: Secondary | ICD-10-CM

## 2019-11-16 DIAGNOSIS — E7800 Pure hypercholesterolemia, unspecified: Secondary | ICD-10-CM | POA: Diagnosis present

## 2019-11-16 DIAGNOSIS — K219 Gastro-esophageal reflux disease without esophagitis: Secondary | ICD-10-CM | POA: Diagnosis not present

## 2019-11-16 DIAGNOSIS — D500 Iron deficiency anemia secondary to blood loss (chronic): Secondary | ICD-10-CM | POA: Diagnosis not present

## 2019-11-16 DIAGNOSIS — E039 Hypothyroidism, unspecified: Secondary | ICD-10-CM | POA: Diagnosis present

## 2019-11-16 DIAGNOSIS — K529 Noninfective gastroenteritis and colitis, unspecified: Secondary | ICD-10-CM | POA: Diagnosis not present

## 2019-11-16 DIAGNOSIS — Z87440 Personal history of urinary (tract) infections: Secondary | ICD-10-CM

## 2019-11-16 DIAGNOSIS — Z79890 Hormone replacement therapy (postmenopausal): Secondary | ICD-10-CM | POA: Diagnosis not present

## 2019-11-16 DIAGNOSIS — Z888 Allergy status to other drugs, medicaments and biological substances status: Secondary | ICD-10-CM | POA: Diagnosis not present

## 2019-11-16 DIAGNOSIS — K5730 Diverticulosis of large intestine without perforation or abscess without bleeding: Secondary | ICD-10-CM | POA: Diagnosis present

## 2019-11-16 DIAGNOSIS — Z8052 Family history of malignant neoplasm of bladder: Secondary | ICD-10-CM | POA: Diagnosis not present

## 2019-11-16 DIAGNOSIS — K3184 Gastroparesis: Secondary | ICD-10-CM | POA: Diagnosis not present

## 2019-11-16 DIAGNOSIS — Z800 Family history of malignant neoplasm of digestive organs: Secondary | ICD-10-CM

## 2019-11-16 DIAGNOSIS — Z87448 Personal history of other diseases of urinary system: Secondary | ICD-10-CM | POA: Diagnosis not present

## 2019-11-16 DIAGNOSIS — Z8719 Personal history of other diseases of the digestive system: Secondary | ICD-10-CM

## 2019-11-16 DIAGNOSIS — Z8271 Family history of polycystic kidney: Secondary | ICD-10-CM

## 2019-11-16 DIAGNOSIS — Z87442 Personal history of urinary calculi: Secondary | ICD-10-CM

## 2019-11-16 DIAGNOSIS — Z86711 Personal history of pulmonary embolism: Secondary | ICD-10-CM

## 2019-11-16 DIAGNOSIS — K51311 Ulcerative (chronic) rectosigmoiditis with rectal bleeding: Secondary | ICD-10-CM | POA: Diagnosis not present

## 2019-11-16 DIAGNOSIS — R109 Unspecified abdominal pain: Secondary | ICD-10-CM | POA: Diagnosis not present

## 2019-11-16 DIAGNOSIS — Z90710 Acquired absence of both cervix and uterus: Secondary | ICD-10-CM

## 2019-11-16 LAB — COMPREHENSIVE METABOLIC PANEL
ALT: 16 U/L (ref 0–44)
AST: 24 U/L (ref 15–41)
Albumin: 3.9 g/dL (ref 3.5–5.0)
Alkaline Phosphatase: 47 U/L (ref 38–126)
Anion gap: 12 (ref 5–15)
BUN: 15 mg/dL (ref 8–23)
CO2: 23 mmol/L (ref 22–32)
Calcium: 9.8 mg/dL (ref 8.9–10.3)
Chloride: 97 mmol/L — ABNORMAL LOW (ref 98–111)
Creatinine, Ser: 0.69 mg/dL (ref 0.44–1.00)
GFR, Estimated: >60 mL/min (ref >60)
Glucose, Bld: 98 mg/dL (ref 70–99)
Potassium: 4.5 mmol/L (ref 3.5–5.1)
Sodium: 132 mmol/L — ABNORMAL LOW (ref 135–145)
Total Bilirubin: 0.6 mg/dL (ref 0.3–1.2)
Total Protein: 7.3 g/dL (ref 6.5–8.1)

## 2019-11-16 LAB — CBC
HCT: 32.8 % — ABNORMAL LOW (ref 36.0–46.0)
HCT: 33.4 % — ABNORMAL LOW (ref 36.0–46.0)
Hemoglobin: 10.7 g/dL — ABNORMAL LOW (ref 12.0–15.0)
Hemoglobin: 11 g/dL — ABNORMAL LOW (ref 12.0–15.0)
MCH: 30.5 pg (ref 26.0–34.0)
MCH: 30.1 pg (ref 26.0–34.0)
MCHC: 32.6 g/dL (ref 30.0–36.0)
MCHC: 32.9 g/dL (ref 30.0–36.0)
MCV: 93.4 fL (ref 80.0–100.0)
MCV: 91.3 fL (ref 80.0–100.0)
Platelets: 387 10*3/uL (ref 150–400)
Platelets: 270 10*3/uL (ref 150–400)
RBC: 3.51 MIL/uL — ABNORMAL LOW (ref 3.87–5.11)
RBC: 3.66 MIL/uL — ABNORMAL LOW (ref 3.87–5.11)
RDW: 15 % (ref 11.5–15.5)
RDW: 15 % (ref 11.5–15.5)
WBC: 7.9 10*3/uL (ref 4.0–10.5)
WBC: 7.5 10*3/uL (ref 4.0–10.5)
nRBC: 0 % (ref 0.0–0.2)
nRBC: 0 % (ref 0.0–0.2)

## 2019-11-16 LAB — RESPIRATORY PANEL BY RT PCR (FLU A&B, COVID)
Influenza A by PCR: NEGATIVE
Influenza B by PCR: NEGATIVE
SARS Coronavirus 2 by RT PCR: NEGATIVE

## 2019-11-16 LAB — TYPE AND SCREEN
ABO/RH(D): O NEG
Antibody Screen: NEGATIVE

## 2019-11-16 LAB — URINALYSIS, ROUTINE W REFLEX MICROSCOPIC
Bilirubin Urine: NEGATIVE
Glucose, UA: NEGATIVE mg/dL
Hgb urine dipstick: NEGATIVE
Ketones, ur: NEGATIVE mg/dL
Leukocytes,Ua: NEGATIVE
Nitrite: NEGATIVE
Protein, ur: NEGATIVE mg/dL
Specific Gravity, Urine: 1.018 (ref 1.005–1.030)
pH: 8 (ref 5.0–8.0)

## 2019-11-16 LAB — PROTIME-INR
INR: 1 (ref 0.8–1.2)
Prothrombin Time: 12.9 seconds (ref 11.4–15.2)

## 2019-11-16 LAB — APTT: aPTT: 29 seconds (ref 24–36)

## 2019-11-16 LAB — POC OCCULT BLOOD, ED: Fecal Occult Bld: POSITIVE — AB

## 2019-11-16 MED ORDER — ALBUTEROL SULFATE HFA 108 (90 BASE) MCG/ACT IN AERS
2.0000 | INHALATION_SPRAY | RESPIRATORY_TRACT | Status: DC | PRN
  Filled 2019-11-16: qty 6.7

## 2019-11-16 MED ORDER — SODIUM CHLORIDE 0.9 % IV SOLN: INTRAVENOUS | Status: AC

## 2019-11-16 MED ORDER — FLUTICASONE PROPIONATE HFA 110 MCG/ACT IN AERO: 1.0000 | INHALATION_SPRAY | Freq: Two times a day (BID) | RESPIRATORY_TRACT | Status: DC

## 2019-11-16 MED ORDER — BUDESONIDE 0.25 MG/2ML IN SUSP
0.2500 mg | Freq: Two times a day (BID) | RESPIRATORY_TRACT | Status: DC
  Administered 2019-11-17 – 2019-11-19 (×6): 0.25 mg via RESPIRATORY_TRACT
  Filled 2019-11-16 (×8): qty 2

## 2019-11-16 MED ORDER — ACETAMINOPHEN 325 MG PO TABS
650.0000 mg | ORAL_TABLET | Freq: Four times a day (QID) | ORAL | Status: DC | PRN
  Administered 2019-11-16 – 2019-11-20 (×7): 650 mg via ORAL
  Filled 2019-11-16 (×7): qty 2

## 2019-11-16 MED ORDER — LEVOTHYROXINE SODIUM 50 MCG PO TABS
75.0000 ug | ORAL_TABLET | Freq: Every day | ORAL | Status: DC
  Administered 2019-11-17: 75 ug via ORAL
  Filled 2019-11-16: qty 1

## 2019-11-16 MED ORDER — PIPERACILLIN-TAZOBACTAM 3.375 G IVPB 30 MIN
3.3750 g | Freq: Once | INTRAVENOUS | Status: AC
  Administered 2019-11-16: 3.375 g via INTRAVENOUS
  Filled 2019-11-16: qty 50

## 2019-11-16 MED ORDER — SODIUM CHLORIDE 0.9 % IV BOLUS
500.0000 mL | Freq: Once | INTRAVENOUS | Status: AC
  Administered 2019-11-16: 500 mL via INTRAVENOUS

## 2019-11-16 MED ORDER — SODIUM CHLORIDE 0.9 % IV SOLN: INTRAVENOUS | Status: DC

## 2019-11-16 MED ORDER — PIPERACILLIN-TAZOBACTAM 3.375 G IVPB
3.3750 g | Freq: Three times a day (TID) | INTRAVENOUS | Status: DC
  Administered 2019-11-17 (×3): 3.375 g via INTRAVENOUS
  Filled 2019-11-16 (×3): qty 50

## 2019-11-16 MED ORDER — LEVOTHYROXINE SODIUM 50 MCG PO TABS
50.0000 ug | ORAL_TABLET | ORAL | Status: DC
  Administered 2019-11-18: 50 ug via ORAL
  Filled 2019-11-16 (×2): qty 1

## 2019-11-16 MED ORDER — ACETAMINOPHEN 650 MG RE SUPP: 650.0000 mg | Freq: Four times a day (QID) | RECTAL | Status: DC | PRN

## 2019-11-16 MED ORDER — SODIUM CHLORIDE (PF) 0.9 % IJ SOLN
INTRAMUSCULAR | Status: AC
  Filled 2019-11-16: qty 50

## 2019-11-16 MED ORDER — IOHEXOL 300 MG/ML  SOLN
100.0000 mL | Freq: Once | INTRAMUSCULAR | Status: AC | PRN
  Administered 2019-11-16: 100 mL via INTRAVENOUS

## 2019-11-16 NOTE — ED Provider Notes (Signed)
[facility] DEPT Provider Note   CSN: [id number] Arrival date & time: 11/16/19  1251     History Chief Complaint  Patient presents with  . Rectal Bleeding  . Fatigue    Margaret RIVERE is a 85 y.o. female.  HPI   Patient presents to the ED for evaluation of rectal bleeding. Patient states his symptoms started several days ago. She was noticing some blood in her stool. She also experienced abdominal cramping. Patient did modify her diet but tried to advance it as she has not been eating or drinking well and felt her energy decreasing. Patient states the pain has primarily been in the lower abdomen on both sides. Patient states today she started to notice bright red blood in her stool. She had several episodes.  Patient states she has a history of prior abdominal bleeding. Patient states she has a condition called "purpura." Patient states she has been misdiagnosed for many years. When she gets the bouts of rectal bleeding she is often treated with antibiotics. Patient states she has not had a colonoscopy recently for this rectal bleeding because of complications associated with medical mismanagement from a prior colonoscopy. Patient states she had to have part of her intestines removed. She refuses to have any further colonoscopies.  Review of medical records indicate the patient has been followed by [facility]. Patient has been diagnosed with diverticulitis in the past. Patient did have a CT scan back in April of 2 oh 1021 that showed acute diverticulitis. Patient also has had internal hemorrhoids noted on anoscopy.  Past Medical History:  Diagnosis Date  . ABDOMINAL PAIN, CHRONIC 05/28/2008  . ALLERGIC RHINITIS 09/21/2006  . ANEMIA-NOS 09/21/2006  . ASTHMA 09/21/2006  . ASYMPTOMATIC POSTMENOPAUSAL STATUS 05/28/2008  . Cataract   . CHEST PAIN, ATYPICAL 05/28/2008  . COPD (chronic obstructive pulmonary disease) ([place])   . DDD (degenerative disc  disease)   . DIVERTICULITIS, HX OF 09/21/2006  . GERD 09/21/2006  . HYPERCHOLESTEROLEMIA 06/05/2009  . HYPERTENSION 09/21/2006  . HYPOTHYROIDISM 09/21/2006  . Peptic ulcer disease   . Purpura ([place])   . RENAL INSUFFICIENCY 09/21/2006  . Shingles   . Small bowel obstruction ([place])   . Somatization disorder 04/25/2007  . UNSPECIFIED URINARY CALCULUS 05/28/2008  . UTI (urinary tract infection) 07/2019    Patient Active Problem List   Diagnosis Date Noted  . LLQ abdominal pain 10/31/2019  . Diverticulitis 10/31/2019  . UTI (urinary tract infection) 08/06/2019  . COPD (chronic obstructive pulmonary disease) ([place])   . Colitis   . Vitamin D deficiency 05/01/2019  . Arthralgia 08/21/2017  . Osteoporosis 11/21/2016  . Postmenopausal status 09/06/2016  . Other nonspecific abnormal finding of lung field 08/14/2015  . Hyponatremia 07/21/2014  . Menopausal state 07/18/2014  . Hematest positive stools 08/05/2013  . Anemia, blood loss 08/05/2013  . Cerumen impaction 06/03/2013  . History of adrenal disorder 10/15/2012  . Rash and nonspecific skin eruption 10/15/2012  . Iron deficiency anemia 06/20/2012  . Psychogenic polydipsia 06/20/2012  . Nonspecific abnormal electrocardiogram (ECG) (EKG) 06/20/2012  . Polyuria 05/02/2012  . Pelvic pain in female 08/16/2011  . Leg ulcer ([place]) 06/16/2010  . Routine general medical examination at a health care facility 06/11/2010  . Encounter for long-term (current) use of other medications 06/11/2010  . HYPERCHOLESTEROLEMIA 06/05/2009  . UNSPECIFIED URINARY CALCULUS 05/28/2008  . CHEST PAIN, ATYPICAL 05/28/2008  . ABDOMINAL PAIN, CHRONIC 05/28/2008  . ASYMPTOMATIC POSTMENOPAUSAL STATUS 05/28/2008  . SOMATIZATION DISORDER  04/25/2007  . Hypothyroidism 09/21/2006  . Essential hypertension 09/21/2006  . ALLERGIC RHINITIS 09/21/2006  . ASTHMA 09/21/2006  . GERD 09/21/2006  . Disorder resulting from impaired renal function 09/21/2006  . DIVERTICULITIS, HX  OF 09/21/2006    Past Surgical History:  Procedure Laterality Date  . ABDOMINAL HYSTERECTOMY  1981  . acute Nephritis  1958  . APPENDECTOMY  1959  . CESAREAN SECTION    . CHOLECYSTECTOMY  1983  . COLON SURGERY    . DENTAL SURGERY  1970  . NASAL SINUS SURGERY  1967  . Pulmonary thrombosis    . TONSILLECTOMY    . TUBAL LIGATION       OB History   No obstetric history on file.     Family History  Problem Relation Age of Onset  . Polycystic kidney disease Mother   . Pancreatic cancer Mother   . Other Father        Schamberg disease  . Bladder Cancer Father   . Hypertension Father   . Marfan syndrome Son   . Hemochromatosis Son   . Cirrhosis Son   . Allergic rhinitis Sister   . Other Brother        bone issue as child; multiple fractures but seemed to age out of this  . Hemochromatosis Cousin   . Arthritis Sister     Social History   Tobacco Use  . Smoking status: Never Smoker  . Smokeless tobacco: Never Used  Vaping Use  . Vaping Use: Never used  Substance Use Topics  . Alcohol use: No  . Drug use: No    Home Medications Prior to Admission medications   Medication Sig Start Date End Date Taking? Authorizing Provider  acetaminophen (TYLENOL) 325 MG tablet Take 650-975 mg by mouth daily.     [provider]  Cholecalciferol (VITAMIN D3 PO) Take 1 tablet by mouth 2 (two) times a week. Saturday and Sunday    [provider]  Ferrous Sulfate (IRON) 325 (65 FE) MG TABS Take 1 tablet by mouth daily.     [provider]  fluticasone (FLOVENT HFA) 110 MCG/ACT inhaler TAKE 1 PUFF BY MOUTH TWICE A DAY 10/02/19   Caren Macadam, MD  levothyroxine (SYNTHROID) 50 MCG tablet TAKE 1 TABLET BY MOUTH EVERY DAY Patient taking differently: Take 50 mcg by mouth every Monday, Tuesday, Wednesday, Thursday, and Friday.  12/26/18   Caren Macadam, MD  levothyroxine (SYNTHROID) 75 MCG tablet TAKE BY MOUTH 2 DAYS PER WEEK 11/04/19   Koberlein, Steele Berg, MD  PROAIR HFA 108 (90 Base) MCG/ACT inhaler INHALE 2 PUFFS INTO THE LUNGS EVERY 4 HOURS AS NEEDED FOR WHEEZING OR SHORTNESS OF BREATH Patient taking differently: Inhale 2 puffs into the lungs every 4 (four) hours as needed for wheezing or shortness of breath.  02/05/19   Koberlein, Steele Berg, MD  senna-docusate (SENOKOT-S) 8.6-50 MG tablet Take 1 tablet by mouth at bedtime as needed for mild constipation. 08/09/19   Hosie Poisson, MD  Spacer/Aero-Holding Chambers (E-Z SPACER) inhaler Use as instructed 11/08/17   Caren Macadam, MD    Allergies    [place] nut (berthollefia [place]) skin test, Fluconazole, Metronidazole, Pepcid [famotidine], Shellfish allergy, Macrobid [nitrofurantoin], and Pantoprazole  Review of Systems   Review of Systems  All other systems reviewed and are negative.   Physical Exam Updated Vital Signs BP (!) 147/68   Pulse 65   Temp 98 F (36.7 C)   Resp 19  Ht 1.651 m (5' 5" )   Wt 64.9 kg   SpO2 98%   BMI 23.80 kg/m   Physical Exam Vitals and nursing note reviewed.  Constitutional:      General: She is not in acute distress.    Appearance: She is well-developed.  HENT:     Head: Normocephalic and atraumatic.     Right Ear: External ear normal.     Left Ear: External ear normal.  Eyes:     General: No scleral icterus.       Right eye: No discharge.        Left eye: No discharge.     Conjunctiva/sclera: Conjunctivae normal.  Neck:     Trachea: No tracheal deviation.  Cardiovascular:     Rate and Rhythm: Normal rate and regular rhythm.  Pulmonary:     Effort: Pulmonary effort is normal. No respiratory distress.     Breath sounds: Normal breath sounds. No stridor. No wheezing or rales.  Abdominal:     General: Bowel sounds are normal. There is no distension.     Palpations: Abdomen is soft.     Tenderness: There is abdominal tenderness. There is no guarding or rebound.     Comments: Suprapubic right and left lower quadrant  Genitourinary:     Comments: No bright red blood, no melena: No mass on rectal exam Musculoskeletal:        General: No tenderness.     Cervical back: Neck supple.  Skin:    General: Skin is warm and dry.     Findings: No rash.  Neurological:     Mental Status: She is alert.     Cranial Nerves: No cranial nerve deficit (no facial droop, extraocular movements intact, no slurred speech).     Sensory: No sensory deficit.     Motor: No abnormal muscle tone or seizure activity.     Coordination: Coordination normal.     ED Results / Procedures / Treatments   Labs (all labs ordered are listed, but only abnormal results are displayed) Labs Reviewed  COMPREHENSIVE METABOLIC PANEL - Abnormal; Notable for the following components:      Result Value   Sodium 132 (*)    Chloride 97 (*)    All other components within normal limits  CBC - Abnormal; Notable for the following components:   RBC 3.51 (*)    Hemoglobin 10.7 (*)    HCT 32.8 (*)    All other components within normal limits  POC OCCULT BLOOD, ED - Abnormal; Notable for the following components:   Fecal Occult Bld POSITIVE (*)    All other components within normal limits  RESPIRATORY PANEL BY RT PCR (FLU A&B, COVID)  APTT  PROTIME-INR  URINALYSIS, ROUTINE W REFLEX MICROSCOPIC  POC OCCULT BLOOD, ED  TYPE AND SCREEN    EKG None  Radiology CT ABDOMEN PELVIS W CONTRAST  Result Date: 11/16/2019 CLINICAL DATA:  85 year old female with lower abdominal pain. History of diverticulitis. EXAM: CT ABDOMEN AND PELVIS WITH CONTRAST TECHNIQUE: Multidetector CT imaging of the abdomen and pelvis was performed using the standard protocol following bolus administration of intravenous contrast. CONTRAST:  173m OMNIPAQUE IOHEXOL 300 MG/ML  SOLN COMPARISON:  CT abdomen pelvis dated 08/06/2019. FINDINGS: Lower chest: The visualized lung bases are clear. There is coronary vascular calcification. No intra-abdominal free air or free fluid. Hepatobiliary: The liver is  unremarkable. No intrahepatic biliary ductal dilatation. Cholecystectomy. There is mild dilatation of the common bile duct, likely post  cholecystectomy. No retained calcified stone noted in the central CBD. Pancreas: Unremarkable. No pancreatic ductal dilatation or surrounding inflammatory changes. Spleen: Normal in size without focal abnormality. Adrenals/Urinary Tract: The adrenal glands unremarkable. There is no hydronephrosis on either side. There is symmetric enhancement and excretion of contrast by both kidneys. The visualized ureters and urinary bladder appear unremarkable. Stomach/Bowel: There is a moderate size hiatal hernia containing a portion of the stomach. There is postsurgical changes of bowel with anastomotic suture in the lower abdomen. There is sigmoid diverticulosis. There is diffuse inflammatory changes of the rectosigmoid, likely proctitis. Clinical correlation is recommended. No abscess or perforation. There is moderate stool throughout the colon. No bowel obstruction. Appendectomy. Vascular/Lymphatic: The abdominal aorta and IVC are unremarkable. No portal venous gas. There is no adenopathy. Reproductive: Hysterectomy. Other: Midline vertical anterior pelvic wall incisional scar. Musculoskeletal: Degenerative changes of the spine. No acute osseous pathology. IMPRESSION: 1. Inflammatory changes of the rectosigmoid, likely proctitis. Clinical correlation is recommended. No abscess or perforation. 2. Sigmoid diverticulosis. 3. Moderate size hiatal hernia containing a portion of the stomach. 4. Aortic Atherosclerosis (ICD10-I70.0). Electronically Signed   By: Anner Crete M.D.   On: 11/16/2019 16:36    Procedures Procedures (including critical care time)  Medications Ordered in ED Medications  sodium chloride 0.9 % bolus 500 mL (0 mLs Intravenous Stopped 11/16/19 1558)    And  0.9 %  sodium chloride infusion (has no administration in time range)  sodium chloride (PF) 0.9 % injection  (has no administration in time range)  piperacillin-tazobactam (ZOSYN) IVPB 3.375 g (has no administration in time range)  iohexol (OMNIPAQUE) 300 MG/ML solution 100 mL (100 mLs Intravenous Contrast Given 11/16/19 1541)    ED Course  I have reviewed the triage vital signs and the nursing notes.  Pertinent labs & imaging results that were available during my care of the patient were reviewed by me and considered in my medical decision making (see chart for details).  Clinical Course as of Nov 15 1816  Sat Nov 16, 2019  1447 Hemoglobin improved compared to previous   [JK]  1447 Hemoccult is positive   [JK]  1505 Will ct to evaluate for diverticulitis   [JK]  1810 CT scan shows evidence of inflammation in the rectosigmoid region.  More consistent with proctitis and diverticulitis.   [JK]    Clinical Course User Index [JK] Dorie Rank, MD   MDM Rules/Calculators/A&P                          Pt presents with abd pain, recurrent gi bleeding.  Pt has history of diverticulitis.  Exam notable for guaic positive stools.  No significant anemia and does not require transfusion.  CT scan shows findings consistent with proctitis.  Pt concerned about going home with her bleeding and general weakness.  Will start on iv abx.  Consult with medical service for admission. Final Clinical Impression(s) / ED Diagnoses Final diagnoses:  Proctitis      Dorie Rank, MD 11/16/19 1820

## 2019-11-16 NOTE — ED Triage Notes (Signed)
Pt had rectal bleeding for 5 days. Having abd pains. Doesn't take blood thinners.

## 2019-11-16 NOTE — H&P (Signed)
[name] WUJ:[id number] DOB: [birth year] DOA: 11/16/2019     PCP: Wynn Banker, MD   Outpatient Specialists:    GI Meredith Pel, NP  (  [facility])    Patient arrived to ER on 11/16/19 at 1251 Referred by Attending Linwood Dibbles, MD   [place] [facility] [place]  Chief Complaint:  Chief Complaint  Patient presents with  . Rectal Bleeding  . Fatigue    HPI: [name] is a 85 y.o. female with medical history significant of diverticultis, anemia, GERD, HLD, HTn, hypothyroidism,PPI, SBO, CKD,CoPD, Purpura from steroids (per patient)    Presented with   3d of blood per rectum and lower abd pain Reports she has had some blood per rectum for years abut has gotten worse recently He reports abd crumping No fever or chills  denies constipation reports regular BM's Reports significant generalied fatigue  Infectious risk factors:  Reports abdominal pain,      Has  not been vaccinated against COVID ( interested in getting it while hospitalized)   Initial COVID TEST   in house  PCR testing  Pending  Lab Results  Component Value Date   SARSCOV2NAA NEGATIVE 08/06/2019   SARSCOV2NAA NOT DETECTED 04/08/2019    Regarding pertinent Chronic problems:     Hyperlipidemia -  Not on statins Lipid Panel     Component Value Date/Time   CHOL 172 08/15/2016 1358   TRIG 118.0 08/15/2016 1358   HDL 66.40 08/15/2016 1358   CHOLHDL 3 08/15/2016 1358   VLDL 23.6 08/15/2016 1358   LDLCALC 82 08/15/2016 1358   LDLDIRECT 120.8 06/05/2009 0000     HTN not on any meds    Hypothyroidism:  Lab Results  Component Value Date   TSH 1.66 04/30/2019   on synthroid     Asthma/COPD - not  followed by pulmonology   not  on baseline oxygen      Chronic anemia - baseline hg Hemoglobin & Hematocrit  Recent Labs    08/08/19 0122 08/09/19 0148 11/16/19 1318  HGB 8.9* 8.9* 10.7*    While in ER:  Hg up from baseline to 10.7  Hemoccult positive Ct showed proctis Started on  IV antibiotics (zosyn)   ER Provider notified [facility] through Smart chat  Hospitalist was called for admission for Lower Gi bleed  The following Work up has been ordered so far:  Orders Placed This Encounter  Procedures  . Respiratory Panel by RT PCR (Flu A&B, Covid) - Nasopharyngeal Swab  . CT ABDOMEN PELVIS W CONTRAST  . Comprehensive metabolic panel  . CBC  . APTT  . Protime-INR  . Urinalysis, Routine w reflex microscopic  . Diet NPO time specified  . Orthostatic Vital signs  . Place X2 Large Bore IV's  . Initiate Carrier Fluid Protocol  . Place X2 Large Bore IV's  . Initiate Carrier Fluid Protocol  . Consult to hospitalist  ALL PATIENTS BEING ADMITTED/HAVING PROCEDURES NEED COVID-19 SCREENING  . Pulse oximetry, continuous  . POC occult blood, ED  . POC occult blood, ED Provider will collect  . Type and screen [facility]     Following Medications were ordered in ER: Medications  sodium chloride 0.9 % bolus 500 mL (0 mLs Intravenous Stopped 11/16/19 1558)    And  0.9 %  sodium chloride infusion (has no administration in time range)  sodium chloride (PF) 0.9 % injection (has no administration in time range)  piperacillin-tazobactam (  ZOSYN) IVPB 3.375 g (3.375 g Intravenous New Bag/Given 11/16/19 1826)  piperacillin-tazobactam (ZOSYN) IVPB 3.375 g (has no administration in time range)  iohexol (OMNIPAQUE) 300 MG/ML solution 100 mL (100 mLs Intravenous Contrast Given 11/16/19 1541)        Consult Orders  (From admission, onward)         Start     Ordered   11/16/19 1815  Consult to hospitalist  ALL PATIENTS BEING ADMITTED/HAVING PROCEDURES NEED COVID-19 SCREENING  Once       Comments: ALL PATIENTS BEING ADMITTED/HAVING PROCEDURES NEED COVID-19 SCREENING  Provider:  (Not yet assigned)  Question Answer Comment  Place call to: [facility]   Reason for Consult Admit      11/16/19 1815          Significant initial  Findings: Abnormal Labs  Reviewed  COMPREHENSIVE METABOLIC PANEL - Abnormal; Notable for the following components:      Result Value   Sodium 132 (*)    Chloride 97 (*)    All other components within normal limits  CBC - Abnormal; Notable for the following components:   RBC 3.51 (*)    Hemoglobin 10.7 (*)    HCT 32.8 (*)    All other components within normal limits  POC OCCULT BLOOD, ED - Abnormal; Notable for the following components:   Fecal Occult Bld POSITIVE (*)    All other components within normal limits    Otherwise labs showing:    Recent Labs  Lab 11/16/19 1318  NA 132*  K 4.5  CO2 23  GLUCOSE 98  BUN 15  CREATININE 0.69  CALCIUM 9.8    Cr   Stable,  Lab Results  Component Value Date   CREATININE 0.69 11/16/2019   CREATININE 0.55 08/09/2019   CREATININE 0.54 08/08/2019    Recent Labs  Lab 11/16/19 1318  AST 24  ALT 16  ALKPHOS 47  BILITOT 0.6  PROT 7.3  ALBUMIN 3.9   Lab Results  Component Value Date   CALCIUM 9.8 11/16/2019   WBC      Component Value Date/Time   WBC 7.9 11/16/2019 1318   LYMPHSABS 1.8 04/30/2019 1449   MONOABS 0.9 04/30/2019 1449   EOSABS 0.5 04/30/2019 1449   BASOSABS 0.1 04/30/2019 1449    Plt: Lab Results  Component Value Date   PLT 387 11/16/2019    COVID-19 Labs  No results for input(s): DDIMER, FERRITIN, LDH, CRP in the last 72 hours.  Lab Results  Component Value Date   SARSCOV2NAA NEGATIVE 08/06/2019   SARSCOV2NAA NOT DETECTED 04/08/2019    HG/HCT  Stable,     Component Value Date/Time   HGB 10.7 (L) 11/16/2019 1318   HCT 32.8 (L) 11/16/2019 1318   MCV 93.4 11/16/2019 1318   MCV 91.8 09/14/2015 1447      ECG: not  Ordered    UA ordered    Ordered    CTabd/pelvis -likely proctitis.    ED Triage Vitals  Enc Vitals Group     BP 11/16/19 1259 (!) 150/72     Pulse Rate 11/16/19 1259 78     Resp 11/16/19 1259 16     Temp 11/16/19 1259 97.6 F (36.4 C)     Temp Source 11/16/19 1259 Oral     SpO2 11/16/19 1259 98 %       Weight 11/16/19 1259 143 lb (64.9 kg)     Height 11/16/19 1259 5\' 5"  (1.651 m)  Head Circumference --      Peak Flow --      Pain Score 11/16/19 1302 6     Pain Loc --      Pain Edu? --      Excl. in GC? --   TMAX(24)@       Latest  Blood pressure (!) 147/68, pulse 65, temperature 98 F (36.7 C), resp. rate 19, height 5\' 5"  (1.651 m), weight 64.9 kg, SpO2 98 %.    Review of Systems:    Pertinent positives include: , blood in stool abdominal pain,  Constitutional:  No weight loss, night sweats, Fevers, chills, fatigue, weight loss  HEENT:  No headaches, Difficulty swallowing,Tooth/dental problems,Sore throat,  No sneezing, itching, ear ache, nasal congestion, post nasal drip,  Cardio-vascular:  No chest pain, Orthopnea, PND, anasarca, dizziness, palpitations.no Bilateral lower extremity swelling  GI:  No heartburn, indigestion,  nausea, vomiting, diarrhea, change in bowel habits, loss of appetite, melena, hematemesis Resp:  no shortness of breath at rest. No dyspnea on exertion, No excess mucus, no productive cough, No non-productive cough, No coughing up of blood.No change in color of mucus.No wheezing. Skin:  no rash or lesions. No jaundice GU:  no dysuria, change in color of urine, no urgency or frequency. No straining to urinate.  No flank pain.  Musculoskeletal:  No joint pain or no joint swelling. No decreased range of motion. No back pain.  Psych:  No change in mood or affect. No depression or anxiety. No memory loss.  Neuro: no localizing neurological complaints, no tingling, no weakness, no double vision, no gait abnormality, no slurred speech, no confusion  All systems reviewed and apart from HOPI all are negative  Past Medical History:   Past Medical History:  Diagnosis Date  . ABDOMINAL PAIN, CHRONIC 05/28/2008  . ALLERGIC RHINITIS 09/21/2006  . ANEMIA-NOS 09/21/2006  . ASTHMA 09/21/2006  . ASYMPTOMATIC POSTMENOPAUSAL STATUS 05/28/2008  . Cataract   .  CHEST PAIN, ATYPICAL 05/28/2008  . COPD (chronic obstructive pulmonary disease) (HCC)   . DDD (degenerative disc disease)   . DIVERTICULITIS, HX OF 09/21/2006  . GERD 09/21/2006  . HYPERCHOLESTEROLEMIA 06/05/2009  . HYPERTENSION 09/21/2006  . HYPOTHYROIDISM 09/21/2006  . Peptic ulcer disease   . Purpura (HCC)   . RENAL INSUFFICIENCY 09/21/2006  . Shingles   . Small bowel obstruction (HCC)   . Somatization disorder 04/25/2007  . UNSPECIFIED URINARY CALCULUS 05/28/2008  . UTI (urinary tract infection) 07/2019     Past Surgical History:  Procedure Laterality Date  . ABDOMINAL HYSTERECTOMY  1981  . acute Nephritis  1958  . APPENDECTOMY  1959  . CESAREAN SECTION    . CHOLECYSTECTOMY  1983  . COLON SURGERY    . DENTAL SURGERY  1970  . NASAL SINUS SURGERY  1967  . Pulmonary thrombosis    . TONSILLECTOMY    . TUBAL LIGATION      Social History:  Ambulatory   Gilmer Mor     reports that she has never smoked. She has never used smokeless tobacco. She reports that she does not drink alcohol and does not use drugs.   Family History:   Family History  Problem Relation Age of Onset  . Polycystic kidney disease Mother   . Pancreatic cancer Mother   . Other Father        Schamberg disease  . Bladder Cancer Father   . Hypertension Father   . Marfan syndrome Son   . Hemochromatosis Son   .  Cirrhosis Son   . Allergic rhinitis Sister   . Other Brother        bone issue as child; multiple fractures but seemed to age out of this  . Hemochromatosis Cousin   . Arthritis Sister     Allergies: Allergies  Allergen Reactions  . [place] Nut (Berthollefia [place]) Skin Test Anaphylaxis  . Fluconazole Shortness Of Breath  . Metronidazole Shortness Of Breath and Nausea And Vomiting  . Pepcid [Famotidine] Shortness Of Breath and Other (See Comments)    Dizziness  . Shellfish Allergy Anaphylaxis  . Macrobid [Nitrofurantoin]     REACTION: Syncope  . Pantoprazole Other (See Comments)    CHEST PAIN      Prior to Admission medications   Medication Sig Start Date End Date Taking? Authorizing Provider  acetaminophen (TYLENOL) 325 MG tablet Take 650-975 mg by mouth daily.     [provider]  Cholecalciferol (VITAMIN D3 PO) Take 1 tablet by mouth 2 (two) times a week. Saturday and Sunday    [provider]  Ferrous Sulfate (IRON) 325 (65 FE) MG TABS Take 1 tablet by mouth daily.     [provider]  fluticasone (FLOVENT HFA) 110 MCG/ACT inhaler TAKE 1 PUFF BY MOUTH TWICE A DAY 10/02/19   Wynn Banker, MD  levothyroxine (SYNTHROID) 50 MCG tablet TAKE 1 TABLET BY MOUTH EVERY DAY Patient taking differently: Take 50 mcg by mouth every Monday, Tuesday, Wednesday, Thursday, and Friday.  12/26/18   Wynn Banker, MD  levothyroxine (SYNTHROID) 75 MCG tablet TAKE BY MOUTH 2 DAYS PER WEEK 11/04/19   Koberlein, Paris Lore, MD  PROAIR HFA 108 (90 Base) MCG/ACT inhaler INHALE 2 PUFFS INTO THE LUNGS EVERY 4 HOURS AS NEEDED FOR WHEEZING OR SHORTNESS OF BREATH Patient taking differently: Inhale 2 puffs into the lungs every 4 (four) hours as needed for wheezing or shortness of breath.  02/05/19   Koberlein, Paris Lore, MD  senna-docusate (SENOKOT-S) 8.6-50 MG tablet Take 1 tablet by mouth at bedtime as needed for mild constipation. 08/09/19   Kathlen Mody, MD  Spacer/Aero-Holding Chambers (E-Z SPACER) inhaler Use as instructed 11/08/17   Wynn Banker, MD   Physical Exam: Vitals with BMI 11/16/2019 11/16/2019 11/16/2019  Height - - -  Weight - - -  BMI - - -  Systolic 147 141 409  Diastolic 68 78 70  Pulse 65 72 76     1. General:  in No  Acute distress    Chronically ill  -appearing 2. Psychological: Alert and   Oriented 3. Head/ENT:   Dry Mucous Membranes                          Head Non traumatic, neck supple                          Poor Dentition 4. SKIN:    decreased Skin turgor,  Skin clean Dry and intact no rash 5. Heart: Regular rate and rhythm no  Murmur, no Rub or gallop 6. Lungs: no wheezes or crackles   7. Abdomen: Soft, generalized tender, Non distended bowel sounds present 8. Lower extremities: no clubbing, cyanosis, no   edema 9. Neurologically Grossly intact, moving all 4 extremities equally  10. MSK: Normal range of motion   All other LABS:     Recent Labs  Lab 11/16/19 1318  WBC 7.9  HGB 10.7*  HCT  32.8*  MCV 93.4  PLT 387     Recent Labs  Lab 11/16/19 1318  NA 132*  K 4.5  CL 97*  CO2 23  GLUCOSE 98  BUN 15  CREATININE 0.69  CALCIUM 9.8     Recent Labs  Lab 11/16/19 1318  AST 24  ALT 16  ALKPHOS 47  BILITOT 0.6  PROT 7.3  ALBUMIN 3.9   Cultures:    Component Value Date/Time   SDES URINE, RANDOM 08/06/2019 0122   SPECREQUEST  08/06/2019 0122    NONE Performed at [facility], [address], [place], [place] [address]    CULT >=100,000 COLONIES/mL CITROBACTER BRAAKII (A) 08/06/2019 0122   REPTSTATUS 08/08/2019 FINAL 08/06/2019 0122     Radiological Exams on Admission: CT ABDOMEN PELVIS W CONTRAST  Result Date: 11/16/2019 CLINICAL DATA:  85 year old female with lower abdominal pain. History of diverticulitis. EXAM: CT ABDOMEN AND PELVIS WITH CONTRAST TECHNIQUE: Multidetector CT imaging of the abdomen and pelvis was performed using the standard protocol following bolus administration of intravenous contrast. CONTRAST:  OMNIPAQUE IOHEXOL 300 MG/ML  SOLN COMPARISON:  CT abdomen pelvis dated 08/06/2019. FINDINGS: Lower chest: The visualized lung bases are clear. There is coronary vascular calcification. No intra-abdominal free air or free fluid. Hepatobiliary: The liver is unremarkable. No intrahepatic biliary ductal dilatation. Cholecystectomy. There is mild dilatation of the common bile duct, likely post cholecystectomy. No retained calcified stone noted in the central CBD. Pancreas: Unremarkable. No pancreatic ductal dilatation or surrounding inflammatory changes. Spleen: Normal in  size without focal abnormality. Adrenals/Urinary Tract: The adrenal glands unremarkable. There is no hydronephrosis on either side. There is symmetric enhancement and excretion of contrast by both kidneys. The visualized ureters and urinary bladder appear unremarkable. Stomach/Bowel: There is a moderate size hiatal hernia containing a portion of the stomach. There is postsurgical changes of bowel with anastomotic suture in the lower abdomen. There is sigmoid diverticulosis. There is diffuse inflammatory changes of the rectosigmoid, likely proctitis. Clinical correlation is recommended. No abscess or perforation. There is moderate stool throughout the colon. No bowel obstruction. Appendectomy. Vascular/Lymphatic: The abdominal aorta and IVC are unremarkable. No portal venous gas. There is no adenopathy. Reproductive: Hysterectomy. Other: Midline vertical anterior pelvic wall incisional scar. Musculoskeletal: Degenerative changes of the spine. No acute osseous pathology. IMPRESSION: 1. Inflammatory changes of the rectosigmoid, likely proctitis. Clinical correlation is recommended. No abscess or perforation. 2. Sigmoid diverticulosis. 3. Moderate size hiatal hernia containing a portion of the stomach. 4. Aortic Atherosclerosis (ICD10-I70.0). Electronically Signed   By: Elgie Collard M.D.   On: 11/16/2019 16:36    Chart has been reviewed   Assessment/Plan   85 y.o. female with medical history significant of diverticultis, anemia, GERD, HLD, HTn, hypothyroidism,PPI, SBO, CKD,CoPD     Admitted for lower gi bleed  Present on Admission: . Lower GI bleed - - Suspect Lower Gi source  No hx of melena, or BUN elevation to  suggest otherwise  - Admit  For further management given:  Age >60 years, comorbid illnesses  ,  gross rectal bleeding,  rebleeding    -  most likely due to proctotitis not ed on CT - on Zosyn    -  ER  Provider msged to gastroenterology (  [facility]) they will see patient in a.m. appreciate their  consult   - serial CBC.    - Monitor for any recurrence,  evidence of hemodynamic instability or significant blood loss -  type and screen,  -  Transfuse as needed for hemoglobin below 7 or <9 if evidence of significant  bleeding  - Establish at least 2 PIV and fluid resuscitate   - clear liquids for tonight keep nothing by mouth post midnight,  -  monitor for Recurrent significant  Bleeding of red blood and hemodynamic instability in which case Bleeding scan and IR consult would be indicated.   . Hypothyroidism - - Check TSH continue home medications at current dose  . Iron deficiency anemia -check anemia panel, no indication for blood transfusion for now  . HYPERCHOLESTEROLEMIA - no on statins  . GERD - on PPI  . Essential hypertension not on BP meds at home Allow permissive HTn  . COPD (chronic obstructive pulmonary disease) (HCC)/ continue home meds    NOt vaccinated for against COVID infection  Pt is interested in getting vaccinated while hospitalized, please arrange Other plan as per orders.  DVT prophylaxis:  SCD       Code Status:    Code Status: Prior   DNR/DNI  as per patient   I had personally discussed CODE STATUS with patient     Family Communication:   Family not at  Bedside    Disposition Plan:                            Back to current facility when stable                            Following barriers for discharge:                                                           Anemia stable                             Pain controlled with PO medications                                                             Will need to be able to tolerate PO                                                      Will need consultants to evaluate patient prior to discharge                      Would benefit from PT/OT eval prior to DC  Ordered                                        Consults called: [facility] ws notified thorough snap chat  Admission status:  ED  Disposition    ED Disposition Condition Comment   Admit  [facility]ospital Area: [facility] [[id number]]  Level of Care: Telemetry [5]  Admit to tele based on following criteria: Other see comments  Comments: gi bleed  May admit patient to [facility] or [facility] if equivalent level of care is available:: No  Covid Evaluation: Asymptomatic Screening Protocol (No Symptoms)  Diagnosis: Lower GI bleed [829562]  Admitting Physician: Therisa Doyne [[id number]]  Attending Physician: Therisa Doyne [[id number]]  Estimated length of stay: 3 - 4 days  Certification:: I certify this patient will need inpatient services for at least 2 midnights          inpatient     I Expect 2 midnight stay secondary to severity of patient's current illness need for inpatient interventions justified by the following:   Severe lab/radiological/exam abnormalities including:  Proctitis on CT   and extensive comorbidities including:   COPD/asthma .     That are currently affecting medical management.   I expect  patient to be hospitalized for 2 midnights requiring inpatient medical care.  Patient is at high risk for adverse outcome (such as loss of life or disability) if not treated.  Indication for inpatient stay as follows:    inability to maintain oral hydration    Need for operative/procedural  intervention    Need for IV antibiotics, IV fluids, IV PPI    Level of care  tele  For 12H        Precautions: admitted as asymptomatic screening protocol    PPE: Used by the provider:   P100  eye Goggles,  Gloves    Arliss Frisina 11/16/2019, 7:53 PM    [facility]     after 2 AM please page floor coverage PA If 7AM-7PM, please contact the day team taking care of the patient using [email or web address]   Patient was evaluated in the context of the global COVID-19 pandemic, which necessitated consideration that the patient might be at risk for infection with the SARS-CoV-2 virus  that causes COVID-19. Institutional protocols and algorithms that pertain to the evaluation of patients at risk for COVID-19 are in a state of rapid change based on information released by regulatory bodies including the CDC and federal and state organizations. These policies and algorithms were followed during the patient's care.

## 2019-11-17 DIAGNOSIS — J449 Chronic obstructive pulmonary disease, unspecified: Secondary | ICD-10-CM | POA: Diagnosis not present

## 2019-11-17 DIAGNOSIS — R933 Abnormal findings on diagnostic imaging of other parts of digestive tract: Secondary | ICD-10-CM

## 2019-11-17 DIAGNOSIS — K625 Hemorrhage of anus and rectum: Secondary | ICD-10-CM | POA: Diagnosis not present

## 2019-11-17 LAB — COMPREHENSIVE METABOLIC PANEL
ALT: 14 U/L (ref 0–44)
AST: 22 U/L (ref 15–41)
Albumin: 3.5 g/dL (ref 3.5–5.0)
Alkaline Phosphatase: 42 U/L (ref 38–126)
Anion gap: 10 (ref 5–15)
BUN: 12 mg/dL (ref 8–23)
CO2: 23 mmol/L (ref 22–32)
Calcium: 8.9 mg/dL (ref 8.9–10.3)
Chloride: 100 mmol/L (ref 98–111)
Creatinine, Ser: 0.63 mg/dL (ref 0.44–1.00)
GFR, Estimated: >60 mL/min (ref >60)
Glucose, Bld: 84 mg/dL (ref 70–99)
Potassium: 3.8 mmol/L (ref 3.5–5.1)
Sodium: 133 mmol/L — ABNORMAL LOW (ref 135–145)
Total Bilirubin: 0.9 mg/dL (ref 0.3–1.2)
Total Protein: 6.2 g/dL — ABNORMAL LOW (ref 6.5–8.1)

## 2019-11-17 LAB — CBC
HCT: 30.5 % — ABNORMAL LOW (ref 36.0–46.0)
Hemoglobin: 10.1 g/dL — ABNORMAL LOW (ref 12.0–15.0)
MCH: 30.7 pg (ref 26.0–34.0)
MCHC: 33.1 g/dL (ref 30.0–36.0)
MCV: 92.7 fL (ref 80.0–100.0)
Platelets: 323 10*3/uL (ref 150–400)
RBC: 3.29 MIL/uL — ABNORMAL LOW (ref 3.87–5.11)
RDW: 14.8 % (ref 11.5–15.5)
WBC: 7.7 10*3/uL (ref 4.0–10.5)
nRBC: 0 % (ref 0.0–0.2)

## 2019-11-17 LAB — IRON AND TIBC
Iron: 50 ug/dL (ref 28–170)
Saturation Ratios: 16 % (ref 10.4–31.8)
TIBC: 310 ug/dL (ref 250–450)
UIBC: 260 ug/dL

## 2019-11-17 LAB — RETICULOCYTES
Immature Retic Fract: 9.8 % (ref 2.3–15.9)
RBC.: 3.34 MIL/uL — ABNORMAL LOW (ref 3.87–5.11)
Retic Count, Absolute: 43.8 10*3/uL (ref 19.0–186.0)
Retic Ct Pct: 1.3 % (ref 0.4–3.1)

## 2019-11-17 LAB — FERRITIN: Ferritin: 51 ng/mL (ref 11–307)

## 2019-11-17 LAB — VITAMIN B12: Vitamin B-12: 431 pg/mL (ref 180–914)

## 2019-11-17 LAB — FOLATE: Folate: 43.4 ng/mL (ref >5.9)

## 2019-11-17 MED ORDER — LEVOTHYROXINE SODIUM 50 MCG PO TABS
50.0000 ug | ORAL_TABLET | ORAL | Status: DC
  Administered 2019-11-19 – 2019-11-20 (×2): 50 ug via ORAL
  Filled 2019-11-17: qty 1

## 2019-11-17 MED ORDER — LEVOTHYROXINE SODIUM 50 MCG PO TABS: 75.0000 ug | ORAL_TABLET | ORAL | Status: DC

## 2019-11-17 MED ORDER — SODIUM CHLORIDE 0.9 % IV SOLN: INTRAVENOUS | Status: DC

## 2019-11-17 NOTE — Progress Notes (Signed)
PROGRESS NOTE  [name]  DOB: 01/26/1935  PCP: Caren Macadam, MD TMA:[id number]  DOA: 11/16/2019  LOS: 1 day   Chief Complaint  Patient presents with  . Rectal Bleeding  . Fatigue   Brief narrative: [name] is a 85 y.o. female with PMH of HTN, HLD, CKD, diverticulosis, GERD, chronic GI bleeding, chronic anemia, hypothyroidism,COPD, Purpura from steroids (per patient). Patient presented to the ED on 10/9 with 3-day history of lower abdominal pain and bright red blood per rectum.  She always had some blood per rectum for years abut has gotten worse recently. Reported significant generalied fatigue. Not vaccinated against Covid.  In the ED, patient was hemodynamically stable. Labs with hemoglobin 10.7, FOBT positive. CT abdomen and pelvis with inflammatory changes of the rectosigmoid, likely proctitis without evidence of abscess or perforation.  Patient was admitted to hospitalist service for further evaluation management.  Subjective: Patient was seen and examined this morning. Pleasant elderly Caucasian female. Not in distress. Continues to have lower abdominal pain. Chart reviewed. Remains hemodynamically stable. Hemoglobin seems to be trending down.  Assessment/Plan: Acute on chronic lower GI bleeding -Presented with acute worsening of chronic intermittent BRBPR.  -CT scan finding of proctitis and sigmoid diverticulosis.  -Started on IV Zosyn in the ED. -[facility] consulted from ED. -Remains n.p.o. at this time.  Chronic gastroparesis/chronic abdominal pain/chronic diarrhea -Patient reports remote history of laparotomy and colonic resection twice in the past.  -Patient reports chronic gastroparesis, chronic abdominal pain, chronic diarrhea because of which her appetite has been only limited to liquid diet and pured.  Chronic iron deficiency anemia -Hemoglobin fluctuating but remains at baseline. -Continue to monitor. -Continue chronic iron  supplement. Recent Labs    04/30/19 1449 06/03/19 1342 08/08/19 0122 08/08/19 0122 08/09/19 0148 08/09/19 0148 11/16/19 1318 11/16/19 1318 11/16/19 2039 11/17/19 0506  HGB 11.6*   < > 8.9*  --  8.9*  --  10.7*  --  11.0* 10.1*  MCV 95.2   < > 95.5   < > 96.1   < > 93.4   < > 91.3 92.7  VITAMINB12 452  --   --   --   --   --   --   --   --  431  FOLATE  --   --   --   --   --   --   --   --   --  43.4  FERRITIN  --   --   --   --   --   --   --   --   --  51  TIBC  --   --   --   --   --   --   --   --   --  310  IRON  --   --   --   --   --   --   --   --   --  50  RETICCTPCT  --   --   --   --   --   --   --   --   --  1.3   < > = values in this interval not displayed.   GERD/ Moderate-sized hiatal hernia -I do not see any PPI or H2 blocker in her home list of medication.  Hypothyroidism -Continue Synthroid.   Essential hypertension  -not on BP meds at home -Continue to monitor blood pressure.  COPD -Continue bronchodilators.   Not  vaccinated for against COVID infection  -Pt is interested in getting vaccinated while hospitalized.  Mobility: Encourage ambulation Code Status:   Code Status: DNR  Nutritional status: Body mass index is 23.8 kg/m.     Diet Order            Diet clear liquid Room service appropriate? Yes; Fluid consistency: Thin  Diet effective now                 DVT prophylaxis: SCDs Start: 11/16/19 2303   Antimicrobials:  IV Zosyn Fluid: Normal saline at 75 mL/h Consultants: [facility] Family Communication:  None at bedside  Status is: Inpatient  Remains inpatient appropriate because: Of GI bleeding, pending GI evaluation. Needs hemoglobin monitoring  Dispo: The patient is from: ALF              Anticipated d/c is to: ALF              Anticipated d/c date is: 3 days              Patient currently is not medically stable to d/c.     Infusions:  . sodium chloride Stopped (11/16/19 2300)  . sodium chloride 75 mL/hr at 11/16/19  2319  . piperacillin-tazobactam (ZOSYN)  IV Stopped (11/17/19 4081)    Scheduled Meds: . budesonide (PULMICORT) nebulizer solution  0.25 mg Nebulization BID  . [START ON 11/18/2019] levothyroxine  50 mcg Oral Q MTWThF  . levothyroxine  75 mcg Oral Q0600    Antimicrobials: Anti-infectives (From admission, onward)   Start     Dose/Rate Route Frequency Ordered Stop   11/17/19 0200  piperacillin-tazobactam (ZOSYN) IVPB 3.375 g        3.375 g 12.5 mL/hr over 240 Minutes Intravenous Every 8 hours 11/16/19 1837     11/16/19 1815  piperacillin-tazobactam (ZOSYN) IVPB 3.375 g        3.375 g 100 mL/hr over 30 Minutes Intravenous  Once 11/16/19 1814 11/16/19 1856      PRN meds: acetaminophen **OR** acetaminophen, albuterol   Objective: Vitals:   11/17/19 0630 11/17/19 0738  BP: 127/65 138/68  Pulse: 66 75  Resp: 14 (!) 21  Temp:  (!) 97.4 F (36.3 C)  SpO2: 99% 99%    Intake/Output Summary (Last 24 hours) at 11/17/2019 0832 Last data filed at 11/16/2019 1856 Gross per 24 hour  Intake 550 ml  Output --  Net 550 ml   Filed Weights   11/16/19 1259  Weight: 64.9 kg   Weight change:  Body mass index is 23.8 kg/m.   Physical Exam: General exam: Appears calm and comfortable. Not in physical distress Skin: No rashes, lesions or ulcers. HEENT: Atraumatic, normocephalic, supple neck, no obvious bleeding Lungs: Clear to auscultation bilaterally CVS: Regular rate and rhythm, no murmur GI/Abd soft, nondistended, mild chronic diffuse tenderness, bowel sound present CNS: Alert, awake, oriented x3 Psychiatry: Mood appropriate Extremities: No pedal edema, no calf tenderness  Data Review: I have personally reviewed the laboratory data and studies available.  Recent Labs  Lab 11/16/19 1318 11/16/19 2039 11/17/19 0506  WBC 7.9 7.5 7.7  HGB 10.7* 11.0* 10.1*  HCT 32.8* 33.4* 30.5*  MCV 93.4 91.3 92.7  PLT 387 270 323   Recent Labs  Lab 11/16/19 1318 11/17/19 0506  NA  132* 133*  K 4.5 3.8  CL 97* 100  CO2 23 23  GLUCOSE 98 84  BUN 15 12  CREATININE 0.69 0.63  CALCIUM 9.8 8.9  F/u labs ordered  Signed, Terrilee Croak, MD [facility] 11/17/2019

## 2019-11-17 NOTE — Evaluation (Signed)
Physical Therapy Evaluation Patient Details Name: [name] MRN: [id number] DOB: May 11, 1934 Today's Date: 11/17/2019   History of Present Illness  [name] is a 85 y.o. female with a past medical history significant for diverticulitis, anemia, GERD, hyperlipidemia, hypertension, SBO, CKD and COPD, who presented to the ER from her senior living center on 11/16/2019 with a complaint of rectal bleeding for 3 days and some lower abdominal pain  Clinical Impression  [name] is an 85 year old woman with above medical history who presents with generalized weakness and decreased activity tolerance as well as reports of pain in left leg and muscle cramps resulting in a decline in independence with mobility and ADLs. Patient predominantly min guard for transfers at this time but unable to attempt ambulation 2* c/o severe dizziness with standing - BP 154/86. Patient will benefit from skilled PT services to improve deficits while in hospital in order to return home at discharge.     Follow Up Recommendations No PT follow up    Equipment Recommendations  None recommended by PT    Recommendations for Other Services       Precautions / Restrictions Precautions Precautions: Fall Restrictions Weight Bearing Restrictions: No      Mobility  Bed Mobility Overal bed mobility: Needs Assistance Bed Mobility: Supine to Sit;Sit to Supine     Supine to sit: Supervision Sit to supine: Supervision   General bed mobility comments: for safety  Transfers Overall transfer level: Needs assistance Equipment used: None Transfers: Sit to/from Stand Sit to Stand: Min guard         General transfer comment: Steady assist only  Ambulation/Gait             General Gait Details: Pt stood x 2 but unwilling to attempt ambulation 2* c/o severe dizziness - Bp 154/68  Stairs            Wheelchair Mobility    Modified Rankin (Stroke Patients Only)       Balance Overall balance  assessment: Mild deficits observed, not formally tested                                           Pertinent Vitals/Pain Pain Assessment: Faces Faces Pain Scale: Hurts even more Pain Location: Left leg with standing Pain Descriptors / Indicators: Discomfort;Grimacing Pain Intervention(s): Limited activity within patient's tolerance;Monitored during session    Home Living Family/patient expects to be discharged to:: Other (Comment) (Independent living facility)               Home Equipment: Grab bars - tub/shower;Cane - single point;Walker - 2 wheels;Shower seat Additional Comments: Living in senior community. Has life alert button     Prior Function Level of Independence: Independent with assistive device(s)         Comments: Uses cane for ambulation, able to complete BADL/IADL     Hand Dominance        Extremity/Trunk Assessment   Upper Extremity Assessment Upper Extremity Assessment: Overall WFL for tasks assessed    Lower Extremity Assessment Lower Extremity Assessment: Generalized weakness;LLE deficits/detail LLE: Unable to fully assess due to pain    Cervical / Trunk Assessment Cervical / Trunk Assessment: Normal  Communication   Communication: No difficulties  Cognition Arousal/Alertness: Awake/alert Behavior During Therapy: WFL for tasks assessed/performed Overall Cognitive Status: Within Functional Limits for tasks assessed  General Comments      Exercises     Assessment/Plan    PT Assessment Patient needs continued PT services  PT Problem List Decreased strength;Decreased activity tolerance;Decreased balance;Decreased mobility;Decreased knowledge of use of DME;Pain       PT Treatment Interventions DME instruction;Gait training;Functional mobility training;Therapeutic activities;Therapeutic exercise;Balance training;Patient/family education    PT Goals (Current goals  can be found in the Care Plan section)  Acute Rehab PT Goals Patient Stated Goal: Regain IND PT Goal Formulation: With patient Time For Goal Achievement: 12/01/19 Potential to Achieve Goals: Good    Frequency Min 3X/week   Barriers to discharge        Co-evaluation PT/OT/SLP Co-Evaluation/Treatment: Yes Reason for Co-Treatment: To address functional/ADL transfers PT goals addressed during session: Mobility/safety with mobility OT goals addressed during session: ADL's and self-care       AM-PAC PT "6 Clicks" Mobility  Outcome Measure Help needed turning from your back to your side while in a flat bed without using bedrails?: None Help needed moving from lying on your back to sitting on the side of a flat bed without using bedrails?: None Help needed moving to and from a bed to a chair (including a wheelchair)?: A Little Help needed standing up from a chair using your arms (e.g., wheelchair or bedside chair)?: A Little Help needed to walk in hospital room?: A Little Help needed climbing 3-5 steps with a railing? : A Little 6 Click Score: 20    End of Session Equipment Utilized During Treatment: Gait belt Activity Tolerance: Patient limited by fatigue;Other (comment) (c/o dizziness) Patient left: in bed;with call bell/phone within reach Nurse Communication: Mobility status PT Visit Diagnosis: Difficulty in walking, not elsewhere classified (R26.2);Muscle weakness (generalized) (M62.81);Pain Pain - Right/Left: Left Pain - part of body: Leg    Time: 1478-2956 PT Time Calculation (min) (ACUTE ONLY): 17 min   Charges:   PT Evaluation $PT Eval Low Complexity: 1 Low          Mauro Kaufmann PT Acute Rehabilitation Services Pager [phone] Office [phone]   Vinson Tietze 11/17/2019, 4:24 PM

## 2019-11-17 NOTE — Progress Notes (Signed)
Called to get report on patient coming to 1614. I was unable to reach the nurse. I will wait on a return call for report.

## 2019-11-17 NOTE — ED Notes (Signed)
ED TO INPATIENT HANDOFF REPORT  ED Nurse Name and Phone #: [phone]  S Name/Age/Gender [name] 85 y.o. female Room/Bed: WA05/WA05  Code Status   Code Status: DNR  Home/SNF/Other Home Patient oriented to: self, place, time and situation Is this baseline? Yes   Triage Complete: Triage complete  Chief Complaint Lower GI bleed [K92.2]  Triage Note Pt had rectal bleeding for 5 days. Having abd pains. Doesn't take blood thinners.     Allergies Allergies  Allergen Reactions  . [place] Nut (Berthollefia [place]) Skin Test Anaphylaxis  . Fluconazole Shortness Of Breath  . Metronidazole Shortness Of Breath and Nausea And Vomiting  . Pepcid [Famotidine] Shortness Of Breath and Other (See Comments)    Dizziness  . Shellfish Allergy Anaphylaxis  . Macrobid [Nitrofurantoin]     REACTION: Syncope  . Pantoprazole Other (See Comments)    CHEST PAIN    Level of Care/Admitting Diagnosis ED Disposition    ED Disposition Condition Comment   Admit  Hospital Area: [facility] [[id number]]  Level of Care: Telemetry [5]  Admit to tele based on following criteria: Other see comments  Comments: gi bleed  May admit patient to [facility] or [facility] if equivalent level of care is available:: No  Covid Evaluation: Asymptomatic Screening Protocol (No Symptoms)  Diagnosis: Lower GI bleed [275170]  Admitting Physician: Toy Baker [[id number]]  Attending Physician: Toy Baker [[id number]]  Estimated length of stay: 3 - 4 days  Certification:: I certify this patient will need inpatient services for at least 2 midnights       B Medical/Surgery History Past Medical History:  Diagnosis Date  . ABDOMINAL PAIN, CHRONIC 05/28/2008  . ALLERGIC RHINITIS 09/21/2006  . ANEMIA-NOS 09/21/2006  . ASTHMA 09/21/2006  . ASYMPTOMATIC POSTMENOPAUSAL STATUS 05/28/2008  . Cataract   . CHEST PAIN, ATYPICAL 05/28/2008  . COPD (chronic obstructive pulmonary disease) ([place])   . DDD  (degenerative disc disease)   . DIVERTICULITIS, HX OF 09/21/2006  . GERD 09/21/2006  . HYPERCHOLESTEROLEMIA 06/05/2009  . HYPERTENSION 09/21/2006  . HYPOTHYROIDISM 09/21/2006  . Peptic ulcer disease   . Purpura ([place])   . RENAL INSUFFICIENCY 09/21/2006  . Shingles   . Small bowel obstruction ([place])   . Somatization disorder 04/25/2007  . UNSPECIFIED URINARY CALCULUS 05/28/2008  . UTI (urinary tract infection) 07/2019   Past Surgical History:  Procedure Laterality Date  . ABDOMINAL HYSTERECTOMY  1981  . acute Nephritis  1958  . APPENDECTOMY  1959  . CESAREAN SECTION    . CHOLECYSTECTOMY  1983  . COLON SURGERY    . DENTAL SURGERY  1970  . NASAL SINUS SURGERY  1967  . Pulmonary thrombosis    . TONSILLECTOMY    . TUBAL LIGATION       A IV Location/Drains/Wounds Patient Lines/Drains/Airways Status    Active Line/Drains/Airways    Name Placement date Placement time Site Days   Peripheral IV 11/16/19 Left Antecubital 11/16/19  1423  Antecubital  1   Peripheral IV 11/16/19 Anterior;Right Forearm 11/16/19  2044  Forearm  1          Intake/Output Last 24 hours  Intake/Output Summary (Last 24 hours) at 11/17/2019 1554 Last data filed at 11/16/2019 1856 Gross per 24 hour  Intake 550 ml  Output --  Net 550 ml    Labs/Imaging Results for orders placed or performed during the hospital encounter of 11/16/19 (from the past 48 hour(s))  Comprehensive metabolic panel  Status: Abnormal   Collection Time: 11/16/19  1:18 PM  Result Value Ref Range   Sodium 132 (L) 135 - 145 mmol/L   Potassium 4.5 3.5 - 5.1 mmol/L   Chloride 97 (L) 98 - 111 mmol/L   CO2 23 22 - 32 mmol/L   Glucose, Bld 98 70 - 99 mg/dL    Comment: Glucose reference range applies only to samples taken after fasting for at least 8 hours.   BUN 15 8 - 23 mg/dL   Creatinine, Ser 0.69 0.44 - 1.00 mg/dL   Calcium 9.8 8.9 - 10.3 mg/dL   Total Protein 7.3 6.5 - 8.1 g/dL   Albumin 3.9 3.5 - 5.0 g/dL   AST 24 15 - 41 U/L    ALT 16 0 - 44 U/L   Alkaline Phosphatase 47 38 - 126 U/L   Total Bilirubin 0.6 0.3 - 1.2 mg/dL   GFR, Estimated >60 >60 mL/min   Anion gap 12 5 - 15    Comment: Performed at [facility], [place] [address], [place], [place] [address]  CBC     Status: Abnormal   Collection Time: 11/16/19  1:18 PM  Result Value Ref Range   WBC 7.9 4.0 - 10.5 K/uL   RBC 3.51 (L) 3.87 - 5.11 MIL/uL   Hemoglobin 10.7 (L) 12.0 - 15.0 g/dL   HCT 32.8 (L) 36 - 46 %   MCV 93.4 80.0 - 100.0 fL   MCH 30.5 26.0 - 34.0 pg   MCHC 32.6 30.0 - 36.0 g/dL   RDW 15.0 11.5 - 15.5 %   Platelets 387 150 - 400 K/uL   nRBC 0.0 0.0 - 0.2 %    Comment: Performed at [facility], [place] [address] [place], [place] [address]  Type and screen [place]     Status: None   Collection Time: 11/16/19  1:20 PM  Result Value Ref Range   ABO/RH(D) O NEG    Antibody Screen NEG    Sample Expiration      11/19/2019,2359 Performed at [facility], [place] [address], [place], [place] [address]   APTT     Status: None   Collection Time: 11/16/19  2:12 PM  Result Value Ref Range   aPTT 29 24 - 36 seconds    Comment: Performed at [facility], [place] [address] [place], [place] [address]  Protime-INR     Status: None   Collection Time: 11/16/19  2:12 PM  Result Value Ref Range   Prothrombin Time 12.9 11.4 - 15.2 seconds   INR 1.0 0.8 - 1.2    Comment: (NOTE) INR goal varies based on device and disease states. Performed at [facility], [place] [address] [place], [place] [address]   POC occult blood, ED     Status: Abnormal   Collection Time: 11/16/19  2:39 PM  Result Value Ref Range   Fecal Occult Bld POSITIVE (A) NEGATIVE  Urinalysis, Routine w reflex microscopic     Status: Abnormal   Collection Time: 11/16/19  8:16 PM  Result Value Ref Range   Color, Urine STRAW (A) YELLOW   APPearance CLEAR CLEAR   Specific  Gravity, Urine 1.018 1.005 - 1.030   pH 8.0 5.0 - 8.0   Glucose, UA NEGATIVE NEGATIVE mg/dL   Hgb urine dipstick NEGATIVE NEGATIVE   Bilirubin Urine NEGATIVE NEGATIVE   Ketones, ur NEGATIVE NEGATIVE mg/dL   Protein, ur NEGATIVE NEGATIVE  mg/dL   Nitrite NEGATIVE NEGATIVE   Leukocytes,Ua NEGATIVE NEGATIVE    Comment: Performed at [place] [address] [place], [place] [address]  Respiratory Panel by RT PCR (Flu A&B, Covid) -     Status: None   Collection Time: 11/16/19  8:16 PM   Specimen: Nasopharyngeal  Result Value Ref Range   SARS Coronavirus 2 by RT PCR NEGATIVE NEGATIVE    Comment: (NOTE) SARS-CoV-2 target nucleic acids are NOT DETECTED.  The SARS-CoV-2 RNA is generally detectable in upper respiratoy specimens during the acute phase of infection. The lowest concentration of SARS-CoV-2 viral copies this assay can detect is 131 copies/mL. A negative result does not preclude SARS-Cov-2 infection and should not be used as the sole basis for treatment or other patient management decisions. A negative result may occur with  improper specimen collection/handling, submission of specimen other than nasopharyngeal swab, presence of viral mutation(s) within the areas targeted by this assay, and inadequate number of viral copies (<131 copies/mL). A negative result must be combined with clinical observations, patient history, and epidemiological information. The expected result is Negative.  Fact Sheet for Patients:  PinkCheek.be  Fact Sheet for Healthcare Providers:  GravelBags.it  This test is no t yet approved or cleared by the [place] FDA and  has been authorized for detection and/or diagnosis of SARS-CoV-2 by FDA under an Emergency Use Authorization (EUA). This EUA will remain  in effect (meaning this test can be used) for the duration of the COVID-19 declaration under Section 564(b)(1) of  the Act, 21 U.S.C. section 360bbb-3(b)(1), unless the authorization is terminated or revoked sooner.     Influenza A by PCR NEGATIVE NEGATIVE   Influenza B by PCR NEGATIVE NEGATIVE    Comment: (NOTE) The Xpert Xpress SARS-CoV-2/FLU/RSV assay is intended as an aid in  the diagnosis of influenza from Nasopharyngeal swab specimens and  should not be used as a sole basis for treatment. Nasal washings and  aspirates are unacceptable for Xpert Xpress SARS-CoV-2/FLU/RSV  testing.  Fact Sheet for Patients: PinkCheek.be  Fact Sheet for Healthcare Providers: GravelBags.it  This test is not yet approved or cleared by the [place] FDA and  has been authorized for detection and/or diagnosis of SARS-CoV-2 by  FDA under an Emergency Use Authorization (EUA). This EUA will remain  in effect (meaning this test can be used) for the duration of the  Covid-19 declaration under Section 564(b)(1) of the Act, 21  U.S.C. section 360bbb-3(b)(1), unless the authorization is  terminated or revoked. Performed at [facility], [place] [address] [place], [place] [address]   CBC     Status: Abnormal   Collection Time: 11/16/19  8:39 PM  Result Value Ref Range   WBC 7.5 4.0 - 10.5 K/uL   RBC 3.66 (L) 3.87 - 5.11 MIL/uL   Hemoglobin 11.0 (L) 12.0 - 15.0 g/dL   HCT 33.4 (L) 36 - 46 %   MCV 91.3 80.0 - 100.0 fL   MCH 30.1 26.0 - 34.0 pg   MCHC 32.9 30.0 - 36.0 g/dL   RDW 15.0 11.5 - 15.5 %   Platelets 270 150 - 400 K/uL   nRBC 0.0 0.0 - 0.2 %    Comment: Performed at [facility], [place] [address], [place], [place] [address]  Vitamin B12     Status: None   Collection Time: 11/17/19  5:06 AM  Result Value Ref Range   Vitamin B-12 431 180 -  914 pg/mL    Comment: (NOTE) This assay is not validated for testing neonatal or myeloproliferative syndrome specimens for Vitamin B12 levels. Performed at [facility], [place] [address], [place], [place] [address]   Folate     Status: None   Collection Time: 11/17/19  5:06 AM  Result Value Ref Range   Folate 43.4 >5.9 ng/mL    Comment: RESULTS CONFIRMED BY MANUAL DILUTION Performed at [place] [address], [place], [place] [address]   Iron and TIBC     Status: None   Collection Time: 11/17/19  5:06 AM  Result Value Ref Range   Iron 50 28 - 170 ug/dL   TIBC 310 250 - 450 ug/dL   Saturation Ratios 16 10.4 - 31.8 %   UIBC 260 ug/dL    Comment: Performed at [facility], [place] [address] [place], [place] [address]  Ferritin     Status: None   Collection Time: 11/17/19  5:06 AM  Result Value Ref Range   Ferritin 51 11 - 307 ng/mL    Comment: Performed at [facility], [place] [address] [place], [place] [address]  Reticulocytes     Status: Abnormal   Collection Time: 11/17/19  5:06 AM  Result Value Ref Range   Retic Ct Pct 1.3 0.4 - 3.1 %   RBC. 3.34 (L) 3.87 - 5.11 MIL/uL   Retic Count, Absolute 43.8 19.0 - 186.0 K/uL   Immature Retic Fract 9.8 2.3 - 15.9 %    Comment: Performed at [facility], [place] [address] [place], [place] [address]  Comprehensive metabolic panel     Status: Abnormal   Collection Time: 11/17/19  5:06 AM  Result Value Ref Range   Sodium 133 (L) 135 - 145 mmol/L   Potassium 3.8 3.5 - 5.1 mmol/L   Chloride 100 98 - 111 mmol/L   CO2 23 22 - 32 mmol/L   Glucose, Bld 84 70 - 99 mg/dL    Comment: Glucose reference range applies only to samples taken after fasting for at least 8 hours.   BUN 12 8 - 23 mg/dL   Creatinine, Ser 0.63 0.44 - 1.00 mg/dL   Calcium 8.9 8.9 - 10.3 mg/dL   Total Protein 6.2 (L) 6.5 - 8.1 g/dL   Albumin 3.5 3.5 - 5.0 g/dL   AST 22 15 - 41 U/L   ALT 14 0 - 44 U/L   Alkaline Phosphatase 42 38 - 126 U/L   Total Bilirubin 0.9 0.3 - 1.2 mg/dL   GFR, Estimated >60 >60 mL/min   Anion gap 10 5 - 15    Comment:  Performed at [facility], [place] [address], [place], [place] [address]  CBC     Status: Abnormal   Collection Time: 11/17/19  5:06 AM  Result Value Ref Range   WBC 7.7 4.0 - 10.5 K/uL   RBC 3.29 (L) 3.87 - 5.11 MIL/uL   Hemoglobin 10.1 (L) 12.0 - 15.0 g/dL   HCT 30.5 (L) 36 - 46 %   MCV 92.7 80.0 - 100.0 fL   MCH 30.7 26.0 - 34.0 pg   MCHC 33.1 30.0 - 36.0 g/dL   RDW 14.8 11.5 - 15.5 %   Platelets 323 150 - 400 K/uL   nRBC 0.0 0.0 - 0.2 %    Comment: Performed at [facility], [place] [address], [place], [place] [address]   CT ABDOMEN  PELVIS W CONTRAST  Result Date: 11/16/2019 CLINICAL DATA:  85 year old female with lower abdominal pain. History of diverticulitis. EXAM: CT ABDOMEN AND PELVIS WITH CONTRAST TECHNIQUE: Multidetector CT imaging of the abdomen and pelvis was performed using the standard protocol following bolus administration of intravenous contrast. CONTRAST:  124m OMNIPAQUE IOHEXOL 300 MG/ML  SOLN COMPARISON:  CT abdomen pelvis dated 08/06/2019. FINDINGS: Lower chest: The visualized lung bases are clear. There is coronary vascular calcification. No intra-abdominal free air or free fluid. Hepatobiliary: The liver is unremarkable. No intrahepatic biliary ductal dilatation. Cholecystectomy. There is mild dilatation of the common bile duct, likely post cholecystectomy. No retained calcified stone noted in the central CBD. Pancreas: Unremarkable. No pancreatic ductal dilatation or surrounding inflammatory changes. Spleen: Normal in size without focal abnormality. Adrenals/Urinary Tract: The adrenal glands unremarkable. There is no hydronephrosis on either side. There is symmetric enhancement and excretion of contrast by both kidneys. The visualized ureters and urinary bladder appear unremarkable. Stomach/Bowel: There is a moderate size hiatal hernia containing a portion of the stomach. There is postsurgical changes of bowel with anastomotic suture in the  lower abdomen. There is sigmoid diverticulosis. There is diffuse inflammatory changes of the rectosigmoid, likely proctitis. Clinical correlation is recommended. No abscess or perforation. There is moderate stool throughout the colon. No bowel obstruction. Appendectomy. Vascular/Lymphatic: The abdominal aorta and IVC are unremarkable. No portal venous gas. There is no adenopathy. Reproductive: Hysterectomy. Other: Midline vertical anterior pelvic wall incisional scar. Musculoskeletal: Degenerative changes of the spine. No acute osseous pathology. IMPRESSION: 1. Inflammatory changes of the rectosigmoid, likely proctitis. Clinical correlation is recommended. No abscess or perforation. 2. Sigmoid diverticulosis. 3. Moderate size hiatal hernia containing a portion of the stomach. 4. Aortic Atherosclerosis (ICD10-I70.0). Electronically Signed   By: AAnner CreteM.D.   On: 11/16/2019 16:36    Pending Labs Unresulted Labs (From admission, onward)          Start     Ordered   11/18/19 0500  CBC with Differential/Platelet  Tomorrow morning,   R        11/17/19 1219   11/18/19 05701 Basic metabolic panel  Tomorrow morning,   R        11/17/19 1219   11/18/19 0500  Magnesium  Tomorrow morning,   STAT        11/17/19 1219   11/18/19 0500  Phosphorus  Tomorrow morning,   R        11/17/19 1219          Vitals/Pain Today's Vitals   11/17/19 1035 11/17/19 1235 11/17/19 1500 11/17/19 1554  BP:  128/60 129/84   Pulse:  69 65   Resp:  17 20   Temp:      TempSrc:      SpO2:  99% 97%   Weight:      Height:      PainSc: 7    4     Isolation Precautions No active isolations  Medications Medications  sodium chloride (PF) 0.9 % injection (has no administration in time range)  piperacillin-tazobactam (ZOSYN) IVPB 3.375 g (3.375 g Intravenous New Bag/Given 11/17/19 1024)  levothyroxine (SYNTHROID) tablet 50 mcg (has no administration in time range)  levothyroxine (SYNTHROID) tablet 75 mcg (75  mcg Oral Given 11/17/19 0653)  albuterol (VENTOLIN HFA) 108 (90 Base) MCG/ACT inhaler 2 puff (has no administration in time range)  0.9 %  sodium chloride infusion ( Intravenous Not Given 11/17/19 1022)  acetaminophen (TYLENOL) tablet 650 mg (  650 mg Oral Given 11/17/19 1035)    Or  acetaminophen (TYLENOL) suppository 650 mg ( Rectal See Alternative 11/17/19 1035)  budesonide (PULMICORT) nebulizer solution 0.25 mg (0.25 mg Nebulization Not Given 11/17/19 0753)  sodium chloride 0.9 % bolus 500 mL (0 mLs Intravenous Stopped 11/16/19 1558)  iohexol (OMNIPAQUE) 300 MG/ML solution 100 mL (100 mLs Intravenous Contrast Given 11/16/19 1541)  piperacillin-tazobactam (ZOSYN) IVPB 3.375 g (0 g Intravenous Stopped 11/16/19 1856)    Mobility walks Low fall risk   Focused Assessments .   R Recommendations: See Admitting Provider Note  Report given to:   Additional Notes: n/a

## 2019-11-17 NOTE — Evaluation (Signed)
Occupational Therapy Evaluation Patient Details Name: LIAHNA SALIDO MRN: [id number] DOB: [birth year] Today's Date: 11/17/2019    History of Present Illness AUDRI NORIS is a 85 y.o. female with a past medical history significant for diverticulitis, anemia, GERD, hyperlipidemia, hypertension, SBO, CKD and COPD, who presented to the ER from her senior living center on 11/16/2019 with a complaint of rectal bleeding for 3 days and some lower abdominal pain   Clinical Impression   Mrs. Chenoa Warnecke is an 85 year old woman with above medical history who presents with generalized weakness and decreased activity tolerance as well as reports of pain in left leg and muscle cramps resulting in a decline in independence with mobility and ADLs. Patient predominantly min guard and supervision for transfers and ADLs at this time due to patient's reports of dizziness with standing. Patient not orthostatic. Patient will benefit from skilled OT services to improve deficits while in hospital in order to return home at discharge.     Follow Up Recommendations  No OT follow up    Equipment Recommendations  None recommended by OT    Recommendations for Other Services       Precautions / Restrictions Precautions Precautions: Fall Restrictions Weight Bearing Restrictions: No      Mobility Bed Mobility Overal bed mobility: Needs Assistance Bed Mobility: Supine to Sit;Sit to Supine     Supine to sit: Supervision Sit to supine: Supervision      Transfers Overall transfer level: Needs assistance Equipment used: None Transfers: Sit to/from Stand Sit to Stand: Min guard         General transfer comment: Ambulation limited at this time due to patient's complaints of LLE pain and dizziness with standing. BP monitired and WFL    Balance Overall balance assessment: Mild deficits observed, not formally tested                                         ADL either performed or assessed  with clinical judgement   ADL Overall ADL's : Needs assistance/impaired Eating/Feeding: Independent   Grooming: Independent   Upper Body Bathing: Supervision/ safety;Set up   Lower Body Bathing: Supervison/ safety;Sitting/lateral leans;Set up   Upper Body Dressing : Supervision/safety   Lower Body Dressing: Min guard;Sit to/from stand Lower Body Dressing Details (indicate cue type and reason): Patient able to pull up socks and put on pseudo underwear with min guard Toilet Transfer: Min Barrister's clerk Details (indicate cue type and reason): Simulated with taking steps to head of bed - limited activity tolerance - min guard Toileting- Clothing Manipulation and Hygiene: Min guard;Sit to/from stand               Vision Patient Visual Report: No change from baseline       Perception     Praxis      Pertinent Vitals/Pain Pain Assessment: Faces Faces Pain Scale: Hurts little more Pain Location: Left leg with standing Pain Descriptors / Indicators: Discomfort;Grimacing Pain Intervention(s): Limited activity within patient's tolerance     Hand Dominance     Extremity/Trunk Assessment Upper Extremity Assessment Upper Extremity Assessment: Overall WFL for tasks assessed   Lower Extremity Assessment Lower Extremity Assessment: Defer to PT evaluation   Cervical / Trunk Assessment Cervical / Trunk Assessment: Normal   Communication Communication Communication: No difficulties   Cognition Arousal/Alertness: Awake/alert Behavior During Therapy: WFL for tasks assessed/performed  Overall Cognitive Status: Within Functional Limits for tasks assessed                                     General Comments       Exercises     Shoulder Instructions      Home Living Family/patient expects to be discharged to:: Independent Living                             Home Equipment: Grab bars - tub/shower;Cane - single point;Walker  - 2 wheels;Shower seat   Additional Comments: Living in senior community. Has life alert button       Prior Functioning/Environment Level of Independence: Independent with assistive device(s)        Comments: Uses cane for ambulation, able to complete BADL/IADL        OT Problem List: Decreased activity tolerance;Pain      OT Treatment/Interventions: Self-care/ADL training;Patient/family education;Therapeutic exercise;DME and/or AE instruction;Therapeutic activities    OT Goals(Current goals can be found in the care plan section) Acute Rehab OT Goals Patient Stated Goal: Did not state OT Goal Formulation: With patient Time For Goal Achievement: 12/01/19 Potential to Achieve Goals: Good  OT Frequency: Min 2X/week   Barriers to D/C:            Co-evaluation PT/OT/SLP Co-Evaluation/Treatment: Yes Reason for Co-Treatment: To address functional/ADL transfers PT goals addressed during session: Mobility/safety with mobility OT goals addressed during session: ADL's and self-care      AM-PAC OT "6 Clicks" Daily Activity     Outcome Measure Help from another person eating meals?: None Help from another person taking care of personal grooming?: None Help from another person toileting, which includes using toliet, bedpan, or urinal?: A Little Help from another person bathing (including washing, rinsing, drying)?: A Little Help from another person to put on and taking off regular upper body clothing?: A Little Help from another person to put on and taking off regular lower body clothing?: A Little 6 Click Score: 20   End of Session Nurse Communication: Mobility status  Activity Tolerance: Patient limited by fatigue Patient left: in bed;with call bell/phone within reach  OT Visit Diagnosis: Muscle weakness (generalized) (M62.81)                Time: 1610-9604 OT Time Calculation (min): 18 min Charges:  OT General Charges $OT Visit: 1 Visit OT Evaluation $OT Eval Low  Complexity: 1 Low  Zaneta Lightcap, OTR/L Acute Care Rehab Services  Office [phone] Pager: [phone]   Kelli Churn 11/17/2019, 4:15 PM

## 2019-11-17 NOTE — Consult Note (Signed)
Consultation  Referring Provider: Dr. Pietro Cassis     Primary Care Physician:  Caren Macadam, MD Primary Gastroenterologist: Dr. Silverio Decamp         Reason for Consultation: Abdominal pain            HPI:   [name] is a 85 y.o. female with a past medical history significant for diverticulitis, anemia, GERD, hyperlipidemia, hypertension, SBO, CKD and COPD, who [facility] the [facility] on [facility]/2019 with a complaint of rectal bleeding for 3 days and some lower abdominal pain.    Today, the patient tells me that after taking the Augmentin for 10 days she had complete resolution of her abdominal pain for about 48 hours, but then started seeing some more bright red blood drops from her rectum after having her bowel movements, the blood started to increase in amount by day and she also started with increasing abdominal pain/rectal pressure which increased into yesterday when she was just "spurting bright red blood" when she would sit on the commode.  Tells me the weird thing was that the bowel movements were normal but at the end she would wipe and see a lot of blood or she would have some blood at the end of the bowel movement.  Also describes some "green pus" initially which seemed to resolve.  She went on a mostly clear liquid diet and this seemed to be easing the discomfort, but apparently called our office and was told to try and increase this back and when she did it made everything worse.  Does describe some dizziness.    Describes a bad history with colonoscopy, apparently her first 1 ended in a perforation, though this was thought due to some leftover staples from a lap cholecystectomy she had had years ago, she ended up having some of her colon removed at that time.  She did have a repeat in 2015 which was successful and uneventful, but due to this she is nervous about any endoscopic work-up.    Denies fever, chills or weight loss.  ER course: Hemoglobin up from  baseline to 10.7, Hemoccult positive, CT showed proctitis, patient started on Zosyn  GI history: 10/31/2019 office visit with Alonza Bogus for left lower quadrant pain: At that time noted a CT scan in April that suggested diverticulitis treated with Augmentin, course had to be extended but her symptoms ultimately resolved, also treated for rectal bleeding is thought due to an inflamed internal hemorrhoid seen on anoscopy at one of her visits, at that time returned with complaints of left lower quadrant pain over the past 3 days also seen some bright red blood and mucus in her stool; plan: Started Augmentin 875 mg twice daily x10 days 08/15/2013 colonoscopy Dr. Deatra Ina -abnormal mucosa in the rectal vault-rule out adenomatous changes versus proctitis - a source for Hemoccult-positive(pathology negative) stool -diverticulosis 08/15/2013 EGD Dr. Deatra Ina 1. esophageal stricture 2. Gastritis  Past Medical History:  Diagnosis Date  . ABDOMINAL PAIN, CHRONIC 05/28/2008  . ALLERGIC RHINITIS 09/21/2006  . ANEMIA-NOS 09/21/2006  . ASTHMA 09/21/2006  . ASYMPTOMATIC POSTMENOPAUSAL STATUS 05/28/2008  . Cataract   . CHEST PAIN, ATYPICAL 05/28/2008  . COPD (chronic obstructive pulmonary disease) ([place])   . DDD (degenerative disc disease)   . DIVERTICULITIS, HX OF 09/21/2006  . GERD 09/21/2006  . HYPERCHOLESTEROLEMIA 06/05/2009  . HYPERTENSION 09/21/2006  . HYPOTHYROIDISM 09/21/2006  . Peptic ulcer disease   . Purpura ([place])   . RENAL  INSUFFICIENCY 09/21/2006  . Shingles   . Small bowel obstruction ([place])   . Somatization disorder 04/25/2007  . UNSPECIFIED URINARY CALCULUS 05/28/2008  . UTI (urinary tract infection) 07/2019    Past Surgical History:  Procedure Laterality Date  . ABDOMINAL HYSTERECTOMY  1981  . acute Nephritis  1958  . APPENDECTOMY  1959  . CESAREAN SECTION    . CHOLECYSTECTOMY  1983  . COLON SURGERY    . DENTAL SURGERY  1970  . NASAL SINUS SURGERY  1967  . Pulmonary thrombosis    .  TONSILLECTOMY    . TUBAL LIGATION      Family History  Problem Relation Age of Onset  . Polycystic kidney disease Mother   . Pancreatic cancer Mother   . Other Father        Schamberg disease  . Bladder Cancer Father   . Hypertension Father   . Marfan syndrome Son   . Hemochromatosis Son   . Cirrhosis Son   . Allergic rhinitis Sister   . Other Brother        bone issue as child; multiple fractures but seemed to age out of this  . Hemochromatosis Cousin   . Arthritis Sister      Social History   Tobacco Use  . Smoking status: Never Smoker  . Smokeless tobacco: Never Used  Vaping Use  . Vaping Use: Never used  Substance Use Topics  . Alcohol use: No  . Drug use: No    Prior to Admission medications   Medication Sig Start Date End Date Taking? Authorizing Provider  acetaminophen (TYLENOL) 325 MG tablet Take 975 mg by mouth daily. Take 3 tablets (975 mg) daily   Yes [provider]  Cholecalciferol (VITAMIN D3 PO) Take 1 tablet by mouth 2 (two) times a week. Saturday and Sunday   Yes [provider]  Ferrous Sulfate (IRON) 325 (65 FE) MG TABS Take 1 tablet by mouth daily.    Yes [provider]  fluticasone (FLOVENT HFA) 110 MCG/ACT inhaler TAKE 1 PUFF BY MOUTH TWICE A DAY 10/02/19  Yes Caren Macadam, MD  levothyroxine (SYNTHROID) 50 MCG tablet TAKE 1 TABLET BY MOUTH EVERY DAY Patient taking differently: Take 50 mcg by mouth every Monday, Tuesday, Wednesday, Thursday, and Friday.  12/26/18  Yes Koberlein, Steele Berg, MD  levothyroxine (SYNTHROID) 75 MCG tablet TAKE BY MOUTH 2 DAYS PER WEEK Patient taking differently: Take 75 mcg by mouth as directed. Take by mouth 2 days per week (Sat & Sun) 11/04/19  Yes Koberlein, Junell C, MD  senna-docusate (SENOKOT-S) 8.6-50 MG tablet Take 1 tablet by mouth at bedtime as needed for mild constipation. 08/09/19  Yes Hosie Poisson, MD  Spacer/Aero-Holding Chambers (E-Z SPACER) inhaler Use as instructed 11/08/17    Caren Macadam, MD    Current Facility-Administered Medications  Medication Dose Route Frequency Provider Last Rate Last Admin  . acetaminophen (TYLENOL) tablet 650 mg  650 mg Oral Q6H PRN Toy Baker, MD   650 mg at 11/17/19 1035   Or  . acetaminophen (TYLENOL) suppository 650 mg  650 mg Rectal Q6H PRN Doutova, Anastassia, MD      . albuterol (VENTOLIN HFA) 108 (90 Base) MCG/ACT inhaler 2 puff  2 puff Inhalation Q4H PRN Doutova, Anastassia, MD      . budesonide (PULMICORT) nebulizer solution 0.25 mg  0.25 mg Nebulization BID Doutova, Anastassia, MD   0.25 mg at 11/17/19 0031  . [START ON 11/18/2019] levothyroxine (SYNTHROID) tablet  50 mcg  50 mcg Oral Q MTWThF Doutova, Anastassia, MD      . levothyroxine (SYNTHROID) tablet 75 mcg  75 mcg Oral Q0600 Toy Baker, MD   75 mcg at 11/17/19 0653  . piperacillin-tazobactam (ZOSYN) IVPB 3.375 g  3.375 g Intravenous Q8H Doutova, Anastassia, MD 12.5 mL/hr at 11/17/19 1024 3.375 g at 11/17/19 1024   Current Outpatient Medications  Medication Sig Dispense Refill  . acetaminophen (TYLENOL) 325 MG tablet Take 975 mg by mouth daily. Take 3 tablets (975 mg) daily    . Cholecalciferol (VITAMIN D3 PO) Take 1 tablet by mouth 2 (two) times a week. Saturday and Sunday    . Ferrous Sulfate (IRON) 325 (65 FE) MG TABS Take 1 tablet by mouth daily.     . fluticasone (FLOVENT HFA) 110 MCG/ACT inhaler TAKE 1 PUFF BY MOUTH TWICE A DAY 1 each 2  . levothyroxine (SYNTHROID) 50 MCG tablet TAKE 1 TABLET BY MOUTH EVERY DAY (Patient taking differently: Take 50 mcg by mouth every Monday, Tuesday, Wednesday, Thursday, and Friday. ) 90 tablet 1  . levothyroxine (SYNTHROID) 75 MCG tablet TAKE BY MOUTH 2 DAYS PER WEEK (Patient taking differently: Take 75 mcg by mouth as directed. Take by mouth 2 days per week (Sat & Sun)) 26 tablet 0  . senna-docusate (SENOKOT-S) 8.6-50 MG tablet Take 1 tablet by mouth at bedtime as needed for mild constipation.    Marland Kitchen  Spacer/Aero-Holding Chambers (E-Z SPACER) inhaler Use as instructed 1 each 2    Allergies as of 11/16/2019 - Review Complete 11/16/2019  Allergen Reaction Noted  . [place] nut (berthollefia excelsa) skin test Anaphylaxis 06/04/2019  . Fluconazole Shortness Of Breath 02/23/2017  . Metronidazole Shortness Of Breath and Nausea And Vomiting 10/07/2013  . Pepcid [famotidine] Shortness Of Breath and Other (See Comments) 10/07/2013  . Shellfish allergy Anaphylaxis 06/04/2019  . Macrobid [nitrofurantoin]    . Pantoprazole Other (See Comments) 10/30/2013     Review of Systems:    Constitutional: No weight loss, fever or chills Skin: No rash Cardiovascular: No chest pain Respiratory: No SOB  Gastrointestinal: See HPI and otherwise negative Genitourinary: No dysuria  Neurological: No headache, dizziness or syncope Musculoskeletal: No new muscle or joint pain Hematologic: No bruising Psychiatric: No history of depression or anxiety     Physical Exam:  Vital signs in last 24 hours: Temp:  [97.4 F (36.3 C)-98 F (36.7 C)] 97.4 F (36.3 C) (10/10 0738) Pulse Rate:  [60-80] 69 (10/10 0932) Resp:  [10-23] 20 (10/10 0932) BP: (112-150)/(60-119) 112/74 (10/10 0932) SpO2:  [95 %-100 %] 96 % (10/10 0932) Weight:  [64.9 kg] 64.9 kg (10/09 1259)   General:   Pleasant Caucasian female appears to be in NAD, Well developed, Well nourished, alert and cooperative Head:  Normocephalic and atraumatic. Eyes:   PEERL, EOMI. No icterus. Conjunctiva pink. Ears:  Normal auditory acuity. Neck:  Supple Throat: Oral cavity and pharynx without inflammation, swelling or lesion.  Lungs: Respirations even and unlabored. Lungs clear to auscultation bilaterally.   No wheezes, crackles, or rhonchi.  Heart: Normal S1, S2. No MRG. Regular rate and rhythm. No peripheral edema, cyanosis or pallor.  Abdomen:  Soft, nondistended, Mild b/l lower abdominal ttp. No rebound or guarding. Normal bowel sounds. No appreciable  masses or hepatomegaly. Rectal:  Not performed. Wick in place for urine- no obvious bleeding on bed sheet when observed Msk:  Symmetrical without gross deformities. Peripheral pulses intact.  Extremities:  Without edema, no deformity or joint  abnormality.  Neurologic:  Alert and  oriented x4;  grossly normal neurologically. .  Skin:   Dry and intact without significant lesions or rashes. Psychiatric: Demonstrates good judgement and reason without abnormal affect or behaviors.   LAB RESULTS: Recent Labs    11/16/19 1318 11/16/19 2039 11/17/19 0506  WBC 7.9 7.5 7.7  HGB 10.7* 11.0* 10.1*  HCT 32.8* 33.4* 30.5*  PLT 387 270 323   BMET Recent Labs    11/16/19 1318 11/17/19 0506  NA 132* 133*  K 4.5 3.8  CL 97* 100  CO2 23 23  GLUCOSE 98 84  BUN 15 12  CREATININE 0.69 0.63  CALCIUM 9.8 8.9   LFT Recent Labs    11/17/19 0506  PROT 6.2*  ALBUMIN 3.5  AST 22  ALT 14  ALKPHOS 42  BILITOT 0.9   PT/INR Recent Labs    11/16/19 1412  LABPROT 12.9  INR 1.0    STUDIES: CT ABDOMEN PELVIS W CONTRAST  Result Date: 11/16/2019 CLINICAL DATA:  85 year old female with lower abdominal pain. History of diverticulitis. EXAM: CT ABDOMEN AND PELVIS WITH CONTRAST TECHNIQUE: Multidetector CT imaging of the abdomen and pelvis was performed using the standard protocol following bolus administration of intravenous contrast. CONTRAST:  148m OMNIPAQUE IOHEXOL 300 MG/ML  SOLN COMPARISON:  CT abdomen pelvis dated 08/06/2019. FINDINGS: Lower chest: The visualized lung bases are clear. There is coronary vascular calcification. No intra-abdominal free air or free fluid. Hepatobiliary: The liver is unremarkable. No intrahepatic biliary ductal dilatation. Cholecystectomy. There is mild dilatation of the common bile duct, likely post cholecystectomy. No retained calcified stone noted in the central CBD. Pancreas: Unremarkable. No pancreatic ductal dilatation or surrounding inflammatory changes.  Spleen: Normal in size without focal abnormality. Adrenals/Urinary Tract: The adrenal glands unremarkable. There is no hydronephrosis on either side. There is symmetric enhancement and excretion of contrast by both kidneys. The visualized ureters and urinary bladder appear unremarkable. Stomach/Bowel: There is a moderate size hiatal hernia containing a portion of the stomach. There is postsurgical changes of bowel with anastomotic suture in the lower abdomen. There is sigmoid diverticulosis. There is diffuse inflammatory changes of the rectosigmoid, likely proctitis. Clinical correlation is recommended. No abscess or perforation. There is moderate stool throughout the colon. No bowel obstruction. Appendectomy. Vascular/Lymphatic: The abdominal aorta and IVC are unremarkable. No portal venous gas. There is no adenopathy. Reproductive: Hysterectomy. Other: Midline vertical anterior pelvic wall incisional scar. Musculoskeletal: Degenerative changes of the spine. No acute osseous pathology. IMPRESSION: 1. Inflammatory changes of the rectosigmoid, likely proctitis. Clinical correlation is recommended. No abscess or perforation. 2. Sigmoid diverticulosis. 3. Moderate size hiatal hernia containing a portion of the stomach. 4. Aortic Atherosclerosis (ICD10-I70.0). Electronically Signed   By: AAnner CreteM.D.   On: 11/16/2019 16:36     Impression / Plan:   Impression: 1.  Lower GI bleed: Rectal bleeding x6 days, CT with inflammatory changes of the rectosigmoid thought to be proctitis, currently on Zosyn, last colonoscopy 2015; consider most likely proctitis 2.  GERD: Controlled on a PPI  Plan: 1.  Continue to monitor hemoglobin with transfusion as needed less than 7 2.  Discussed a flex sigmoidoscopy with the patient today for further evaluation of her proctitis.  She is very reluctant to undergo any endoscopic work-up as she has had trouble with these procedures in the past.  Explained that this would help  [place] to stop the bleeding and discover the source to be able to treat her better.  She is willing to proceed if we feel it is absolutely necessary. 3.  Could possibly benefit from some steroid suppositories if she truly does not want to undergo sigmoidoscopy. 4.  Please await any further recommendations from Dr. Havery Moros later today  Thank you for your kind consultation, we will continue to follow.  Lavone Nian Nadia Viar  11/17/2019, 12:15 PM

## 2019-11-18 ENCOUNTER — Inpatient Hospital Stay (HOSPITAL_COMMUNITY): Payer: Medicare Other | Admitting: Anesthesiology

## 2019-11-18 ENCOUNTER — Encounter (HOSPITAL_COMMUNITY)
Admission: EM | Disposition: A | Discharge status: Home Health | Payer: Self-pay | Source: Home / Self Care | Attending: Internal Medicine

## 2019-11-18 ENCOUNTER — Encounter (HOSPITAL_COMMUNITY): Payer: Self-pay | Admitting: Internal Medicine

## 2019-11-18 DIAGNOSIS — R933 Abnormal findings on diagnostic imaging of other parts of digestive tract: Secondary | ICD-10-CM | POA: Diagnosis not present

## 2019-11-18 HISTORY — PX: FLEXIBLE SIGMOIDOSCOPY: SHX5431

## 2019-11-18 HISTORY — PX: BIOPSY: SHX5522

## 2019-11-18 LAB — CBC WITH DIFFERENTIAL/PLATELET
Abs Immature Granulocytes: 0.01 10*3/uL (ref 0.00–0.07)
Basophils Absolute: 0.1 10*3/uL (ref 0.0–0.1)
Basophils Relative: 2 %
Eosinophils Absolute: 1.4 10*3/uL — ABNORMAL HIGH (ref 0.0–0.5)
Eosinophils Relative: 21 %
HCT: 30.9 % — ABNORMAL LOW (ref 36.0–46.0)
Hemoglobin: 10.1 g/dL — ABNORMAL LOW (ref 12.0–15.0)
Immature Granulocytes: 0 %
Lymphocytes Relative: 22 %
Lymphs Abs: 1.4 10*3/uL (ref 0.7–4.0)
MCH: 30.7 pg (ref 26.0–34.0)
MCHC: 32.7 g/dL (ref 30.0–36.0)
MCV: 93.9 fL (ref 80.0–100.0)
Monocytes Absolute: 1.2 10*3/uL — ABNORMAL HIGH (ref 0.1–1.0)
Monocytes Relative: 18 %
Neutro Abs: 2.6 10*3/uL (ref 1.7–7.7)
Neutrophils Relative %: 37 %
Platelets: 330 10*3/uL (ref 150–400)
RBC: 3.29 MIL/uL — ABNORMAL LOW (ref 3.87–5.11)
RDW: 14.9 % (ref 11.5–15.5)
WBC: 6.6 10*3/uL (ref 4.0–10.5)
nRBC: 0 % (ref 0.0–0.2)

## 2019-11-18 LAB — MAGNESIUM: Magnesium: 2 mg/dL (ref 1.7–2.4)

## 2019-11-18 LAB — BASIC METABOLIC PANEL
Anion gap: 8 (ref 5–15)
BUN: 9 mg/dL (ref 8–23)
CO2: 23 mmol/L (ref 22–32)
Calcium: 9.1 mg/dL (ref 8.9–10.3)
Chloride: 103 mmol/L (ref 98–111)
Creatinine, Ser: 0.54 mg/dL (ref 0.44–1.00)
GFR, Estimated: >60 mL/min (ref >60)
Glucose, Bld: 87 mg/dL (ref 70–99)
Potassium: 4 mmol/L (ref 3.5–5.1)
Sodium: 134 mmol/L — ABNORMAL LOW (ref 135–145)

## 2019-11-18 LAB — PHOSPHORUS: Phosphorus: 2.9 mg/dL (ref 2.5–4.6)

## 2019-11-18 SURGERY — SIGMOIDOSCOPY, FLEXIBLE
Anesthesia: Monitor Anesthesia Care

## 2019-11-18 MED ORDER — PROPOFOL 10 MG/ML IV BOLUS
INTRAVENOUS | Status: DC | PRN
  Administered 2019-11-18 (×2): 10 mg via INTRAVENOUS

## 2019-11-18 MED ORDER — METHOCARBAMOL 500 MG PO TABS
500.0000 mg | ORAL_TABLET | Freq: Once | ORAL | Status: DC
  Filled 2019-11-18: qty 1

## 2019-11-18 MED ORDER — PROPOFOL 500 MG/50ML IV EMUL
INTRAVENOUS | Status: AC
  Filled 2019-11-18: qty 50

## 2019-11-18 MED ORDER — TRAMADOL HCL 50 MG PO TABS
50.0000 mg | ORAL_TABLET | Freq: Once | ORAL | Status: DC
  Filled 2019-11-18: qty 1

## 2019-11-18 MED ORDER — LIDOCAINE 2% (20 MG/ML) 5 ML SYRINGE
INTRAMUSCULAR | Status: DC | PRN
  Administered 2019-11-18: 40 mg via INTRAVENOUS

## 2019-11-18 MED ORDER — PROPOFOL 500 MG/50ML IV EMUL
INTRAVENOUS | Status: DC | PRN
  Administered 2019-11-18: 75 ug/kg/min via INTRAVENOUS

## 2019-11-18 MED ORDER — LACTATED RINGERS IV SOLN: INTRAVENOUS | Status: DC | PRN

## 2019-11-18 NOTE — Anesthesia Procedure Notes (Signed)
Procedure Name: MAC Date/Time: 11/18/2019 11:17 AM Performed by: West Pugh, CRNA Pre-anesthesia Checklist: Patient identified, Emergency Drugs available, Suction available, Patient being monitored and Timeout performed Patient Re-evaluated:Patient Re-evaluated prior to induction Oxygen Delivery Method: Simple face mask Preoxygenation: Pre-oxygenation with 100% oxygen Induction Type: IV induction Placement Confirmation: positive ETCO2 Dental Injury: Teeth and Oropharynx as per pre-operative assessment

## 2019-11-18 NOTE — Anesthesia Postprocedure Evaluation (Signed)
Anesthesia Post Note  Patient: [name]  Procedure(s) Performed: FLEXIBLE SIGMOIDOSCOPY (N/A ) BIOPSY     Patient location during evaluation: PACU Anesthesia Type: MAC Level of consciousness: awake and alert Pain management: pain level controlled Vital Signs Assessment: post-procedure vital signs reviewed and stable Respiratory status: spontaneous breathing, nonlabored ventilation, respiratory function stable and patient connected to nasal cannula oxygen Cardiovascular status: stable and blood pressure returned to baseline Postop Assessment: no apparent nausea or vomiting Anesthetic complications: no   No complications documented.  Last Vitals:  Vitals:   11/18/19 1200 11/18/19 1210  BP: (!) 154/61 133/72  Pulse: 73 66  Resp: 20 16  Temp:    SpO2: 100% 99%    Last Pain:  Vitals:   11/18/19 1210  TempSrc:   PainSc: 0-No pain                 Effie Berkshire

## 2019-11-18 NOTE — Transfer of Care (Signed)
Immediate Anesthesia Transfer of Care Note  Patient: [name]  Procedure(s) Performed: FLEXIBLE SIGMOIDOSCOPY (N/A ) BIOPSY  Patient Location: PACU and Endoscopy Unit  Anesthesia Type:MAC  Level of Consciousness: awake, drowsy and patient cooperative  Airway & Oxygen Therapy: Patient Spontanous Breathing and Patient connected to face mask oxygen  Post-op Assessment: Report given to RN and Post -op Vital signs reviewed and stable  Post vital signs: Reviewed and stable  Last Vitals:  Vitals Value Taken Time  BP    Temp    Pulse    Resp    SpO2      Last Pain:  Vitals:   11/18/19 1028  TempSrc: Oral  PainSc: 0-No pain         Complications: No complications documented.

## 2019-11-18 NOTE — Op Note (Addendum)
[facility] Patient Name: [name] Procedure Date: 11/18/2019 MRN: [id number] Attending MD: Ladene Artist , MD Date of Birth: 11/12/1934 CSN: [id number] Age: 85 Admit Type: Outpatient Procedure:                Flexible Sigmoidoscopy Indications:              Abdominal pain in the left lower quadrant,                            Hematochezia, Abnormal CT of the GI tract                            (rectosigmoid colon) Providers:                Pricilla Riffle. Fuller Plan, MD, Wynonia Sours, RN, Elspeth Cho Tech., Technician, Danley Danker, CRNA Referring MD:             [facility] Medicines:                See the Anesthesia note for documentation of the                            administered medications Complications:            No immediate complications. Estimated Blood Loss:     Estimated blood loss was minimal. Procedure:                Pre-Anesthesia Assessment:                           - Prior to the procedure, a History and Physical                            was performed, and patient medications and                            allergies were reviewed. The patient's tolerance of                            previous anesthesia was also reviewed. The risks                            and benefits of the procedure and the sedation                            options and risks were discussed with the patient.                            All questions were answered, and informed consent                            was obtained. Prior Anticoagulants: The patient has  taken no previous anticoagulant or antiplatelet                            agents. ASA Grade Assessment: III - A patient with                            severe systemic disease. After reviewing the risks                            and benefits, the patient was deemed in                            satisfactory condition to undergo the procedure.                            After obtaining informed consent, the scope was                            passed under direct vision. The PCF-H190DL                            ([id number] Olympus pediatric colonscope was                            introduced through the anus and advanced to the the                            sigmoid colon. The flexible sigmoidoscopy was                            accomplished without difficulty. The patient                            tolerated the procedure well. The quality of the                            bowel preparation was unsatisfactory above 35 cm                            and fair below 35 cm. Scope In: 11:28:45 AM Scope Out: 11:38:24 AM Total Procedure Duration: 0 hours 9 minutes 39 seconds  Findings:      The perianal and digital rectal examinations were normal.      Multiple small-mouthed diverticula were found in the sigmoid colon.       There was narrowing of the colon in association with the diverticular       opening. There was evidence of diverticular spasm. There was no evidence       of diverticular bleeding. Encountered a large volume of solid stool at       35 cm which was the extent of the exam. Loose stool below 35 cm was       lavaged and suctioned.      Diffuse moderate inflammation characterized by altered vascularity,       congestion (edema), friability and granularity was found in the rectum  and in the sigmoid colon from 20 cm to the anal verge concerning for       ulcerative proctosigmoiditis. Biopsies were taken with a cold forceps       for histology.      The exam was otherwise without abnormality. Impression:               - Preparation of the colon was unsatisfactory above                            35 cm and fair below 35 cm.                           - Moderate diverticulosis in the sigmoid colon.                            There was narrowing of the colon in association                            with the diverticular opening. There was  evidence                            of diverticular spasm. There was no evidence of                            diverticular bleeding.                           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 Biopsied.                           - The examination was otherwise normal. Moderate Sedation:      Not Applicable - Patient had care per Anesthesia. Recommendation:           - Return patient to hospital ward for ongoing care.                           - Resume prior diet.                           - Await pathology results. Procedure Code(s):        --- Professional ---                           [phone], Sigmoidoscopy, flexible; with biopsy, single  or multiple Diagnosis Code(s):        --- Professional ---                           K63.89, Other specified diseases of intestine                           R10.32, Left lower quadrant pain                           K92.1, Melena (includes Hematochezia)                           K57.30, Diverticulosis of large intestine without                            perforation or abscess without bleeding                           R93.3, Abnormal findings on diagnostic imaging of                            other parts of digestive tract CPT copyright 2019 American Medical Association. All rights reserved. The codes documented in this report are preliminary and upon coder review may  be revised to meet current compliance requirements. Ladene Artist, MD 11/18/2019 11:58:11 AM This report has been signed electronically. Number of Addenda: 0

## 2019-11-18 NOTE — Anesthesia Preprocedure Evaluation (Addendum)
Anesthesia Evaluation  Patient identified by MRN, date of birth, ID band Patient awake    Reviewed: Allergy & Precautions, NPO status , Patient's Chart, lab work & pertinent test results  Airway Mallampati: II  TM Distance: >3 FB Neck ROM: Full    Dental  (+) Partial Upper, Partial Lower   Pulmonary COPD,    breath sounds clear to auscultation       Cardiovascular hypertension,  Rhythm:Regular Rate:Normal     Neuro/Psych PSYCHIATRIC DISORDERS negative neurological ROS     GI/Hepatic Neg liver ROS, PUD, GERD  ,  Endo/Other  Hypothyroidism   Renal/GU Renal disease     Musculoskeletal  (+) Arthritis ,   Abdominal Normal abdominal exam  (+)   Peds  Hematology   Anesthesia Other Findings   Reproductive/Obstetrics                            Anesthesia Physical Anesthesia Plan  ASA: III  Anesthesia Plan: MAC   Post-op Pain Management:    Induction: Intravenous  PONV Risk Score and Plan: 0 and Propofol infusion  Airway Management Planned: Natural Airway and Simple Face Mask  Additional Equipment: None  Intra-op Plan:   Post-operative Plan:   Informed Consent: I have reviewed the patients History and Physical, chart, labs and discussed the procedure including the risks, benefits and alternatives for the proposed anesthesia with the patient or authorized representative who has indicated his/her understanding and acceptance.       Plan Discussed with: CRNA  Anesthesia Plan Comments:        Anesthesia Quick Evaluation

## 2019-11-18 NOTE — Progress Notes (Signed)
11/18/2019 0020- patient requesting pain medication for acute back pain.  Received Tylenol earlier.  Notified provider on-call , Jeannette Corpus, NP for pain medication.  11/18/2018 0030- received new pain medication order.  0040- in to give patient medication, "I don't want anything but Tylenol." Medication returned, instructed next dose can be given at 0230.

## 2019-11-18 NOTE — Progress Notes (Signed)
PROGRESS NOTE  [name]  DOB: [birth year]  PCP: Caren Macadam, MD RFX:[id number]  DOA: 11/16/2019  LOS: 2 days   Chief Complaint  Patient presents with  . Rectal Bleeding  . Fatigue   Brief narrative: [name] is a 85 y.o. female with PMH of HTN, HLD, CKD, diverticulosis, GERD, chronic GI bleeding, chronic anemia, hypothyroidism,COPD, Purpura from steroids (per patient). Patient presented to the ED on 10/9 with 3-day history of lower abdominal pain and bright red blood per rectum.  She always had some blood per rectum for years abut has gotten worse recently. Reported significant generalied fatigue. Not vaccinated against Covid.  In the ED, patient was hemodynamically stable. Labs with hemoglobin 10.7, FOBT positive. CT abdomen and pelvis with inflammatory changes of the rectosigmoid, likely proctitis without evidence of abscess or perforation.  Patient was admitted to hospitalist service for further evaluation management.  Subjective: Patient was seen and examined this morning. Lying down in bed.  She was waiting for flexible sigmoidoscopy. Procedure terminated now.  Report and recommendation as below.   Patient also mentioned that she might have overstretched her arm yesterday and is having back pain.    Assessment/Plan: Acute on chronic lower GI bleeding Ulcerative proctosigmoiditis -Presented with acute worsening of chronic intermittent BRBPR.  -CT scan finding of proctitis and sigmoid diverticulosis.  -GI consultation obtained. -10/11, patient underwent flexible sigmoidoscopy.  Findings: moderate diverticulosis in the sigmoid colon. No evidence of diverticular bleeding.  Also found to have diffuse moderate inflammation in the rectum and in the sigmoid colon secondary to proctosigmoid colitis concerning for ulcerative proctosigmoiditis.  -Being monitored off antibiotics.  Chronic gastroparesis/chronic abdominal pain/chronic diarrhea -Patient reports remote  history of laparotomy and colonic resection twice in the past.  -Patient reports chronic gastroparesis, chronic abdominal pain, chronic diarrhea because of which her appetite has been only limited to liquid diet and pured.  Chronic iron deficiency anemia -Hemoglobin fluctuating but remains at baseline.  Vitamin B12, folate and ferritin level normal. -Continue to monitor. -Continue chronic iron supplement. Recent Labs    04/30/19 1449 06/03/19 1342 08/09/19 0148 08/09/19 0148 11/16/19 1318 11/16/19 1318 11/16/19 2039 11/16/19 2039 11/17/19 0506 11/18/19 0545  HGB 11.6*   < > 8.9*  --  10.7*  --  11.0*  --  10.1* 10.1*  MCV 95.2   < > 96.1   < > 93.4   < > 91.3   < > 92.7 93.9  VITAMINB12 452  --   --   --   --   --   --   --  431  --   FOLATE  --   --   --   --   --   --   --   --  43.4  --   FERRITIN  --   --   --   --   --   --   --   --  51  --   TIBC  --   --   --   --   --   --   --   --  310  --   IRON  --   --   --   --   --   --   --   --  50  --   RETICCTPCT  --   --   --   --   --   --   --   --  1.3  --    < > =  values in this interval not displayed.   Back pain -mentioned that she might have overstretched her arm yesterday and is having upper back pain.   -She does not want any pain medicine other than Tylenol.  Okay to let her use heat pads  GERD/ Moderate-sized hiatal hernia -Patient denies any reflux symptoms because of the dietary restrictions.  Not on PPI or H2 blocker at home.  Hypothyroidism -Continue Synthroid.   Essential hypertension  -not on BP meds at home -Continue to monitor blood pressure.  COPD -Continue bronchodilators.   Not vaccinated for against COVID infection  -Pt is interested in getting vaccinated while hospitalized.  Mobility: Encourage ambulation Code Status:   Code Status: DNR  Nutritional status: Body mass index is 23.8 kg/m.     Diet Order            Diet clear liquid Room service appropriate? Yes; Fluid consistency:  Thin  Diet effective now                 DVT prophylaxis: SCDs Start: 11/16/19 2303   Antimicrobials:  None Fluid: Off IV fluid Consultants: [facility] Communication:  None at bedside  Status is: Inpatient  Remains inpatient appropriate because of GI bleeding,   Dispo: The patient is from: ALF              Anticipated d/c is to: ALF              Anticipated d/c date is: 3 days              Patient currently is not medically stable to d/c.  Infusions:    Scheduled Meds: . budesonide (PULMICORT) nebulizer solution  0.25 mg Nebulization BID  . levothyroxine  50 mcg Oral Q MTWThF  . levothyroxine  50 mcg Oral Q MTWThF  . [START ON 11/23/2019] levothyroxine  75 mcg Oral Once per day on Sun Sat  . methocarbamol  500 mg Oral Once  . traMADol  50 mg Oral Once    Antimicrobials: Anti-infectives (From admission, onward)   Start     Dose/Rate Route Frequency Ordered Stop   11/17/19 0200  piperacillin-tazobactam (ZOSYN) IVPB 3.375 g  Status:  Discontinued        3.375 g 12.5 mL/hr over 240 Minutes Intravenous Every 8 hours 11/16/19 1837 11/17/19 1758   11/16/19 1815  piperacillin-tazobactam (ZOSYN) IVPB 3.375 g        3.375 g 100 mL/hr over 30 Minutes Intravenous  Once 11/16/19 1814 11/16/19 1856      PRN meds: acetaminophen **OR** acetaminophen, albuterol   Objective: Vitals:   11/18/19 1210 11/18/19 1229  BP: 133/72 (!) 136/114  Pulse: 66 74  Resp: 16 16  Temp:  97.7 F (36.5 C)  SpO2: 99% 98%    Intake/Output Summary (Last 24 hours) at 11/18/2019 1436 Last data filed at 11/18/2019 1148 Gross per 24 hour  Intake 592.51 ml  Output 401 ml  Net 191.51 ml   Filed Weights   11/16/19 1259  Weight: 64.9 kg   Weight change:  Body mass index is 23.8 kg/m.   Physical Exam: General exam: Appears calm and comfortable.  Mild distress because of back pain Skin: No rashes, lesions or ulcers. HEENT: Atraumatic, normocephalic, supple neck, no obvious  bleeding Lungs: Clear to auscultation bilaterally CVS: Regular rate and rhythm, no murmur GI/Abd soft, nondistended, mild chronic diffuse tenderness, bowel sound present CNS: Alert, awake, oriented x3 Psychiatry: Mood appropriate Extremities: No pedal  edema, no calf tenderness  Data Review: I have personally reviewed the laboratory data and studies available.  Recent Labs  Lab 11/16/19 1318 11/16/19 2039 11/17/19 0506 11/18/19 0545  WBC 7.9 7.5 7.7 6.6  NEUTROABS  --   --   --  2.6  HGB 10.7* 11.0* 10.1* 10.1*  HCT 32.8* 33.4* 30.5* 30.9*  MCV 93.4 91.3 92.7 93.9  PLT 387 270 323 330   Recent Labs  Lab 11/16/19 1318 11/17/19 0506 11/18/19 0545  NA 132* 133* 134*  K 4.5 3.8 4.0  CL 97* 100 103  CO2 23 23 23   GLUCOSE 98 84 87  BUN 15 12 9   CREATININE 0.69 0.63 0.54  CALCIUM 9.8 8.9 9.1  MG  --   --  2.0  PHOS  --   --  2.9    F/u labs ordered  Signed, Terrilee Croak, MD [facility] 11/18/2019

## 2019-11-19 DIAGNOSIS — K51311 Ulcerative (chronic) rectosigmoiditis with rectal bleeding: Secondary | ICD-10-CM

## 2019-11-19 LAB — CBC WITH DIFFERENTIAL/PLATELET
Abs Immature Granulocytes: 0.01 10*3/uL (ref 0.00–0.07)
Basophils Absolute: 0.1 10*3/uL (ref 0.0–0.1)
Basophils Relative: 1 %
Eosinophils Absolute: 1.3 10*3/uL — ABNORMAL HIGH (ref 0.0–0.5)
Eosinophils Relative: 18 %
HCT: 31 % — ABNORMAL LOW (ref 36.0–46.0)
Hemoglobin: 10.4 g/dL — ABNORMAL LOW (ref 12.0–15.0)
Immature Granulocytes: 0 %
Lymphocytes Relative: 21 %
Lymphs Abs: 1.5 10*3/uL (ref 0.7–4.0)
MCH: 30.7 pg (ref 26.0–34.0)
MCHC: 33.5 g/dL (ref 30.0–36.0)
MCV: 91.4 fL (ref 80.0–100.0)
Monocytes Absolute: 1.2 10*3/uL — ABNORMAL HIGH (ref 0.1–1.0)
Monocytes Relative: 18 %
Neutro Abs: 2.9 10*3/uL (ref 1.7–7.7)
Neutrophils Relative %: 42 %
Platelets: 340 10*3/uL (ref 150–400)
RBC: 3.39 MIL/uL — ABNORMAL LOW (ref 3.87–5.11)
RDW: 14.7 % (ref 11.5–15.5)
WBC: 7 10*3/uL (ref 4.0–10.5)
nRBC: 0 % (ref 0.0–0.2)

## 2019-11-19 LAB — SURGICAL PATHOLOGY

## 2019-11-19 MED ORDER — MESALAMINE 1.2 G PO TBEC
2.4000 g | DELAYED_RELEASE_TABLET | Freq: Every day | ORAL | Status: DC
  Administered 2019-11-19: 2.4 g via ORAL
  Filled 2019-11-19 (×2): qty 2

## 2019-11-19 MED ORDER — MESALAMINE 1000 MG RE SUPP
1000.0000 mg | Freq: Every day | RECTAL | Status: DC
  Filled 2019-11-19: qty 1

## 2019-11-19 NOTE — Care Management Important Message (Signed)
Important Message  Patient Details IM Letter given to the Patient Name: [name] MRN: [id number] Date of Birth: [birth year]   Medicare Important Message Given:  Yes     [name] 11/19/2019, 11:07 AM

## 2019-11-19 NOTE — Progress Notes (Signed)
Physical Therapy Treatment Patient Details Name: [name] MRN: [id number] DOB: [birth year] Today's Date: 11/19/2019    History of Present Illness [name] is a 85 y.o. female with a past medical history significant for diverticulitis, anemia, GERD, hyperlipidemia, hypertension, SBO, CKD and COPD, who [facility] the ER from her [facility] on [facility]/16/2019 with a complaint of rectal bleeding for 3 days and some lower abdominal pain    PT Comments    Pt tolerated increased activity level, she ambulated 12' with RW in her room, distance limited by generalized fatigue. Instructed pt in seated BUE/LE exercises. Pt reported mild dizziness in sitting.   Follow Up Recommendations  Home health PT     Equipment Recommendations  None recommended by PT    Recommendations for Other Services       Precautions / Restrictions Precautions Precautions: Fall Precaution Comments: pt reports she fell in bathroom ~5 weeks ago Restrictions Weight Bearing Restrictions: No    Mobility  Bed Mobility Overal bed mobility: Modified Independent Bed Mobility: Supine to Sit       Sit to supine: Modified independent (Device/Increase time)   General bed mobility comments: used rail  Transfers Overall transfer level: Needs assistance Equipment used: Rolling walker (2 wheeled) Transfers: Sit to/from Stand Sit to Stand: Min guard         General transfer comment: min/guard safety, pt reports mild dizziness  Ambulation/Gait Ambulation/Gait assistance: Min guard Gait Distance (Feet): 12 Feet Assistive device: Rolling walker (2 wheeled) Gait Pattern/deviations: Step-through pattern;Decreased stride length Gait velocity: decr   General Gait Details: ambulated with RW in room, distance limited by fatigue, no loss of balance   Stairs             Wheelchair Mobility    Modified Rankin (Stroke Patients Only)       Balance Overall balance assessment: Needs  assistance;History of Falls   Sitting balance-Leahy Scale: Good       Standing balance-Leahy Scale: Fair                              Cognition Arousal/Alertness: Awake/alert Behavior During Therapy: WFL for tasks assessed/performed Overall Cognitive Status: Within Functional Limits for tasks assessed                                        Exercises General Exercises - Upper Extremity Shoulder Flexion: AROM;Both;10 reps;Seated General Exercises - Lower Extremity Ankle Circles/Pumps: AROM;Both;10 reps;Seated Long Arc Quad: AROM;Both;20 reps;Seated Hip Flexion/Marching: AROM;Both;10 reps;Seated;Standing (10 seated and 10 standing with RW)    General Comments        Pertinent Vitals/Pain Pain Score: 8  Pain Location: back Pain Descriptors / Indicators: Aching Pain Intervention(s): Limited activity within patient's tolerance;Monitored during session;Premedicated before session    Home Living                      Prior Function            PT Goals (current goals can now be found in the care plan section) Acute Rehab PT Goals Patient Stated Goal: Regain IND PT Goal Formulation: With patient Time For Goal Achievement: 12/01/19 Potential to Achieve Goals: Good Progress towards PT goals: Progressing toward goals    Frequency    Min 3X/week      PT  Plan Current plan remains appropriate    Co-evaluation PT/OT/SLP Co-Evaluation/Treatment: Yes            AM-PAC PT "6 Clicks" Mobility   Outcome Measure  Help needed turning from your back to your side while in a flat bed without using bedrails?: None Help needed moving from lying on your back to sitting on the side of a flat bed without using bedrails?: None Help needed moving to and from a bed to a chair (including a wheelchair)?: A Little Help needed standing up from a chair using your arms (e.g., wheelchair or bedside chair)?: A Little Help needed to walk in hospital  room?: A Little Help needed climbing 3-5 steps with a railing? : A Little 6 Click Score: 20    End of Session Equipment Utilized During Treatment: Gait belt Activity Tolerance: Patient limited by fatigue;Other (comment) Patient left: with call bell/phone within reach;in chair;with chair alarm set Nurse Communication: Mobility status PT Visit Diagnosis: Difficulty in walking, not elsewhere classified (R26.2);Muscle weakness (generalized) (M62.81);Pain     Time: 1840-1900 PT Time Calculation (min) (ACUTE ONLY): 20 min  Charges:  $Gait Training: 8-22 mins                     Blondell Reveal Kistler PT 11/19/2019  Acute Rehabilitation Services Pager [phone] Office [phone]

## 2019-11-19 NOTE — Progress Notes (Signed)
     Progress Note  Chief Complaint:    Rectal bleeding     ASSESSMENT / PLAN:    # Chronic rectal bleeding, probably IBD ---proctosigmoid colitis on flexible sigmoidoscopy yesterday. Path shows chronic active colitis, no granulomas.  --Starting Canasa suppositories QHS. Also will start oral Mesalamine 2.4 grams daily ( Lialda).  --Advanced to soft diet.  --Follow up with me in office on 11/9 at 930am --Stable for discharge from GI standpoint.     SUBJECTIVE:   Feels a little weak, mainly from not eating.     OBJECTIVE:    Scheduled inpatient medications:  . budesonide (PULMICORT) nebulizer solution  0.25 mg Nebulization BID  . levothyroxine  50 mcg Oral Q MTWThF  . levothyroxine  50 mcg Oral Q MTWThF  . [START ON 11/23/2019] levothyroxine  75 mcg Oral Once per day on Sun Sat  . mesalamine  1,000 mg Rectal QHS  . mesalamine  2.4 g Oral Q breakfast  . methocarbamol  500 mg Oral Once  . traMADol  50 mg Oral Once   Continuous inpatient infusions:  PRN inpatient medications: acetaminophen **OR** acetaminophen, albuterol  Vital signs in last 24 hours: Temp:  [97.9 F (36.6 C)-98.8 F (37.1 C)] 98.2 F (36.8 C) (10/12 1338) Pulse Rate:  [67-79] 77 (10/12 1338) Resp:  [15-20] 18 (10/12 1338) BP: (122-159)/(38-75) 128/38 (10/12 1338) SpO2:  [96 %-98 %] 96 % (10/12 1338) Last BM Date: 11/18/19  Intake/Output Summary (Last 24 hours) at 11/19/2019 1415 Last data filed at 11/19/2019 1022 Gross per 24 hour  Intake --  Output 2300 ml  Net -2300 ml     Physical Exam:  . General: Alert, female in NAD . Heart:  Regular rate and rhythm. No murmur. No lower extremity edema . Pulmonary: Normal respiratory effort . Abdomen: Soft, nondistended, nontender. Normal bowel sounds.  . Neurologic: Alert and oriented . Psych: Pleasant. Cooperative.   Filed Weights   11/16/19 1259  Weight: 64.9 kg    Intake/Output from previous day: 10/11 0701 - 10/12 0700 In: 400  [I.V.:400] Out: 2000 [Urine:2000] Intake/Output this shift: Total I/O In: -  Out: 300 [Urine:300]    Lab Results: Recent Labs    11/17/19 0506 11/18/19 0545 11/19/19 0550  WBC 7.7 6.6 7.0  HGB 10.1* 10.1* 10.4*  HCT 30.5* 30.9* 31.0*  PLT 323 330 340   BMET Recent Labs    11/17/19 0506 11/18/19 0545  NA 133* 134*  K 3.8 4.0  CL 100 103  CO2 23 23  GLUCOSE 84 87  BUN 12 9  CREATININE 0.63 0.54  CALCIUM 8.9 9.1   LFT Recent Labs    11/17/19 0506  PROT 6.2*  ALBUMIN 3.5  AST 22  ALT 14  ALKPHOS 42  BILITOT 0.9     Active Problems:   Hypothyroidism   HYPERCHOLESTEROLEMIA   Essential hypertension   GERD   Iron deficiency anemia   COPD (chronic obstructive pulmonary disease) (HCC)   Lower GI bleed   Rectal bleeding   Abnormal CT scan, gastrointestinal tract     LOS: 3 days   [name] ,NP 11/19/2019, 2:15 PM

## 2019-11-19 NOTE — Progress Notes (Addendum)
PROGRESS NOTE  [name]  DOB: [birth year]  PCP: Caren Macadam, MD MPN:[id number]  DOA: 11/16/2019  LOS: 3 days   Chief Complaint  Patient presents with  . Rectal Bleeding  . Fatigue   Brief narrative: [name] is a 85 y.o. female with PMH of HTN, HLD, CKD, diverticulosis, GERD, chronic GI bleeding, chronic anemia, hypothyroidism,COPD, Purpura from steroids (per patient). Patient presented to the ED on 10/9 with 3-day history of lower abdominal pain and bright red blood per rectum.  She always had some blood per rectum for years abut has gotten worse recently. Reported significant generalied fatigue. Not vaccinated against Covid.  In the ED, patient was hemodynamically stable. Labs with hemoglobin 10.7, FOBT positive. CT abdomen and pelvis with inflammatory changes of the rectosigmoid, likely proctitis without evidence of abscess or perforation.  Patient was admitted to hospitalist service for further evaluation management.  Subjective: Patient was seen and examined this morning. Lying down in bed.   She says he is not feeling well. 'Liquid diet just gas out of me right away.' Feeling weak. Not been out of bed.  Hemoglobin stable.   Assessment/Plan: Acute on chronic lower GI bleeding Ulcerative proctosigmoiditis -Presented with acute worsening of chronic intermittent BRBPR.  -She also complains of chronic gastroparesis, chronic abdominal pain and chronic diarrhea -CT scan on admission showed proctitis and sigmoid diverticulosis.  -GI consultation was obtained. -10/11, patient underwent flexible sigmoidoscopy. She was found to have moderate diverticulosis in the sigmoid colon. No evidence of diverticular bleeding.  Also found to have diffuse moderate inflammation in the rectum and in the sigmoid colon secondary to proctosigmoid colitis concerning for ulcerative proctosigmoiditis.  -Per GI note, biopsy report showed chronic active colitis, no granuloma.  -Patient has  been started on Canasa suppositories QHS and oral Mesalamine 2.4 grams daily ( Lialda).  -Not on antibiotics. -Patient will follow up with GI as an outpatient. -Patient's appetite is chronically been limited to liquid diet and pured.  Chronic iron deficiency anemia -Hemoglobin fluctuating but remains at baseline.  Vitamin B12, folate and ferritin level normal. -Continue to monitor. -Continue chronic iron supplement. Recent Labs    04/30/19 1449 06/03/19 1342 11/16/19 1318 11/16/19 1318 11/16/19 2039 11/16/19 2039 11/17/19 0506 11/17/19 0506 11/18/19 0545 11/19/19 0550  HGB 11.6*   < > 10.7*  --  11.0*  --  10.1*  --  10.1* 10.4*  MCV 95.2   < > 93.4   < > 91.3   < > 92.7   < > 93.9 91.4  VITAMINB12 452  --   --   --   --   --  431  --   --   --   FOLATE  --   --   --   --   --   --  43.4  --   --   --   FERRITIN  --   --   --   --   --   --  51  --   --   --   TIBC  --   --   --   --   --   --  310  --   --   --   IRON  --   --   --   --   --   --  50  --   --   --   RETICCTPCT  --   --   --   --   --   --  1.3  --   --   --    < > = values in this interval not displayed.    GERD/ Moderate-sized hiatal hernia -Patient denies any reflux symptoms because of the dietary restrictions.  Not on PPI or H2 blocker at home.  Hypothyroidism -Continue Synthroid.   Essential hypertension  -not on BP meds at home -Continue to monitor blood pressure.  COPD -Continue bronchodilators.   Generalized weakness -PT eval ordered.  Mobility: Encourage ambulation Code Status:   Code Status: DNR  Nutritional status: Body mass index is 23.8 kg/m.     Diet Order            DIET SOFT Room service appropriate? Yes; Fluid consistency: Thin  Diet effective now                 DVT prophylaxis: SCDs Start: 11/16/19 2303   Antimicrobials:  None Fluid: Off IV fluid Consultants: [facility] Communication:  None at bedside  Status is: Inpatient  Remains inpatient  appropriate because of generalized weakness.  GI signed off today.  Patient feels weak.  Pending PT eval.  Dispo: The patient is from: ALF              Anticipated d/c is to: ALF              Anticipated d/c date is: Likely tomorrow.              Patient currently is not medically stable to d/c.  Infusions:    Scheduled Meds: . budesonide (PULMICORT) nebulizer solution  0.25 mg Nebulization BID  . levothyroxine  50 mcg Oral Q MTWThF  . levothyroxine  50 mcg Oral Q MTWThF  . [START ON 11/23/2019] levothyroxine  75 mcg Oral Once per day on Sun Sat  . mesalamine  1,000 mg Rectal QHS  . mesalamine  2.4 g Oral Q breakfast  . methocarbamol  500 mg Oral Once  . traMADol  50 mg Oral Once    Antimicrobials: Anti-infectives (From admission, onward)   Start     Dose/Rate Route Frequency Ordered Stop   11/17/19 0200  piperacillin-tazobactam (ZOSYN) IVPB 3.375 g  Status:  Discontinued        3.375 g 12.5 mL/hr over 240 Minutes Intravenous Every 8 hours 11/16/19 1837 11/17/19 1758   11/16/19 1815  piperacillin-tazobactam (ZOSYN) IVPB 3.375 g        3.375 g 100 mL/hr over 30 Minutes Intravenous  Once 11/16/19 1814 11/16/19 1856      PRN meds: acetaminophen **OR** acetaminophen, albuterol   Objective: Vitals:   11/19/19 1303 11/19/19 1338  BP: (!) 159/75 (!) 128/38  Pulse: 79 77  Resp: 18 18  Temp: 97.9 F (36.6 C) 98.2 F (36.8 C)  SpO2: 98% 96%    Intake/Output Summary (Last 24 hours) at 11/19/2019 1524 Last data filed at 11/19/2019 1022 Gross per 24 hour  Intake --  Output 2300 ml  Net -2300 ml   Filed Weights   11/16/19 1259  Weight: 64.9 kg   Weight change:  Body mass index is 23.8 kg/m.   Physical Exam: General exam: Appears calm and comfortable.  Not in physical distress Skin: No rashes, lesions or ulcers. HEENT: Atraumatic, normocephalic, supple neck, no obvious bleeding Lungs: Clear to auscultation bilaterally CVS: Regular rate and rhythm, no  murmur GI/Abd soft, nondistended, continues to have mild chronic diffuse tenderness, bowel sound present CNS: Alert, awake, oriented x3 Psychiatry: Seems depressed today. Extremities: No  pedal edema, no calf tenderness  Data Review: I have personally reviewed the laboratory data and studies available.  Recent Labs  Lab 11/16/19 1318 11/16/19 2039 11/17/19 0506 11/18/19 0545 11/19/19 0550  WBC 7.9 7.5 7.7 6.6 7.0  NEUTROABS  --   --   --  2.6 2.9  HGB 10.7* 11.0* 10.1* 10.1* 10.4*  HCT 32.8* 33.4* 30.5* 30.9* 31.0*  MCV 93.4 91.3 92.7 93.9 91.4  PLT 387 270 323 330 340   Recent Labs  Lab 11/16/19 1318 11/17/19 0506 11/18/19 0545  NA 132* 133* 134*  K 4.5 3.8 4.0  CL 97* 100 103  CO2 23 23 23   GLUCOSE 98 84 87  BUN 15 12 9   CREATININE 0.69 0.63 0.54  CALCIUM 9.8 8.9 9.1  MG  --   --  2.0  PHOS  --   --  2.9    F/u labs ordered  Signed, Terrilee Croak, MD [facility] 11/19/2019

## 2019-11-20 ENCOUNTER — Encounter (HOSPITAL_COMMUNITY): Payer: Self-pay | Admitting: Gastroenterology

## 2019-11-20 DIAGNOSIS — D509 Iron deficiency anemia, unspecified: Secondary | ICD-10-CM | POA: Diagnosis not present

## 2019-11-20 DIAGNOSIS — K51311 Ulcerative (chronic) rectosigmoiditis with rectal bleeding: Secondary | ICD-10-CM | POA: Diagnosis not present

## 2019-11-20 MED ORDER — MESALAMINE 1000 MG RE SUPP: 1000.0000 mg | Freq: Every day | RECTAL | 0 refills | Status: DC

## 2019-11-20 MED ORDER — LEVOTHYROXINE SODIUM 50 MCG PO TABS: 50.0000 ug | ORAL_TABLET | ORAL | Status: DC

## 2019-11-20 MED ORDER — LEVOTHYROXINE SODIUM 75 MCG PO TABS: 75.0000 ug | ORAL_TABLET | ORAL | Status: DC

## 2019-11-20 MED ORDER — MESALAMINE 1.2 G PO TBEC: 2.4000 g | DELAYED_RELEASE_TABLET | Freq: Every day | ORAL | 0 refills | Status: DC

## 2019-11-20 NOTE — Progress Notes (Signed)
Pt discharged home with all belongings. Discharge education reviewed with pt, no questions or concerns at this time. Advised to follow up with PCP for any other concerns.

## 2019-11-20 NOTE — TOC Transition Note (Signed)
Transition of Care [facility]) - CM/SW Discharge Note   Patient Details  Name: [name] MRN: [id number] Date of Birth: 11-13-1934  [facility] CM/SW Contact:  Lynnell Catalan, RN Phone Number: 11/20/2019, 12:05 PM   Clinical Narrative:    This CM just happened to notice HHPT order. Went to pt room and pt had already been discharged home. Attempted phone call without success.  Readmission Risk Interventions No flowsheet data found.

## 2019-11-20 NOTE — Discharge Instructions (Signed)

## 2019-11-20 NOTE — Discharge Summary (Signed)
Physician Discharge Summary  [name] CWU:[id number] DOB: 08-10-1934  PCP: Caren Macadam, MD  Admitted from: Home/prior independent living facility Discharged to: Home/prior independent living facility  Admit date: 11/16/2019 Discharge date: 11/20/2019  Recommendations for Outpatient Follow-up:    Follow-up Information    Willia Craze, NP Follow up on 12/17/2019.   Specialty: Gastroenterology Why: at 9:30 am Contact information: [place] [address] [phone]        Caren Macadam, MD. Schedule an appointment as soon as possible for a visit in 1 week(s).   Specialty: Family Medicine Why: To be seen with repeat labs (CBC & BMP). Contact information: [place] [address] [phone]                Home Health: PT Equipment/Devices: None  Discharge Condition: Improved and stable CODE STATUS: DNR Diet recommendation: Heart healthy soft/low residue gastroparesis diet.  Discharge Diagnoses:  Active Problems:   Hypothyroidism   HYPERCHOLESTEROLEMIA   Essential hypertension   GERD   Iron deficiency anemia   COPD (chronic obstructive pulmonary disease) (HCC)   Lower GI bleed   Rectal bleeding   Abnormal CT scan, gastrointestinal tract   Chronic ulcerative rectosigmoiditis with rectal bleeding [facility])   Brief Summary: 85 year old female with PMH of HTN, HLD, CKD, diverticulosis, GERD, chronic GI bleeding, chronic anemia, hypothyroidism, COPD, purpura from steroids as per patient report, presented to the ED on 11/16/2019 with complaints of 3-day history of lower abdominal pain and bright red bleeding per rectum.  She reported that she always had some blood per rectum for years but this had gotten worse recently.  She reported significant generalized fatigue.  In the ED, patient was hemodynamically stable.  Lab work showed hemoglobin of 10.7.  FOBT positive.  CT abdomen and pelvis showed inflammatory changes  of the rectosigmoid likely proctitis without evidence of abscess or perforation.  She was admitted to hospitalist service for further evaluation and management.  Assessment and plan:  Acute on chronic lower GI bleeding/ulcerative proctosigmoiditis: -Presented with acute worsening of chronic intermittent BRBPR.  -She also complains of chronic gastroparesis, chronic abdominal pain and chronic diarrhea -CT scan on admission showed proctitis and sigmoid diverticulosis.  -GI consultation was obtained. -10/11, patient underwent flexible sigmoidoscopy. She was found to have moderate diverticulosis in the sigmoid colon. No evidence of diverticular bleeding.  Also found to have diffuse moderate inflammation in the rectum and in the sigmoid colon secondary to proctosigmoid colitis concerning for ulcerative proctosigmoiditis.  -Per GI note, biopsy report showed chronic active colitis, no granuloma.  -Patient has been started on Canasa suppositories QHS and oral Mesalamine 2.4 grams daily ( Lialda).  -Not on antibiotics. -Patient will follow up with GI as an outpatient. -Patient's appetite is chronically been limited to liquid diet and pured. -As per GI signed off note 10/12: Pathology results consistent with IBD, specifically proctosigmoiditis.  This very likely explains patient's rectal bleeding.  They recommended mesalamine/Lialda 2.4 g daily but also Canasa 1 g nightly.  They explained that given the distal nature of her inflammation, oral mesalamine may not be sufficient to adequately treat.  She will continue the soft/gastroparesis diet which she has been on for years.  They have arranged follow-up in the office. -She is tolerating soft diet without abdominal pain, nausea or vomiting.  No BM for the last 3 days.  Passing flatus.  No rectal bleeding.   Chronic iron deficiency anemia -Hemoglobin fluctuating but remains  at baseline.  Vitamin B12, folate and ferritin level normal. -Continue to  monitor. -Continue chronic iron supplement.  GERD/ Moderate-sized hiatal hernia -Patient denies any reflux symptoms because of the dietary restrictions.  Not on PPI or H2 blocker at home.  Hypothyroidism -Continue Synthroid as per prior home dose.   Essential hypertension  -not on BP meds at home -Stable  COPD -Continue bronchodilators.  Stable without clinical bronchospasm.  Generalized weakness -PT evaluated and recommend home health PT.  Stronger today.  Consultations:  [facility]  Procedures:  Flexible sigmoidoscopy 11/18/2019:  Impression: - Preparation of the colon was unsatisfactory above 35 cm and fair below 35 cm. - Moderate diverticulosis in the sigmoid colon. There was narrowing of the colon in association with the diverticular opening. There was evidence of diverticular spasm. There was no evidence of diverticular bleeding. - Diffuse moderate inflammation was found in the rectum and in the sigmoid colon to 20 cm secondary to proctosigmoid colitis concerning for ulcerative proctosigmoiditis. Biopsied. - The examination was otherwise normal.  Surgical pathology results:  FINAL MICROSCOPIC DIAGNOSIS:   A. COLON, SIGMOID AND RECTUM, BIOPSY:  - Chronic active colitis  - No granulomata, dysplasia or malignancy identified  - See comment   COMMENT:   The findings are consistent with inflammatory bowel disease in the  appropriate clinical setting; clinical correlation recommended.        Discharge Instructions  Discharge Instructions    Call MD for:   Complete by: As directed    Recurrent/worsening rectal bleeding.   Call MD for:  difficulty breathing, headache or visual disturbances   Complete by: As directed    Call MD for:  extreme fatigue   Complete by: As directed    Call MD for:  persistant dizziness or light-headedness   Complete by: As directed    Call MD for:  persistant nausea and vomiting   Complete by: As directed    Call MD  for:  severe uncontrolled pain   Complete by: As directed    Call MD for:  temperature >100.4   Complete by: As directed    Diet - low sodium heart healthy   Complete by: As directed    Soft/gastroparesis diet.  Avoid high-fiber/high residue diet.   Increase activity slowly   Complete by: As directed        Medication List    TAKE these medications   acetaminophen 325 MG tablet Commonly known as: TYLENOL Take 975 mg by mouth daily. Take 3 tablets (975 mg) daily   E-Z Spacer inhaler Use as instructed   Flovent HFA 110 MCG/ACT inhaler Generic drug: fluticasone TAKE 1 PUFF BY MOUTH TWICE A DAY   Iron 325 (65 Fe) MG Tabs Take 1 tablet by mouth daily.   levothyroxine 50 MCG tablet Commonly known as: SYNTHROID Take 1 tablet (50 mcg total) by mouth every Monday, Tuesday, Wednesday, Thursday, and Friday. What changed: when to take this   levothyroxine 75 MCG tablet Commonly known as: SYNTHROID Take 1 tablet (75 mcg total) by mouth as directed. Take by mouth 2 days per week (Sat & Sun) What changed: See the new instructions.   mesalamine 1000 MG suppository Commonly known as: CANASA Place 1 suppository (1,000 mg total) rectally at bedtime.   mesalamine 1.2 g EC tablet Commonly known as: LIALDA Take 2 tablets (2.4 g total) by mouth daily with breakfast. Start taking on: November 21, 2019   senna-docusate 8.6-50 MG tablet Commonly known as: Senokot-S Take 1  tablet by mouth at bedtime as needed for mild constipation.   VITAMIN D3 PO Take 1 tablet by mouth 2 (two) times a week. Saturday and Sunday      Allergies  Allergen Reactions  . [place] Nut (Berthollefia [place]) Skin Test Anaphylaxis  . Fluconazole Shortness Of Breath  . Metronidazole Shortness Of Breath and Nausea And Vomiting  . Pepcid [Famotidine] Shortness Of Breath and Other (See Comments)    Dizziness  . Shellfish Allergy Anaphylaxis  . Macrobid [Nitrofurantoin]     REACTION: Syncope  . Pantoprazole  Other (See Comments)    CHEST PAIN      Procedures/Studies: CT ABDOMEN PELVIS W CONTRAST  Result Date: 11/16/2019 CLINICAL DATA:  85 year old female with lower abdominal pain. History of diverticulitis. EXAM: CT ABDOMEN AND PELVIS WITH CONTRAST TECHNIQUE: Multidetector CT imaging of the abdomen and pelvis was performed using the standard protocol following bolus administration of intravenous contrast. CONTRAST:  151m OMNIPAQUE IOHEXOL 300 MG/ML  SOLN COMPARISON:  CT abdomen pelvis dated 08/06/2019. FINDINGS: Lower chest: The visualized lung bases are clear. There is coronary vascular calcification. No intra-abdominal free air or free fluid. Hepatobiliary: The liver is unremarkable. No intrahepatic biliary ductal dilatation. Cholecystectomy. There is mild dilatation of the common bile duct, likely post cholecystectomy. No retained calcified stone noted in the central CBD. Pancreas: Unremarkable. No pancreatic ductal dilatation or surrounding inflammatory changes. Spleen: Normal in size without focal abnormality. Adrenals/Urinary Tract: The adrenal glands unremarkable. There is no hydronephrosis on either side. There is symmetric enhancement and excretion of contrast by both kidneys. The visualized ureters and urinary bladder appear unremarkable. Stomach/Bowel: There is a moderate size hiatal hernia containing a portion of the stomach. There is postsurgical changes of bowel with anastomotic suture in the lower abdomen. There is sigmoid diverticulosis. There is diffuse inflammatory changes of the rectosigmoid, likely proctitis. Clinical correlation is recommended. No abscess or perforation. There is moderate stool throughout the colon. No bowel obstruction. Appendectomy. Vascular/Lymphatic: The abdominal aorta and IVC are unremarkable. No portal venous gas. There is no adenopathy. Reproductive: Hysterectomy. Other: Midline vertical anterior pelvic wall incisional scar. Musculoskeletal: Degenerative changes of  the spine. No acute osseous pathology. IMPRESSION: 1. Inflammatory changes of the rectosigmoid, likely proctitis. Clinical correlation is recommended. No abscess or perforation. 2. Sigmoid diverticulosis. 3. Moderate size hiatal hernia containing a portion of the stomach. 4. Aortic Atherosclerosis (ICD10-I70.0). Electronically Signed   By: AAnner CreteM.D.   On: 11/16/2019 16:36      Subjective: Patient anxious to leave by 9 AM today.  I was sent a secure chat by charge RN stating same.  Patient reports no abdominal pain, nausea or vomiting and is tolerating soft diet.  Currently having breakfast.  No further rectal bleeding.  Has not had a BM since day prior to admission.  Passing flatus.  Reports chronic neck/low back and left shoulder pain which was a little aggravated after imaging studies in the hospital.  Reports that she lives alone in a senior/independent living facility, her son lives nearby and assist with care.  Discharge Exam:  Vitals:   11/19/19 1338 11/19/19 1829 11/19/19 2221 11/20/19 0609  BP: (!) 128/38  138/69 118/70  Pulse: 77  69 73  Resp: 18  18 18   Temp: 98.2 F (36.8 C)  98.1 F (36.7 C) 98.6 F (37 C)  TempSrc: Oral  Oral Oral  SpO2: 96% 97% 97% 98%  Weight:      Height:  General: Pleasant elderly female, moderately built and nourished sitting up comfortably in bed eating breakfast. Cardiovascular: S1 & S2 heard, RRR, S1/S2 +. No murmurs, rubs, gallops or clicks. No JVD or pedal edema. Respiratory: Clear to auscultation without wheezing, rhonchi or crackles. No increased work of breathing. Abdominal:  Non distended, non tender & soft. No organomegaly or masses appreciated. Normal bowel sounds heard. CNS: Alert and oriented. No focal deficits. Extremities: no edema, no cyanosis    The results of significant diagnostics from this hospitalization (including imaging, microbiology, ancillary and laboratory) are listed below for reference.      Microbiology: Recent Results (from the past 240 hour(s))  Respiratory Panel by RT PCR (Flu A&B, Covid) -     Status: None   Collection Time: 11/16/19  8:16 PM   Specimen: Nasopharyngeal  Result Value Ref Range Status   SARS Coronavirus 2 by RT PCR NEGATIVE NEGATIVE Final    Comment: (NOTE) SARS-CoV-2 target nucleic acids are NOT DETECTED.  The SARS-CoV-2 RNA is generally detectable in upper respiratoy specimens during the acute phase of infection. The lowest concentration of SARS-CoV-2 viral copies this assay can detect is 131 copies/mL. A negative result does not preclude SARS-Cov-2 infection and should not be used as the sole basis for treatment or other patient management decisions. A negative result may occur with  improper specimen collection/handling, submission of specimen other than nasopharyngeal swab, presence of viral mutation(s) within the areas targeted by this assay, and inadequate number of viral copies (<131 copies/mL). A negative result must be combined with clinical observations, patient history, and epidemiological information. The expected result is Negative.  Fact Sheet for Patients:  PinkCheek.be  Fact Sheet for Healthcare Providers:  GravelBags.it  This test is no t yet approved or cleared by the [place] FDA and  has been authorized for detection and/or diagnosis of SARS-CoV-2 by FDA under an Emergency Use Authorization (EUA). This EUA will remain  in effect (meaning this test can be used) for the duration of the COVID-19 declaration under Section 564(b)(1) of the Act, 21 U.S.C. section 360bbb-3(b)(1), unless the authorization is terminated or revoked sooner.     Influenza A by PCR NEGATIVE NEGATIVE Final   Influenza B by PCR NEGATIVE NEGATIVE Final    Comment: (NOTE) The Xpert Xpress SARS-CoV-2/FLU/RSV assay is intended as an aid in  the diagnosis of influenza from Nasopharyngeal  swab specimens and  should not be used as a sole basis for treatment. Nasal washings and  aspirates are unacceptable for Xpert Xpress SARS-CoV-2/FLU/RSV  testing.  Fact Sheet for Patients: PinkCheek.be  Fact Sheet for Healthcare Providers: GravelBags.it  This test is not yet approved or cleared by the [place] FDA and  has been authorized for detection and/or diagnosis of SARS-CoV-2 by  FDA under an Emergency Use Authorization (EUA). This EUA will remain  in effect (meaning this test can be used) for the duration of the  Covid-19 declaration under Section 564(b)(1) of the Act, 21  U.S.C. section 360bbb-3(b)(1), unless the authorization is  terminated or revoked. Performed at [facility], [place] [address], [place], [place] [address]      Labs: CBC: Recent Labs  Lab 11/16/19 1318 11/16/19 2039 11/17/19 0506 11/18/19 0545 11/19/19 0550  WBC 7.9 7.5 7.7 6.6 7.0  NEUTROABS  --   --   --  2.6 2.9  HGB 10.7* 11.0* 10.1* 10.1* 10.4*  HCT 32.8* 33.4* 30.5* 30.9* 31.0*  MCV 93.4 91.3 92.7 93.9 91.4  PLT 387 270 323 330 654    Basic Metabolic Panel: Recent Labs  Lab 11/16/19 1318 11/17/19 0506 11/18/19 0545  NA 132* 133* 134*  K 4.5 3.8 4.0  CL 97* 100 103  CO2 23 23 23   GLUCOSE 98 84 87  BUN 15 12 9   CREATININE 0.69 0.63 0.54  CALCIUM 9.8 8.9 9.1  MG  --   --  2.0  PHOS  --   --  2.9    Liver Function Tests: Recent Labs  Lab 11/16/19 1318 11/17/19 0506  AST 24 22  ALT 16 14  ALKPHOS 47 42  BILITOT 0.6 0.9  PROT 7.3 6.2*  ALBUMIN 3.9 3.5     Urinalysis    Component Value Date/Time   COLORURINE STRAW (A) 11/16/2019 2016   APPEARANCEUR CLEAR 11/16/2019 2016   LABSPEC 1.018 11/16/2019 2016   PHURINE 8.0 11/16/2019 2016   GLUCOSEU NEGATIVE 11/16/2019 2016   GLUCOSEU NEGATIVE 11/21/2016 1510   HGBUR NEGATIVE 11/16/2019 2016   BILIRUBINUR NEGATIVE 11/16/2019 2016    BILIRUBINUR negative 09/12/2019 1030   [place] 11/16/2019 2016   PROTEINUR NEGATIVE 11/16/2019 2016   UROBILINOGEN 0.2 09/12/2019 1030   UROBILINOGEN 0.2 06/26/2017 1546   NITRITE NEGATIVE 11/16/2019 2016   LEUKOCYTESUR NEGATIVE 11/16/2019 2016      Time coordinating discharge: 25 minutes  SIGNED:  Vernell Leep, MD, FACP, [facility]. [facility]  To contact the attending provider between 7A-7P or the covering provider during after hours 7P-7A, please log into the web site [email or web address] and access using universal [place] password for that web site. If you do not have the password, please call the hospital operator.

## 2019-11-21 ENCOUNTER — Telehealth: Payer: Self-pay | Admitting: Family Medicine

## 2019-11-21 NOTE — Telephone Encounter (Signed)
Transition Care Management Follow-up Telephone Call  Date of discharge and from where: 11/20/2019 from [place]   How have you been since you were released from the hospital? Patient states that she has had weakness, and headache Any questions or concerns? Yes , would like to know if her headaches are coming from medications and has been being forgetful lately. Items Reviewed:  Did the pt receive and understand the discharge instructions provided? Yes   Medications obtained and verified? Yes   Other? No   Any new allergies since your discharge? No   Dietary orders reviewed? Yes  Do you have support at home? Yes   Home Care and Equipment/Supplies: Were home health services ordered? not applicable If so, what is the name of the agency? N/A  Has the agency set up a time to come to the patient's home? not applicable Were any new equipment or medical supplies ordered?  No What is the name of the medical supply agency? N/A Were you able to get the supplies/equipment? not applicable Do you have any questions related to the use of the equipment or supplies? No  Functional Questionnaire: (I = Independent and D = Dependent) ADLs: I  Bathing/Dressing- I  Meal Prep- I  Eating- I  Maintaining continence- I  Transferring/Ambulation- I with walker  Managing Meds- I  Follow up appointments reviewed:   PCP Hospital f/u appt confirmed? Yes  Scheduled to see Dr. Ethlyn Gallery  on 11/27/2019 @ 11:00 am.  [facility] f/u appt confirmed? Yes  Scheduled to see Dr.Geunther on 12/17/2019 @ 9:30 am.  Are transportation arrangements needed? No   If their condition worsens, is the pt aware to call PCP or go to the Emergency Dept.? Yes  Was the patient provided with contact information for the PCP's office or ED? Yes  Was to pt encouraged to call back with questions or concerns? Yes

## 2019-11-22 ENCOUNTER — Emergency Department (HOSPITAL_COMMUNITY)
Admission: EM | Admit: 2019-11-22 | Discharge: 2019-11-22 | Disposition: A | Discharge status: Home / Self Care | Payer: Medicare Other | Attending: Emergency Medicine | Admitting: Emergency Medicine

## 2019-11-22 ENCOUNTER — Encounter (HOSPITAL_COMMUNITY): Payer: Self-pay | Admitting: Emergency Medicine

## 2019-11-22 DIAGNOSIS — J45909 Unspecified asthma, uncomplicated: Secondary | ICD-10-CM | POA: Insufficient documentation

## 2019-11-22 DIAGNOSIS — K625 Hemorrhage of anus and rectum: Secondary | ICD-10-CM

## 2019-11-22 DIAGNOSIS — E039 Hypothyroidism, unspecified: Secondary | ICD-10-CM | POA: Insufficient documentation

## 2019-11-22 DIAGNOSIS — I10 Essential (primary) hypertension: Secondary | ICD-10-CM | POA: Diagnosis not present

## 2019-11-22 DIAGNOSIS — K922 Gastrointestinal hemorrhage, unspecified: Secondary | ICD-10-CM | POA: Diagnosis present

## 2019-11-22 LAB — COMPREHENSIVE METABOLIC PANEL
ALT: 15 U/L (ref 0–44)
AST: 24 U/L (ref 15–41)
Albumin: 3.5 g/dL (ref 3.5–5.0)
Alkaline Phosphatase: 41 U/L (ref 38–126)
Anion gap: 8 (ref 5–15)
BUN: 12 mg/dL (ref 8–23)
CO2: 24 mmol/L (ref 22–32)
Calcium: 9.6 mg/dL (ref 8.9–10.3)
Chloride: 102 mmol/L (ref 98–111)
Creatinine, Ser: 0.67 mg/dL (ref 0.44–1.00)
GFR, Estimated: >60 mL/min (ref >60)
Glucose, Bld: 100 mg/dL — ABNORMAL HIGH (ref 70–99)
Potassium: 4.2 mmol/L (ref 3.5–5.1)
Sodium: 134 mmol/L — ABNORMAL LOW (ref 135–145)
Total Bilirubin: 0.5 mg/dL (ref 0.3–1.2)
Total Protein: 7 g/dL (ref 6.5–8.1)

## 2019-11-22 LAB — CBC
HCT: 32.4 % — ABNORMAL LOW (ref 36.0–46.0)
Hemoglobin: 10.1 g/dL — ABNORMAL LOW (ref 12.0–15.0)
MCH: 29.1 pg (ref 26.0–34.0)
MCHC: 31.2 g/dL (ref 30.0–36.0)
MCV: 93.4 fL (ref 80.0–100.0)
Platelets: 387 10*3/uL (ref 150–400)
RBC: 3.47 MIL/uL — ABNORMAL LOW (ref 3.87–5.11)
RDW: 14.9 % (ref 11.5–15.5)
WBC: 6.9 10*3/uL (ref 4.0–10.5)
nRBC: 0 % (ref 0.0–0.2)

## 2019-11-22 LAB — TYPE AND SCREEN
ABO/RH(D): O NEG
Antibody Screen: NEGATIVE

## 2019-11-22 MED ORDER — SODIUM CHLORIDE 0.9 % IV BOLUS
1000.0000 mL | Freq: Once | INTRAVENOUS | Status: AC
  Administered 2019-11-22: 1000 mL via INTRAVENOUS

## 2019-11-22 NOTE — ED Triage Notes (Signed)
Pt reports hx of gi bleed on and off for a few years, released from [place] on wed, started having BRB from rectum again this morning, denies blood thinners. A/ox4.

## 2019-11-22 NOTE — ED Provider Notes (Signed)
[facility] Emergency Department Provider Note MRN:  [id number]  [place] date & time: 11/22/19     Chief Complaint   GI Bleeding   History of Present Illness   [name] is a 85 y.o. year-old female with a history of COPD, diverticulitis presenting to the ED with chief complaint of GI bleeding.  Return of bright red blood per rectum this morning with bowel movements.  Recent admission for GI bleed.  Estimated 3 tablespoons of bright red blood loss this morning, no rectal pain but endorsing mild lower abdominal cramping.  Denies neck pain, no headache, no chest pain, no shortness of breath, no fever, no other symptoms.  Review of Systems  A complete 10 system review of systems was obtained and all systems are negative except as noted in the HPI and PMH.   Patient's Health History    Past Medical History:  Diagnosis Date  . ABDOMINAL PAIN, CHRONIC 05/28/2008  . ALLERGIC RHINITIS 09/21/2006  . ANEMIA-NOS 09/21/2006  . ASTHMA 09/21/2006  . ASYMPTOMATIC POSTMENOPAUSAL STATUS 05/28/2008  . Cataract   . CHEST PAIN, ATYPICAL 05/28/2008  . COPD (chronic obstructive pulmonary disease) ([place])   . DDD (degenerative disc disease)   . DIVERTICULITIS, HX OF 09/21/2006  . GERD 09/21/2006  . HYPERCHOLESTEROLEMIA 06/05/2009  . HYPERTENSION 09/21/2006  . HYPOTHYROIDISM 09/21/2006  . Peptic ulcer disease   . Purpura ([place])   . RENAL INSUFFICIENCY 09/21/2006  . Shingles   . Small bowel obstruction ([place])   . Somatization disorder 04/25/2007  . UNSPECIFIED URINARY CALCULUS 05/28/2008  . UTI (urinary tract infection) 07/2019    Past Surgical History:  Procedure Laterality Date  . ABDOMINAL HYSTERECTOMY  1981  . acute Nephritis  1958  . APPENDECTOMY  1959  . BIOPSY  11/18/2019   Procedure: BIOPSY;  Surgeon: Ladene Artist, MD;  Location: [facility];  Service: Endoscopy;;  . CESAREAN SECTION    . CHOLECYSTECTOMY  1983  . COLON SURGERY    . DENTAL SURGERY  1970  .  FLEXIBLE SIGMOIDOSCOPY N/A 11/18/2019   Procedure: FLEXIBLE SIGMOIDOSCOPY;  Surgeon: Ladene Artist, MD;  Location: [facility];  Service: Endoscopy;  Laterality: N/A;  . NASAL SINUS SURGERY  1967  . Pulmonary thrombosis    . TONSILLECTOMY    . TUBAL LIGATION      Family History  Problem Relation Age of Onset  . Polycystic kidney disease Mother   . Pancreatic cancer Mother   . Other Father        Schamberg disease  . Bladder Cancer Father   . Hypertension Father   . Marfan syndrome Son   . Hemochromatosis Son   . Cirrhosis Son   . Allergic rhinitis Sister   . Other Brother        bone issue as child; multiple fractures but seemed to age out of this  . Hemochromatosis Cousin   . Arthritis Sister     Social History   Socioeconomic History  . Marital status: Divorced    Spouse name: Not on file  . Number of children: 3  . Years of education: Not on file  . Highest education level: Not on file  Occupational History  . Occupation: retired  Tobacco Use  . Smoking status: Never Smoker  . Smokeless tobacco: Never Used  Vaping Use  . Vaping Use: Never used  Substance and Sexual Activity  . Alcohol use: No  . Drug use: No  . Sexual activity: Not Currently  Other Topics Concern  . Not on file  Social History Narrative  . Not on file   Social Determinants of Health   Financial Resource Strain:   . Difficulty of Paying Living Expenses: Not on file  Food Insecurity:   . Worried About Charity fundraiser in the Last Year: Not on file  . Ran Out of Food in the Last Year: Not on file  Transportation Needs:   . Lack of Transportation (Medical): Not on file  . Lack of Transportation (Non-Medical): Not on file  Physical Activity:   . Days of Exercise per Week: Not on file  . Minutes of Exercise per Session: Not on file  Stress:   . Feeling of Stress : Not on file  Social Connections:   . Frequency of Communication with Friends and Family: Not on file  . Frequency of  Social Gatherings with Friends and Family: Not on file  . Attends Religious Services: Not on file  . Active Member of Clubs or Organizations: Not on file  . Attends Archivist Meetings: Not on file  . Marital Status: Not on file  Intimate Partner Violence:   . Fear of Current or Ex-Partner: Not on file  . Emotionally Abused: Not on file  . Physically Abused: Not on file  . Sexually Abused: Not on file     Physical Exam   Vitals:   11/22/19 1230 11/22/19 1324  BP: (!) 158/120 (!) 138/57  Pulse: 77 74  Resp: 13 15  Temp:  98.1 F (36.7 C)  SpO2: 100% 98%    CONSTITUTIONAL: Well-appearing, NAD NEURO:  Alert and oriented x 3, no focal deficits EYES:  eyes equal and reactive ENT/NECK:  no LAD, no JVD CARDIO: Regular rate, well-perfused, normal S1 and S2 PULM:  CTAB no wheezing or rhonchi GI/GU:  normal bowel sounds, non-distended, non-tender MSK/SPINE:  No gross deformities, no edema SKIN:  no rash, atraumatic PSYCH:  Appropriate speech and behavior  *Additional and/or pertinent findings included in MDM below  Diagnostic and Interventional Summary    EKG Interpretation  Date/Time:    Ventricular Rate:    PR Interval:    QRS Duration:   QT Interval:    QTC Calculation:   R Axis:     Text Interpretation:        Labs Reviewed  COMPREHENSIVE METABOLIC PANEL - Abnormal; Notable for the following components:      Result Value   Sodium 134 (*)    Glucose, Bld 100 (*)    All other components within normal limits  CBC - Abnormal; Notable for the following components:   RBC 3.47 (*)    Hemoglobin 10.1 (*)    HCT 32.4 (*)    All other components within normal limits  POC OCCULT BLOOD, ED  TYPE AND SCREEN    No orders to display    Medications  sodium chloride 0.9 % bolus 1,000 mL (0 mLs Intravenous Stopped 11/22/19 1235)     Procedures  /  Critical Care Procedures  ED Course and Medical Decision Making  I have reviewed the triage vital signs, the  nursing notes, and pertinent available records from the EMR.  Listed above are laboratory and imaging tests that I personally ordered, reviewed, and interpreted and then considered in my medical decision making (see below for details).  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  Recently started on mesalamine, has taken  for the past 2 days.  Overall she is in no acute distress, benign abdomen, normal vital signs, she has been feeling generally weak and had some transient lightheadedness today, providing IV fluids.  Will discuss with gastroenterology to determine whether she can be discharged with close follow-up and give these new medications time to work or if she needs admission.     Discussed case with GI, specifically with recommendations for Dr. Elmo Putt.  Some small amount of bleeding for the next 1 or 2 weeks is anticipated, patient is advised to continue her new mesalamine medications.  Appropriate for discharge.  Barth Kirks. Sedonia Small, MD [place] [email or web address]  Final Clinical Impressions(s) / ED Diagnoses     ICD-10-CM   1. Rectal bleeding  K62.5     ED Discharge Orders    None       Discharge Instructions Discussed with and Provided to Patient:     Discharge Instructions     You were evaluated in the Emergency Department and after careful evaluation, we did not find any emergent condition requiring admission or further testing in the hospital.  Your exam/testing today was overall reassuring.  We discussed your case with the [facility] gastroenterology team.  Some small amount of rectal bleeding is to be expected intermittently over the next week as you have only been on the new medications for a few days.  The GI team will be contacting you for close follow-up.  Please return to the Emergency Department if you experience any worsening of your  condition.  Thank you for allowing [place] to be a part of your care.        Maudie Flakes, MD 11/22/19 1524

## 2019-11-22 NOTE — Discharge Instructions (Addendum)
You were evaluated in the Emergency Department and after careful evaluation, we did not find any emergent condition requiring admission or further testing in the hospital.  Your exam/testing today was overall reassuring.  We discussed your case with the [facility] gastroenterology team.  Some small amount of rectal bleeding is to be expected intermittently over the next week as you have only been on the new medications for a few days.  The GI team will be contacting you for close follow-up.  Please return to the Emergency Department if you experience any worsening of your condition.  Thank you for allowing [place] to be a part of your care.

## 2019-11-27 ENCOUNTER — Encounter: Payer: Self-pay | Admitting: Family Medicine

## 2019-11-27 ENCOUNTER — Other Ambulatory Visit: Payer: Self-pay

## 2019-11-27 ENCOUNTER — Ambulatory Visit (INDEPENDENT_AMBULATORY_CARE_PROVIDER_SITE_OTHER): Payer: Medicare Other | Admitting: Family Medicine

## 2019-11-27 VITALS — BP 122/80 | HR 84 | Temp 98.4°F | Ht 65.0 in

## 2019-11-27 DIAGNOSIS — K6389 Other specified diseases of intestine: Secondary | ICD-10-CM

## 2019-11-27 DIAGNOSIS — I10 Essential (primary) hypertension: Secondary | ICD-10-CM | POA: Diagnosis not present

## 2019-11-27 DIAGNOSIS — E039 Hypothyroidism, unspecified: Secondary | ICD-10-CM

## 2019-11-27 DIAGNOSIS — E559 Vitamin D deficiency, unspecified: Secondary | ICD-10-CM | POA: Diagnosis not present

## 2019-11-27 DIAGNOSIS — D692 Other nonthrombocytopenic purpura: Secondary | ICD-10-CM

## 2019-11-27 DIAGNOSIS — Z13220 Encounter for screening for lipoid disorders: Secondary | ICD-10-CM

## 2019-11-27 NOTE — Progress Notes (Signed)
JIN SHOCKLEY DOB: April 14, 1934 Encounter date: 11/27/2019  This is a 85 y.o. female who presents with Chief Complaint  Patient presents with  . Hospitalization Follow-up    patient complains of recurrent weakness    History of present illness:  Patient was admitted on 11/16/2019 and discharged on 11/20/2019 with complaints of 3 days of lower abdominal pain and bright red bleeding per rectum.  Additionally, she has had generalized fatigue.  Hemoglobin is 10.7 and fecal occult was positive.  CT abdomen pelvis showed inflammatory changes in the rectosigmoid area likely proctitis.  Biopsy for GI showed chronic active colitis, no granuloma.  She was found to have moderate diverticulosis in the sigmoid colon.  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  She was started on mesalamine and  recommended to take a soft/gastroparesis diet (which she has been doing for years)  She did return to the emergency room on 10/15 for bright red blood per rectum.  This was thought to be normal considering recent hospitalization and she was instructed to continue with mesalamine.  Yesterday had one little tinge of blood; none today. Usually just seeing it in the morning. Follows back up with gastro on 11/9. Appetite is good. Always having snacks and good about drinking. She has been following gastroparesis diet for 6 years; but lately has been given papers with more items on it. She is happy to start trying some additional foods.   Taking iron, B12 on occasion. Losing hair this week.  Energy level is poor.   Breathing Is feeling fine.   Mouth is always sore. Some days worse than others.    Allergies  Allergen Reactions  . [place] Nut (Berthollefia [place]) Skin Test Anaphylaxis  . Fluconazole Shortness Of Breath  . Metronidazole Shortness Of Breath and Nausea And Vomiting  . Pepcid [Famotidine] Shortness Of Breath and Other (See Comments)     Dizziness  . Shellfish Allergy Anaphylaxis  . Macrobid [Nitrofurantoin]     REACTION: Syncope  . Pantoprazole Other (See Comments)    CHEST PAIN   Current Meds  Medication Sig  . acetaminophen (TYLENOL) 325 MG tablet Take 650 mg by mouth every 4 (four) hours as needed for mild pain or headache.   . Cholecalciferol (VITAMIN D3 PO) Take 1 tablet by mouth 2 (two) times a week. Saturday and Sunday  . Ferrous Sulfate (IRON) 325 (65 FE) MG TABS Take 1 tablet by mouth daily.   . fluticasone (FLOVENT HFA) 110 MCG/ACT inhaler TAKE 1 PUFF BY MOUTH TWICE A DAY (Patient taking differently: Inhale 1 puff into the lungs in the morning and at bedtime. )  . levothyroxine (SYNTHROID) 50 MCG tablet Take 1 tablet (50 mcg total) by mouth every Monday, Tuesday, Wednesday, Thursday, and Friday.  . levothyroxine (SYNTHROID) 75 MCG tablet Take 1 tablet (75 mcg total) by mouth as directed. Take by mouth 2 days per week (Sat & Sun)  . mesalamine (CANASA) 1000 MG suppository Place 1 suppository (1,000 mg total) rectally at bedtime.  . mesalamine (LIALDA) 1.2 g EC tablet Take 2 tablets (2.4 g total) by mouth daily with breakfast.  . senna-docusate (SENOKOT-S) 8.6-50 MG tablet Take 1 tablet by mouth at bedtime as needed for mild constipation.  Marland Kitchen Spacer/Aero-Holding Chambers (E-Z SPACER) inhaler Use as instructed    Review of Systems  Constitutional: Positive for fatigue. Negative for chills and fever.  HENT: Positive for mouth sores (chronic, recurrent).   Respiratory: Negative for cough,  chest tightness, shortness of breath and wheezing.   Cardiovascular: Negative for chest pain, palpitations and leg swelling.  Gastrointestinal:       Blood in stool; improved today  Skin: Positive for rash (continues to get hyperpigmented lesions on legs; skin very tender).    Objective:  BP 122/80 (BP Location: Left Arm, Patient Position: Sitting, Cuff Size: Normal)   Pulse 84   Temp 98.4 F (36.9 C) (Oral)   Ht 5' 5"   (1.651 m)   SpO2 96%   BMI 23.80 kg/m       BP Readings from Last 3 Encounters:  11/27/19 122/80  11/22/19 (!) 138/57  11/20/19 118/70   Wt Readings from Last 3 Encounters:  11/22/19 143 lb (64.9 kg)  11/16/19 143 lb (64.9 kg)  10/31/19 143 lb (64.9 kg)    Physical Exam Constitutional:      General: She is not in acute distress.    Appearance: She is well-developed.  Cardiovascular:     Rate and Rhythm: Normal rate and regular rhythm.     Heart sounds: Normal heart sounds. No murmur heard.  No friction rub.  Pulmonary:     Effort: Pulmonary effort is normal. No respiratory distress.     Breath sounds: Normal breath sounds. No wheezing or rales.  Abdominal:     General: Abdomen is flat. Bowel sounds are normal. There is no distension.     Tenderness: There is no abdominal tenderness. There is no guarding.  Musculoskeletal:     Right lower leg: No edema.     Left lower leg: No edema.  Neurological:     Mental Status: She is alert and oriented to person, place, and time.  Psychiatric:        Behavior: Behavior normal.        Thought Content: Thought content normal.     Assessment/Plan  1. Proctosigmoiditis Symptoms are improving with this.  She does have follow-up with GI.  She is eating better at home.  She is taking the mesalamine as directed.  2. Essential hypertension Blood pressure stable.  Currently not on any blood pressure medications. - CBC with Differential/Platelet; Future  3. Hypothyroidism, unspecified type Currently on Synthroid, thyroid is not checked recently in the hospital, so we will plan to recheck this with blood work in a week or so.  She is taking 75 mcg 2 days a week and 50 mcg 5 days a week - TSH; Future  4. Vitamin D deficiency She does take vitamin D replacement.  Vitamin D levels were normal when checked in hospital.  5. Lipid screening - Lipid panel; Future  6. 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  Going to check back with her about this.  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  In the past, I had understood that she had been told she had purpura, and she does continue to get hyperpigmented lesions as a result of this on her legs.  I would certainly consider work-up for porphyria pending her response to this question.    Since she did worry about feeling weak, especially after being hospitalized as long as she was, I encouraged her to consider in-home therapy to work on strength.  She feels that she is doing okay and having good improvement at  this point.  I have asked her to reach out if she feels like she wants more help in this regard.  Her son is present with her at this visit and states that she has had a lot of of complaints without much improvement in the last number of years.  Although she is very self-sufficient, she does struggle daily with mouth pain, skin pain and nausea.  Return in about 2 weeks (around 12/11/2019) for bloodwork.    Micheline Rough, MD

## 2019-12-03 ENCOUNTER — Telehealth: Payer: Self-pay | Admitting: *Deleted

## 2019-12-03 NOTE — Telephone Encounter (Signed)
-----   Message from Caren Macadam, MD sent at 12/01/2019  2:57 PM EDT ----- Please check with patient. 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 When I have talked with [name] I thought she had said purpura. IF she said porphyria, I need to know when/who diagnosed this in case we are able to get records; but if we cannot get records and she states that it was indeed porphyria that she was told she had, then we need to do some testing to confirm. I will order further labs based on this response. (it was only mentioned one time in the chart from Dr. Nolon Rod)

## 2019-12-03 NOTE — Telephone Encounter (Signed)
No answer x2.

## 2019-12-11 ENCOUNTER — Other Ambulatory Visit: Payer: Self-pay

## 2019-12-11 ENCOUNTER — Other Ambulatory Visit: Payer: Medicare Other

## 2019-12-11 DIAGNOSIS — E039 Hypothyroidism, unspecified: Secondary | ICD-10-CM | POA: Diagnosis not present

## 2019-12-11 DIAGNOSIS — Z13220 Encounter for screening for lipoid disorders: Secondary | ICD-10-CM

## 2019-12-11 DIAGNOSIS — I10 Essential (primary) hypertension: Secondary | ICD-10-CM

## 2019-12-12 LAB — CBC WITH DIFFERENTIAL/PLATELET
Absolute Monocytes: 969 cells/uL — ABNORMAL HIGH (ref 200–950)
Basophils Absolute: 67 cells/uL (ref 0–200)
Basophils Relative: 0.9 %
Eosinophils Absolute: 592 cells/uL — ABNORMAL HIGH (ref 15–500)
Eosinophils Relative: 8 %
HCT: 31.1 % — ABNORMAL LOW (ref 35.0–45.0)
Hemoglobin: 10.4 g/dL — ABNORMAL LOW (ref 11.7–15.5)
Lymphs Abs: 1621 cells/uL (ref 850–3900)
MCH: 31.6 pg (ref 27.0–33.0)
MCHC: 33.4 g/dL (ref 32.0–36.0)
MCV: 94.5 fL (ref 80.0–100.0)
MPV: 10.6 fL (ref 7.5–12.5)
Monocytes Relative: 13.1 %
Neutro Abs: 4151 cells/uL (ref 1500–7800)
Neutrophils Relative %: 56.1 %
Platelets: 380 10*3/uL (ref 140–400)
RBC: 3.29 10*6/uL — ABNORMAL LOW (ref 3.80–5.10)
RDW: 13.5 % (ref 11.0–15.0)
Total Lymphocyte: 21.9 %
WBC: 7.4 10*3/uL (ref 3.8–10.8)

## 2019-12-12 LAB — LIPID PANEL
Cholesterol: 170 mg/dL (ref <200)
HDL: 56 mg/dL (ref >=50)
LDL Cholesterol (Calc): 87 mg/dL (calc)
Non-HDL Cholesterol (Calc): 114 mg/dL (calc) (ref <130)
Total CHOL/HDL Ratio: 3 (calc) (ref <5.0)
Triglycerides: 178 mg/dL — ABNORMAL HIGH (ref <150)

## 2019-12-12 LAB — TSH: TSH: 2.81 mIU/L (ref 0.40–4.50)

## 2019-12-17 ENCOUNTER — Encounter: Payer: Self-pay | Admitting: Nurse Practitioner

## 2019-12-17 ENCOUNTER — Ambulatory Visit (INDEPENDENT_AMBULATORY_CARE_PROVIDER_SITE_OTHER): Payer: Medicare Other | Admitting: Nurse Practitioner

## 2019-12-17 VITALS — BP 126/78 | HR 90 | Ht 65.0 in | Wt 142.6 lb

## 2019-12-17 DIAGNOSIS — K6389 Other specified diseases of intestine: Secondary | ICD-10-CM

## 2019-12-17 DIAGNOSIS — D649 Anemia, unspecified: Secondary | ICD-10-CM | POA: Diagnosis not present

## 2019-12-17 MED ORDER — MESALAMINE 1.2 G PO TBEC: 2.4000 g | DELAYED_RELEASE_TABLET | Freq: Every day | ORAL | 3 refills | Status: DC

## 2019-12-17 NOTE — Progress Notes (Signed)
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 Diffuse moderate inflammation was found in the rectum and in the sigmoid colon to 20 cm.  Biopsies compatible with chronic active colitis --No further rectal bleeding  / abdominal cramping after starting oral mesalamine and Rowasa enemas.  --Patient has completed nearly a month of Rowasa enemas which I think can be discontinued at this time.  Continue mesalamine 2.4 g daily --Follow up in 3 months with me or Dr. Silverio Decamp. She will call office if she has recurrent rectal bleeding in the interim.    # Hx SBO requiring exploratory lap / LOA, small bowel resection in 2012. It seems patient is relating this surgery to a colonoscopy complication but I do not see where she had a colonoscopy around 2012. But there doesn't appear to have been any complications related to the colonoscopy. Regardless, patient is hesitant to ever undergo another full colonoscopy.     # Chronic normocytic anemia. On chronic oral iron. Baseline hgb around 12 but it started to decline around April 2021 when seen in ED for rectal bleeding.  She continued to have intermittent bleeding which we now know was likely related to  proctosigmoiditis.   Not iron deficient based on iron studies last month.   HISTORY OF PRESENT ILLNESS     Primary Gastroenterologist :  Harl Bowie, MD   Chief Complaint : Hospital follow-up  [name] is a 85 y.o. female with PMH / [place] significant for,  but not necessarily limited to: Diverticulitis, hiatal hernia, remote gastritis, GERD, hyperlipidemia, hypertension, SBO, COPD, cholecystectomy, appendectomy  In 2007 patient underwent colonoscopy by Dr. Henrene Pastor.  The exam was complete but prep was poor significantly limiting visualization.  No biopsies were taken.  No polyps found.  Patient says that she developed bowel perforation following the procedure and required surgery.    In 2015 she  was referred to [place] for heme positive stool and anemia.  She agreed to endoscopic work-up but wanted Dr. Deatra Ina to perform the procedures.  EGD remarkable for esophageal stricture and gastritis.  Complete colonoscopy was carried out with findings of diverticulosis and abnormal rectal mucosa.  Gastric biopsies compatible with slight chronic gastritis, no H. pylori.  Rectal biopsies were negative for adenomatous change or malignancy  Patient was seen again in May 2021 after having been to the ED and diagnosed with what was felt to be uncomplicated acute diverticulitis.  She had completed antibiotics prescribed by the ED but was still having some lower abdominal discomfort.  Also mentioned some blood when she wiped after BM.  She was given an additional course of antibiotics.  Anoscopy for rectal bleeding showed inflamed internal hemorrhoids so she was given a course of Anusol.  Patient was reluctant to undergo colonoscopy given what she described as perforation following a colonoscopy in 2007.   Patient returnedfor follow-up late May 2021, abdominal pain had resolved after the additional antibiotics.  Rectal bleeding had resolved . She was scheduled for repeat CT to make sure colonic findings had resolved and to evaluate for any gross colonic lesions given reluctance to proceed with colonoscopy.  Follow-up CT scan 07/25/2019 negative for acute diverticulitis.  There were several diverticula and mild bowel wall thickening of the sigmoid colon, possibly underdistention. Large sliding-type hiatal hernia.  Patient was seen again in the office late September with LLQ pain developing over the prior 3 days.  Patient was concerned about recurrent diverticulitis.  She was treated with 10 days of Augmentin.    On 11/16/2019 patient presented to the ED for rectal bleeding and abdominal cramping.  CT scan suggested the diffuse inflammatory changes of the rectosigmoid, likely proctitis. Inpatient flexible sigmoidoscopy  concerning for proctosigmoid colitis, left-sided diverticulosis associated with narrowing.  Biopsies compatible with chronic active colitis.  Patient was started on oral mesalamine and Canasa suppositories.    Interval History:  Patient is here for hospital follow-up . She completed almost a month of canasa suppositories and is still taking Mesalamine 2.4 grams daily. No bleeding since hospital discharge. Bowel movements are normal. Stools are formed, not hard. No abdominal pain.  Weight is stable.   Data Reviewed: 11/17/19 Hemoglobin 10.1,  ferritin 51 TIBC 310, 16% iron saturation  12/11/19 Hemoglobin 10.4  Previous Endoscopic Evaluations / Pertinent Studies:   11/18/19 flexible sigmoidoscopy -Preparation of the colon was unsatisfactory above 35 cm and fair below 35 cm. - Moderate diverticulosis in the sigmoid colon. There was narrowing of the colon in association with the diverticular opening. There was evidence of diverticular spasm. There was no evidence of diverticular bleeding. - Diffuse moderate inflammation was found in the rectum and in the sigmoid colon to 20 cm secondary to proctosigmoid colitis concerning for ulcerative proctosigmoiditis. Biopsied. - The examination was otherwise normal  COLON, SIGMOID AND RECTUM, BIOPSY:  - Chronic active colitis  - No granulomata, dysplasia or malignancy identified  - See comment  COMMENT:  The findings are consistent with inflammatory bowel disease in the  appropriate clinical setting; clinical correlation recommended.   July 2015 EGD and colonoscopy for heme positive stool and anemia --Esophageal stricture and gastritis --Colonoscopy was complete with a good prep. Abnormal mucosa in the rectal vault-rule out adenomatous changes versus proctitis - a source for Hemoccult-positive Stool. Diverticulosis  Surgical [P], rectum, polyp - BENIGN LYMPHOID POLYPS. NO ADENOMATOUS CHANGE OR MALIGNANCY. 2. Surgical [P], gastric body,  biopsy - SLIGHT CHRONIC GASTRITIS. NO HELICOBACTER PYLORI, DYSPLASIA OR EVIDENCE OF MALIGNANCY IDENTIFIED   Past Medical History:  Diagnosis Date  . ABDOMINAL PAIN, CHRONIC 05/28/2008  . ALLERGIC RHINITIS 09/21/2006  . ANEMIA-NOS 09/21/2006  . ASTHMA 09/21/2006  . ASYMPTOMATIC POSTMENOPAUSAL STATUS 05/28/2008  . Cataract   . CHEST PAIN, ATYPICAL 05/28/2008  . COPD (chronic obstructive pulmonary disease) ([place])   . DDD (degenerative disc disease)   . DIVERTICULITIS, HX OF 09/21/2006  . GERD 09/21/2006  . HYPERCHOLESTEROLEMIA 06/05/2009  . HYPERTENSION 09/21/2006  . HYPOTHYROIDISM 09/21/2006  . Peptic ulcer disease   . Purpura ([place])   . RENAL INSUFFICIENCY 09/21/2006  . Shingles   . Small bowel obstruction ([place])   . Somatization disorder 04/25/2007  . UNSPECIFIED URINARY CALCULUS 05/28/2008  . UTI (urinary tract infection) 07/2019    Current Medications, Allergies, Past Surgical History, Family History and Social History were reviewed in [employer] record.   Current Outpatient Medications  Medication Sig Dispense Refill  . acetaminophen (TYLENOL) 325 MG tablet Take 650 mg by mouth every 4 (four) hours as needed for mild pain or headache.     . Ferrous Sulfate (IRON) 325 (65 FE) MG TABS Take 1 tablet by mouth daily.     . fluticasone (FLOVENT HFA) 110 MCG/ACT inhaler TAKE 1 PUFF BY MOUTH TWICE A DAY (Patient taking differently: Inhale 1 puff into the lungs 2 (two) times daily. ) 1 each 2  . levothyroxine (SYNTHROID) 50 MCG tablet Take 1 tablet (50 mcg total) by mouth every Monday, Tuesday, Wednesday,  Thursday, and Friday.    . levothyroxine (SYNTHROID) 75 MCG tablet Take 1 tablet (75 mcg total) by mouth as directed. Take by mouth 2 days per week (Sat & Sun)    . mesalamine (CANASA) 1000 MG suppository Place 1 suppository (1,000 mg total) rectally at bedtime. 30 suppository 0  . mesalamine (LIALDA) 1.2 g EC tablet Take 2 tablets (2.4 g total) by mouth daily with  breakfast. 60 tablet 0  . Spacer/Aero-Holding Chambers (E-Z SPACER) inhaler Use as instructed 1 each 2  . Cholecalciferol (VITAMIN D3 PO) Take 1 tablet by mouth 2 (two) times a week. Saturday and Sunday (Patient not taking: Reported on 12/17/2019)     No current facility-administered medications for this visit.    Review of Systems: No chest pain. No shortness of breath. No urinary complaints.   PHYSICAL EXAM :    Wt Readings from Last 3 Encounters:  12/17/19 142 lb 9.6 oz (64.7 kg)  11/22/19 143 lb (64.9 kg)  11/16/19 143 lb (64.9 kg)    BP 126/78   Pulse 90   Ht 5' 5"  (1.651 m)   Wt 142 lb 9.6 oz (64.7 kg)   SpO2 96%   BMI 23.73 kg/m  Constitutional:  Pleasant female in no acute distress. Psychiatric: Normal mood and affect. Behavior is normal. EENT: Pupils normal.  Conjunctivae are normal. No scleral icterus. Neck supple.  Cardiovascular: Normal rate, regular rhythm. No edema Pulmonary/chest: Effort normal and breath sounds normal. No wheezing, rales or rhonchi. Abdominal: Soft, nondistended, nontender. Bowel sounds active throughout. There are no masses palpable. No hepatomegaly. Neurological: Alert and oriented to person place and time. Skin: Skin is warm and dry. No rashes noted.  I spent 30 minutes total reviewing records, obtaining history, performing exam, counseling patient and documenting visit / findings.     [name], NP  12/17/2019, 9:41 AM

## 2019-12-17 NOTE — Patient Instructions (Signed)
If you are age 83 or older, your body mass index should be between 23-30. Your Body mass index is 23.73 kg/m. If this is out of the aforementioned range listed, please consider follow up with your Primary Care Provider.  If you are age 41 or younger, your body mass index should be between 19-25. Your Body mass index is 23.73 kg/m. If this is out of the aformentioned range listed, please consider follow up with your Primary Care Provider.    Stop Canasa.   Continue Mesalamine 2.4 g daily.

## 2019-12-19 ENCOUNTER — Telehealth: Payer: Self-pay | Admitting: *Deleted

## 2019-12-19 NOTE — Telephone Encounter (Signed)
-----   Message from Caren Macadam, MD sent at 12/01/2019  2:57 PM EDT ----- Please check with patient. 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 When I have talked with [name] I thought she had said purpura. IF she said porphyria, I need to know when/who diagnosed this in case we are able to get records; but if we cannot get records and she states that it was indeed porphyria that she was told she had, then we need to do some testing to confirm. I will order further labs based on this response. (it was only mentioned one time in the chart from Dr. Nolon Rod)

## 2019-12-19 NOTE — Telephone Encounter (Signed)
Patient informed of the message below. Patient stated she was diagnosed with purpura years ago by a dermatologist at the [facility].  Stated at the time Dr Loanne Drilling was her PCP and he did not refer her to anyone as there is no treatment for this.  I spoke with Sophie at the [facility] and she stated she will fax the records from 2016-2017 for Dr Ethlyn Gallery to review.  Message sent to PCP.

## 2019-12-20 NOTE — Telephone Encounter (Signed)
Noted. Thanks.

## 2019-12-29 ENCOUNTER — Other Ambulatory Visit: Payer: Self-pay | Admitting: Family Medicine

## 2019-12-29 DIAGNOSIS — R109 Unspecified abdominal pain: Secondary | ICD-10-CM

## 2019-12-29 DIAGNOSIS — L989 Disorder of the skin and subcutaneous tissue, unspecified: Secondary | ICD-10-CM

## 2019-12-30 ENCOUNTER — Other Ambulatory Visit: Payer: Self-pay | Admitting: Family Medicine

## 2019-12-31 ENCOUNTER — Telehealth: Payer: Self-pay | Admitting: Nurse Practitioner

## 2019-12-31 NOTE — Telephone Encounter (Signed)
Called and spoke with patient. She is wanting to know if she can get another round of the Mesalamine (Canasa) Suppositories that she was discharged from the hospital with?

## 2020-01-01 ENCOUNTER — Telehealth: Payer: Self-pay | Admitting: *Deleted

## 2020-01-01 NOTE — Telephone Encounter (Signed)
-----   Message from Margaret Macadam, MD sent at 12/29/2019 12:42 PM EST ----- Note from surgical center was reviewed and in this note dermatology PA that she saw, Abigail, did not think that skin lesions were related to purpura.   I am  going to order a couple of urinary studies on her because I wonder with her symptoms if there is not actually something called porphyria, which is not very common, but comes with abdominal pain episodes, sometimes severe, can affect skin and cause skin lesions similar to what she has shown me in the past, can cause changes with urination and issues with kidneys/liver. I think it is worthwhile getting these with her long history of multiple system complaints. She can set up lab visit at convenience to complete.   Micheline Rough, MD

## 2020-01-01 NOTE — Telephone Encounter (Signed)
Spoke with the pt and informed her urine tests are needed as below.  Patient stated due to urine problems,  she is not able to actually collect a urine here and will bring a sample in from her home.  Lab appt scheduled for 11/30 per pts preference and message sent to PCP.

## 2020-01-06 NOTE — Telephone Encounter (Signed)
Beth, please call patient to get more information. She should be taking oral mesalamine. If she having diarrhea / blood?  Why does she want to / need suppositories again ? Thanks

## 2020-01-06 NOTE — Telephone Encounter (Signed)
Called the patient. She said 6 days ago she saw "little tinges of blood" when she wiped. She has not seen any since. She is taking the oral mesalamine. She was being cautious.

## 2020-01-07 ENCOUNTER — Other Ambulatory Visit: Payer: Self-pay

## 2020-01-07 ENCOUNTER — Other Ambulatory Visit: Payer: Medicare Other

## 2020-01-07 DIAGNOSIS — L989 Disorder of the skin and subcutaneous tissue, unspecified: Secondary | ICD-10-CM

## 2020-01-07 DIAGNOSIS — R82998 Other abnormal findings in urine: Secondary | ICD-10-CM

## 2020-01-07 DIAGNOSIS — R109 Unspecified abdominal pain: Secondary | ICD-10-CM

## 2020-01-07 MED ORDER — MESALAMINE 1000 MG RE SUPP: 1000.0000 mg | Freq: Every day | RECTAL | 0 refills | Status: DC

## 2020-01-07 NOTE — Telephone Encounter (Signed)
Patient is advised and agrees with this plan of care. Rx to [facility] on [address].

## 2020-01-07 NOTE — Telephone Encounter (Signed)
Ok, two more weeks worth. Insert one Q HS x 14 days. If continues to have problems then needs to come in as Mesalamine may not be adequate. Thanks

## 2020-01-14 ENCOUNTER — Telehealth: Payer: Self-pay | Admitting: Family Medicine

## 2020-01-14 LAB — PORPHYRINS, FRACTIONATED, RANDOM URINE
COPROPORPHYRIN I: 43.8 mcg/g creat — ABNORMAL HIGH (ref 6.5–33.2)
Coproporphyrin III (PORFRU): 109 mcg/g creat — ABNORMAL HIGH (ref 4.8–88.6)
HEXACARBOXYPORPHYRIN: 2.1 mcg/g creat (ref <=6.3)
TOTAL PORPHYRINS: 189.7 mcg/g creat — ABNORMAL HIGH (ref 27.0–153.6)
Uroporphyrin I: 31.9 mcg/g creat — ABNORMAL HIGH (ref 3.6–21.1)
Uroporphyrin III (PORFRU): 2.9 mcg/g creat (ref <=5.6)

## 2020-01-14 NOTE — Progress Notes (Signed)
Reviewed and agree with documentation and assessment and plan. K. Veena Ki Luckman , MD   

## 2020-01-14 NOTE — Telephone Encounter (Signed)
Pt is calling to get her lab results from last week.

## 2020-01-15 ENCOUNTER — Other Ambulatory Visit: Payer: Self-pay | Admitting: Nurse Practitioner

## 2020-01-15 NOTE — Telephone Encounter (Signed)
Pt returned the call to the office. 

## 2020-01-15 NOTE — Telephone Encounter (Signed)
See results note. 

## 2020-01-16 NOTE — Telephone Encounter (Signed)
Patient informed of the results

## 2020-01-22 ENCOUNTER — Other Ambulatory Visit: Payer: Self-pay

## 2020-01-22 ENCOUNTER — Ambulatory Visit (INDEPENDENT_AMBULATORY_CARE_PROVIDER_SITE_OTHER): Payer: Medicare Other

## 2020-01-22 ENCOUNTER — Other Ambulatory Visit: Payer: Self-pay | Admitting: Family Medicine

## 2020-01-22 VITALS — BP 122/76 | HR 43 | Temp 98.0°F | Ht 65.0 in | Wt 142.2 lb

## 2020-01-22 DIAGNOSIS — Z780 Asymptomatic menopausal state: Secondary | ICD-10-CM

## 2020-01-22 DIAGNOSIS — Z0000 Encounter for general adult medical examination without abnormal findings: Secondary | ICD-10-CM

## 2020-01-22 NOTE — Progress Notes (Signed)
Subjective:   [name] is a 85 y.o. female who presents for Medicare Annual (Subsequent) preventive examination.  Review of Systems    N/A  Cardiac Risk Factors include: advanced age (>28mn, >>34women);hypertension;dyslipidemia     Objective:    Today's Vitals   01/22/20 1115 01/22/20 1125  BP: 122/76   Pulse: (!) 43   Temp: 98 F (36.7 C)   TempSrc: Oral   SpO2: 94%   Weight: 142 lb 3 oz (64.5 kg)   Height: 5' 5"  (1.651 m)   PainSc:  5    Body mass index is 23.66 kg/m.  Advanced Directives 01/22/2020 11/17/2019 11/16/2019 08/07/2019 08/15/2016 12/01/2015 10/01/2015  Does Patient Have a Medical Advance Directive? Yes Yes Yes Yes Yes No Yes  Type of AParamedicof AS[place]Living will [place]Living will [place] will [place] will Living will - Living will  Does patient want to make changes to medical advance directive? No - Patient declined No - Patient declined - No - Patient declined - - -  Copy of [place]in Chart? No - copy requested No - copy requested No - copy requested No - copy requested - - -  Would patient like information on creating a medical advance directive? - - - - - No - patient declined information -    Current Medications (verified) Outpatient Encounter Medications as of 01/22/2020  Medication Sig   acetaminophen (TYLENOL) 325 MG tablet Take 650 mg by mouth every 4 (four) hours as needed for mild pain or headache.    Cholecalciferol (VITAMIN D3 PO) Take 1 tablet by mouth 2 (two) times a week. Saturday and Sunday   Ferrous Sulfate (IRON) 325 (65 FE) MG TABS Take 1 tablet by mouth daily.    fluticasone (FLOVENT HFA) 110 MCG/ACT inhaler INHALE 1 PUFF BY MOUTH TWICE A DAY   levothyroxine (SYNTHROID) 50 MCG tablet Take 1 tablet (50 mcg total) by mouth every Monday, Tuesday, Wednesday, Thursday, and Friday.   levothyroxine (SYNTHROID)  75 MCG tablet Take 1 tablet (75 mcg total) by mouth as directed. Take by mouth 2 days per week (Sat & Sun)   mesalamine (LIALDA) 1.2 g EC tablet Take 2 tablets (2.4 g total) by mouth daily with breakfast.   Spacer/Aero-Holding Chambers (E-Z SPACER) inhaler Use as instructed   mesalamine (CANASA) 1000 MG suppository Place 1 suppository (1,000 mg total) rectally at bedtime for 14 days.   No facility-administered encounter medications on file as of 01/22/2020.    Allergies (verified) BBolivianut (berthollefia excelsa) skin test, Fluconazole, Metronidazole, Pepcid [famotidine], Shellfish allergy, Macrobid [nitrofurantoin], and Pantoprazole   History: Past Medical History:  Diagnosis Date   ABDOMINAL PAIN, CHRONIC 05/28/2008   ALLERGIC RHINITIS 09/21/2006   ANEMIA-NOS 09/21/2006   ASTHMA 09/21/2006   ASYMPTOMATIC POSTMENOPAUSAL STATUS 05/28/2008   Cataract    CHEST PAIN, ATYPICAL 05/28/2008   COPD (chronic obstructive pulmonary disease) ([place]    DDD (degenerative disc disease)    DIVERTICULITIS, HX OF 09/21/2006   GERD 09/21/2006   HYPERCHOLESTEROLEMIA 06/05/2009   HYPERTENSION 09/21/2006   HYPOTHYROIDISM 09/21/2006   Peptic ulcer disease    Purpura ([place]    RENAL INSUFFICIENCY 09/21/2006   Shingles    Small bowel obstruction (HCC)    Somatization disorder 04/25/2007   UNSPECIFIED URINARY CALCULUS 05/28/2008   UTI (urinary tract infection) 07/2019   Past Surgical History:  Procedure Laterality Date   ABDOMINAL HYSTERECTOMY  [place]   BIOPSY  11/18/2019   Procedure: BIOPSY;  Surgeon: Ladene Artist, MD;  Location: [facility];  Service: Endoscopy;;   [place] N/A 11/18/2019   Procedure: FLEXIBLE SIGMOIDOSCOPY;  Surgeon: Ladene Artist, MD;  Location: [facility];  Service: Endoscopy;  Laterality: N/A;    NASAL SINUS SURGERY  1967   Pulmonary thrombosis     TONSILLECTOMY     TUBAL LIGATION     Family History  Problem Relation Age of Onset   Polycystic kidney disease Mother    Pancreatic cancer Mother    Other Father        Schamberg disease   Bladder Cancer Father    Hypertension Father    Marfan syndrome Son    Hemochromatosis Son    Cirrhosis Son    Allergic rhinitis Sister    Other Brother        bone issue as child; multiple fractures but seemed to age out of this   Hemochromatosis Cousin    Arthritis Sister    Colon cancer Neg Hx    Esophageal cancer Neg Hx    Stomach cancer Neg Hx    Social History   Socioeconomic History   Marital status: Divorced    Spouse name: Not on file   Number of children: 3   Years of education: Not on file   Highest education level: Not on file  Occupational History   Occupation: retired  Tobacco Use   Smoking status: Never Smoker   Smokeless tobacco: Never Used  Scientific laboratory technician Use: Never used  Substance and Sexual Activity   Alcohol use: No   Drug use: No   Sexual activity: Not Currently  Other Topics Concern   Not on file  Social History Narrative   Not on file   Social Determinants of Health   Financial Resource Strain: Low Risk    Difficulty of Paying Living Expenses: Not hard at all  Food Insecurity: No Food Insecurity   Worried About Charity fundraiser in the Last Year: Never true   [place] in the Last Year: Never true  Transportation Needs: No Transportation Needs   Lack of Transportation (Medical): No   Lack of Transportation (Non-Medical): No  Physical Activity: Inactive   Days of Exercise per Week: 0 days   Minutes of Exercise per Session: 0 min  Stress: No Stress Concern Present   Feeling of Stress : Only a little  Social Connections: Moderately Isolated   Frequency of Communication with Friends and Family: More than three times a week   Frequency of  Social Gatherings with Friends and Family: Three times a week   Attends Religious Services: More than 4 times per year   Active Member of Clubs or Organizations: No   Attends Archivist Meetings: Never   Marital Status: Widowed    Tobacco Counseling Counseling given: Not Answered   Clinical Intake:  Pre-visit preparation completed: Yes  Pain : 0-10 Pain Score: 5  Pain Type: Chronic pain,Neuropathic pain Pain Location: Leg (feet, abdominal) Pain Descriptors / Indicators: Aching,Burning Pain Onset: More than a month ago Pain Frequency: Intermittent Pain Relieving Factors: Tylenol  Pain Relieving Factors: Tylenol  Nutritional Risks: None Diabetes: No  How often do you need  to have someone help you when you read instructions, pamphlets, or other written materials from your doctor or pharmacy?: 1 - Never What is the last grade level you completed in school?: 2 years of college  Diabetic?No   Interpreter Needed?: No  Information entered by :: [place] of Daily Living In your present state of health, do you have any difficulty performing the following activities: 01/22/2020 11/17/2019  Hearing? N N  Vision? N N  Difficulty concentrating or making decisions? N N  Walking or climbing stairs? Y Y  Dressing or bathing? N N  Doing errands, shopping? [place] Don[place]  Preparing Food and eating ? N -  Using the Toilet? N -  In the past six months, have you accidently leaked urine? N -  Do you have problems with loss of bowel control? N -  Managing your Medications? N -  Managing your Finances? N -  Housekeeping or managing your Housekeeping? N -  Some recent data might be hidden    Patient Care Team: Caren Macadam, MD as PCP - General (Family Medicine) Jari Pigg, MD as Attending Physician (Dermatology) Clent Jacks, MD (Ophthalmology)  Indicate any recent Medical Services you may have received from other than [facility] providers in the past year  (date may be approximate).     Assessment:   This is a routine wellness examination for Kristee.  Hearing/Vision screen  Hearing Screening   125Hz  250Hz  500Hz  1000Hz  2000Hz  3000Hz  4000Hz  6000Hz  8000Hz   Right ear:           Left ear:           Vision Screening Comments: Patient states has not had eye exam since pandemic    Dietary issues and exercise activities discussed: Current Exercise Habits: The patient does not participate in regular exercise at present, Exercise limited by: cardiac condition(s);respiratory conditions(s);orthopedic condition(s)  Goals     Exercise 3x per week (30 min per time)      Depression Screen PHQ 2/9 Scores 01/22/2020 01/19/2017 09/14/2015 01/12/2015 11/03/2014  PHQ - 2 Score 0 0 0 0 0  PHQ- 9 Score 0 - - - -    Fall Risk Fall Risk  01/22/2020 04/30/2019 01/19/2017 09/14/2015 11/03/2014  Falls in the past year? 1 1 No Yes Yes  Comment - 01/27/2019 - - -  Number falls in past yr: 1 0 - - 1  Injury with Fall? 1 1 - - -  Risk for fall due to : History of fall(s);Impaired balance/gait;Impaired mobility - - - -  Follow up Falls evaluation completed;Falls prevention discussed - - - -    FALL RISK PREVENTION PERTAINING TO THE HOME:  Any stairs in or around the home? No  If so, are there any without handrails? No  Home free of loose throw rugs in walkways, pet beds, electrical cords, etc? Yes  Adequate lighting in your home to reduce risk of falls? Yes   ASSISTIVE DEVICES UTILIZED TO PREVENT FALLS:  Life alert? Yes  Use of a cane, walker or w/c? Yes  Grab bars in the bathroom? Yes  Shower chair or bench in shower? Yes  Elevated toilet seat or a handicapped toilet? Yes   TIMED UP AND GO:  Was the test performed? Yes .  Length of time to ambulate 10 feet: 8 sec.   Gait slow and steady with assistive device  Cognitive Function:        Immunizations Immunization History  Administered Date(s) Administered  Fluad Quad(high Dose 65+) 11/23/2018    Influenza Whole 12/08/2008   Influenza, High Dose Seasonal PF 10/22/2013, 11/12/2016   Influenza,inj,Quad PF,6+ Mos 11/08/2017   Influenza-Unspecified 11/23/2018   Pneumococcal Polysaccharide-23 05/09/2002, 07/18/2014   Td 05/09/2003   Tdap 08/15/2016   Zoster 06/21/2013    TDAP status: Up to date  Flu Vaccine status: Due, Education has been provided regarding the importance of this vaccine. Advised may receive this vaccine at local pharmacy or Health Dept. Aware to provide a copy of the vaccination record if obtained from local pharmacy or Health Dept. Verbalized acceptance and understanding.  Pneumococcal vaccine status: Due, Education has been provided regarding the importance of this vaccine. Advised may receive this vaccine at local pharmacy or Health Dept. Aware to provide a copy of the vaccination record if obtained from local pharmacy or Health Dept. Verbalized acceptance and understanding.  Covid-19 vaccine status: Information provided on how to obtain vaccines.   Qualifies for Shingles Vaccine? Yes   Zostavax completed Yes   Shingrix Completed?: No.    Education has been provided regarding the importance of this vaccine. Patient has been advised to call insurance company to determine out of pocket expense if they have not yet received this vaccine. Advised may also receive vaccine at local pharmacy or Health Dept. Verbalized acceptance and understanding.  Screening Tests Health Maintenance  Topic Date Due   COVID-19 Vaccine (1) Never done   PNA vac Low Risk Adult (2 of 2 - PCV13) 07/18/2015   INFLUENZA VACCINE  09/08/2019   TETANUS/TDAP  08/16/2026   DEXA SCAN  Completed    Health Maintenance  Health Maintenance Due  Topic Date Due   COVID-19 Vaccine (1) Never done   PNA vac Low Risk Adult (2 of 2 - PCV13) 07/18/2015   INFLUENZA VACCINE  09/08/2019    Colorectal cancer screening: No longer required.   Mammogram status: No longer required due to  age.  Bone Density status: Ordered 01/22/2020. Pt provided with contact info and advised to call to schedule appt.  Lung Cancer Screening: (Low Dose CT Chest recommended if Age 31-80 years, 30 pack-year currently smoking OR have quit w/in 15years.) does not qualify.   Lung Cancer Screening Referral: N/A  Additional Screening:  Hepatitis C Screening: does not qualify;  Vision Screening: Recommended annual ophthalmology exams for early detection of glaucoma and other disorders of the eye. Is the patient up to date with their annual eye exam?  No  Who is the provider or what is the name of the office in which the patient attends annual eye exams? Dr.Groat  If pt is not established with a provider, would they like to be referred to a provider to establish care? No .   Dental Screening: Recommended annual dental exams for proper oral hygiene  Community Resource Referral / Chronic Care Management: CRR required this visit?  No   CCM required this visit?  No      Plan:     I have personally reviewed and noted the following in the patients chart:    Medical and social history  Use of alcohol, tobacco or illicit drugs   Current medications and supplements  Functional ability and status  Nutritional status  Physical activity  Advanced directives  List of other physicians  Hospitalizations, surgeries, and ER visits in previous 12 months  Vitals  Screenings to include cognitive, depression, and falls  Referrals and appointments  In addition, I have reviewed and discussed with patient certain  preventive protocols, quality metrics, and best practice recommendations. A written personalized care plan for preventive services as well as general preventive health recommendations were provided to patient.     Ofilia Neas, LPN   [id number]   Nurse Notes: None

## 2020-01-22 NOTE — Patient Instructions (Signed)
[name] , Thank you for taking time to come for your Medicare Wellness Visit. I appreciate your ongoing commitment to your health goals. Please review the following plan we discussed and let me know if I can assist you in the future.   Screening recommendations/referrals: Colonoscopy: No longer required Mammogram: No longer required  Bone Density: Currently due, orders placed this visit  Recommended yearly ophthalmology/optometry visit for glaucoma screening and checkup Recommended yearly dental visit for hygiene and checkup  Vaccinations: Influenza vaccine: Currently due, if you wish to receive you may do so at our office or your local pharmacy  Pneumococcal vaccine: Currently due for Prevnar 13 you may receive this at your next in person office visit  Tdap vaccine: Up to date, next due 08/16/2026 Shingles vaccine: Currently due for Shingrix, if you wish to receive we recommend that you do so at your local pharmacy as it is less expensive     Advanced directives: Please bring copies of your advanced medical directives   Conditions/risks identified: None   Next appointment: 01/22/2021 @ 11:15 am with [place] 85 Years and Older, Female Preventive care refers to lifestyle choices and visits with your health care provider that can promote health and wellness. What does preventive care include?  A yearly physical exam. This is also called an annual well check.  Dental exams once or twice a year.  Routine eye exams. Ask your health care provider how often you should have your eyes checked.  Personal lifestyle choices, including:  Daily care of your teeth and gums.  Regular physical activity.  Eating a healthy diet.  Avoiding tobacco and drug use.  Limiting alcohol use.  Practicing safe sex.  Taking low-dose aspirin every day.  Taking vitamin and mineral supplements as recommended by your health care provider. What happens during  an annual well check? The services and screenings done by your health care provider during your annual well check will depend on your age, overall health, lifestyle risk factors, and family history of disease. Counseling  Your health care provider may ask you questions about your:  Alcohol use.  Tobacco use.  Drug use.  Emotional well-being.  Home and relationship well-being.  Sexual activity.  Eating habits.  History of falls.  Memory and ability to understand (cognition).  Work and work Statistician.  Reproductive health. Screening  You may have the following tests or measurements:  Height, weight, and BMI.  Blood pressure.  Lipid and cholesterol levels. These may be checked every 5 years, or more frequently if you are over 83 years old.  Skin check.  Lung cancer screening. You may have this screening every year starting at age 83 if you have a 30-pack-year history of smoking and currently smoke or have quit within the past 15 years.  Fecal occult blood test (FOBT) of the stool. You may have this test every year starting at age 83.  Flexible sigmoidoscopy or colonoscopy. You may have a sigmoidoscopy every 5 years or a colonoscopy every 10 years starting at age 83.  Hepatitis C blood test.  Hepatitis B blood test.  Sexually transmitted disease (STD) testing.  Diabetes screening. This is done by checking your blood sugar (glucose) after you have not eaten for a while (fasting). You may have this done every 1-3 years.  Bone density scan. This is done to screen for osteoporosis. You may have this done starting at age 83.  Mammogram. This may be done  every 1-2 years. Talk to your health care provider about how often you should have regular mammograms. Talk with your health care provider about your test results, treatment options, and if necessary, the need for more tests. Vaccines  Your health care provider may recommend certain vaccines, such as:  Influenza  vaccine. This is recommended every year.  Tetanus, diphtheria, and acellular pertussis (Tdap, Td) vaccine. You may need a Td booster every 10 years.  Zoster vaccine. You may need this after age 83.  Pneumococcal 13-valent conjugate (PCV13) vaccine. One dose is recommended after age 83.  Pneumococcal polysaccharide (PPSV23) vaccine. One dose is recommended after age 83. Talk to your health care provider about which screenings and vaccines you need and how often you need them. This information is not intended to replace advice given to you by your health care provider. Make sure you discuss any questions you have with your health care provider. Document Released: 02/20/2015 Document Revised: 10/14/2015 Document Reviewed: 11/25/2014 Elsevier Interactive Patient Education  2017 [place] Prevention in the Home Falls can cause injuries. They can happen to people of all ages. There are many things you can do to make your home safe and to help prevent falls. What can I do on the outside of my home?  Regularly fix the edges of walkways and driveways and fix any cracks.  Remove anything that might make you trip as you walk through a door, such as a raised step or threshold.  Trim any bushes or trees on the path to your home.  Use bright outdoor lighting.  Clear any walking paths of anything that might make someone trip, such as rocks or tools.  Regularly check to see if handrails are loose or broken. Make sure that both sides of any steps have handrails.  Any raised decks and porches should have guardrails on the edges.  Have any leaves, snow, or ice cleared regularly.  Use sand or salt on walking paths during winter.  Clean up any spills in your garage right away. This includes oil or grease spills. What can I do in the bathroom?  Use night lights.  Install grab bars by the toilet and in the tub and shower. Do not use towel bars as grab bars.  Use non-skid mats or decals  in the tub or shower.  If you need to sit down in the shower, use a plastic, non-slip stool.  Keep the floor dry. Clean up any water that spills on the floor as soon as it happens.  Remove soap buildup in the tub or shower regularly.  Attach bath mats securely with double-sided non-slip rug tape.  Do not have throw rugs and other things on the floor that can make you trip. What can I do in the bedroom?  Use night lights.  Make sure that you have a light by your bed that is easy to reach.  Do not use any sheets or blankets that are too big for your bed. They should not hang down onto the floor.  Have a firm chair that has side arms. You can use this for support while you get dressed.  Do not have throw rugs and other things on the floor that can make you trip. What can I do in the kitchen?  Clean up any spills right away.  Avoid walking on wet floors.  Keep items that you use a lot in easy-to-reach places.  If you need to reach something above you, use a  strong step stool that has a grab bar.  Keep electrical cords out of the way.  Do not use floor polish or wax that makes floors slippery. If you must use wax, use non-skid floor wax.  Do not have throw rugs and other things on the floor that can make you trip. What can I do with my stairs?  Do not leave any items on the stairs.  Make sure that there are handrails on both sides of the stairs and use them. Fix handrails that are broken or loose. Make sure that handrails are as long as the stairways.  Check any carpeting to make sure that it is firmly attached to the stairs. Fix any carpet that is loose or worn.  Avoid having throw rugs at the top or bottom of the stairs. If you do have throw rugs, attach them to the floor with carpet tape.  Make sure that you have a light switch at the top of the stairs and the bottom of the stairs. If you do not have them, ask someone to add them for you. What else can I do to help  prevent falls?  Wear shoes that:  Do not have high heels.  Have rubber bottoms.  Are comfortable and fit you well.  Are closed at the toe. Do not wear sandals.  If you use a stepladder:  Make sure that it is fully opened. Do not climb a closed stepladder.  Make sure that both sides of the stepladder are locked into place.  Ask someone to hold it for you, if possible.  Clearly mark and make sure that you can see:  Any grab bars or handrails.  First and last steps.  Where the edge of each step is.  Use tools that help you move around (mobility aids) if they are needed. These include:  Canes.  Walkers.  Scooters.  Crutches.  Turn on the lights when you go into a dark area. Replace any light bulbs as soon as they burn out.  Set up your furniture so you have a clear path. Avoid moving your furniture around.  If any of your floors are uneven, fix them.  If there are any pets around you, be aware of where they are.  Review your medicines with your doctor. Some medicines can make you feel dizzy. This can increase your chance of falling. Ask your doctor what other things that you can do to help prevent falls. This information is not intended to replace advice given to you by your health care provider. Make sure you discuss any questions you have with your health care provider. Document Released: 11/20/2008 Document Revised: 07/02/2015 Document Reviewed: 02/28/2014 Elsevier Interactive Patient Education  2017 [employer].

## 2020-01-23 NOTE — Telephone Encounter (Signed)
Rx done. 

## 2020-01-23 NOTE — Telephone Encounter (Signed)
Refill for one year 

## 2020-01-29 ENCOUNTER — Telehealth: Payer: Self-pay | Admitting: Nurse Practitioner

## 2020-01-29 NOTE — Telephone Encounter (Signed)
Pt is requesting a call back from a nurse to discuss possibly being prescribed amoxicillin due to her rectal bleeding which has started the past two days.

## 2020-01-29 NOTE — Telephone Encounter (Signed)
Line busy

## 2020-01-30 ENCOUNTER — Other Ambulatory Visit: Payer: Self-pay

## 2020-01-30 MED ORDER — PREDNISONE 5 MG PO TABS: ORAL_TABLET | ORAL | 0 refills | Status: DC

## 2020-01-30 NOTE — Telephone Encounter (Signed)
Patient is returning your call.  

## 2020-01-30 NOTE — Telephone Encounter (Signed)
Spoke with the patient. She has had bleeding with her bowel movements for 3 days. She complains of lower abdominal tenderness and discomfort. She went on clear liquids today. She does this to decrease the volume of stool and bleeding.  Patient says amoxicillin always helps.

## 2020-01-30 NOTE — Telephone Encounter (Signed)
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  Please start prednisone 20 mg daily for a week followed by taper dose.  Plan to decrease by 5 mg every week until she completes the course.  Schedule follow-up office visit soon within 2 to 4 weeks. She will need to come to the ER if her symptoms are worse.

## 2020-01-30 NOTE — Telephone Encounter (Signed)
Patient notified. She expresses concern but agrees to try. She will let us know if she worsens. Declines to schedule the follow up right now.

## 2020-02-08 DIAGNOSIS — U071 COVID-19: Secondary | ICD-10-CM

## 2020-02-08 HISTORY — DX: COVID-19: U07.1

## 2020-02-11 ENCOUNTER — Telehealth: Payer: Self-pay | Admitting: Hematology

## 2020-02-11 NOTE — Telephone Encounter (Signed)
Pt cld to cancel her appt w/Dr. Irene Limbo on 1/5 due to illness. She has decided not to reschedule at this time.

## 2020-02-12 ENCOUNTER — Inpatient Hospital Stay: Payer: Medicare Other | Admitting: Hematology

## 2020-02-27 ENCOUNTER — Other Ambulatory Visit: Payer: Self-pay | Admitting: Nurse Practitioner

## 2020-03-01 ENCOUNTER — Other Ambulatory Visit: Payer: Self-pay | Admitting: Gastroenterology

## 2020-03-03 ENCOUNTER — Telehealth: Payer: Self-pay | Admitting: Nurse Practitioner

## 2020-03-04 ENCOUNTER — Other Ambulatory Visit: Payer: Self-pay

## 2020-03-04 ENCOUNTER — Other Ambulatory Visit: Payer: Self-pay | Admitting: Gastroenterology

## 2020-03-04 ENCOUNTER — Other Ambulatory Visit: Payer: Self-pay | Admitting: Family Medicine

## 2020-03-04 ENCOUNTER — Telehealth (INDEPENDENT_AMBULATORY_CARE_PROVIDER_SITE_OTHER): Payer: Medicare Other | Admitting: Family Medicine

## 2020-03-04 ENCOUNTER — Emergency Department (HOSPITAL_COMMUNITY)
Admission: EM | Admit: 2020-03-04 | Discharge: 2020-03-04 | Disposition: A | Discharge status: Home / Self Care | Payer: Medicare Other | Attending: Emergency Medicine | Admitting: Emergency Medicine

## 2020-03-04 ENCOUNTER — Encounter: Payer: Self-pay | Admitting: Family Medicine

## 2020-03-04 ENCOUNTER — Telehealth: Payer: Self-pay | Admitting: Family Medicine

## 2020-03-04 ENCOUNTER — Emergency Department (HOSPITAL_COMMUNITY): Payer: Medicare Other

## 2020-03-04 VITALS — Temp 99.3°F | Ht 65.0 in

## 2020-03-04 DIAGNOSIS — R5381 Other malaise: Secondary | ICD-10-CM | POA: Diagnosis not present

## 2020-03-04 DIAGNOSIS — R109 Unspecified abdominal pain: Secondary | ICD-10-CM | POA: Diagnosis not present

## 2020-03-04 DIAGNOSIS — D649 Anemia, unspecified: Secondary | ICD-10-CM

## 2020-03-04 DIAGNOSIS — Z743 Need for continuous supervision: Secondary | ICD-10-CM | POA: Diagnosis not present

## 2020-03-04 DIAGNOSIS — I10 Essential (primary) hypertension: Secondary | ICD-10-CM | POA: Diagnosis not present

## 2020-03-04 DIAGNOSIS — Z79899 Other long term (current) drug therapy: Secondary | ICD-10-CM | POA: Insufficient documentation

## 2020-03-04 DIAGNOSIS — U071 COVID-19: Secondary | ICD-10-CM

## 2020-03-04 DIAGNOSIS — R0602 Shortness of breath: Secondary | ICD-10-CM | POA: Diagnosis not present

## 2020-03-04 DIAGNOSIS — K51811 Other ulcerative colitis with rectal bleeding: Secondary | ICD-10-CM | POA: Diagnosis not present

## 2020-03-04 DIAGNOSIS — R279 Unspecified lack of coordination: Secondary | ICD-10-CM | POA: Diagnosis not present

## 2020-03-04 DIAGNOSIS — K449 Diaphragmatic hernia without obstruction or gangrene: Secondary | ICD-10-CM | POA: Diagnosis not present

## 2020-03-04 DIAGNOSIS — J449 Chronic obstructive pulmonary disease, unspecified: Secondary | ICD-10-CM | POA: Insufficient documentation

## 2020-03-04 DIAGNOSIS — R531 Weakness: Secondary | ICD-10-CM | POA: Diagnosis not present

## 2020-03-04 DIAGNOSIS — R7889 Finding of other specified substances, not normally found in blood: Secondary | ICD-10-CM | POA: Diagnosis not present

## 2020-03-04 DIAGNOSIS — E039 Hypothyroidism, unspecified: Secondary | ICD-10-CM | POA: Insufficient documentation

## 2020-03-04 LAB — CBC WITH DIFFERENTIAL/PLATELET
Abs Immature Granulocytes: 0.04 10*3/uL (ref 0.00–0.07)
Basophils Absolute: 0 10*3/uL (ref 0.0–0.1)
Basophils Relative: 0 %
Eosinophils Absolute: 0 10*3/uL (ref 0.0–0.5)
Eosinophils Relative: 0 %
HCT: 26.8 % — ABNORMAL LOW (ref 36.0–46.0)
Hemoglobin: 8.2 g/dL — ABNORMAL LOW (ref 12.0–15.0)
Immature Granulocytes: 1 %
Lymphocytes Relative: 26 %
Lymphs Abs: 1.4 10*3/uL (ref 0.7–4.0)
MCH: 30 pg (ref 26.0–34.0)
MCHC: 30.6 g/dL (ref 30.0–36.0)
MCV: 98.2 fL (ref 80.0–100.0)
Monocytes Absolute: 0.8 10*3/uL (ref 0.1–1.0)
Monocytes Relative: 14 %
Neutro Abs: 3.2 10*3/uL (ref 1.7–7.7)
Neutrophils Relative %: 59 %
Platelets: 333 10*3/uL (ref 150–400)
RBC: 2.73 MIL/uL — ABNORMAL LOW (ref 3.87–5.11)
RDW: 15 % (ref 11.5–15.5)
WBC: 5.4 10*3/uL (ref 4.0–10.5)
nRBC: 0 % (ref 0.0–0.2)

## 2020-03-04 LAB — COMPREHENSIVE METABOLIC PANEL
ALT: 21 U/L (ref 0–44)
AST: 28 U/L (ref 15–41)
Albumin: 3 g/dL — ABNORMAL LOW (ref 3.5–5.0)
Alkaline Phosphatase: 40 U/L (ref 38–126)
Anion gap: 10 (ref 5–15)
BUN: 16 mg/dL (ref 8–23)
CO2: 22 mmol/L (ref 22–32)
Calcium: 8.5 mg/dL — ABNORMAL LOW (ref 8.9–10.3)
Chloride: 101 mmol/L (ref 98–111)
Creatinine, Ser: 0.64 mg/dL (ref 0.44–1.00)
GFR, Estimated: >60 mL/min (ref >60)
Glucose, Bld: 126 mg/dL — ABNORMAL HIGH (ref 70–99)
Potassium: 3.8 mmol/L (ref 3.5–5.1)
Sodium: 133 mmol/L — ABNORMAL LOW (ref 135–145)
Total Bilirubin: 0.7 mg/dL (ref 0.3–1.2)
Total Protein: 5.8 g/dL — ABNORMAL LOW (ref 6.5–8.1)

## 2020-03-04 LAB — POC SARS CORONAVIRUS 2 AG -  ED: SARS Coronavirus 2 Ag: POSITIVE — AB

## 2020-03-04 MED ORDER — PREDNISONE 20 MG PO TABS: 40.0000 mg | ORAL_TABLET | Freq: Every day | ORAL | 0 refills | Status: DC

## 2020-03-04 MED ORDER — ACETAMINOPHEN 500 MG PO TABS
1000.0000 mg | ORAL_TABLET | Freq: Once | ORAL | Status: AC
  Administered 2020-03-04: 1000 mg via ORAL
  Filled 2020-03-04: qty 2

## 2020-03-04 NOTE — ED Notes (Signed)
[facility] called @ 1428

## 2020-03-04 NOTE — ED Triage Notes (Signed)
Patient BIB [facility] for generalized weakness and shortness of breath. Patient had a PCP appointment today for symptoms, son completed a rapid COVID test this morning and it was positive. When PCP was notified patient states she was told to go to emergency department. Did not receive COVID vaccine, SpO2 on room air 100%.

## 2020-03-04 NOTE — ED Notes (Signed)
Pt's POC Sars Coronavirus 2 Ag result was POSITIVE.

## 2020-03-04 NOTE — Telephone Encounter (Signed)
Noted  

## 2020-03-04 NOTE — ED Notes (Signed)
Spoke with pt son and provided d/c instructions and follow up care. Son would like [facility] to take pt home d/t pt unable to walk with steady gait from parking lot to front door. Son asked to be called when PT[facility] arrives and will meet them at pt house.

## 2020-03-04 NOTE — Telephone Encounter (Signed)
Pt son call and want dr Ethlyn Gallery to know that the pt tested positive for covid and want to know what  he need to do for her.

## 2020-03-04 NOTE — Discharge Instructions (Signed)
Today your blood counts were lower than normal.  They were 8.2 from your baseline of 10.  This is most likely adding into why you feel so weak.  Also with having Covid that will make you very tired with headache and body aches.  Make sure you are trying to follow your diet and staying hydrated.  If you start noticing more rectal bleeding, worsening abdominal pain, any shortness of breath, chest pain or feeling like you are going to pass out please return to the emergency room.

## 2020-03-04 NOTE — ED Notes (Signed)
Spoke to pt son and provided update on pt care. Son states she is having a hard time walking due to being so weak. Lack of strength is a concern for family getting her in the car and what to do at home, since the pt lives by herself.

## 2020-03-04 NOTE — ED Provider Notes (Signed)
[facility] EMERGENCY DEPARTMENT Provider Note   CSN: [id number] Arrival date & time: 03/04/20  0935     History Chief Complaint  Patient presents with  . Shortness of Breath    TELINA KLECKLEY is a 86 y.o. female.  Patient is an 86 year old female with a history of COPD, hypertension, PVD, porphyria and ulcerative rectosigmoiditis with with recurrent GI bleeds who is presenting today with complaints of weakness.  Patient reports for the last approximately 1 week she has not felt quite right.  She has had general malaise which has worsened in the last 4 days and now she just feels very fatigued.  She reports that she feels so weak that she is concerned she may fall.  She has been eating but she has not had the energy to make nutritious meals so she is just been snacking and drinking boost.  She has occasionally felt chills but not had any significant fever.  No cough, shortness of breath or chest pain.  The last time she noted any bright red blood per rectum was for 5 days ago.  She denies any urinary complaints.  She has not received the Covid vaccine and was tested yesterday and came back positive for Covid.  The history is provided by the patient and medical records.  Shortness of Breath      Past Medical History:  Diagnosis Date  . ABDOMINAL PAIN, CHRONIC 05/28/2008  . ALLERGIC RHINITIS 09/21/2006  . ANEMIA-NOS 09/21/2006  . ASTHMA 09/21/2006  . ASYMPTOMATIC POSTMENOPAUSAL STATUS 05/28/2008  . Cataract   . CHEST PAIN, ATYPICAL 05/28/2008  . COPD (chronic obstructive pulmonary disease) ([place])   . DDD (degenerative disc disease)   . DIVERTICULITIS, HX OF 09/21/2006  . GERD 09/21/2006  . HYPERCHOLESTEROLEMIA 06/05/2009  . HYPERTENSION 09/21/2006  . HYPOTHYROIDISM 09/21/2006  . Peptic ulcer disease   . Purpura ([place])   . RENAL INSUFFICIENCY 09/21/2006  . Shingles   . Small bowel obstruction ([place])   . Somatization disorder 04/25/2007  . UNSPECIFIED URINARY CALCULUS  05/28/2008  . UTI (urinary tract infection) 07/2019    Patient Active Problem List   Diagnosis Date Noted  . Chronic ulcerative rectosigmoiditis with rectal bleeding ([place])   . Rectal bleeding   . Abnormal CT scan, gastrointestinal tract   . Lower GI bleed 11/16/2019  . LLQ abdominal pain 10/31/2019  . Diverticulitis 10/31/2019  . UTI (urinary tract infection) 08/06/2019  . COPD (chronic obstructive pulmonary disease) ([place])   . Colitis   . Vitamin D deficiency 05/01/2019  . Arthralgia 08/21/2017  . Osteoporosis 11/21/2016  . Postmenopausal status 09/06/2016  . Other nonspecific abnormal finding of lung field 08/14/2015  . Hyponatremia 07/21/2014  . Menopausal state 07/18/2014  . Hematest positive stools 08/05/2013  . Anemia, blood loss 08/05/2013  . Cerumen impaction 06/03/2013  . History of adrenal disorder 10/15/2012  . Rash and nonspecific skin eruption 10/15/2012  . Iron deficiency anemia 06/20/2012  . Psychogenic polydipsia 06/20/2012  . Nonspecific abnormal electrocardiogram (ECG) (EKG) 06/20/2012  . Polyuria 05/02/2012  . Pelvic pain in female 08/16/2011  . Leg ulcer ([place]) 06/16/2010  . Routine general medical examination at a health care facility 06/11/2010  . Encounter for long-term (current) use of other medications 06/11/2010  . HYPERCHOLESTEROLEMIA 06/05/2009  . UNSPECIFIED URINARY CALCULUS 05/28/2008  . CHEST PAIN, ATYPICAL 05/28/2008  . ABDOMINAL PAIN, CHRONIC 05/28/2008  . ASYMPTOMATIC POSTMENOPAUSAL STATUS 05/28/2008  . SOMATIZATION DISORDER 04/25/2007  . Hypothyroidism 09/21/2006  . Essential hypertension  09/21/2006  . ALLERGIC RHINITIS 09/21/2006  . ASTHMA 09/21/2006  . GERD 09/21/2006  . Disorder resulting from impaired renal function 09/21/2006  . DIVERTICULITIS, HX OF 09/21/2006    Past Surgical History:  Procedure Laterality Date  . ABDOMINAL HYSTERECTOMY  1981  . acute Nephritis  1958  . APPENDECTOMY  1959  . BIOPSY  11/18/2019   Procedure:  BIOPSY;  Surgeon: Ladene Artist, MD;  Location: [facility];  Service: Endoscopy;;  . CESAREAN SECTION    . CHOLECYSTECTOMY  1983  . COLON SURGERY    . DENTAL SURGERY  1970  . FLEXIBLE SIGMOIDOSCOPY N/A 11/18/2019   Procedure: FLEXIBLE SIGMOIDOSCOPY;  Surgeon: Ladene Artist, MD;  Location: [facility];  Service: Endoscopy;  Laterality: N/A;  . NASAL SINUS SURGERY  1967  . Pulmonary thrombosis    . TONSILLECTOMY    . TUBAL LIGATION       OB History   No obstetric history on file.     Family History  Problem Relation Age of Onset  . Polycystic kidney disease Mother   . Pancreatic cancer Mother   . Other Father        Schamberg disease  . Bladder Cancer Father   . Hypertension Father   . Marfan syndrome Son   . Hemochromatosis Son   . Cirrhosis Son   . Allergic rhinitis Sister   . Other Brother        bone issue as child; multiple fractures but seemed to age out of this  . Hemochromatosis Cousin   . Arthritis Sister   . Colon cancer Neg Hx   . Esophageal cancer Neg Hx   . Stomach cancer Neg Hx     Social History   Tobacco Use  . Smoking status: Never Smoker  . Smokeless tobacco: Never Used  Vaping Use  . Vaping Use: Never used  Substance Use Topics  . Alcohol use: No  . Drug use: No    Home Medications Prior to Admission medications   Medication Sig Start Date End Date Taking? Authorizing Provider  acetaminophen (TYLENOL) 325 MG tablet Take 650 mg by mouth every 4 (four) hours as needed for mild pain or headache.     [provider]  Cholecalciferol (VITAMIN D3 PO) Take 1 tablet by mouth 2 (two) times a week. Saturday and Sunday    [provider]  Ferrous Sulfate (IRON) 325 (65 FE) MG TABS Take 1 tablet by mouth daily.     [provider]  fluticasone (FLOVENT HFA) 110 MCG/ACT inhaler INHALE 1 PUFF BY MOUTH TWICE A DAY 12/30/19   Caren Macadam, MD  levothyroxine (SYNTHROID) 50 MCG tablet Take 1 tablet (50 mcg total) by  mouth every Monday, Tuesday, Wednesday, Thursday, and Friday. 11/20/19   Hongalgi, Lenis Dickinson, MD  levothyroxine (SYNTHROID) 75 MCG tablet TAKE 1 TABLET BY MOUTH 2 DAYS PER WEEK 01/23/20   Burchette, Alinda Sierras, MD  mesalamine (CANASA) 1000 MG suppository Place 1 suppository (1,000 mg total) rectally at bedtime for 14 days. 01/07/20 01/21/20  Willia Craze, NP  mesalamine (LIALDA) 1.2 g EC tablet TAKE 2 TABLETS BY MOUTH DAILY WITH BREAKFAST. 02/27/20   Willia Craze, NP  predniSONE (DELTASONE) 5 MG tablet 20 mg x 7 days then decrease by 5 mg every 7 days until gone 01/30/20   Mauri Pole, MD  Spacer/Aero-Holding Chambers (E-Z SPACER) inhaler Use as instructed 11/08/17   Caren Macadam, MD    Allergies  [place] nut (berthollefia excelsa) skin test, Fluconazole, Metronidazole, Pepcid [famotidine], Shellfish allergy, Macrobid [nitrofurantoin], and Pantoprazole  Review of Systems   Review of Systems  Respiratory: Positive for shortness of breath.   All other systems reviewed and are negative.   Physical Exam Updated Vital Signs BP (!) 118/52 (BP Location: Right Arm)   Pulse 81   Temp 98.5 F (36.9 C) (Oral)   Resp 14   SpO2 99%   Physical Exam Vitals and nursing note reviewed.  Constitutional:      General: She is not in acute distress.    Appearance: She is well-developed, normal weight and well-nourished.  HENT:     Head: Normocephalic and atraumatic.  Eyes:     Extraocular Movements: EOM normal.     Pupils: Pupils are equal, round, and reactive to light.  Cardiovascular:     Rate and Rhythm: Normal rate and regular rhythm.     Pulses: Intact distal pulses.     Heart sounds: Normal heart sounds. No murmur heard. No friction rub.  Pulmonary:     Effort: Pulmonary effort is normal. No tachypnea.     Breath sounds: Normal breath sounds. No wheezing or rales.  Abdominal:     General: Bowel sounds are normal. There is no distension.     Palpations: Abdomen is  soft.     Tenderness: There is no abdominal tenderness. There is no guarding or rebound.  Musculoskeletal:        General: No tenderness. Normal range of motion.     Cervical back: Normal range of motion.     Right lower leg: No edema.     Left lower leg: No edema.     Comments: No edema  Skin:    General: Skin is warm and dry.     Capillary Refill: Capillary refill takes less than 2 seconds.     Findings: No rash.  Neurological:     General: No focal deficit present.     Mental Status: She is alert and oriented to person, place, and time. Mental status is at baseline.     Cranial Nerves: No cranial nerve deficit.  Psychiatric:        Mood and Affect: Mood and affect and mood normal.        Behavior: Behavior normal.        Thought Content: Thought content normal.      ED Results / Procedures / Treatments   Labs (all labs ordered are listed, but only abnormal results are displayed) Labs Reviewed  CBC WITH DIFFERENTIAL/PLATELET - Abnormal; Notable for the following components:      Result Value   RBC 2.73 (*)    Hemoglobin 8.2 (*)    HCT 26.8 (*)    All other components within normal limits  COMPREHENSIVE METABOLIC PANEL - Abnormal; Notable for the following components:   Sodium 133 (*)    Glucose, Bld 126 (*)    Calcium 8.5 (*)    Total Protein 5.8 (*)    Albumin 3.0 (*)    All other components within normal limits  POC SARS CORONAVIRUS 2 AG -  ED - Abnormal; Notable for the following components:   SARS Coronavirus 2 Ag POSITIVE (*)    All other components within normal limits    EKG None  Radiology DG Chest Portable 1 View  Result Date: 03/04/2020 CLINICAL DATA:  Shortness of breath EXAM: PORTABLE CHEST 1 VIEW COMPARISON:  12/01/2015 FINDINGS: Cardiac shadows within normal limits.  Large hiatal hernia is seen. Lungs are clear bilaterally. Rotation to the right accentuates the mediastinal markings. No focal infiltrate is seen. No bony abnormality is noted.  IMPRESSION: Large hiatal hernia. No other focal abnormality is noted. Electronically Signed   By: Inez Catalina M.D.   On: 03/04/2020 10:10    Procedures Procedures   Medications Ordered in ED Medications - No data to display  ED Course  I have reviewed the triage vital signs and the nursing notes.  Pertinent labs & imaging results that were available during my care of the patient were reviewed by me and considered in my medical decision making (see chart for details).    MDM Rules/Calculators/A&P                          Patient is an 86 year old female who is presenting with generalized weakness over the last 3 to 4 days.  This is in the setting of not feeling great for the last 1 week.  Patient was found today to be Covid positive.  She denies any respiratory symptoms and is satting 99% on room air.  She has no chest pain or new exertional dyspnea.  Patient's EKG is unchanged from prior and low suspicion for acute cardiac issue.  Suspect that patient's weakness and fatigue are all related to Covid but given her prior history of GI bleeding will check to ensure hemoglobin is stable and renal function has not changed.  She denies any abdominal pain at this time nausea or vomiting or diarrhea.  2:24 PM Patient's hemoglobin has dropped today to 8.2 from 10 in November. She has reported intermittent bleeding in her stool but feels that it is not any worse than she has not had any in the last 3 to 4 days. Renal function and electrolytes are within normal limits. Findings discussed with the patient and offered to do a rectal exam as well as talk with gastroenterology as it seems that the bleeding must be coming from her proctosigmoiditis. She is not having significant abdominal pain at this time and she does not take any anticoagulation. Patient does not wish to have a blood transfusion, stay in the hospital or for me to do a rectal exam at this time. She reports the symptoms are occurring because  she is just not been able to eat well. Discussed with her that there is no specific treatment for Covid at this time as she has now had symptoms for approximately a week has no respiratory symptoms involved and will refer to the infusion clinic but she does not seem terribly interested. We will send a note to her PCP and GI doctor and encourage patient to follow-up as she does need a repeat hemoglobin in 1 week. She was instructed to return if her symptoms worsened or she developed any respiratory causes. We will do a short course of steroids as that has helped her proctosigmoiditis in the past and may help with some of her Covid symptoms.  MDM Number of Diagnoses or Management Options   Amount and/or Complexity of Data Reviewed Clinical lab tests: ordered and reviewed Tests in the radiology section of CPT: ordered and reviewed Tests in the medicine section of CPT: reviewed and ordered Decide to obtain previous medical records or to obtain history from someone other than the patient: yes Obtain history from someone other than the patient: yes Review and summarize past medical records: yes Discuss the patient with other providers:  no Independent visualization of images, tracings, or specimens: yes  Risk of Complications, Morbidity, and/or Mortality Presenting problems: moderate Diagnostic procedures: low Management options: low  Patient Progress Patient progress: stable   Final Clinical Impression(s) / ED Diagnoses Final diagnoses:  COVID  Symptomatic anemia    Rx / DC Orders ED Discharge Orders         Ordered    predniSONE (DELTASONE) 20 MG tablet  Daily        03/04/20 1437           Blanchie Dessert, MD 03/04/20 1437

## 2020-03-04 NOTE — Progress Notes (Addendum)
Virtual Visit via Telephone Note  I connected with RAYLIE MADDISON on 03/04/20 at  7:30 AM EST by telephone and verified that I am speaking with the correct person using two identifiers.   I discussed the limitations, risks, security and privacy concerns of performing an evaluation and management service by telephone and the availability of in person appointments. I also discussed with the patient that there may be a patient responsible charge related to this service. The patient expressed understanding and agreed to proceed.  Location patient: home Location provider: work office Participants present for the call: patient, provider Patient did not have a visit in the prior 7 days to address this/these issue(s).  Chief Complaint  Patient presents with   Covid Positive   History of Present Illness: Ms. Allport is a 86 year old female with history of asthma, COPD, recent GI bleed, and purpura complaining about feeling "very weak." She started with symptoms about 6 days ago, worse for the past 2-3 days. Decreased appetite. Home test COVID 19 positive 2 days ago.  She has not noted fever, chills, nasal congestion, rhinorrhea, postnasal drainage, sore throat, CP, dyspnea, wheezing, or unusual skin rash. Negative for anosmia and ageusia.  Body aches, especially rib cage and back. No sick contact. COVID 19 vaccination not completed.  No associated abdominal pain, nausea, vomiting, changes in bowel habits, or urinary symptoms. She has not had a GI bleed recently.  COPD and asthma: She is currently on Flovent 110 mcg 1 puff bid. She has not needed albuterol inhaler in a while. She took 4 tablets of amoxicillin that she had left from all prescription and felt better, she wonders if she can have more antibiotics.   She has not tried OTC medication.  Observations/Objective: Patient sounds well on the phone. I do not appreciate any SOB,cough,wheezing,or stridor. Speech and thought processing are  grossly intact. Patient reported vitals:Temp 99.3 F (37.4 C)    Ht 5' 5"  (1.651 m)    BMI 23.66 kg/m   Assessment and Plan:  1. COVID-19 virus infection We discussed dx.prognosis, and treatment options. Recommend monoclonal ab infusion, she refused. States that she will not be able to go to the infusion center, she is feeling very weak and does not think she can "make it to the car." Her son lives with her, spoke with him and explain the importance of starting treatment ASAP.  Her son tells me that Ms Reach usually has problems with mobility, worse since she has been sick. He does not think he can help her get in the car to bring her for IV infusion or the ER. He wonders if more abx will help, explained that abx is not a treatment options for COVID 19 infection.  We discussed possible complications of COVID 19 infection,symptoms can progress in hours.   Ms Minckler does not want me to call 911. Strongy recommend ER evaluation, her son finally agrees with calling 911 now.  Follow Up Instructions:  I did not refer this patient for an OV in the next 24 hours for this/these issue(s).  I discussed the assessment and treatment plan with the patient. Ms. Witman and her son provided an opportunity to ask questions and all were answered. They agreed with the plan and demonstrated an understanding of the instructions.   I provided 25 minutes of non-face-to-face time during this encounter.   Charon Smedberg [place], MD

## 2020-03-05 ENCOUNTER — Telehealth: Payer: Self-pay | Admitting: Family Medicine

## 2020-03-05 ENCOUNTER — Encounter: Payer: Self-pay | Admitting: Nurse Practitioner

## 2020-03-05 NOTE — Telephone Encounter (Signed)
Pt call and stated she need a refill on levothyroxine (SYNTHROID) 50 MCG tablet [facility]  [id number] [place]  [phone] Fax:  [phone]

## 2020-03-06 ENCOUNTER — Other Ambulatory Visit: Payer: Self-pay | Admitting: Family Medicine

## 2020-03-06 MED ORDER — LEVOTHYROXINE SODIUM 50 MCG PO TABS: 50.0000 ug | ORAL_TABLET | ORAL | 1 refills | Status: DC

## 2020-03-06 NOTE — Telephone Encounter (Signed)
done

## 2020-03-09 ENCOUNTER — Telehealth: Payer: Self-pay | Admitting: *Deleted

## 2020-03-09 NOTE — Telephone Encounter (Signed)
-----   Message from Caren Macadam, MD sent at 03/06/2020  5:27 PM EST ----- Thanks Dr. Maryan Rued.   Mechele Claude - can you call and check in on her? See how she is feeling? See how rectal bleeding is doing. Will need repeat CBC hopefully this week.   ----- Message ----- From: Blanchie Dessert, MD Sent: 03/04/2020   2:40 PM EST To: Caren Macadam, MD, Mauri Pole, MD  Just wanted you to know this patient came to the emergency room today.  She has been having 4 to 5 days of worsening weakness and general malaise.  She did test positive for Covid but also has worsening anemia now with a hemoglobin of 8.2.  She has had some rectal bleeding but denies any visible bleeding in the last 3 to 4 days.  Discussed with her doing a rectal exam and contacting GI at the hospital today but patient is not interested.  Reported that she may need to stay in the hospital for observation versus a blood transfusion and she reports she does not want a blood transfusion.  Did encourage patient to call the offices tomorrow so that she can have follow-up hemoglobin done but wanted to make you aware.

## 2020-03-09 NOTE — Telephone Encounter (Signed)
Spoke with the pt and she stated she is feeling about the same, "feels like death", is home alone, denies rectal bleeding and stated the hospital did nothing to help her.  Patient stated she is taking Prednisone also and thought I was calling her regarding Covid.  I advised the pt I was not calling for this reason but a message can be sent to her PCP, advised if she is not feeling better she should go to back to the ER and the pt hung up.  Message sent to PCP.

## 2020-03-09 NOTE — Telephone Encounter (Signed)
Patient is returning a call.  Please advise.

## 2020-03-09 NOTE — Telephone Encounter (Signed)
Left a message for the pt to return my call.  

## 2020-03-10 ENCOUNTER — Other Ambulatory Visit: Payer: Self-pay | Admitting: Family Medicine

## 2020-03-11 ENCOUNTER — Inpatient Hospital Stay (HOSPITAL_COMMUNITY)
Admission: EM | Admit: 2020-03-11 | Discharge: 2020-03-20 | DRG: 178 | Disposition: A | Discharge status: Skilled Nursing Facility | Payer: Medicare Other | Attending: Internal Medicine | Admitting: Internal Medicine

## 2020-03-11 ENCOUNTER — Other Ambulatory Visit: Payer: Self-pay

## 2020-03-11 ENCOUNTER — Emergency Department (HOSPITAL_COMMUNITY): Payer: Medicare Other

## 2020-03-11 ENCOUNTER — Encounter (HOSPITAL_COMMUNITY): Payer: Self-pay | Admitting: Emergency Medicine

## 2020-03-11 DIAGNOSIS — E871 Hypo-osmolality and hyponatremia: Secondary | ICD-10-CM | POA: Diagnosis not present

## 2020-03-11 DIAGNOSIS — I10 Essential (primary) hypertension: Secondary | ICD-10-CM | POA: Diagnosis not present

## 2020-03-11 DIAGNOSIS — Z881 Allergy status to other antibiotic agents status: Secondary | ICD-10-CM

## 2020-03-11 DIAGNOSIS — Z8261 Family history of arthritis: Secondary | ICD-10-CM | POA: Diagnosis not present

## 2020-03-11 DIAGNOSIS — E039 Hypothyroidism, unspecified: Secondary | ICD-10-CM | POA: Diagnosis not present

## 2020-03-11 DIAGNOSIS — Z7189 Other specified counseling: Secondary | ICD-10-CM | POA: Diagnosis not present

## 2020-03-11 DIAGNOSIS — D649 Anemia, unspecified: Secondary | ICD-10-CM | POA: Diagnosis not present

## 2020-03-11 DIAGNOSIS — K5790 Diverticulosis of intestine, part unspecified, without perforation or abscess without bleeding: Secondary | ICD-10-CM | POA: Diagnosis not present

## 2020-03-11 DIAGNOSIS — E876 Hypokalemia: Secondary | ICD-10-CM | POA: Diagnosis not present

## 2020-03-11 DIAGNOSIS — K922 Gastrointestinal hemorrhage, unspecified: Secondary | ICD-10-CM | POA: Diagnosis present

## 2020-03-11 DIAGNOSIS — Z888 Allergy status to other drugs, medicaments and biological substances status: Secondary | ICD-10-CM

## 2020-03-11 DIAGNOSIS — Z800 Family history of malignant neoplasm of digestive organs: Secondary | ICD-10-CM

## 2020-03-11 DIAGNOSIS — R5381 Other malaise: Secondary | ICD-10-CM | POA: Diagnosis present

## 2020-03-11 DIAGNOSIS — K5190 Ulcerative colitis, unspecified, without complications: Secondary | ICD-10-CM | POA: Diagnosis present

## 2020-03-11 DIAGNOSIS — R6889 Other general symptoms and signs: Secondary | ICD-10-CM | POA: Diagnosis not present

## 2020-03-11 DIAGNOSIS — Z8052 Family history of malignant neoplasm of bladder: Secondary | ICD-10-CM | POA: Diagnosis not present

## 2020-03-11 DIAGNOSIS — K6389 Other specified diseases of intestine: Secondary | ICD-10-CM | POA: Diagnosis not present

## 2020-03-11 DIAGNOSIS — R0902 Hypoxemia: Secondary | ICD-10-CM | POA: Diagnosis not present

## 2020-03-11 DIAGNOSIS — J449 Chronic obstructive pulmonary disease, unspecified: Secondary | ICD-10-CM | POA: Diagnosis not present

## 2020-03-11 DIAGNOSIS — Z79890 Hormone replacement therapy (postmenopausal): Secondary | ICD-10-CM

## 2020-03-11 DIAGNOSIS — U071 COVID-19: Secondary | ICD-10-CM | POA: Diagnosis not present

## 2020-03-11 DIAGNOSIS — E7800 Pure hypercholesterolemia, unspecified: Secondary | ICD-10-CM | POA: Diagnosis not present

## 2020-03-11 DIAGNOSIS — K219 Gastro-esophageal reflux disease without esophagitis: Secondary | ICD-10-CM | POA: Diagnosis not present

## 2020-03-11 DIAGNOSIS — R7989 Other specified abnormal findings of blood chemistry: Secondary | ICD-10-CM | POA: Diagnosis not present

## 2020-03-11 DIAGNOSIS — Z8249 Family history of ischemic heart disease and other diseases of the circulatory system: Secondary | ICD-10-CM

## 2020-03-11 DIAGNOSIS — Z66 Do not resuscitate: Secondary | ICD-10-CM | POA: Diagnosis not present

## 2020-03-11 DIAGNOSIS — Z743 Need for continuous supervision: Secondary | ICD-10-CM | POA: Diagnosis not present

## 2020-03-11 DIAGNOSIS — Z86711 Personal history of pulmonary embolism: Secondary | ICD-10-CM | POA: Diagnosis not present

## 2020-03-11 DIAGNOSIS — Z91018 Allergy to other foods: Secondary | ICD-10-CM

## 2020-03-11 DIAGNOSIS — Z515 Encounter for palliative care: Secondary | ICD-10-CM | POA: Diagnosis not present

## 2020-03-11 DIAGNOSIS — Z79899 Other long term (current) drug therapy: Secondary | ICD-10-CM

## 2020-03-11 DIAGNOSIS — D692 Other nonthrombocytopenic purpura: Secondary | ICD-10-CM | POA: Diagnosis not present

## 2020-03-11 DIAGNOSIS — Z8271 Family history of polycystic kidney: Secondary | ICD-10-CM

## 2020-03-11 DIAGNOSIS — D500 Iron deficiency anemia secondary to blood loss (chronic): Secondary | ICD-10-CM | POA: Diagnosis present

## 2020-03-11 DIAGNOSIS — Z8279 Family history of other congenital malformations, deformations and chromosomal abnormalities: Secondary | ICD-10-CM

## 2020-03-11 DIAGNOSIS — Z91013 Allergy to seafood: Secondary | ICD-10-CM

## 2020-03-11 DIAGNOSIS — K449 Diaphragmatic hernia without obstruction or gangrene: Secondary | ICD-10-CM | POA: Diagnosis not present

## 2020-03-11 DIAGNOSIS — R531 Weakness: Secondary | ICD-10-CM | POA: Diagnosis not present

## 2020-03-11 LAB — COMPREHENSIVE METABOLIC PANEL
ALT: 18 U/L (ref 0–44)
AST: 25 U/L (ref 15–41)
Albumin: 3.3 g/dL — ABNORMAL LOW (ref 3.5–5.0)
Alkaline Phosphatase: 40 U/L (ref 38–126)
Anion gap: 8 (ref 5–15)
BUN: 28 mg/dL — ABNORMAL HIGH (ref 8–23)
CO2: 26 mmol/L (ref 22–32)
Calcium: 9.2 mg/dL (ref 8.9–10.3)
Chloride: 99 mmol/L (ref 98–111)
Creatinine, Ser: 0.72 mg/dL (ref 0.44–1.00)
GFR, Estimated: >60 mL/min (ref >60)
Glucose, Bld: 94 mg/dL (ref 70–99)
Potassium: 4 mmol/L (ref 3.5–5.1)
Sodium: 133 mmol/L — ABNORMAL LOW (ref 135–145)
Total Bilirubin: 0.3 mg/dL (ref 0.3–1.2)
Total Protein: 6.4 g/dL — ABNORMAL LOW (ref 6.5–8.1)

## 2020-03-11 LAB — CBC WITH DIFFERENTIAL/PLATELET
Abs Immature Granulocytes: 0.43 10*3/uL — ABNORMAL HIGH (ref 0.00–0.07)
Basophils Absolute: 0 10*3/uL (ref 0.0–0.1)
Basophils Relative: 0 %
Eosinophils Absolute: 0 10*3/uL (ref 0.0–0.5)
Eosinophils Relative: 0 %
HCT: 24.1 % — ABNORMAL LOW (ref 36.0–46.0)
Hemoglobin: 7.6 g/dL — ABNORMAL LOW (ref 12.0–15.0)
Immature Granulocytes: 4 %
Lymphocytes Relative: 26 %
Lymphs Abs: 2.7 10*3/uL (ref 0.7–4.0)
MCH: 30.3 pg (ref 26.0–34.0)
MCHC: 31.5 g/dL (ref 30.0–36.0)
MCV: 96 fL (ref 80.0–100.0)
Monocytes Absolute: 1.9 10*3/uL — ABNORMAL HIGH (ref 0.1–1.0)
Monocytes Relative: 18 %
Neutro Abs: 5.2 10*3/uL (ref 1.7–7.7)
Neutrophils Relative %: 52 %
Platelets: 553 10*3/uL — ABNORMAL HIGH (ref 150–400)
RBC: 2.51 MIL/uL — ABNORMAL LOW (ref 3.87–5.11)
RDW: 14.7 % (ref 11.5–15.5)
WBC: 10.3 10*3/uL (ref 4.0–10.5)
nRBC: 0.3 % — ABNORMAL HIGH (ref 0.0–0.2)

## 2020-03-11 LAB — POC OCCULT BLOOD, ED: Fecal Occult Bld: POSITIVE — AB

## 2020-03-11 LAB — PREPARE RBC (CROSSMATCH)

## 2020-03-11 MED ORDER — ASCORBIC ACID 500 MG PO TABS
500.0000 mg | ORAL_TABLET | Freq: Every day | ORAL | Status: DC
  Administered 2020-03-11 – 2020-03-20 (×10): 500 mg via ORAL
  Filled 2020-03-11 (×10): qty 1

## 2020-03-11 MED ORDER — ACETAMINOPHEN 325 MG PO TABS
650.0000 mg | ORAL_TABLET | Freq: Four times a day (QID) | ORAL | Status: DC | PRN
  Administered 2020-03-11 – 2020-03-12 (×2): 650 mg via ORAL
  Filled 2020-03-11 (×2): qty 2

## 2020-03-11 MED ORDER — ZINC SULFATE 220 (50 ZN) MG PO CAPS
220.0000 mg | ORAL_CAPSULE | Freq: Every day | ORAL | Status: DC
  Administered 2020-03-11 – 2020-03-20 (×10): 220 mg via ORAL
  Filled 2020-03-11 (×10): qty 1

## 2020-03-11 MED ORDER — IPRATROPIUM-ALBUTEROL 20-100 MCG/ACT IN AERS
1.0000 | INHALATION_SPRAY | Freq: Four times a day (QID) | RESPIRATORY_TRACT | Status: DC
  Administered 2020-03-11 – 2020-03-16 (×21): 1 via RESPIRATORY_TRACT
  Filled 2020-03-11 (×2): qty 4

## 2020-03-11 MED ORDER — SODIUM CHLORIDE 0.9% IV SOLUTION: Freq: Once | INTRAVENOUS | Status: AC

## 2020-03-11 MED ORDER — HYDROCOD POLST-CPM POLST ER 10-8 MG/5ML PO SUER
5.0000 mL | Freq: Two times a day (BID) | ORAL | Status: DC | PRN
  Administered 2020-03-16: 5 mL via ORAL
  Filled 2020-03-11: qty 5

## 2020-03-11 MED ORDER — GUAIFENESIN-DM 100-10 MG/5ML PO SYRP
10.0000 mL | ORAL_SOLUTION | ORAL | Status: DC | PRN
  Administered 2020-03-11: 10 mL via ORAL
  Filled 2020-03-11 (×3): qty 10

## 2020-03-11 MED ORDER — ONDANSETRON HCL 4 MG PO TABS: 4.0000 mg | ORAL_TABLET | Freq: Four times a day (QID) | ORAL | Status: DC | PRN

## 2020-03-11 MED ORDER — ONDANSETRON HCL 4 MG/2ML IJ SOLN
4.0000 mg | Freq: Four times a day (QID) | INTRAMUSCULAR | Status: DC | PRN
  Administered 2020-03-16: 4 mg via INTRAVENOUS
  Filled 2020-03-11: qty 2

## 2020-03-11 NOTE — Telephone Encounter (Signed)
I did try to call patient. No answer. I agree with her going to ER. I was going to offer to call hospital to discuss her status so they were aware of her coming, which may help with her care.   JoAnne - if she/son call back; please find out what ER she would like to go to and I can call ahead for her.

## 2020-03-11 NOTE — Telephone Encounter (Signed)
Left a message for the pt to return my call.  Spoke with the pts son [name])  and informed him of the message below also.  [name] states the pt called 911 again and the paramedics told her she didn't need to go to the ER unless she wanted to after their assessment.  I gave [name] the number to contact the Covid care clinic and advised him again of the message below.  He stated the pt does not want to go to the ER due to a long wait and I questioned if he could take/assist her?  [name] stated he cannot take her as the pt cannot walk to the car.  I advised [name] she should call 911, inform the paramedics of the recommendation per PCP as below and he agreed.  Message sent to PCP.

## 2020-03-11 NOTE — Telephone Encounter (Signed)
I really do encourage her to go back to ER if feeling poorly. Anemia may be worsening and she could be having other side effects with COVID that are contributing to her feeling poorly. I am limited with what I can do in the office with her recent COVID test being positive. We could offer her follow up at covid clinic if she would prefer? They could eval and repeat cbc to see if stable. Not sure availability though?

## 2020-03-11 NOTE — Consult Note (Addendum)
Referring Provider: EDP Primary Care Physician:  Caren Macadam, MD Primary Gastroenterologist:  Dr. Silverio Decamp  Reason for Consultation:  Anemia and heme positive stool  HPI: [name] is a 86 y.o. female with PMH listed below.  Presented to [facility] ED with increasing weakness.  Positive for Covid.  Hgb found to be down to 7.6 grams as compared to 8.2 grams just one week ago.  Is heme positive.  She denies any overt GI bleeding including black or bloody stools.  She says that she has been very pleased because she is doing well from a GI standpoint.  She takes over-the-counter oral iron supplements once daily and takes her mesalamine 2.4 g daily for history of proctosigmoid colitis.  In 2007 patient underwent colonoscopy by Dr. Henrene Pastor.  The exam was complete but prep was poor significantly limiting visualization.  No biopsies were taken.  No polyps found.  Patient says that she developed bowel perforation following the procedure and required surgery.    In 2015 she was referred to [place] for heme positive stool and anemia.  She agreed to endoscopic work-up but wanted Dr. Deatra Ina to perform the procedures.  EGD remarkable for esophageal stricture and gastritis.  Complete colonoscopy was carried out with findings of diverticulosis and abnormal rectal mucosa.  Gastric biopsies compatible with slight chronic gastritis, no H. pylori.  Rectal biopsies were negative for adenomatous change or malignancy  Inpatient flexible sigmoidoscopy 11/2019 concerning for proctosigmoid colitis, left-sided diverticulosis associated with narrowing.  Biopsies compatible with chronic active colitis.  Patient was started on oral mesalamine and [place] suppositories.  She is maintained on 2.4 g of oral mesalamine and has been doing well.  She declines blood transfusion and says that she has been told in the past that she should not have blood transfusion performed so has always managed without them.   Past Medical History:   Diagnosis Date  . ABDOMINAL PAIN, CHRONIC 05/28/2008  . ALLERGIC RHINITIS 09/21/2006  . ANEMIA-NOS 09/21/2006  . ASTHMA 09/21/2006  . ASYMPTOMATIC POSTMENOPAUSAL STATUS 05/28/2008  . Cataract   . CHEST PAIN, ATYPICAL 05/28/2008  . COPD (chronic obstructive pulmonary disease) ([place])   . COVID-19 virus infection 02/2020  . DDD (degenerative disc disease)   . DIVERTICULITIS, HX OF 09/21/2006  . GERD 09/21/2006  . HYPERCHOLESTEROLEMIA 06/05/2009  . HYPERTENSION 09/21/2006  . HYPOTHYROIDISM 09/21/2006  . Peptic ulcer disease   . Purpura ([place])   . RENAL INSUFFICIENCY 09/21/2006  . Shingles   . Small bowel obstruction ([place])   . Somatization disorder 04/25/2007  . UNSPECIFIED URINARY CALCULUS 05/28/2008  . UTI (urinary tract infection) 07/2019    Past Surgical History:  Procedure Laterality Date  . ABDOMINAL HYSTERECTOMY  1981  . acute Nephritis  1958  . APPENDECTOMY  1959  . BIOPSY  11/18/2019   Procedure: BIOPSY;  Surgeon: Ladene Artist, MD;  Location: [facility];  Service: Endoscopy;;  . CESAREAN SECTION    . CHOLECYSTECTOMY  1983  . COLON SURGERY    . DENTAL SURGERY  1970  . FLEXIBLE SIGMOIDOSCOPY N/A 11/18/2019   Procedure: FLEXIBLE SIGMOIDOSCOPY;  Surgeon: Ladene Artist, MD;  Location: [facility];  Service: Endoscopy;  Laterality: N/A;  . NASAL SINUS SURGERY  1967  . Pulmonary thrombosis    . TONSILLECTOMY    . TUBAL LIGATION      Prior to Admission medications   Medication Sig Start Date End Date Taking? Authorizing Provider  acetaminophen (TYLENOL) 325 MG tablet Take 650  mg by mouth every 4 (four) hours as needed for mild pain or headache.     [provider]  Cholecalciferol (VITAMIN D3 PO) Take 1 tablet by mouth 2 (two) times a week. Saturday and Sunday    [provider]  Ferrous Sulfate (IRON) 325 (65 FE) MG TABS Take 1 tablet by mouth daily.     [provider]  FLOVENT HFA 110 MCG/ACT inhaler INHALE 1 PUFF BY MOUTH TWICE A DAY 03/04/20    Caren Macadam, MD  levothyroxine (SYNTHROID) 50 MCG tablet Take 1 tablet (50 mcg total) by mouth every Monday, Tuesday, Wednesday, Thursday, and Friday. 03/06/20   Caren Macadam, MD  levothyroxine (SYNTHROID) 75 MCG tablet TAKE 1 TABLET BY MOUTH 2 DAYS PER WEEK 01/23/20   Burchette, Alinda Sierras, MD  mesalamine (CANASA) 1000 MG suppository Place 1 suppository (1,000 mg total) rectally at bedtime for 14 days. 01/07/20 01/21/20  Willia Craze, NP  mesalamine (LIALDA) 1.2 g EC tablet TAKE 2 TABLETS BY MOUTH DAILY WITH BREAKFAST. 02/27/20   Willia Craze, NP  predniSONE (DELTASONE) 20 MG tablet Take 2 tablets (40 mg total) by mouth daily. 03/04/20   Blanchie Dessert, MD  Spacer/Aero-Holding Chambers (E-Z SPACER) inhaler Use as instructed 11/08/17   Caren Macadam, MD    No current facility-administered medications for this encounter.   Current Outpatient Medications  Medication Sig Dispense Refill  . acetaminophen (TYLENOL) 325 MG tablet Take 650 mg by mouth every 4 (four) hours as needed for mild pain or headache.     . Cholecalciferol (VITAMIN D3 PO) Take 1 tablet by mouth 2 (two) times a week. Saturday and Sunday    . Ferrous Sulfate (IRON) 325 (65 FE) MG TABS Take 1 tablet by mouth daily.     Marland Kitchen FLOVENT HFA 110 MCG/ACT inhaler INHALE 1 PUFF BY MOUTH TWICE A DAY 12 each 1  . levothyroxine (SYNTHROID) 50 MCG tablet Take 1 tablet (50 mcg total) by mouth every Monday, Tuesday, Wednesday, Thursday, and Friday. 90 tablet 1  . levothyroxine (SYNTHROID) 75 MCG tablet TAKE 1 TABLET BY MOUTH 2 DAYS PER WEEK 26 tablet 3  . mesalamine (CANASA) 1000 MG suppository Place 1 suppository (1,000 mg total) rectally at bedtime for 14 days. 14 suppository 0  . mesalamine (LIALDA) 1.2 g EC tablet TAKE 2 TABLETS BY MOUTH DAILY WITH BREAKFAST. 180 tablet 1  . predniSONE (DELTASONE) 20 MG tablet Take 2 tablets (40 mg total) by mouth daily. 10 tablet 0  . Spacer/Aero-Holding Chambers (E-Z SPACER) inhaler  Use as instructed 1 each 2    Allergies as of 03/11/2020 - Review Complete 03/11/2020  Allergen Reaction Noted  . [place] nut (berthollefia [place]) skin test Anaphylaxis 06/04/2019  . Fluconazole Shortness Of Breath 02/23/2017  . Metronidazole Shortness Of Breath and Nausea And Vomiting 10/07/2013  . Pepcid [famotidine] Shortness Of Breath and Other (See Comments) 10/07/2013  . Shellfish allergy Anaphylaxis 06/04/2019  . Macrobid [nitrofurantoin]    . Pantoprazole Other (See Comments) 10/30/2013    Family History  Problem Relation Age of Onset  . Polycystic kidney disease Mother   . Pancreatic cancer Mother   . Other Father        Schamberg disease  . Bladder Cancer Father   . Hypertension Father   . Marfan syndrome Son   . Hemochromatosis Son   . Cirrhosis Son   . Allergic rhinitis Sister   . Other Brother  bone issue as child; multiple fractures but seemed to age out of this  . Hemochromatosis Cousin   . Arthritis Sister   . Colon cancer Neg Hx   . Esophageal cancer Neg Hx   . Stomach cancer Neg Hx     Social History   Socioeconomic History  . Marital status: Divorced    Spouse name: Not on file  . Number of children: 3  . Years of education: Not on file  . Highest education level: Not on file  Occupational History  . Occupation: retired  Tobacco Use  . Smoking status: Never Smoker  . Smokeless tobacco: Never Used  Vaping Use  . Vaping Use: Never used  Substance and Sexual Activity  . Alcohol use: No  . Drug use: No  . Sexual activity: Not Currently  Other Topics Concern  . Not on file  Social History Narrative  . Not on file   Social Determinants of Health   Financial Resource Strain: Low Risk   . Difficulty of Paying Living Expenses: Not hard at all  Food Insecurity: No Food Insecurity  . Worried About Charity fundraiser in the Last Year: Never true  . Ran Out of Food in the Last Year: Never true  Transportation Needs: No Transportation  Needs  . Lack of Transportation (Medical): No  . Lack of Transportation (Non-Medical): No  Physical Activity: Inactive  . Days of Exercise per Week: 0 days  . Minutes of Exercise per Session: 0 min  Stress: No Stress Concern Present  . Feeling of Stress : Only a little  Social Connections: Moderately Isolated  . Frequency of Communication with Friends and Family: More than three times a week  . Frequency of Social Gatherings with Friends and Family: Three times a week  . Attends Religious Services: More than 4 times per year  . Active Member of Clubs or Organizations: No  . Attends Archivist Meetings: Never  . Marital Status: Widowed  Intimate Partner Violence: Not At Risk  . Fear of Current or Ex-Partner: No  . Emotionally Abused: No  . Physically Abused: No  . Sexually Abused: No    Review of Systems: ROS is O/W negative except as mentioned in HPI.  Physical Exam: Vital signs in last 24 hours: Temp:  [98.3 F (36.8 C)] 98.3 F (36.8 C) (02/02 1258) Pulse Rate:  [74-82] 82 (02/02 1421) Resp:  [16-28] 28 (02/02 1421) BP: (126-138)/(46-64) 126/46 (02/02 1421) SpO2:  [98 %-99 %] 98 % (02/02 1421) Weight:  [63.5 kg] 63.5 kg (02/02 1255)   General:   Alert, Well-developed, well-nourished, pleasant and cooperative in NAD Head:  Normocephalic and atraumatic. Eyes:  Sclera clear, no icterus.  Conjunctiva pink. Ears:  Normal auditory acuity. Mouth:  No deformity or lesions.   Lungs:  Clear throughout to auscultation.  No wheezes, crackles, or rhonchi.  Heart:  Regular rate and rhythm; no murmurs, clicks, rubs, or gallops. Abdomen:  Soft, non-distended.  BS present.  Non-tender.  Rectal:  Deferred.  Heme positive.  Msk:  Symmetrical without gross deformities. Pulses:  Normal pulses noted. Extremities:  Without clubbing or edema. Neurologic:  Alert and oriented x 4;  grossly normal neurologically. Skin:  Intact without significant lesions or rashes. Psych:  Alert  and cooperative. Normal mood and affect.  Lab Results: Recent Labs    03/11/20 1259  WBC 10.3  HGB 7.6*  HCT 24.1*  PLT 553*   BMET Recent Labs    03/11/20  1259  NA 133*  K 4.0  CL 99  CO2 26  GLUCOSE 94  BUN 28*  CREATININE 0.72  CALCIUM 9.2   LFT Recent Labs    03/11/20 1259  PROT 6.4*  ALBUMIN 3.3*  AST 25  ALT 18  ALKPHOS 40  BILITOT 0.3   Studies/Results: DG Chest Port 1 View  Result Date: 03/11/2020 CLINICAL DATA:  COVID patient. EXAM: PORTABLE CHEST 1 VIEW COMPARISON:  03/04/2020 FINDINGS: Normal heart size. Large hiatal hernia is again identified. Asymmetric elevation of right hemidiaphragm. No pleural effusion or edema. No airspace consolidation. IMPRESSION: 1. No acute cardiopulmonary abnormalities. 2. Large hiatal hernia. Electronically Signed   By: Kerby Moors M.D.   On: 03/11/2020 13:53   IMPRESSION:  *Acute on chronic anemia:  Baseline around 10 grams recently.  Now down to 8.2 g last week and 7.6 g today.  Patient declining blood transfusion as she has been told in the past that she should not have them.  She has declined colonoscopy on multiple occasions.  Also declining EGD for now.  She has had no overt gastrointestinal bleeding and feels like she has been doing well from a GI standpoint.  She was heme positive today  *Proctosigmoiditis:  Recently diagnosed on inpatient flexible sigmoidoscopy. Diffuse moderate inflammation was found in the rectum and in the sigmoid colon to 20 cm.  Biopsies compatible with chronic active colitis.  Doing well on mesalamine 2.4 g daily  *Covid 19:  Positive.  PLAN: -Patient is declining blood transfusion as well as EGD and colonoscopy at this time. -We will check iron studies and if low then will give inpatient IV iron infusion. -Continue mesalamine 2.4 g daily.   Laban Emperor. Zehr  03/11/2020, 3:17 PM   Attending physician's note   I have taken a history, examined the patient and reviewed the chart. I agree with  the Advanced Practitioner's note, impression and recommendations.  24 yr F here with generalized fatigue, nausea and decreased appetite.  She has Covid-19 infection  She was recently hospitalized in October 2021, had proctosigmoiditis.  Biopsies were consistent with chronic active ulcerative colitis.  She was treated with prednisone taper for acute flare of symptoms in December and is currently on oral mesalamine. She has not been having any rectal bleeding.  Bowel habits are at baseline.  Drop in Hemoglobin from baseline 10 --> 7.6, anemia secondary to chronic occult GI blood loss No overt or active GI bleeding   Patient declined EGD and colonoscopy  Continue to monitor daily hemoglobin and transfuse as needed Follow-up iron studies, IV iron infusion if low Continue mesalamine  Continue supportive care  GI is available if needed, please call with any questions   . The patient was provided an opportunity to ask questions and all were answered. The patient agreed with the plan and demonstrated an understanding of the instructions.  Damaris Hippo , MD [phone]

## 2020-03-11 NOTE — ED Notes (Signed)
[name], [place] made aware patient has a headache and would like some medication for it. No new orders received.

## 2020-03-11 NOTE — ED Notes (Signed)
This RN spoke with patient in depth. Educated patient on purpose of blood transfusion, process of screening blood and monitoring while receiving transfusion and after. This RN answered all of patients questions and reiterated the potential benefits of receiving blood such as improvement in symptoms of SOB, weakness and fatigue. Patient verbalized understanding and consented to receive transfusion.

## 2020-03-11 NOTE — ED Triage Notes (Addendum)
Patient BIB [facility] c/o generalized weakness 2-3 days.  Patient is covid positive, took home test a week ago.  Hx anemia, baseline hgb 10, now 8.2 at [facility] a week ago.  Ambulatory with assistance.  Son reported to EMS that patient is more pale than normal and some green sputum. EMS placed 18g left FA. Patient denies any bleeding.  Vitals were 138/90 84-HR 97% on room air 120-CBG 18-RR

## 2020-03-11 NOTE — H&P (Addendum)
History and Physical    [name] FHL:[id number] DOB: 1934/04/09 DOA: 03/11/2020  PCP: Caren Macadam, MD  Patient coming from: Independent living  Chief Complaint: Cough and fatigue.  HPI: [name] is a 86 y.o. female with medical history significant of asthma, Purpura, hypothyroidism. Presenting with 10 days of cough and fatigue. She reports that she was seen in the ED about a week ago and given a diagnosis of COVID infection. She was sent home with recommendations for isolation and outpt follow up for infusion treatment. At the time she was also diagnosed with symptomatic anemia, but did not wish to have full workup performed. It was recommended that she follow up with her GI specialist. Since that ED visit, her cough and weakness have worsened. She attempted to call her PCP for eval this morning, but decided to come to the ER. She has not tried any medications other than her normal meds. She denies any other aggravating or alleviating factors.      ED Course: CXR clear. Found to have a hemoglobin of 7.6 (baseline 10). FOBT positive. GI consulted. TRH called for admission.   Review of Systems:  Denies CP, palpitations, dyspnea, N/V/D, fever. Review of systems is otherwise negative for all not mentioned in HPI.   PMHx Past Medical History:  Diagnosis Date  . ABDOMINAL PAIN, CHRONIC 05/28/2008  . ALLERGIC RHINITIS 09/21/2006  . ANEMIA-NOS 09/21/2006  . ASTHMA 09/21/2006  . ASYMPTOMATIC POSTMENOPAUSAL STATUS 05/28/2008  . Cataract   . CHEST PAIN, ATYPICAL 05/28/2008  . COPD (chronic obstructive pulmonary disease) ([place])   . COVID-19 virus infection 02/2020  . DDD (degenerative disc disease)   . DIVERTICULITIS, HX OF 09/21/2006  . GERD 09/21/2006  . HYPERCHOLESTEROLEMIA 06/05/2009  . HYPERTENSION 09/21/2006  . HYPOTHYROIDISM 09/21/2006  . Peptic ulcer disease   . Purpura ([place])   . RENAL INSUFFICIENCY 09/21/2006  . Shingles   . Small bowel obstruction ([place])   . Somatization disorder  04/25/2007  . UNSPECIFIED URINARY CALCULUS 05/28/2008  . UTI (urinary tract infection) 07/2019    PSHx Past Surgical History:  Procedure Laterality Date  . ABDOMINAL HYSTERECTOMY  1981  . acute Nephritis  1958  . APPENDECTOMY  1959  . BIOPSY  11/18/2019   Procedure: BIOPSY;  Surgeon: Ladene Artist, MD;  Location: [facility];  Service: Endoscopy;;  . CESAREAN SECTION    . CHOLECYSTECTOMY  1983  . COLON SURGERY    . DENTAL SURGERY  1970  . FLEXIBLE SIGMOIDOSCOPY N/A 11/18/2019   Procedure: FLEXIBLE SIGMOIDOSCOPY;  Surgeon: Ladene Artist, MD;  Location: [facility];  Service: Endoscopy;  Laterality: N/A;  . NASAL SINUS SURGERY  1967  . Pulmonary thrombosis    . TONSILLECTOMY    . TUBAL LIGATION      SocHx  reports that she has never smoked. She has never used smokeless tobacco. She reports that she does not drink alcohol and does not use drugs.  Allergies  Allergen Reactions  . [place] Nut (Berthollefia [place]) Skin Test Anaphylaxis  . Fluconazole Shortness Of Breath  . Metronidazole Shortness Of Breath and Nausea And Vomiting  . Pepcid [Famotidine] Shortness Of Breath and Other (See Comments)    Dizziness  . Shellfish Allergy Anaphylaxis  . Macrobid [Nitrofurantoin]     REACTION: Syncope  . Pantoprazole Other (See Comments)    CHEST PAIN    FamHx Family History  Problem Relation Age of Onset  . Polycystic kidney disease Mother   . Pancreatic  cancer Mother   . Other Father        Schamberg disease  . Bladder Cancer Father   . Hypertension Father   . Marfan syndrome Son   . Hemochromatosis Son   . Cirrhosis Son   . Allergic rhinitis Sister   . Other Brother        bone issue as child; multiple fractures but seemed to age out of this  . Hemochromatosis Cousin   . Arthritis Sister   . Colon cancer Neg Hx   . Esophageal cancer Neg Hx   . Stomach cancer Neg Hx     Prior to Admission medications   Medication Sig Start Date End Date Taking? Authorizing  Provider  acetaminophen (TYLENOL) 325 MG tablet Take 650 mg by mouth every 4 (four) hours as needed for mild pain or headache.     [provider]  Cholecalciferol (VITAMIN D3 PO) Take 1 tablet by mouth 2 (two) times a week. Saturday and Sunday    [provider]  Ferrous Sulfate (IRON) 325 (65 FE) MG TABS Take 1 tablet by mouth daily.     [provider]  FLOVENT HFA 110 MCG/ACT inhaler INHALE 1 PUFF BY MOUTH TWICE A DAY 03/04/20   Caren Macadam, MD  levothyroxine (SYNTHROID) 50 MCG tablet Take 1 tablet (50 mcg total) by mouth every Monday, Tuesday, Wednesday, Thursday, and Friday. 03/06/20   Caren Macadam, MD  levothyroxine (SYNTHROID) 75 MCG tablet TAKE 1 TABLET BY MOUTH 2 DAYS PER WEEK 01/23/20   Burchette, Alinda Sierras, MD  mesalamine (CANASA) 1000 MG suppository Place 1 suppository (1,000 mg total) rectally at bedtime for 14 days. 01/07/20 01/21/20  Willia Craze, NP  mesalamine (LIALDA) 1.2 g EC tablet TAKE 2 TABLETS BY MOUTH DAILY WITH BREAKFAST. 02/27/20   Willia Craze, NP  predniSONE (DELTASONE) 20 MG tablet Take 2 tablets (40 mg total) by mouth daily. 03/04/20   Blanchie Dessert, MD  Spacer/Aero-Holding Chambers (E-Z SPACER) inhaler Use as instructed 11/08/17   Caren Macadam, MD    Physical Exam: Vitals:   03/11/20 1258 03/11/20 1300 03/11/20 1421 03/11/20 1542  BP:  (!) 128/58 (!) 126/46 (!) 143/67  Pulse:  74 82 67  Resp:  20 (!) 28 20  Temp: 98.3 F (36.8 C)     TempSrc: Oral     SpO2:  99% 98% 98%  Weight:      Height:        General: 86 y.o. female resting in bed in NAD Eyes: PERRL, normal sclera ENMT: Nares patent w/o discharge, orophaynx clear, dentition normal, ears w/o discharge/lesions/ulcers Neck: Supple, trachea midline Cardiovascular: RRR, +S1, S2, no m/g/r, equal pulses throughout Respiratory: CTABL, upper airway transmission. No w/r/r, mildly breathless w/ speech GI: BS+, NDNT, no masses noted, no organomegaly  noted MSK: No e/c/c Neuro: A&O x 3, no focal deficits Psyc: Anxious interacion and appropriate affect, cooperative  Labs on Admission: I have personally reviewed following labs and imaging studies  CBC: Recent Labs  Lab 03/11/20 1259  WBC 10.3  NEUTROABS 5.2  HGB 7.6*  HCT 24.1*  MCV 96.0  PLT 347*   Basic Metabolic Panel: Recent Labs  Lab 03/11/20 1259  NA 133*  K 4.0  CL 99  CO2 26  GLUCOSE 94  BUN 28*  CREATININE 0.72  CALCIUM 9.2   GFR: Estimated Creatinine Clearance: 47.9 mL/min (by C-G formula based on SCr of 0.72 mg/dL). Liver Function Tests: Recent Labs  Lab 03/11/20 1259  AST 25  ALT 18  ALKPHOS 40  BILITOT 0.3  PROT 6.4*  ALBUMIN 3.3*   No results for input(s): LIPASE, AMYLASE in the last 168 hours. No results for input(s): AMMONIA in the last 168 hours. Coagulation Profile: No results for input(s): INR, PROTIME in the last 168 hours. Cardiac Enzymes: No results for input(s): CKTOTAL, CKMB, CKMBINDEX, TROPONINI in the last 168 hours. BNP (last 3 results) No results for input(s): PROBNP in the last 8760 hours. HbA1C: No results for input(s): HGBA1C in the last 72 hours. CBG: No results for input(s): GLUCAP in the last 168 hours. Lipid Profile: No results for input(s): CHOL, HDL, LDLCALC, TRIG, CHOLHDL, LDLDIRECT in the last 72 hours. Thyroid Function Tests: No results for input(s): TSH, T4TOTAL, FREET4, T3FREE, THYROIDAB in the last 72 hours. Anemia Panel: No results for input(s): VITAMINB12, FOLATE, FERRITIN, TIBC, IRON, RETICCTPCT in the last 72 hours. Urine analysis:    Component Value Date/Time   COLORURINE STRAW (A) 11/16/2019 2016   APPEARANCEUR CLEAR 11/16/2019 2016   LABSPEC 1.018 11/16/2019 2016   PHURINE 8.0 11/16/2019 2016   GLUCOSEU NEGATIVE 11/16/2019 2016   GLUCOSEU NEGATIVE 11/21/2016 1510   HGBUR NEGATIVE 11/16/2019 2016   BILIRUBINUR NEGATIVE 11/16/2019 2016   BILIRUBINUR negative 09/12/2019 [place]  11/16/2019 2016   PROTEINUR NEGATIVE 11/16/2019 2016   UROBILINOGEN 0.2 09/12/2019 1030   UROBILINOGEN 0.2 06/26/2017 1546   NITRITE NEGATIVE 11/16/2019 2016   LEUKOCYTESUR NEGATIVE 11/16/2019 2016    Radiological Exams on Admission: DG Chest Port 1 View  Result Date: 03/11/2020 CLINICAL DATA:  COVID patient. EXAM: PORTABLE CHEST 1 VIEW COMPARISON:  03/04/2020 FINDINGS: Normal heart size. Large hiatal hernia is again identified. Asymmetric elevation of right hemidiaphragm. No pleural effusion or edema. No airspace consolidation. IMPRESSION: 1. No acute cardiopulmonary abnormalities. 2. Large hiatal hernia. Electronically Signed   By: Kerby Moors M.D.   On: 03/11/2020 13:53    Assessment/Plan GIB Symptomatic anemia Hx of chronic active UC     - place in obs, tele     - GI was consulted; patient has refused blood transfusion, EGD, and c-scope     - iron studies ordered; transfuse iron is low and continue her mesalamine  COVID 19 infection     - first positive 1/26; unvaccinated, no previous treatment     - declines remdes and she does not qualify for decadron/solumedrol; her son reports that she in on chronic steroids; so can continue those     - will offer inhaler, anti-tussive     - she does not feel at baseline breathing per her report; follow   Hypothyroidism     - resume home meds once reconciled  Purpura     - continue outpt follow up  DVT prophylaxis: SCDs  Code Status: DNR  Family Communication: None at bedside  Consults called: EDP spoke with [facility].    Status is: Observation  The patient remains OBS appropriate and will d/c before 2 midnights.  Dispo: The patient [facility]: [facility]              Anticipated d/c is to: [facility]              Anticipated d/c date is: 1 day              Patient currently is not medically stable to d/c.   Difficult to place patient No  Jonnie Finner DO [facility]  If  7PM-7AM, please contact night-coverage [email or web address]  03/11/2020,  4:18 PM

## 2020-03-11 NOTE — ED Provider Notes (Signed)
[facility] DEPT Provider Note   CSN: [id number] Arrival date & time: 03/11/20  1238     History No chief complaint on file.   [name] is a 86 y.o. female.  Pt reports she was diagnosed with covid on 1/26.  Pt reports she is having increased weakness.  Pt reports she has congestion and drainage.   The history is provided by the patient. No language interpreter was used.  Cough Cough characteristics:  Non-productive Sputum characteristics:  Nondescript Severity:  Mild Onset quality:  Gradual Timing:  Constant Progression:  Unchanged Context: sick contacts   Relieved by:  Nothing Worsened by:  Nothing Ineffective treatments:  None tried Associated symptoms: myalgias   Associated symptoms: no sore throat        Past Medical History:  Diagnosis Date  . ABDOMINAL PAIN, CHRONIC 05/28/2008  . ALLERGIC RHINITIS 09/21/2006  . ANEMIA-NOS 09/21/2006  . ASTHMA 09/21/2006  . ASYMPTOMATIC POSTMENOPAUSAL STATUS 05/28/2008  . Cataract   . CHEST PAIN, ATYPICAL 05/28/2008  . COPD (chronic obstructive pulmonary disease) ([place])   . COVID-19 virus infection 02/2020  . DDD (degenerative disc disease)   . DIVERTICULITIS, HX OF 09/21/2006  . GERD 09/21/2006  . HYPERCHOLESTEROLEMIA 06/05/2009  . HYPERTENSION 09/21/2006  . HYPOTHYROIDISM 09/21/2006  . Peptic ulcer disease   . Purpura ([place])   . RENAL INSUFFICIENCY 09/21/2006  . Shingles   . Small bowel obstruction ([place])   . Somatization disorder 04/25/2007  . UNSPECIFIED URINARY CALCULUS 05/28/2008  . UTI (urinary tract infection) 07/2019    Patient Active Problem List   Diagnosis Date Noted  . COVID-19 virus infection 02/2020  . Chronic ulcerative rectosigmoiditis with rectal bleeding ([place])   . Rectal bleeding   . Abnormal CT scan, gastrointestinal tract   . Lower GI bleed 11/16/2019  . LLQ abdominal pain 10/31/2019  . Diverticulitis 10/31/2019  . UTI (urinary tract infection) 08/06/2019  . COPD  (chronic obstructive pulmonary disease) ([place])   . Colitis   . Vitamin D deficiency 05/01/2019  . Arthralgia 08/21/2017  . Osteoporosis 11/21/2016  . Postmenopausal status 09/06/2016  . Other nonspecific abnormal finding of lung field 08/14/2015  . Hyponatremia 07/21/2014  . Menopausal state 07/18/2014  . Hematest positive stools 08/05/2013  . Anemia, blood loss 08/05/2013  . Cerumen impaction 06/03/2013  . History of adrenal disorder 10/15/2012  . Rash and nonspecific skin eruption 10/15/2012  . Iron deficiency anemia 06/20/2012  . Psychogenic polydipsia 06/20/2012  . Nonspecific abnormal electrocardiogram (ECG) (EKG) 06/20/2012  . Polyuria 05/02/2012  . Pelvic pain in female 08/16/2011  . Leg ulcer ([place]) 06/16/2010  . Routine general medical examination at a health care facility 06/11/2010  . Encounter for long-term (current) use of other medications 06/11/2010  . HYPERCHOLESTEROLEMIA 06/05/2009  . UNSPECIFIED URINARY CALCULUS 05/28/2008  . CHEST PAIN, ATYPICAL 05/28/2008  . ABDOMINAL PAIN, CHRONIC 05/28/2008  . ASYMPTOMATIC POSTMENOPAUSAL STATUS 05/28/2008  . SOMATIZATION DISORDER 04/25/2007  . Hypothyroidism 09/21/2006  . Essential hypertension 09/21/2006  . ALLERGIC RHINITIS 09/21/2006  . ASTHMA 09/21/2006  . GERD 09/21/2006  . Disorder resulting from impaired renal function 09/21/2006  . DIVERTICULITIS, HX OF 09/21/2006    Past Surgical History:  Procedure Laterality Date  . ABDOMINAL HYSTERECTOMY  1981  . acute Nephritis  1958  . APPENDECTOMY  1959  . BIOPSY  11/18/2019   Procedure: BIOPSY;  Surgeon: Ladene Artist, MD;  Location: [facility];  Service: Endoscopy;;  . CESAREAN SECTION    .  CHOLECYSTECTOMY  1983  . COLON SURGERY    . DENTAL SURGERY  1970  . FLEXIBLE SIGMOIDOSCOPY N/A 11/18/2019   Procedure: FLEXIBLE SIGMOIDOSCOPY;  Surgeon: Ladene Artist, MD;  Location: [facility];  Service: Endoscopy;  Laterality: N/A;  . NASAL SINUS SURGERY  1967  .  Pulmonary thrombosis    . TONSILLECTOMY    . TUBAL LIGATION       OB History   No obstetric history on file.     Family History  Problem Relation Age of Onset  . Polycystic kidney disease Mother   . Pancreatic cancer Mother   . Other Father        Schamberg disease  . Bladder Cancer Father   . Hypertension Father   . Marfan syndrome Son   . Hemochromatosis Son   . Cirrhosis Son   . Allergic rhinitis Sister   . Other Brother        bone issue as child; multiple fractures but seemed to age out of this  . Hemochromatosis Cousin   . Arthritis Sister   . Colon cancer Neg Hx   . Esophageal cancer Neg Hx   . Stomach cancer Neg Hx     Social History   Tobacco Use  . Smoking status: Never Smoker  . Smokeless tobacco: Never Used  Vaping Use  . Vaping Use: Never used  Substance Use Topics  . Alcohol use: No  . Drug use: No    Home Medications Prior to Admission medications   Medication Sig Start Date End Date Taking? Authorizing Provider  acetaminophen (TYLENOL) 325 MG tablet Take 650 mg by mouth every 4 (four) hours as needed for mild pain or headache.     [provider]  Cholecalciferol (VITAMIN D3 PO) Take 1 tablet by mouth 2 (two) times a week. Saturday and Sunday    [provider]  Ferrous Sulfate (IRON) 325 (65 FE) MG TABS Take 1 tablet by mouth daily.     [provider]  FLOVENT HFA 110 MCG/ACT inhaler INHALE 1 PUFF BY MOUTH TWICE A DAY 03/04/20   Caren Macadam, MD  levothyroxine (SYNTHROID) 50 MCG tablet Take 1 tablet (50 mcg total) by mouth every Monday, Tuesday, Wednesday, Thursday, and Friday. 03/06/20   Caren Macadam, MD  levothyroxine (SYNTHROID) 75 MCG tablet TAKE 1 TABLET BY MOUTH 2 DAYS PER WEEK 01/23/20   Burchette, Alinda Sierras, MD  mesalamine (CANASA) 1000 MG suppository Place 1 suppository (1,000 mg total) rectally at bedtime for 14 days. 01/07/20 01/21/20  Willia Craze, NP  mesalamine (LIALDA) 1.2 g EC tablet TAKE  2 TABLETS BY MOUTH DAILY WITH BREAKFAST. 02/27/20   Willia Craze, NP  predniSONE (DELTASONE) 20 MG tablet Take 2 tablets (40 mg total) by mouth daily. 03/04/20   Blanchie Dessert, MD  Spacer/Aero-Holding Chambers (E-Z SPACER) inhaler Use as instructed 11/08/17   Caren Macadam, MD    Allergies    [place] nut (berthollefia [place]) skin test, Fluconazole, Metronidazole, Pepcid [famotidine], Shellfish allergy, Macrobid [nitrofurantoin], and Pantoprazole  Review of Systems   Review of Systems  HENT: Negative for sore throat.   Respiratory: Positive for cough.   Musculoskeletal: Positive for myalgias.  All other systems reviewed and are negative.   Physical Exam Updated Vital Signs BP (!) 126/46 (BP Location: Left Arm)   Pulse 82   Temp 98.3 F (36.8 C) (Oral)   Resp (!) 28   Ht 5' 5"  (1.651 m)   Wt  63.5 kg   SpO2 98%   BMI 23.30 kg/m   Physical Exam Vitals and nursing note reviewed.  Constitutional:      Appearance: She is well-developed and well-nourished.  HENT:     Head: Normocephalic.     Mouth/Throat:     Mouth: Mucous membranes are moist.  Eyes:     Extraocular Movements: EOM normal.  Cardiovascular:     Rate and Rhythm: Normal rate.     Pulses: Normal pulses.  Pulmonary:     Effort: Pulmonary effort is normal.  Abdominal:     General: Abdomen is flat. There is no distension.  Musculoskeletal:        General: Normal range of motion.     Cervical back: Normal range of motion.  Skin:    General: Skin is warm.  Neurological:     General: No focal deficit present.     Mental Status: She is alert and oriented to person, place, and time.  Psychiatric:        Mood and Affect: Mood and affect and mood normal.     ED Results / Procedures / Treatments   Labs (all labs ordered are listed, but only abnormal results are displayed) Labs Reviewed  CBC WITH DIFFERENTIAL/PLATELET - Abnormal; Notable for the following components:      Result Value   RBC 2.51  (*)    Hemoglobin 7.6 (*)    HCT 24.1 (*)    Platelets 553 (*)    nRBC 0.3 (*)    Monocytes Absolute 1.9 (*)    Abs Immature Granulocytes 0.43 (*)    All other components within normal limits  COMPREHENSIVE METABOLIC PANEL - Abnormal; Notable for the following components:   Sodium 133 (*)    BUN 28 (*)    Total Protein 6.4 (*)    Albumin 3.3 (*)    All other components within normal limits  POC OCCULT BLOOD, ED - Abnormal; Notable for the following components:   Fecal Occult Bld POSITIVE (*)    All other components within normal limits    EKG None  Radiology DG Chest Port 1 View  Result Date: 03/11/2020 CLINICAL DATA:  COVID patient. EXAM: PORTABLE CHEST 1 VIEW COMPARISON:  03/04/2020 FINDINGS: Normal heart size. Large hiatal hernia is again identified. Asymmetric elevation of right hemidiaphragm. No pleural effusion or edema. No airspace consolidation. IMPRESSION: 1. No acute cardiopulmonary abnormalities. 2. Large hiatal hernia. Electronically Signed   By: Kerby Moors M.D.   On: 03/11/2020 13:53    Procedures Procedures   Medications Ordered in ED Medications - No data to display  ED Course  I have reviewed the triage vital signs and the nursing notes.  Pertinent labs & imaging results that were available during my care of the patient were reviewed by me and considered in my medical decision making (see chart for details).  Clinical Course as of 03/11/20 1511  Wed Mar 11, 2020  1511 I was directly involved in this patients medical care.  [JH]    Clinical Course User Index [JH] [place], Greggory Brandy, MD   MDM Rules/Calculators/A&P                          MDM:  Hemocult positive, Hemoglobin has dropped to 7.6  I spoke with Alonza Bogus  PA for gi.  They will consult.  She advised transfuse x 1 unit.   Hospitalist consult for admission. Dr. Marylyn Ishihara will see  for admission  Final Clinical Impression(s) / ED Diagnoses Final diagnoses:  Anemia, unspecified type  COVID     Rx / DC Orders ED Discharge Orders    None       Sidney Ace 03/11/20 1551    Luna Fuse, MD 03/11/20 2235

## 2020-03-11 NOTE — ED Notes (Signed)
Patient still complaining of a headache, Margaret Ishihara, MD made aware.

## 2020-03-11 NOTE — Telephone Encounter (Signed)
Noted  

## 2020-03-11 NOTE — Telephone Encounter (Signed)
Pt was advised to call 911 by her provider but declined to do so stating that they told her that there is nothing they can do for her and not to come back to the ER.  Pt stated that she is coughing up yellowish and green mucus and not really able to get it all up and wanted to see if she could get an call back from the provider.  Pt is aware that her provider is in seeing pts and advised her to go to the ER if she if feeling poorly.  Pt stated that she will call her son and see what he wants her to do if she really needs to go to the ER or what he thinks.  She is aware that her son knows to take her to the ER or call 911 for assistance per the advise of her provider.  Pt wanted to know if her Rx's levothyroxine (SYNTHROID) 50 MCG and prednisone (DELTASONE) 20 MG and she is aware that they should be there and to call the pharmacy.  Pt got off the phone crying wanting to see if we can call the pharmacy to check to see if her medication is there.

## 2020-03-12 DIAGNOSIS — I10 Essential (primary) hypertension: Secondary | ICD-10-CM | POA: Diagnosis not present

## 2020-03-12 DIAGNOSIS — E039 Hypothyroidism, unspecified: Secondary | ICD-10-CM

## 2020-03-12 DIAGNOSIS — K922 Gastrointestinal hemorrhage, unspecified: Secondary | ICD-10-CM | POA: Diagnosis not present

## 2020-03-12 DIAGNOSIS — U071 COVID-19: Secondary | ICD-10-CM | POA: Diagnosis not present

## 2020-03-12 LAB — BPAM RBC
Blood Product Expiration Date: 202202212359
ISSUE DATE / TIME: 202202022314
Unit Type and Rh: 9500

## 2020-03-12 LAB — MAGNESIUM: Magnesium: 1.9 mg/dL (ref 1.7–2.4)

## 2020-03-12 LAB — CBC WITH DIFFERENTIAL/PLATELET
Abs Immature Granulocytes: 0.26 10*3/uL — ABNORMAL HIGH (ref 0.00–0.07)
Basophils Absolute: 0.1 10*3/uL (ref 0.0–0.1)
Basophils Relative: 1 %
Eosinophils Absolute: 0 10*3/uL (ref 0.0–0.5)
Eosinophils Relative: 0 %
HCT: 29 % — ABNORMAL LOW (ref 36.0–46.0)
Hemoglobin: 9.3 g/dL — ABNORMAL LOW (ref 12.0–15.0)
Immature Granulocytes: 4 %
Lymphocytes Relative: 34 %
Lymphs Abs: 2.3 10*3/uL (ref 0.7–4.0)
MCH: 30.1 pg (ref 26.0–34.0)
MCHC: 32.1 g/dL (ref 30.0–36.0)
MCV: 93.9 fL (ref 80.0–100.0)
Monocytes Absolute: 1.4 10*3/uL — ABNORMAL HIGH (ref 0.1–1.0)
Monocytes Relative: 21 %
Neutro Abs: 2.8 10*3/uL (ref 1.7–7.7)
Neutrophils Relative %: 40 %
Platelets: 472 10*3/uL — ABNORMAL HIGH (ref 150–400)
RBC: 3.09 MIL/uL — ABNORMAL LOW (ref 3.87–5.11)
RDW: 15.2 % (ref 11.5–15.5)
WBC: 6.7 10*3/uL (ref 4.0–10.5)
nRBC: 0.3 % — ABNORMAL HIGH (ref 0.0–0.2)

## 2020-03-12 LAB — PHOSPHORUS: Phosphorus: 3.5 mg/dL (ref 2.5–4.6)

## 2020-03-12 LAB — COMPREHENSIVE METABOLIC PANEL
ALT: 17 U/L (ref 0–44)
AST: 23 U/L (ref 15–41)
Albumin: 3.2 g/dL — ABNORMAL LOW (ref 3.5–5.0)
Alkaline Phosphatase: 43 U/L (ref 38–126)
Anion gap: 9 (ref 5–15)
BUN: 19 mg/dL (ref 8–23)
CO2: 21 mmol/L — ABNORMAL LOW (ref 22–32)
Calcium: 8.4 mg/dL — ABNORMAL LOW (ref 8.9–10.3)
Chloride: 101 mmol/L (ref 98–111)
Creatinine, Ser: 0.61 mg/dL (ref 0.44–1.00)
GFR, Estimated: >60 mL/min (ref >60)
Glucose, Bld: 82 mg/dL (ref 70–99)
Potassium: 4.1 mmol/L (ref 3.5–5.1)
Sodium: 131 mmol/L — ABNORMAL LOW (ref 135–145)
Total Bilirubin: 0.6 mg/dL (ref 0.3–1.2)
Total Protein: 5.9 g/dL — ABNORMAL LOW (ref 6.5–8.1)

## 2020-03-12 LAB — TYPE AND SCREEN
ABO/RH(D): O NEG
Antibody Screen: NEGATIVE
Unit division: 0

## 2020-03-12 LAB — C-REACTIVE PROTEIN: CRP: 0.5 mg/dL (ref <1.0)

## 2020-03-12 LAB — FERRITIN: Ferritin: 21 ng/mL (ref 11–307)

## 2020-03-12 LAB — D-DIMER, QUANTITATIVE: D-Dimer, Quant: 0.75 ug/mL-FEU — ABNORMAL HIGH (ref 0.00–0.50)

## 2020-03-12 MED ORDER — BUDESONIDE 0.5 MG/2ML IN SUSP: 0.5000 mg | Freq: Two times a day (BID) | RESPIRATORY_TRACT | Status: DC

## 2020-03-12 MED ORDER — FLUTICASONE PROPIONATE HFA 110 MCG/ACT IN AERO
1.0000 | INHALATION_SPRAY | Freq: Two times a day (BID) | RESPIRATORY_TRACT | Status: DC
  Administered 2020-03-12 – 2020-03-20 (×16): 1 via RESPIRATORY_TRACT
  Filled 2020-03-12: qty 12

## 2020-03-12 MED ORDER — HYPROMELLOSE (GONIOSCOPIC) 2.5 % OP SOLN: 1.0000 [drp] | Freq: Four times a day (QID) | OPHTHALMIC | Status: DC | PRN

## 2020-03-12 MED ORDER — ACETAMINOPHEN 325 MG PO TABS
650.0000 mg | ORAL_TABLET | ORAL | Status: DC | PRN
  Administered 2020-03-12 – 2020-03-20 (×17): 650 mg via ORAL
  Filled 2020-03-12 (×17): qty 2

## 2020-03-12 MED ORDER — SODIUM CHLORIDE 0.9 % IV SOLN
100.0000 mg | Freq: Every day | INTRAVENOUS | Status: AC
  Administered 2020-03-13 – 2020-03-14 (×2): 100 mg via INTRAVENOUS
  Filled 2020-03-12 (×2): qty 20

## 2020-03-12 MED ORDER — LEVOTHYROXINE SODIUM 75 MCG PO TABS
75.0000 ug | ORAL_TABLET | ORAL | Status: DC
  Administered 2020-03-14 – 2020-03-15 (×2): 75 ug via ORAL
  Filled 2020-03-12 (×2): qty 1

## 2020-03-12 MED ORDER — LEVOTHYROXINE SODIUM 50 MCG PO TABS
50.0000 ug | ORAL_TABLET | ORAL | Status: DC
  Administered 2020-03-13 – 2020-03-20 (×6): 50 ug via ORAL
  Filled 2020-03-12 (×6): qty 1

## 2020-03-12 MED ORDER — POLYVINYL ALCOHOL 1.4 % OP SOLN
1.0000 [drp] | OPHTHALMIC | Status: DC | PRN
  Administered 2020-03-12: 1 [drp] via OPHTHALMIC
  Filled 2020-03-12: qty 15

## 2020-03-12 MED ORDER — SODIUM CHLORIDE 0.9 % IV SOLN
200.0000 mg | Freq: Once | INTRAVENOUS | Status: AC
  Administered 2020-03-12: 200 mg via INTRAVENOUS
  Filled 2020-03-12: qty 200

## 2020-03-12 MED ORDER — MESALAMINE 1.2 G PO TBEC
2.4000 g | DELAYED_RELEASE_TABLET | Freq: Every day | ORAL | Status: DC
  Administered 2020-03-13 – 2020-03-19 (×7): 2.4 g via ORAL
  Filled 2020-03-12 (×8): qty 2

## 2020-03-12 MED ORDER — VITAMIN D 25 MCG (1000 UNIT) PO TABS
1000.0000 [IU] | ORAL_TABLET | ORAL | Status: DC
  Administered 2020-03-14 – 2020-03-15 (×2): 1000 [IU] via ORAL
  Filled 2020-03-12 (×2): qty 1

## 2020-03-12 NOTE — ED Notes (Signed)
ED TO INPATIENT HANDOFF REPORT  Name/Age/Gender [name] 86 y.o. female  Code Status    Code Status Orders  (From admission, onward)         Start     Ordered   03/11/20 2003  Do not attempt resuscitation (DNR)  Continuous       Question Answer Comment  In the event of cardiac or respiratory ARREST Do not call a "code blue"   In the event of cardiac or respiratory ARREST Do not perform Intubation, CPR, defibrillation or ACLS   In the event of cardiac or respiratory ARREST Use medication by any route, position, wound care, and other measures to relive pain and suffering. May use oxygen, suction and manual treatment of airway obstruction as needed for comfort.   Comments Confirmed with pt      03/11/20 2002        Code Status History    Date Active Date Inactive Code Status Order ID Comments User Context   03/11/2020 2002 03/11/2020 2002 DNR [id number]  Jonnie Finner, DO ED   11/16/2019 2302 11/20/2019 1441 DNR [id number]  Toy Baker, MD ED   08/06/2019 0512 08/09/2019 2321 Full Code [id number]  Opyd, Ilene Qua, MD ED   Advance Care Planning Activity    Advance Directive Documentation   Flowsheet Row Most Recent Value  Type of Advance Directive Healthcare Power of Attorney, Living will  Pre-existing out of facility DNR order (yellow form or pink MOST form) -  "MOST" Form in Place? -      Home/SNF/Other Home  Chief Complaint GIB (gastrointestinal bleeding) [K92.2]  Level of Care/Admitting Diagnosis ED Disposition    ED Disposition Condition [place]: [facility] [[id number]]  Level of Care: Telemetry [5]  Admit to tele based on following criteria: Monitor for Ischemic changes  Covid Evaluation: Confirmed COVID Positive  Diagnosis: GIB (gastrointestinal bleeding) [300762]  Admitting Physician: Jonnie Finner [[id number]]  Attending Physician: Jonnie Finner [[id number]]       Medical History Past Medical History:  Diagnosis  Date  . ABDOMINAL PAIN, CHRONIC 05/28/2008  . ALLERGIC RHINITIS 09/21/2006  . ANEMIA-NOS 09/21/2006  . ASTHMA 09/21/2006  . ASYMPTOMATIC POSTMENOPAUSAL STATUS 05/28/2008  . Cataract   . CHEST PAIN, ATYPICAL 05/28/2008  . COPD (chronic obstructive pulmonary disease) ([place])   . COVID-19 virus infection 02/2020  . DDD (degenerative disc disease)   . DIVERTICULITIS, HX OF 09/21/2006  . GERD 09/21/2006  . HYPERCHOLESTEROLEMIA 06/05/2009  . HYPERTENSION 09/21/2006  . HYPOTHYROIDISM 09/21/2006  . Peptic ulcer disease   . Purpura ([place])   . RENAL INSUFFICIENCY 09/21/2006  . Shingles   . Small bowel obstruction ([place])   . Somatization disorder 04/25/2007  . UNSPECIFIED URINARY CALCULUS 05/28/2008  . UTI (urinary tract infection) 07/2019    Allergies Allergies  Allergen Reactions  . [place] Nut (Berthollefia [place]) Skin Test Anaphylaxis  . Fluconazole Shortness Of Breath  . Metronidazole Shortness Of Breath and Nausea And Vomiting  . Pepcid [Famotidine] Shortness Of Breath and Other (See Comments)    Dizziness  . Shellfish Allergy Anaphylaxis  . Macrobid [Nitrofurantoin]     REACTION: Syncope  . Pantoprazole Other (See Comments)    CHEST PAIN    IV Location/Drains/Wounds Patient Lines/Drains/Airways Status    Active Line/Drains/Airways    Name Placement date Placement time Site Days   Peripheral IV 03/11/20 Anterior;Left;Proximal Forearm 03/11/20  -  Forearm  1  Labs/Imaging Results for orders placed or performed during the hospital encounter of 03/11/20 (from the past 48 hour(s))  CBC with Differential/Platelet     Status: Abnormal   Collection Time: 03/11/20 12:59 PM  Result Value Ref Range   WBC 10.3 4.0 - 10.5 K/uL   RBC 2.51 (L) 3.87 - 5.11 MIL/uL   Hemoglobin 7.6 (L) 12.0 - 15.0 g/dL   HCT 24.1 (L) 36.0 - 46.0 %   MCV 96.0 80.0 - 100.0 fL   MCH 30.3 26.0 - 34.0 pg   MCHC 31.5 30.0 - 36.0 g/dL   RDW 14.7 11.5 - 15.5 %   Platelets 553 (H) 150 - 400 K/uL   nRBC 0.3  (H) 0.0 - 0.2 %   Neutrophils Relative % 52 %   Neutro Abs 5.2 1.7 - 7.7 K/uL   Lymphocytes Relative 26 %   Lymphs Abs 2.7 0.7 - 4.0 K/uL   Monocytes Relative 18 %   Monocytes Absolute 1.9 (H) 0.1 - 1.0 K/uL   Eosinophils Relative 0 %   Eosinophils Absolute 0.0 0.0 - 0.5 K/uL   Basophils Relative 0 %   Basophils Absolute 0.0 0.0 - 0.1 K/uL   Immature Granulocytes 4 %   Abs Immature Granulocytes 0.43 (H) 0.00 - 0.07 K/uL    Comment: Performed at [facility], [place] [address], [place], [place] [address]  Comprehensive metabolic panel     Status: Abnormal   Collection Time: 03/11/20 12:59 PM  Result Value Ref Range   Sodium 133 (L) 135 - 145 mmol/L   Potassium 4.0 3.5 - 5.1 mmol/L   Chloride 99 98 - 111 mmol/L   CO2 26 22 - 32 mmol/L   Glucose, Bld 94 70 - 99 mg/dL    Comment: Glucose reference range applies only to samples taken after fasting for at least 8 hours.   BUN 28 (H) 8 - 23 mg/dL   Creatinine, Ser 0.72 0.44 - 1.00 mg/dL   Calcium 9.2 8.9 - 10.3 mg/dL   Total Protein 6.4 (L) 6.5 - 8.1 g/dL   Albumin 3.3 (L) 3.5 - 5.0 g/dL   AST 25 15 - 41 U/L   ALT 18 0 - 44 U/L   Alkaline Phosphatase 40 38 - 126 U/L   Total Bilirubin 0.3 0.3 - 1.2 mg/dL   GFR, Estimated >60 >60 mL/min    Comment: (NOTE) Calculated using the CKD-EPI Creatinine Equation (2021)    Anion gap 8 5 - 15    Comment: Performed at [facility], [place] [address] [place], [place] [address]  POC occult blood, ED     Status: Abnormal   Collection Time: 03/11/20  2:26 PM  Result Value Ref Range   Fecal Occult Bld POSITIVE (A) NEGATIVE  Type and screen [place]     Status: None (Preliminary result)   Collection Time: 03/11/20  8:48 PM  Result Value Ref Range   ABO/RH(D) O NEG    Antibody Screen NEG    Sample Expiration 03/14/2020,2359    Unit Number [id number]    Blood Component Type RED CELLS,LR    Unit division 00    Status of Unit ISSUED     Transfusion Status OK TO TRANSFUSE    Crossmatch Result      Compatible Performed at [facility], [place] [address] [place], [place] [address]   Prepare RBC (crossmatch)     Status: None   Collection Time: 03/11/20  8:48  PM  Result Value Ref Range   Order Confirmation      ORDER PROCESSED BY BLOOD BANK Performed at [facility] [address], [place], [place] [address]    DG Chest Port 1 View  Result Date: 03/11/2020 CLINICAL DATA:  COVID patient. EXAM: PORTABLE CHEST 1 VIEW COMPARISON:  03/04/2020 FINDINGS: Normal heart size. Large hiatal hernia is again identified. Asymmetric elevation of right hemidiaphragm. No pleural effusion or edema. No airspace consolidation. IMPRESSION: 1. No acute cardiopulmonary abnormalities. 2. Large hiatal hernia. Electronically Signed   By: Kerby Moors M.D.   On: 03/11/2020 13:53    Pending Labs Unresulted Labs (From admission, onward)          Start     Ordered   03/12/20 0500  CBC with Differential/Platelet  Daily,   R      03/11/20 2002   03/12/20 0500  Comprehensive metabolic panel  Daily,   R      03/11/20 2002   03/12/20 0500  C-reactive protein  Daily,   R      03/11/20 2002   03/12/20 0500  D-dimer, quantitative (not at [facility])  Daily,   R      03/11/20 2002   03/12/20 0500  Ferritin  Daily,   R      03/11/20 2002   03/12/20 0500  Magnesium  Daily,   R      03/11/20 2002   03/12/20 0500  Phosphorus  Daily,   R      03/11/20 2002   03/11/20 1625  Ferritin  Add-on,   AD        03/11/20 1624   03/11/20 1625  Iron and TIBC  Add-on,   AD        03/11/20 1624          Vitals/Pain Today's Vitals   03/11/20 2341 03/12/20 0104 03/12/20 0140 03/12/20 0230  BP: (!) 161/64 (!) 150/71  (!) 152/76  Pulse: 71 65  62  Resp: 14 12  15   Temp: 98.2 F (36.8 C)   98.7 F (37.1 C)  TempSrc:    Oral  SpO2: 97% 98%  98%  Weight:      Height:      PainSc:   7      Isolation Precautions Airborne and  Contact precautions  Medications Medications  acetaminophen (TYLENOL) tablet 650 mg (650 mg Oral Given 03/12/20 0144)  guaiFENesin-dextromethorphan (ROBITUSSIN DM) 100-10 MG/5ML syrup 10 mL (10 mLs Oral Given 03/11/20 2044)  chlorpheniramine-HYDROcodone (TUSSIONEX) 10-8 MG/5ML suspension 5 mL (has no administration in time range)  ascorbic acid (VITAMIN C) tablet 500 mg (500 mg Oral Given 03/11/20 2044)  zinc sulfate capsule 220 mg (220 mg Oral Given 03/11/20 2044)  Ipratropium-Albuterol (COMBIVENT) respimat 1 puff (1 puff Inhalation Given 03/12/20 0105)  ondansetron (ZOFRAN) tablet 4 mg (has no administration in time range)    Or  ondansetron (ZOFRAN) injection 4 mg (has no administration in time range)  0.9 %  sodium chloride infusion (Manually program via Guardrails IV Fluids) ( Intravenous Stopped 03/12/20 0230)    Mobility walks

## 2020-03-12 NOTE — Progress Notes (Signed)
Patient refused to get up and walk. Stated that she didn't feel good. She stated to ask again tomorrow and she may try then.

## 2020-03-12 NOTE — Progress Notes (Signed)
PROGRESS NOTE  [name] PXT:[id number] DOB: 07/25/1934 DOA: 03/11/2020 PCP: Caren Macadam, MD   LOS: 0 days   Brief narrative:  [name] is a 86 y.o. female with medical history significant of asthma, Purpura, hypothyroidism presented to hospital with cough and fatigue for last 10 days.  He was diagnosed with Covid almost 10 days back and was sent home with isolation.  At that time he also had symptomatic anemia but did not wish to proceed with further evaluation.  He was advised to follow-up with GI as outpatient.  h patient continued to have cough fatigue and weakness her primary care provider advised her to come to the hospital.   In the ED, chest x-ray was noted to be clear.  She was found to have hemoglobin of 7.6 and FOBT was positive.  GI was consulted but patient denied further work-up.  Hospitalist team was consulted for admission to the hospital for Covid infection and anemia.  Assessment/Plan:  Principal Problem:   COVID-19 virus infection Active Problems:   Hypothyroidism   Essential hypertension   GIB (gastrointestinal bleeding)  COVID 19 infection Patient was first diagnosed with positive on 03/04/2020.  Unvaccinated patient.  I spoke with the patient regarding possible use of remdesivir the patient has not been doing well and has been admitted to the hospital.  She has agreed to it today.  Not on steroids so far.  Continue inhaler, antitussives.  Currently on 97% on room air.  Chest x-ray without infiltrate.  Tinea supportive care with antitussives.  GI bleed, fecal occult blood positive with acute on chronic symptomatic anemia Secondary to chronic active ulcerative colitis. Patient had endoscopy done in the past in 2015.  She did have flexible sigmoidoscopy 11/2019.  Patient was noted to have a proctosigmoid colitis at that time and was given mesalamine and Canasa suppositories for chronic active ulcerative colitis.  Patient received 1 unit of packed RBC. Has been  seen by GI on this admission. Doing well on mesalamine at home, will restart. Follow iron profile.  Hypothyroidism Resume synthroid.  Purpura     - continue outpt follow up  Generalized weakness, debility, deconditioning.  Will get PT evaluation. Lives alone. Has been having difficulty with ADLs.  DVT prophylaxis: SCDs Start: 03/11/20 2003  Code Status: DNR  Family Communication: I spoke with the patient's son and updated her about the clinical condition of the patient.   Status is: Observation  The patient will require care spanning > 2 midnights and should be moved to inpatient because: IV treatments appropriate due to intensity of illness or inability to take PO and Inpatient level of care appropriate due to severity of illness  Dispo: The patient is from: Home              Anticipated d/c is to: Home              Anticipated d/c date is: 2 days, check PT, check CBC in am, remdesivir in am.              Patient currently is not medically stable to d/c.   Difficult to place patient No  Consultants:  None  Procedures:  None  Anti-infectives:  . Remdesivir 2/3>  Anti-infectives (From admission, onward)   Start     Dose/Rate Route Frequency Ordered Stop   03/13/20 1000  remdesivir 100 mg in sodium chloride 0.9 % 100 mL IVPB       "Followed by" Linked Group  Details   100 mg 200 mL/hr over 30 Minutes Intravenous Daily 03/12/20 0949 03/17/20 0959   03/12/20 1100  remdesivir 200 mg in sodium chloride 0.9% 250 mL IVPB       "Followed by" Linked Group Details   200 mg 580 mL/hr over 30 Minutes Intravenous Once 03/12/20 0949       Subjective: Today, patient was seen and examined at bedside.  Patient complains of generalized weakness, complains of a burning of her eyes.,  Cough.  Denies chest pain, dyspnea  Objective: Vitals:   03/12/20 0401 03/12/20 0841  BP: (!) 162/64 (!) 127/54  Pulse: 60 72  Resp: 20 20  Temp: 97.9 F (36.6 C) 99.1 F (37.3 C)  SpO2: 98%  97%    Intake/Output Summary (Last 24 hours) at 03/12/2020 1102 Last data filed at 03/12/2020 0841 Gross per 24 hour  Intake 80 ml  Output -  Net 80 ml   Filed Weights   03/11/20 1255  Weight: 63.5 kg   Body mass index is 23.3 kg/m.   Physical Exam:  GENERAL: Patient is alert awake and communicative, not in obvious distress.  Thinly built, HENT: Mild pallor noted.. Pupils equally reactive to light. Oral mucosa is moist NECK: is supple, no gross swelling noted. CHEST:   Diminished breath sounds bilaterally, no wheezes or crackles.. CVS: S1 and S2 heard, no murmur. Regular rate and rhythm.  ABDOMEN: Soft, non-tender, bowel sounds are present. EXTREMITIES: No edema. CNS: Cranial nerves are intact. No focal motor deficits.  Moving all extremities. SKIN: warm and dry without rashes.  Data Review: I have personally reviewed the following laboratory data and studies,  CBC: Recent Labs  Lab 03/11/20 1259 03/12/20 0521  WBC 10.3 6.7  NEUTROABS 5.2 2.8  HGB 7.6* 9.3*  HCT 24.1* 29.0*  MCV 96.0 93.9  PLT 553* 532*   Basic Metabolic Panel: Recent Labs  Lab 03/11/20 1259 03/12/20 0521  NA 133* 131*  K 4.0 4.1  CL 99 101  CO2 26 21*  GLUCOSE 94 82  BUN 28* 19  CREATININE 0.72 0.61  CALCIUM 9.2 8.4*  MG  --  1.9  PHOS  --  3.5   Liver Function Tests: Recent Labs  Lab 03/11/20 1259 03/12/20 0521  AST 25 23  ALT 18 17  ALKPHOS 40 43  BILITOT 0.3 0.6  PROT 6.4* 5.9*  ALBUMIN 3.3* 3.2*   No results for input(s): LIPASE, AMYLASE in the last 168 hours. No results for input(s): AMMONIA in the last 168 hours. Cardiac Enzymes: No results for input(s): CKTOTAL, CKMB, CKMBINDEX, TROPONINI in the last 168 hours. BNP (last 3 results) No results for input(s): BNP in the last 8760 hours.  ProBNP (last 3 results) No results for input(s): PROBNP in the last 8760 hours.  CBG: No results for input(s): GLUCAP in the last 168 hours. No results found for this or any previous  visit (from the past 240 hour(s)).   Studies: DG Chest Port 1 View  Result Date: 03/11/2020 CLINICAL DATA:  COVID patient. EXAM: PORTABLE CHEST 1 VIEW COMPARISON:  03/04/2020 FINDINGS: Normal heart size. Large hiatal hernia is again identified. Asymmetric elevation of right hemidiaphragm. No pleural effusion or edema. No airspace consolidation. IMPRESSION: 1. No acute cardiopulmonary abnormalities. 2. Large hiatal hernia. Electronically Signed   By: Kerby Moors M.D.   On: 03/11/2020 13:53      Flora Lipps, MD  [facility] 03/12/2020  If 7PM-7AM, please contact night-coverage

## 2020-03-13 ENCOUNTER — Observation Stay (HOSPITAL_BASED_OUTPATIENT_CLINIC_OR_DEPARTMENT_OTHER): Payer: Medicare Other

## 2020-03-13 DIAGNOSIS — K922 Gastrointestinal hemorrhage, unspecified: Secondary | ICD-10-CM | POA: Diagnosis not present

## 2020-03-13 DIAGNOSIS — R7989 Other specified abnormal findings of blood chemistry: Secondary | ICD-10-CM | POA: Diagnosis not present

## 2020-03-13 DIAGNOSIS — U071 COVID-19: Secondary | ICD-10-CM | POA: Diagnosis not present

## 2020-03-13 DIAGNOSIS — I10 Essential (primary) hypertension: Secondary | ICD-10-CM | POA: Diagnosis not present

## 2020-03-13 DIAGNOSIS — E039 Hypothyroidism, unspecified: Secondary | ICD-10-CM | POA: Diagnosis not present

## 2020-03-13 LAB — CBC WITH DIFFERENTIAL/PLATELET
Abs Immature Granulocytes: 0.2 10*3/uL — ABNORMAL HIGH (ref 0.00–0.07)
Basophils Absolute: 0.1 10*3/uL (ref 0.0–0.1)
Basophils Relative: 1 %
Eosinophils Absolute: 0 10*3/uL (ref 0.0–0.5)
Eosinophils Relative: 0 %
HCT: 28.9 % — ABNORMAL LOW (ref 36.0–46.0)
Hemoglobin: 9.5 g/dL — ABNORMAL LOW (ref 12.0–15.0)
Immature Granulocytes: 3 %
Lymphocytes Relative: 25 %
Lymphs Abs: 2 10*3/uL (ref 0.7–4.0)
MCH: 30.4 pg (ref 26.0–34.0)
MCHC: 32.9 g/dL (ref 30.0–36.0)
MCV: 92.3 fL (ref 80.0–100.0)
Monocytes Absolute: 1.3 10*3/uL — ABNORMAL HIGH (ref 0.1–1.0)
Monocytes Relative: 16 %
Neutro Abs: 4.3 10*3/uL (ref 1.7–7.7)
Neutrophils Relative %: 55 %
Platelets: 485 10*3/uL — ABNORMAL HIGH (ref 150–400)
RBC: 3.13 MIL/uL — ABNORMAL LOW (ref 3.87–5.11)
RDW: 15.2 % (ref 11.5–15.5)
WBC: 7.8 10*3/uL (ref 4.0–10.5)
nRBC: 0 % (ref 0.0–0.2)

## 2020-03-13 LAB — COMPREHENSIVE METABOLIC PANEL
ALT: 16 U/L (ref 0–44)
AST: 19 U/L (ref 15–41)
Albumin: 2.9 g/dL — ABNORMAL LOW (ref 3.5–5.0)
Alkaline Phosphatase: 40 U/L (ref 38–126)
Anion gap: 8 (ref 5–15)
BUN: 23 mg/dL (ref 8–23)
CO2: 24 mmol/L (ref 22–32)
Calcium: 8.7 mg/dL — ABNORMAL LOW (ref 8.9–10.3)
Chloride: 101 mmol/L (ref 98–111)
Creatinine, Ser: 0.64 mg/dL (ref 0.44–1.00)
GFR, Estimated: >60 mL/min (ref >60)
Glucose, Bld: 81 mg/dL (ref 70–99)
Potassium: 4.2 mmol/L (ref 3.5–5.1)
Sodium: 133 mmol/L — ABNORMAL LOW (ref 135–145)
Total Bilirubin: 0.3 mg/dL (ref 0.3–1.2)
Total Protein: 5.5 g/dL — ABNORMAL LOW (ref 6.5–8.1)

## 2020-03-13 LAB — MAGNESIUM: Magnesium: 2.1 mg/dL (ref 1.7–2.4)

## 2020-03-13 LAB — FERRITIN: Ferritin: 21 ng/mL (ref 11–307)

## 2020-03-13 LAB — C-REACTIVE PROTEIN: CRP: 0.7 mg/dL (ref <1.0)

## 2020-03-13 LAB — D-DIMER, QUANTITATIVE: D-Dimer, Quant: 1.1 ug/mL-FEU — ABNORMAL HIGH (ref 0.00–0.50)

## 2020-03-13 LAB — PHOSPHORUS: Phosphorus: 2.8 mg/dL (ref 2.5–4.6)

## 2020-03-13 MED ORDER — HYDROCODONE-ACETAMINOPHEN 5-325 MG PO TABS
1.0000 | ORAL_TABLET | Freq: Four times a day (QID) | ORAL | Status: DC | PRN
  Administered 2020-03-13 – 2020-03-18 (×3): 1 via ORAL
  Filled 2020-03-13 (×4): qty 1

## 2020-03-13 MED ORDER — TRAMADOL HCL 50 MG PO TABS
50.0000 mg | ORAL_TABLET | Freq: Four times a day (QID) | ORAL | Status: DC | PRN
  Administered 2020-03-13 – 2020-03-19 (×7): 50 mg via ORAL
  Filled 2020-03-13 (×8): qty 1

## 2020-03-13 NOTE — Progress Notes (Signed)
PROGRESS NOTE  [name] [id number]:[id number] DOB: April 03, 1934 DOA: 03/11/2020 PCP: Caren Macadam, MD   LOS: 0 days   Brief narrative:  [name] is a 86 y.o. female with medical history significant of asthma, purpura, hypothyroidism presented to hospital with cough and fatigue for last 10 days.  Patient was diagnosed with Covid infection almost 10 days back and was sent home with recommendation for isolation.  At that time, she also had symptomatic anemia but did not wish to proceed with further evaluation.  She was advised to follow-up with GI as outpatient.  Patient continued to have cough, fatigue and weakness her primary care provider advised her to come to the hospital.  In the ED, chest x-ray was noted to be clear.  She was found to have hemoglobin of 7.6 and FOBT was positive.  GI was consulted but patient denied further work-up.  Hospitalist team was consulted for admission to the hospital for Covid infection and anemia.  Assessment/Plan:  Principal Problem:   COVID-19 virus infection Active Problems:   Hypothyroidism   Essential hypertension   GIB (gastrointestinal bleeding)  COVID 19 infection Patient was first diagnosed with positive on 03/04/2020.  Unvaccinated patient.   Continue inhaler, antitussives.   Chest x-ray without infiltrate.  Continue supportive care with antitussives.  Has been started on remdesivir after discussing with the patient since patient was admitted with multiple complaints.  Trend inflammatory bio-markers.  COVID-19 Labs  Recent Labs    03/12/20 0521 03/13/20 0339  DDIMER 0.75* 1.10*  FERRITIN 21 21  CRP 0.5 0.7    Lab Results  Component Value Date   SARSCOV2NAA NEGATIVE 11/16/2019   [place] NEGATIVE 08/06/2019   SARSCOV2NAA NOT DETECTED 04/08/2019    Generalized weakness, fatigue pain on the back and neck.   takes Tylenol at home.  I have offered her tramadol/Vicodin for pain.  Will need physical therapy evaluation.  Generalized  weakness could be secondary to Covid or anemia.  Left leg pain. Will get ultrasound of the leg.   GI bleed, fecal occult blood positive with acute on chronic symptomatic anemia Secondary to chronic active ulcerative colitis. History of flexible sigmoidoscopy 11/2019.  Patient was noted to have a proctosigmoid colitis at that time and was given mesalamine and Canasa suppositories for chronic active ulcerative colitis.  Patient received 1 unit of packed RBC this admission. Has been seen by GI on this admission. Doing well on mesalamine at home.  Mesalamine has been restarted while in the hospital.  Hemoglobin stable at 9.5 today  CBC Latest Ref Rng & Units 03/13/2020 03/12/2020 03/11/2020  WBC 4.0 - 10.5 K/uL 7.8 6.7 10.3  Hemoglobin 12.0 - 15.0 g/dL 9.5(L) 9.3(L) 7.6(L)  Hematocrit 36.0 - 46.0 % 28.9(L) 29.0(L) 24.1(L)  Platelets 150 - 400 K/uL 485(H) 472(H) 553(H)    Hypothyroidism Continue synthroid.  Purpura    continue outpt follow up.  Generalized weakness, debility, deconditioning.  Pending PT evaluation. Lives alone. Has been having difficulty with ADLs as per the family. Used to be on prednisone in the past but hasn't been taking here in the hospital.  Goals of care patient with multiple symptoms fatigue, weakness and reluctance with management plan.  She expressed palliative care with the nursing staff.  We will get palliative care consultation.  Patient is DNR.  DVT prophylaxis: SCDs Start: 03/11/20 2003  Code Status: DNR  Family Communication:  I again spoke with the patient's son and updated her about the clinical condition of  the patient.   Status is: Observation  The patient will require care spanning > 2 midnights and should be moved to inpatient because: IV treatments appropriate due to intensity of illness or inability to take PO and Inpatient level of care appropriate due to severity of illness  Dispo: The patient is from: Home              Anticipated d/c is to:  Likely home with home PT/rehabilitation.  PT pending. Will get palliative care consult due to multiple symptoms, goals of care.              Anticipated d/c date is: 2 days, check PT, continue remdesivir, monitor hemoglobin              Patient currently is not medically stable to d/c.   Difficult to place patient No  Consultants:  None  Procedures:  PRBC transfusion 1 unit  Anti-infectives:  . Remdesivir 2/3>  Anti-infectives (From admission, onward)   Start     Dose/Rate Route Frequency Ordered Stop   03/13/20 1000  remdesivir 100 mg in sodium chloride 0.9 % 100 mL IVPB       "Followed by" Linked Group Details   100 mg 200 mL/hr over 30 Minutes Intravenous Daily 03/12/20 0949 03/17/20 0959   03/12/20 1100  remdesivir 200 mg in sodium chloride 0.9% 250 mL IVPB       "Followed by" Linked Group Details   200 mg 580 mL/hr over 30 Minutes Intravenous Once 03/12/20 0949 03/12/20 1133     Subjective: Today, patient was seen and examined at bedside.  Patient complains of multiple areas of pain including neck back, lower extremities.  She also complained of left leg pain on palpation.  Complains of generalized weakness, fatigue. Complain of mild cough with green cough.   Objective: Vitals:   03/12/20 2026 03/13/20 0434  BP: 137/74 (!) 144/66  Pulse: 68 65  Resp:    Temp: 98.9 F (37.2 C) 98.7 F (37.1 C)  SpO2: 97% 95%    Intake/Output Summary (Last 24 hours) at 03/13/2020 1359 Last data filed at 03/12/2020 1600 Gross per 24 hour  Intake 100 ml  Output --  Net 100 ml   Filed Weights   03/11/20 1255  Weight: 63.5 kg   Body mass index is 23.3 kg/m.   Physical Exam: GENERAL: Patient is alert awake and communicative, anxious, thinly built, frail, chronically ill female HENT: Mild pallor noted.. Pupils equally reactive to light. Oral mucosa is moist NECK: is supple, no gross swelling noted. CHEST:   Diminished breath sounds bilaterally, no wheezes or crackles. CVS: S1  and S2 heard, no murmur. Regular rate and rhythm.  ABDOMEN: Soft, non-tender, bowel sounds are present. EXTREMITIES: Tenderness on palpation of the left lower extremity CNS: Cranial nerves are intact.  Moving all extremities. SKIN: warm and dry without rashes.  Data Review: I have personally reviewed the following laboratory data and studies,  CBC: Recent Labs  Lab 03/11/20 1259 03/12/20 0521 03/13/20 0339  WBC 10.3 6.7 7.8  NEUTROABS 5.2 2.8 4.3  HGB 7.6* 9.3* 9.5*  HCT 24.1* 29.0* 28.9*  MCV 96.0 93.9 92.3  PLT 553* 472* 361*   Basic Metabolic Panel: Recent Labs  Lab 03/11/20 1259 03/12/20 0521 03/13/20 0339  NA 133* 131* 133*  K 4.0 4.1 4.2  CL 99 101 101  CO2 26 21* 24  GLUCOSE 94 82 81  BUN 28* 19 23  CREATININE 0.72 0.61 0.64  CALCIUM 9.2 8.4* 8.7*  MG  --  1.9 2.1  PHOS  --  3.5 2.8   Liver Function Tests: Recent Labs  Lab 03/11/20 1259 03/12/20 0521 03/13/20 0339  AST 25 23 19   ALT 18 17 16   ALKPHOS 40 43 40  BILITOT 0.3 0.6 0.3  PROT 6.4* 5.9* 5.5*  ALBUMIN 3.3* 3.2* 2.9*   No results for input(s): LIPASE, AMYLASE in the last 168 hours. No results for input(s): AMMONIA in the last 168 hours. Cardiac Enzymes: No results for input(s): CKTOTAL, CKMB, CKMBINDEX, TROPONINI in the last 168 hours. BNP (last 3 results) No results for input(s): BNP in the last 8760 hours.  ProBNP (last 3 results) No results for input(s): PROBNP in the last 8760 hours.  CBG: No results for input(s): GLUCAP in the last 168 hours. No results found for this or any previous visit (from the past 240 hour(s)).   Studies: VAS [place] LOWER EXTREMITY VENOUS (DVT)  Result Date: 03/13/2020  Lower Venous DVT Study Indications: Elevated Ddimer.  Risk Factors: COVID 19 positive. Comparison Study: No prior studies. Performing Technologist: Oliver Hum RVT  Examination Guidelines: A complete evaluation includes B-mode imaging, spectral Doppler, color Doppler, and power Doppler as  needed of all accessible portions of each vessel. Bilateral testing is considered an integral part of a complete examination. Limited examinations for reoccurring indications may be performed as noted. The reflux portion of the exam is performed with the patient in reverse Trendelenburg.  +---------+---------------+---------+-----------+----------+--------------+ RIGHT    CompressibilityPhasicitySpontaneityPropertiesThrombus Aging +---------+---------------+---------+-----------+----------+--------------+ CFV      Full           Yes      Yes                                 +---------+---------------+---------+-----------+----------+--------------+ SFJ      Full                                                        +---------+---------------+---------+-----------+----------+--------------+ FV Prox  Full                                                        +---------+---------------+---------+-----------+----------+--------------+ FV Mid   Full                                                        +---------+---------------+---------+-----------+----------+--------------+ FV DistalFull                                                        +---------+---------------+---------+-----------+----------+--------------+ PFV      Full                                                        +---------+---------------+---------+-----------+----------+--------------+  POP      Full           Yes      Yes                                 +---------+---------------+---------+-----------+----------+--------------+ PTV      Full                                                        +---------+---------------+---------+-----------+----------+--------------+ PERO     Full                                                        +---------+---------------+---------+-----------+----------+--------------+    +---------+---------------+---------+-----------+----------+--------------+ LEFT     CompressibilityPhasicitySpontaneityPropertiesThrombus Aging +---------+---------------+---------+-----------+----------+--------------+ CFV      Full           Yes      Yes                                 +---------+---------------+---------+-----------+----------+--------------+ SFJ      Full                                                        +---------+---------------+---------+-----------+----------+--------------+ FV Prox  Full                                                        +---------+---------------+---------+-----------+----------+--------------+ FV Mid   Full                                                        +---------+---------------+---------+-----------+----------+--------------+ FV DistalFull                                                        +---------+---------------+---------+-----------+----------+--------------+ PFV      Full                                                        +---------+---------------+---------+-----------+----------+--------------+ POP      Full           Yes      Yes                                 +---------+---------------+---------+-----------+----------+--------------+  PTV      Full                                                        +---------+---------------+---------+-----------+----------+--------------+ PERO     Full                                                        +---------+---------------+---------+-----------+----------+--------------+     Summary: RIGHT: - There is no evidence of deep vein thrombosis in the lower extremity.  - No cystic structure found in the popliteal fossa.  LEFT: - There is no evidence of deep vein thrombosis in the lower extremity.  - No cystic structure found in the popliteal fossa.  *See table(s) above for measurements and observations.    Preliminary        Flora Lipps, MD  [facility] 03/13/2020  If 7PM-7AM, please contact night-coverage

## 2020-03-13 NOTE — Progress Notes (Signed)
Bilateral lower extremity venous duplex has been completed. Preliminary results can be found in CV Proc through chart review.   03/13/20 11:12 AM [name] RVT

## 2020-03-13 NOTE — Progress Notes (Signed)
PT Cancellation Note  Patient Details Name: [name] MRN: [id number] DOB: 17-May-1934   Cancelled Treatment:    Reason Eval/Treat Not Completed: Pain limiting ability to participate   Claretha Cooper 03/13/2020, 12:19 PM Tresa Endo PT Acute Rehabilitation Services Pager [phone] Office [phone]

## 2020-03-13 NOTE — Progress Notes (Signed)
During intentional rounding patient complained of chest bone pain that was radiating from her back. Asked charge nurse Mollie Germany, RN to come assess patient with me. Paged Dr. Louanne Belton via Shea Evans and completed EKG per MD orders. Copy of EKG in shadow chart. Administered PRN dose of Norco 5-325 MG at 1348.  Will continue to monitor patient.

## 2020-03-14 DIAGNOSIS — Z8052 Family history of malignant neoplasm of bladder: Secondary | ICD-10-CM | POA: Diagnosis not present

## 2020-03-14 DIAGNOSIS — E039 Hypothyroidism, unspecified: Secondary | ICD-10-CM | POA: Diagnosis not present

## 2020-03-14 DIAGNOSIS — Z8261 Family history of arthritis: Secondary | ICD-10-CM | POA: Diagnosis not present

## 2020-03-14 DIAGNOSIS — K5190 Ulcerative colitis, unspecified, without complications: Secondary | ICD-10-CM | POA: Diagnosis present

## 2020-03-14 DIAGNOSIS — M6281 Muscle weakness (generalized): Secondary | ICD-10-CM | POA: Diagnosis not present

## 2020-03-14 DIAGNOSIS — J449 Chronic obstructive pulmonary disease, unspecified: Secondary | ICD-10-CM | POA: Diagnosis not present

## 2020-03-14 DIAGNOSIS — U071 COVID-19: Secondary | ICD-10-CM | POA: Diagnosis not present

## 2020-03-14 DIAGNOSIS — Z7189 Other specified counseling: Secondary | ICD-10-CM

## 2020-03-14 DIAGNOSIS — D649 Anemia, unspecified: Secondary | ICD-10-CM | POA: Diagnosis not present

## 2020-03-14 DIAGNOSIS — J309 Allergic rhinitis, unspecified: Secondary | ICD-10-CM | POA: Diagnosis not present

## 2020-03-14 DIAGNOSIS — Z515 Encounter for palliative care: Secondary | ICD-10-CM

## 2020-03-14 DIAGNOSIS — I10 Essential (primary) hypertension: Secondary | ICD-10-CM | POA: Diagnosis not present

## 2020-03-14 DIAGNOSIS — K51311 Ulcerative (chronic) rectosigmoiditis with rectal bleeding: Secondary | ICD-10-CM | POA: Diagnosis not present

## 2020-03-14 DIAGNOSIS — D692 Other nonthrombocytopenic purpura: Secondary | ICD-10-CM | POA: Diagnosis present

## 2020-03-14 DIAGNOSIS — Z8279 Family history of other congenital malformations, deformations and chromosomal abnormalities: Secondary | ICD-10-CM | POA: Diagnosis not present

## 2020-03-14 DIAGNOSIS — Z79890 Hormone replacement therapy (postmenopausal): Secondary | ICD-10-CM | POA: Diagnosis not present

## 2020-03-14 DIAGNOSIS — D500 Iron deficiency anemia secondary to blood loss (chronic): Secondary | ICD-10-CM | POA: Diagnosis present

## 2020-03-14 DIAGNOSIS — R531 Weakness: Secondary | ICD-10-CM | POA: Diagnosis present

## 2020-03-14 DIAGNOSIS — E871 Hypo-osmolality and hyponatremia: Secondary | ICD-10-CM | POA: Diagnosis present

## 2020-03-14 DIAGNOSIS — M810 Age-related osteoporosis without current pathological fracture: Secondary | ICD-10-CM | POA: Diagnosis not present

## 2020-03-14 DIAGNOSIS — K922 Gastrointestinal hemorrhage, unspecified: Secondary | ICD-10-CM | POA: Diagnosis not present

## 2020-03-14 DIAGNOSIS — Z800 Family history of malignant neoplasm of digestive organs: Secondary | ICD-10-CM | POA: Diagnosis not present

## 2020-03-14 DIAGNOSIS — Z66 Do not resuscitate: Secondary | ICD-10-CM | POA: Diagnosis not present

## 2020-03-14 DIAGNOSIS — M6259 Muscle wasting and atrophy, not elsewhere classified, multiple sites: Secondary | ICD-10-CM | POA: Diagnosis not present

## 2020-03-14 DIAGNOSIS — Z8249 Family history of ischemic heart disease and other diseases of the circulatory system: Secondary | ICD-10-CM | POA: Diagnosis not present

## 2020-03-14 DIAGNOSIS — E7800 Pure hypercholesterolemia, unspecified: Secondary | ICD-10-CM | POA: Diagnosis present

## 2020-03-14 DIAGNOSIS — Z8271 Family history of polycystic kidney: Secondary | ICD-10-CM | POA: Diagnosis not present

## 2020-03-14 DIAGNOSIS — R5381 Other malaise: Secondary | ICD-10-CM | POA: Diagnosis present

## 2020-03-14 DIAGNOSIS — K5790 Diverticulosis of intestine, part unspecified, without perforation or abscess without bleeding: Secondary | ICD-10-CM | POA: Diagnosis present

## 2020-03-14 DIAGNOSIS — Z79899 Other long term (current) drug therapy: Secondary | ICD-10-CM | POA: Diagnosis not present

## 2020-03-14 DIAGNOSIS — Z86711 Personal history of pulmonary embolism: Secondary | ICD-10-CM | POA: Diagnosis not present

## 2020-03-14 DIAGNOSIS — K219 Gastro-esophageal reflux disease without esophagitis: Secondary | ICD-10-CM | POA: Diagnosis not present

## 2020-03-14 DIAGNOSIS — Z741 Need for assistance with personal care: Secondary | ICD-10-CM | POA: Diagnosis not present

## 2020-03-14 DIAGNOSIS — E876 Hypokalemia: Secondary | ICD-10-CM | POA: Diagnosis not present

## 2020-03-14 LAB — CBC WITH DIFFERENTIAL/PLATELET
Abs Immature Granulocytes: 0.14 10*3/uL — ABNORMAL HIGH (ref 0.00–0.07)
Basophils Absolute: 0.1 10*3/uL (ref 0.0–0.1)
Basophils Relative: 1 %
Eosinophils Absolute: 0 10*3/uL (ref 0.0–0.5)
Eosinophils Relative: 0 %
HCT: 30.4 % — ABNORMAL LOW (ref 36.0–46.0)
Hemoglobin: 10.1 g/dL — ABNORMAL LOW (ref 12.0–15.0)
Immature Granulocytes: 1 %
Lymphocytes Relative: 13 %
Lymphs Abs: 1.5 10*3/uL (ref 0.7–4.0)
MCH: 31.1 pg (ref 26.0–34.0)
MCHC: 33.2 g/dL (ref 30.0–36.0)
MCV: 93.5 fL (ref 80.0–100.0)
Monocytes Absolute: 1.5 10*3/uL — ABNORMAL HIGH (ref 0.1–1.0)
Monocytes Relative: 14 %
Neutro Abs: 7.8 10*3/uL — ABNORMAL HIGH (ref 1.7–7.7)
Neutrophils Relative %: 71 %
Platelets: 465 10*3/uL — ABNORMAL HIGH (ref 150–400)
RBC: 3.25 MIL/uL — ABNORMAL LOW (ref 3.87–5.11)
RDW: 14.9 % (ref 11.5–15.5)
WBC: 11 10*3/uL — ABNORMAL HIGH (ref 4.0–10.5)
nRBC: 0 % (ref 0.0–0.2)

## 2020-03-14 LAB — C-REACTIVE PROTEIN: CRP: 4.6 mg/dL — ABNORMAL HIGH (ref <1.0)

## 2020-03-14 LAB — COMPREHENSIVE METABOLIC PANEL
ALT: 14 U/L (ref 0–44)
AST: 21 U/L (ref 15–41)
Albumin: 3 g/dL — ABNORMAL LOW (ref 3.5–5.0)
Alkaline Phosphatase: 43 U/L (ref 38–126)
Anion gap: 5 (ref 5–15)
BUN: 24 mg/dL — ABNORMAL HIGH (ref 8–23)
CO2: 21 mmol/L — ABNORMAL LOW (ref 22–32)
Calcium: 8.3 mg/dL — ABNORMAL LOW (ref 8.9–10.3)
Chloride: 100 mmol/L (ref 98–111)
Creatinine, Ser: 0.54 mg/dL (ref 0.44–1.00)
GFR, Estimated: >60 mL/min (ref >60)
Glucose, Bld: 88 mg/dL (ref 70–99)
Potassium: 3.8 mmol/L (ref 3.5–5.1)
Sodium: 126 mmol/L — ABNORMAL LOW (ref 135–145)
Total Bilirubin: 0.6 mg/dL (ref 0.3–1.2)
Total Protein: 5.6 g/dL — ABNORMAL LOW (ref 6.5–8.1)

## 2020-03-14 LAB — PHOSPHORUS: Phosphorus: 2.8 mg/dL (ref 2.5–4.6)

## 2020-03-14 LAB — D-DIMER, QUANTITATIVE: D-Dimer, Quant: 1.17 ug/mL-FEU — ABNORMAL HIGH (ref 0.00–0.50)

## 2020-03-14 LAB — MAGNESIUM: Magnesium: 2.1 mg/dL (ref 1.7–2.4)

## 2020-03-14 LAB — FERRITIN: Ferritin: 27 ng/mL (ref 11–307)

## 2020-03-14 MED ORDER — SODIUM CHLORIDE 0.9 % IV SOLN: INTRAVENOUS | Status: DC

## 2020-03-14 MED ORDER — ENOXAPARIN SODIUM 40 MG/0.4ML ~~LOC~~ SOLN
40.0000 mg | Freq: Every day | SUBCUTANEOUS | Status: DC
  Administered 2020-03-14 – 2020-03-20 (×7): 40 mg via SUBCUTANEOUS
  Filled 2020-03-14 (×7): qty 0.4

## 2020-03-14 NOTE — Progress Notes (Signed)
PT Cancellation Note  Patient Details Name: [name] MRN: [id number] DOB: Apr 19, 1934   Cancelled Treatment:    Reason Eval/Treat Not Completed: Pain limiting ability to participate; attempted to encourage pt at least for in bed mobility, but she reports did not sleep, had difficult day yesterday and a little better with pain today and worried about exacerbating pain.  She seems very discouraged by her situation and how she feels and even mentioned Hospice.  I encouraged her she is maintaining well without supplemental O2, that her previous arthritic pain can be exacerbated by her viral infection as well as prolonged bedrest.  She continued to feel hopeless.  Encouraged call to family and stated PT would try again.   [name] 03/14/2020, 12:11 PM  [name], PT Acute Rehabilitation Services Pager:[phone] Office:[phone] 03/14/2020

## 2020-03-14 NOTE — Progress Notes (Addendum)
PROGRESS NOTE  LALISA KIEHN YTK:[id number] DOB: 24-Aug-1934 DOA: 03/11/2020 PCP: Caren Macadam, MD  HPI/Recap of past 24 hours: [name]. Divelbiss is an 86 year old female with medical history significant for asthma, purpura, hypothyroidism, who was admitted to the hospital for cough and fatigue for past 10 days she was diagnosed with Covid infection she was then discharged to isolate at home she was however readmitted because patient continued to feel cough fatigue and weakness and was already readmitted and noted to have a low hemoglobin of 7.6 which is positive for Hemoccult.  Subjective: March 14, 2020: Patient seen and examined at bedside she still feeling very poorly she is complaining of back pain queen weakness that suddenly comes upon her.  She could not participate in physical therapy evaluation yesterday  Assessment/Plan: Principal Problem:   COVID-19 virus infection Active Problems:   Hypothyroidism   Essential hypertension   GIB (gastrointestinal bleeding)  1 COVID-19 infection.  Patient was diagnosed as + March 04, 2020.  She is unvaccinated.  Her chest x-ray on admission was clear without infiltrate.  She has been started on remdesivir.  Her inflammatory markers are still elevated patient is on SCD for DVT prophylaxis .I will start her on Lovenox to reduce the risk of thromboembolism event We will continue to monitor  2.  Generalized weakness fatigue back and neck pain.  Generalized weakness likely due to her anemia and Covid infection.  She says she takes Tylenol at home yesterday was so bad she was given Vicodin but she said it made her really loopy and she does not want to take that she wants to deal with it and take her Tylenol like she usually does at home.  Physical therapy attempted to evaluate her yesterday but could not because she was feeling so weak and in pain   3.  GI bleed Hemoccult was positive with acute on chronic symptomatic anemia likely secondary to  chronic active ulcerative colitis.  Her last flex sig was October 2021.  Her hemoglobin was low and she received 1 unit of packed RBC and GI was consulted.  She is currently doing well denies any abdominal pain today.  Her hemoglobin is up to 10.1 today.  4.  Hypothyroidism continue Synthroid  5.  Purpura not active continue outpatient follow-up and management  6.  Chronic active ulcerative colitis.  Patient had a endoscopy that was done in 2015 and she had a flex sig in October 2021 and it showed proctosigmoid colitis and was started on mesalamine and  [place] suppositories when she is doing better  7.  Hyponatremia of 126, this may be contributing to her malaise and weakness.  We will start patient on IV hydration with normal saline  Code Status: DNR  Severity of Illness: The appropriate patient status for this patient is INPATIENT. Inpatient status is judged to be reasonable and necessary in order to provide the required intensity of service to ensure the patient's safety. The patient's presenting symptoms, physical exam findings, and initial radiographic and laboratory data in the context of their chronic comorbidities is felt to place them at high risk for further clinical deterioration. Furthermore, it is not anticipated that the patient will be medically stable for discharge from the hospital within 2 midnights of admission. The following factors support the patient status of inpatient.   " The patient's presenting symptoms include fatigue " The worrisome physical exam findings include fatigue unable to but begin physical therapy. " The initial radiographic and  laboratory data are worrisome because of COVID-19 infection. " The chronic co-morbidities include ulcerative colitis.  Safe to discharge at this time patient lives alone we will change her to inpatient   * I certify that at the point of admission it is my clinical judgment that the patient will require inpatient hospital care  spanning beyond 2 midnights from the point of admission due to high intensity of service, high risk for further deterioration and high frequency of surveillance required.*    Family Communication: None today  Disposition Plan: Change to inpatient Home in 1 to 2 days   Consultants:  GI  Procedures:  None  Antimicrobials:  None  DVT prophylaxis: SCD   Objective: Vitals:   03/13/20 0434 03/13/20 1513 03/13/20 2131 03/14/20 0453  BP: (!) 144/66 (!) 146/75 90/75 117/65  Pulse: 65 73 78 74  Resp:  12 16 16   Temp: 98.7 F (37.1 C) 98.9 F (37.2 C) 98.9 F (37.2 C) 99 F (37.2 C)  TempSrc: Oral Oral Oral Oral  SpO2: 95% 98% 97% 97%  Weight:      Height:        Intake/Output Summary (Last 24 hours) at 03/14/2020 0934 Last data filed at 03/14/2020 0600 Gross per 24 hour  Intake 293 ml  Output 1050 ml  Net -757 ml   Filed Weights   03/11/20 1255  Weight: 63.5 kg   Body mass index is 23.3 kg/m.  Exam:  . General: 86 y.o. year-old female well developed well nourished in no acute distress.  Alert and oriented x3. . Cardiovascular: Regular rate and rhythm with no rubs or gallops.  No thyromegaly or JVD noted.   Marland Kitchen Respiratory: Clear to auscultation with no wheezes or rales. Good inspiratory effort. . Abdomen: Soft nontender nondistended with normal bowel sounds x4 quadrants. . Musculoskeletal: No lower extremity edema. 2/4 pulses in all 4 extremities. . Skin: No ulcerative lesions noted or rashes, . Psychiatry: Mood is appropriate for condition and setting    Data Reviewed: CBC: Recent Labs  Lab 03/11/20 1259 03/12/20 0521 03/13/20 0339 03/14/20 0416  WBC 10.3 6.7 7.8 11.0*  NEUTROABS 5.2 2.8 4.3 7.8*  HGB 7.6* 9.3* 9.5* 10.1*  HCT 24.1* 29.0* 28.9* 30.4*  MCV 96.0 93.9 92.3 93.5  PLT 553* 472* 485* 782*   Basic Metabolic Panel: Recent Labs  Lab 03/11/20 1259 03/12/20 0521 03/13/20 0339 03/14/20 0416  NA 133* 131* 133* 126*  K 4.0 4.1 4.2 3.8  CL  99 101 101 100  CO2 26 21* 24 21*  GLUCOSE 94 82 81 88  BUN 28* 19 23 24*  CREATININE 0.72 0.61 0.64 0.54  CALCIUM 9.2 8.4* 8.7* 8.3*  MG  --  1.9 2.1 2.1  PHOS  --  3.5 2.8 2.8   GFR: Estimated Creatinine Clearance: 47.9 mL/min (by C-G formula based on SCr of 0.54 mg/dL). Liver Function Tests: Recent Labs  Lab 03/11/20 1259 03/12/20 0521 03/13/20 0339 03/14/20 0416  AST 25 23 19 21   ALT 18 17 16 14   ALKPHOS 40 43 40 43  BILITOT 0.3 0.6 0.3 0.6  PROT 6.4* 5.9* 5.5* 5.6*  ALBUMIN 3.3* 3.2* 2.9* 3.0*   No results for input(s): LIPASE, AMYLASE in the last 168 hours. No results for input(s): AMMONIA in the last 168 hours. Coagulation Profile: No results for input(s): INR, PROTIME in the last 168 hours. Cardiac Enzymes: No results for input(s): CKTOTAL, CKMB, CKMBINDEX, TROPONINI in the last 168 hours. BNP (last 3 results)  No results for input(s): PROBNP in the last 8760 hours. HbA1C: No results for input(s): HGBA1C in the last 72 hours. CBG: No results for input(s): GLUCAP in the last 168 hours. Lipid Profile: No results for input(s): CHOL, HDL, LDLCALC, TRIG, CHOLHDL, LDLDIRECT in the last 72 hours. Thyroid Function Tests: No results for input(s): TSH, T4TOTAL, FREET4, T3FREE, THYROIDAB in the last 72 hours. Anemia Panel: Recent Labs    03/13/20 0339 03/14/20 0416  FERRITIN 21 27   Urine analysis:    Component Value Date/Time   COLORURINE STRAW (A) 11/16/2019 2016   APPEARANCEUR CLEAR 11/16/2019 2016   LABSPEC 1.018 11/16/2019 2016   PHURINE 8.0 11/16/2019 2016   GLUCOSEU NEGATIVE 11/16/2019 2016   GLUCOSEU NEGATIVE 11/21/2016 1510   HGBUR NEGATIVE 11/16/2019 2016   BILIRUBINUR NEGATIVE 11/16/2019 2016   BILIRUBINUR negative 09/12/2019 1030   [place] 11/16/2019 2016   PROTEINUR NEGATIVE 11/16/2019 2016   UROBILINOGEN 0.2 09/12/2019 1030   UROBILINOGEN 0.2 06/26/2017 1546   NITRITE NEGATIVE 11/16/2019 2016   LEUKOCYTESUR NEGATIVE 11/16/2019 2016    Sepsis Labs: @LABRCNTIP (procalcitonin:4,lacticidven:4)  )No results found for this or any previous visit (from the past 240 hour(s)).    Studies: VAS [place] LOWER EXTREMITY VENOUS (DVT)  Result Date: 03/13/2020  Lower Venous DVT Study Indications: Elevated Ddimer.  Risk Factors: COVID 19 positive. Comparison Study: No prior studies. Performing Technologist: Oliver Hum RVT  Examination Guidelines: A complete evaluation includes B-mode imaging, spectral Doppler, color Doppler, and power Doppler as needed of all accessible portions of each vessel. Bilateral testing is considered an integral part of a complete examination. Limited examinations for reoccurring indications may be performed as noted. The reflux portion of the exam is performed with the patient in reverse Trendelenburg.  +---------+---------------+---------+-----------+----------+--------------+ RIGHT    CompressibilityPhasicitySpontaneityPropertiesThrombus Aging +---------+---------------+---------+-----------+----------+--------------+ CFV      Full           Yes      Yes                                 +---------+---------------+---------+-----------+----------+--------------+ SFJ      Full                                                        +---------+---------------+---------+-----------+----------+--------------+ FV Prox  Full                                                        +---------+---------------+---------+-----------+----------+--------------+ FV Mid   Full                                                        +---------+---------------+---------+-----------+----------+--------------+ FV DistalFull                                                        +---------+---------------+---------+-----------+----------+--------------+  PFV      Full                                                        +---------+---------------+---------+-----------+----------+--------------+ POP       Full           Yes      Yes                                 +---------+---------------+---------+-----------+----------+--------------+ PTV      Full                                                        +---------+---------------+---------+-----------+----------+--------------+ PERO     Full                                                        +---------+---------------+---------+-----------+----------+--------------+   +---------+---------------+---------+-----------+----------+--------------+ LEFT     CompressibilityPhasicitySpontaneityPropertiesThrombus Aging +---------+---------------+---------+-----------+----------+--------------+ CFV      Full           Yes      Yes                                 +---------+---------------+---------+-----------+----------+--------------+ SFJ      Full                                                        +---------+---------------+---------+-----------+----------+--------------+ FV Prox  Full                                                        +---------+---------------+---------+-----------+----------+--------------+ FV Mid   Full                                                        +---------+---------------+---------+-----------+----------+--------------+ FV DistalFull                                                        +---------+---------------+---------+-----------+----------+--------------+ PFV      Full                                                        +---------+---------------+---------+-----------+----------+--------------+  POP      Full           Yes      Yes                                 +---------+---------------+---------+-----------+----------+--------------+ PTV      Full                                                        +---------+---------------+---------+-----------+----------+--------------+ PERO     Full                                                         +---------+---------------+---------+-----------+----------+--------------+     Summary: RIGHT: - There is no evidence of deep vein thrombosis in the lower extremity.  - No cystic structure found in the popliteal fossa.  LEFT: - There is no evidence of deep vein thrombosis in the lower extremity.  - No cystic structure found in the popliteal fossa.  *See table(s) above for measurements and observations. Electronically signed by Monica Martinez MD on 03/13/2020 at 4:10:19 PM.    Final     Scheduled Meds: . vitamin C  500 mg Oral Daily  . cholecalciferol  1,000 Units Oral Once per day on Sun Sat  . fluticasone  1 puff Inhalation BID  . Ipratropium-Albuterol  1 puff Inhalation Q6H  . levothyroxine  50 mcg Oral Once per day on Mon Tue Wed Thu Fri  . levothyroxine  75 mcg Oral Once per day on Sun Sat  . mesalamine  2.4 g Oral Q breakfast  . zinc sulfate  220 mg Oral Daily    Continuous Infusions: . remdesivir 100 mg in NS 100 mL 100 mg (03/13/20 1053)     LOS: 0 days     Cristal Deer, MD [facility]  To reach me or the doctor on call, go to: [email or web address]  03/14/2020, 9:34 AM

## 2020-03-14 NOTE — Consult Note (Signed)
Consultation Note Date: 03/14/2020   Patient Name: [name]  DOB: 1934/04/24  MRN: [id number]  Age / Sex: 86 y.o., female   PCP: Caren Macadam, MD Referring Physician: Cristal Deer, MD   REASON FOR CONSULTATION:Establishing goals of care  Palliative Care consult requested for goals of care discussion in this 86 y.o. female with multiple medical problems including asthma, Purpura, hypothyroidism, anemia, GERD, and hypertension. Presenting with 10 days of cough and fatigue  Clinical Assessment and Goals of Care: I have reviewed medical records including lab results, imaging, Epic notes, and MAR. I spoke with patient to discuss diagnosis prognosis, GOC, EOL wishes, disposition and options.  Patient is awake, alert and oriented x3. She is appropriate to engage in goals of care.   I introduced Palliative Medicine as specialized medical care for people living with serious illness. It focuses on providing relief from the symptoms and stress of a serious illness. The goal is to improve quality of life for both the patient and the family. Patient verbalized understanding and appreciation.   We discussed a brief life review of the patient, along with her functional and nutritional status. [name] states she is widowed. She has 2 sons [name]- local and [name]- in [place]). She has several grandchildren. Retired Programme researcher, broadcasting/film/video.   She reports prior to admission she lived alone. Her son lives 1 mile away and is there with her daily. Ambulatory without assistive device, however does have walker on hand. She reports ability to perform all ADLs independently including cooking her own meals until her recent illness.   We discussed Her current illness and what it means in the larger context of Her on-going co-morbidities. Natural disease trajectory and expectations at EOL were discussed.  [name] verbalizes a clear understanding of her  current illness and c-morbidities. She shares she has tried to remain in her home to keep from [place] and is somewhat upset she is in the hospital due to this.   We discussed at length her limitations in working with therapy due to lower extremity leg pain. We discussed current pain regimen with Tylenol and availability of Vicodin and/or Ultram. She last took both PRN medications on yesterday. Patient states she felt as tho she received better relief with Vicodin however, she was somewhat dizzy/drowsy afterwards and wanted to rest. We discussed at length making adjustments to medications however, she declines and feels what she has available is sufficient.   I attempted to elicit values and goals of care important to the patient.    The difference between aggressive medical intervention and comfort care was considered in light of the patient's goals of care.  [name] is clear in her expressed goals that although her health is not the best she felt her previous quality of life was good. She is requesting watchful waiting, continue with current treatment and any necessary work-ups. She confirms she would not want her life to be prolonged and if her health further declined she would want to focus on her comfort at that point. She remains hopeful she will be able to return home with family support. She shares she lives in a senior citizen community with much community support also.   Advanced directives, concepts specific to code status, artifical feeding and hydration, and rehospitalization were considered and discussed. Patient reports she does have a documented advanced directive. Her son [name] is her HCPOA.   [name] confirms wishes for  DNR and no life-prolonging measures such as dialysis or artificial feedings.   Hospice and Palliative Care services outpatient were explained and offered. Patient verbalized her understanding and awareness of both palliative and hospice's goals and  philosophy of care. At this time given her wishes are to continue with treatment and remaining hopeful that she can return home with family support with some improvement she is open to outpatient Palliative support.   Questions and concerns were addressed. The family was encouraged to call with questions or concerns.  PMT will continue to support holistically.   SOCIAL HISTORY:     reports that she has never smoked. She has never used smokeless tobacco. She reports that she does not drink alcohol and does not use drugs.  CODE STATUS: DNR  ADVANCE DIRECTIVES: Primary Decision Maker: [name] (Son/HCPOA)    SYMPTOM MANAGEMENT: per attending patient currently has Vicodin and Ultram available as needed. Requesting no changes.   Palliative Prophylaxis:   Aspiration, Bowel Regimen, Delirium Protocol, Eye Care, Frequent Pain Assessment and Turn Reposition  PSYCHO-SOCIAL/SPIRITUAL:  Support System: Family  Desire for further Chaplaincy support:NO   Additional Recommendations (Limitations, Scope, Preferences):  No Artificial Feeding and Continue to treat the treatable, DNR   Education on hospice/palliative    PAST MEDICAL HISTORY: Past Medical History:  Diagnosis Date  . ABDOMINAL PAIN, CHRONIC 05/28/2008  . ALLERGIC RHINITIS 09/21/2006  . ANEMIA-NOS 09/21/2006  . ASTHMA 09/21/2006  . ASYMPTOMATIC POSTMENOPAUSAL STATUS 05/28/2008  . Cataract   . CHEST PAIN, ATYPICAL 05/28/2008  . COPD (chronic obstructive pulmonary disease) ([place])   . COVID-19 virus infection 02/2020  . DDD (degenerative disc disease)   . DIVERTICULITIS, HX OF 09/21/2006  . GERD 09/21/2006  . HYPERCHOLESTEROLEMIA 06/05/2009  . HYPERTENSION 09/21/2006  . HYPOTHYROIDISM 09/21/2006  . Peptic ulcer disease   . Purpura ([place])   . RENAL INSUFFICIENCY 09/21/2006  . Shingles   . Small bowel obstruction ([place])   . Somatization disorder 04/25/2007  . UNSPECIFIED URINARY CALCULUS 05/28/2008  . UTI (urinary tract infection)  07/2019    ALLERGIES:  is allergic to [place] nut (berthollefia excelsa) skin test, fluconazole, metronidazole, pepcid [famotidine], shellfish allergy, macrobid [nitrofurantoin], and pantoprazole.   MEDICATIONS:  Current Facility-Administered Medications  Medication Dose Route Frequency Provider Last Rate Last Admin  . acetaminophen (TYLENOL) tablet 650 mg  650 mg Oral Q4H PRN Pokhrel, Laxman, MD   650 mg at 03/14/20 0815  . ascorbic acid (VITAMIN C) tablet 500 mg  500 mg Oral Daily Kyle, Tyrone A, DO   500 mg at 03/14/20 1026  . chlorpheniramine-HYDROcodone (TUSSIONEX) 10-8 MG/5ML suspension 5 mL  5 mL Oral Q12H PRN Marylyn Ishihara, Tyrone A, DO      . cholecalciferol (VITAMIN D3) tablet 1,000 Units  1,000 Units Oral Once per day on Sun Sat Flora Lipps, MD   1,000 Units at 03/14/20 1043  . fluticasone (FLOVENT HFA) 110 MCG/ACT inhaler 1 puff  1 puff Inhalation BID Pokhrel, Laxman, MD   1 puff at 03/14/20 0819  . guaiFENesin-dextromethorphan (ROBITUSSIN DM) 100-10 MG/5ML syrup 10 mL  10 mL Oral Q4H PRN Marylyn Ishihara, Tyrone A, DO   10 mL at 03/11/20 2044  . HYDROcodone-acetaminophen (NORCO/VICODIN) 5-325 MG per tablet 1 tablet  1 tablet Oral Q6H PRN Pokhrel, Laxman, MD   1 tablet at 03/13/20 1348  . Ipratropium-Albuterol (COMBIVENT) respimat 1 puff  1 puff Inhalation Q6H Kyle, Tyrone A, DO   1 puff at 03/14/20 0816  . levothyroxine (SYNTHROID) tablet 50 mcg  50 mcg Oral Once per day on Mon Tue Wed Thu Fri Flora Lipps, MD   50 mcg at 03/13/20 0451  . levothyroxine (SYNTHROID) tablet 75 mcg  75 mcg Oral Once per day on Sun Sat Flora Lipps, MD   75 mcg at 03/14/20 0533  . mesalamine (LIALDA) EC tablet 2.4 g  2.4 g Oral Q breakfast Pokhrel, Laxman, MD   2.4 g at 03/14/20 0815  . ondansetron (ZOFRAN) tablet 4 mg  4 mg Oral Q6H PRN Marylyn Ishihara, Tyrone A, DO       Or  . ondansetron (ZOFRAN) injection 4 mg  4 mg Intravenous Q6H PRN Marylyn Ishihara, Tyrone A, DO      . polyvinyl alcohol (LIQUIFILM TEARS) 1.4 % ophthalmic  solution 1 drop  1 drop Both Eyes PRN Pokhrel, Laxman, MD   1 drop at 03/12/20 0848  . traMADol (ULTRAM) tablet 50 mg  50 mg Oral Q6H PRN Pokhrel, Laxman, MD   50 mg at 03/13/20 1039  . zinc sulfate capsule 220 mg  220 mg Oral Daily Kyle, Tyrone A, DO   220 mg at 03/14/20 1026    VITAL SIGNS: BP 117/65 (BP Location: Left Arm)   Pulse 74   Temp 99 F (37.2 C) (Oral)   Resp 16   Ht 5' 5"  (1.651 m)   Wt 63.5 kg   SpO2 97%   BMI 23.30 kg/m  Filed Weights   03/11/20 1255  Weight: 63.5 kg    Estimated body mass index is 23.3 kg/m as calculated from the following:   Height as of this encounter: 5' 5"  (1.651 m).   Weight as of this encounter: 63.5 kg.  LABS: CBC:    Component Value Date/Time   WBC 11.0 (H) 03/14/2020 0416   HGB 10.1 (L) 03/14/2020 0416   HCT 30.4 (L) 03/14/2020 0416   PLT 465 (H) 03/14/2020 0416   Comprehensive Metabolic Panel:    Component Value Date/Time   NA 126 (L) 03/14/2020 0416   K 3.8 03/14/2020 0416   BUN 24 (H) 03/14/2020 0416   CREATININE 0.54 03/14/2020 0416   CREATININE 0.56 (L) 11/03/2014 1807   ALBUMIN 3.0 (L) 03/14/2020 0416     Review of Systems  Unable to perform ROS: Acuity of condition   The above conversation was completed via telephone due to the visitor restrictions during the COVID-19 pandemic. Thorough chart review and discussion with necessary members of the care team was completed as part of assessment. All issues were discussed and addressed but no physical exam was performed.  Prognosis: Guarded  Discharge Planning:  Home with Palliative Services  Recommendations: . DNR/DNI-as confirmed by patient . Continue with current plan of care per medical team  . Patient remains hopeful she will improve/stabilize with ability to return home with family and community support. If health further declines she would then would wish to focus on her comfort.  . Requesting outpatient Palliative support at discharge. (TOC referral  placed) . Discussed at length lower extremity pain and limitations. Reviewed current medications. Patient does not wish to have adjustments to her medications and feels they provided relief when taking yesterday.  Marland Kitchen PMT will continue to support and follow. Please call team line with urgent needs.   Palliative Performance Scale: PPS 30-40%               Mrs. Najarro expressed understanding and was in agreement with this plan.   Thank you for allowing the Palliative Medicine Team to assist  in the care of this patient.  Time In: 1400 Time Out: 1450 Time Total: 50 min  Visit consisted of counseling and education dealing with the complex and emotionally intense issues of symptom management and palliative care in the setting of serious and potentially life-threatening illness.Greater than 50%  of this time was spent counseling and coordinating care related to the above assessment and plan.  Signed by:  Alda Lea, AGPCNP-BC Palliative Medicine Team  Phone: [phone] Pager: [phone] Amion: Bjorn Pippin

## 2020-03-15 DIAGNOSIS — U071 COVID-19: Secondary | ICD-10-CM | POA: Diagnosis not present

## 2020-03-15 LAB — COMPREHENSIVE METABOLIC PANEL WITH GFR
ALT: 12 U/L (ref 0–44)
AST: 16 U/L (ref 15–41)
Albumin: 2.5 g/dL — ABNORMAL LOW (ref 3.5–5.0)
Alkaline Phosphatase: 39 U/L (ref 38–126)
Anion gap: 6 (ref 5–15)
BUN: 20 mg/dL (ref 8–23)
CO2: 22 mmol/L (ref 22–32)
Calcium: 7.8 mg/dL — ABNORMAL LOW (ref 8.9–10.3)
Chloride: 105 mmol/L (ref 98–111)
Creatinine, Ser: 0.52 mg/dL (ref 0.44–1.00)
GFR, Estimated: >60 mL/min
Glucose, Bld: 83 mg/dL (ref 70–99)
Potassium: 3.7 mmol/L (ref 3.5–5.1)
Sodium: 133 mmol/L — ABNORMAL LOW (ref 135–145)
Total Bilirubin: 0.4 mg/dL (ref 0.3–1.2)
Total Protein: 4.9 g/dL — ABNORMAL LOW (ref 6.5–8.1)

## 2020-03-15 LAB — CBC WITH DIFFERENTIAL/PLATELET
Abs Immature Granulocytes: 0.1 10*3/uL — ABNORMAL HIGH (ref 0.00–0.07)
Basophils Absolute: 0.1 10*3/uL (ref 0.0–0.1)
Basophils Relative: 1 %
Eosinophils Absolute: 0 10*3/uL (ref 0.0–0.5)
Eosinophils Relative: 0 %
HCT: 27.8 % — ABNORMAL LOW (ref 36.0–46.0)
Hemoglobin: 8.9 g/dL — ABNORMAL LOW (ref 12.0–15.0)
Immature Granulocytes: 1 %
Lymphocytes Relative: 19 %
Lymphs Abs: 1.6 10*3/uL (ref 0.7–4.0)
MCH: 30.5 pg (ref 26.0–34.0)
MCHC: 32 g/dL (ref 30.0–36.0)
MCV: 95.2 fL (ref 80.0–100.0)
Monocytes Absolute: 1.2 10*3/uL — ABNORMAL HIGH (ref 0.1–1.0)
Monocytes Relative: 14 %
Neutro Abs: 5.5 10*3/uL (ref 1.7–7.7)
Neutrophils Relative %: 65 %
Platelets: 444 10*3/uL — ABNORMAL HIGH (ref 150–400)
RBC: 2.92 MIL/uL — ABNORMAL LOW (ref 3.87–5.11)
RDW: 14.6 % (ref 11.5–15.5)
WBC: 8.5 10*3/uL (ref 4.0–10.5)
nRBC: 0 % (ref 0.0–0.2)

## 2020-03-15 LAB — C-REACTIVE PROTEIN: CRP: 6.5 mg/dL — ABNORMAL HIGH (ref <1.0)

## 2020-03-15 LAB — PHOSPHORUS: Phosphorus: 1.8 mg/dL — ABNORMAL LOW (ref 2.5–4.6)

## 2020-03-15 LAB — MAGNESIUM: Magnesium: 1.9 mg/dL (ref 1.7–2.4)

## 2020-03-15 LAB — FERRITIN: Ferritin: 29 ng/mL (ref 11–307)

## 2020-03-15 LAB — D-DIMER, QUANTITATIVE: D-Dimer, Quant: 0.78 ug{FEU}/mL — ABNORMAL HIGH (ref 0.00–0.50)

## 2020-03-15 NOTE — Evaluation (Signed)
Physical Therapy Evaluation Patient Details Name: [name] MRN: [id number] DOB: 1934-05-18 Today's Date: 03/15/2020   History of Present Illness  86 y.o. female with medical history significant of asthma, purpura, hypothyroidism presented to hospital with cough and fatigue for last 10 days.  Patient was diagnosed with Covid infection almost 10 days back and was sent home with recommendation for isolation.  At that time, she also had symptomatic anemia but did not wish to proceed with further evaluation.  She was advised to follow-up with GI as outpatient.  Patient continued to have cough, fatigue and weakness her primary care provider advised her to come to the hospital.  In the ED, chest x-ray was noted to be clear.  She was found to have hemoglobin of 7.6 and FOBT was positive.  GI was consulted but patient denied further work-up.  Clinical Impression  Pt admitted with above diagnosis.  This was PT's 3rd attempt as pt has refused on previous visits. Pt requires max encouragement to participate at all. She talks about wanting to die however when questioned about her conversation with [facility] team she states she never told them she wanted to try to treat what she could, nor did not say she wanted to live.   Pt would not be able to go home and care for herself at her current status, and given 2 recent hospital admissions,  recommend SNF post acute.  Pt son works and cannot provide 24hr care.  The pt would likely be able to d/c home with HHPT if she had been participating and mobilizing prior to today. As soon as PT left the room (after discussion regarding importance of OOB/activity) pt called to go back to bed. Pt VSS with SpO2=96-100% on RA  Pt currently with functional limitations due to the deficits listed below (see PT Problem List). Pt will benefit from skilled PT to increase their independence and safety with mobility to allow discharge to the venue listed below.       Follow Up Recommendations  SNF    Equipment Recommendations  None recommended by PT    Recommendations for Other Services       Precautions / Restrictions Precautions Precautions: Fall Restrictions Weight Bearing Restrictions: No      Mobility  Bed Mobility Overal bed mobility: Needs Assistance Bed Mobility: Supine to Sit     Supine to sit: Min assist     General bed mobility comments: max cues for participation and self assist. min assist to fully elevate trunk    Transfers Overall transfer level: Needs assistance Equipment used: Rolling walker (2 wheeled) Transfers: Sit to/from [facility] Sit to Stand: Min assist Stand pivot transfers: Min assist       General transfer comment: cues for hand placement and to pwer up with LEs. light assist to rise and transition to RW  Ambulation/Gait Ambulation/Gait assistance: Min assist Gait Distance (Feet): 4 Feet Assistive device: Rolling walker (2 wheeled) Gait Pattern/deviations: Step-to pattern;Decreased step length - right;Decreased step length - left;Trunk flexed;Narrow base of support     General Gait Details: pt took a few steps fwd and back to chari. min assist for balance and cues for overall safety  Stairs            Wheelchair Mobility    Modified Rankin (Stroke Patients Only)       Balance Overall balance assessment: Needs assistance Sitting-balance support: Feet supported;No upper extremity supported Sitting balance-Leahy Scale: Fair     Standing balance  support: During functional activity;Bilateral upper extremity supported Standing balance-Leahy Scale: Poor Standing balance comment: reliant on UEs                             Pertinent Vitals/Pain Pain Assessment: Faces Faces Pain Scale: Hurts even more Pain Location: back Pain Descriptors / Indicators: Grimacing;Sore Pain Intervention(s): Limited activity within patient's tolerance;Monitored during session;Repositioned    Home  Living Family/patient expects to be discharged to:: Private residence Living Arrangements: Alone Available Help at Discharge: Family Type of Home: Apartment Home Access: Level entry     Home Layout: One level Home Equipment: Grab bars - tub/shower;Cane - single point;Walker - 2 wheels;Shower seat Additional Comments: Living in senior community apt. Has life alert button    Prior Function Level of Independence: Independent with assistive device(s)         Comments: Uses cane for ambulation outside the home, able to complete BADL/IADL     Hand Dominance        Extremity/Trunk Assessment   Upper Extremity Assessment Upper Extremity Assessment: Defer to OT evaluation;Generalized weakness    Lower Extremity Assessment Lower Extremity Assessment: Generalized weakness       Communication   Communication: No difficulties  Cognition Arousal/Alertness: Awake/alert Behavior During Therapy: WFL for tasks assessed/performed Overall Cognitive Status: Within Functional Limits for tasks assessed                                        General Comments      Exercises     Assessment/Plan    PT Assessment Patient needs continued PT services  PT Problem List Decreased strength;Decreased mobility;Decreased activity tolerance;Decreased balance;Decreased knowledge of use of DME       PT Treatment Interventions DME instruction;Gait training;Functional mobility training;Therapeutic activities;Therapeutic exercise;Patient/family education;Balance training    PT Goals (Current goals can be found in the Care Plan section)  Acute Rehab PT Goals Patient Stated Goal: none stated PT Goal Formulation: With patient Time For Goal Achievement: 03/29/20 Potential to Achieve Goals: Fair    Frequency Min 2X/week   Barriers to discharge        Co-evaluation               AM-PAC PT "6 Clicks" Mobility  Outcome Measure Help needed turning from your back to your  side while in a flat bed without using bedrails?: A Little Help needed moving from lying on your back to sitting on the side of a flat bed without using bedrails?: A Little Help needed moving to and from a bed to a chair (including a wheelchair)?: A Little Help needed standing up from a chair using your arms (e.g., wheelchair or bedside chair)?: A Little Help needed to walk in hospital room?: A Lot   6 Click Score: 14    End of Session Equipment Utilized During Treatment: Gait belt Activity Tolerance: Patient limited by fatigue Patient left: in chair;with call bell/phone within reach;with chair alarm set Nurse Communication: Mobility status PT Visit Diagnosis: Other abnormalities of gait and mobility (R26.89);Unsteadiness on feet (R26.81);Muscle weakness (generalized) (M62.81)    Time: 3299-2426 PT Time Calculation (min) (ACUTE ONLY): 25 min   Charges:   PT Evaluation $PT Eval Low Complexity: 1 Low PT Treatments $Therapeutic Activity: 8-22 mins        Baxter Flattery, PT  Acute Rehab Dept ([place]) [phone]  Pager [phone]  03/15/2020   [facility] 03/15/2020, 1:31 PM

## 2020-03-15 NOTE — Progress Notes (Signed)
Patient appears to be tearful and emotional. States "I am going downhill, I wish I could die" she also verbalized her back pain is bad, she sat on the chair earlier on the day but state it made the pain worse. PRN tramadol given.

## 2020-03-15 NOTE — Progress Notes (Signed)
PROGRESS NOTE  [name]:[id number] DOB: October 05, 1934 DOA: 03/11/2020 PCP: Caren Macadam, MD  HPI/Recap of past 24 hours: [name]. [name] is an 86 year old female with medical history significant for asthma, purpura, hypothyroidism, who was admitted to the hospital for cough and fatigue for past 10 days she was diagnosed with Covid infection she was then discharged to isolate at home she was however readmitted because patient continued to feel cough fatigue and weakness and was already readmitted and noted to have a low hemoglobin of 7.6 which is positive for Hemoccult.  Subjective: March 15, 2020 Patient seen and examined at bedside she still feeling very poorly she stated that she has no energy and no willpower to do anything.  She is complaining of headache and facial pain.  Physical therapy was able to get her back from her bed to her chair and she is was very weak and did not like it.  March 14, 2020: Patient seen and examined at bedside she still feeling very poorly she is complaining of back pain queen weakness that suddenly comes upon her.  She could not participate in physical therapy evaluation yesterday  Assessment/Plan: Principal Problem:   COVID-19 virus infection Active Problems:   Hypothyroidism   Essential hypertension   GIB (gastrointestinal bleeding)  1 COVID-19 infection.  Patient was diagnosed as + March 04, 2020.  She is unvaccinated.  Her chest x-ray on admission was clear without infiltrate.  She has been started on remdesivir.  Her inflammatory markers are still elevated patient is on SCD for DVT prophylaxis .I will start her on Lovenox to reduce the risk of thromboembolism event We will continue to monitor  2.  Generalized weakness fatigue back and neck pain.  Generalized weakness likely due to her anemia and Covid infection.  She says she takes Tylenol at home yesterday was so bad she was given Vicodin but she said it made her really loopy and she  does not want to take that she wants to deal with it and take her Tylenol like she usually does at home.  Physical therapy attempted to evaluate her yesterday but could not because she was feeling so weak and in pain   3.  GI bleed Hemoccult was positive with acute on chronic symptomatic anemia likely secondary to chronic active ulcerative colitis.  Her last flex sig was October 2021.  Her hemoglobin was low and she received 1 unit of packed RBC and GI was consulted.   denies any abdominal pain today.  Her hemoglobin is down to 8.9 today.  We will continue to monitor.  4.  Hypothyroidism continue Synthroid  5.  Purpura not active continue outpatient follow-up and management  6.  Chronic active ulcerative colitis.  Patient had a endoscopy that was done in 2015 and she had a flex sig in October 2021 and it showed proctosigmoid colitis and was started on mesalamine and  Canasa suppositories when she is doing better  7.  Hyponatremia of 126, this may be contributing to her malaise and weakness.  We will start patient on IV hydration with normal saline.  Sodium is up to 133 today we will continue to monitor  Code Status: DNR  Severity of Illness: The appropriate patient status for this patient is INPATIENT. Inpatient status is judged to be reasonable and necessary in order to provide the required intensity of service to ensure the patient's safety. The patient's presenting symptoms, physical exam findings, and initial radiographic and laboratory data in  the context of their chronic comorbidities is felt to place them at high risk for further clinical deterioration. Furthermore, it is not anticipated that the patient will be medically stable for discharge from the hospital within 2 midnights of admission. The following factors support the patient status of inpatient.   " The patient's presenting symptoms include fatigue " The worrisome physical exam findings include fatigue unable to but begin physical  therapy. " The initial radiographic and laboratory data are worrisome because of COVID-19 infection. " The chronic co-morbidities include ulcerative colitis.  Safe to discharge at this time patient lives alone we will change her to inpatient   * I certify that at the point of admission it is my clinical judgment that the patient will require inpatient hospital care spanning beyond 2 midnights from the point of admission due to high intensity of service, high risk for further deterioration and high frequency of surveillance required.*    Family Communication: None today  Disposition Plan: Change to inpatient Home in 1 to 2 days possible SNF if she continues to feel very weak   Consultants:  GI  Procedures:  None  Antimicrobials:  None  DVT prophylaxis: SCD   Objective: Vitals:   03/14/20 1447 03/14/20 2035 03/15/20 0434 03/15/20 1509  BP: (!) 125/57 (!) 118/58 (!) 118/54 (!) 127/58  Pulse: 69 73 71 73  Resp: 15 16 16 17   Temp: 97.8 F (36.6 C) 98.5 F (36.9 C) 98.6 F (37 C) 98 F (36.7 C)  TempSrc: Oral Oral Oral Oral  SpO2: 98% 97% 98% 99%  Weight:      Height:        Intake/Output Summary (Last 24 hours) at 03/15/2020 1827 Last data filed at 03/15/2020 1508 Gross per 24 hour  Intake 2130.89 ml  Output 1125 ml  Net 1005.89 ml   Filed Weights   03/11/20 1255  Weight: 63.5 kg   Body mass index is 23.3 kg/m.  Exam:  . General: 86 y.o. year-old female well developed well nourished in no acute distress.  Alert and oriented x3. . Cardiovascular: Regular rate and rhythm with no rubs or gallops.  No thyromegaly or JVD noted.   Marland Kitchen Respiratory: Clear to auscultation with no wheezes or rales. Good inspiratory effort. . Abdomen: Soft nontender nondistended with normal bowel sounds x4 quadrants. . Musculoskeletal: No lower extremity edema. 2/4 pulses in all 4 extremities. . Skin: No ulcerative lesions noted or rashes, . Psychiatry: Mood is appropriate for condition  and setting    Data Reviewed: CBC: Recent Labs  Lab 03/11/20 1259 03/12/20 0521 03/13/20 0339 03/14/20 0416 03/15/20 0355  WBC 10.3 6.7 7.8 11.0* 8.5  NEUTROABS 5.2 2.8 4.3 7.8* 5.5  HGB 7.6* 9.3* 9.5* 10.1* 8.9*  HCT 24.1* 29.0* 28.9* 30.4* 27.8*  MCV 96.0 93.9 92.3 93.5 95.2  PLT 553* 472* 485* 465* 948*   Basic Metabolic Panel: Recent Labs  Lab 03/11/20 1259 03/12/20 0521 03/13/20 0339 03/14/20 0416 03/15/20 0355  NA 133* 131* 133* 126* 133*  K 4.0 4.1 4.2 3.8 3.7  CL 99 101 101 100 105  CO2 26 21* 24 21* 22  GLUCOSE 94 82 81 88 83  BUN 28* 19 23 24* 20  CREATININE 0.72 0.61 0.64 0.54 0.52  CALCIUM 9.2 8.4* 8.7* 8.3* 7.8*  MG  --  1.9 2.1 2.1 1.9  PHOS  --  3.5 2.8 2.8 1.8*   GFR: Estimated Creatinine Clearance: 47.9 mL/min (by C-G formula based on SCr of  0.52 mg/dL). Liver Function Tests: Recent Labs  Lab 03/11/20 1259 03/12/20 0521 03/13/20 0339 03/14/20 0416 03/15/20 0355  AST 25 23 19 21 16   ALT 18 17 16 14 12   ALKPHOS 40 43 40 43 39  BILITOT 0.3 0.6 0.3 0.6 0.4  PROT 6.4* 5.9* 5.5* 5.6* 4.9*  ALBUMIN 3.3* 3.2* 2.9* 3.0* 2.5*   No results for input(s): LIPASE, AMYLASE in the last 168 hours. No results for input(s): AMMONIA in the last 168 hours. Coagulation Profile: No results for input(s): INR, PROTIME in the last 168 hours. Cardiac Enzymes: No results for input(s): CKTOTAL, CKMB, CKMBINDEX, TROPONINI in the last 168 hours. BNP (last 3 results) No results for input(s): PROBNP in the last 8760 hours. HbA1C: No results for input(s): HGBA1C in the last 72 hours. CBG: No results for input(s): GLUCAP in the last 168 hours. Lipid Profile: No results for input(s): CHOL, HDL, LDLCALC, TRIG, CHOLHDL, LDLDIRECT in the last 72 hours. Thyroid Function Tests: No results for input(s): TSH, T4TOTAL, FREET4, T3FREE, THYROIDAB in the last 72 hours. Anemia Panel: Recent Labs    03/14/20 0416 03/15/20 0355  FERRITIN 27 29   Urine analysis:     Component Value Date/Time   COLORURINE STRAW (A) 11/16/2019 2016   APPEARANCEUR CLEAR 11/16/2019 2016   LABSPEC 1.018 11/16/2019 2016   PHURINE 8.0 11/16/2019 2016   GLUCOSEU NEGATIVE 11/16/2019 2016   GLUCOSEU NEGATIVE 11/21/2016 1510   HGBUR NEGATIVE 11/16/2019 2016   BILIRUBINUR NEGATIVE 11/16/2019 2016   BILIRUBINUR negative 09/12/2019 1030   [address] 11/16/2019 2016   PROTEINUR NEGATIVE 11/16/2019 2016   UROBILINOGEN 0.2 09/12/2019 1030   UROBILINOGEN 0.2 06/26/2017 1546   NITRITE NEGATIVE 11/16/2019 2016   LEUKOCYTESUR NEGATIVE 11/16/2019 2016   Sepsis Labs: @LABRCNTIP (procalcitonin:4,lacticidven:4)  )No results found for this or any previous visit (from the past 240 hour(s)).    Studies: No results found.  Scheduled Meds: . vitamin C  500 mg Oral Daily  . cholecalciferol  1,000 Units Oral Once per day on Sun Sat  . enoxaparin (LOVENOX) injection  40 mg Subcutaneous Daily  . fluticasone  1 puff Inhalation BID  . Ipratropium-Albuterol  1 puff Inhalation Q6H  . levothyroxine  50 mcg Oral Once per day on Mon Tue Wed Thu Fri  . levothyroxine  75 mcg Oral Once per day on Sun Sat  . mesalamine  2.4 g Oral Q breakfast  . zinc sulfate  220 mg Oral Daily    Continuous Infusions: . sodium chloride 125 mL/hr at 03/15/20 1728     LOS: 1 day     Cristal Deer, MD [facility]  To reach me or the doctor on call, go to: [email or web address] Password [email or web address]  03/15/2020, 6:27 PM

## 2020-03-15 NOTE — Progress Notes (Signed)
Pt up to chiar today with PT. Still c/o headache with +Covid. Tylenol given per pt request. Writer alerted MD this morning about left a/c old IV site. Catheter was removed yesterday and noted some "mucus" at insertion site. It is red and very painful to pt. Redness has improved this morning from yesterday. A small round mass noted under skin to area. Will continue to monitor.

## 2020-03-16 DIAGNOSIS — U071 COVID-19: Secondary | ICD-10-CM | POA: Diagnosis not present

## 2020-03-16 DIAGNOSIS — I10 Essential (primary) hypertension: Secondary | ICD-10-CM | POA: Diagnosis not present

## 2020-03-16 DIAGNOSIS — K922 Gastrointestinal hemorrhage, unspecified: Secondary | ICD-10-CM | POA: Diagnosis not present

## 2020-03-16 DIAGNOSIS — E039 Hypothyroidism, unspecified: Secondary | ICD-10-CM | POA: Diagnosis not present

## 2020-03-16 LAB — COMPREHENSIVE METABOLIC PANEL
ALT: 11 U/L (ref 0–44)
AST: 16 U/L (ref 15–41)
Albumin: 2.5 g/dL — ABNORMAL LOW (ref 3.5–5.0)
Alkaline Phosphatase: 39 U/L (ref 38–126)
Anion gap: 7 (ref 5–15)
BUN: 10 mg/dL (ref 8–23)
CO2: 21 mmol/L — ABNORMAL LOW (ref 22–32)
Calcium: 7.9 mg/dL — ABNORMAL LOW (ref 8.9–10.3)
Chloride: 107 mmol/L (ref 98–111)
Creatinine, Ser: 0.41 mg/dL — ABNORMAL LOW (ref 0.44–1.00)
GFR, Estimated: >60 mL/min (ref >60)
Glucose, Bld: 85 mg/dL (ref 70–99)
Potassium: 3.4 mmol/L — ABNORMAL LOW (ref 3.5–5.1)
Sodium: 135 mmol/L (ref 135–145)
Total Bilirubin: 0.7 mg/dL (ref 0.3–1.2)
Total Protein: 4.7 g/dL — ABNORMAL LOW (ref 6.5–8.1)

## 2020-03-16 LAB — CBC WITH DIFFERENTIAL/PLATELET
Abs Immature Granulocytes: 0.07 10*3/uL (ref 0.00–0.07)
Basophils Absolute: 0.1 10*3/uL (ref 0.0–0.1)
Basophils Relative: 1 %
Eosinophils Absolute: 0 10*3/uL (ref 0.0–0.5)
Eosinophils Relative: 0 %
HCT: 26.9 % — ABNORMAL LOW (ref 36.0–46.0)
Hemoglobin: 8.6 g/dL — ABNORMAL LOW (ref 12.0–15.0)
Immature Granulocytes: 1 %
Lymphocytes Relative: 19 %
Lymphs Abs: 1.3 10*3/uL (ref 0.7–4.0)
MCH: 29.9 pg (ref 26.0–34.0)
MCHC: 32 g/dL (ref 30.0–36.0)
MCV: 93.4 fL (ref 80.0–100.0)
Monocytes Absolute: 1.2 10*3/uL — ABNORMAL HIGH (ref 0.1–1.0)
Monocytes Relative: 16 %
Neutro Abs: 4.5 10*3/uL (ref 1.7–7.7)
Neutrophils Relative %: 63 %
Platelets: 443 10*3/uL — ABNORMAL HIGH (ref 150–400)
RBC: 2.88 MIL/uL — ABNORMAL LOW (ref 3.87–5.11)
RDW: 14.6 % (ref 11.5–15.5)
WBC: 7.1 10*3/uL (ref 4.0–10.5)
nRBC: 0 % (ref 0.0–0.2)

## 2020-03-16 LAB — MAGNESIUM: Magnesium: 1.9 mg/dL (ref 1.7–2.4)

## 2020-03-16 LAB — FERRITIN: Ferritin: 33 ng/mL (ref 11–307)

## 2020-03-16 LAB — PHOSPHORUS: Phosphorus: 1.6 mg/dL — ABNORMAL LOW (ref 2.5–4.6)

## 2020-03-16 LAB — C-REACTIVE PROTEIN: CRP: 4.8 mg/dL — ABNORMAL HIGH (ref <1.0)

## 2020-03-16 LAB — D-DIMER, QUANTITATIVE: D-Dimer, Quant: 1.05 ug/mL-FEU — ABNORMAL HIGH (ref 0.00–0.50)

## 2020-03-16 MED ORDER — MAGNESIUM OXIDE 400 (241.3 MG) MG PO TABS
400.0000 mg | ORAL_TABLET | Freq: Two times a day (BID) | ORAL | Status: DC
  Administered 2020-03-16 – 2020-03-20 (×9): 400 mg via ORAL
  Filled 2020-03-16 (×9): qty 1

## 2020-03-16 MED ORDER — MAGNESIUM SULFATE 2 GM/50ML IV SOLN
2.0000 g | Freq: Once | INTRAVENOUS | Status: AC
  Administered 2020-03-16: 2 g via INTRAVENOUS
  Filled 2020-03-16: qty 50

## 2020-03-16 MED ORDER — LIDOCAINE 5 % EX PTCH
1.0000 | MEDICATED_PATCH | CUTANEOUS | Status: DC
  Administered 2020-03-16 – 2020-03-19 (×4): 1 via TRANSDERMAL
  Filled 2020-03-16 (×5): qty 1

## 2020-03-16 MED ORDER — POTASSIUM CHLORIDE CRYS ER 20 MEQ PO TBCR
40.0000 meq | EXTENDED_RELEASE_TABLET | Freq: Two times a day (BID) | ORAL | Status: AC
  Administered 2020-03-16 – 2020-03-17 (×3): 40 meq via ORAL
  Filled 2020-03-16 (×3): qty 2

## 2020-03-16 NOTE — Plan of Care (Signed)
  Problem: Education: Goal: Knowledge of risk factors and measures for prevention of condition will improve Outcome: Progressing   Problem: Coping: Goal: Psychosocial and spiritual needs will be supported Outcome: Progressing   Problem: Respiratory: Goal: Will maintain a patent airway Outcome: Progressing Goal: Complications related to the disease process, condition or treatment will be avoided or minimized Outcome: Progressing   

## 2020-03-16 NOTE — Progress Notes (Signed)
PROGRESS NOTE  [name] ZDG:[id number] DOB: 24-Dec-1934 DOA: 03/11/2020 PCP: Caren Macadam, MD   LOS: 2 days   Brief narrative:  [name] is a 86 y.o. female with medical history significant of asthma, purpura, hypothyroidism presented to hospital with cough and fatigue for last 10 days.  Patient was diagnosed with Covid infection almost 10 days back and was sent home with recommendation for isolation.  At that time, she also had symptomatic anemia but did not wish to proceed with further evaluation.  She was advised to follow-up with GI as outpatient.  Patient continued to have cough, fatigue and weakness her primary care provider advised her to come to the hospital.  In the ED, chest x-ray was noted to be clear.  She was found to have hemoglobin of 7.6 and FOBT was positive.  GI was consulted but patient denied further work-up.  Hospitalist team was consulted for admission to the hospital for Covid infection and anemia.  During hospitalization, patient continues to feel poorly with generalized weakness, fatigue and generalized body ache and pain.  She was evaluated by physical therapy who recommended skilled nursing facility placement.  Assessment/Plan:  Principal Problem:   COVID-19 virus infection Active Problems:   Hypothyroidism   Essential hypertension   GIB (gastrointestinal bleeding)  COVID 19 infection Patient was first diagnosed with positive on 03/04/2020.  Unvaccinated patient.   Continue inhaler, antitussives.   Chest x-ray without infiltrate.  Currently on supportive care.  Received remdesivir.  Patient is not hypoxic so steroids have not been given.  COVID-19 Labs  Recent Labs    03/14/20 0416 03/15/20 0355 03/16/20 0303  DDIMER 1.17* 0.78* 1.05*  FERRITIN 27 29 33  CRP 4.6* 6.5* 4.8*    Lab Results  Component Value Date   SARSCOV2NAA NEGATIVE 11/16/2019   [place] NEGATIVE 08/06/2019   SARSCOV2NAA NOT DETECTED 04/08/2019    Generalized  weakness, fatigue, pain on the back and neck, debility deconditioning.   takes Tylenol at home. on tramadol/Vicodin for pain.  Physical therapy has seen the patient and recommended skilled nursing facility placement.  Palliative care has seen the patient as well.  Patient lives alone.  Normally was doing ADLs at home.  Patient used to be on prednisone in the past for ulcerative colitis but is currently not on.  We will add Lidoderm patch today.  Add K pad over the neck.  Left leg pain.  Ultrasound of the bilateral lower extremity was negative for DVT.  Improved  GI bleed, fecal occult blood positive with acute on chronic symptomatic anemia Secondary to chronic active ulcerative colitis.  History of flexible sigmoidoscopy 11/2019.  Patient was noted to have a proctosigmoid colitis at that time and was given mesalamine and [place] suppositories for chronic active ulcerative colitis.  Patient received 1 unit of packed RBC this admission.  Seen by GI during this hospitalization.  Latest hemoglobin of 8.6.. Doing well on mesalamine at home.  Mesalamine has been restarted while in the hospital.  Hemoglobin stable at 9.5 today.  Patient has refused GI intervention.  CBC Latest Ref Rng & Units 03/16/2020 03/15/2020 03/14/2020  WBC 4.0 - 10.5 K/uL 7.1 8.5 11.0(H)  Hemoglobin 12.0 - 15.0 g/dL 8.6(L) 8.9(L) 10.1(L)  Hematocrit 36.0 - 46.0 % 26.9(L) 27.8(L) 30.4(L)  Platelets 150 - 400 K/uL 443(H) 444(H) 465(H)    Hypothyroidism Continue synthroid.  Purpura    continue outpt follow up.  Goals of care seen by palliative care.   Continue  current level of treatment.  Patient is DNR.  DVT prophylaxis: enoxaparin (LOVENOX) injection 40 mg Start: 03/14/20 1445 SCDs Start: 03/14/20 1429 SCDs Start: 03/11/20 2003  Code Status: DNR  Family Communication:  None today.  Status is: Inpatient  The patient iso inpatient because: IV treatments appropriate due to intensity of illness or inability to take PO and  Inpatient level of care appropriate due to severity of illness, electrolyte replacement, requiring skilled nursing facility placement  Dispo: The patient is from: Home              Anticipated d/c is skilled nursing facility              Anticipated d/c date is: 1 to 2 days              Patient currently is not medically stable to d/c.   Difficult to place patient No  Consultants:  None  Procedures:  PRBC transfusion 1 unit  Anti-infectives:  . Remdesivir 2/3>2/5  Subjective:  Today, patient was seen and examined at bedside.  Complains of multiple areas of pain including neck chest back.  Denies shortness of breath cough fever.  Feels weak and deconditioned.  Feels fatigued tired.   Objective: Vitals:   03/15/20 2014 03/16/20 0442  BP: (!) 163/83 (!) 147/69  Pulse: 92 70  Resp: 20 20  Temp: 98.2 F (36.8 C) 97.8 F (36.6 C)  SpO2: 97% 100%    Intake/Output Summary (Last 24 hours) at 03/16/2020 0829 Last data filed at 03/16/2020 0500 Gross per 24 hour  Intake 3012.44 ml  Output 2700 ml  Net 312.44 ml   Filed Weights   03/11/20 1255  Weight: 63.5 kg   Body mass index is 23.3 kg/m.   Physical Exam: General: Alert awake and communicative, not in obvious distress, anxious, thinly built, frail, chronically ill female HENT:   Mild pallor. Oral mucosa is moist.  Chest:    Diminished breath sounds bilaterally. No crackles or wheezes.  CVS: S1 &S2 heard. No murmur.  Regular rate and rhythm. Abdomen: Soft, nontender, nondistended.  Bowel sounds are heard.   Extremities: No cyanosis, clubbing or edema.  Peripheral pulses are palpable. Psych: Alert, awake and communicative, anxious, CNS:  No cranial nerve deficits.   Moves all extremities with generalized weakness Skin: Warm and dry.  No rashes noted.  Data Review: I have personally reviewed the following laboratory data and studies,  CBC: Recent Labs  Lab 03/12/20 0521 03/13/20 0339 03/14/20 0416 03/15/20 0355  03/16/20 0303  WBC 6.7 7.8 11.0* 8.5 7.1  NEUTROABS 2.8 4.3 7.8* 5.5 4.5  HGB 9.3* 9.5* 10.1* 8.9* 8.6*  HCT 29.0* 28.9* 30.4* 27.8* 26.9*  MCV 93.9 92.3 93.5 95.2 93.4  PLT 472* 485* 465* 444* 132*   Basic Metabolic Panel: Recent Labs  Lab 03/12/20 0521 03/13/20 0339 03/14/20 0416 03/15/20 0355 03/16/20 0303  NA 131* 133* 126* 133* 135  K 4.1 4.2 3.8 3.7 3.4*  CL 101 101 100 105 107  CO2 21* 24 21* 22 21*  GLUCOSE 82 81 88 83 85  BUN 19 23 24* 20 10  CREATININE 0.61 0.64 0.54 0.52 0.41*  CALCIUM 8.4* 8.7* 8.3* 7.8* 7.9*  MG 1.9 2.1 2.1 1.9 1.9  PHOS 3.5 2.8 2.8 1.8* 1.6*   Liver Function Tests: Recent Labs  Lab 03/12/20 0521 03/13/20 0339 03/14/20 0416 03/15/20 0355 03/16/20 0303  AST 23 19 21 16 16   ALT 17 16 14 12 11   ALKPHOS  43 40 43 39 39  BILITOT 0.6 0.3 0.6 0.4 0.7  PROT 5.9* 5.5* 5.6* 4.9* 4.7*  ALBUMIN 3.2* 2.9* 3.0* 2.5* 2.5*   No results for input(s): LIPASE, AMYLASE in the last 168 hours. No results for input(s): AMMONIA in the last 168 hours. Cardiac Enzymes: No results for input(s): CKTOTAL, CKMB, CKMBINDEX, TROPONINI in the last 168 hours. BNP (last 3 results) No results for input(s): BNP in the last 8760 hours.  ProBNP (last 3 results) No results for input(s): PROBNP in the last 8760 hours.  CBG: No results for input(s): GLUCAP in the last 168 hours. No results found for this or any previous visit (from the past 240 hour(s)).   Studies: No results found.    Flora Lipps, MD  [facility] 03/16/2020  If 7PM-7AM, please contact night-coverage

## 2020-03-16 NOTE — TOC Initial Note (Addendum)
Transition of Care [facility]) - Initial/Assessment Note    Patient Details  Name: [name] MRN: [id number] Date of Birth: Aug 15, 1934  Transition of Care [facility]) CM/SW Contact:    Trish Mage, LCSW Phone Number: 03/16/2020, 2:39 PM  Clinical Narrative:  Patient seen in follow up to PT recommendation of SNF/24 hr supervision.  Ms Levenhagen states she has no intention of going to SNF.  She cites several reasons for this.  First, she lives in an intentional retirement community "where everybody has a buddy and people are always looking out for each other."   Second, she states her son lives nearby and can easily come to help her "as long as he is not at work."  And finally, she has a life alert button that she wears and can call for help that way.  She is open to [facility] services, and gave me permission to call son. I spoke to Mr Duell to update him on my conversation and the plan.  While he is concerned about his mother and confirms that she will not have 24 hour supervision at home, he states he will support her decision and help her in any way he can. Will plan for return home with [facility] support. TOC will continue to follow during the course of hospitalization.  Addendum:  Cindie with [facility] confirms ability to provide [facility] in-home services.               Expected Discharge Plan: [place] Barriers to Discharge: No Barriers Identified   Patient Goals and CMS Choice Patient states their goals for this hospitalization and ongoing recovery are:: Go home   Choice offered to / list presented to : Patient  Expected Discharge Plan and Services Expected Discharge Plan: [place]   Discharge Planning Services: CM Consult Post Acute Care Choice: [place] arrangements for the past 2 months: Apartment                                      Prior Living Arrangements/Services Living arrangements for the past 2 months: Apartment Lives with:: Self Patient  language and need for interpreter reviewed:: Yes Do you feel safe going back to the place where you live?: Yes      Need for Family Participation in Patient Care: Yes (Comment) Care giver support system in place?: Yes (comment) Current home services: DME Criminal Activity/Legal Involvement Pertinent to Current Situation/Hospitalization: No - Comment as needed  Activities of Daily Living Home Assistive Devices/Equipment: None ADL Screening (condition at time of admission) Patient's cognitive ability adequate to safely complete daily activities?: Yes Is the patient deaf or have difficulty hearing?: No Does the patient have difficulty seeing, even when wearing glasses/contacts?: No Does the patient have difficulty concentrating, remembering, or making decisions?: No Patient able to express need for assistance with ADLs?: Yes Does the patient have difficulty dressing or bathing?: No Independently performs ADLs?: Yes (appropriate for developmental age) Does the patient have difficulty walking or climbing stairs?: Yes Weakness of Legs: Both Weakness of Arms/Hands: None  Permission Sought/Granted Permission sought to share information with : Family Supports Permission granted to share information with : Yes, Verbal Permission Granted  Share Information with NAME: Jamacia, Jester (Son)   [phone]           Emotional Assessment Appearance:: Appears stated age Attitude/Demeanor/Rapport: Engaged Affect (typically observed): Appropriate Orientation: :  Oriented to Self,Oriented to Place,Oriented to Situation Alcohol / Substance Use: Not Applicable Psych Involvement: No (comment)  Admission diagnosis:  GIB (gastrointestinal bleeding) [K92.2] Anemia, unspecified type [D64.9] COVID [U07.1] Patient Active Problem List   Diagnosis Date Noted  . GIB (gastrointestinal bleeding) 03/11/2020  . COVID-19 virus infection 02/2020  . Chronic ulcerative rectosigmoiditis with rectal bleeding ([place])    . Rectal bleeding   . Abnormal CT scan, gastrointestinal tract   . Lower GI bleed 11/16/2019  . LLQ abdominal pain 10/31/2019  . Diverticulitis 10/31/2019  . UTI (urinary tract infection) 08/06/2019  . COPD (chronic obstructive pulmonary disease) ([place])   . Colitis   . Vitamin D deficiency 05/01/2019  . Arthralgia 08/21/2017  . Osteoporosis 11/21/2016  . Postmenopausal status 09/06/2016  . Other nonspecific abnormal finding of lung field 08/14/2015  . Hyponatremia 07/21/2014  . Menopausal state 07/18/2014  . Hematest positive stools 08/05/2013  . Anemia, blood loss 08/05/2013  . Cerumen impaction 06/03/2013  . History of adrenal disorder 10/15/2012  . Rash and nonspecific skin eruption 10/15/2012  . Iron deficiency anemia 06/20/2012  . Psychogenic polydipsia 06/20/2012  . Nonspecific abnormal electrocardiogram (ECG) (EKG) 06/20/2012  . Polyuria 05/02/2012  . Pelvic pain in female 08/16/2011  . Leg ulcer ([place]) 06/16/2010  . Routine general medical examination at a health care facility 06/11/2010  . Encounter for long-term (current) use of other medications 06/11/2010  . HYPERCHOLESTEROLEMIA 06/05/2009  . UNSPECIFIED URINARY CALCULUS 05/28/2008  . CHEST PAIN, ATYPICAL 05/28/2008  . ABDOMINAL PAIN, CHRONIC 05/28/2008  . ASYMPTOMATIC POSTMENOPAUSAL STATUS 05/28/2008  . SOMATIZATION DISORDER 04/25/2007  . Hypothyroidism 09/21/2006  . Essential hypertension 09/21/2006  . ALLERGIC RHINITIS 09/21/2006  . ASTHMA 09/21/2006  . GERD 09/21/2006  . Disorder resulting from impaired renal function 09/21/2006  . DIVERTICULITIS, HX OF 09/21/2006   PCP:  Caren Macadam, MD Pharmacy:   [id number] [address] [id number]: [phone]: [phone]    Social Determinants of Health (SDOH) Interventions    Readmission Risk Interventions No flowsheet data found.

## 2020-03-17 DIAGNOSIS — K922 Gastrointestinal hemorrhage, unspecified: Secondary | ICD-10-CM | POA: Diagnosis not present

## 2020-03-17 DIAGNOSIS — E039 Hypothyroidism, unspecified: Secondary | ICD-10-CM | POA: Diagnosis not present

## 2020-03-17 DIAGNOSIS — I10 Essential (primary) hypertension: Secondary | ICD-10-CM | POA: Diagnosis not present

## 2020-03-17 DIAGNOSIS — U071 COVID-19: Secondary | ICD-10-CM | POA: Diagnosis not present

## 2020-03-17 MED ORDER — GUAIFENESIN-DM 100-10 MG/5ML PO SYRP
10.0000 mL | ORAL_SOLUTION | ORAL | Status: DC | PRN
  Administered 2020-03-17 – 2020-03-19 (×3): 10 mL via ORAL
  Filled 2020-03-17 (×5): qty 10

## 2020-03-17 MED ORDER — IPRATROPIUM-ALBUTEROL 20-100 MCG/ACT IN AERS
1.0000 | INHALATION_SPRAY | Freq: Four times a day (QID) | RESPIRATORY_TRACT | Status: DC | PRN
  Administered 2020-03-17 – 2020-03-18 (×2): 1 via RESPIRATORY_TRACT

## 2020-03-17 MED ORDER — K PHOS MONO-SOD PHOS DI & MONO 155-852-130 MG PO TABS
500.0000 mg | ORAL_TABLET | Freq: Three times a day (TID) | ORAL | Status: AC
  Administered 2020-03-17 – 2020-03-18 (×4): 500 mg via ORAL
  Filled 2020-03-17 (×4): qty 2

## 2020-03-17 NOTE — Progress Notes (Signed)
Physical Therapy Treatment Patient Details Name: [name] MRN: [id number] DOB: 23-Feb-1934 Today's Date: 03/17/2020    History of Present Illness 86 y.o. female with medical history significant of asthma, purpura, hypothyroidism presented to hospital with cough and fatigue for last 10 days.  Patient was diagnosed with Covid infection almost 10 days back and was sent home with recommendation for isolation.  At that time, she also had symptomatic anemia but did not wish to proceed with further evaluation.  She was advised to follow-up with GI as outpatient.  Patient continued to have cough, fatigue and weakness her primary care provider advised her to come to the hospital.  In the ED, chest x-ray was noted to be clear.  She was found to have hemoglobin of 7.6 and FOBT was positive.  GI was consulted but patient denied further work-up.  General Comments: AXO x 3 very sweet but required some encouragement.  Her only request was to go back to bed because of her back. General bed mobility comments: self able with increased time OOB but increased assist to support b LE back to bed due to back pain General transfer comment: cues for hand placement and to pwer up with LEs. light assist to rise and transition to RW.  Also assisted on/off toilet as well as assist with peri care due to balance instability to self attempt. General Gait Details: pt was able to amb 16 feet to and from bathroom but required assist for stability and present with weakness.  Remained on RA with avg sats 96% and 2/4 dyspnea.  This activty wore her out.  Assisted back to bed per pt request and positioned to comfort.  PT Comments      Follow Up Recommendations  SNF     Equipment Recommendations  None recommended by PT    Recommendations for Other Services       Precautions / Restrictions Precautions Precautions: Fall Precaution Comments: monitor sats    Mobility  Bed Mobility Overal bed mobility: Needs Assistance Bed  Mobility: Supine to Sit;Sit to Supine     Supine to sit: Supervision;Min guard Sit to supine: Min guard;Min assist   General bed mobility comments: self able with increased time OOB but increased assist to support b LE back to bed due to back pain  Transfers Overall transfer level: Needs assistance Equipment used: Rolling walker (2 wheeled) Transfers: Sit to/from [facility] Sit to Stand: Min assist Stand pivot transfers: Min assist       General transfer comment: cues for hand placement and to pwer up with LEs. light assist to rise and transition to RW.  Also assisted on/off toilet as well as assist with peri care due to balance instability to self attempt.  Ambulation/Gait Ambulation/Gait assistance: Min assist Gait Distance (Feet): 16 Feet (to and from bathroom) Assistive device: Rolling walker (2 wheeled) Gait Pattern/deviations: Step-to pattern;Decreased step length - right;Decreased step length - left;Trunk flexed Gait velocity: decreased   General Gait Details: pt was able to amb 16 feet to and from bathroom but required assist for stability and present with weakness.  Remained on RA with avg sats 96% and 2/4 dyspnea.  This activty wore her out.  Assisted back to bed per pt request and positioned to comfort.   Stairs             Wheelchair Mobility    Modified Rankin (Stroke Patients Only)       Balance  Cognition Arousal/Alertness: Awake/alert Behavior During Therapy: WFL for tasks assessed/performed Overall Cognitive Status: Within Functional Limits for tasks assessed                                 General Comments: AXO x 3 very sweet but required some encouragement.  Her only request was to go back to bed because of her back.      Exercises      General Comments        Pertinent Vitals/Pain Pain Assessment: Faces Faces Pain Scale: Hurts little  more Pain Location: back (chronic) pt stated DDD Pain Descriptors / Indicators: Grimacing;Sore Pain Intervention(s): Monitored during session;Repositioned    Home Living                      Prior Function            PT Goals (current goals can now be found in the care plan section) Progress towards PT goals: Progressing toward goals    Frequency    Min 2X/week      PT Plan Current plan remains appropriate    Co-evaluation              AM-PAC PT "6 Clicks" Mobility   Outcome Measure  Help needed turning from your back to your side while in a flat bed without using bedrails?: A Little Help needed moving from lying on your back to sitting on the side of a flat bed without using bedrails?: A Little Help needed moving to and from a bed to a chair (including a wheelchair)?: A Little Help needed standing up from a chair using your arms (e.g., wheelchair or bedside chair)?: A Little Help needed to walk in hospital room?: A Lot Help needed climbing 3-5 steps with a railing? : A Lot 6 Click Score: 16    End of Session Equipment Utilized During Treatment: Gait belt Activity Tolerance: Patient limited by fatigue Patient left: in bed;with call bell/phone within reach;with bed alarm set Nurse Communication: Mobility status PT Visit Diagnosis: Other abnormalities of gait and mobility (R26.89);Unsteadiness on feet (R26.81);Muscle weakness (generalized) (M62.81)     Time: 5300-5110 PT Time Calculation (min) (ACUTE ONLY): 25 min  Charges:  $Gait Training: 8-22 mins $Therapeutic Activity: 8-22 mins                     Rica Koyanagi  PTA Acute  Rehabilitation Services Pager      [phone] Office      [phone]

## 2020-03-17 NOTE — [place] FL2 (Signed)
Zamara Cozad [place] MEDICAID [id number] LEVEL OF CARE SCREENING TOOL     IDENTIFICATION  Patient Name: [name] Birthdate: [birth year] Sex: female Admission Date (Current Location): 03/11/2020  [facility] and [place] Number:  Producer, television/film/video and Address:  [facility],  [address], [place] [address]      Provider Number: [id number]  Attending Physician Name and Address:  Joycelyn Das, MD  Relative Name and Phone Number:  Sarang, [place])   [phone]    Current Level of Care: Hospital Recommended Level of Care: Skilled Nursing Facility Prior Approval Number:    Date Approved/Denied:   PASRR Number: [id number]  Discharge Plan: SNF    Current Diagnoses: Patient Active Problem List   Diagnosis Date Noted  . GIB (gastrointestinal bleeding) 03/11/2020  . COVID-19 virus infection 02/2020  . Chronic ulcerative rectosigmoiditis with rectal bleeding (HCC)   . Rectal bleeding   . Abnormal CT scan, gastrointestinal tract   . Lower GI bleed 11/16/2019  . LLQ abdominal pain 10/31/2019  . Diverticulitis 10/31/2019  . UTI (urinary tract infection) 08/06/2019  . COPD (chronic obstructive pulmonary disease) (HCC)   . Colitis   . Vitamin D deficiency 05/01/2019  . Arthralgia 08/21/2017  . Osteoporosis 11/21/2016  . Postmenopausal status 09/06/2016  . Other nonspecific abnormal finding of lung field 08/14/2015  . Hyponatremia 07/21/2014  . Menopausal state 07/18/2014  . Hematest positive stools 08/05/2013  . Anemia, blood loss 08/05/2013  . Cerumen impaction 06/03/2013  . History of adrenal disorder 10/15/2012  . Rash and nonspecific skin eruption 10/15/2012  . Iron deficiency anemia 06/20/2012  . Psychogenic polydipsia 06/20/2012  . Nonspecific abnormal electrocardiogram (ECG) (EKG) 06/20/2012  . Polyuria 05/02/2012  . Pelvic pain in female 08/16/2011  . Leg ulcer (HCC) 06/16/2010  . Routine general medical examination at a health care facility 06/11/2010  .  Encounter for long-term (current) use of other medications 06/11/2010  . HYPERCHOLESTEROLEMIA 06/05/2009  . UNSPECIFIED URINARY CALCULUS 05/28/2008  . CHEST PAIN, ATYPICAL 05/28/2008  . ABDOMINAL PAIN, CHRONIC 05/28/2008  . ASYMPTOMATIC POSTMENOPAUSAL STATUS 05/28/2008  . SOMATIZATION DISORDER 04/25/2007  . Hypothyroidism 09/21/2006  . Essential hypertension 09/21/2006  . ALLERGIC RHINITIS 09/21/2006  . ASTHMA 09/21/2006  . GERD 09/21/2006  . Disorder resulting from impaired renal function 09/21/2006  . DIVERTICULITIS, HX OF 09/21/2006    Orientation RESPIRATION BLADDER Height & Weight     Self,Time,Situation,Place  Normal External catheter Weight: 63.5 kg Height:  5\' 5"  (165.1 cm)  BEHAVIORAL SYMPTOMS/MOOD NEUROLOGICAL BOWEL NUTRITION STATUS   (none)  (none) Continent Diet (see d/c summary)  AMBULATORY STATUS COMMUNICATION OF NEEDS Skin   Extensive Assist Verbally Normal                       Personal Care Assistance Level of Assistance  Bathing,Feeding,Dressing Bathing Assistance: Maximum assistance Feeding assistance: Independent Dressing Assistance: Limited assistance     Functional Limitations Info  Sight,Hearing,Speech Sight Info: Adequate Hearing Info: Adequate Speech Info: Adequate    SPECIAL CARE FACTORS FREQUENCY  PT (By licensed PT),OT (By licensed OT)     PT Frequency: 5X/W OT Frequency: 5X/W            Contractures Contractures Info: Not present    Additional Factors Info  Code Status,Allergies Code Status Info: DNR Allergies Info: [place] Nut  Skin Test   Fluconazole   Metronidazole   Pepcid   Shellfish Allergy   Macrobid    Pantoprazole  Current Medications (03/17/2020):  This is the current hospital active medication list Current Facility-Administered Medications  Medication Dose Route Frequency Provider Last Rate Last Admin  . acetaminophen (TYLENOL) tablet 650 mg  650 mg Oral Q4H PRN Pokhrel, Laxman, MD   650 mg at 03/17/20  0505  . ascorbic acid (VITAMIN C) tablet 500 mg  500 mg Oral Daily Kyle, Tyrone A, DO   500 mg at 03/16/20 0958  . cholecalciferol (VITAMIN D3) tablet 1,000 Units  1,000 Units Oral Once per day on Sun Sat Joycelyn Das, MD   1,000 Units at 03/15/20 1032  . enoxaparin (LOVENOX) injection 40 mg  40 mg Subcutaneous Daily Myrtie Neither, MD   40 mg at 03/16/20 0959  . fluticasone (FLOVENT HFA) 110 MCG/ACT inhaler 1 puff  1 puff Inhalation BID Pokhrel, Laxman, MD   1 puff at 03/17/20 0907  . guaiFENesin-dextromethorphan (ROBITUSSIN DM) 100-10 MG/5ML syrup 10 mL  10 mL Oral Q4H PRN Ronaldo Miyamoto, Tyrone A, DO   10 mL at 03/11/20 2044  . HYDROcodone-acetaminophen (NORCO/VICODIN) 5-325 MG per tablet 1 tablet  1 tablet Oral Q6H PRN Pokhrel, Laxman, MD   1 tablet at 03/16/20 0105  . Ipratropium-Albuterol (COMBIVENT) respimat 1 puff  1 puff Inhalation Q6H PRN Pokhrel, Laxman, MD   1 puff at 03/17/20 0908  . levothyroxine (SYNTHROID) tablet 50 mcg  50 mcg Oral Once per day on Mon Tue Wed Thu Fri Joycelyn Das, MD   50 mcg at 03/17/20 0505  . levothyroxine (SYNTHROID) tablet 75 mcg  75 mcg Oral Once per day on Sun Sat Joycelyn Das, MD   75 mcg at 03/15/20 2130  . lidocaine (LIDODERM) 5 % 1 patch  1 patch Transdermal Q24H Pokhrel, Laxman, MD   1 patch at 03/16/20 1410  . magnesium oxide (MAG-OX) tablet 400 mg  400 mg Oral BID Pokhrel, Laxman, MD   400 mg at 03/16/20 2030  . mesalamine (LIALDA) EC tablet 2.4 g  2.4 g Oral Q breakfast Pokhrel, Laxman, MD   2.4 g at 03/17/20 0904  . ondansetron (ZOFRAN) tablet 4 mg  4 mg Oral Q6H PRN Ronaldo Miyamoto, Tyrone A, DO       Or  . ondansetron (ZOFRAN) injection 4 mg  4 mg Intravenous Q6H PRN Ronaldo Miyamoto, Tyrone A, DO   4 mg at 03/16/20 0958  . phosphorus (K PHOS NEUTRAL) tablet 500 mg  500 mg Oral TID Pokhrel, Laxman, MD      . polyvinyl alcohol (LIQUIFILM TEARS) 1.4 % ophthalmic solution 1 drop  1 drop Both Eyes PRN Pokhrel, Laxman, MD   1 drop at 03/12/20 0848  . potassium chloride SA  (KLOR-CON) CR tablet 40 mEq  40 mEq Oral BID Pokhrel, Laxman, MD   40 mEq at 03/16/20 2030  . traMADol (ULTRAM) tablet 50 mg  50 mg Oral Q6H PRN Pokhrel, Laxman, MD   50 mg at 03/16/20 1854  . zinc sulfate capsule 220 mg  220 mg Oral Daily Kyle, Tyrone A, DO   220 mg at 03/16/20 8657     Discharge Medications: Please see discharge summary for a list of discharge medications.  Relevant Imaging Results:  Relevant Lab Results:   Additional Information [address] [place], [place]

## 2020-03-17 NOTE — TOC Progression Note (Addendum)
Transition of Care [facility]) - Progression Note    Patient Details  Name: [name] MRN: [id number] Date of Birth: 07/30/1934  Transition [facility] Care [facility]) CM/SW [place], [place] Phone Number: 03/17/2020, 11:01 AM  Clinical Narrative:   Patient is now admitting to being too weak to care for self at home, is interested in going to short term rehab.  Bed search initiated. TOC will continue to follow during the course of hospitalization.  Addendum:  Confirmed with MD that patient is no longer on precautions. Bed search expanded.     Expected Discharge Plan: Skilled Nursing Facility Barriers to Discharge: SNF Covid  Expected Discharge Plan and Services Expected Discharge Plan: [place]   Discharge Planning Services: CM Consult Post Acute Care Choice: [place] arrangements for the past 2 months: Apartment                                       Social Determinants of Health (SDOH) Interventions    Readmission Risk Interventions No flowsheet data found.

## 2020-03-17 NOTE — Care Management Important Message (Signed)
Important Message  Patient Details IM Letter placed in Patient's door Caddy. Name: [name] MRN: [id number] Date of Birth: 02/18/1934   Medicare Important Message Given:  Yes     [name] 03/17/2020, 8:56 AM

## 2020-03-17 NOTE — Progress Notes (Addendum)
PROGRESS NOTE  [name] MPN:[id number] DOB: April 12, 1934 DOA: 03/11/2020 PCP: Caren Macadam, MD   LOS: 3 days   Brief narrative:  [name] is a 86 y.o. female with medical history significant of asthma, purpura, hypothyroidism presented to hospital with cough and fatigue for last 10 days.  Patient was diagnosed with Covid infection almost 10 days back and was sent home with recommendation for isolation.  At that time, she also had symptomatic anemia but did not wish to proceed with further evaluation.  She was advised to follow-up with GI as outpatient.  Patient continued to have cough, fatigue and weakness her primary care provider advised her to come to the hospital.  In the ED, chest x-ray was noted to be clear.  She was found to have hemoglobin of 7.6 and FOBT was positive.  GI was consulted but patient denied further work-up.  Hospitalist team was consulted for admission to the hospital for Covid infection and anemia.  During hospitalization, patient continues to feel poorly with generalized weakness, fatigue and generalized body ache and pain.  She was evaluated by physical therapy who recommended skilled nursing facility placement.  Assessment/Plan:  Principal Problem:   COVID-19 virus infection Active Problems:   Hypothyroidism   Essential hypertension   GIB (gastrointestinal bleeding)  COVID 19 infection Patient was first diagnosed with positive on 03/04/2020.  Unvaccinated patient.   Continue inhaler, antitussives.   Chest x-ray without infiltrate.  Currently on supportive care.  Received remdesivir and has completed 3-day course.  Patient is not hypoxic, so steroids have not been given.  COVID-19 Labs  Recent Labs    03/15/20 0355 03/16/20 0303  DDIMER 0.78* 1.05*  FERRITIN 29 33  CRP 6.5* 4.8*    Lab Results  Component Value Date   SARSCOV2NAA NEGATIVE 11/16/2019   [place] NEGATIVE 08/06/2019   SARSCOV2NAA NOT DETECTED 04/08/2019    Generalized  weakness, fatigue, pain on the back and neck, debility deconditioning.   Currently on tramadol Vicodin.  Physical therapy has seen the patient and recommended skilled nursing facility placement.  Palliative care has seen the patient as well.  Patient lives alone.  Normally was doing ADLs at home.  Patient used to be on prednisone in the past for ulcerative colitis but is currently not on.  Continue K pad, Lidoderm patch.  Bilateral leg pain.  Ultrasound of the bilateral lower extremity was negative for DVT.  Improved  GI bleed, fecal occult blood positive with acute on chronic symptomatic anemia Secondary to chronic active ulcerative colitis. History of flexible sigmoidoscopy 11/2019.  Patient was noted to have a proctosigmoid colitis at that time and was given mesalamine and [place] suppositories for chronic active ulcerative colitis.  Patient received 1 unit of packed RBC this admission.  Seen by GI during this hospitalization.  Latest hemoglobin of 8.6.. Doing well on mesalamine at home.  Hemoglobin of 8.6.  Patient has refused GI intervention.  CBC Latest Ref Rng & Units 03/16/2020 03/15/2020 03/14/2020  WBC 4.0 - 10.5 K/uL 7.1 8.5 11.0(H)  Hemoglobin 12.0 - 15.0 g/dL 8.6(L) 8.9(L) 10.1(L)  Hematocrit 36.0 - 46.0 % 26.9(L) 27.8(L) 30.4(L)  Platelets 150 - 400 K/uL 443(H) 444(H) 465(H)    Hypothyroidism Continue synthroid.  Hypokalemia.  Replenished  Hypophosphatemia replenished.  Purpura    continue outpt follow up.  Goals of care seen by palliative care.   Continue current level of treatment.  Patient is DNR.  DVT prophylaxis: enoxaparin (LOVENOX) injection 40 mg Start:  03/14/20 1445 SCDs Start: 03/14/20 1429 SCDs Start: 03/11/20 2003  Code Status: DNR  Family Communication:  Spoke with the patient's son yesterday.  Status is: Inpatient  The patient iso inpatient because: IV treatments appropriate due to intensity of illness or inability to take PO and Inpatient level of care  appropriate due to severity of illness, electrolyte replacement, requiring skilled nursing facility placement  Dispo: The patient is from: Home              Anticipated d/c is skilled nursing facility              Anticipated d/c date is: 1 to 2 days              Patient currently is medically stable to d/c.   Difficult to place patient No  Consultants:  None  Procedures:  PRBC transfusion 1 unit  Anti-infectives:  . Remdesivir 2/3>2/5  Subjective:  Today, patient was seen and examined at bedside.  Feels a little better overall.  Has generalized fatigue and weakness.  She is okay with nursing facility placement.  Pain in the neck seems to be little better.    Objective: Vitals:   03/16/20 2029 03/17/20 0459  BP: 116/65 (!) 144/66  Pulse: 78 85  Resp: 17 18  Temp: 98.5 F (36.9 C) 99 F (37.2 C)  SpO2: 100% 97%    Intake/Output Summary (Last 24 hours) at 03/17/2020 1119 Last data filed at 03/17/2020 1010 Gross per 24 hour  Intake 1732.96 ml  Output 950 ml  Net 782.96 ml   Filed Weights   03/11/20 1255  Weight: 63.5 kg   Body mass index is 23.3 kg/m.   Physical Exam: General: Alert awake and communicative but anxious, thinly built, frail, chronically  HENT:   As noted.  Oral mucosa is moist.  Chest:  Clear breath sounds.  Diminished breath sounds bilaterally. No crackles or wheezes.  CVS: S1 &S2 heard. No murmur.  Regular rate and rhythm. Abdomen: Soft, nontender, nondistended.  Bowel sounds are heard.   Extremities: No cyanosis, clubbing or edema.  Peripheral pulses are palpable.  Moves all extremities, generalized weakness Psych: Alert, awake and oriented, normal mood CNS:  No cranial nerve deficits.  Power equal in all extremities.   Skin: Warm and dry.  No rashes noted.  Data Review: I have personally reviewed the following laboratory data and studies,  CBC: Recent Labs  Lab 03/12/20 0521 03/13/20 0339 03/14/20 0416 03/15/20 0355 03/16/20 0303  WBC  6.7 7.8 11.0* 8.5 7.1  NEUTROABS 2.8 4.3 7.8* 5.5 4.5  HGB 9.3* 9.5* 10.1* 8.9* 8.6*  HCT 29.0* 28.9* 30.4* 27.8* 26.9*  MCV 93.9 92.3 93.5 95.2 93.4  PLT 472* 485* 465* 444* 951*   Basic Metabolic Panel: Recent Labs  Lab 03/12/20 0521 03/13/20 0339 03/14/20 0416 03/15/20 0355 03/16/20 0303  NA 131* 133* 126* 133* 135  K 4.1 4.2 3.8 3.7 3.4*  CL 101 101 100 105 107  CO2 21* 24 21* 22 21*  GLUCOSE 82 81 88 83 85  BUN 19 23 24* 20 10  CREATININE 0.61 0.64 0.54 0.52 0.41*  CALCIUM 8.4* 8.7* 8.3* 7.8* 7.9*  MG 1.9 2.1 2.1 1.9 1.9  PHOS 3.5 2.8 2.8 1.8* 1.6*   Liver Function Tests: Recent Labs  Lab 03/12/20 0521 03/13/20 0339 03/14/20 0416 03/15/20 0355 03/16/20 0303  AST 23 19 21 16 16   ALT 17 16 14 12 11   ALKPHOS 43 40 43 39 39  BILITOT 0.6 0.3 0.6 0.4 0.7  PROT 5.9* 5.5* 5.6* 4.9* 4.7*  ALBUMIN 3.2* 2.9* 3.0* 2.5* 2.5*   No results for input(s): LIPASE, AMYLASE in the last 168 hours. No results for input(s): AMMONIA in the last 168 hours. Cardiac Enzymes: No results for input(s): CKTOTAL, CKMB, CKMBINDEX, TROPONINI in the last 168 hours. BNP (last 3 results) No results for input(s): BNP in the last 8760 hours.  ProBNP (last 3 results) No results for input(s): PROBNP in the last 8760 hours.  CBG: No results for input(s): GLUCAP in the last 168 hours. No results found for this or any previous visit (from the past 240 hour(s)).   Studies: No results found.    Flora Lipps, MD  [facility] 03/17/2020  If 7PM-7AM, please contact night-coverage

## 2020-03-17 NOTE — Plan of Care (Signed)
  Problem: Education: Goal: Knowledge of risk factors and measures for prevention of condition will improve Outcome: Progressing   Problem: Coping: Goal: Psychosocial and spiritual needs will be supported Outcome: Progressing   Problem: Respiratory: Goal: Will maintain a patent airway Outcome: Progressing Goal: Complications related to the disease process, condition or treatment will be avoided or minimized Outcome: Progressing   

## 2020-03-18 DIAGNOSIS — U071 COVID-19: Secondary | ICD-10-CM | POA: Diagnosis not present

## 2020-03-18 DIAGNOSIS — I10 Essential (primary) hypertension: Secondary | ICD-10-CM | POA: Diagnosis not present

## 2020-03-18 DIAGNOSIS — E039 Hypothyroidism, unspecified: Secondary | ICD-10-CM | POA: Diagnosis not present

## 2020-03-18 DIAGNOSIS — K922 Gastrointestinal hemorrhage, unspecified: Secondary | ICD-10-CM | POA: Diagnosis not present

## 2020-03-18 NOTE — Progress Notes (Signed)
Patient's left elbow is red, sore to touch. Ice pack applied with relief per patient. MD made aware. Will continue to monitor.

## 2020-03-18 NOTE — Progress Notes (Signed)
Patient seems to be very uncomfortable this day. PRN pain medications are in use. Lidocaine path was applied. Patient visibly uncomfortable, offer to reposition patient declined. Will continue to monitor pain and continue with current pain management.

## 2020-03-18 NOTE — Progress Notes (Signed)
PROGRESS NOTE  [name] OBS:[id number] DOB: 24-Jun-1934 DOA: 03/11/2020 PCP: Caren Macadam, MD   LOS: 4 days   Brief narrative:  [name] is a 86 y.o. female with medical history significant of asthma, purpura, hypothyroidism presented to hospital with cough and fatigue for last 10 days.  Patient was diagnosed with Covid infection almost 10 days back and was sent home with recommendation for isolation.  At that time, she also had symptomatic anemia but did not wish to proceed with further evaluation.  She was advised to follow-up with GI as outpatient.  Patient continued to have cough, fatigue and weakness her primary care provider advised her to come to the hospital.  In the ED, chest x-ray was noted to be clear.  She was found to have hemoglobin of 7.6 and FOBT was positive.  GI was consulted but patient denied further work-up.  Hospitalist team was consulted for admission to the hospital for Covid infection and anemia.  During hospitalization, patient continues to feel poorly with generalized weakness, fatigue and generalized body ache and pain.  She was evaluated by physical therapy who recommended skilled nursing facility placement.  Assessment/Plan:  Principal Problem:   COVID-19 virus infection Active Problems:   Hypothyroidism   Essential hypertension   GIB (gastrointestinal bleeding)  COVID 19 infection Patient was first diagnosed with positive on 03/04/2020.  Unvaccinated patient.   Continue inhaler, antitussives.   Chest x-ray without infiltrate.  Currently on supportive care.  Received remdesivir and has completed 3-day course.  Patient is not hypoxic, so steroids have not been given.  COVID-19 Labs  Recent Labs    03/16/20 0303  DDIMER 1.05*  FERRITIN 33  CRP 4.8*    Lab Results  Component Value Date   SARSCOV2NAA NEGATIVE 11/16/2019   [place] NEGATIVE 08/06/2019   SARSCOV2NAA NOT DETECTED 04/08/2019    Generalized weakness, fatigue, pain on the  back and neck, debility deconditioning.   Currently on tramadol, Vicodin.  Patient continues to have multiple complaints.  Physical therapy has seen the patient and recommended skilled nursing facility placement.  Palliative care has seen the patient as well.  Patient lives alone.  Normally was doing ADLs at home.  Patient used to be on prednisone in the past for ulcerative colitis but is currently not on.  Continue K pad, Lidoderm patch.  Patient was encouraged to use supportive care as necessary.  Bilateral leg pain.  Ultrasound of the bilateral lower extremity was negative for DVT.  Still complains of pain in the legs.  GI bleed, fecal occult blood positive with acute on chronic symptomatic anemia Secondary to chronic active ulcerative colitis. Status post 1 unit of packed RBC transfusion.  History of flexible sigmoidoscopy 11/2019.  . Doing well on mesalamine at home.  Latest hemoglobin of 8.6.  Check hemoglobin in a.m.  CBC Latest Ref Rng & Units 03/16/2020 03/15/2020 03/14/2020  WBC 4.0 - 10.5 K/uL 7.1 8.5 11.0(H)  Hemoglobin 12.0 - 15.0 g/dL 8.6(L) 8.9(L) 10.1(L)  Hematocrit 36.0 - 46.0 % 26.9(L) 27.8(L) 30.4(L)  Platelets 150 - 400 K/uL 443(H) 444(H) 465(H)    Hypothyroidism Continue synthroid.  Hypokalemia.  Replenished, check levels in a.m.  Hypophosphatemia replenished.  Purpura    continue outpt follow up.  Goals of care seen by palliative care.   Continue current level of treatment.  Patient is DNR.  DVT prophylaxis: enoxaparin (LOVENOX) injection 40 mg Start: 03/14/20 1445 SCDs Start: 03/14/20 1429 SCDs Start: 03/11/20 2003  Code Status:  DNR  Family Communication:  None today.  Status is: Inpatient  The patient iso inpatient because: IV treatments appropriate due to intensity of illness or inability to take PO and Inpatient level of care appropriate due to severity of illness, awaiting skilled nursing facility placement  Dispo: The patient is from: Home               Anticipated d/c is skilled nursing facility              Anticipated d/c date is: 1 to 2 days              Patient currently is medically stable to d/c.   Difficult to place patient No  Consultants:  None  Procedures:  PRBC transfusion 1 unit  Anti-infectives:  . Remdesivir 2/3>2/5  Subjective:  Today, patient was seen and examined at bedside.  Complains of not feeling well today with some arm pain from IV site.  Has overall fatigue weakness in her body  Objective: Vitals:   03/17/20 2046 03/18/20 0444  BP: 133/67 128/69  Pulse: 81 74  Resp: 18 18  Temp: 99.1 F (37.3 C) 99.1 F (37.3 C)  SpO2: 96% 97%    Intake/Output Summary (Last 24 hours) at 03/18/2020 1201 Last data filed at 03/18/2020 0910 Gross per 24 hour  Intake 805 ml  Output 2175 ml  Net -1370 ml   Filed Weights   03/11/20 1255  Weight: 63.5 kg   Body mass index is 23.3 kg/m.   Physical Exam: General: Alert awake and communicative but anxious, thinly built, frail, chronically ill female. HENT:   As noted.  Oral mucosa is moist.  Chest:  Clear breath sounds.  Diminished breath sounds bilaterally. No crackles or wheezes.  CVS: S1 &S2 heard. No murmur.  Regular rate and rhythm. Abdomen: Soft, nontender, nondistended.  Bowel sounds are heard.   Extremities: No cyanosis, clubbing or edema.  Peripheral pulses are palpable.  Moves all extremities, generalized weakness.  Tenderness on palpation of the lower extremities Psych: Alert, awake and communicative, anxious, CNS:  No cranial nerve deficits.  Generalized weakness of the extremities. Skin: Warm and dry.  No rashes noted.  Data Review: I have personally reviewed the following laboratory data and studies,  CBC: Recent Labs  Lab 03/12/20 0521 03/13/20 0339 03/14/20 0416 03/15/20 0355 03/16/20 0303  WBC 6.7 7.8 11.0* 8.5 7.1  NEUTROABS 2.8 4.3 7.8* 5.5 4.5  HGB 9.3* 9.5* 10.1* 8.9* 8.6*  HCT 29.0* 28.9* 30.4* 27.8* 26.9*  MCV 93.9 92.3 93.5  95.2 93.4  PLT 472* 485* 465* 444* 540*   Basic Metabolic Panel: Recent Labs  Lab 03/12/20 0521 03/13/20 0339 03/14/20 0416 03/15/20 0355 03/16/20 0303  NA 131* 133* 126* 133* 135  K 4.1 4.2 3.8 3.7 3.4*  CL 101 101 100 105 107  CO2 21* 24 21* 22 21*  GLUCOSE 82 81 88 83 85  BUN 19 23 24* 20 10  CREATININE 0.61 0.64 0.54 0.52 0.41*  CALCIUM 8.4* 8.7* 8.3* 7.8* 7.9*  MG 1.9 2.1 2.1 1.9 1.9  PHOS 3.5 2.8 2.8 1.8* 1.6*   Liver Function Tests: Recent Labs  Lab 03/12/20 0521 03/13/20 0339 03/14/20 0416 03/15/20 0355 03/16/20 0303  AST 23 19 21 16 16   ALT 17 16 14 12 11   ALKPHOS 43 40 43 39 39  BILITOT 0.6 0.3 0.6 0.4 0.7  PROT 5.9* 5.5* 5.6* 4.9* 4.7*  ALBUMIN 3.2* 2.9* 3.0* 2.5* 2.5*   No  results for input(s): LIPASE, AMYLASE in the last 168 hours. No results for input(s): AMMONIA in the last 168 hours. Cardiac Enzymes: No results for input(s): CKTOTAL, CKMB, CKMBINDEX, TROPONINI in the last 168 hours. BNP (last 3 results) No results for input(s): BNP in the last 8760 hours.  ProBNP (last 3 results) No results for input(s): PROBNP in the last 8760 hours.  CBG: No results for input(s): GLUCAP in the last 168 hours. No results found for this or any previous visit (from the past 240 hour(s)).   Studies: No results found.    Flora Lipps, MD  [facility] 03/18/2020  If 7PM-7AM, please contact night-coverage

## 2020-03-18 NOTE — TOC Progression Note (Addendum)
Transition of Care [facility]) - Progression Note    Patient Details  Name: [name] MRN: [id number] Date of Birth: 04/18/1934  Transition [facility]) CM/SW [place], [place] Phone Number: 03/18/2020, 11:32 AM  Clinical Narrative:   Went over bed offers with patient, and she selected [facility].  Alerted Janie, and let her know of plan to transfer tomorrow.  Submitted insurance auth request. TOC will continue to follow during the course of hospitalization. Addendum:  Warehouse manager [phone]     Expected Discharge Plan: Skilled Nursing Facility Barriers to Discharge: Barriers Resolved  Expected Discharge Plan and Services Expected Discharge Plan: [place]   Discharge Planning Services: CM Consult Post Acute Care Choice: [place] arrangements for the past 2 months: Apartment                                       Social Determinants of Health (SDOH) Interventions    Readmission Risk Interventions No flowsheet data found.

## 2020-03-18 NOTE — Progress Notes (Signed)
Patient c/o pain on left forearm, probably from previous IV site. Redness and tender noted. Ice pack applied.

## 2020-03-18 NOTE — Plan of Care (Signed)
  Problem: Education: Goal: Knowledge of risk factors and measures for prevention of condition will improve Outcome: Progressing   Problem: Coping: Goal: Psychosocial and spiritual needs will be supported Outcome: Progressing   Problem: Respiratory: Goal: Will maintain a patent airway Outcome: Progressing Goal: Complications related to the disease process, condition or treatment will be avoided or minimized Outcome: Progressing   

## 2020-03-19 DIAGNOSIS — K922 Gastrointestinal hemorrhage, unspecified: Secondary | ICD-10-CM | POA: Diagnosis not present

## 2020-03-19 DIAGNOSIS — I10 Essential (primary) hypertension: Secondary | ICD-10-CM | POA: Diagnosis not present

## 2020-03-19 DIAGNOSIS — E039 Hypothyroidism, unspecified: Secondary | ICD-10-CM | POA: Diagnosis not present

## 2020-03-19 DIAGNOSIS — U071 COVID-19: Secondary | ICD-10-CM | POA: Diagnosis not present

## 2020-03-19 LAB — CBC
HCT: 27.2 % — ABNORMAL LOW (ref 36.0–46.0)
Hemoglobin: 8.7 g/dL — ABNORMAL LOW (ref 12.0–15.0)
MCH: 30.3 pg (ref 26.0–34.0)
MCHC: 32 g/dL (ref 30.0–36.0)
MCV: 94.8 fL (ref 80.0–100.0)
Platelets: 481 10*3/uL — ABNORMAL HIGH (ref 150–400)
RBC: 2.87 MIL/uL — ABNORMAL LOW (ref 3.87–5.11)
RDW: 14.8 % (ref 11.5–15.5)
WBC: 7 10*3/uL (ref 4.0–10.5)
nRBC: 0 % (ref 0.0–0.2)

## 2020-03-19 LAB — BASIC METABOLIC PANEL
Anion gap: 7 (ref 5–15)
BUN: 16 mg/dL (ref 8–23)
CO2: 28 mmol/L (ref 22–32)
Calcium: 8.5 mg/dL — ABNORMAL LOW (ref 8.9–10.3)
Chloride: 99 mmol/L (ref 98–111)
Creatinine, Ser: 0.52 mg/dL (ref 0.44–1.00)
GFR, Estimated: >60 mL/min (ref >60)
Glucose, Bld: 92 mg/dL (ref 70–99)
Potassium: 4.7 mmol/L (ref 3.5–5.1)
Sodium: 134 mmol/L — ABNORMAL LOW (ref 135–145)

## 2020-03-19 LAB — MAGNESIUM: Magnesium: 2.1 mg/dL (ref 1.7–2.4)

## 2020-03-19 LAB — PHOSPHORUS: Phosphorus: 3.7 mg/dL (ref 2.5–4.6)

## 2020-03-19 NOTE — Progress Notes (Signed)
Physical Therapy Treatment Patient Details Name: [name] MRN: [id number] DOB: 1935/01/29 Today's Date: 03/19/2020    History of Present Illness 86 y.o. female with medical history significant of asthma, purpura, hypothyroidism presented to hospital with cough and fatigue for last 10 days.  Patient was diagnosed with Covid infection almost 10 days back and was sent home with recommendation for isolation.  At that time, she also had symptomatic anemia but did not wish to proceed with further evaluation.  She was advised to follow-up with GI as outpatient.  Patient continued to have cough, fatigue and weakness her primary care provider advised her to come to the hospital.  In the ED, chest x-ray was noted to be clear.  She was found to have hemoglobin of 7.6 and FOBT was positive.  GI was consulted but patient denied further work-up.    PT Comments    Pt feeling "a little better" but chief c/o is her back pain.  Assisted OOB.  General bed mobility comments: self able with increased time OOB but increased assist to support b LE back to bed due to back pain.   General transfer comment: cues for hand placement and to pwer up with LEs. light assist to rise and transition to RW.  Also assisted on/off toilet as well as assist with peri care due to balance instability to self attempt. General Gait Details: pt was able to amb 16 feet to and from bathroom but required assist for stability and present with weakness.  Remained on RA with avg sats 96% and 1/4 dyspnea.   Assisted back to bed per pt request and positioned to comfort.  Follow Up Recommendations  SNF     Equipment Recommendations  None recommended by PT    Recommendations for Other Services       Precautions / Restrictions Precautions Precautions: Fall Precaution Comments: monitor sats Restrictions Weight Bearing Restrictions: No    Mobility  Bed Mobility Overal bed mobility: Needs Assistance Bed Mobility: Supine to Sit;Sit to  Supine     Supine to sit: Supervision;Min guard Sit to supine: Min guard;Min assist   General bed mobility comments: self able with increased time OOB but increased assist to support b LE back to bed due to back pain    Transfers Overall transfer level: Needs assistance Equipment used: Rolling walker (2 wheeled) Transfers: Sit to/from [facility] Sit to Stand: Min assist Stand pivot transfers: Min assist       General transfer comment: cues for hand placement and to pwer up with LEs. light assist to rise and transition to RW.  Also assisted on/off toilet as well as assist with peri care due to balance instability to self attempt.  Ambulation/Gait Ambulation/Gait assistance: Min assist Gait Distance (Feet): 16 Feet Assistive device: Rolling walker (2 wheeled) Gait Pattern/deviations: Step-to pattern;Decreased step length - right;Decreased step length - left;Trunk flexed Gait velocity: decreased   General Gait Details: pt was able to amb 16 feet to and from bathroom but required assist for stability and present with weakness.  Remained on RA with avg sats 96% and 1/4 dyspnea.   Assisted back to bed per pt request and positioned to comfort.   Stairs             Wheelchair Mobility    Modified Rankin (Stroke Patients Only)       Balance  Cognition Arousal/Alertness: Awake/alert Behavior During Therapy: WFL for tasks assessed/performed Overall Cognitive Status: Within Functional Limits for tasks assessed                                 General Comments: AXO x 3 very sweet but required some encouragement.  Her only request was to go back to bed because of her back.      Exercises      General Comments        Pertinent Vitals/Pain Pain Assessment: Faces Faces Pain Scale: Hurts little more Pain Location: back (chronic) pt stated DDD Pain Descriptors / Indicators:  Grimacing;Sore Pain Intervention(s): Monitored during session    Home Living                      Prior Function            PT Goals (current goals can now be found in the care plan section) Progress towards PT goals: Progressing toward goals    Frequency    Min 2X/week      PT Plan Current plan remains appropriate    Co-evaluation              AM-PAC PT "6 Clicks" Mobility   Outcome Measure  Help needed turning from your back to your side while in a flat bed without using bedrails?: A Little Help needed moving from lying on your back to sitting on the side of a flat bed without using bedrails?: A Little Help needed moving to and from a bed to a chair (including a wheelchair)?: A Little Help needed standing up from a chair using your arms (e.g., wheelchair or bedside chair)?: A Little Help needed to walk in hospital room?: A Little Help needed climbing 3-5 steps with a railing? : A Lot 6 Click Score: 17    End of Session Equipment Utilized During Treatment: Gait belt Activity Tolerance: Patient limited by fatigue Patient left: in bed;with call bell/phone within reach;with bed alarm set Nurse Communication: Mobility status PT Visit Diagnosis: Other abnormalities of gait and mobility (R26.89);Unsteadiness on feet (R26.81);Muscle weakness (generalized) (M62.81)     Time: 8250-5397 PT Time Calculation (min) (ACUTE ONLY): 25 min  Charges:  $Gait Training: 8-22 mins $Therapeutic Activity: 8-22 mins                     Rica Koyanagi  PTA Acute  Rehabilitation Services Pager      [phone] Office      [phone]

## 2020-03-19 NOTE — Progress Notes (Signed)
PROGRESS NOTE  [name]:[id number] DOB: [birth year] DOA: 03/11/2020 PCP: Caren Macadam, MD   LOS: 5 days   Brief narrative:  [name] is a 86 y.o. female with medical history significant of asthma, purpura, hypothyroidism presented to hospital with cough and fatigue for last 10 days.  Patient was diagnosed with Covid infection almost 10 days back and was sent home with recommendation for isolation.  At that time, she also had symptomatic anemia but did not wish to proceed with further evaluation.  She was advised to follow-up with GI as outpatient.  Patient continued to have cough, fatigue and weakness her primary care provider advised her to come to the hospital.  In the ED, chest x-ray was noted to be clear.  She was found to have hemoglobin of 7.6 and FOBT was positive.  GI was consulted but patient denied further work-up.  Hospitalist team was consulted for admission to the hospital for Covid infection and anemia.  During hospitalization, patient was evaluated by physical therapy who recommended skilled nursing facility placement.  Assessment/Plan:  Principal Problem:   COVID-19 virus infection Active Problems:   Hypothyroidism   Essential hypertension   GIB (gastrointestinal bleeding)  COVID 19 infection Patient was first diagnosed with positive on 03/04/2020.  Unvaccinated patient.   Continue inhaler, antitussives.   Chest x-ray without infiltrate.  Currently on supportive care.  Received remdesivir and has completed 3-day course.  Patient is not hypoxic, so steroids have not been given.  COVID-19 Labs  No results for input(s): DDIMER, FERRITIN, LDH, CRP in the last 72 hours.  Lab Results  Component Value Date   SARSCOV2NAA NEGATIVE 11/16/2019   [place] NEGATIVE 08/06/2019   SARSCOV2NAA NOT DETECTED 04/08/2019    Generalized weakness, fatigue, pain on the back and neck, debility deconditioning.   Currently on tramadol, Vicodin.  Patient continues to have  multiple complaints.  Physical therapy has seen the patient and recommended skilled nursing facility placement.  Palliative care has seen the patient as well.  Patient lives alone.  Normally was doing ADLs at home.  Patient used to be on prednisone in the past for ulcerative colitis but is currently not on.  Continue K pad, Lidoderm patch as needed  Bilateral leg pain.  Ultrasound of the bilateral lower extremity was negative for DVT.    GI bleed, fecal occult blood positive with acute on chronic symptomatic anemia Secondary to chronic active ulcerative colitis.  Status post 1 unit of packed RBC transfusion.  History of flexible sigmoidoscopy 11/2019.  Doing well on mesalamine at home.  Latest hemoglobin of 8.6.  Check hemoglobin in a.m.  CBC Latest Ref Rng & Units 03/19/2020 03/16/2020 03/15/2020  WBC 4.0 - 10.5 K/uL 7.0 7.1 8.5  Hemoglobin 12.0 - 15.0 g/dL 8.7(L) 8.6(L) 8.9(L)  Hematocrit 36.0 - 46.0 % 27.2(L) 26.9(L) 27.8(L)  Platelets 150 - 400 K/uL 481(H) 443(H) 444(H)    Hypothyroidism Continue synthroid.  Hypokalemia.  Replenished  Hypophosphatemia replenished.  Purpura    continue outpt follow up.  Goals of care seen by palliative care.   Continue current level of treatment.  Patient is DNR.  DVT prophylaxis: enoxaparin (LOVENOX) injection 40 mg Start: 03/14/20 1445 SCDs Start: 03/14/20 1429 SCDs Start: 03/11/20 2003  Code Status: DNR  Family Communication:  None today.  Status is: Inpatient  The patient iso inpatient because: IV treatments appropriate due to intensity of illness or inability to take PO and Inpatient level of care appropriate due to severity  of illness, awaiting skilled nursing facility placement  Dispo: The patient is from: Home              Anticipated d/c is  Skilled nursing facility              Anticipated d/c date is: likely tomorrow if a bed is available.               Patient currently is medically stable to d/c.   Difficult to place patient  No  Consultants:  None  Procedures:  PRBC transfusion 1 unit  Anti-infectives:  . Remdesivir 2/3>2/5  Subjective:  Today, patient was seen and examined at bedside.  No interval complains.  Complains of nonspecific weakness and pain in department  Objective: Vitals:   03/18/20 2008 03/19/20 0415  BP: (!) 156/79 134/64  Pulse: 97 78  Resp: 18 18  Temp: 97.6 F (36.4 C) 98.4 F (36.9 C)  SpO2: 96% 95%    Intake/Output Summary (Last 24 hours) at 03/19/2020 1316 Last data filed at 03/19/2020 0420 Gross per 24 hour  Intake 547 ml  Output 700 ml  Net -153 ml   Filed Weights   03/11/20 1255  Weight: 63.5 kg   Body mass index is 23.3 kg/m.   Physical Exam:  General: Alert awake and communicative but more calm today thinly built, frail, chronically ill female. HENT:    Oral mucosa is moist.  Mild pallor noted. Chest:  Clear breath sounds.  Diminished breath sounds bilaterally. No crackles or wheezes.  CVS: S1 &S2 heard. No murmur.  Regular rate and rhythm. Abdomen: Soft, nontender, nondistended.  Bowel sounds are heard.   Extremities: No cyanosis, clubbing or edema.  Peripheral pulses are palpable.  Moves all extremities, generalized weakness.  Tenderness on palpation of the lower extremities, nonspecific Psych: Alert, awake and communicative, more calm today CNS:  No cranial nerve deficits.  Generalized weakness of the extremities. Skin: Warm and dry.  No rashes noted.  Data Review: I have personally reviewed the following laboratory data and studies,  CBC: Recent Labs  Lab 03/13/20 0339 03/14/20 0416 03/15/20 0355 03/16/20 0303 03/19/20 0350  WBC 7.8 11.0* 8.5 7.1 7.0  NEUTROABS 4.3 7.8* 5.5 4.5  --   HGB 9.5* 10.1* 8.9* 8.6* 8.7*  HCT 28.9* 30.4* 27.8* 26.9* 27.2*  MCV 92.3 93.5 95.2 93.4 94.8  PLT 485* 465* 444* 443* 915*   Basic Metabolic Panel: Recent Labs  Lab 03/13/20 0339 03/14/20 0416 03/15/20 0355 03/16/20 0303 03/19/20 0350  NA 133*  126* 133* 135 134*  K 4.2 3.8 3.7 3.4* 4.7  CL 101 100 105 107 99  CO2 24 21* 22 21* 28  GLUCOSE 81 88 83 85 92  BUN 23 24* 20 10 16   CREATININE 0.64 0.54 0.52 0.41* 0.52  CALCIUM 8.7* 8.3* 7.8* 7.9* 8.5*  MG 2.1 2.1 1.9 1.9 2.1  PHOS 2.8 2.8 1.8* 1.6* 3.7   Liver Function Tests: Recent Labs  Lab 03/13/20 0339 03/14/20 0416 03/15/20 0355 03/16/20 0303  AST 19 21 16 16   ALT 16 14 12 11   ALKPHOS 40 43 39 39  BILITOT 0.3 0.6 0.4 0.7  PROT 5.5* 5.6* 4.9* 4.7*  ALBUMIN 2.9* 3.0* 2.5* 2.5*   No results for input(s): LIPASE, AMYLASE in the last 168 hours. No results for input(s): AMMONIA in the last 168 hours. Cardiac Enzymes: No results for input(s): CKTOTAL, CKMB, CKMBINDEX, TROPONINI in the last 168 hours. BNP (last 3 results) No results  for input(s): BNP in the last 8760 hours.  ProBNP (last 3 results) No results for input(s): PROBNP in the last 8760 hours.  CBG: No results for input(s): GLUCAP in the last 168 hours. No results found for this or any previous visit (from the past 240 hour(s)).   Studies: No results found.    Flora Lipps, MD  [facility] 03/19/2020  If 7PM-7AM, please contact night-coverage

## 2020-03-19 NOTE — TOC Progression Note (Signed)
Transition of Care [facility]) - Progression Note    Patient Details  Name: [name] MRN: [id number] Date of Birth: 01/27/1935  Transition of Care [facility]) CM/SW [place],  Phone Number: 03/19/2020, 11:32 AM  Clinical Narrative:   [facility] will have bed tomorrow for patient.  Updated insurance-dates of approval changed to 2/11 through 2/15.  Authorization number [id number]. TOC will continue to follow during the course of hospitalization.     Expected Discharge Plan: Skilled Nursing Facility Barriers to Discharge: Barriers Resolved  Expected Discharge Plan and Services Expected Discharge Plan: [place]   Discharge Planning Services: CM Consult Post Acute Care Choice: [place] arrangements for the past 2 months: Apartment                                       Social Determinants of Health (SDOH) Interventions    Readmission Risk Interventions No flowsheet data found.

## 2020-03-20 DIAGNOSIS — M810 Age-related osteoporosis without current pathological fracture: Secondary | ICD-10-CM | POA: Diagnosis not present

## 2020-03-20 DIAGNOSIS — J45909 Unspecified asthma, uncomplicated: Secondary | ICD-10-CM | POA: Diagnosis not present

## 2020-03-20 DIAGNOSIS — D692 Other nonthrombocytopenic purpura: Secondary | ICD-10-CM | POA: Diagnosis not present

## 2020-03-20 DIAGNOSIS — M6259 Muscle wasting and atrophy, not elsewhere classified, multiple sites: Secondary | ICD-10-CM | POA: Diagnosis not present

## 2020-03-20 DIAGNOSIS — K5190 Ulcerative colitis, unspecified, without complications: Secondary | ICD-10-CM | POA: Diagnosis not present

## 2020-03-20 DIAGNOSIS — Z741 Need for assistance with personal care: Secondary | ICD-10-CM | POA: Diagnosis not present

## 2020-03-20 DIAGNOSIS — D649 Anemia, unspecified: Secondary | ICD-10-CM | POA: Diagnosis not present

## 2020-03-20 DIAGNOSIS — J309 Allergic rhinitis, unspecified: Secondary | ICD-10-CM | POA: Diagnosis not present

## 2020-03-20 DIAGNOSIS — U071 COVID-19: Secondary | ICD-10-CM | POA: Diagnosis not present

## 2020-03-20 DIAGNOSIS — R059 Cough, unspecified: Secondary | ICD-10-CM | POA: Diagnosis not present

## 2020-03-20 DIAGNOSIS — R531 Weakness: Secondary | ICD-10-CM | POA: Diagnosis not present

## 2020-03-20 DIAGNOSIS — Z515 Encounter for palliative care: Secondary | ICD-10-CM | POA: Diagnosis not present

## 2020-03-20 DIAGNOSIS — M6281 Muscle weakness (generalized): Secondary | ICD-10-CM | POA: Diagnosis not present

## 2020-03-20 DIAGNOSIS — Z20822 Contact with and (suspected) exposure to covid-19: Secondary | ICD-10-CM | POA: Diagnosis not present

## 2020-03-20 DIAGNOSIS — K5900 Constipation, unspecified: Secondary | ICD-10-CM | POA: Diagnosis not present

## 2020-03-20 DIAGNOSIS — K51311 Ulcerative (chronic) rectosigmoiditis with rectal bleeding: Secondary | ICD-10-CM | POA: Diagnosis not present

## 2020-03-20 DIAGNOSIS — D638 Anemia in other chronic diseases classified elsewhere: Secondary | ICD-10-CM | POA: Diagnosis not present

## 2020-03-20 DIAGNOSIS — K922 Gastrointestinal hemorrhage, unspecified: Secondary | ICD-10-CM | POA: Diagnosis not present

## 2020-03-20 DIAGNOSIS — J449 Chronic obstructive pulmonary disease, unspecified: Secondary | ICD-10-CM | POA: Diagnosis not present

## 2020-03-20 DIAGNOSIS — I10 Essential (primary) hypertension: Secondary | ICD-10-CM | POA: Diagnosis not present

## 2020-03-20 DIAGNOSIS — E039 Hypothyroidism, unspecified: Secondary | ICD-10-CM | POA: Diagnosis not present

## 2020-03-20 DIAGNOSIS — K279 Peptic ulcer, site unspecified, unspecified as acute or chronic, without hemorrhage or perforation: Secondary | ICD-10-CM | POA: Diagnosis not present

## 2020-03-20 DIAGNOSIS — K219 Gastro-esophageal reflux disease without esophagitis: Secondary | ICD-10-CM | POA: Diagnosis not present

## 2020-03-20 DIAGNOSIS — Z8616 Personal history of COVID-19: Secondary | ICD-10-CM | POA: Diagnosis not present

## 2020-03-20 MED ORDER — ONDANSETRON HCL 4 MG PO TABS: 4.0000 mg | ORAL_TABLET | Freq: Four times a day (QID) | ORAL | Status: DC | PRN

## 2020-03-20 MED ORDER — MAGNESIUM OXIDE 400 (241.3 MG) MG PO TABS: 400.0000 mg | ORAL_TABLET | Freq: Every day | ORAL | Status: DC

## 2020-03-20 MED ORDER — IPRATROPIUM-ALBUTEROL 20-100 MCG/ACT IN AERS: 1.0000 | INHALATION_SPRAY | Freq: Four times a day (QID) | RESPIRATORY_TRACT | Status: DC | PRN

## 2020-03-20 MED ORDER — TRAMADOL HCL 50 MG PO TABS
50.0000 mg | ORAL_TABLET | Freq: Two times a day (BID) | ORAL | 0 refills | Status: DC | PRN
Start: 2020-03-20 — End: 2020-04-17

## 2020-03-20 NOTE — Care Management Important Message (Signed)
Medicare IM printed for [facility] to give to the patient.

## 2020-03-20 NOTE — Discharge Summary (Signed)
Physician Discharge Summary  CHRISSA MAHAR [name]:[id number] DOB: 01/15/1935 DOA: 03/11/2020  PCP: Wynn Banker, MD  Admit date: 03/11/2020 Discharge date: 03/20/2020  Admitted From: Home  Discharge disposition: SNF   Recommendations for Outpatient Follow-Up:   . Follow up with your primary care provider at the SNF in 3-5 days . Check CBC, BMP, magnesium in the next visit . Patient has ulcerative colitis on mesalamine.  Recommend follow-up with her GI specialist as outpatient. . Patient has been prescribed tramadol for nonspecific multisite pain.  She would benefit from ongoing physical therapy   Discharge Diagnosis:   Principal Problem:   COVID-19 virus infection Active Problems:   Hypothyroidism   Essential hypertension   GIB (gastrointestinal bleeding)   Discharge Condition: Improved.  Diet recommendation: Low sodium, heart healthy.  .  Wound care: None.  Code status: DNR   History of Present Illness:   Margaret Hubbartt Mooreis a 86 y.o.femalewith medical history significant ofasthma, purpura, hypothyroidism presented to hospital with cough and fatigue for last 10 days.  Patient was diagnosed with Covid infection almost 10 days back and was sent home with recommendation for isolation.  At that time, she also had symptomatic anemia but did not wish to proceed with further evaluation.  She was advised to follow-up with GI as outpatient.  Patient continued to have cough, fatigue and weakness her primary care provider advised her to come to the hospital.  In the ED, chest x-ray was noted to be clear.  She was found to have hemoglobin of 7.6 and FOBT was positive.  GI was consulted but patient denied further work-up.  Hospitalist team was consulted for admission to the hospital for Covid infection and anemia.  Hospital Course:   Following conditions were addressed during hospitalization as listed below,  COVID 19 infection Patient was first diagnosed with positive on  03/04/2020.  Unvaccinated patient.  Does not need further isolation.  Continue inhaler, antitussives.   Chest x-ray without infiltrate.   Received remdesivir and has completed 3-day course.  Patient was not hypoxic, so steroids have not been given.  Generalized weakness, fatigue, pain on the back and neck, debility deconditioning.   Currently on tramadol, will continue on discharge..  Patient continues to have multiple complaints.  Physical therapy has seen the patient and recommended skilled nursing facility placement.  Palliative care has seen the patient as well.  Patient lives alone.  Normally was doing ADLs at home.  Patient used to be on prednisone in the past for ulcerative colitis but is currently not on.  Continue K pad, port of care as needed.  Bilateral leg pain.  Ultrasound of the bilateral lower extremity was negative for DVT.    GI bleed, fecal occult blood positive with acute on chronic symptomatic anemia Secondary to chronic active ulcerative colitis.  Status post 1 unit of packed RBC transfusion.  History of flexible sigmoidoscopy 11/2019.  Doing well on mesalamine at home.  Latest hemoglobin of 8.7.  Remained stable.  Recommend following up with GI as outpatient.   Hypothyroidism Continue synthroid.  Hypokalemia.  Replenished and improved.  Potassium prior to discharge was 4.7  Hypophosphatemia replenished.  Phosphorus prior to discharge was 3.7.  Purpura continue outpt follow up.  Disposition.  At this time, patient is stable for disposition to SNF.  Spoke with with the patient's son regarding disposition yesterday  Medical Consultants:    Palliative care  Procedures:    Transfusion of packed RBC 1 unit  Subjective:  Today, patient was seen and examined at bedside.  Denies any interval complaints.  Has nonspecific symptoms including generalized pain   Discharge Exam:   Vitals:   03/19/20 2144 03/20/20 0604  BP: (!) 128/54 (!) 130/57  Pulse: 86 74   Resp: 18 17  Temp: 99 F (37.2 C) 98.3 F (36.8 C)  SpO2: 95% 96%   Vitals:   03/19/20 0415 03/19/20 1342 03/19/20 2144 03/20/20 0604  BP: 134/64 (!) 110/51 (!) 128/54 (!) 130/57  Pulse: 78 81 86 74  Resp: 18 18 18 17   Temp: 98.4 F (36.9 C) 98.7 F (37.1 C) 99 F (37.2 C) 98.3 F (36.8 C)  TempSrc: Oral Oral Oral Oral  SpO2: 95% 97% 95% 96%  Weight:      Height:       General: Alert awake, not in obvious distress mildly anxious, thinly built, chronically ill HENT: pupils equally reacting to light, mild pallor noted oral mucosa is moist.  Chest:  Clear breath sounds.  Diminished breath sounds bilaterally. No crackles or wheezes.  CVS: S1 &S2 heard. No murmur.  Regular rate and rhythm. Abdomen: Soft, nontender, nondistended.  Bowel sounds are heard.   Extremities: No cyanosis, clubbing or edema.  Peripheral pulses are palpable.  Nonspecific tenderness over the lower extremities. Psych: Alert, awake and oriented, mildly anxious CNS:  No cranial nerve deficits.  Power equal in all extremities.  Generalized weakness of the extremities Skin: Warm and dry.  No rashes noted.  The results of significant diagnostics from this hospitalization (including imaging, microbiology, ancillary and laboratory) are listed below for reference.     Diagnostic Studies:   DG Chest Port 1 View  Result Date: 03/11/2020 CLINICAL DATA:  COVID patient. EXAM: PORTABLE CHEST 1 VIEW COMPARISON:  03/04/2020 FINDINGS: Normal heart size. Large hiatal hernia is again identified. Asymmetric elevation of right hemidiaphragm. No pleural effusion or edema. No airspace consolidation. IMPRESSION: 1. No acute cardiopulmonary abnormalities. 2. Large hiatal hernia. Electronically Signed   By: Signa Kell M.D.   On: 03/11/2020 13:53     Labs:   Basic Metabolic Panel: Recent Labs  Lab 03/14/20 0416 03/15/20 0355 03/16/20 0303 03/19/20 0350  NA 126* 133* 135 134*  K 3.8 3.7 3.4* 4.7  CL 100 105 107 99  CO2  21* 22 21* 28  GLUCOSE 88 83 85 92  BUN 24* 20 10 16   CREATININE 0.54 0.52 0.41* 0.52  CALCIUM 8.3* 7.8* 7.9* 8.5*  MG 2.1 1.9 1.9 2.1  PHOS 2.8 1.8* 1.6* 3.7   GFR Estimated Creatinine Clearance: 47.9 mL/min (by C-G formula based on SCr of 0.52 mg/dL). Liver Function Tests: Recent Labs  Lab 03/14/20 0416 03/15/20 0355 03/16/20 0303  AST 21 16 16   ALT 14 12 11   ALKPHOS 43 39 39  BILITOT 0.6 0.4 0.7  PROT 5.6* 4.9* 4.7*  ALBUMIN 3.0* 2.5* 2.5*   No results for input(s): LIPASE, AMYLASE in the last 168 hours. No results for input(s): AMMONIA in the last 168 hours. Coagulation profile No results for input(s): INR, PROTIME in the last 168 hours.  CBC: Recent Labs  Lab 03/14/20 0416 03/15/20 0355 03/16/20 0303 03/19/20 0350  WBC 11.0* 8.5 7.1 7.0  NEUTROABS 7.8* 5.5 4.5  --   HGB 10.1* 8.9* 8.6* 8.7*  HCT 30.4* 27.8* 26.9* 27.2*  MCV 93.5 95.2 93.4 94.8  PLT 465* 444* 443* 481*   Cardiac Enzymes: No results for input(s): CKTOTAL, CKMB, CKMBINDEX, TROPONINI in the last 168 hours.  BNP: Invalid input(s): POCBNP CBG: No results for input(s): GLUCAP in the last 168 hours. D-Dimer No results for input(s): DDIMER in the last 72 hours. Hgb A1c No results for input(s): HGBA1C in the last 72 hours. Lipid Profile No results for input(s): CHOL, HDL, LDLCALC, TRIG, CHOLHDL, LDLDIRECT in the last 72 hours. Thyroid function studies No results for input(s): TSH, T4TOTAL, T3FREE, THYROIDAB in the last 72 hours.  Invalid input(s): FREET3 Anemia work up No results for input(s): VITAMINB12, FOLATE, FERRITIN, TIBC, IRON, RETICCTPCT in the last 72 hours. Microbiology No results found for this or any previous visit (from the past 240 hour(s)).   Discharge Instructions:   Discharge Instructions    Diet general   Complete by: As directed    Discharge instructions   Complete by: As directed    Follow-up with your primary care provider at the skilled nursing facility in 3 to 5  days. Check blood work at that time.   Increase activity slowly   Complete by: As directed      Allergies as of 03/20/2020      Reactions   [place] Nut (berthollefia [place]) Skin Test Anaphylaxis   Fluconazole Shortness Of Breath   Metronidazole Shortness Of Breath, Nausea And Vomiting   Pepcid [famotidine] Shortness Of Breath, Other (See Comments)   Dizziness   Shellfish Allergy Anaphylaxis   Macrobid [nitrofurantoin]    REACTION: Syncope   Pantoprazole Other (See Comments)   CHEST PAIN      Medication List    TAKE these medications   acetaminophen 325 MG tablet Commonly known as: TYLENOL Take 650 mg by mouth every 4 (four) hours as needed for mild pain or headache.   E-Z Spacer inhaler Use as instructed   Flovent HFA 110 MCG/ACT inhaler Generic drug: fluticasone INHALE 1 PUFF BY MOUTH TWICE A DAY What changed: See the new instructions.   Ipratropium-Albuterol 20-100 MCG/ACT Aers respimat Commonly known as: COMBIVENT Inhale 1 puff into the lungs every 6 (six) hours as needed for wheezing.   Iron 325 (65 Fe) MG Tabs Take 1 tablet by mouth daily.   levothyroxine 75 MCG tablet Commonly known as: SYNTHROID TAKE 1 TABLET BY MOUTH 2 DAYS PER WEEK What changed: See the new instructions.   levothyroxine 50 MCG tablet Commonly known as: SYNTHROID TAKE 1 TABLET BY MOUTH EVERY DAY What changed: when to take this   magnesium oxide 400 (241.3 Mg) MG tablet Commonly known as: MAG-OX Take 1 tablet (400 mg total) by mouth daily.   mesalamine 1.2 g EC tablet Commonly known as: LIALDA TAKE 2 TABLETS BY MOUTH DAILY WITH BREAKFAST. What changed: See the new instructions.   ondansetron 4 MG tablet Commonly known as: ZOFRAN Take 1 tablet (4 mg total) by mouth every 6 (six) hours as needed for nausea.   traMADol 50 MG tablet Commonly known as: ULTRAM Take 1 tablet (50 mg total) by mouth every 12 (twelve) hours as needed for moderate pain.   VITAMIN D3 PO Take 1 tablet by  mouth 2 (two) times a week. Saturday and Sunday       Contact information for follow-up providers    Care, [facility] Follow up.   Specialty: Home Health Services Why: This is the home health agency that will provide physical therapy, RN following hospital d/c Contact information: [address] [place] [address] [phone]            Contact information for after-discharge care  Destination    [facility] Preferred SNF .   Service: Skilled Nursing Contact information: [address] [place] [address] [phone]                   Time coordinating discharge: 39 minutes  Signed:  Lequan Dobratz  [facility] 03/20/2020, 9:39 AM

## 2020-03-20 NOTE — TOC Transition Note (Addendum)
Transition of Care [facility]) - CM/SW Discharge Note   Patient Details  Name: [name] MRN: [id number] Date of Birth: [birth year]  Transition of Care [facility]) CM/SW Contact:  Trish Mage, LCSW Phone Number: 03/20/2020, 10:27 AM   Clinical Narrative:   Patient who is stable for d/c will transfer to [facility] today.  Patient and son alerted.  PTAR arranged.  Nursing, please call report to [phone] room 203.  TOC sign off.  Addendum: Patient referred to [place] for palliative follow up    Final next level of care: Skilled Nursing Facility Barriers to Discharge: Barriers Resolved   Patient Goals and CMS Choice Patient states their goals for this hospitalization and ongoing recovery are:: Go home   Choice offered to / list presented to : Patient  Discharge Placement                       Discharge Plan and Services   Discharge Planning Services: CM Consult Post Acute Care Choice: Home Health                               Social Determinants of Health (SDOH) Interventions     Readmission Risk Interventions No flowsheet data found.

## 2020-03-20 NOTE — Consult Note (Signed)
   [facility] CM Inpatient Consult   03/20/2020  [name] [birth year] [id number]   Patient chart reviewed for potential [facility] CM) services due to high unplanned readmission risk score. Patient is on the [facility] registry as a benefit of their [employer].  Per review, current disposition plan is for SNF. No [facility] CM follow up needs at this time.  Of note, [facility] Care Management services does not replace or interfere with any services that are arranged by inpatient case management or social work.  Netta Cedars, MSN, [facility] Liaison Nurse Mobile Phone [phone]  Toll free office [phone]

## 2020-03-20 NOTE — Progress Notes (Signed)
Manufacturing engineer [facility])  Hospital Liaison: RN note         Notified by [facility] manager of patient/family request for [facility] Palliative services at [facility]  after discharge.              [place] Palliative team will follow up with patient after discharge.         Please call with any hospice or palliative related questions.         Thank you for this referral.         Farrel Gordon, RN, CCM  [place] (listed on [place] under Hospice[facility])    [phone]

## 2020-03-20 NOTE — Progress Notes (Signed)
Attempted to call [facility] twice with no answer

## 2020-03-23 DIAGNOSIS — D692 Other nonthrombocytopenic purpura: Secondary | ICD-10-CM | POA: Diagnosis not present

## 2020-03-23 DIAGNOSIS — E039 Hypothyroidism, unspecified: Secondary | ICD-10-CM | POA: Diagnosis not present

## 2020-03-23 DIAGNOSIS — K5900 Constipation, unspecified: Secondary | ICD-10-CM | POA: Diagnosis not present

## 2020-03-23 DIAGNOSIS — J45909 Unspecified asthma, uncomplicated: Secondary | ICD-10-CM | POA: Diagnosis not present

## 2020-03-23 DIAGNOSIS — K279 Peptic ulcer, site unspecified, unspecified as acute or chronic, without hemorrhage or perforation: Secondary | ICD-10-CM | POA: Diagnosis not present

## 2020-03-23 DIAGNOSIS — K5190 Ulcerative colitis, unspecified, without complications: Secondary | ICD-10-CM | POA: Diagnosis not present

## 2020-03-23 DIAGNOSIS — I10 Essential (primary) hypertension: Secondary | ICD-10-CM | POA: Diagnosis not present

## 2020-03-23 DIAGNOSIS — K219 Gastro-esophageal reflux disease without esophagitis: Secondary | ICD-10-CM | POA: Diagnosis not present

## 2020-03-23 DIAGNOSIS — R059 Cough, unspecified: Secondary | ICD-10-CM | POA: Diagnosis not present

## 2020-03-23 DIAGNOSIS — J449 Chronic obstructive pulmonary disease, unspecified: Secondary | ICD-10-CM | POA: Diagnosis not present

## 2020-03-23 DIAGNOSIS — D649 Anemia, unspecified: Secondary | ICD-10-CM | POA: Diagnosis not present

## 2020-03-24 DIAGNOSIS — D649 Anemia, unspecified: Secondary | ICD-10-CM | POA: Diagnosis not present

## 2020-03-24 DIAGNOSIS — I10 Essential (primary) hypertension: Secondary | ICD-10-CM | POA: Diagnosis not present

## 2020-03-24 DIAGNOSIS — E039 Hypothyroidism, unspecified: Secondary | ICD-10-CM | POA: Diagnosis not present

## 2020-03-24 DIAGNOSIS — K5190 Ulcerative colitis, unspecified, without complications: Secondary | ICD-10-CM | POA: Diagnosis not present

## 2020-03-24 DIAGNOSIS — R059 Cough, unspecified: Secondary | ICD-10-CM | POA: Diagnosis not present

## 2020-03-24 DIAGNOSIS — J449 Chronic obstructive pulmonary disease, unspecified: Secondary | ICD-10-CM | POA: Diagnosis not present

## 2020-03-24 DIAGNOSIS — K5900 Constipation, unspecified: Secondary | ICD-10-CM | POA: Diagnosis not present

## 2020-03-25 DIAGNOSIS — Z8616 Personal history of COVID-19: Secondary | ICD-10-CM | POA: Diagnosis not present

## 2020-03-25 DIAGNOSIS — K5190 Ulcerative colitis, unspecified, without complications: Secondary | ICD-10-CM | POA: Diagnosis not present

## 2020-03-25 DIAGNOSIS — D638 Anemia in other chronic diseases classified elsewhere: Secondary | ICD-10-CM | POA: Diagnosis not present

## 2020-03-30 DIAGNOSIS — K5190 Ulcerative colitis, unspecified, without complications: Secondary | ICD-10-CM | POA: Diagnosis not present

## 2020-03-30 DIAGNOSIS — K219 Gastro-esophageal reflux disease without esophagitis: Secondary | ICD-10-CM | POA: Diagnosis not present

## 2020-03-30 DIAGNOSIS — J449 Chronic obstructive pulmonary disease, unspecified: Secondary | ICD-10-CM | POA: Diagnosis not present

## 2020-03-30 DIAGNOSIS — D692 Other nonthrombocytopenic purpura: Secondary | ICD-10-CM | POA: Diagnosis not present

## 2020-03-30 DIAGNOSIS — R059 Cough, unspecified: Secondary | ICD-10-CM | POA: Diagnosis not present

## 2020-04-02 ENCOUNTER — Non-Acute Institutional Stay: Payer: Self-pay | Admitting: Nurse Practitioner

## 2020-04-02 ENCOUNTER — Other Ambulatory Visit: Payer: Self-pay

## 2020-04-02 DIAGNOSIS — Z515 Encounter for palliative care: Secondary | ICD-10-CM | POA: Diagnosis not present

## 2020-04-02 DIAGNOSIS — R531 Weakness: Secondary | ICD-10-CM | POA: Diagnosis not present

## 2020-04-02 NOTE — Progress Notes (Signed)
[place] [facility] Consult Note Telephone: [phone]  Fax: [phone]  PATIENT NAME: [name] [address] [place] [address] [phone] (home)  DOB: 1934-06-01 MRN: [id number]  PRIMARY CARE PROVIDER:    Caren Macadam, MD,  [place] [address] [phone]  REFERRING PROVIDER:   Caren Macadam, [place],  [place] [address] [phone]  RESPONSIBLE PARTY:   Extended Emergency Contact Information Primary Emergency Contact: Altamura,[name] Address: [place] [address] [address] [place] Phone: [phone] Mobile Phone: [phone] Relation: Son  I met face to face with patient in facility.   ASSESSMENT AND RECOMMENDATIONS:   Advance Care Planning: Visit consisted of building trust and discussions on Palliative care medicine as a specialized medical care for people living with serious illness, aimed at facilitating improved quality of life through symptoms relief, assisting with advance care planning and establishing goals of care. Patient expressed appreciation for education provided on Palliative care and how it differs from Hospice service. Palliative care will continue to provide support to patient, family and the medical team. Goal of care: Goal of care is function. Patient desires to be strong enough to return to her home, and be independent. Directives: Patient's code status is DNR, she reiterated desire to not be resuscitate in the event of cardiac or respiratory arrest.  Symptom Management:  Chronic pain: patient has chronic back and neck pain, report pain controled on Tramadol 38m every 12 hrs as needed and Tylenol 6530mevery 4 hrs as needed for pain. Patient denied pain during visit today. Continue current regimen, monitor for uncontrolled pain and need for adjustment of med.  Follow up Palliative Care Visit:  Palliative care will continue to follow for complex decision making and symptom management. Return in about 4 weeks or prn.  Family /Caregiver/Community Supports: Patient lived at home independent at home before her hospitalization.  Cognitive / Functional decline: Patient awake and alert, coherent. She requires moderate assist with her ADl, ambulates with a walker. She is continent of bowel.  I spent 50 minutes providing this consultation, time includes time spent with patient/family, chart review, provider coordination, and documentation. More than 50% of the time in this consultation was spent counseling and coordinating communication.   CHIEF COMPLAINT: Initial palliative care visit  History obtained from review of EMR, discussion with facility staff and interview with patient. Records reviewed and summarized bellow.  HISTORY OF PRESENT ILLNESS:  Margaret FARNELLs a [age].o. year old female with multiple medical problems including COPD, asthma, Purpura, HTN, PVD, ulcerative rectosigmoiditis. Patient is s/p hospitalization from 03/11/2020 to 03/20/2020 for COVID 19 infection and anemia, was transfused one PRBC for Hgb 7.6, hgb 8.7 on discharge. Palliative Care was asked to follow this patient by consultation request of KoCaren MacadamMD to help address advance care planning and goals of care.   CODE STATUS: DNR  PPS: 50%  HOSPICE ELIGIBILITY/DIAGNOSIS: TBD  ROS/staff/patient   EYES: denies vision changes ENMT: denies dysphagia Cardiovascular: denies chest pain,  Pulmonary: denies acute cough, denies increased SOB Abdomen: endorses fair appetite, denies constipation, denies incontinence of bowel GU: denies dysuria, endorses incontinence of urine MSK:  denied ROM limitations Skin: denies rashes or wounds Neurological: endorses weakness, denies pain, denies insomnia Psych: Endorses positive mood Heme/lymph/immuno: denies bruises, abnormal bleeding   Physical Exam: Constitutional:  NAD General: frail appearing, thin  EYES: anicteric sclera, lids intact, no discharge  ENMT: intact hearing,oral mucous membranes moist CV:  no LE edema Pulmonary: no increased work of breathing, no cough, no audible wheezes, room air Abdomen: no ascites GU: deferred MSK: contractures of LE, ambulatory Skin: warm and dry, no rashes or wounds on visible skin Neuro: weakness, otherwise non focal Psych: non-anxious affect today, A and O x 3 Hem/lymph/immuno: no widespread bruising   PAST MEDICAL HISTORY:  Past Medical History:  Diagnosis Date   ABDOMINAL PAIN, CHRONIC 05/28/2008   ALLERGIC RHINITIS 09/21/2006   ANEMIA-NOS 09/21/2006   ASTHMA 09/21/2006   ASYMPTOMATIC POSTMENOPAUSAL STATUS 05/28/2008   Cataract    CHEST PAIN, ATYPICAL 05/28/2008   COPD (chronic obstructive pulmonary disease) ([place])    COVID-19 virus infection 02/2020   DDD (degenerative disc disease)    DIVERTICULITIS, HX OF 09/21/2006   GERD 09/21/2006   HYPERCHOLESTEROLEMIA 06/05/2009   HYPERTENSION 09/21/2006   HYPOTHYROIDISM 09/21/2006   Peptic ulcer disease    Purpura ([place])    RENAL INSUFFICIENCY 09/21/2006   Shingles    Small bowel obstruction (HCC)    Somatization disorder 04/25/2007   UNSPECIFIED URINARY CALCULUS 05/28/2008   UTI (urinary tract infection) 07/2019    SOCIAL HX:  Social History   Tobacco Use   Smoking status: Never Smoker   Smokeless tobacco: Never Used  Substance Use Topics   Alcohol use: No   FAMILY HX:  Family History  Problem Relation Age of Onset   Polycystic kidney disease Mother    Pancreatic cancer Mother    Other Father        Schamberg disease   Bladder Cancer Father    Hypertension Father    Marfan syndrome Son    Hemochromatosis Son    Cirrhosis Son    Allergic rhinitis Sister    Other Brother        bone issue as child; multiple fractures but seemed to age out of this   Hemochromatosis Cousin    Arthritis Sister    Colon cancer  Neg Hx    Esophageal cancer Neg Hx    Stomach cancer Neg Hx     ALLERGIES:  Allergies  Allergen Reactions   B[place] Nut (Berthollefia [place]) Skin Test Anaphylaxis   Fluconazole Shortness Of Breath   Metronidazole Shortness Of Breath and Nausea And Vomiting   Pepcid [Famotidine] Shortness Of Breath and Other (See Comments)    Dizziness   Shellfish Allergy Anaphylaxis   Macrobid [Nitrofurantoin]     REACTION: Syncope   Pantoprazole Other (See Comments)    CHEST PAIN     PERTINENT MEDICATIONS:  Outpatient Encounter Medications as of 04/02/2020  Medication Sig   acetaminophen (TYLENOL) 325 MG tablet Take 650 mg by mouth every 4 (four) hours as needed for mild pain or headache.    Cholecalciferol (VITAMIN D3 PO) Take 1 tablet by mouth 2 (two) times a week. Saturday and Sunday   Ferrous Sulfate (IRON) 325 (65 FE) MG TABS Take 1 tablet by mouth daily.    FLOVENT HFA 110 MCG/ACT inhaler INHALE 1 PUFF BY MOUTH TWICE A DAY (Patient taking differently: Inhale 1 puff into the lungs in the morning and at bedtime.)   Ipratropium-Albuterol (COMBIVENT) 20-100 MCG/ACT AERS respimat Inhale 1 puff into the lungs every 6 (six) hours as needed for wheezing.   levothyroxine (SYNTHROID) 50 MCG tablet TAKE 1 TABLET BY MOUTH EVERY DAY   levothyroxine (SYNTHROID) 75 MCG tablet TAKE 1 TABLET BY MOUTH 2 DAYS PER  WEEK (Patient taking differently: Take 75 mcg by mouth See admin instructions. Saturday and Sunday)   magnesium oxide (MAG-OX) 400 (241.3 Mg) MG tablet Take 1 tablet (400 mg total) by mouth daily.   mesalamine (LIALDA) 1.2 g EC tablet TAKE 2 TABLETS BY MOUTH DAILY WITH BREAKFAST. (Patient taking differently: Take 2.4 g by mouth daily with breakfast.)   ondansetron (ZOFRAN) 4 MG tablet Take 1 tablet (4 mg total) by mouth every 6 (six) hours as needed for nausea.   Spacer/Aero-Holding Chambers (E-Z SPACER) inhaler Use as instructed   traMADol (ULTRAM) 50 MG tablet Take 1 tablet (50 mg  total) by mouth every 12 (twelve) hours as needed for moderate pain.   No facility-administered encounter medications on file as of 04/02/2020.    Thank you for the opportunity to participate in the care of Ms,. The palliative care team will continue to follow. Please call our office at [phone] if we can be of additional assistance.  Alba Destine, NP , DNP, AGPCNP-BC

## 2020-04-08 NOTE — Telephone Encounter (Signed)
The patient's son called to let Dr. Ethlyn Gallery know that the patient is now home from Rehab and to let Dr. Ethlyn Gallery know that they did change her one of her medication. He didn't know if she needed to make an appointment for her to follow up with Dr. Ethlyn Gallery or just let Dr. Inocente Salles know that the patient was home.  Please advise

## 2020-04-09 ENCOUNTER — Telehealth (INDEPENDENT_AMBULATORY_CARE_PROVIDER_SITE_OTHER): Payer: Medicare Other | Admitting: Family Medicine

## 2020-04-09 DIAGNOSIS — R300 Dysuria: Secondary | ICD-10-CM

## 2020-04-09 MED ORDER — CIPROFLOXACIN HCL 250 MG PO TABS: 250.0000 mg | ORAL_TABLET | Freq: Two times a day (BID) | ORAL | 0 refills | Status: DC

## 2020-04-09 NOTE — Telephone Encounter (Signed)
Message sent to PCP as next opening available is on 4/1.

## 2020-04-09 NOTE — Progress Notes (Signed)
Virtual Visit via Telephone Note  I connected with [name] on 04/09/20 at  1:20 PM EST by telephone and verified that I am speaking with the correct person using two identifiers.   I discussed the limitations, risks, security and privacy concerns of performing an evaluation and management service by telephone and the availability of in person appointments. I also discussed with the patient that there may be a patient responsible charge related to this service. The patient expressed understanding and agreed to proceed.  Location patient: home, [place] Location provider: work or home office Participants present for the call: patient, provider Patient did not have a visit with me in the prior 7 days to address this/these issue(s).   History of Present Illness:  Acute telemedicine visit for dysuria: -Onset: yesterday -Symptoms include: frequency, urgency, dysuria, cloudy urine, discomfort in the low back and over bladder and she thinks over the R flank as well -Denies: fevers, NVD, blood in the urine, malaise above baseline -recently in rehab center for several weeks after a hospitalization for covid; she had a UTI in the rehab center that son says was treated with amoxicillin and resolved prior to coming home -on review of labs cr was 0.52 on most recent labs -Pertinent past medical history: reports hx of UTI and kidney infections; report cipro has worked well for her in the past   Observations/Objective: Patient sounds cheerful and well on the phone. I do not appreciate any SOB. Speech and thought processing are grossly intact. Patient reported vitals:  Assessment and Plan:  Dysuria  -we discussed possible serious and likely etiologies, options for evaluation and workup, limitations of telemedicine visit vs in person visit, treatment, treatment risks and precautions. Pt prefers to treat via telemedicine empirically rather than in person at this moment.  Had a lengthy  discussion with patient and son about possible etiologies, options for evaluation and treatment.  They prefer to try empiric treatment with Cipro, as this has worked well for her in the past.  Did discuss extensive risks with this medication.  They agreed to seek in person care with primary care office or at urgent care promptly if symptoms worsen or if she is not improving on the medication. Scheduled follow up with PCP offered: Sent message to schedulers at PCP office to assist with follow-up, given her recent rehab stay. Advised to seek prompt in person care if worsening, new symptoms arise, or if is not improving with treatment. Discussed options for inperson care if PCP office not available. Did let this patient know that I only do telemedicine on Tuesdays and Thursdays for [place]. Advised to schedule follow up visit with PCP or UCC if any further questions or concerns to avoid delays in care.   I discussed the assessment and treatment plan with the patient. The patient was provided an opportunity to ask questions and all were answered. The patient agreed with the plan and demonstrated an understanding of the instructions.  Follow Up Instructions:  I did not refer this patient for an OV with me in the next 24 hours for this/these issue(s).  I discussed the assessment and treatment plan with the patient. The patient was provided an opportunity to ask questions and all were answered. The patient agreed with the plan and demonstrated an understanding of the instructions.   I spent 24 minutes on the date of this visit in the care of this patient. See summary of tasks completed to properly  care for this patient in the detailed notes above which also included counseling of above, review of PMH, medications, allergies, evaluation of the patient and ordering and/or  instructing patient on testing and care options.     Lucretia Kern, DO

## 2020-04-09 NOTE — Telephone Encounter (Signed)
Please make sure our med list is updated with what they have at home. Yes I would like a hospital follow up visit. Would prefer in person if possible. Make sure they are set with needs at home until visit (ie home health if needed)

## 2020-04-09 NOTE — Telephone Encounter (Signed)
Please add in at 1:30 on 3/21

## 2020-04-09 NOTE — Patient Instructions (Signed)
-I sent the medication(s) we discussed to your pharmacy: Meds ordered this encounter  Medications  . ciprofloxacin (CIPRO) 250 MG tablet    Sig: Take 1 tablet (250 mg total) by mouth 2 (two) times daily.    Dispense:  10 tablet    Refill:  0     I hope you are feeling better soon!  Seek in person care promptly if your symptoms worsen, new concerns arise or you are not improving with treatment over the next 1-2 days.   It was nice to meet you today. I help [facility] out with telemedicine visits on Tuesdays and Thursdays and am available for visits on those days. If you have any concerns or questions following this visit please schedule a follow up visit with your Primary Care doctor or seek care at a local urgent care clinic to avoid delays in care.    Ciprofloxacin tablets What is this medicine? CIPROFLOXACIN (sip roe FLOX a sin) is a quinolone antibiotic. It is used to treat certain kinds of bacterial infections. It will not work for colds, flu, or other viral infections. This medicine may be used for other purposes; ask your health care provider or pharmacist if you have questions. COMMON BRAND NAME(S): Cipro What should I tell my health care provider before I take this medicine? They need to know if you have any of these conditions:  bone problems  diabetes  heart disease  high blood pressure  history of irregular heartbeat  history of low levels of potassium in the blood  joint problems  kidney disease  liver disease  mental illness  myasthenia gravis  seizures  tendon problems  tingling of the fingers or toes, or other nerve disorder  an unusual or allergic reaction to ciprofloxacin, other antibiotics or medicines, foods, dyes, or preservatives  pregnant or trying to get pregnant  breast-feeding How should I use this medicine? Take this medicine by mouth with a full glass of water. Follow the directions on the prescription label. You can take it with  or without food. If it upsets your stomach, take it with food. Take your medicine at regular intervals. Do not take your medicine more often than directed. Take all of your medicine as directed even if you think you are better. Do not skip doses or stop your medicine early. Avoid antacids, aluminum, calcium, iron, magnesium, and zinc products for 6 hours before and 2 hours after taking a dose of this medicine. A special MedGuide will be given to you by the pharmacist with each prescription and refill. Be sure to read this information carefully each time. Talk to your pediatrician regarding the use of this medicine in children. Special care may be needed. Overdosage: If you think you have taken too much of this medicine contact a poison control center or emergency room at once. NOTE: This medicine is only for you. Do not share this medicine with others. What if I miss a dose? If you miss a dose, take it as soon as you can. If it is almost time for your next dose, take only that dose. Do not take double or extra doses. What may interact with this medicine? Do not take this medicine with any of the following medications:  cisapride  dronedarone  flibanserin  lomitapide  pimozide  thioridazine  tizanidine This medicine may also interact with the following medications:  antacids  birth control pills  caffeine  certain medicines for diabetes, like glipizide, glyburide, or insulin  certain medicines  that treat or prevent blood clots like warfarin  clozapine  cyclosporine  didanosine buffered tablets or powder  dofetilide  duloxetine  lanthanum carbonate  lidocaine  methotrexate  multivitamins  NSAIDS, medicines for pain and inflammation, like ibuprofen or naproxen  olanzapine  omeprazole  other medicines that prolong the QT interval (cause an abnormal heart  rhythm)  phenytoin  probenecid  ropinirole  sevelamer  sildenafil  sucralfate  theophylline  ziprasidone  zolpidem This list may not describe all possible interactions. Give your health care provider a list of all the medicines, herbs, non-prescription drugs, or dietary supplements you use. Also tell them if you smoke, drink alcohol, or use illegal drugs. Some items may interact with your medicine. What should I watch for while using this medicine? Tell your doctor or health care provider if your symptoms do not start to get better or if they get worse. This medicine may cause serious skin reactions. They can happen weeks to months after starting the medicine. Contact your health care provider right away if you notice fevers or flu-like symptoms with a rash. The rash may be red or purple and then turn into blisters or peeling of the skin. Or, you might notice a red rash with swelling of the face, lips or lymph nodes in your neck or under your arms. Do not treat diarrhea with over the counter products. Contact your doctor if you have diarrhea that lasts more than 2 days or if it is severe and watery. Check with your doctor or health care provider if you get an attack of severe diarrhea, nausea and vomiting, or if you sweat a lot. The loss of too much body fluid can make it dangerous for you to take this medicine. This medicine may increase blood sugar. Ask your health care provider if changes in diet or medicines are needed if you have diabetes. You may get drowsy or dizzy. Do not drive, use machinery, or do anything that needs mental alertness until you know how this medicine affects you. Do not sit or stand up quickly, especially if you are an older patient. This reduces the risk of dizzy or fainting spells. This medicine can make you more sensitive to the sun. Keep out of the sun. If you cannot avoid being in the sun, wear protective clothing and use sunscreen. Do not use sun lamps or  tanning beds/booths. What side effects may I notice from receiving this medicine? Side effects that you should report to your doctor or health care professional as soon as possible:  allergic reactions like skin rash or hives, swelling of the face, lips, or tongue  anxious  bloody or watery diarrhea  confusion  depressed mood  fast, irregular heartbeat  fever  hallucination, loss of contact with reality  joint, muscle, or tendon pain or swelling  loss of memory  pain, tingling, numbness in the hands or feet  redness, blistering, peeling or loosening of the skin, including inside the mouth  seizures  signs and symptoms of aortic dissection such as sudden chest, stomach, or back pain  signs and symptoms of high blood sugar such as being more thirsty or hungry or having to urinate more than normal. You may also feel very tired or have blurry vision.  signs and symptoms of liver injury like dark yellow or brown urine; general ill feeling or flu-like symptoms; light-colored stools; loss of appetite; nausea; right upper belly pain; unusually weak or tired; yellowing of the eyes or skin  signs  and symptoms of low blood sugar such as feeling anxious; confusion; dizziness; increased hunger; unusually weak or tired; sweating; shakiness; cold; irritable; headache; blurred vision; fast heartbeat; loss of consciousness; pale skin  suicidal thoughts or other mood changes  sunburn  unusually weak or tired Side effects that usually do not require medical attention (report to your doctor or health care professional if they continue or are bothersome):  dry mouth  headache  nausea  trouble sleeping This list may not describe all possible side effects. Call your doctor for medical advice about side effects. You may report side effects to FDA at 1-800-FDA-1088. Where should I keep my medicine? Keep out of the reach of children. Store at room temperature below 30 degrees C (86  degrees F). Keep container tightly closed. Throw away any unused medicine after the expiration date. NOTE: This sheet is a summary. It may not cover all possible information. If you have questions about this medicine, talk to your doctor, pharmacist, or health care provider.  2021 Elsevier/Gold Standard (2018-04-26 11:26:08)

## 2020-04-10 NOTE — Telephone Encounter (Signed)
Left a message for the pt to return my call.  

## 2020-04-10 NOTE — Telephone Encounter (Signed)
Patient called back and agreed to the appt as below to arrive at 1:15pm.

## 2020-04-13 ENCOUNTER — Telehealth: Payer: Self-pay | Admitting: Family Medicine

## 2020-04-13 DIAGNOSIS — R300 Dysuria: Secondary | ICD-10-CM

## 2020-04-13 NOTE — Telephone Encounter (Signed)
Please have them get [place] UA and urine culture. I do not want to keep treating with antibiotics without knowing for sure what we are treating. Help order/set up lab visit for this. Would prefer this info today.   Did they do bloodwork at [facility] that prompted them to change the thyroid medication dose? If so, I don't have those results but would like them. Have they resumed prior/previous dosing of medication since she has been home? I wouldn't think that thyroid med would cause urinary symptoms, but everyone does respond differently to medications. It shouldn't cause infection though.

## 2020-04-13 NOTE — Addendum Note (Signed)
Addended by: Agnes Lawrence on: 04/13/2020 03:41 PM   Modules accepted: Orders

## 2020-04-13 NOTE — Telephone Encounter (Signed)
Patient's son [name] was informed of the message below. He stated he will check with the patient and call back for a lab appt.  Also stated Ritta Slot has adjusted the patients medication as below.

## 2020-04-13 NOTE — Telephone Encounter (Signed)
The patient had a telephone visit with Dr. Maudie Mercury on 03/03 for a UTI and she has completed the antibiotics but is still having the same symptoms. Is there something else that can be called in or her?  The son was also concerned if the  levothyroxine (SYNTHROID) 50 MCG tablet - Take 1 tablet daily  levothyroxine (SYNTHROID) 75 MCG tablet - Take 1 tablet 2 days a week  Might be causing the UTI because [facility] changed her to taking the [facility] everyday   Please advise

## 2020-04-17 ENCOUNTER — Ambulatory Visit (INDEPENDENT_AMBULATORY_CARE_PROVIDER_SITE_OTHER): Payer: Medicare Other | Admitting: Family Medicine

## 2020-04-17 ENCOUNTER — Telehealth: Payer: Self-pay | Admitting: *Deleted

## 2020-04-17 ENCOUNTER — Other Ambulatory Visit: Payer: Self-pay

## 2020-04-17 ENCOUNTER — Encounter: Payer: Self-pay | Admitting: Family Medicine

## 2020-04-17 VITALS — Temp 98.1°F

## 2020-04-17 DIAGNOSIS — Z13220 Encounter for screening for lipoid disorders: Secondary | ICD-10-CM | POA: Diagnosis not present

## 2020-04-17 DIAGNOSIS — E538 Deficiency of other specified B group vitamins: Secondary | ICD-10-CM

## 2020-04-17 DIAGNOSIS — E039 Hypothyroidism, unspecified: Secondary | ICD-10-CM

## 2020-04-17 DIAGNOSIS — N390 Urinary tract infection, site not specified: Secondary | ICD-10-CM | POA: Diagnosis not present

## 2020-04-17 DIAGNOSIS — R531 Weakness: Secondary | ICD-10-CM | POA: Diagnosis not present

## 2020-04-17 DIAGNOSIS — I10 Essential (primary) hypertension: Secondary | ICD-10-CM | POA: Diagnosis not present

## 2020-04-17 DIAGNOSIS — E60 Dietary zinc deficiency: Secondary | ICD-10-CM | POA: Diagnosis not present

## 2020-04-17 DIAGNOSIS — E871 Hypo-osmolality and hyponatremia: Secondary | ICD-10-CM

## 2020-04-17 DIAGNOSIS — R7989 Other specified abnormal findings of blood chemistry: Secondary | ICD-10-CM | POA: Diagnosis not present

## 2020-04-17 DIAGNOSIS — E559 Vitamin D deficiency, unspecified: Secondary | ICD-10-CM | POA: Diagnosis not present

## 2020-04-17 DIAGNOSIS — D500 Iron deficiency anemia secondary to blood loss (chronic): Secondary | ICD-10-CM | POA: Diagnosis not present

## 2020-04-17 NOTE — Telephone Encounter (Signed)
Info added as second contact number.

## 2020-04-17 NOTE — Telephone Encounter (Signed)
I entered the son's cell number as the second number under the pt contact info.

## 2020-04-17 NOTE — Telephone Encounter (Signed)
-----   Message from Caren Macadam, MD sent at 04/17/2020  9:22 AM EST ----- Home health ordered for patient; really needs help within week. If patient doesn't answer phone; could son be called? His number is on demographics.

## 2020-04-17 NOTE — Progress Notes (Signed)
Virtual Visit via Telephone Note  I connected with [name]  on 04/17/20 at  8:30 AM EST by telephone and verified that I am speaking with the correct person using two identifiers.   I discussed the limitations, risks, security and privacy concerns of performing an evaluation and management service by telephone and the availability of in person appointments. I also discussed with the patient that there may be a patient responsible charge related to this service. The patient expressed understanding and agreed to proceed.  Location patient: home Location provider:  [facility]  [place], [place] [address]  Participants present for the call: patient, provider Patient did not have a visit in the prior 7 days to address this/these issue(s).   History of Present Illness: Feeling a little better. Still tired. Weakness is a little better. Having mysterious tooth ache, cheek bone pain. On phone with son. He states she has had a lot of tooth eval in past; these are not new symptoms.   She completed cipro for uti (found at nursing home/rehab [facility]). Thinks she had another bout of that and cipro was resent. She is feeling better in this regard now.   She was supposed to get PT set up for home, but so far no one has been able to set this up. Has been home since 2/28 and there has not been rehab so far. Son is there every day trying to take care of her and meals. She had just started therapy at [facility] when she went home. She had been sick after covid, weak from hospital and nursing home and was house bound prior to that, so condition overall is weak.   She can get up and down and out of bed and use bathroom on her own. She is afraid to use shower unless someone is there with her. She is able to walk around with cane in apartment. Still feels tired, weak, so walking is very limited. Has still had a lot of dizziness/fogginess. She does have a shower chair. They have  walker, wheelchair. She has grab bars in shower as well. She isn't changing out of pajamas daily.  Appetite has been good. Still on bland diet, soft. Was doing boost, but hasn't lately. Has carnation breakfast in morning sometimes. States that current weight is about 138. She hasn't been taking vitamins at home - magnesium, vitamin D, etc.   They aren't sure what company PT was supposed to be through. Social worker and case worker were supposed to get back with son.   Blood pressures have been ok - typical for her usually 120's/60's.   Not short of breath or wheezing. Stays raspy until mid day and then usually goes away.   Bowels are doing ok. They are formed and she is going 1-2/day. She is keeping up with fluids. Better in last 3-4 days   Observations/Objective:  Patient sounds tired, but well on phone. I do not appreciate any SOB. Speech and thought processing are grossly intact. Patient reported vitals:hasn't checked today. 171/87, HR 104  Assessment and Plan:  1. Weakness - Ambulatory referral to Home Health  2. Anemia, blood loss Will have home nurse recheck for stability. - Ambulatory referral to [facility]  3. Hyponatremia Recheck labs - Comprehensive metabolic panel; Future - Magnesium, RBC; Future  4. Hypothyroidism, unspecified type At nursing home she was switched to 55mg daily; we will recheck to determine proper dose at this point. Previously was taking 518m  5 days/week and 60mg other two days - TSH; Future  5. Essential hypertension Not currently on medications. - CBC with Differential/Platelet; Future  6. Recurrent urinary tract infection - Urinalysis with Culture Reflex; Future  7. Low vitamin D level Recheck levels.  8. Low zinc level Recheck levels.   9. Vitamin D deficiency - VITAMIN D 25 Hydroxy (Vit-D Deficiency, Fractures); Future  10. Low phosphate levels  - Phosphorus; Future  11. B12 deficiency  - Vitamin B12; Future  12.  Lipid screening  - Lipid panel; Future   Follow Up Instructions:  Pending lab results, home health evaluation/progress   [id number] [id number] [id number] 11-20 [id number] 21-30 I did not refer this patient for an OV in the next 24 hours for this/these issue(s).  I discussed the assessment and treatment plan with the patient. The patient was provided an opportunity to ask questions and all were answered. The patient agreed with the plan and demonstrated an understanding of the instructions.   The patient was advised to call back or seek an in-person evaluation if the symptoms worsen or if the condition fails to improve as anticipated.  I provided 30 minutes of non-face-to-face time during this encounter.   JMicheline Rough MD

## 2020-04-17 NOTE — Telephone Encounter (Signed)
-----   Message from Caren Macadam, MD sent at 04/17/2020  9:23 AM EST ----- Son's home number has changed (mobile is same): [phone]. I'm not sure how to change this on my side.

## 2020-04-23 ENCOUNTER — Telehealth: Payer: Self-pay | Admitting: Family Medicine

## 2020-04-23 DIAGNOSIS — E559 Vitamin D deficiency, unspecified: Secondary | ICD-10-CM | POA: Diagnosis not present

## 2020-04-23 DIAGNOSIS — U071 COVID-19: Secondary | ICD-10-CM | POA: Diagnosis not present

## 2020-04-23 DIAGNOSIS — E7800 Pure hypercholesterolemia, unspecified: Secondary | ICD-10-CM | POA: Diagnosis not present

## 2020-04-23 DIAGNOSIS — K51311 Ulcerative (chronic) rectosigmoiditis with rectal bleeding: Secondary | ICD-10-CM | POA: Diagnosis not present

## 2020-04-23 DIAGNOSIS — K922 Gastrointestinal hemorrhage, unspecified: Secondary | ICD-10-CM | POA: Diagnosis not present

## 2020-04-23 DIAGNOSIS — M2550 Pain in unspecified joint: Secondary | ICD-10-CM | POA: Diagnosis not present

## 2020-04-23 DIAGNOSIS — K219 Gastro-esophageal reflux disease without esophagitis: Secondary | ICD-10-CM | POA: Diagnosis not present

## 2020-04-23 DIAGNOSIS — J449 Chronic obstructive pulmonary disease, unspecified: Secondary | ICD-10-CM | POA: Diagnosis not present

## 2020-04-23 DIAGNOSIS — K529 Noninfective gastroenteritis and colitis, unspecified: Secondary | ICD-10-CM | POA: Diagnosis not present

## 2020-04-23 DIAGNOSIS — M47819 Spondylosis without myelopathy or radiculopathy, site unspecified: Secondary | ICD-10-CM | POA: Diagnosis not present

## 2020-04-23 DIAGNOSIS — M75101 Unspecified rotator cuff tear or rupture of right shoulder, not specified as traumatic: Secondary | ICD-10-CM | POA: Diagnosis not present

## 2020-04-23 DIAGNOSIS — E039 Hypothyroidism, unspecified: Secondary | ICD-10-CM | POA: Diagnosis not present

## 2020-04-23 DIAGNOSIS — M810 Age-related osteoporosis without current pathological fracture: Secondary | ICD-10-CM | POA: Diagnosis not present

## 2020-04-23 DIAGNOSIS — J45909 Unspecified asthma, uncomplicated: Secondary | ICD-10-CM | POA: Diagnosis not present

## 2020-04-23 DIAGNOSIS — I10 Essential (primary) hypertension: Secondary | ICD-10-CM | POA: Diagnosis not present

## 2020-04-23 DIAGNOSIS — D509 Iron deficiency anemia, unspecified: Secondary | ICD-10-CM | POA: Diagnosis not present

## 2020-04-23 NOTE — Telephone Encounter (Signed)
[name] is calling and is requesting verbal orders for physical therapy 2 week 5 and also to confirm if patient needs  nursing visits to come see patient in the home, please advise. CB is [phone]

## 2020-04-27 ENCOUNTER — Ambulatory Visit: Payer: Medicare Other | Admitting: Family Medicine

## 2020-04-27 NOTE — Telephone Encounter (Signed)
[facility] for vergal orders; I did order nursing as well; I wanted them to get bloodwork but also assess her until she is feeling stronger.

## 2020-04-27 NOTE — Telephone Encounter (Signed)
Left a detailed message on [name]'s voicemail with the approval for orders as below.

## 2020-04-28 ENCOUNTER — Encounter (HOSPITAL_BASED_OUTPATIENT_CLINIC_OR_DEPARTMENT_OTHER): Payer: Self-pay

## 2020-04-28 ENCOUNTER — Emergency Department (HOSPITAL_BASED_OUTPATIENT_CLINIC_OR_DEPARTMENT_OTHER): Payer: Medicare Other

## 2020-04-28 ENCOUNTER — Emergency Department (HOSPITAL_BASED_OUTPATIENT_CLINIC_OR_DEPARTMENT_OTHER)
Admission: EM | Admit: 2020-04-28 | Discharge: 2020-04-28 | Disposition: A | Discharge status: Home / Self Care | Payer: Medicare Other | Attending: Emergency Medicine | Admitting: Emergency Medicine

## 2020-04-28 ENCOUNTER — Other Ambulatory Visit: Payer: Self-pay

## 2020-04-28 DIAGNOSIS — J449 Chronic obstructive pulmonary disease, unspecified: Secondary | ICD-10-CM | POA: Diagnosis not present

## 2020-04-28 DIAGNOSIS — R1032 Left lower quadrant pain: Secondary | ICD-10-CM | POA: Diagnosis present

## 2020-04-28 DIAGNOSIS — Z8616 Personal history of COVID-19: Secondary | ICD-10-CM | POA: Insufficient documentation

## 2020-04-28 DIAGNOSIS — J45909 Unspecified asthma, uncomplicated: Secondary | ICD-10-CM | POA: Diagnosis not present

## 2020-04-28 DIAGNOSIS — I10 Essential (primary) hypertension: Secondary | ICD-10-CM | POA: Diagnosis not present

## 2020-04-28 DIAGNOSIS — Z7951 Long term (current) use of inhaled steroids: Secondary | ICD-10-CM | POA: Diagnosis not present

## 2020-04-28 DIAGNOSIS — N3000 Acute cystitis without hematuria: Secondary | ICD-10-CM | POA: Diagnosis not present

## 2020-04-28 DIAGNOSIS — Z79899 Other long term (current) drug therapy: Secondary | ICD-10-CM | POA: Diagnosis not present

## 2020-04-28 DIAGNOSIS — E039 Hypothyroidism, unspecified: Secondary | ICD-10-CM | POA: Diagnosis not present

## 2020-04-28 DIAGNOSIS — R109 Unspecified abdominal pain: Secondary | ICD-10-CM | POA: Diagnosis not present

## 2020-04-28 LAB — COMPREHENSIVE METABOLIC PANEL
ALT: 9 U/L (ref 0–44)
AST: 19 U/L (ref 15–41)
Albumin: 3.9 g/dL (ref 3.5–5.0)
Alkaline Phosphatase: 43 U/L (ref 38–126)
Anion gap: 8 (ref 5–15)
BUN: 14 mg/dL (ref 8–23)
CO2: 24 mmol/L (ref 22–32)
Calcium: 9.6 mg/dL (ref 8.9–10.3)
Chloride: 100 mmol/L (ref 98–111)
Creatinine, Ser: 0.54 mg/dL (ref 0.44–1.00)
GFR, Estimated: >60 mL/min (ref >60)
Glucose, Bld: 111 mg/dL — ABNORMAL HIGH (ref 70–99)
Potassium: 4.1 mmol/L (ref 3.5–5.1)
Sodium: 132 mmol/L — ABNORMAL LOW (ref 135–145)
Total Bilirubin: 0.2 mg/dL — ABNORMAL LOW (ref 0.3–1.2)
Total Protein: 6.7 g/dL (ref 6.5–8.1)

## 2020-04-28 LAB — URINALYSIS, ROUTINE W REFLEX MICROSCOPIC
Bilirubin Urine: NEGATIVE
Glucose, UA: NEGATIVE mg/dL
Hgb urine dipstick: NEGATIVE
Ketones, ur: NEGATIVE mg/dL
Nitrite: NEGATIVE
Protein, ur: NEGATIVE mg/dL
Specific Gravity, Urine: 1.008 (ref 1.005–1.030)
WBC, UA: >50 WBC/hpf — ABNORMAL HIGH (ref 0–5)
pH: 6 (ref 5.0–8.0)

## 2020-04-28 LAB — CBC WITH DIFFERENTIAL/PLATELET
Abs Immature Granulocytes: 0.02 10*3/uL (ref 0.00–0.07)
Basophils Absolute: 0.1 10*3/uL (ref 0.0–0.1)
Basophils Relative: 1 %
Eosinophils Absolute: 0 10*3/uL (ref 0.0–0.5)
Eosinophils Relative: 0 %
HCT: 22.9 % — ABNORMAL LOW (ref 36.0–46.0)
Hemoglobin: 7.1 g/dL — ABNORMAL LOW (ref 12.0–15.0)
Immature Granulocytes: 0 %
Lymphocytes Relative: 25 %
Lymphs Abs: 1.4 10*3/uL (ref 0.7–4.0)
MCH: 27.7 pg (ref 26.0–34.0)
MCHC: 31 g/dL (ref 30.0–36.0)
MCV: 89.5 fL (ref 80.0–100.0)
Monocytes Absolute: 1.1 10*3/uL — ABNORMAL HIGH (ref 0.1–1.0)
Monocytes Relative: 19 %
Neutro Abs: 3.1 10*3/uL (ref 1.7–7.7)
Neutrophils Relative %: 55 %
Platelets: 499 10*3/uL — ABNORMAL HIGH (ref 150–400)
RBC: 2.56 MIL/uL — ABNORMAL LOW (ref 3.87–5.11)
RDW: 14.6 % (ref 11.5–15.5)
WBC: 5.6 10*3/uL (ref 4.0–10.5)
nRBC: 0 % (ref 0.0–0.2)

## 2020-04-28 LAB — OCCULT BLOOD X 1 CARD TO LAB, STOOL: Fecal Occult Bld: NEGATIVE

## 2020-04-28 LAB — LIPASE, BLOOD: Lipase: 31 U/L (ref 11–51)

## 2020-04-28 MED ORDER — SODIUM CHLORIDE 0.9 % IV SOLN
1.0000 g | Freq: Once | INTRAVENOUS | Status: AC
  Administered 2020-04-28: 1 g via INTRAVENOUS
  Filled 2020-04-28: qty 10

## 2020-04-28 MED ORDER — IOHEXOL 300 MG/ML  SOLN
75.0000 mL | Freq: Once | INTRAMUSCULAR | Status: AC | PRN
  Administered 2020-04-28: 75 mL via INTRAVENOUS

## 2020-04-28 MED ORDER — CEPHALEXIN 500 MG PO CAPS: 500.0000 mg | ORAL_CAPSULE | Freq: Two times a day (BID) | ORAL | 0 refills | Status: AC

## 2020-04-28 NOTE — ED Provider Notes (Signed)
[facility] EMERGENCY DEPT Provider Note   CSN: [id number] Arrival date & time: 04/28/20  0920     History Chief Complaint  Patient presents with  . Abdominal Pain    [name] is a 86 y.o. female.  The history is provided by the patient.  Abdominal Pain Pain location:  LLQ and suprapubic Pain quality: aching   Pain radiates to:  Does not radiate Pain severity:  Mild Onset quality:  Gradual Timing:  Intermittent Progression:  Waxing and waning Chronicity:  New Context comment:  UTI symptoms, hx of bowel surgery Relieved by:  Nothing Worsened by:  Nothing Associated symptoms: no anorexia, no belching, no chest pain, no chills, no cough, no dysuria, no fever, no hematochezia, no hematuria, no shortness of breath, no sore throat, no vaginal bleeding and no vomiting   Risk factors: multiple surgeries        Past Medical History:  Diagnosis Date  . ABDOMINAL PAIN, CHRONIC 05/28/2008  . ALLERGIC RHINITIS 09/21/2006  . ANEMIA-NOS 09/21/2006  . ASTHMA 09/21/2006  . ASYMPTOMATIC POSTMENOPAUSAL STATUS 05/28/2008  . Cataract   . CHEST PAIN, ATYPICAL 05/28/2008  . COPD (chronic obstructive pulmonary disease) ([place])   . COVID-19 virus infection 02/2020  . DDD (degenerative disc disease)   . DIVERTICULITIS, HX OF 09/21/2006  . GERD 09/21/2006  . HYPERCHOLESTEROLEMIA 06/05/2009  . HYPERTENSION 09/21/2006  . HYPOTHYROIDISM 09/21/2006  . Peptic ulcer disease   . Purpura ([place])   . RENAL INSUFFICIENCY 09/21/2006  . Shingles   . Small bowel obstruction ([place])   . Somatization disorder 04/25/2007  . UNSPECIFIED URINARY CALCULUS 05/28/2008  . UTI (urinary tract infection) 07/2019    Patient Active Problem List   Diagnosis Date Noted  . GIB (gastrointestinal bleeding) 03/11/2020  . COVID-19 virus infection 02/2020  . Chronic ulcerative rectosigmoiditis with rectal bleeding ([place])   . Rectal bleeding   . Abnormal CT scan, gastrointestinal tract   . Lower GI bleed  11/16/2019  . LLQ abdominal pain 10/31/2019  . Diverticulitis 10/31/2019  . UTI (urinary tract infection) 08/06/2019  . COPD (chronic obstructive pulmonary disease) ([place])   . Colitis   . Vitamin D deficiency 05/01/2019  . Arthralgia 08/21/2017  . Osteoporosis 11/21/2016  . Postmenopausal status 09/06/2016  . Other nonspecific abnormal finding of lung field 08/14/2015  . Hyponatremia 07/21/2014  . Menopausal state 07/18/2014  . Hematest positive stools 08/05/2013  . Anemia, blood loss 08/05/2013  . Cerumen impaction 06/03/2013  . History of adrenal disorder 10/15/2012  . Rash and nonspecific skin eruption 10/15/2012  . Iron deficiency anemia 06/20/2012  . Psychogenic polydipsia 06/20/2012  . Nonspecific abnormal electrocardiogram (ECG) (EKG) 06/20/2012  . Polyuria 05/02/2012  . Pelvic pain in female 08/16/2011  . Leg ulcer ([place]) 06/16/2010  . Routine general medical examination at a health care facility 06/11/2010  . Encounter for long-term (current) use of other medications 06/11/2010  . HYPERCHOLESTEROLEMIA 06/05/2009  . UNSPECIFIED URINARY CALCULUS 05/28/2008  . CHEST PAIN, ATYPICAL 05/28/2008  . ABDOMINAL PAIN, CHRONIC 05/28/2008  . ASYMPTOMATIC POSTMENOPAUSAL STATUS 05/28/2008  . SOMATIZATION DISORDER 04/25/2007  . Hypothyroidism 09/21/2006  . Essential hypertension 09/21/2006  . ALLERGIC RHINITIS 09/21/2006  . ASTHMA 09/21/2006  . GERD 09/21/2006  . Disorder resulting from impaired renal function 09/21/2006  . DIVERTICULITIS, HX OF 09/21/2006    Past Surgical History:  Procedure Laterality Date  . ABDOMINAL HYSTERECTOMY  1981  . acute Nephritis  1958  . APPENDECTOMY  1959  . BIOPSY  11/18/2019  Procedure: BIOPSY;  Surgeon: Ladene Artist, MD;  Location: [facility];  Service: Endoscopy;;  . CESAREAN SECTION    . CHOLECYSTECTOMY  1983  . COLON SURGERY    . DENTAL SURGERY  1970  . FLEXIBLE SIGMOIDOSCOPY N/A 11/18/2019   Procedure: FLEXIBLE SIGMOIDOSCOPY;   Surgeon: Ladene Artist, MD;  Location: [facility];  Service: Endoscopy;  Laterality: N/A;  . NASAL SINUS SURGERY  1967  . Pulmonary thrombosis    . TONSILLECTOMY    . TUBAL LIGATION       OB History   No obstetric history on file.     Family History  Problem Relation Age of Onset  . Polycystic kidney disease Mother   . Pancreatic cancer Mother   . Other Father        Schamberg disease  . Bladder Cancer Father   . Hypertension Father   . Marfan syndrome Son   . Hemochromatosis Son   . Cirrhosis Son   . Allergic rhinitis Sister   . Other Brother        bone issue as child; multiple fractures but seemed to age out of this  . Hemochromatosis Cousin   . Arthritis Sister   . Colon cancer Neg Hx   . Esophageal cancer Neg Hx   . Stomach cancer Neg Hx     Social History   Tobacco Use  . Smoking status: Never Smoker  . Smokeless tobacco: Never Used  Vaping Use  . Vaping Use: Never used  Substance Use Topics  . Alcohol use: No  . Drug use: No    Home Medications Prior to Admission medications   Medication Sig Start Date End Date Taking? Authorizing Provider  acetaminophen (TYLENOL) 325 MG tablet Take 650 mg by mouth every 4 (four) hours as needed for mild pain or headache.    Yes [provider]  cephALEXin (KEFLEX) 500 MG capsule Take 1 capsule (500 mg total) by mouth 2 (two) times daily for 7 days. 04/28/20 05/05/20 Yes Curatolo, Adam, DO  Ferrous Sulfate (IRON) 325 (65 FE) MG TABS Take 1 tablet by mouth daily.    Yes [provider]  FLOVENT HFA 110 MCG/ACT inhaler INHALE 1 PUFF BY MOUTH TWICE A DAY Patient taking differently: Inhale 1 puff into the lungs in the morning and at bedtime. 03/04/20  Yes Koberlein, Steele Berg, MD  Ipratropium-Albuterol (COMBIVENT) 20-100 MCG/ACT AERS respimat Inhale 1 puff into the lungs every 6 (six) hours as needed for wheezing. 03/20/20  Yes Pokhrel, Laxman, MD  levothyroxine (SYNTHROID) 75 MCG tablet TAKE 1 TABLET BY  MOUTH 2 DAYS PER WEEK Patient taking differently: Take 75 mcg by mouth See admin instructions. Saturday and Sunday 01/23/20  Yes Burchette, Alinda Sierras, MD  mesalamine (LIALDA) 1.2 g EC tablet TAKE 2 TABLETS BY MOUTH DAILY WITH BREAKFAST. Patient taking differently: Take 2.4 g by mouth daily with breakfast. 02/27/20  Yes Willia Craze, NP  Spacer/Aero-Holding Chambers (E-Z SPACER) inhaler Use as instructed 11/08/17  Yes Koberlein, Steele Berg, MD  Cholecalciferol (VITAMIN D3 PO) Take 1 tablet by mouth 2 (two) times a week. Saturday and Sunday    [provider]    Allergies    [place] nut (berthollefia excelsa) skin test, Fluconazole, Metronidazole, Pepcid [famotidine], Shellfish allergy, Macrobid [nitrofurantoin], and Pantoprazole  Review of Systems   Review of Systems  Constitutional: Negative for chills and fever.  HENT: Negative for ear pain and sore throat.   Eyes: Negative for pain and visual  disturbance.  Respiratory: Negative for cough and shortness of breath.   Cardiovascular: Negative for chest pain and palpitations.  Gastrointestinal: Positive for abdominal pain. Negative for anorexia, hematochezia and vomiting.  Genitourinary: Negative for dysuria, hematuria and vaginal bleeding.  Musculoskeletal: Negative for arthralgias and back pain.  Skin: Negative for color change and rash.  Neurological: Negative for seizures and syncope.  All other systems reviewed and are negative.   Physical Exam Updated Vital Signs  ED Triage Vitals  Enc Vitals Group     BP 04/28/20 0927 (!) 147/75     Pulse Rate 04/28/20 0927 (!) 101     Resp 04/28/20 0927 18     Temp 04/28/20 0927 98.2 F (36.8 C)     Temp Source 04/28/20 0927 Oral     SpO2 04/28/20 0927 100 %     Weight 04/28/20 0930 139 lb (63 kg)     Height 04/28/20 0930 5' 8"  (1.727 m)     Head Circumference --      Peak Flow --      Pain Score 04/28/20 0928 8     Pain Loc --      Pain Edu? --      Excl. in [place]? --      Physical Exam Vitals and nursing note reviewed.  Constitutional:      General: She is not in acute distress.    Appearance: She is well-developed. She is not ill-appearing.  HENT:     Head: Normocephalic and atraumatic.     Mouth/Throat:     Mouth: Mucous membranes are moist.  Eyes:     Extraocular Movements: Extraocular movements intact.     Conjunctiva/sclera: Conjunctivae normal.  Cardiovascular:     Rate and Rhythm: Normal rate and regular rhythm.     Heart sounds: Normal heart sounds. No murmur heard.   Pulmonary:     Effort: Pulmonary effort is normal. No respiratory distress.     Breath sounds: Normal breath sounds.  Abdominal:     Palpations: Abdomen is soft.     Tenderness: There is abdominal tenderness in the suprapubic area and left lower quadrant. There is no guarding or rebound.  Musculoskeletal:     Cervical back: Neck supple.  Skin:    General: Skin is warm and dry.     Capillary Refill: Capillary refill takes less than 2 seconds.  Neurological:     General: No focal deficit present.     Mental Status: She is alert.     ED Results / Procedures / Treatments   Labs (all labs ordered are listed, but only abnormal results are displayed) Labs Reviewed  CBC WITH DIFFERENTIAL/PLATELET - Abnormal; Notable for the following components:      Result Value   RBC 2.56 (*)    Hemoglobin 7.1 (*)    HCT 22.9 (*)    Platelets 499 (*)    Monocytes Absolute 1.1 (*)    All other components within normal limits  COMPREHENSIVE METABOLIC PANEL - Abnormal; Notable for the following components:   Sodium 132 (*)    Glucose, Bld 111 (*)    Total Bilirubin 0.2 (*)    All other components within normal limits  URINALYSIS, ROUTINE W REFLEX MICROSCOPIC - Abnormal; Notable for the following components:   APPearance CLOUDY (*)    Leukocytes,Ua LARGE (*)    WBC, UA >50 (*)    Bacteria, UA MANY (*)    Non Squamous Epithelial 0-5 (*)  All other components within normal  limits  URINE CULTURE  LIPASE, BLOOD  OCCULT BLOOD X 1 CARD TO LAB, STOOL  POC OCCULT BLOOD, ED    EKG None  Radiology CT ABDOMEN PELVIS W CONTRAST  Result Date: 04/28/2020 CLINICAL DATA:  86 year old female with acute abdominal pain, lower abdomen pain and dysuria for 2 days. Recently started on antibiotics for UTI. Prior appendectomy. EXAM: CT ABDOMEN AND PELVIS WITH CONTRAST TECHNIQUE: Multidetector CT imaging of the abdomen and pelvis was performed using the standard protocol following bolus administration of intravenous contrast. CONTRAST:  63m OMNIPAQUE IOHEXOL 300 MG/ML  SOLN COMPARISON:  CT Abdomen and Pelvis 11/16/2019 and earlier. FINDINGS: Lower chest: Moderate chronic gastric hiatal hernia. Rectangular tablets within the herniated stomach. No cardiomegaly or pericardial effusion. Mild lung base atelectasis or scarring is stable. Hepatobiliary: Chronically absent gallbladder. Stable bile ducts and liver. Pancreas: Negative. Spleen: Negative. Adrenals/Urinary Tract: Normal adrenal glands. Symmetric renal enhancement and contrast excretion. Both ureters are decompressed to the bladder. Stable and unremarkable urinary bladder. No urinary calculus. Stomach/Bowel: Redundant large bowel, especially the right colon where the cecum is located across midline in the left lower quadrant (ileocecal valve on series 5, image 56). Retained stool throughout much of the right colon. More decompressed distal colon. Diverticulosis of the sigmoid colon, but no convincing large bowel inflammation. No dilated small bowel. There is a side to side type small bowel anastomosis in the anterior lower abdomen on series 2, image 62. There is a rectangular tablet within the lumen here (series 5, image 59). No evidence of bowel obstruction. Intra-abdominal stomach and duodenum appear negative. No free air or free fluid. Chronic rectus muscle diastasis and postoperative changes to the ventral lower abdominal wall are  stable. Vascular/Lymphatic: Mild for age Calcified aortic atherosclerosis. Major arterial structures are patent. Portal venous system is patent. Reproductive: Surgically absent uterus. Diminutive or absent ovaries. Other: No pelvic free fluid. Musculoskeletal: Degenerative changes throughout the spine. Asymmetric right hip degeneration. No acute osseous abnormality identified. IMPRESSION: 1. No acute or inflammatory process identified in the abdomen or pelvis. 2. Redundant large bowel (cecum in the left lower quadrant) with retained stool proximally and diverticulosis distally, but no convincing bowel inflammation. 3. Moderate chronic gastric hiatal hernia. Chronic anastomosis in the distal small bowel. 4. Aortic Atherosclerosis (ICD10-I70.0). Electronically Signed   By: HGenevie AnnM.D.   On: 04/28/2020 11:25    Procedures Procedures   Medications Ordered in ED Medications  iohexol (OMNIPAQUE) 300 MG/ML solution 75 mL (75 mLs Intravenous Contrast Given 04/28/20 1046)  cefTRIAXone (ROCEPHIN) 1 g in sodium chloride 0.9 % 100 mL IVPB (0 g Intravenous Stopped 04/28/20 1142)    ED Course  I have reviewed the triage vital signs and the nursing notes.  Pertinent labs & imaging results that were available during my care of the patient were reviewed by me and considered in my medical decision making (see chart for details).    MDM Rules/Calculators/A&P                          JKENNI NEWTONis here with lower abdominal pain.  History of chronic UTIs, diverticulitis.  Concern for the same today as having some dysuria but also left lower quadrant pain.  Has been supposed to be on antibiotic for 2 days and still having discomfort.  Suspect UTI versus diverticulitis.  Jayley Hustead check basic labs.  Does not appear to be septic.  Merie Wulf get  CT scan abdomen and pelvis.  Lower concern for bowel obstruction or bowel perforation.  Hemoglobin is 7.1.  Down from 8.7 from about a month ago.  History of ulcerative colitis.  On  mesalamine.  Denies any gross melena or hematochezia but never has a history of that.  Was admitted about a month ago for anemia.  Did not have a scope at that time.  Overall she does appear to be pale.  When further history is obtained she does admit to some symptomatic anemia type symptoms although these are vague.  She has not had any passing out episodes.  She was transfused last month from a hemoglobin of 7.6 but was having dark stools at that time and Hemoccult was positive.  It appears that her baseline is around 10 or 11 but this is dating back to last year.  She is not on any blood thinners or antiplatelets.  We Johnita Palleschi get a CT scan to evaluate for diverticulitis.  She may have a UTI as well.  Hemoccult is negative.  Overall have a lower suspicion for acute GI process.  This may be more acute on chronic anemia.  She is hemodynamically stable.  Urinalysis appears consistent with infection, Alissia Lory give a dose of Rocephin.  Daniil Labarge reevaluate after CT scan and have shared decision making about possible observation stay or not.  CT scan was overall unremarkable.  Urinalysis consistent with infection.  Feeling better after IV fluids and Rocephin IV.  No concern for sepsis.  Hemoccult was negative.  Overall have a low suspicion for acute GI bleed or other acute anemia.  Likely chronic anemia but shared decision was made to hold off on admission as she is not having severe shortness of breath, chest pain, major fatigue.  She prefers to try to follow-up with primary care doctor to have hemoglobin rechecked this week.  She understands return if symptoms worsen.  At this time patient stable for discharge.  Does have home health coming this week as well and may be able to help with checking blood work.  She understands short threshold to return.  Discharged in good condition.  This chart was dictated using voice recognition software.  Despite best efforts to proofread,  errors can occur which can change the documentation  meaning.    Final Clinical Impression(s) / ED Diagnoses Final diagnoses:  Acute cystitis without hematuria    Rx / DC Orders ED Discharge Orders         Ordered    cephALEXin (KEFLEX) 500 MG capsule  2 times daily        04/28/20 1153           Griswold, Quita Skye, DO 04/28/20 1155

## 2020-04-28 NOTE — ED Notes (Signed)
Pt to CT

## 2020-04-28 NOTE — ED Triage Notes (Signed)
Lower abd pain with dysuria x 2 days.  Pt states recently was put on medications for UTI but has not finished it.  Abd pain has got increasingly worse since onset of sxs.

## 2020-04-28 NOTE — ED Notes (Signed)
Pt back from CT

## 2020-04-28 NOTE — Discharge Instructions (Signed)
Take antibiotics as prescribed.  Please follow-up with your primary care doctor to have hemoglobin rechecked here in the next 1 to 2 days.  Please return if you develop any worsening shortness of breath, chest pain or other concerning symptoms as we discussed.

## 2020-04-29 LAB — URINE CULTURE

## 2020-05-02 DIAGNOSIS — E7800 Pure hypercholesterolemia, unspecified: Secondary | ICD-10-CM | POA: Diagnosis not present

## 2020-05-02 DIAGNOSIS — K922 Gastrointestinal hemorrhage, unspecified: Secondary | ICD-10-CM | POA: Diagnosis not present

## 2020-05-02 DIAGNOSIS — K529 Noninfective gastroenteritis and colitis, unspecified: Secondary | ICD-10-CM | POA: Diagnosis not present

## 2020-05-02 DIAGNOSIS — K51311 Ulcerative (chronic) rectosigmoiditis with rectal bleeding: Secondary | ICD-10-CM | POA: Diagnosis not present

## 2020-05-02 DIAGNOSIS — M75101 Unspecified rotator cuff tear or rupture of right shoulder, not specified as traumatic: Secondary | ICD-10-CM | POA: Diagnosis not present

## 2020-05-02 DIAGNOSIS — I10 Essential (primary) hypertension: Secondary | ICD-10-CM | POA: Diagnosis not present

## 2020-05-02 DIAGNOSIS — M810 Age-related osteoporosis without current pathological fracture: Secondary | ICD-10-CM | POA: Diagnosis not present

## 2020-05-02 DIAGNOSIS — U071 COVID-19: Secondary | ICD-10-CM | POA: Diagnosis not present

## 2020-05-02 DIAGNOSIS — J45909 Unspecified asthma, uncomplicated: Secondary | ICD-10-CM | POA: Diagnosis not present

## 2020-05-02 DIAGNOSIS — M2550 Pain in unspecified joint: Secondary | ICD-10-CM | POA: Diagnosis not present

## 2020-05-02 DIAGNOSIS — J449 Chronic obstructive pulmonary disease, unspecified: Secondary | ICD-10-CM | POA: Diagnosis not present

## 2020-05-02 DIAGNOSIS — E559 Vitamin D deficiency, unspecified: Secondary | ICD-10-CM | POA: Diagnosis not present

## 2020-05-02 DIAGNOSIS — E039 Hypothyroidism, unspecified: Secondary | ICD-10-CM | POA: Diagnosis not present

## 2020-05-02 DIAGNOSIS — K219 Gastro-esophageal reflux disease without esophagitis: Secondary | ICD-10-CM | POA: Diagnosis not present

## 2020-05-02 DIAGNOSIS — D509 Iron deficiency anemia, unspecified: Secondary | ICD-10-CM | POA: Diagnosis not present

## 2020-05-02 DIAGNOSIS — M47819 Spondylosis without myelopathy or radiculopathy, site unspecified: Secondary | ICD-10-CM | POA: Diagnosis not present

## 2020-05-04 ENCOUNTER — Telehealth: Payer: Self-pay | Admitting: Family Medicine

## 2020-05-04 DIAGNOSIS — E039 Hypothyroidism, unspecified: Secondary | ICD-10-CM | POA: Diagnosis not present

## 2020-05-04 DIAGNOSIS — K529 Noninfective gastroenteritis and colitis, unspecified: Secondary | ICD-10-CM | POA: Diagnosis not present

## 2020-05-04 DIAGNOSIS — M810 Age-related osteoporosis without current pathological fracture: Secondary | ICD-10-CM | POA: Diagnosis not present

## 2020-05-04 DIAGNOSIS — K51311 Ulcerative (chronic) rectosigmoiditis with rectal bleeding: Secondary | ICD-10-CM | POA: Diagnosis not present

## 2020-05-04 DIAGNOSIS — I10 Essential (primary) hypertension: Secondary | ICD-10-CM | POA: Diagnosis not present

## 2020-05-04 DIAGNOSIS — D509 Iron deficiency anemia, unspecified: Secondary | ICD-10-CM | POA: Diagnosis not present

## 2020-05-04 DIAGNOSIS — U071 COVID-19: Secondary | ICD-10-CM | POA: Diagnosis not present

## 2020-05-04 DIAGNOSIS — K922 Gastrointestinal hemorrhage, unspecified: Secondary | ICD-10-CM | POA: Diagnosis not present

## 2020-05-04 DIAGNOSIS — K219 Gastro-esophageal reflux disease without esophagitis: Secondary | ICD-10-CM | POA: Diagnosis not present

## 2020-05-04 DIAGNOSIS — M47819 Spondylosis without myelopathy or radiculopathy, site unspecified: Secondary | ICD-10-CM | POA: Diagnosis not present

## 2020-05-04 DIAGNOSIS — E7800 Pure hypercholesterolemia, unspecified: Secondary | ICD-10-CM | POA: Diagnosis not present

## 2020-05-04 DIAGNOSIS — M2550 Pain in unspecified joint: Secondary | ICD-10-CM | POA: Diagnosis not present

## 2020-05-04 DIAGNOSIS — M75101 Unspecified rotator cuff tear or rupture of right shoulder, not specified as traumatic: Secondary | ICD-10-CM | POA: Diagnosis not present

## 2020-05-04 DIAGNOSIS — E559 Vitamin D deficiency, unspecified: Secondary | ICD-10-CM | POA: Diagnosis not present

## 2020-05-04 DIAGNOSIS — J449 Chronic obstructive pulmonary disease, unspecified: Secondary | ICD-10-CM | POA: Diagnosis not present

## 2020-05-04 DIAGNOSIS — J45909 Unspecified asthma, uncomplicated: Secondary | ICD-10-CM | POA: Diagnosis not present

## 2020-05-04 NOTE — Telephone Encounter (Signed)
Spoke with Olin Hauser and informed her of the approval for verbal orders as below.

## 2020-05-04 NOTE — Telephone Encounter (Signed)
ok 

## 2020-05-04 NOTE — Telephone Encounter (Signed)
Need orders for pt to have skilled nursing 1 time weekly for 7 weeks, then 2 PRN as needed.  Diagnosis is for Covid

## 2020-05-06 ENCOUNTER — Telehealth: Payer: Self-pay | Admitting: Family Medicine

## 2020-05-06 DIAGNOSIS — M47819 Spondylosis without myelopathy or radiculopathy, site unspecified: Secondary | ICD-10-CM | POA: Diagnosis not present

## 2020-05-06 DIAGNOSIS — U071 COVID-19: Secondary | ICD-10-CM | POA: Diagnosis not present

## 2020-05-06 DIAGNOSIS — D509 Iron deficiency anemia, unspecified: Secondary | ICD-10-CM | POA: Diagnosis not present

## 2020-05-06 DIAGNOSIS — E039 Hypothyroidism, unspecified: Secondary | ICD-10-CM | POA: Diagnosis not present

## 2020-05-06 DIAGNOSIS — M2550 Pain in unspecified joint: Secondary | ICD-10-CM | POA: Diagnosis not present

## 2020-05-06 DIAGNOSIS — K922 Gastrointestinal hemorrhage, unspecified: Secondary | ICD-10-CM | POA: Diagnosis not present

## 2020-05-06 DIAGNOSIS — J45909 Unspecified asthma, uncomplicated: Secondary | ICD-10-CM | POA: Diagnosis not present

## 2020-05-06 DIAGNOSIS — M810 Age-related osteoporosis without current pathological fracture: Secondary | ICD-10-CM | POA: Diagnosis not present

## 2020-05-06 DIAGNOSIS — E7800 Pure hypercholesterolemia, unspecified: Secondary | ICD-10-CM | POA: Diagnosis not present

## 2020-05-06 DIAGNOSIS — K219 Gastro-esophageal reflux disease without esophagitis: Secondary | ICD-10-CM | POA: Diagnosis not present

## 2020-05-06 DIAGNOSIS — I10 Essential (primary) hypertension: Secondary | ICD-10-CM | POA: Diagnosis not present

## 2020-05-06 DIAGNOSIS — E559 Vitamin D deficiency, unspecified: Secondary | ICD-10-CM | POA: Diagnosis not present

## 2020-05-06 DIAGNOSIS — K529 Noninfective gastroenteritis and colitis, unspecified: Secondary | ICD-10-CM | POA: Diagnosis not present

## 2020-05-06 DIAGNOSIS — J449 Chronic obstructive pulmonary disease, unspecified: Secondary | ICD-10-CM | POA: Diagnosis not present

## 2020-05-06 DIAGNOSIS — M75101 Unspecified rotator cuff tear or rupture of right shoulder, not specified as traumatic: Secondary | ICD-10-CM | POA: Diagnosis not present

## 2020-05-06 DIAGNOSIS — K51311 Ulcerative (chronic) rectosigmoiditis with rectal bleeding: Secondary | ICD-10-CM | POA: Diagnosis not present

## 2020-05-06 NOTE — Telephone Encounter (Signed)
There is a note on the lab results from 3/26 that were completed. I am not sure if there are still pending results because I am not sure if this lab order was mine from earlier this month? Or came from another provider - there is not an ordering name on the fax that we got and I cannot see any lab results in [facility] (which is the system name on top of results).   I had additionally ordered: phosphorus, TSH, B12 that I don't have results for (not sure if pending still?)  I would be fine with adding the above labs which include the B12 she is concerned with if that is possible and there is a contact number on the lab results sheet I gave back to you. She has had issues with deficiency in the past, so it is reasonable to recheck. Level was normal back in 11/2019 (last time I checked).

## 2020-05-06 NOTE — Telephone Encounter (Signed)
Lujean Rave is calling in with several questions 1) Is the pt needing to do anymore labs and what labs would Dr. Ethlyn Gallery like done? 2) Should the pt be on Iron or not?  3) Pt is having skin burning daily and today it is on her L ankle and foot (w/a pain level of 8 today) pt stated that it happens when she stands and does PT. 4) They are wanting to know if the pt should have her B-12 checked due to the pts skin burns on a daily basis (and they do not know the B-12 levels of the pt).  Lujean Rave stated that she can be called and if no answer may leave a detail msg on her secured voice mail.

## 2020-05-07 NOTE — Telephone Encounter (Signed)
Left a message for Lujean Rave to return my call.

## 2020-05-07 NOTE — Telephone Encounter (Signed)
Spoke with Lujean Rave and informed her of the message below. She stated [facility] is where their company sends labs for testing.  Also stated the pt was due to be seen at hematology, had to cancel due to Covid and feels this may help determine a number of her problems and anemia.  Message sent to PCP.

## 2020-05-08 NOTE — Telephone Encounter (Signed)
Are they able to check their order then through [facility]? To see if other labs I had ordered are pending? If labs are not pending; we can re-order OR hematology could order these if she is able to reschedule visit with them.

## 2020-05-11 NOTE — Telephone Encounter (Signed)
Spoke with [name] and informed her of the message below.  [name] stated the lab does not have any additional orders and she will have the tests done for phosphorus, TSH and B12 when she returns to the pts home for the next visit.

## 2020-05-11 NOTE — Telephone Encounter (Signed)
Noted. great

## 2020-05-12 DIAGNOSIS — U071 COVID-19: Secondary | ICD-10-CM | POA: Diagnosis not present

## 2020-05-12 DIAGNOSIS — E559 Vitamin D deficiency, unspecified: Secondary | ICD-10-CM | POA: Diagnosis not present

## 2020-05-12 DIAGNOSIS — M2550 Pain in unspecified joint: Secondary | ICD-10-CM | POA: Diagnosis not present

## 2020-05-12 DIAGNOSIS — K529 Noninfective gastroenteritis and colitis, unspecified: Secondary | ICD-10-CM | POA: Diagnosis not present

## 2020-05-12 DIAGNOSIS — E039 Hypothyroidism, unspecified: Secondary | ICD-10-CM | POA: Diagnosis not present

## 2020-05-12 DIAGNOSIS — M810 Age-related osteoporosis without current pathological fracture: Secondary | ICD-10-CM | POA: Diagnosis not present

## 2020-05-12 DIAGNOSIS — M47819 Spondylosis without myelopathy or radiculopathy, site unspecified: Secondary | ICD-10-CM | POA: Diagnosis not present

## 2020-05-12 DIAGNOSIS — K922 Gastrointestinal hemorrhage, unspecified: Secondary | ICD-10-CM | POA: Diagnosis not present

## 2020-05-12 DIAGNOSIS — J449 Chronic obstructive pulmonary disease, unspecified: Secondary | ICD-10-CM | POA: Diagnosis not present

## 2020-05-12 DIAGNOSIS — J45909 Unspecified asthma, uncomplicated: Secondary | ICD-10-CM | POA: Diagnosis not present

## 2020-05-12 DIAGNOSIS — E7800 Pure hypercholesterolemia, unspecified: Secondary | ICD-10-CM | POA: Diagnosis not present

## 2020-05-12 DIAGNOSIS — M75101 Unspecified rotator cuff tear or rupture of right shoulder, not specified as traumatic: Secondary | ICD-10-CM | POA: Diagnosis not present

## 2020-05-12 DIAGNOSIS — K51311 Ulcerative (chronic) rectosigmoiditis with rectal bleeding: Secondary | ICD-10-CM | POA: Diagnosis not present

## 2020-05-12 DIAGNOSIS — D509 Iron deficiency anemia, unspecified: Secondary | ICD-10-CM | POA: Diagnosis not present

## 2020-05-12 DIAGNOSIS — K219 Gastro-esophageal reflux disease without esophagitis: Secondary | ICD-10-CM | POA: Diagnosis not present

## 2020-05-12 DIAGNOSIS — I10 Essential (primary) hypertension: Secondary | ICD-10-CM | POA: Diagnosis not present

## 2020-05-13 DIAGNOSIS — E7800 Pure hypercholesterolemia, unspecified: Secondary | ICD-10-CM | POA: Diagnosis not present

## 2020-05-13 DIAGNOSIS — M810 Age-related osteoporosis without current pathological fracture: Secondary | ICD-10-CM | POA: Diagnosis not present

## 2020-05-13 DIAGNOSIS — E039 Hypothyroidism, unspecified: Secondary | ICD-10-CM | POA: Diagnosis not present

## 2020-05-13 DIAGNOSIS — M47819 Spondylosis without myelopathy or radiculopathy, site unspecified: Secondary | ICD-10-CM | POA: Diagnosis not present

## 2020-05-13 DIAGNOSIS — J45909 Unspecified asthma, uncomplicated: Secondary | ICD-10-CM | POA: Diagnosis not present

## 2020-05-13 DIAGNOSIS — K529 Noninfective gastroenteritis and colitis, unspecified: Secondary | ICD-10-CM | POA: Diagnosis not present

## 2020-05-13 DIAGNOSIS — M75101 Unspecified rotator cuff tear or rupture of right shoulder, not specified as traumatic: Secondary | ICD-10-CM | POA: Diagnosis not present

## 2020-05-13 DIAGNOSIS — J449 Chronic obstructive pulmonary disease, unspecified: Secondary | ICD-10-CM | POA: Diagnosis not present

## 2020-05-13 DIAGNOSIS — E559 Vitamin D deficiency, unspecified: Secondary | ICD-10-CM | POA: Diagnosis not present

## 2020-05-13 DIAGNOSIS — U071 COVID-19: Secondary | ICD-10-CM | POA: Diagnosis not present

## 2020-05-13 DIAGNOSIS — I10 Essential (primary) hypertension: Secondary | ICD-10-CM | POA: Diagnosis not present

## 2020-05-13 DIAGNOSIS — K922 Gastrointestinal hemorrhage, unspecified: Secondary | ICD-10-CM | POA: Diagnosis not present

## 2020-05-13 DIAGNOSIS — M2550 Pain in unspecified joint: Secondary | ICD-10-CM | POA: Diagnosis not present

## 2020-05-13 DIAGNOSIS — M180 Bilateral primary osteoarthritis of first carpometacarpal joints: Secondary | ICD-10-CM | POA: Diagnosis not present

## 2020-05-13 DIAGNOSIS — K219 Gastro-esophageal reflux disease without esophagitis: Secondary | ICD-10-CM | POA: Diagnosis not present

## 2020-05-13 DIAGNOSIS — D509 Iron deficiency anemia, unspecified: Secondary | ICD-10-CM | POA: Diagnosis not present

## 2020-05-13 DIAGNOSIS — K51311 Ulcerative (chronic) rectosigmoiditis with rectal bleeding: Secondary | ICD-10-CM | POA: Diagnosis not present

## 2020-05-18 DIAGNOSIS — M47819 Spondylosis without myelopathy or radiculopathy, site unspecified: Secondary | ICD-10-CM | POA: Diagnosis not present

## 2020-05-18 DIAGNOSIS — M2550 Pain in unspecified joint: Secondary | ICD-10-CM | POA: Diagnosis not present

## 2020-05-18 DIAGNOSIS — K529 Noninfective gastroenteritis and colitis, unspecified: Secondary | ICD-10-CM | POA: Diagnosis not present

## 2020-05-18 DIAGNOSIS — K219 Gastro-esophageal reflux disease without esophagitis: Secondary | ICD-10-CM | POA: Diagnosis not present

## 2020-05-18 DIAGNOSIS — U071 COVID-19: Secondary | ICD-10-CM | POA: Diagnosis not present

## 2020-05-18 DIAGNOSIS — I10 Essential (primary) hypertension: Secondary | ICD-10-CM | POA: Diagnosis not present

## 2020-05-18 DIAGNOSIS — K51311 Ulcerative (chronic) rectosigmoiditis with rectal bleeding: Secondary | ICD-10-CM | POA: Diagnosis not present

## 2020-05-18 DIAGNOSIS — J45909 Unspecified asthma, uncomplicated: Secondary | ICD-10-CM | POA: Diagnosis not present

## 2020-05-18 DIAGNOSIS — E039 Hypothyroidism, unspecified: Secondary | ICD-10-CM | POA: Diagnosis not present

## 2020-05-18 DIAGNOSIS — D509 Iron deficiency anemia, unspecified: Secondary | ICD-10-CM | POA: Diagnosis not present

## 2020-05-18 DIAGNOSIS — M75101 Unspecified rotator cuff tear or rupture of right shoulder, not specified as traumatic: Secondary | ICD-10-CM | POA: Diagnosis not present

## 2020-05-18 DIAGNOSIS — E7800 Pure hypercholesterolemia, unspecified: Secondary | ICD-10-CM | POA: Diagnosis not present

## 2020-05-18 DIAGNOSIS — J449 Chronic obstructive pulmonary disease, unspecified: Secondary | ICD-10-CM | POA: Diagnosis not present

## 2020-05-18 DIAGNOSIS — K922 Gastrointestinal hemorrhage, unspecified: Secondary | ICD-10-CM | POA: Diagnosis not present

## 2020-05-18 DIAGNOSIS — M810 Age-related osteoporosis without current pathological fracture: Secondary | ICD-10-CM | POA: Diagnosis not present

## 2020-05-18 DIAGNOSIS — E559 Vitamin D deficiency, unspecified: Secondary | ICD-10-CM | POA: Diagnosis not present

## 2020-05-20 DIAGNOSIS — E559 Vitamin D deficiency, unspecified: Secondary | ICD-10-CM | POA: Diagnosis not present

## 2020-05-20 DIAGNOSIS — M75101 Unspecified rotator cuff tear or rupture of right shoulder, not specified as traumatic: Secondary | ICD-10-CM | POA: Diagnosis not present

## 2020-05-20 DIAGNOSIS — K219 Gastro-esophageal reflux disease without esophagitis: Secondary | ICD-10-CM | POA: Diagnosis not present

## 2020-05-20 DIAGNOSIS — D509 Iron deficiency anemia, unspecified: Secondary | ICD-10-CM | POA: Diagnosis not present

## 2020-05-20 DIAGNOSIS — E039 Hypothyroidism, unspecified: Secondary | ICD-10-CM | POA: Diagnosis not present

## 2020-05-20 DIAGNOSIS — U071 COVID-19: Secondary | ICD-10-CM | POA: Diagnosis not present

## 2020-05-20 DIAGNOSIS — I10 Essential (primary) hypertension: Secondary | ICD-10-CM | POA: Diagnosis not present

## 2020-05-20 DIAGNOSIS — M810 Age-related osteoporosis without current pathological fracture: Secondary | ICD-10-CM | POA: Diagnosis not present

## 2020-05-20 DIAGNOSIS — K51311 Ulcerative (chronic) rectosigmoiditis with rectal bleeding: Secondary | ICD-10-CM | POA: Diagnosis not present

## 2020-05-20 DIAGNOSIS — E7800 Pure hypercholesterolemia, unspecified: Secondary | ICD-10-CM | POA: Diagnosis not present

## 2020-05-20 DIAGNOSIS — M47819 Spondylosis without myelopathy or radiculopathy, site unspecified: Secondary | ICD-10-CM | POA: Diagnosis not present

## 2020-05-20 DIAGNOSIS — K922 Gastrointestinal hemorrhage, unspecified: Secondary | ICD-10-CM | POA: Diagnosis not present

## 2020-05-20 DIAGNOSIS — J449 Chronic obstructive pulmonary disease, unspecified: Secondary | ICD-10-CM | POA: Diagnosis not present

## 2020-05-20 DIAGNOSIS — K529 Noninfective gastroenteritis and colitis, unspecified: Secondary | ICD-10-CM | POA: Diagnosis not present

## 2020-05-20 DIAGNOSIS — J45909 Unspecified asthma, uncomplicated: Secondary | ICD-10-CM | POA: Diagnosis not present

## 2020-05-20 DIAGNOSIS — M2550 Pain in unspecified joint: Secondary | ICD-10-CM | POA: Diagnosis not present

## 2020-05-25 DIAGNOSIS — K529 Noninfective gastroenteritis and colitis, unspecified: Secondary | ICD-10-CM | POA: Diagnosis not present

## 2020-05-25 DIAGNOSIS — K219 Gastro-esophageal reflux disease without esophagitis: Secondary | ICD-10-CM | POA: Diagnosis not present

## 2020-05-25 DIAGNOSIS — J45909 Unspecified asthma, uncomplicated: Secondary | ICD-10-CM | POA: Diagnosis not present

## 2020-05-25 DIAGNOSIS — E039 Hypothyroidism, unspecified: Secondary | ICD-10-CM | POA: Diagnosis not present

## 2020-05-25 DIAGNOSIS — K922 Gastrointestinal hemorrhage, unspecified: Secondary | ICD-10-CM | POA: Diagnosis not present

## 2020-05-25 DIAGNOSIS — M2550 Pain in unspecified joint: Secondary | ICD-10-CM | POA: Diagnosis not present

## 2020-05-25 DIAGNOSIS — D509 Iron deficiency anemia, unspecified: Secondary | ICD-10-CM | POA: Diagnosis not present

## 2020-05-25 DIAGNOSIS — K51311 Ulcerative (chronic) rectosigmoiditis with rectal bleeding: Secondary | ICD-10-CM | POA: Diagnosis not present

## 2020-05-25 DIAGNOSIS — J449 Chronic obstructive pulmonary disease, unspecified: Secondary | ICD-10-CM | POA: Diagnosis not present

## 2020-05-25 DIAGNOSIS — E559 Vitamin D deficiency, unspecified: Secondary | ICD-10-CM | POA: Diagnosis not present

## 2020-05-25 DIAGNOSIS — U071 COVID-19: Secondary | ICD-10-CM | POA: Diagnosis not present

## 2020-05-25 DIAGNOSIS — M47819 Spondylosis without myelopathy or radiculopathy, site unspecified: Secondary | ICD-10-CM | POA: Diagnosis not present

## 2020-05-25 DIAGNOSIS — M75101 Unspecified rotator cuff tear or rupture of right shoulder, not specified as traumatic: Secondary | ICD-10-CM | POA: Diagnosis not present

## 2020-05-25 DIAGNOSIS — I10 Essential (primary) hypertension: Secondary | ICD-10-CM | POA: Diagnosis not present

## 2020-05-25 DIAGNOSIS — M810 Age-related osteoporosis without current pathological fracture: Secondary | ICD-10-CM | POA: Diagnosis not present

## 2020-05-25 DIAGNOSIS — E7800 Pure hypercholesterolemia, unspecified: Secondary | ICD-10-CM | POA: Diagnosis not present

## 2020-05-27 DIAGNOSIS — J45909 Unspecified asthma, uncomplicated: Secondary | ICD-10-CM | POA: Diagnosis not present

## 2020-05-27 DIAGNOSIS — K529 Noninfective gastroenteritis and colitis, unspecified: Secondary | ICD-10-CM | POA: Diagnosis not present

## 2020-05-27 DIAGNOSIS — K51311 Ulcerative (chronic) rectosigmoiditis with rectal bleeding: Secondary | ICD-10-CM | POA: Diagnosis not present

## 2020-05-27 DIAGNOSIS — E559 Vitamin D deficiency, unspecified: Secondary | ICD-10-CM | POA: Diagnosis not present

## 2020-05-27 DIAGNOSIS — I10 Essential (primary) hypertension: Secondary | ICD-10-CM | POA: Diagnosis not present

## 2020-05-27 DIAGNOSIS — E7800 Pure hypercholesterolemia, unspecified: Secondary | ICD-10-CM | POA: Diagnosis not present

## 2020-05-27 DIAGNOSIS — K922 Gastrointestinal hemorrhage, unspecified: Secondary | ICD-10-CM | POA: Diagnosis not present

## 2020-05-27 DIAGNOSIS — D509 Iron deficiency anemia, unspecified: Secondary | ICD-10-CM | POA: Diagnosis not present

## 2020-05-27 DIAGNOSIS — M2550 Pain in unspecified joint: Secondary | ICD-10-CM | POA: Diagnosis not present

## 2020-05-27 DIAGNOSIS — J449 Chronic obstructive pulmonary disease, unspecified: Secondary | ICD-10-CM | POA: Diagnosis not present

## 2020-05-27 DIAGNOSIS — K219 Gastro-esophageal reflux disease without esophagitis: Secondary | ICD-10-CM | POA: Diagnosis not present

## 2020-05-27 DIAGNOSIS — M810 Age-related osteoporosis without current pathological fracture: Secondary | ICD-10-CM | POA: Diagnosis not present

## 2020-05-27 DIAGNOSIS — E039 Hypothyroidism, unspecified: Secondary | ICD-10-CM | POA: Diagnosis not present

## 2020-05-27 DIAGNOSIS — M75101 Unspecified rotator cuff tear or rupture of right shoulder, not specified as traumatic: Secondary | ICD-10-CM | POA: Diagnosis not present

## 2020-05-27 DIAGNOSIS — U071 COVID-19: Secondary | ICD-10-CM | POA: Diagnosis not present

## 2020-05-27 DIAGNOSIS — M47819 Spondylosis without myelopathy or radiculopathy, site unspecified: Secondary | ICD-10-CM | POA: Diagnosis not present

## 2020-05-28 DIAGNOSIS — M810 Age-related osteoporosis without current pathological fracture: Secondary | ICD-10-CM | POA: Diagnosis not present

## 2020-05-28 DIAGNOSIS — K219 Gastro-esophageal reflux disease without esophagitis: Secondary | ICD-10-CM | POA: Diagnosis not present

## 2020-05-28 DIAGNOSIS — K529 Noninfective gastroenteritis and colitis, unspecified: Secondary | ICD-10-CM | POA: Diagnosis not present

## 2020-05-28 DIAGNOSIS — U071 COVID-19: Secondary | ICD-10-CM | POA: Diagnosis not present

## 2020-05-28 DIAGNOSIS — K922 Gastrointestinal hemorrhage, unspecified: Secondary | ICD-10-CM | POA: Diagnosis not present

## 2020-05-28 DIAGNOSIS — M75101 Unspecified rotator cuff tear or rupture of right shoulder, not specified as traumatic: Secondary | ICD-10-CM | POA: Diagnosis not present

## 2020-05-28 DIAGNOSIS — E039 Hypothyroidism, unspecified: Secondary | ICD-10-CM | POA: Diagnosis not present

## 2020-05-28 DIAGNOSIS — M47819 Spondylosis without myelopathy or radiculopathy, site unspecified: Secondary | ICD-10-CM | POA: Diagnosis not present

## 2020-05-28 DIAGNOSIS — D509 Iron deficiency anemia, unspecified: Secondary | ICD-10-CM | POA: Diagnosis not present

## 2020-05-28 DIAGNOSIS — E559 Vitamin D deficiency, unspecified: Secondary | ICD-10-CM | POA: Diagnosis not present

## 2020-05-28 DIAGNOSIS — M2550 Pain in unspecified joint: Secondary | ICD-10-CM | POA: Diagnosis not present

## 2020-05-28 DIAGNOSIS — J449 Chronic obstructive pulmonary disease, unspecified: Secondary | ICD-10-CM | POA: Diagnosis not present

## 2020-05-28 DIAGNOSIS — I10 Essential (primary) hypertension: Secondary | ICD-10-CM | POA: Diagnosis not present

## 2020-05-28 DIAGNOSIS — E7800 Pure hypercholesterolemia, unspecified: Secondary | ICD-10-CM | POA: Diagnosis not present

## 2020-05-28 DIAGNOSIS — J45909 Unspecified asthma, uncomplicated: Secondary | ICD-10-CM | POA: Diagnosis not present

## 2020-05-28 DIAGNOSIS — K51311 Ulcerative (chronic) rectosigmoiditis with rectal bleeding: Secondary | ICD-10-CM | POA: Diagnosis not present

## 2020-06-06 DIAGNOSIS — M2550 Pain in unspecified joint: Secondary | ICD-10-CM | POA: Diagnosis not present

## 2020-06-06 DIAGNOSIS — K219 Gastro-esophageal reflux disease without esophagitis: Secondary | ICD-10-CM | POA: Diagnosis not present

## 2020-06-06 DIAGNOSIS — M75101 Unspecified rotator cuff tear or rupture of right shoulder, not specified as traumatic: Secondary | ICD-10-CM | POA: Diagnosis not present

## 2020-06-06 DIAGNOSIS — K529 Noninfective gastroenteritis and colitis, unspecified: Secondary | ICD-10-CM | POA: Diagnosis not present

## 2020-06-06 DIAGNOSIS — E039 Hypothyroidism, unspecified: Secondary | ICD-10-CM | POA: Diagnosis not present

## 2020-06-06 DIAGNOSIS — I10 Essential (primary) hypertension: Secondary | ICD-10-CM | POA: Diagnosis not present

## 2020-06-06 DIAGNOSIS — D509 Iron deficiency anemia, unspecified: Secondary | ICD-10-CM | POA: Diagnosis not present

## 2020-06-06 DIAGNOSIS — J449 Chronic obstructive pulmonary disease, unspecified: Secondary | ICD-10-CM | POA: Diagnosis not present

## 2020-06-06 DIAGNOSIS — K51311 Ulcerative (chronic) rectosigmoiditis with rectal bleeding: Secondary | ICD-10-CM | POA: Diagnosis not present

## 2020-06-06 DIAGNOSIS — K922 Gastrointestinal hemorrhage, unspecified: Secondary | ICD-10-CM | POA: Diagnosis not present

## 2020-06-06 DIAGNOSIS — M810 Age-related osteoporosis without current pathological fracture: Secondary | ICD-10-CM | POA: Diagnosis not present

## 2020-06-06 DIAGNOSIS — M47819 Spondylosis without myelopathy or radiculopathy, site unspecified: Secondary | ICD-10-CM | POA: Diagnosis not present

## 2020-06-06 DIAGNOSIS — U071 COVID-19: Secondary | ICD-10-CM | POA: Diagnosis not present

## 2020-06-06 DIAGNOSIS — J45909 Unspecified asthma, uncomplicated: Secondary | ICD-10-CM | POA: Diagnosis not present

## 2020-06-06 DIAGNOSIS — E7800 Pure hypercholesterolemia, unspecified: Secondary | ICD-10-CM | POA: Diagnosis not present

## 2020-06-06 DIAGNOSIS — E559 Vitamin D deficiency, unspecified: Secondary | ICD-10-CM | POA: Diagnosis not present

## 2020-06-08 ENCOUNTER — Other Ambulatory Visit: Payer: Self-pay | Admitting: Family Medicine

## 2020-06-08 NOTE — Telephone Encounter (Signed)
Pharmacy comment: Script Clarification:PT SAYS SHE WAS VERBALLY TOLD TO START TAKING 75MCG DAILY RATHER THAN 2X/WK. WE NEED A NEW SCRIPT FOR 75MCG DAILY SENT INTO PHARMACY.  Please advise.

## 2020-06-10 DIAGNOSIS — J449 Chronic obstructive pulmonary disease, unspecified: Secondary | ICD-10-CM | POA: Diagnosis not present

## 2020-06-10 DIAGNOSIS — K219 Gastro-esophageal reflux disease without esophagitis: Secondary | ICD-10-CM | POA: Diagnosis not present

## 2020-06-10 DIAGNOSIS — M2550 Pain in unspecified joint: Secondary | ICD-10-CM | POA: Diagnosis not present

## 2020-06-10 DIAGNOSIS — M47819 Spondylosis without myelopathy or radiculopathy, site unspecified: Secondary | ICD-10-CM | POA: Diagnosis not present

## 2020-06-10 DIAGNOSIS — E559 Vitamin D deficiency, unspecified: Secondary | ICD-10-CM | POA: Diagnosis not present

## 2020-06-10 DIAGNOSIS — E7800 Pure hypercholesterolemia, unspecified: Secondary | ICD-10-CM | POA: Diagnosis not present

## 2020-06-10 DIAGNOSIS — K922 Gastrointestinal hemorrhage, unspecified: Secondary | ICD-10-CM | POA: Diagnosis not present

## 2020-06-10 DIAGNOSIS — M810 Age-related osteoporosis without current pathological fracture: Secondary | ICD-10-CM | POA: Diagnosis not present

## 2020-06-10 DIAGNOSIS — E039 Hypothyroidism, unspecified: Secondary | ICD-10-CM | POA: Diagnosis not present

## 2020-06-10 DIAGNOSIS — J45909 Unspecified asthma, uncomplicated: Secondary | ICD-10-CM | POA: Diagnosis not present

## 2020-06-10 DIAGNOSIS — I10 Essential (primary) hypertension: Secondary | ICD-10-CM | POA: Diagnosis not present

## 2020-06-10 DIAGNOSIS — K51311 Ulcerative (chronic) rectosigmoiditis with rectal bleeding: Secondary | ICD-10-CM | POA: Diagnosis not present

## 2020-06-10 DIAGNOSIS — D509 Iron deficiency anemia, unspecified: Secondary | ICD-10-CM | POA: Diagnosis not present

## 2020-06-10 DIAGNOSIS — U071 COVID-19: Secondary | ICD-10-CM | POA: Diagnosis not present

## 2020-06-10 DIAGNOSIS — M75101 Unspecified rotator cuff tear or rupture of right shoulder, not specified as traumatic: Secondary | ICD-10-CM | POA: Diagnosis not present

## 2020-06-10 DIAGNOSIS — K529 Noninfective gastroenteritis and colitis, unspecified: Secondary | ICD-10-CM | POA: Diagnosis not present

## 2020-06-11 ENCOUNTER — Other Ambulatory Visit: Payer: Self-pay | Admitting: Family Medicine

## 2020-06-17 DIAGNOSIS — K51311 Ulcerative (chronic) rectosigmoiditis with rectal bleeding: Secondary | ICD-10-CM | POA: Diagnosis not present

## 2020-06-17 DIAGNOSIS — J45909 Unspecified asthma, uncomplicated: Secondary | ICD-10-CM | POA: Diagnosis not present

## 2020-06-17 DIAGNOSIS — E039 Hypothyroidism, unspecified: Secondary | ICD-10-CM | POA: Diagnosis not present

## 2020-06-17 DIAGNOSIS — K529 Noninfective gastroenteritis and colitis, unspecified: Secondary | ICD-10-CM | POA: Diagnosis not present

## 2020-06-17 DIAGNOSIS — K219 Gastro-esophageal reflux disease without esophagitis: Secondary | ICD-10-CM | POA: Diagnosis not present

## 2020-06-17 DIAGNOSIS — M47819 Spondylosis without myelopathy or radiculopathy, site unspecified: Secondary | ICD-10-CM | POA: Diagnosis not present

## 2020-06-17 DIAGNOSIS — D509 Iron deficiency anemia, unspecified: Secondary | ICD-10-CM | POA: Diagnosis not present

## 2020-06-17 DIAGNOSIS — J449 Chronic obstructive pulmonary disease, unspecified: Secondary | ICD-10-CM | POA: Diagnosis not present

## 2020-06-17 DIAGNOSIS — M75101 Unspecified rotator cuff tear or rupture of right shoulder, not specified as traumatic: Secondary | ICD-10-CM | POA: Diagnosis not present

## 2020-06-17 DIAGNOSIS — E7800 Pure hypercholesterolemia, unspecified: Secondary | ICD-10-CM | POA: Diagnosis not present

## 2020-06-17 DIAGNOSIS — M2550 Pain in unspecified joint: Secondary | ICD-10-CM | POA: Diagnosis not present

## 2020-06-17 DIAGNOSIS — M810 Age-related osteoporosis without current pathological fracture: Secondary | ICD-10-CM | POA: Diagnosis not present

## 2020-06-17 DIAGNOSIS — E559 Vitamin D deficiency, unspecified: Secondary | ICD-10-CM | POA: Diagnosis not present

## 2020-06-17 DIAGNOSIS — I10 Essential (primary) hypertension: Secondary | ICD-10-CM | POA: Diagnosis not present

## 2020-06-17 DIAGNOSIS — U071 COVID-19: Secondary | ICD-10-CM | POA: Diagnosis not present

## 2020-06-17 DIAGNOSIS — K922 Gastrointestinal hemorrhage, unspecified: Secondary | ICD-10-CM | POA: Diagnosis not present

## 2020-06-29 ENCOUNTER — Emergency Department (HOSPITAL_BASED_OUTPATIENT_CLINIC_OR_DEPARTMENT_OTHER)
Admission: EM | Admit: 2020-06-29 | Discharge: 2020-06-29 | Disposition: A | Discharge status: Home / Self Care | Payer: Medicare Other | Attending: Emergency Medicine | Admitting: Emergency Medicine

## 2020-06-29 ENCOUNTER — Encounter (HOSPITAL_BASED_OUTPATIENT_CLINIC_OR_DEPARTMENT_OTHER): Payer: Self-pay | Admitting: Obstetrics and Gynecology

## 2020-06-29 ENCOUNTER — Other Ambulatory Visit: Payer: Self-pay

## 2020-06-29 ENCOUNTER — Telehealth: Payer: Self-pay | Admitting: Family Medicine

## 2020-06-29 DIAGNOSIS — J449 Chronic obstructive pulmonary disease, unspecified: Secondary | ICD-10-CM | POA: Diagnosis not present

## 2020-06-29 DIAGNOSIS — I10 Essential (primary) hypertension: Secondary | ICD-10-CM | POA: Insufficient documentation

## 2020-06-29 DIAGNOSIS — Z79899 Other long term (current) drug therapy: Secondary | ICD-10-CM | POA: Insufficient documentation

## 2020-06-29 DIAGNOSIS — N390 Urinary tract infection, site not specified: Secondary | ICD-10-CM

## 2020-06-29 DIAGNOSIS — R1030 Lower abdominal pain, unspecified: Secondary | ICD-10-CM | POA: Diagnosis present

## 2020-06-29 DIAGNOSIS — E039 Hypothyroidism, unspecified: Secondary | ICD-10-CM | POA: Diagnosis not present

## 2020-06-29 DIAGNOSIS — Z7951 Long term (current) use of inhaled steroids: Secondary | ICD-10-CM | POA: Diagnosis not present

## 2020-06-29 DIAGNOSIS — Z8616 Personal history of COVID-19: Secondary | ICD-10-CM | POA: Insufficient documentation

## 2020-06-29 DIAGNOSIS — J45909 Unspecified asthma, uncomplicated: Secondary | ICD-10-CM | POA: Diagnosis not present

## 2020-06-29 LAB — URINALYSIS, ROUTINE W REFLEX MICROSCOPIC
Bilirubin Urine: NEGATIVE
Glucose, UA: NEGATIVE mg/dL
Ketones, ur: NEGATIVE mg/dL
Nitrite: NEGATIVE
Protein, ur: 30 mg/dL — AB
Specific Gravity, Urine: 1.014 (ref 1.005–1.030)
WBC, UA: >50 WBC/hpf — ABNORMAL HIGH (ref 0–5)
pH: 6.5 (ref 5.0–8.0)

## 2020-06-29 MED ORDER — SULFAMETHOXAZOLE-TRIMETHOPRIM 800-160 MG PO TABS: 1.0000 | ORAL_TABLET | Freq: Two times a day (BID) | ORAL | 0 refills | Status: DC

## 2020-06-29 MED ORDER — CEPHALEXIN 500 MG PO CAPS: 500.0000 mg | ORAL_CAPSULE | Freq: Two times a day (BID) | ORAL | 0 refills | Status: DC

## 2020-06-29 NOTE — Discharge Instructions (Signed)
If you develop worsening, continued, or recurrent abdominal pain, uncontrolled vomiting, fever, chest or back pain, or any other new/concerning symptoms then return to the ER for evaluation.  

## 2020-06-29 NOTE — ED Provider Notes (Signed)
[facility] EMERGENCY DEPT Provider Note   CSN: [id number] Arrival date & time: 06/29/20  1544     History Chief Complaint  Patient presents with  . Recurrent UTI    [name] is a 86 y.o. female.  HPI 86 year old female presents with lower abdominal pain and dysuria.  Started last night/this morning.  She has chronic abdominal pain but this is a little worse.  Rates it as about a 4 out of 10.  Just across her diffuse lower abdomen.  Has also been having dysuria.  She is drinking more water and also urinating more, not sure if is just from drinking more water.  However it burns to urinate.  Has had a couple UTIs before.  No significant back pain or fevers or vomiting.  Past Medical History:  Diagnosis Date  . ABDOMINAL PAIN, CHRONIC 05/28/2008  . ALLERGIC RHINITIS 09/21/2006  . ANEMIA-NOS 09/21/2006  . ASTHMA 09/21/2006  . ASYMPTOMATIC POSTMENOPAUSAL STATUS 05/28/2008  . Cataract   . CHEST PAIN, ATYPICAL 05/28/2008  . COPD (chronic obstructive pulmonary disease) ([place])   . COVID-19 virus infection 02/2020  . DDD (degenerative disc disease)   . DIVERTICULITIS, HX OF 09/21/2006  . GERD 09/21/2006  . HYPERCHOLESTEROLEMIA 06/05/2009  . HYPERTENSION 09/21/2006  . HYPOTHYROIDISM 09/21/2006  . Peptic ulcer disease   . Purpura ([place])   . RENAL INSUFFICIENCY 09/21/2006  . Shingles   . Small bowel obstruction ([place])   . Somatization disorder 04/25/2007  . UNSPECIFIED URINARY CALCULUS 05/28/2008  . UTI (urinary tract infection) 07/2019    Patient Active Problem List   Diagnosis Date Noted  . GIB (gastrointestinal bleeding) 03/11/2020  . COVID-19 virus infection 02/2020  . Chronic ulcerative rectosigmoiditis with rectal bleeding ([place])   . Rectal bleeding   . Abnormal CT scan, gastrointestinal tract   . Lower GI bleed 11/16/2019  . LLQ abdominal pain 10/31/2019  . Diverticulitis 10/31/2019  . UTI (urinary tract infection) 08/06/2019  . COPD (chronic obstructive pulmonary  disease) ([place])   . Colitis   . Vitamin D deficiency 05/01/2019  . Arthralgia 08/21/2017  . Osteoporosis 11/21/2016  . Postmenopausal status 09/06/2016  . Other nonspecific abnormal finding of lung field 08/14/2015  . Hyponatremia 07/21/2014  . Menopausal state 07/18/2014  . Hematest positive stools 08/05/2013  . Anemia, blood loss 08/05/2013  . Cerumen impaction 06/03/2013  . History of adrenal disorder 10/15/2012  . Rash and nonspecific skin eruption 10/15/2012  . Iron deficiency anemia 06/20/2012  . Psychogenic polydipsia 06/20/2012  . Nonspecific abnormal electrocardiogram (ECG) (EKG) 06/20/2012  . Polyuria 05/02/2012  . Pelvic pain in female 08/16/2011  . Leg ulcer ([place]) 06/16/2010  . Routine general medical examination at a health care facility 06/11/2010  . Encounter for long-term (current) use of other medications 06/11/2010  . HYPERCHOLESTEROLEMIA 06/05/2009  . UNSPECIFIED URINARY CALCULUS 05/28/2008  . CHEST PAIN, ATYPICAL 05/28/2008  . ABDOMINAL PAIN, CHRONIC 05/28/2008  . ASYMPTOMATIC POSTMENOPAUSAL STATUS 05/28/2008  . SOMATIZATION DISORDER 04/25/2007  . Hypothyroidism 09/21/2006  . Essential hypertension 09/21/2006  . ALLERGIC RHINITIS 09/21/2006  . ASTHMA 09/21/2006  . GERD 09/21/2006  . Disorder resulting from impaired renal function 09/21/2006  . DIVERTICULITIS, HX OF 09/21/2006    Past Surgical History:  Procedure Laterality Date  . ABDOMINAL HYSTERECTOMY  1981  . acute Nephritis  1958  . APPENDECTOMY  1959  . BIOPSY  11/18/2019   Procedure: BIOPSY;  Surgeon: Ladene Artist, MD;  Location: [facility];  Service: Endoscopy;;  .  CESAREAN SECTION    . CHOLECYSTECTOMY  1983  . COLON SURGERY    . DENTAL SURGERY  1970  . FLEXIBLE SIGMOIDOSCOPY N/A 11/18/2019   Procedure: FLEXIBLE SIGMOIDOSCOPY;  Surgeon: Ladene Artist, MD;  Location: [facility];  Service: Endoscopy;  Laterality: N/A;  . NASAL SINUS SURGERY  1967  . Pulmonary thrombosis    .  TONSILLECTOMY    . TUBAL LIGATION       OB History    Gravida  3   Para      Term      Preterm      AB      Living  2     SAB      IAB      Ectopic      Multiple      Live Births              Family History  Problem Relation Age of Onset  . Polycystic kidney disease Mother   . Pancreatic cancer Mother   . Other Father        Schamberg disease  . Bladder Cancer Father   . Hypertension Father   . Marfan syndrome Son   . Hemochromatosis Son   . Cirrhosis Son   . Allergic rhinitis Sister   . Other Brother        bone issue as child; multiple fractures but seemed to age out of this  . Hemochromatosis Cousin   . Arthritis Sister   . Colon cancer Neg Hx   . Esophageal cancer Neg Hx   . Stomach cancer Neg Hx     Social History   Tobacco Use  . Smoking status: Never Smoker  . Smokeless tobacco: Never Used  Vaping Use  . Vaping Use: Never used  Substance Use Topics  . Alcohol use: No  . Drug use: No    Home Medications Prior to Admission medications   Medication Sig Start Date End Date Taking? Authorizing Provider  sulfamethoxazole-trimethoprim (BACTRIM DS) 800-160 MG tablet Take 1 tablet by mouth 2 (two) times daily for 3 days. 06/29/20 07/02/20 Yes Sherwood Gambler, MD  acetaminophen (TYLENOL) 325 MG tablet Take 650 mg by mouth every 4 (four) hours as needed for mild pain or headache.     [provider]  Cholecalciferol (VITAMIN D3 PO) Take 1 tablet by mouth 2 (two) times a week. Saturday and Sunday    [provider]  Ferrous Sulfate (IRON) 325 (65 FE) MG TABS Take 1 tablet by mouth daily.     [provider]  fluticasone (FLOVENT HFA) 110 MCG/ACT inhaler TAKE 1 PUFF BY MOUTH TWICE A DAY 06/11/20   Koberlein, Steele Berg, MD  Ipratropium-Albuterol (COMBIVENT) 20-100 MCG/ACT AERS respimat Inhale 1 puff into the lungs every 6 (six) hours as needed for wheezing. 03/20/20   Pokhrel, Corrie Mckusick, MD  levothyroxine (SYNTHROID) 75 MCG tablet  Take 1 tablet (75 mcg total) by mouth daily before breakfast. 06/08/20   Koberlein, Junell C, MD  mesalamine (LIALDA) 1.2 g EC tablet TAKE 2 TABLETS BY MOUTH DAILY WITH BREAKFAST. Patient taking differently: Take 2.4 g by mouth daily with breakfast. 02/27/20   Willia Craze, NP  Spacer/Aero-Holding Josiah Lobo (E-Z SPACER) inhaler Use as instructed 11/08/17   Caren Macadam, MD    Allergies    B[place] nut (berthollefia excelsa) skin test, Fluconazole, Metronidazole, Pepcid [famotidine], Shellfish allergy, Macrobid [nitrofurantoin], and Pantoprazole  Review of Systems   Review of Systems  Constitutional:  Negative for fever.  Gastrointestinal: Positive for abdominal pain. Negative for vomiting.  Genitourinary: Positive for dysuria.  Musculoskeletal: Negative for back pain.  All other systems reviewed and are negative.   Physical Exam Updated Vital Signs BP 131/73 (BP Location: Right Arm)   Pulse 79   Temp 98.6 F (37 C) (Oral)   Resp 15   SpO2 100%   Physical Exam Vitals and nursing note reviewed.  Constitutional:      General: She is not in acute distress.    Appearance: She is well-developed. She is not ill-appearing or diaphoretic.  HENT:     Head: Normocephalic and atraumatic.     Right Ear: External ear normal.     Left Ear: External ear normal.     Nose: Nose normal.  Eyes:     General:        Right eye: No discharge.        Left eye: No discharge.  Cardiovascular:     Rate and Rhythm: Normal rate and regular rhythm.     Heart sounds: Normal heart sounds.  Pulmonary:     Effort: Pulmonary effort is normal.     Breath sounds: Normal breath sounds.  Abdominal:     Palpations: Abdomen is soft.     Tenderness: There is abdominal tenderness (mild, lower, hard to localize).  Skin:    General: Skin is warm and dry.  Neurological:     Mental Status: She is alert.  Psychiatric:        Mood and Affect: Mood is not anxious.     ED Results / Procedures / Treatments    Labs (all labs ordered are listed, but only abnormal results are displayed) Labs Reviewed  URINALYSIS, ROUTINE W REFLEX MICROSCOPIC - Abnormal; Notable for the following components:      Result Value   APPearance CLOUDY (*)    Hgb urine dipstick MODERATE (*)    Protein, ur 30 (*)    Leukocytes,Ua LARGE (*)    WBC, UA >50 (*)    Bacteria, UA MANY (*)    Non Squamous Epithelial 0-5 (*)    All other components within normal limits  URINE CULTURE    EKG None  Radiology No results found.  Procedures Procedures   Medications Ordered in ED Medications - No data to display  ED Course  I have reviewed the triage vital signs and the nursing notes.  Pertinent labs & imaging results that were available during my care of the patient were reviewed by me and considered in my medical decision making (see chart for details).    MDM Rules/Calculators/A&P                          Presentation is consistent with a urinary tract infection.  Discussed with patient and family about deferring CT or labs given the acute lower abdominal discomfort in association with dysuria that does seem consistent with a UTI.  They are okay with this.  Previous 2 cultures that actually grew bacteria were both resistant to cefazolin so I think cephalosporins would be not a good idea.  She is "allergic" to [place].  Thus we will try Bactrim as this should not interfere with her meds.  We will have her follow-up with PCP.  We discussed that if she does not improve or worsen she is to return to the ER for evaluation. Final Clinical Impression(s) / ED Diagnoses Final diagnoses:  Acute urinary  tract infection    Rx / DC Orders ED Discharge Orders         Ordered    cephALEXin (KEFLEX) 500 MG capsule  2 times daily,   Status:  Discontinued        06/29/20 1731    sulfamethoxazole-trimethoprim (BACTRIM DS) 800-160 MG tablet  2 times daily        06/29/20 1735           Sherwood Gambler, MD 06/29/20  1736

## 2020-06-29 NOTE — ED Triage Notes (Signed)
Patient reports to the ER for UTI. Patient endorses pain with urination, dark urine,m and pelvic pain.

## 2020-06-29 NOTE — Telephone Encounter (Signed)
Spoke with the patient and informed her a visit is needed for evaluation prior to medication being called in.  Offered to schedule an appt for tomorrow with another provider as there are no openings for today at this time.  Patient stated she does not feel she can wait until tomorrow,  I recommended she go to an urgent care for evaluation if she does not feel she can wait until tomorrow and she agreed.

## 2020-06-29 NOTE — ED Notes (Signed)
Pt via pov from home with lower abdominal pain; states she has some back pain as well. Pt has hx of UTI and had kidney failure at age 86. Pt denies n/v/d, flank pain. Pt alert & oriented, nad noted.

## 2020-06-29 NOTE — Telephone Encounter (Signed)
Patient is calling and stated that she has been experiencing strong foul smelling urine and pain in the pelvic area. Pt wanted to see of provider could call her something in into the pharmacy. Pt declined appointment, please advise. CB is [phone]  [id number] [address] [place] [id number] Phone:  [phone]:  [phone]

## 2020-07-03 LAB — URINE CULTURE: Culture: >=100000 — AB

## 2020-07-04 ENCOUNTER — Telehealth: Payer: Self-pay | Admitting: Emergency Medicine

## 2020-07-04 NOTE — Telephone Encounter (Signed)
Post ED Visit - Positive Culture Follow-up  Culture report reviewed by antimicrobial stewardship pharmacist: [place] Team []  Elenor Quinones, Pharm.D. []  Heide Guile, Pharm.D., BCPS AQ-ID []  Parks Neptune, Pharm.D., BCPS []  Alycia Rossetti, Pharm.D., BCPS []  [place], Pharm.D., BCPS, AAHIVP []  Legrand Como, Pharm.D., BCPS, AAHIVP []  Salome Arnt, PharmD, BCPS []  Johnnette Gourd, PharmD, BCPS []  Hughes Better, PharmD, BCPS [x]  Lorelei Pont, PharmD []  Laqueta Linden, PharmD, BCPS []  Albertina Parr, PharmD  [place] Team []  Leodis Sias, PharmD []  Lindell Spar, PharmD []  Royetta Asal, PharmD []  Graylin Shiver, Rph []  Rema Fendt) Glennon Mac, PharmD []  Arlyn Dunning, PharmD []  Netta Cedars, PharmD []  Dia Sitter, PharmD []  Leone Haven, PharmD []  Gretta Arab, PharmD []  Theodis Shove, PharmD []  Peggyann Juba, PharmD []  Reuel Boom, PharmD   Positive urine culture Treated with Sulfamethoxazole-Trimethroprim, organism sensitive to the same and no further patient follow-up is required at this time.  Sandi Raveling Calen Geister 07/04/2020, 2:15 PM

## 2020-07-14 ENCOUNTER — Other Ambulatory Visit: Payer: Self-pay | Admitting: Gastroenterology

## 2020-07-15 ENCOUNTER — Telehealth: Payer: Self-pay | Admitting: Family Medicine

## 2020-07-15 MED ORDER — FLOVENT HFA 110 MCG/ACT IN AERO: INHALATION_SPRAY | RESPIRATORY_TRACT | 1 refills | Status: DC

## 2020-07-15 NOTE — Telephone Encounter (Signed)
Patient is calling and requesting a refill for fluticasone (FLOVENT HFA) 110 MCG/ACT inhaler [facility] sent [facility]    [id number] [place] [address] [address] [place] [address] Phone:  [phone]:  [phone] is [phone]

## 2020-07-15 NOTE — Telephone Encounter (Signed)
Rx done. 

## 2020-08-13 ENCOUNTER — Other Ambulatory Visit: Payer: Self-pay

## 2020-08-14 ENCOUNTER — Encounter: Payer: Self-pay | Admitting: Family Medicine

## 2020-08-14 ENCOUNTER — Ambulatory Visit (INDEPENDENT_AMBULATORY_CARE_PROVIDER_SITE_OTHER): Payer: Medicare Other | Admitting: Family Medicine

## 2020-08-14 VITALS — BP 114/70 | HR 94 | Temp 98.2°F | Ht 68.0 in | Wt 144.0 lb

## 2020-08-14 DIAGNOSIS — E538 Deficiency of other specified B group vitamins: Secondary | ICD-10-CM

## 2020-08-14 DIAGNOSIS — Z8616 Personal history of COVID-19: Secondary | ICD-10-CM

## 2020-08-14 DIAGNOSIS — E871 Hypo-osmolality and hyponatremia: Secondary | ICD-10-CM | POA: Diagnosis not present

## 2020-08-14 DIAGNOSIS — D649 Anemia, unspecified: Secondary | ICD-10-CM

## 2020-08-14 DIAGNOSIS — E039 Hypothyroidism, unspecified: Secondary | ICD-10-CM

## 2020-08-14 DIAGNOSIS — I10 Essential (primary) hypertension: Secondary | ICD-10-CM

## 2020-08-14 DIAGNOSIS — E559 Vitamin D deficiency, unspecified: Secondary | ICD-10-CM

## 2020-08-14 DIAGNOSIS — N390 Urinary tract infection, site not specified: Secondary | ICD-10-CM | POA: Diagnosis not present

## 2020-08-14 MED ORDER — FLUTICASONE PROPIONATE HFA 110 MCG/ACT IN AERO: INHALATION_SPRAY | RESPIRATORY_TRACT | 11 refills | Status: DC

## 2020-08-14 NOTE — Progress Notes (Signed)
[name] DOB: [birth year] Encounter date: 08/14/2020  This is a 86 y.o. female who presents with Chief Complaint  Patient presents with   Pain    Patient states body aches all over and changes daily     History of present illness: Last visit with me was 04/17/2016 when was virtual.  She has had 2 emergency room visits in the last 4 months for urinary tract infections.  She was having significant issues with weakness when we last talked and was supposed to be starting home physical therapy.  Last UTI was 5/23 she was treated with Bactrim due to history of resistance to cefazolin. She feels like these were related to others caring for her and not keeping her as clean as she keeps herself. No burning with urination, but skin surrounding does burn a little with urination.   "I've been through [facility]" - 15 different nurses, treatment, rehab. Everything has gone in the ditch since having COVID.   *changed flovent to 1 puff BID and she doesn't feel like this is holding her - forms thick mucous in bottom of throat and coughs, chokes.   *wondering about getting vaccine since it has been about 6 months since having COVID.   When she called ems as directed due to [place]; ems tried to talk her out of going to hospital.   Has had more bleeding under skin, feet and legs burn and hurt. Tries to keep going after it and just keeps moving. Some days better than others. On good days she tends to overdo things. Thinks if she could pace self she would feel better.   She is worried about getting vaccine because she always is "put down" when she gets vaccine and in bed for 5 days.   Taking mesalamine daily which is keeping her regular with bowels. Hasn't noted more intestinal bleeding. She has had bleeding intermittently before, but did not always note this with prior stools.   Severely anemic when in hospital - taking iron supplement and occasionally taking some vitamin C.   On gastroparesis diet. Baby food  meet only; can't otherwise digest meat. Pureed foods for last 6 years. Nothing with acid.   No one out to house now to help her. Most days does exercises on own, but not on days when she is doing a lot of house work. Showers, cleans bathroom daily. All laundry, all cleaning of house. Son helps with heavy items.   Allergies  Allergen Reactions   B[place]ivia Nut Daisey Must) Skin Test Anaphylaxis   Fluconazole Shortness Of Breath   Metronidazole Shortness Of Breath and Nausea And Vomiting   Pepcid [Famotidine] Shortness Of Breath and Other (See Comments)    Dizziness   Shellfish Allergy Anaphylaxis   Macrobid [Nitrofurantoin]     REACTION: Syncope   Pantoprazole Other (See Comments)    CHEST PAIN   Current Meds  Medication Sig   acetaminophen (TYLENOL) 325 MG tablet Take 650 mg by mouth every 4 (four) hours as needed for mild pain or headache.    Cholecalciferol (VITAMIN D3 PO) Take 1 tablet by mouth 2 (two) times a week. Saturday and Sunday   Ferrous Sulfate (IRON) 325 (65 FE) MG TABS Take 1 tablet by mouth daily.    fluticasone (FLOVENT HFA) 110 MCG/ACT inhaler TAKE 1 PUFF BY MOUTH TWICE A DAY   levothyroxine (SYNTHROID) 75 MCG tablet Take 1 tablet (75 mcg total) by mouth daily before breakfast.   mesalamine (LIALDA) 1.2 g EC tablet  TAKE 2 TABLETS BY MOUTH DAILY WITH BREAKFAST. (Patient taking differently: Take 2.4 g by mouth daily with breakfast.)    Review of Systems  Constitutional:  Positive for fatigue. Negative for activity change, appetite change, chills, fever and unexpected weight change.  HENT:  Negative for congestion, ear pain, hearing loss, sinus pressure, sinus pain, sore throat and trouble swallowing.   Eyes:  Negative for pain and visual disturbance.  Respiratory:  Negative for cough, chest tightness, shortness of breath and wheezing.   Cardiovascular:  Negative for chest pain, palpitations and leg swelling.  Gastrointestinal:  Positive for abdominal pain  (chronic, intermittent). Negative for blood in stool, constipation, diarrhea, nausea and vomiting.  Genitourinary:  Negative for difficulty urinating and menstrual problem.  Musculoskeletal:  Negative for arthralgias and back pain.  Skin:  Positive for rash (still gets her easy bruising, followed by brown spots. skin feels generally tender to touch).  Neurological:  Negative for dizziness, weakness, numbness and headaches.  Hematological:  Negative for adenopathy. Does not bruise/bleed easily.  Psychiatric/Behavioral:  Negative for sleep disturbance and suicidal ideas. The patient is not nervous/anxious.    Objective:  BP 114/70 (BP Location: Left Arm, Patient Position: Sitting, Cuff Size: Normal)   Pulse 94   Temp 98.2 F (36.8 C) (Oral)   Ht 5' 8"  (1.727 m)   Wt 144 lb (65.3 kg)   SpO2 98%   BMI 21.90 kg/m   Weight: 144 lb (65.3 kg)   BP Readings from Last 3 Encounters:  08/14/20 114/70  06/29/20 131/73  04/28/20 (!) 110/53   Wt Readings from Last 3 Encounters:  08/14/20 144 lb (65.3 kg)  04/28/20 139 lb (63 kg)  03/11/20 140 lb (63.5 kg)    Physical Exam Constitutional:      General: She is not in acute distress.    Appearance: She is well-developed.  Cardiovascular:     Rate and Rhythm: Normal rate and regular rhythm.     Heart sounds: Normal heart sounds. No murmur heard.   No friction rub.  Pulmonary:     Effort: Pulmonary effort is normal. No respiratory distress.     Breath sounds: Normal breath sounds. No wheezing or rales.  Abdominal:     General: Abdomen is flat.     Tenderness: There is no abdominal tenderness. There is no guarding.  Musculoskeletal:     Right lower leg: No edema.     Left lower leg: No edema.  Skin:    Coloration: Skin is pale.  Neurological:     Mental Status: She is alert and oriented to person, place, and time.  Psychiatric:        Behavior: Behavior normal.    Assessment/Plan  1. Essential hypertension Well-controlled.  She  is not on any medications for blood pressure. - Comprehensive metabolic panel; Future - CBC with Differential/Platelet; Future - Comprehensive metabolic panel - CBC with Differential/Platelet  2. Hypothyroidism, unspecified type Continue with Synthroid.  Recheck levels today.  Has been stable on 35 mcg of Synthroid. - TSH; Future - TSH  3. Recurrent UTI Has had 2 recent UTIs.  We will recheck urine as she does complain of intermittent frequency. - Urine Culture; Future - Urine Culture  4. Anemia, unspecified type GI loss, evaluated previously at the hospital.  No longer having any blood in stools. - Iron, TIBC and Ferritin Panel; Future - Iron, TIBC and Ferritin Panel  5. Hyponatremia Recheck blood work.  6. Hypomagnesemia Recheck blood work. - Magnesium,  RBC; Future - Phosphorus; Future - Magnesium, RBC - Phosphorus  7. Vitamin D deficiency - VITAMIN D 25 Hydroxy (Vit-D Deficiency, Fractures); Future - VITAMIN D 25 Hydroxy (Vit-D Deficiency, Fractures)  8. Vitamin B12 deficiency - Vitamin B12; Future - Vitamin B12  9. History of COVID-19 She is extremely anxious about getting COVID vaccination because she gets so extremely ill after getting vaccines.  However, she is also worried about getting COVID.  Although not completely predictive of immune status, we discussed adding on COVID antibody today for informative purposes.  Certainly if antibody does not appear present, it would be more urgent for her to get vaccinated. - SARS CoV2 Serology(COVID19) AB(IgG,IgM),Immunoassay; Future - SARS CoV2 Serology(COVID19) AB(IgG,IgM),Immunoassay    Return in about 3 months (around 11/14/2020) for Chronic condition visit.  42 minutes spent in chart review, time with patient, physical exam, discussion of current evaluation, chart time.   Micheline Rough, MD

## 2020-08-17 ENCOUNTER — Other Ambulatory Visit: Payer: Self-pay | Admitting: Family Medicine

## 2020-08-17 ENCOUNTER — Telehealth: Payer: Self-pay | Admitting: Family Medicine

## 2020-08-17 LAB — COMPREHENSIVE METABOLIC PANEL
AG Ratio: 1.4 (calc) (ref 1.0–2.5)
ALT: 8 U/L (ref 6–29)
AST: 17 U/L (ref 10–35)
Albumin: 4.1 g/dL (ref 3.6–5.1)
Alkaline phosphatase (APISO): 54 U/L (ref 37–153)
BUN: 16 mg/dL (ref 7–25)
CO2: 25 mmol/L (ref 20–32)
Calcium: 10.4 mg/dL (ref 8.6–10.4)
Chloride: 101 mmol/L (ref 98–110)
Creat: 0.64 mg/dL (ref 0.60–0.88)
Globulin: 2.9 g/dL (calc) (ref 1.9–3.7)
Glucose, Bld: 103 mg/dL — ABNORMAL HIGH (ref 65–99)
Potassium: 4.6 mmol/L (ref 3.5–5.3)
Sodium: 138 mmol/L (ref 135–146)
Total Bilirubin: 0.3 mg/dL (ref 0.2–1.2)
Total Protein: 7 g/dL (ref 6.1–8.1)

## 2020-08-17 LAB — CBC WITH DIFFERENTIAL/PLATELET
Absolute Monocytes: 917 cells/uL (ref 200–950)
Basophils Absolute: 111 cells/uL (ref 0–200)
Basophils Relative: 1.7 %
Eosinophils Absolute: 0 cells/uL — ABNORMAL LOW (ref 15–500)
Eosinophils Relative: 0 %
HCT: 35.7 % (ref 35.0–45.0)
Hemoglobin: 11.6 g/dL — ABNORMAL LOW (ref 11.7–15.5)
Lymphs Abs: 1508 cells/uL (ref 850–3900)
MCH: 30.3 pg (ref 27.0–33.0)
MCHC: 32.5 g/dL (ref 32.0–36.0)
MCV: 93.2 fL (ref 80.0–100.0)
MPV: 11.2 fL (ref 7.5–12.5)
Monocytes Relative: 14.1 %
Neutro Abs: 3965 cells/uL (ref 1500–7800)
Neutrophils Relative %: 61 %
Platelets: 330 10*3/uL (ref 140–400)
RBC: 3.83 10*6/uL (ref 3.80–5.10)
RDW: 13.6 % (ref 11.0–15.0)
Total Lymphocyte: 23.2 %
WBC: 6.5 10*3/uL (ref 3.8–10.8)

## 2020-08-17 LAB — PHOSPHORUS: Phosphorus: 3.7 mg/dL (ref 2.1–4.3)

## 2020-08-17 LAB — IRON,TIBC AND FERRITIN PANEL
%SAT: 40 % (calc) (ref 16–45)
Ferritin: 41 ng/mL (ref 16–288)
Iron: 109 ug/dL (ref 45–160)
TIBC: 274 mcg/dL (calc) (ref 250–450)

## 2020-08-17 LAB — SARS COV-2 SEROLOGY(COVID-19)AB(IGG,IGM),IMMUNOASSAY
SARS CoV-2 AB IgG: POSITIVE — AB
SARS CoV-2 IgM: NEGATIVE

## 2020-08-17 LAB — VITAMIN D 25 HYDROXY (VIT D DEFICIENCY, FRACTURES): Vit D, 25-Hydroxy: 31 ng/mL (ref 30–100)

## 2020-08-17 LAB — URINE CULTURE
MICRO NUMBER:: 12098359
SPECIMEN QUALITY:: ADEQUATE

## 2020-08-17 LAB — TSH: TSH: 0.11 mIU/L — ABNORMAL LOW (ref 0.40–4.50)

## 2020-08-17 LAB — VITAMIN B12: Vitamin B-12: 1945 pg/mL — ABNORMAL HIGH (ref 200–1100)

## 2020-08-17 LAB — MAGNESIUM, RBC: Magnesium RBC: 4.7 mg/dL (ref 4.0–6.4)

## 2020-08-17 MED ORDER — SULFAMETHOXAZOLE-TRIMETHOPRIM 800-160 MG PO TABS: 1.0000 | ORAL_TABLET | Freq: Two times a day (BID) | ORAL | 0 refills | Status: AC

## 2020-08-17 NOTE — Telephone Encounter (Signed)
pt  stated she received a call stating she has a UTI and  requesting an  RX sent [facility] her pharmacy [id number] [facility]  [phone]

## 2020-08-17 NOTE — Telephone Encounter (Signed)
Please see results note.

## 2020-08-21 MED ORDER — LEVOTHYROXINE SODIUM 50 MCG PO TABS: 50.0000 ug | ORAL_TABLET | Freq: Every day | ORAL | 1 refills | Status: DC

## 2020-08-21 NOTE — Addendum Note (Signed)
Addended by: Agnes Lawrence on: 08/21/2020 03:46 PM   Modules accepted: Orders

## 2020-08-23 ENCOUNTER — Other Ambulatory Visit: Payer: Self-pay | Admitting: Nurse Practitioner

## 2020-08-31 NOTE — Addendum Note (Signed)
Addended by: Agnes Lawrence on: 08/31/2020 04:46 PM   Modules accepted: Orders

## 2020-09-02 ENCOUNTER — Other Ambulatory Visit: Payer: Self-pay

## 2020-09-02 ENCOUNTER — Other Ambulatory Visit: Payer: Medicare Other

## 2020-09-02 DIAGNOSIS — N390 Urinary tract infection, site not specified: Secondary | ICD-10-CM

## 2020-09-03 LAB — URINE CULTURE
MICRO NUMBER:: 12170597
SPECIMEN QUALITY:: ADEQUATE

## 2020-10-13 ENCOUNTER — Encounter (HOSPITAL_BASED_OUTPATIENT_CLINIC_OR_DEPARTMENT_OTHER): Payer: Self-pay

## 2020-10-13 ENCOUNTER — Emergency Department (HOSPITAL_BASED_OUTPATIENT_CLINIC_OR_DEPARTMENT_OTHER): Payer: Medicare Other

## 2020-10-13 ENCOUNTER — Other Ambulatory Visit: Payer: Self-pay

## 2020-10-13 ENCOUNTER — Inpatient Hospital Stay (HOSPITAL_BASED_OUTPATIENT_CLINIC_OR_DEPARTMENT_OTHER)
Admission: EM | Admit: 2020-10-13 | Discharge: 2020-10-16 | DRG: 812 | Disposition: A | Discharge status: Home / Self Care | Payer: Medicare Other | Attending: Internal Medicine | Admitting: Internal Medicine

## 2020-10-13 DIAGNOSIS — R0609 Other forms of dyspnea: Secondary | ICD-10-CM | POA: Diagnosis not present

## 2020-10-13 DIAGNOSIS — Z8261 Family history of arthritis: Secondary | ICD-10-CM

## 2020-10-13 DIAGNOSIS — E039 Hypothyroidism, unspecified: Secondary | ICD-10-CM | POA: Diagnosis not present

## 2020-10-13 DIAGNOSIS — Z8711 Personal history of peptic ulcer disease: Secondary | ICD-10-CM

## 2020-10-13 DIAGNOSIS — J449 Chronic obstructive pulmonary disease, unspecified: Secondary | ICD-10-CM | POA: Diagnosis present

## 2020-10-13 DIAGNOSIS — I5022 Chronic systolic (congestive) heart failure: Secondary | ICD-10-CM | POA: Diagnosis present

## 2020-10-13 DIAGNOSIS — Z8279 Family history of other congenital malformations, deformations and chromosomal abnormalities: Secondary | ICD-10-CM

## 2020-10-13 DIAGNOSIS — D500 Iron deficiency anemia secondary to blood loss (chronic): Principal | ICD-10-CM | POA: Diagnosis present

## 2020-10-13 DIAGNOSIS — D649 Anemia, unspecified: Secondary | ICD-10-CM

## 2020-10-13 DIAGNOSIS — K5130 Ulcerative (chronic) rectosigmoiditis without complications: Secondary | ICD-10-CM

## 2020-10-13 DIAGNOSIS — Z91013 Allergy to seafood: Secondary | ICD-10-CM

## 2020-10-13 DIAGNOSIS — Z881 Allergy status to other antibiotic agents status: Secondary | ICD-10-CM | POA: Diagnosis not present

## 2020-10-13 DIAGNOSIS — Z8271 Family history of polycystic kidney: Secondary | ICD-10-CM

## 2020-10-13 DIAGNOSIS — Z800 Family history of malignant neoplasm of digestive organs: Secondary | ICD-10-CM | POA: Diagnosis not present

## 2020-10-13 DIAGNOSIS — Z8616 Personal history of COVID-19: Secondary | ICD-10-CM | POA: Diagnosis not present

## 2020-10-13 DIAGNOSIS — K51311 Ulcerative (chronic) rectosigmoiditis with rectal bleeding: Secondary | ICD-10-CM | POA: Diagnosis not present

## 2020-10-13 DIAGNOSIS — Z8052 Family history of malignant neoplasm of bladder: Secondary | ICD-10-CM

## 2020-10-13 DIAGNOSIS — Z888 Allergy status to other drugs, medicaments and biological substances status: Secondary | ICD-10-CM | POA: Diagnosis not present

## 2020-10-13 DIAGNOSIS — R531 Weakness: Secondary | ICD-10-CM

## 2020-10-13 DIAGNOSIS — Z66 Do not resuscitate: Secondary | ICD-10-CM | POA: Diagnosis present

## 2020-10-13 DIAGNOSIS — K5190 Ulcerative colitis, unspecified, without complications: Secondary | ICD-10-CM | POA: Diagnosis present

## 2020-10-13 DIAGNOSIS — Z8249 Family history of ischemic heart disease and other diseases of the circulatory system: Secondary | ICD-10-CM | POA: Diagnosis not present

## 2020-10-13 DIAGNOSIS — I10 Essential (primary) hypertension: Secondary | ICD-10-CM | POA: Diagnosis not present

## 2020-10-13 DIAGNOSIS — K449 Diaphragmatic hernia without obstruction or gangrene: Secondary | ICD-10-CM | POA: Diagnosis not present

## 2020-10-13 LAB — LACTATE DEHYDROGENASE: LDH: 128 U/L (ref 98–192)

## 2020-10-13 LAB — CBC WITH DIFFERENTIAL/PLATELET
Abs Immature Granulocytes: 0.03 10*3/uL (ref 0.00–0.07)
Basophils Absolute: 0.1 10*3/uL (ref 0.0–0.1)
Basophils Relative: 1 %
Eosinophils Absolute: 0 10*3/uL (ref 0.0–0.5)
Eosinophils Relative: 0 %
HCT: 23.5 % — ABNORMAL LOW (ref 36.0–46.0)
Hemoglobin: 7.6 g/dL — ABNORMAL LOW (ref 12.0–15.0)
Immature Granulocytes: 0 %
Lymphocytes Relative: 25 %
Lymphs Abs: 1.7 10*3/uL (ref 0.7–4.0)
MCH: 31.7 pg (ref 26.0–34.0)
MCHC: 32.3 g/dL (ref 30.0–36.0)
MCV: 97.9 fL (ref 80.0–100.0)
Monocytes Absolute: 0.8 10*3/uL (ref 0.1–1.0)
Monocytes Relative: 12 %
Neutro Abs: 4.1 10*3/uL (ref 1.7–7.7)
Neutrophils Relative %: 62 %
Platelets: 320 10*3/uL (ref 150–400)
RBC: 2.4 MIL/uL — ABNORMAL LOW (ref 3.87–5.11)
RDW: 15.3 % (ref 11.5–15.5)
WBC: 6.7 10*3/uL (ref 4.0–10.5)
nRBC: 0 % (ref 0.0–0.2)

## 2020-10-13 LAB — COMPREHENSIVE METABOLIC PANEL
ALT: 9 U/L (ref 0–44)
AST: 17 U/L (ref 15–41)
Albumin: 3.8 g/dL (ref 3.5–5.0)
Alkaline Phosphatase: 39 U/L (ref 38–126)
Anion gap: 8 (ref 5–15)
BUN: 20 mg/dL (ref 8–23)
CO2: 26 mmol/L (ref 22–32)
Calcium: 9.4 mg/dL (ref 8.9–10.3)
Chloride: 102 mmol/L (ref 98–111)
Creatinine, Ser: 0.65 mg/dL (ref 0.44–1.00)
GFR, Estimated: >60 mL/min (ref >60)
Glucose, Bld: 98 mg/dL (ref 70–99)
Potassium: 4.2 mmol/L (ref 3.5–5.1)
Sodium: 136 mmol/L (ref 135–145)
Total Bilirubin: 0.2 mg/dL — ABNORMAL LOW (ref 0.3–1.2)
Total Protein: 6.2 g/dL — ABNORMAL LOW (ref 6.5–8.1)

## 2020-10-13 LAB — URINALYSIS, ROUTINE W REFLEX MICROSCOPIC
Bilirubin Urine: NEGATIVE
Glucose, UA: NEGATIVE mg/dL
Hgb urine dipstick: NEGATIVE
Ketones, ur: NEGATIVE mg/dL
Nitrite: POSITIVE — AB
Protein, ur: NEGATIVE mg/dL
Specific Gravity, Urine: 1.016 (ref 1.005–1.030)
pH: 7 (ref 5.0–8.0)

## 2020-10-13 LAB — RETICULOCYTES
Immature Retic Fract: 29.3 % — ABNORMAL HIGH (ref 2.3–15.9)
RBC.: 2.2 MIL/uL — ABNORMAL LOW (ref 3.87–5.11)
Retic Count, Absolute: 107.4 10*3/uL (ref 19.0–186.0)
Retic Ct Pct: 4.9 % — ABNORMAL HIGH (ref 0.4–3.1)

## 2020-10-13 LAB — RESP PANEL BY RT-PCR (FLU A&B, COVID) ARPGX2
Influenza A by PCR: NEGATIVE
Influenza B by PCR: NEGATIVE
SARS Coronavirus 2 by RT PCR: NEGATIVE

## 2020-10-13 LAB — TSH: TSH: 3.936 u[IU]/mL (ref 0.350–4.500)

## 2020-10-13 LAB — OCCULT BLOOD X 1 CARD TO LAB, STOOL: Fecal Occult Bld: NEGATIVE

## 2020-10-13 MED ORDER — ALBUTEROL SULFATE (2.5 MG/3ML) 0.083% IN NEBU: 3.0000 mL | INHALATION_SOLUTION | RESPIRATORY_TRACT | Status: DC | PRN

## 2020-10-13 MED ORDER — SODIUM CHLORIDE 0.9 % IV SOLN: INTRAVENOUS | Status: DC

## 2020-10-13 MED ORDER — BUDESONIDE 0.25 MG/2ML IN SUSP
0.2500 mg | Freq: Two times a day (BID) | RESPIRATORY_TRACT | Status: DC
  Administered 2020-10-14 – 2020-10-15 (×4): 0.25 mg via RESPIRATORY_TRACT
  Filled 2020-10-13 (×6): qty 2

## 2020-10-13 MED ORDER — ACETAMINOPHEN 650 MG RE SUPP: 650.0000 mg | Freq: Four times a day (QID) | RECTAL | Status: DC | PRN

## 2020-10-13 MED ORDER — ACETAMINOPHEN 325 MG PO TABS
650.0000 mg | ORAL_TABLET | Freq: Once | ORAL | Status: AC
  Administered 2020-10-13: 650 mg via ORAL
  Filled 2020-10-13: qty 2

## 2020-10-13 MED ORDER — SODIUM CHLORIDE 0.9% IV SOLUTION: Freq: Once | INTRAVENOUS | Status: AC

## 2020-10-13 MED ORDER — LEVOTHYROXINE SODIUM 50 MCG PO TABS
50.0000 ug | ORAL_TABLET | Freq: Every day | ORAL | Status: DC
  Administered 2020-10-14 – 2020-10-16 (×3): 50 ug via ORAL
  Filled 2020-10-13 (×3): qty 1

## 2020-10-13 MED ORDER — MESALAMINE 1.2 G PO TBEC
2.4000 g | DELAYED_RELEASE_TABLET | Freq: Every day | ORAL | Status: DC
  Administered 2020-10-14 – 2020-10-16 (×3): 2.4 g via ORAL
  Filled 2020-10-13 (×3): qty 2

## 2020-10-13 MED ORDER — ACETAMINOPHEN 325 MG PO TABS
650.0000 mg | ORAL_TABLET | Freq: Four times a day (QID) | ORAL | Status: DC | PRN
  Administered 2020-10-14 – 2020-10-16 (×5): 650 mg via ORAL
  Filled 2020-10-13 (×5): qty 2

## 2020-10-13 MED ORDER — ONDANSETRON HCL 4 MG/2ML IJ SOLN: 4.0000 mg | Freq: Four times a day (QID) | INTRAMUSCULAR | Status: DC | PRN

## 2020-10-13 MED ORDER — ENOXAPARIN SODIUM 40 MG/0.4ML IJ SOSY
40.0000 mg | PREFILLED_SYRINGE | INTRAMUSCULAR | Status: DC
  Administered 2020-10-14 – 2020-10-16 (×3): 40 mg via SUBCUTANEOUS
  Filled 2020-10-13 (×2): qty 0.4

## 2020-10-13 MED ORDER — ONDANSETRON HCL 4 MG PO TABS: 4.0000 mg | ORAL_TABLET | Freq: Four times a day (QID) | ORAL | Status: DC | PRN

## 2020-10-13 NOTE — ED Notes (Signed)
Called [facility] for consult with hospitalist spoke with Maudie Mercury

## 2020-10-13 NOTE — ED Notes (Signed)
Handoff report given to [facility] 

## 2020-10-13 NOTE — H&P (Signed)
History and Physical    AZALEA CEDAR AVW:[id number] DOB: 08/24/1934 DOA: 10/13/2020  PCP: Caren Macadam, MD   Patient coming from: home  Chief Complaint: Fatigue, generalized weakness   HPI: [name] is a 86 y.o. female with medical history significant for chronic ulcerative rectosigmoiditis, hypothyroidism, and COPD, now presenting to emergency department for evaluation of profound fatigue and general weakness.  The patient reports insidious development of fatigue over a couple weeks with significant worsening over the past day.  Today, she had difficulty tolerating any activity, stating that she felt exhausted just trying to bathe.  She denies any abdominal pain, nausea, vomiting, diarrhea, melena, or hematochezia.  She denies chest pain, fevers, chills, or dysuria.  No significant change in her mild chronic cough.  [facility] course: Upon arrival to the ED, patient is found to be afebrile, saturating well on room air, and with stable blood pressure.  Orthostatic vitals were negative.  EKG with sinus rhythm.  Chest x-ray negative for acute cardiopulmonary disease.  Chemistry panel is unremarkable and CBC notable for hemoglobin of 7.6, down from 11.6 in July.  COVID and influenza PCR negative.  Fecal occult blood testing is negative.  TSH is normal.  Patient was given IV fluids and acetaminophen.  She was transferred to [facility] for ongoing evaluation and management.  Review of Systems:  All other systems reviewed and apart from HPI, are negative.  Past Medical History:  Diagnosis Date   ABDOMINAL PAIN, CHRONIC 05/28/2008   ALLERGIC RHINITIS 09/21/2006   ANEMIA-NOS 09/21/2006   ASTHMA 09/21/2006   ASYMPTOMATIC POSTMENOPAUSAL STATUS 05/28/2008   Cataract    CHEST PAIN, ATYPICAL 05/28/2008   COPD (chronic obstructive pulmonary disease) ([place])    COVID-19 virus infection 02/2020   DDD (degenerative disc disease)    DIVERTICULITIS, HX OF 09/21/2006   GERD 09/21/2006    HYPERCHOLESTEROLEMIA 06/05/2009   HYPERTENSION 09/21/2006   HYPOTHYROIDISM 09/21/2006   Peptic ulcer disease    Purpura ([place])    RENAL INSUFFICIENCY 09/21/2006   Shingles    Small bowel obstruction (HCC)    Somatization disorder 04/25/2007   UNSPECIFIED URINARY CALCULUS 05/28/2008   UTI (urinary tract infection) 07/2019    Past Surgical History:  Procedure Laterality Date   ABDOMINAL HYSTERECTOMY  1981   acute Nephritis  [name]   BIOPSY  11/18/2019   Procedure: BIOPSY;  Surgeon: Ladene Artist, MD;  Location: [facility] ENDOSCOPY;  Service: Endoscopy;;   [place] N/A 11/18/2019   Procedure: FLEXIBLE SIGMOIDOSCOPY;  Surgeon: Ladene Artist, MD;  Location: [facility]OS[facility];  Service: Endoscopy;  Laterality: N/A;   NASAL SINUS SURGERY  1967   Pulmonary thrombosis     TONSILLECTOMY     TUBAL LIGATION      Social History:   reports that she has never smoked. She has never used smokeless tobacco. She reports that she does not drink alcohol and does not use drugs.  Allergies  Allergen Reactions   [place] Nut Daisey Must) Skin Test Anaphylaxis   Fluconazole Shortness Of Breath   Metronidazole Shortness Of Breath and Nausea And Vomiting   Pepcid [Famotidine] Shortness Of Breath and Other (See Comments)    Dizziness   Shellfish Allergy Anaphylaxis   Macrobid [Nitrofurantoin]     REACTION: Syncope   Pantoprazole Other (See Comments)  CHEST PAIN    Family History  Problem Relation Age of Onset   Polycystic kidney disease Mother    Pancreatic cancer Mother    Other Father        Schamberg disease   Bladder Cancer Father    Hypertension Father    Marfan syndrome Son    Hemochromatosis Son    Cirrhosis Son    Allergic rhinitis Sister    Other Brother        bone issue as child; multiple fractures but seemed to age out of this   Hemochromatosis Cousin     Arthritis Sister    Colon cancer Neg Hx    Esophageal cancer Neg Hx    Stomach cancer Neg Hx      Prior to Admission medications   Medication Sig Start Date End Date Taking? Authorizing Provider  Cholecalciferol (VITAMIN D3 PO) Take 1 tablet by mouth 2 (two) times a week. Saturday and Sunday   Yes [provider]  Ferrous Sulfate (IRON) 325 (65 FE) MG TABS Take 1 tablet by mouth daily.    Yes [provider]  fluticasone (FLOVENT HFA) 110 MCG/ACT inhaler Take 2 puffs by mouth twice daily 08/14/20  Yes Koberlein, Junell C, MD  levothyroxine (SYNTHROID) 50 MCG tablet Take 1 tablet (50 mcg total) by mouth daily. 08/21/20  Yes Koberlein, Junell C, MD  mesalamine (LIALDA) 1.2 g EC tablet TAKE 2 TABLETS BY MOUTH DAILY WITH BREAKFAST. 08/24/20  Yes Willia Craze, NP  acetaminophen (TYLENOL) 325 MG tablet Take 650 mg by mouth every 4 (four) hours as needed for mild pain or headache.     [provider]  Spacer/Aero-Holding Chambers (E-Z SPACER) inhaler Use as instructed 11/08/17   Caren Macadam, MD    Physical Exam: Vitals:   10/13/20 1730 10/13/20 2000 10/13/20 2015 10/13/20 2227  BP: (!) 126/49 140/63  (!) 159/66  Pulse:  74 75 68  Resp: 20 14 14 18   Temp:    98.3 F (36.8 C)  TempSrc:    Oral  SpO2: 100% 97% 99% 98%    Constitutional: NAD, calm  Eyes: PERTLA, lids and conjunctivae normal ENMT: Mucous membranes are moist. Posterior pharynx clear of any exudate or lesions.   Neck: supple, no masses  Respiratory: no wheezing, no crackles. No accessory muscle use.  Cardiovascular: S1 & S2 heard, regular rate and rhythm. No extremity edema.   Abdomen: No distension, no tenderness, soft. Bowel sounds active.  Musculoskeletal: no clubbing / cyanosis. No joint deformity upper and lower extremities.   Skin: no significant rashes, lesions, ulcers. Warm, dry, well-perfused. Neurologic: CN 2-12 grossly intact. Sensation intact. Moving all extremties.   Psychiatric: Alert and oriented to person, place, and situation. Pleasant and cooperative.    Labs and Imaging on Admission: I have personally reviewed following labs and imaging studies  CBC: Recent Labs  Lab 10/13/20 1554  WBC 6.7  NEUTROABS 4.1  HGB 7.6*  HCT 23.5*  MCV 97.9  PLT 834   Basic Metabolic Panel: Recent Labs  Lab 10/13/20 1554  NA 136  K 4.2  CL 102  CO2 26  GLUCOSE 98  BUN 20  CREATININE 0.65  CALCIUM 9.4   GFR: CrCl cannot be calculated (Unknown ideal weight.). Liver Function Tests: Recent Labs  Lab 10/13/20 1554  AST 17  ALT 9  ALKPHOS 39  BILITOT 0.2*  PROT 6.2*  ALBUMIN 3.8   No results for input(s): LIPASE, AMYLASE in the last  168 hours. No results for input(s): AMMONIA in the last 168 hours. Coagulation Profile: No results for input(s): INR, PROTIME in the last 168 hours. Cardiac Enzymes: No results for input(s): CKTOTAL, CKMB, CKMBINDEX, TROPONINI in the last 168 hours. BNP (last 3 results) No results for input(s): PROBNP in the last 8760 hours. HbA1C: No results for input(s): HGBA1C in the last 72 hours. CBG: No results for input(s): GLUCAP in the last 168 hours. Lipid Profile: No results for input(s): CHOL, HDL, LDLCALC, TRIG, CHOLHDL, LDLDIRECT in the last 72 hours. Thyroid Function Tests: Recent Labs    10/13/20 1554  TSH 3.936   Anemia Panel: No results for input(s): VITAMINB12, FOLATE, FERRITIN, TIBC, IRON, RETICCTPCT in the last 72 hours. Urine analysis:    Component Value Date/Time   COLORURINE YELLOW 10/13/2020 1836   APPEARANCEUR CLEAR 10/13/2020 1836   LABSPEC 1.016 10/13/2020 1836   PHURINE 7.0 10/13/2020 1836   GLUCOSEU NEGATIVE 10/13/2020 1836   GLUCOSEU NEGATIVE 11/21/2016 1510   HGBUR NEGATIVE 10/13/2020 1836   BILIRUBINUR NEGATIVE 10/13/2020 1836   BILIRUBINUR negative 09/12/2019 1030   KETONESUR NEGATIVE 10/13/2020 1836   PROTEINUR NEGATIVE 10/13/2020 1836   UROBILINOGEN 0.2 09/12/2019 1030    UROBILINOGEN 0.2 06/26/2017 1546   NITRITE POSITIVE (A) 10/13/2020 1836   LEUKOCYTESUR SMALL (A) 10/13/2020 1836   Sepsis Labs: @LABRCNTIP (procalcitonin:4,lacticidven:4) ) Recent Results (from the past 240 hour(s))  Resp Panel by RT-PCR (Flu A&B, Covid) Nasopharyngeal Swab     Status: None   Collection Time: 10/13/20  5:44 PM   Specimen: Nasopharyngeal Swab; Nasopharyngeal(NP) swabs in vial transport medium  Result Value Ref Range Status   SARS Coronavirus 2 by RT PCR NEGATIVE NEGATIVE Final    Comment: (NOTE) SARS-CoV-2 target nucleic acids are NOT DETECTED.  The SARS-CoV-2 RNA is generally detectable in upper respiratory specimens during the acute phase of infection. The lowest concentration of SARS-CoV-2 viral copies this assay can detect is 138 copies/mL. A negative result does not preclude SARS-Cov-2 infection and should not be used as the sole basis for treatment or other patient management decisions. A negative result may occur with  improper specimen collection/handling, submission of specimen other than nasopharyngeal swab, presence of viral mutation(s) within the areas targeted by this assay, and inadequate number of viral copies(<138 copies/mL). A negative result must be combined with clinical observations, patient history, and epidemiological information. The expected result is Negative.  Fact Sheet for Patients:  EntrepreneurPulse.com.au  Fact Sheet for Healthcare Providers:  IncredibleEmployment.be  This test is no t yet approved or cleared by the [place] FDA and  has been authorized for detection and/or diagnosis of SARS-CoV-2 by FDA under an Emergency Use Authorization (EUA). This EUA will remain  in effect (meaning this test can be used) for the duration of the COVID-19 declaration under Section 564(b)(1) of the Act, 21 U.S.C.section 360bbb-3(b)(1), unless the authorization is terminated  or revoked sooner.        Influenza A by PCR NEGATIVE NEGATIVE Final   Influenza B by PCR NEGATIVE NEGATIVE Final    Comment: (NOTE) The Xpert Xpress SARS-CoV-2/FLU/RSV plus assay is intended as an aid in the diagnosis of influenza from Nasopharyngeal swab specimens and should not be used as a sole basis for treatment. Nasal washings and aspirates are unacceptable for Xpert Xpress SARS-CoV-2/FLU/RSV testing.  Fact Sheet for Patients: EntrepreneurPulse.com.au  Fact Sheet for Healthcare Providers: IncredibleEmployment.be  This test is not yet approved or cleared by the [place] and has been authorized  for detection and/or diagnosis of SARS-CoV-2 by FDA under an Emergency Use Authorization (EUA). This EUA will remain in effect (meaning this test can be used) for the duration of the COVID-19 declaration under Section 564(b)(1) of the Act, 21 U.S.C. section 360bbb-3(b)(1), unless the authorization is terminated or revoked.  Performed at [facility], [address], [place], [place] [address]      Radiological Exams on Admission: DG Chest 2 View  Result Date: 10/13/2020 CLINICAL DATA:  Weakness EXAM: CHEST - 2 VIEW COMPARISON:  03/11/2020 FINDINGS: Stable cardiomediastinal contours. Large hiatal hernia. No pleural effusion or edema. No airspace densities. Scarring noted within the left upper lobe, similar to previous exam. IMPRESSION: 1. No acute cardiopulmonary abnormalities. 2. Large hiatal hernia. Electronically Signed   By: Kerby Moors M.D.   On: 10/13/2020 16:08    EKG: Independently reviewed. Sinus rhythm.   Assessment/Plan   1. Symptomatic anemia  - Patient with ulcerative colitis and chronic anemia presents with fatigue and general weakness and is found to have Hgb 7.6, down from 11.6 two months ago without evidence for GI or other bleeding  - Check smear, reticulocytes, LDH, haptoglobin, B12, folate, and iron studies  - Transfuse 1  unit RBC and check post-transfusion H&H   2. COPD  - No cough or wheeze on admission  - Continue ICS and as needed SABA    3. Ulcerative rectosigmoiditis   - No recent GI symptoms    - Continue mesalamine once daily    4. Hypothyroidism  - TSH normal, continue current dose Synthroid     DVT prophylaxis: Lovenox  Code Status: DNR  Level of Care: Level of care: Med-Surg Family Communication: None present  Disposition Plan:  Patient is from: Home  Anticipated d/c is to: Home  Anticipated d/c date is: 10/14/20 Patient currently: Pending blood transfusion, repeat H&H, improvement in weakness/fatigue  Consults called: none  Admission status: Inpatient     Vianne Bulls, MD [facility]  10/13/2020, 11:00 PM

## 2020-10-13 NOTE — ED Triage Notes (Signed)
She c/o feeling profound "weakness" since yesterday. She describes feeling "like I'm going to pass out" (but hasn't passed out). She is in no distress.

## 2020-10-13 NOTE — ED Notes (Signed)
Handoff report given to Hailey RN on  [facility]

## 2020-10-13 NOTE — ED Notes (Signed)
[facility] at bedside

## 2020-10-13 NOTE — ED Provider Notes (Signed)
[facility] EMERGENCY DEPT Provider Note   CSN: [id number] Arrival date & time: 10/13/20  1455     History Chief Complaint  Patient presents with   Weakness    [name] is a 86 y.o. female.  86year-old female presents with weakness that began x24 hours.  States that she feels that she has low energy.  Does have history of hypothyroid and her medication has been adjusted in the past.  Denies any fever, chest pain, shortness of breath.  No abdominal discomfort.  No urinary symptoms.  No new cough or congestion.  Does have history of COPD.  No new medications.  No prior history of same      Past Medical History:  Diagnosis Date   ABDOMINAL PAIN, CHRONIC 05/28/2008   ALLERGIC RHINITIS 09/21/2006   ANEMIA-NOS 09/21/2006   ASTHMA 09/21/2006   ASYMPTOMATIC POSTMENOPAUSAL STATUS 05/28/2008   Cataract    CHEST PAIN, ATYPICAL 05/28/2008   COPD (chronic obstructive pulmonary disease) ([place])    COVID-19 virus infection 02/2020   DDD (degenerative disc disease)    DIVERTICULITIS, HX OF 09/21/2006   GERD 09/21/2006   HYPERCHOLESTEROLEMIA 06/05/2009   HYPERTENSION 09/21/2006   HYPOTHYROIDISM 09/21/2006   Peptic ulcer disease    Purpura ([place])    RENAL INSUFFICIENCY 09/21/2006   Shingles    Small bowel obstruction (HCC)    Somatization disorder 04/25/2007   UNSPECIFIED URINARY CALCULUS 05/28/2008   UTI (urinary tract infection) 07/2019    Patient Active Problem List   Diagnosis Date Noted   GIB (gastrointestinal bleeding) 03/11/2020   COVID-19 virus infection 02/2020   Chronic ulcerative rectosigmoiditis with rectal bleeding (HCC)    Rectal bleeding    Abnormal CT scan, gastrointestinal tract    Lower GI bleed 11/16/2019   LLQ abdominal pain 10/31/2019   Diverticulitis 10/31/2019   UTI (urinary tract infection) 08/06/2019   COPD (chronic obstructive pulmonary disease) (HCC)    Colitis    Vitamin D deficiency 05/01/2019   Arthralgia 08/21/2017   Osteoporosis 11/21/2016    Postmenopausal status 09/06/2016   Other nonspecific abnormal finding of lung field 08/14/2015   Hyponatremia 07/21/2014   Menopausal state 07/18/2014   Hematest positive stools 08/05/2013   Anemia, blood loss 08/05/2013   Cerumen impaction 06/03/2013   History of adrenal disorder 10/15/2012   Rash and nonspecific skin eruption 10/15/2012   Iron deficiency anemia 06/20/2012   Psychogenic polydipsia 06/20/2012   Nonspecific abnormal electrocardiogram (ECG) (EKG) 06/20/2012   Polyuria 05/02/2012   Pelvic pain in female 08/16/2011   Leg ulcer ([place]) 06/16/2010   Routine general medical examination at a health care facility 06/11/2010   Encounter for long-term (current) use of other medications 06/11/2010   HYPERCHOLESTEROLEMIA 06/05/2009   UNSPECIFIED URINARY CALCULUS 05/28/2008   CHEST PAIN, ATYPICAL 05/28/2008   ABDOMINAL PAIN, CHRONIC 05/28/2008   ASYMPTOMATIC POSTMENOPAUSAL STATUS 05/28/2008   SOMATIZATION DISORDER 04/25/2007   Hypothyroidism 09/21/2006   Essential hypertension 09/21/2006   ALLERGIC RHINITIS 09/21/2006   ASTHMA 09/21/2006   GERD 09/21/2006   Disorder resulting from impaired renal function 09/21/2006   DIVERTICULITIS, HX OF 09/21/2006    Past Surgical History:  Procedure Laterality Date   ABDOMINAL HYSTERECTOMY  1981   acute Nephritis  [place]   BIOPSY  11/18/2019   Procedure: BIOPSY;  Surgeon: Ladene Artist, MD;  Location: [facility];  Service: Endoscopy;;   [place] N/A 11/18/2019   Procedure: FLEXIBLE SIGMOIDOSCOPY;  Surgeon: Ladene Artist, MD;  Location: [facility];  Service: Endoscopy;  Laterality: N/A;   NASAL SINUS SURGERY  1967   Pulmonary thrombosis     TONSILLECTOMY     TUBAL LIGATION       OB History     Gravida  3   Para      Term      Preterm      AB      Living  2      SAB      IAB       Ectopic      Multiple      Live Births              Family History  Problem Relation Age of Onset   Polycystic kidney disease Mother    Pancreatic cancer Mother    Other Father        Schamberg disease   Bladder Cancer Father    Hypertension Father    Marfan syndrome Son    Hemochromatosis Son    Cirrhosis Son    Allergic rhinitis Sister    Other Brother        bone issue as child; multiple fractures but seemed to age out of this   Hemochromatosis Cousin    Arthritis Sister    Colon cancer Neg Hx    Esophageal cancer Neg Hx    Stomach cancer Neg Hx     Social History   Tobacco Use   Smoking status: Never   Smokeless tobacco: Never  Vaping Use   Vaping Use: Never used  Substance Use Topics   Alcohol use: No   Drug use: No    Home Medications Prior to Admission medications   Medication Sig Start Date End Date Taking? Authorizing Provider  acetaminophen (TYLENOL) 325 MG tablet Take 650 mg by mouth every 4 (four) hours as needed for mild pain or headache.     [provider]  Cholecalciferol (VITAMIN D3 PO) Take 1 tablet by mouth 2 (two) times a week. Saturday and Sunday    [provider]  Ferrous Sulfate (IRON) 325 (65 FE) MG TABS Take 1 tablet by mouth daily.     [provider]  fluticasone (FLOVENT HFA) 110 MCG/ACT inhaler Take 2 puffs by mouth twice daily 08/14/20   Koberlein, Steele Berg, MD  levothyroxine (SYNTHROID) 50 MCG tablet Take 1 tablet (50 mcg total) by mouth daily. 08/21/20   Koberlein, Steele Berg, MD  mesalamine (LIALDA) 1.2 g EC tablet TAKE 2 TABLETS BY MOUTH DAILY WITH BREAKFAST. 08/24/20   Willia Craze, NP  Spacer/Aero-Holding Josiah Lobo (E-Z SPACER) inhaler Use as instructed 11/08/17   Caren Macadam, MD    Allergies    [place] nut (berthollefia excelsa) skin test, Fluconazole, Metronidazole, Pepcid [famotidine], Shellfish allergy, Macrobid [nitrofurantoin], and Pantoprazole  Review of Systems   Review of Systems   All other systems reviewed and are negative.  Physical Exam Updated Vital Signs BP (!) 144/65 (BP Location: Left Arm)   Pulse 88   Temp 98.5 F (36.9 C)   Resp 20   SpO2 99%   Physical Exam Vitals and nursing note reviewed.  Constitutional:      General: She is not in acute distress.    Appearance: Normal appearance. She is well-developed. She is not toxic-appearing.  HENT:     Head: Normocephalic and  atraumatic.  Eyes:     General: Lids are normal.     Conjunctiva/sclera: Conjunctivae normal.     Pupils: Pupils are equal, round, and reactive to light.  Neck:     Thyroid: No thyroid mass.     Trachea: No tracheal deviation.  Cardiovascular:     Rate and Rhythm: Normal rate and regular rhythm.     Heart sounds: Normal heart sounds. No murmur heard.   No gallop.  Pulmonary:     Effort: Pulmonary effort is normal. No respiratory distress.     Breath sounds: Normal breath sounds. No stridor. No decreased breath sounds, wheezing, rhonchi or rales.  Abdominal:     General: There is no distension.     Palpations: Abdomen is soft.     Tenderness: There is no abdominal tenderness. There is no rebound.  Musculoskeletal:        General: No tenderness. Normal range of motion.     Cervical back: Normal range of motion and neck supple.  Skin:    General: Skin is warm and dry.     Findings: No abrasion or rash.  Neurological:     Mental Status: She is alert and oriented to person, place, and time. Mental status is at baseline.     GCS: GCS eye subscore is 4. GCS verbal subscore is 5. GCS motor subscore is 6.     Cranial Nerves: Cranial nerves are intact. No cranial nerve deficit.     Sensory: No sensory deficit.     Motor: Motor function is intact.  Psychiatric:        Attention and Perception: Attention normal.        Speech: Speech normal.        Behavior: Behavior normal.    ED Results / Procedures / Treatments   Labs (all labs ordered are listed, but only abnormal  results are displayed) Labs Reviewed  CBC WITH DIFFERENTIAL/PLATELET  COMPREHENSIVE METABOLIC PANEL  URINALYSIS, ROUTINE W REFLEX MICROSCOPIC  TSH    EKG EKG Interpretation  Date/Time:  Tuesday October 13 2020 15:22:36 EDT Ventricular Rate:  82 PR Interval:  165 QRS Duration: 86 QT Interval:  381 QTC Calculation: 445 R Axis:   43 Text Interpretation: Sinus rhythm Abnormal R-wave progression, early transition Confirmed by Lacretia Leigh ([id number]) on 10/13/2020 3:41:22 PM  Radiology No results found.  Procedures Procedures   Medications Ordered in ED Medications  0.9 %  sodium chloride infusion (has no administration in time range)    ED Course  I have reviewed the triage vital signs and the nursing notes.  Pertinent labs & imaging results that were available during my care of the patient were reviewed by me and considered in my medical decision making (see chart for details).    MDM Rules/Calculators/A&P                           Patient has evidence of anemia with hemoglobin of 7.6.  This is about a 4 g drop from prior.  She has guaiac negative stools here.  Does have a history of having her intestines removed and has had trouble with nutrition.  Suspect this might be part of the reason why she is anemic.  States that she had COVID earlier this year and required blood transfusions at that time.  Will admit to the hospital service Final Clinical Impression(s) / ED Diagnoses Final diagnoses:  Weakness    Rx / DC  Orders ED Discharge Orders     None        Lacretia Leigh, MD 10/13/20 [phone]

## 2020-10-13 NOTE — ED Notes (Signed)
ua obtained and sent to lab.

## 2020-10-13 NOTE — ED Notes (Signed)
Son has pts medical alert necklace to take home.

## 2020-10-14 LAB — CBC WITH DIFFERENTIAL/PLATELET
Abs Immature Granulocytes: 0.04 10*3/uL (ref 0.00–0.07)
Basophils Absolute: 0.1 10*3/uL (ref 0.0–0.1)
Basophils Relative: 1 %
Eosinophils Absolute: 0 10*3/uL (ref 0.0–0.5)
Eosinophils Relative: 0 %
HCT: 22.1 % — ABNORMAL LOW (ref 36.0–46.0)
Hemoglobin: 7.1 g/dL — ABNORMAL LOW (ref 12.0–15.0)
Immature Granulocytes: 1 %
Lymphocytes Relative: 35 %
Lymphs Abs: 2.5 10*3/uL (ref 0.7–4.0)
MCH: 31.7 pg (ref 26.0–34.0)
MCHC: 32.1 g/dL (ref 30.0–36.0)
MCV: 98.7 fL (ref 80.0–100.0)
Monocytes Absolute: 0.9 10*3/uL (ref 0.1–1.0)
Monocytes Relative: 12 %
Neutro Abs: 3.6 10*3/uL (ref 1.7–7.7)
Neutrophils Relative %: 51 %
Platelets: 311 10*3/uL (ref 150–400)
RBC: 2.24 MIL/uL — ABNORMAL LOW (ref 3.87–5.11)
RDW: 15.5 % (ref 11.5–15.5)
WBC: 7 10*3/uL (ref 4.0–10.5)
nRBC: 0 % (ref 0.0–0.2)

## 2020-10-14 LAB — IRON AND TIBC
Iron: 150 ug/dL (ref 28–170)
Saturation Ratios: 52 % — ABNORMAL HIGH (ref 10.4–31.8)
TIBC: 286 ug/dL (ref 250–450)
UIBC: 136 ug/dL

## 2020-10-14 LAB — VITAMIN B12: Vitamin B-12: 477 pg/mL (ref 180–914)

## 2020-10-14 LAB — BASIC METABOLIC PANEL
Anion gap: 5 (ref 5–15)
BUN: 21 mg/dL (ref 8–23)
CO2: 24 mmol/L (ref 22–32)
Calcium: 8.6 mg/dL — ABNORMAL LOW (ref 8.9–10.3)
Chloride: 106 mmol/L (ref 98–111)
Creatinine, Ser: 0.5 mg/dL (ref 0.44–1.00)
GFR, Estimated: >60 mL/min (ref >60)
Glucose, Bld: 92 mg/dL (ref 70–99)
Potassium: 4.1 mmol/L (ref 3.5–5.1)
Sodium: 135 mmol/L (ref 135–145)

## 2020-10-14 LAB — FERRITIN: Ferritin: 23 ng/mL (ref 11–307)

## 2020-10-14 LAB — GLUCOSE, CAPILLARY: Glucose-Capillary: 98 mg/dL (ref 70–99)

## 2020-10-14 LAB — PREPARE RBC (CROSSMATCH)

## 2020-10-14 LAB — PATHOLOGIST SMEAR REVIEW

## 2020-10-14 LAB — FOLATE: Folate: 16.2 ng/mL (ref >5.9)

## 2020-10-14 MED ORDER — SODIUM CHLORIDE 0.9 % IV SOLN
1.0000 g | Freq: Every day | INTRAVENOUS | Status: DC
  Administered 2020-10-14: 1 g via INTRAVENOUS
  Filled 2020-10-14: qty 1

## 2020-10-14 NOTE — Progress Notes (Signed)
PROGRESS NOTE  SKILYNN DURNEY  DOB: 06/23/1934  PCP: Caren Macadam, MD KNL:[id number]  DOA: 10/13/2020  LOS: 1 day  Hospital Day: 2   Chief Complaint  Patient presents with   Weakness    Brief narrative: [name] is a 86 y.o. female with PMH significant for chronic ulcerative rectosigmoiditis, hypothyroidism, and COPD. Patient presented to the ED on 9/6 with complaint of profound fatigue and generalized weakness that developed over couple of weeks with significant worsening in the past 1 to 2 days.  In the ED, patient was afebrile and hemodynamically stable.  Orthostatic vital signs were negative. Labs showed hemoglobin low at 7.6, down from 11.6 in July 2022. FOBT negative. Urinalysis with clear yellow urine, small leukocytes and positive nitrite Patient was admitted to hospital service for further evaluation management  Subjective: Patient was seen and examined this morning.  Pleasant elderly Caucasian female.  Lying on bed.  Feels weak. Chart reviewed Remains hemodynamically stable this morning Labs this morning with hemoglobin down to 7.1  Assessment/Plan: Acute on chronic anemia -It seems that patient has chronic anemia with hemoglobin between 8 and 9 for last several months.  But it was high at 11.6 in July 2022.  Presented with low hemoglobin at 7.6.  1 unit of PRBC was transfused despite which hemoglobin dropped down to 7.1.  No evidence of bleeding however.  Repeat CBC tomorrow. -Ferritin level 23, reticulocyte count up at 4.9, LDH normal, folate and vitamin B12 level normal -Suspect chronic slow blood loss, likely cause GI bleeding due to underlying ulcerative colitis.  Recent Labs    11/17/19 0506 11/18/19 0545 03/14/20 0416 03/15/20 0355 03/16/20 0303 03/19/20 0350 04/28/20 0941 08/14/20 1618 10/13/20 1554 10/13/20 2325 10/13/20 2335  HGB 10.1*   < > 10.1* 8.9* 8.6* 8.7* 7.1* 11.6* 7.6* 7.1*  --   MCV 92.7   < > 93.5 95.2 93.4 94.8 89.5 93.2 97.9  98.7  --   VITAMINB12 431  --   --   --   --   --   --  1,945*  --   --  477  FOLATE 43.4  --   --   --   --   --   --   --   --   --  16.2  FERRITIN 51   < > 27 29 33  --   --  41  --   --  23  TIBC 310  --   --   --   --   --   --  274  --   --  286  IRON 50  --   --   --   --   --   --  109  --   --  150  RETICCTPCT 1.3  --   --   --   --   --   --   --   --   --  4.9*   < > = values in this interval not displayed.   Chronic ulcerative rectosigmoiditis -Continue mesalamine daily.  No obvious GI bleeding.  FOBT negative.   -Follows up with GI at [facility].   UTI -presented with profound weakness. -Urinalysis with positive nitrite.  Obtain urine culture. -Empirically start on IV Rocephin.  COPD  -Respiratory status stable.  Continue inhalers.   Hypothyroidism  -TSH normal, continue current dose of Synthroid      Mobility: Encourage ambulation.  PT eval ordered. Code Status:  Code Status: DNR  Nutritional status: There is no height or weight on file to calculate BMI.     Diet:  Diet Order             Diet Heart Room service appropriate? Yes; Fluid consistency: Thin  Diet effective now                  DVT prophylaxis:  enoxaparin (LOVENOX) injection 40 mg Start: 10/14/20 1000   Antimicrobials: IV Rocephin Fluid: None Consultants: None Family Communication: None at bedside  Status is: Inpatient  Remains inpatient appropriate because: May need further blood transfusion, needs IV antibiotics  Dispo: The patient is from: Home              Anticipated d/c is to: Home in 1 to 2 days              Patient currently is not medically stable to d/c.   Difficult to place patient No     Infusions:   cefTRIAXone (ROCEPHIN)  IV 1 g (10/14/20 1039)    Scheduled Meds:  budesonide  0.25 mg Nebulization BID   enoxaparin (LOVENOX) injection  40 mg Subcutaneous Q24H   levothyroxine  50 mcg Oral Q0600   mesalamine  2.4 g Oral Q breakfast     Antimicrobials: Anti-infectives (From admission, onward)    Start     Dose/Rate Route Frequency Ordered Stop   10/14/20 1000  cefTRIAXone (ROCEPHIN) 1 g in sodium chloride 0.9 % 100 mL IVPB        1 g 200 mL/hr over 30 Minutes Intravenous Daily 10/14/20 0833         PRN meds: acetaminophen **OR** acetaminophen, albuterol, ondansetron **OR** ondansetron (ZOFRAN) IV   Objective: Vitals:   10/14/20 0735 10/14/20 1010  BP:  139/61  Pulse:  70  Resp:  16  Temp:  98.4 F (36.9 C)  SpO2: 97% 100%    Intake/Output Summary (Last 24 hours) at 10/14/2020 1119 Last data filed at 10/14/2020 0850 Gross per 24 hour  Intake 620.67 ml  Output 700 ml  Net -79.33 ml   There were no vitals filed for this visit. Weight change:  There is no height or weight on file to calculate BMI.   Physical Exam: General exam: Pleasant, elderly Caucasian female.  Lying on bed.  Feels weak.  Not in distress Skin: No rashes, lesions or ulcers. HEENT: Atraumatic, normocephalic, no obvious bleeding Lungs: Clear to auscultation bilaterally CVS: Regular rate and rhythm, no murmur GI/Abd soft, nontender, nondistended, bowel sound present CNS: Alert, awake, oriented x3 Psychiatry: Worried Extremities: No pedal edema, no calf tenderness  Data Review: I have personally reviewed the laboratory data and studies available.  Recent Labs  Lab 10/13/20 1554 10/13/20 2325  WBC 6.7 7.0  NEUTROABS 4.1 3.6  HGB 7.6* 7.1*  HCT 23.5* 22.1*  MCV 97.9 98.7  PLT 320 311   Recent Labs  Lab 10/13/20 1554 10/14/20 0810  NA 136 135  K 4.2 4.1  CL 102 106  CO2 26 24  GLUCOSE 98 92  BUN 20 21  CREATININE 0.65 0.50  CALCIUM 9.4 8.6*    F/u labs ordered Unresulted Labs (From admission, onward)     Start     Ordered   10/20/20 0500  Creatinine, serum  (enoxaparin (LOVENOX)    CrCl >/= 30 ml/min)  Weekly,   R     Comments: while on enoxaparin therapy    10/13/20 2240  10/14/20 2637  Urine Culture   Once,   R       Question:  Indication  Answer:  Dysuria   10/14/20 8588   10/13/20 2353  Pathologist smear review  Add-on,   AD       Question:  Specimen collection method  Answer:  Lab=Lab collect   10/13/20 2352   10/13/20 2308  Haptoglobin  Once,   R        10/13/20 2307            Signed, Terrilee Croak, MD [facility] 10/14/2020

## 2020-10-15 ENCOUNTER — Inpatient Hospital Stay (HOSPITAL_COMMUNITY): Payer: Medicare Other

## 2020-10-15 DIAGNOSIS — R0609 Other forms of dyspnea: Secondary | ICD-10-CM

## 2020-10-15 LAB — CBC WITH DIFFERENTIAL/PLATELET
Abs Immature Granulocytes: 0.04 10*3/uL (ref 0.00–0.07)
Basophils Absolute: 0.1 10*3/uL (ref 0.0–0.1)
Basophils Relative: 1 %
Eosinophils Absolute: 0 10*3/uL (ref 0.0–0.5)
Eosinophils Relative: 0 %
HCT: 27.7 % — ABNORMAL LOW (ref 36.0–46.0)
Hemoglobin: 9.3 g/dL — ABNORMAL LOW (ref 12.0–15.0)
Immature Granulocytes: 1 %
Lymphocytes Relative: 22 %
Lymphs Abs: 1.5 10*3/uL (ref 0.7–4.0)
MCH: 31.7 pg (ref 26.0–34.0)
MCHC: 33.6 g/dL (ref 30.0–36.0)
MCV: 94.5 fL (ref 80.0–100.0)
Monocytes Absolute: 0.9 10*3/uL (ref 0.1–1.0)
Monocytes Relative: 13 %
Neutro Abs: 4.3 10*3/uL (ref 1.7–7.7)
Neutrophils Relative %: 63 %
Platelets: 308 10*3/uL (ref 150–400)
RBC: 2.93 MIL/uL — ABNORMAL LOW (ref 3.87–5.11)
RDW: 16.9 % — ABNORMAL HIGH (ref 11.5–15.5)
WBC: 6.9 10*3/uL (ref 4.0–10.5)
nRBC: 0 % (ref 0.0–0.2)

## 2020-10-15 LAB — BPAM RBC
Blood Product Expiration Date: 202210042359
ISSUE DATE / TIME: 202209070219
Unit Type and Rh: 9500

## 2020-10-15 LAB — ECHOCARDIOGRAM COMPLETE
Area-P 1/2: 3.42 cm2
P 1/2 time: 520 msec
S' Lateral: 3 cm

## 2020-10-15 LAB — TYPE AND SCREEN
ABO/RH(D): O NEG
Antibody Screen: NEGATIVE
Unit division: 0

## 2020-10-15 LAB — BASIC METABOLIC PANEL
Anion gap: 10 (ref 5–15)
BUN: 14 mg/dL (ref 8–23)
CO2: 24 mmol/L (ref 22–32)
Calcium: 9.1 mg/dL (ref 8.9–10.3)
Chloride: 102 mmol/L (ref 98–111)
Creatinine, Ser: 0.48 mg/dL (ref 0.44–1.00)
GFR, Estimated: >60 mL/min (ref >60)
Glucose, Bld: 115 mg/dL — ABNORMAL HIGH (ref 70–99)
Potassium: 3.9 mmol/L (ref 3.5–5.1)
Sodium: 136 mmol/L (ref 135–145)

## 2020-10-15 LAB — HAPTOGLOBIN: Haptoglobin: 138 mg/dL (ref 41–333)

## 2020-10-15 NOTE — Evaluation (Signed)
Physical Therapy Evaluation Patient Details Name: [name] MRN: [id number] DOB: [birth year] Today's Date: 10/15/2020   History of Present Illness  Pt admitted 2* weakness and overwhelming fatigue - dx with acute on chronic anemia with  hgb 7.1.  Pt with hx of COPD, DDD, SBO, and chronic ulcerative rectosigmoiditis.  Clinical Impression  Pt admitted as above and presenting with functional mobility limitations 2* generalized weakness, decreased endurance and ambulatory balance deficits following several days in bed.  Pt c/o "dizziness and just feeling weird" with ambulation - BP 149/68.  Pt states one of the inhalers makes her feel that way.  Pt motivated to progress to dc back home but currently at increased risk of falling and does not have 24/7 assist.     Follow Up Recommendations Home health PT (dependent on acute stay progress)    Equipment Recommendations  None recommended by PT    Recommendations for Other Services OT consult     Precautions / Restrictions Precautions Precautions: Fall Restrictions Weight Bearing Restrictions: No      Mobility  Bed Mobility Overal bed mobility: Modified Independent             General bed mobility comments: no physical assist to move to bedside sitting    Transfers Overall transfer level: Needs assistance Equipment used: 1 person hand held assist Transfers: Sit to/from Stand;Stand Pivot Transfers Sit to Stand: Min guard Stand pivot transfers: Min assist       General transfer comment: Steady assist to stand and min assist to pvt to sit in recliner.  Ambulation/Gait Ambulation/Gait assistance: Min assist;Min guard Gait Distance (Feet): 100 Feet Assistive device: Rolling walker (2 wheeled);Quad cane Gait Pattern/deviations: Step-to pattern;Step-through pattern;Decreased step length - right;Decreased step length - left;Shuffle;Trunk flexed Gait velocity: decr   General Gait Details: Pt unsteady with initial steps using QC.   Advanced to RW with marked improvement in stability.  Pt limited by c/o fatigue and dizziness - BP 149/68  Stairs            Wheelchair Mobility    Modified Rankin (Stroke Patients Only)       Balance Overall balance assessment: Needs assistance Sitting-balance support: No upper extremity supported;Feet supported Sitting balance-Leahy Scale: Good     Standing balance support: No upper extremity supported Standing balance-Leahy Scale: Fair                               Pertinent Vitals/Pain      Home Living Family/patient expects to be discharged to:: Private residence Living Arrangements: Alone Available Help at Discharge: Family;Available PRN/intermittently Type of Home: Apartment Home Access: Level entry     Home Layout: One level Home Equipment: Grab bars - tub/shower;Cane - single point;Walker - 2 wheels;Shower seat Additional Comments: Living in senior community apt. Has life alert button    Prior Function Level of Independence: Independent with assistive device(s)         Comments: Uses cane for ambulation outside the home, able to complete BADL/IADL     Hand Dominance        Extremity/Trunk Assessment   Upper Extremity Assessment Upper Extremity Assessment: Generalized weakness    Lower Extremity Assessment Lower Extremity Assessment: Generalized weakness       Communication   Communication: No difficulties  Cognition Arousal/Alertness: Awake/alert Behavior During Therapy: WFL for tasks assessed/performed Overall Cognitive Status: Within Functional Limits for tasks assessed  General Comments      Exercises General Exercises - Lower Extremity Ankle Circles/Pumps: AROM;Both;15 reps;Supine Long Arc Quad: AROM;Both;10 reps;Seated   Assessment/Plan    PT Assessment Patient needs continued PT services  PT Problem List Decreased strength;Decreased activity  tolerance;Decreased balance;Decreased mobility;Decreased knowledge of use of DME       PT Treatment Interventions DME instruction;Gait training;Functional mobility training;Therapeutic activities;Therapeutic exercise;Balance training;Patient/family education    PT Goals (Current goals can be found in the Care Plan section)  Acute Rehab PT Goals Patient Stated Goal: Regain IND PT Goal Formulation: With patient Time For Goal Achievement: 10/29/20 Potential to Achieve Goals: Good    Frequency Min 3X/week   Barriers to discharge Decreased caregiver support Lives alone with prn support of son and life alert    Co-evaluation               AM-PAC PT "6 Clicks" Mobility  Outcome Measure Help needed turning from your back to your side while in a flat bed without using bedrails?: None Help needed moving from lying on your back to sitting on the side of a flat bed without using bedrails?: None Help needed moving to and from a bed to a chair (including a wheelchair)?: A Little Help needed standing up from a chair using your arms (e.g., wheelchair or bedside chair)?: A Little Help needed to walk in hospital room?: A Little Help needed climbing 3-5 steps with a railing? : A Little 6 Click Score: 20    End of Session Equipment Utilized During Treatment: Gait belt Activity Tolerance: Patient tolerated treatment well;Patient limited by fatigue Patient left: in chair;with call bell/phone within reach;with chair alarm set Nurse Communication: Mobility status PT Visit Diagnosis: Difficulty in walking, not elsewhere classified (R26.2);Muscle weakness (generalized) (M62.81)    Time: 6578-4696 PT Time Calculation (min) (ACUTE ONLY): 41 min   Charges:   PT Evaluation $PT Eval Low Complexity: 1 Low PT Treatments $Gait Training: 8-22 mins        Mauro Kaufmann PT Acute Rehabilitation Services Pager [phone] Office [phone]   Montrail Mehrer 10/15/2020, 12:44 PM

## 2020-10-15 NOTE — Progress Notes (Signed)
  Echocardiogram 2D Echocardiogram has been performed.  [name] 10/15/2020, 2:22 PM

## 2020-10-15 NOTE — Progress Notes (Addendum)
PROGRESS NOTE  [name]  DOB: [birth year]  PCP: Caren Macadam, MD ALP:[id number]  DOA: 10/13/2020  LOS: 2 days  Hospital Day: 3   Chief Complaint  Patient presents with   Weakness    Brief narrative: [name] is a 86 y.o. female with PMH significant for chronic ulcerative rectosigmoiditis, hypothyroidism, and COPD. Patient presented to the ED on 9/6 with complaint of profound fatigue and generalized weakness that developed over couple of weeks with significant worsening in the past 1 to 2 days.  In the ED, patient was afebrile and hemodynamically stable.  Orthostatic vital signs were negative. Labs showed hemoglobin low at 7.6, down from 11.6 in July 2022. FOBT negative. Urinalysis with clear yellow urine, small leukocytes and positive nitrite Patient was admitted to hospital service for further evaluation management  Subjective: Patient was seen and examined this morning.  Sitting up in chair.  Walked with physical therapy earlier.  Felt very weak.  Hemodynamically stable.  No fever.  Labs this morning with hemoglobin significantly better at 9.3.  Assessment/Plan: Acute on chronic anemia -It seems that patient has chronic anemia with hemoglobin between 8 and 9 for last several months.  But it was high at 11.6 in July 2022.  Presented with low hemoglobin at 7.6.  1 unit of PRBC was transfused.  Repeat hemoglobin today shows significant improvement to 9.3.   -Ferritin level 23, reticulocyte count up at 4.9, LDH normal, folate and vitamin B12 level normal -Suspect chronic slow blood loss, likely cause GI bleeding due to underlying ulcerative colitis. -Because of gradually downtrending ferritin level over the period of months, patient may benefit from iron supplement at home. Recent Labs    11/17/19 0506 11/18/19 0545 03/14/20 0416 03/15/20 0355 03/16/20 0303 03/19/20 0350 04/28/20 0941 08/14/20 1618 10/13/20 1554 10/13/20 2325 10/13/20 2335 10/15/20 0858  HGB  10.1*   < > 10.1* 8.9* 8.6*   < > 7.1* 11.6* 7.6* 7.1*  --  9.3*  MCV 92.7   < > 93.5 95.2 93.4   < > 89.5 93.2 97.9 98.7  --  94.5  VITAMINB12 431  --   --   --   --   --   --  1,945*  --   --  477  --   FOLATE 43.4  --   --   --   --   --   --   --   --   --  16.2  --   FERRITIN 51   < > 27 29 33  --   --  41  --   --  23  --   TIBC 310  --   --   --   --   --   --  274  --   --  286  --   IRON 50  --   --   --   --   --   --  109  --   --  150  --   RETICCTPCT 1.3  --   --   --   --   --   --   --   --   --  4.9*  --    < > = values in this interval not displayed.    Chronic ulcerative rectosigmoiditis -Continue mesalamine daily.  No obvious GI bleeding.  FOBT negative.   -Follows up with GI at [facility].   Abnormal urinalysis -presented with profound weakness, but denies  any local urinary symptoms.  Urinalysis without any WBC count.  Had positive nitrite only -Discussed with ID.  We will monitor off antibiotics at this time.  COPD  -Respiratory status stable.  Continue inhalers.   Hypothyroidism  -TSH normal, continue current dose of Synthroid     Chronic mild systolic CHF -Previous echocardiogram from 2014 showed an EF of 50 to 55% with grade 1 diastolic dysfunction.  No clinical evidence of CHF exacerbation at this time.  Not on any heart failure medication.  Obtain echocardiogram to see if it explains any cardiac etiology of her weakness.  Generalized weakness -Her primary concern is profound weakness.  Unclear cause.  Anemia not severe enough to cause that.  No convincing evidence of UTI.  Obtain echocardiogram to rule out worsening of heart failure   Mobility: Encourage ambulation.  Appreciated Code Status:   Code Status: DNR  Nutritional status: There is no height or weight on file to calculate BMI.     Diet:  Diet Order             Diet Heart Room service appropriate? Yes; Fluid consistency: Thin  Diet effective now                  DVT prophylaxis:   enoxaparin (LOVENOX) injection 40 mg Start: 10/14/20 1000   Antimicrobials: IV Rocephin Fluid: None Consultants: None Family Communication: None at bedside  Status is: Inpatient  Remains inpatient appropriate because: Still feels weak and wants to wait till tomorrow.  Lives alone.  Home health PT ordered Dispo: The patient is from: Home              Anticipated d/c is to: Possible discharge tomorrow              Patient currently is not medically stable to d/c.   Difficult to place patient No     Infusions:     Scheduled Meds:  budesonide  0.25 mg Nebulization BID   enoxaparin (LOVENOX) injection  40 mg Subcutaneous Q24H   levothyroxine  50 mcg Oral Q0600   mesalamine  2.4 g Oral Q breakfast    Antimicrobials: Anti-infectives (From admission, onward)    Start     Dose/Rate Route Frequency Ordered Stop   10/14/20 1000  cefTRIAXone (ROCEPHIN) 1 g in sodium chloride 0.9 % 100 mL IVPB  Status:  Discontinued        1 g 200 mL/hr over 30 Minutes Intravenous Daily 10/14/20 0833 10/14/20 1433       PRN meds: acetaminophen **OR** acetaminophen, albuterol, ondansetron **OR** ondansetron (ZOFRAN) IV   Objective: Vitals:   10/15/20 0600 10/15/20 0837  BP: (!) 141/76   Pulse: 65   Resp: 14   Temp: 98 F (36.7 C)   SpO2: 96% 97%    Intake/Output Summary (Last 24 hours) at 10/15/2020 1445 Last data filed at 10/15/2020 0604 Gross per 24 hour  Intake 340 ml  Output 1475 ml  Net -1135 ml    There were no vitals filed for this visit. Weight change:  There is no height or weight on file to calculate BMI.   Physical Exam: General exam: Pleasant, elderly Caucasian female.  Lying on bed.  Feels weak.  Not in physical distress Skin: No rashes, lesions or ulcers. HEENT: Atraumatic, normocephalic, no obvious bleeding Lungs: Clear to auscultation bilaterally CVS: Regular rate and rhythm, no murmur GI/Abd soft, nontender, nondistended, bowel sound present CNS: Alert, awake,  oriented x3 Psychiatry: Mood appropriate  Extremities: No pedal edema, no calf tenderness  Data Review: I have personally reviewed the laboratory data and studies available.  Recent Labs  Lab 10/13/20 1554 10/13/20 2325 10/15/20 0858  WBC 6.7 7.0 6.9  NEUTROABS 4.1 3.6 4.3  HGB 7.6* 7.1* 9.3*  HCT 23.5* 22.1* 27.7*  MCV 97.9 98.7 94.5  PLT 320 311 308    Recent Labs  Lab 10/13/20 1554 10/14/20 0810 10/15/20 0858  NA 136 135 136  K 4.2 4.1 3.9  CL 102 106 102  CO2 26 24 24   GLUCOSE 98 92 115*  BUN 20 21 14   CREATININE 0.65 0.50 0.48  CALCIUM 9.4 8.6* 9.1     F/u labs ordered Unresulted Labs (From admission, onward)     Start     Ordered   10/20/20 0500  Creatinine, serum  (enoxaparin (LOVENOX)    CrCl >/= 30 ml/min)  Weekly,   R     Comments: while on enoxaparin therapy    10/13/20 2240   10/14/20 0833  Urine Culture  Once,   R       Question:  Indication  Answer:  Dysuria   10/14/20 8003            Signed, Terrilee Croak, MD [facility] 10/15/2020

## 2020-10-16 NOTE — TOC Initial Note (Signed)
Transition of Care [facility]) - Initial/Assessment Note    Patient Details  Name: [name] MRN: [id number] Date of Birth: 10-25-1934  [facility]) CM/SW Contact:    Lynnell Catalan, RN Phone Number: 10/16/2020, 10:08 AM  Clinical Narrative:                 Spoke with pt for dc planning. Pt declines [facility] services at this time. She states she lives in a senior apartment and her son lives around the corner. She states she has a RW, BSC and a life alert button that she can use if needed.   Expected Discharge Plan: Home/Self Care Barriers to Discharge: Continued Medical Work up   Patient Goals and CMS Choice Patient states their goals for this hospitalization and ongoing recovery are:: To get back home.      Expected Discharge Plan and Services Expected Discharge Plan: Home/Self Care   Discharge Planning Services: CM Consult   Living arrangements for the past 2 months: [place]                    Prior Living Arrangements/Services Living arrangements for the past 2 months: [place] Lives with:: Self Patient language and need for interpreter reviewed:: Yes Do you feel safe going back to the place where you live?: Yes      Need for Family Participation in Patient Care: Yes (Comment) Care giver support system in place?: Yes (comment)   Criminal Activity/Legal Involvement Pertinent to Current Situation/Hospitalization: No - Comment as needed  Activities of Daily Living Home Assistive Devices/Equipment: None ADL Screening (condition at time of admission) Patient's cognitive ability adequate to safely complete daily activities?: Yes Is the patient deaf or have difficulty hearing?: No Does the patient have difficulty seeing, even when wearing glasses/contacts?: No Does the patient have difficulty concentrating, remembering, or making decisions?: No Patient able to express need for assistance with ADLs?: Yes Does the patient have  difficulty dressing or bathing?: No Independently performs ADLs?: Yes (appropriate for developmental age) Does the patient have difficulty walking or climbing stairs?: No Weakness of Legs: Both Weakness of Arms/Hands: Both  Permission Sought/Granted                  Emotional Assessment   Attitude/Demeanor/Rapport: Gracious Affect (typically observed): Calm Orientation: : Oriented to Self, Oriented to Place, Oriented to  Time, Oriented to Situation   Psych Involvement: No (comment)  Admission diagnosis:  Weakness [R53.1] Symptomatic anemia [D64.9] Patient Active Problem List   Diagnosis Date Noted   Symptomatic anemia 10/13/2020   GIB (gastrointestinal bleeding) 03/11/2020   Chronic ulcerative rectosigmoiditis with rectal bleeding (HCC)    Abnormal CT scan, gastrointestinal tract    LLQ abdominal pain 10/31/2019   Diverticulitis 10/31/2019   UTI (urinary tract infection) 08/06/2019   COPD (chronic obstructive pulmonary disease) ([place])    Ulcerative colitis ([place])    Vitamin D deficiency 05/01/2019   Arthralgia 08/21/2017   Osteoporosis 11/21/2016   Postmenopausal status 09/06/2016   Other nonspecific abnormal finding of lung field 08/14/2015   Hyponatremia 07/21/2014   Menopausal state 07/18/2014   Hematest positive stools 08/05/2013   Anemia, blood loss 08/05/2013   Cerumen impaction 06/03/2013   History of adrenal disorder 10/15/2012   Rash and nonspecific skin eruption 10/15/2012   Iron deficiency anemia 06/20/2012   Psychogenic polydipsia 06/20/2012   Nonspecific abnormal electrocardiogram (ECG) (EKG) 06/20/2012   Polyuria 05/02/2012   Pelvic pain in  female 08/16/2011   Leg ulcer ([place]) 06/16/2010   Routine general medical examination at a health care facility 06/11/2010   Encounter for long-term (current) use of other medications 06/11/2010   HYPERCHOLESTEROLEMIA 06/05/2009   UNSPECIFIED URINARY CALCULUS 05/28/2008   CHEST PAIN, ATYPICAL 05/28/2008    ABDOMINAL PAIN, CHRONIC 05/28/2008   ASYMPTOMATIC POSTMENOPAUSAL STATUS 05/28/2008   SOMATIZATION DISORDER 04/25/2007   Hypothyroidism 09/21/2006   Essential hypertension 09/21/2006   ALLERGIC RHINITIS 09/21/2006   ASTHMA 09/21/2006   GERD 09/21/2006   Disorder resulting from impaired renal function 09/21/2006   DIVERTICULITIS, HX OF 09/21/2006   PCP:  Caren Macadam, MD Pharmacy:   [id number] [id number]: [phone]: [phone]    Social Determinants of Health (SDOH) Interventions    Readmission Risk Interventions Readmission Risk Prevention Plan 10/16/2020  Transportation Screening Complete  PCP or Specialist Appt within 5-7 Days Complete  Home Care Screening Complete  Medication Review (RN CM) Complete  Some recent data might be hidden

## 2020-10-16 NOTE — Discharge Summary (Signed)
Physician Discharge Summary  [name] WGN:[id number] DOB: [birth year] DOA: 10/13/2020  PCP: Margaret Banker, MD  Admit date: 10/13/2020 Discharge date: 10/16/2020  Admitted From: Home Discharge disposition: Home with home health PT   Code Status: DNR   Discharge Diagnosis:   Principal Problem:   Symptomatic anemia Active Problems:   Hypothyroidism   COPD (chronic obstructive pulmonary disease) (HCC)   Ulcerative colitis [facility])    Chief Complaint  Patient presents with   Weakness    Brief narrative: [name] is a 86 y.o. female with PMH significant for chronic ulcerative rectosigmoiditis, hypothyroidism, and COPD. Patient presented to the ED on 9/6 with complaint of profound fatigue and generalized weakness that developed over couple of weeks with significant worsening in the past 1 to 2 days.  In the ED, patient was afebrile and hemodynamically stable.  Orthostatic vital signs were negative. Labs showed hemoglobin low at 7.6, down from 11.6 in July 2022. FOBT negative. Urinalysis with clear yellow urine, small leukocytes and positive nitrite Patient was admitted to hospital service for further evaluation management  Subjective: Patient was seen and examined this morning.  Sitting up in chair.  Not in distress.  Woke up around 3 AM this morning and has been up multiple times.  Feels ready to go home today.  Hospital course Acute on chronic anemia -It seems that patient has chronic anemia with hemoglobin between 8 and 9 for last several months.  But it was high at 11.6 in July 2022.  This admission, she presented with low hemoglobin at 7.6.  1 unit of PRBC was transfused.  Repeat hemoglobin showed significant improvement to 9.3.   -Ferritin level 23, reticulocyte count up at 4.9, LDH normal, folate and vitamin B12 level normal -Suspect chronic slow blood loss, likely cause GI bleeding due to underlying ulcerative colitis. -Because of gradually downtrending ferritin  level over the period of months.  Continue iron supplement at home. Recent Labs    11/17/19 0506 11/18/19 0545 03/14/20 0416 03/15/20 0355 03/16/20 0303 03/19/20 0350 04/28/20 0941 08/14/20 1618 10/13/20 1554 10/13/20 2325 10/13/20 2335 10/15/20 0858  HGB 10.1*   < > 10.1* 8.9* 8.6*   < > 7.1* 11.6* 7.6* 7.1*  --  9.3*  MCV 92.7   < > 93.5 95.2 93.4   < > 89.5 93.2 97.9 98.7  --  94.5  VITAMINB12 431  --   --   --   --   --   --  1,945*  --   --  477  --   FOLATE 43.4  --   --   --   --   --   --   --   --   --  16.2  --   FERRITIN 51   < > 27 29 33  --   --  41  --   --  23  --   TIBC 310  --   --   --   --   --   --  274  --   --  286  --   IRON 50  --   --   --   --   --   --  109  --   --  150  --   RETICCTPCT 1.3  --   --   --   --   --   --   --   --   --  4.9*  --    < > =  values in this interval not displayed.   Chronic ulcerative rectosigmoiditis -Continue mesalamine daily.  No obvious GI bleeding.  FOBT negative.   -Follows up with GI at [facility].   Abnormal urinalysis -presented with profound weakness, but denies any local urinary symptoms.  Urinalysis without any WBC count.  Had positive nitrite only -Discussed with ID.  No need of antibiotics at this time.  COPD  -Respiratory status stable.  Continue inhalers.   Hypothyroidism  -TSH normal, continue current dose of Synthroid     Chronic mild systolic CHF -Echocardiogram 9/8 showed similar finding compared to echo from 2014 which showed an EF of 50 to 55%.  No clinical evidence of CHF exacerbation at this time.  Not on any heart failure medication.   Generalized weakness -Her primary concern is profound weakness.  Unclear cause.  Anemia not severe enough to cause that.  No convincing evidence of UTI.   -Weakness improved.  Mobility assist.  Home health PT offered.    Allergies as of 10/16/2020       Reactions   [place] Nut (berthollefia [place]) Skin Test Anaphylaxis   Fluconazole Shortness Of Breath    Metronidazole Shortness Of Breath, Nausea And Vomiting   Pepcid [famotidine] Shortness Of Breath, Other (See Comments)   Dizziness   Shellfish Allergy Anaphylaxis   Macrobid [nitrofurantoin]    REACTION: Syncope   Pantoprazole Other (See Comments)   CHEST PAIN        Medication List     TAKE these medications    acetaminophen 325 MG tablet Commonly known as: TYLENOL Take 650 mg by mouth every 4 (four) hours as needed for mild pain or headache.   E-Z Spacer inhaler Use as instructed   fluticasone 110 MCG/ACT inhaler Commonly known as: Flovent HFA Take 2 puffs by mouth twice daily   Iron 325 (65 Fe) MG Tabs Take 1 tablet by mouth daily.   levothyroxine 50 MCG tablet Commonly known as: SYNTHROID Take 1 tablet (50 mcg total) by mouth daily.   mesalamine 1.2 g EC tablet Commonly known as: LIALDA TAKE 2 TABLETS BY MOUTH DAILY WITH BREAKFAST.   VITAMIN D3 PO Take 1 tablet by mouth 2 (two) times a week. Saturday and Sunday        Discharge Instructions:  Diet Recommendation:  Discharge Diet Orders (From admission, onward)     Start     Ordered   10/16/20 0000  Diet general        09 /09/22 1037              Follow with Primary MD Margaret Banker, MD in 7 days   Get CBC/BMP checked in next visit within 1 week by PCP or SNF MD ( we routinely change or add medications that can affect your baseline labs and fluid status, therefore we recommend that you get the mentioned basic workup next visit with your PCP, your PCP may decide not to get them or add new tests based on their clinical decision)  On your next visit with your PCP, please Get Medicines reviewed and adjusted.  Please request your PCP  to go over all Hospital Tests and Procedure/Radiological results at the follow up, please get all Hospital records sent to your Prim MD by signing hospital release before you go home.  Activity: As tolerated with Full fall precautions use walker/cane & assistance  as needed  For Heart failure patients - Check your Weight same time everyday, if you gain over 2 pounds,  or you develop in leg swelling, experience more shortness of breath or chest pain, call your Primary MD immediately. Follow Cardiac Low Salt Diet and 1.5 lit/day fluid restriction.  If you have smoked or chewed Tobacco in the last 2 yrs please stop smoking, stop any regular Alcohol  and or any Recreational drug use.  If you experience worsening of your admission symptoms, develop shortness of breath, life threatening emergency, suicidal or homicidal thoughts you must seek medical attention immediately by calling 911 or calling your MD immediately  if symptoms less severe.  You Must read complete instructions/literature along with all the possible adverse reactions/side effects for all the Medicines you take and that have been prescribed to you. Take any new Medicines after you have completely understood and accpet all the possible adverse reactions/side effects.   Do not drive, operate heavy machinery, perform activities at heights, swimming or participation in water activities or provide baby sitting services if your were admitted for syncope or siezures until you have seen by Primary MD or a Neurologist and advised to do so again.  Do not drive when taking Pain medications.  Do not take more than prescribed Pain, Sleep and Anxiety Medications  Wear Seat belts while driving.   Please note You were cared for by a hospitalist during your hospital stay. If you have any questions about your discharge medications or the care you received while you were in the hospital after you are discharged, you can call the unit and asked to speak with the hospitalist on call if the hospitalist that took care of you is not available. Once you are discharged, your primary care physician will handle any further medical issues. Please note that NO REFILLS for any discharge medications will be authorized once you are  discharged, as it is imperative that you return to your primary care physician (or establish a relationship with a primary care physician if you do not have one) for your aftercare needs so that they can reassess your need for medications and monitor your lab values.    Follow ups:    Follow-up Information     Margaret Banker, MD Follow up.   Specialty: Family Medicine Contact information: [address] [place] [address] [phone]                 Wound care:     Discharge Exam:   Vitals:   10/15/20 1530 10/15/20 1917 10/15/20 2122 10/16/20 0450  BP: 133/77  138/64 122/73  Pulse: 68  71 67  Resp: 16  14 14   Temp: 98 F (36.7 C)  98.8 F (37.1 C) 98.3 F (36.8 C)  TempSrc: Oral  Oral Oral  SpO2: 98% 98% 97% 98%    There is no height or weight on file to calculate BMI.  General exam: Pleasant, elderly Caucasian female.  Sitting up in chair.  Not in distress Skin: No rashes, lesions or ulcers. HEENT: Atraumatic, normocephalic, no obvious bleeding Lungs: Clear to auscultation bilaterally CVS: Regular rate and rhythm, no murmur GI/Abd soft, nontender, nondistended, bowel sound present CNS: Alert, awake, oriented x3 Psychiatry: Mood appropriate Extremities: No pedal edema, no calf tenderness  Time coordinating discharge: 35 minutes   The results of significant diagnostics from this hospitalization (including imaging, microbiology, ancillary and laboratory) are listed below for reference.    Procedures and Diagnostic Studies:   DG Chest 2 View  Result Date: 10/13/2020 CLINICAL DATA:  Weakness EXAM: CHEST - 2 VIEW COMPARISON:  03/11/2020 FINDINGS: Stable cardiomediastinal contours. Large hiatal hernia. No pleural effusion or edema. No airspace densities. Scarring noted within the left upper lobe, similar to previous exam. IMPRESSION: 1. No acute cardiopulmonary abnormalities. 2. Large hiatal hernia. Electronically Signed   By: Signa Kell  M.D.   On: 10/13/2020 16:08     Labs:   Basic Metabolic Panel: Recent Labs  Lab 10/13/20 1554 10/14/20 0810 10/15/20 0858  NA 136 135 136  K 4.2 4.1 3.9  CL 102 106 102  CO2 26 24 24   GLUCOSE 98 92 115*  BUN 20 21 14   CREATININE 0.65 0.50 0.48  CALCIUM 9.4 8.6* 9.1   GFR CrCl cannot be calculated (Unknown ideal weight.). Liver Function Tests: Recent Labs  Lab 10/13/20 1554  AST 17  ALT 9  ALKPHOS 39  BILITOT 0.2*  PROT 6.2*  ALBUMIN 3.8   No results for input(s): LIPASE, AMYLASE in the last 168 hours. No results for input(s): AMMONIA in the last 168 hours. Coagulation profile No results for input(s): INR, PROTIME in the last 168 hours.  CBC: Recent Labs  Lab 10/13/20 1554 10/13/20 2325 10/15/20 0858  WBC 6.7 7.0 6.9  NEUTROABS 4.1 3.6 4.3  HGB 7.6* 7.1* 9.3*  HCT 23.5* 22.1* 27.7*  MCV 97.9 98.7 94.5  PLT 320 311 308   Cardiac Enzymes: No results for input(s): CKTOTAL, CKMB, CKMBINDEX, TROPONINI in the last 168 hours. BNP: Invalid input(s): POCBNP CBG: Recent Labs  Lab 10/14/20 1215  GLUCAP 98   D-Dimer No results for input(s): DDIMER in the last 72 hours. Hgb A1c No results for input(s): HGBA1C in the last 72 hours. Lipid Profile No results for input(s): CHOL, HDL, LDLCALC, TRIG, CHOLHDL, LDLDIRECT in the last 72 hours. Thyroid function studies Recent Labs    10/13/20 1554  TSH 3.936   Anemia work up Recent Labs    10/13/20 2335  VITAMINB12 477  FOLATE 16.2  FERRITIN 23  TIBC 286  IRON 150  RETICCTPCT 4.9*   Microbiology Recent Results (from the past 240 hour(s))  Resp Panel by RT-PCR (Flu A&B, Covid) Nasopharyngeal Swab     Status: None   Collection Time: 10/13/20  5:44 PM   Specimen: Nasopharyngeal Swab; Nasopharyngeal(NP) swabs in vial transport medium  Result Value Ref Range Status   SARS Coronavirus 2 by RT PCR NEGATIVE NEGATIVE Final    Comment: (NOTE) SARS-CoV-2 target nucleic acids are NOT DETECTED.  The  SARS-CoV-2 RNA is generally detectable in upper respiratory specimens during the acute phase of infection. The lowest concentration of SARS-CoV-2 viral copies this assay can detect is 138 copies/mL. A negative result does not preclude SARS-Cov-2 infection and should not be used as the sole basis for treatment or other patient management decisions. A negative result may occur with  improper specimen collection/handling, submission of specimen other than nasopharyngeal swab, presence of viral mutation(s) within the areas targeted by this assay, and inadequate number of viral copies(<138 copies/mL). A negative result must be combined with clinical observations, patient history, and epidemiological information. The expected result is Negative.  Fact Sheet for Patients:  BloggerCourse.com  Fact Sheet for Healthcare Providers:  SeriousBroker.it  This test is no t yet approved or cleared by the [place] FDA and  has been authorized for detection and/or diagnosis of SARS-CoV-2 by FDA under an Emergency Use Authorization (EUA). This EUA will remain  in effect (meaning this test can be used) for the duration of the COVID-19 declaration under Section 564(b)(1) of  the Act, 21 U.S.C.section 360bbb-3(b)(1), unless the authorization is terminated  or revoked sooner.       Influenza A by PCR NEGATIVE NEGATIVE Final   Influenza B by PCR NEGATIVE NEGATIVE Final    Comment: (NOTE) The Xpert Xpress SARS-CoV-2/FLU/RSV plus assay is intended as an aid in the diagnosis of influenza from Nasopharyngeal swab specimens and should not be used as a sole basis for treatment. Nasal washings and aspirates are unacceptable for Xpert Xpress SARS-CoV-2/FLU/RSV testing.  Fact Sheet for Patients: BloggerCourse.com  Fact Sheet for Healthcare Providers: SeriousBroker.it  This test is not yet approved or  cleared by the [place] FDA and has been authorized for detection and/or diagnosis of SARS-CoV-2 by FDA under an Emergency Use Authorization (EUA). This EUA will remain in effect (meaning this test can be used) for the duration of the COVID-19 declaration under Section 564(b)(1) of the Act, 21 U.S.C. section 360bbb-3(b)(1), unless the authorization is terminated or revoked.  Performed at [facility], [address], [place], [place] [address]   Urine Culture     Status: None (Preliminary result)   Collection Time: 10/14/20  4:55 PM   Specimen: Urine, Clean Catch  Result Value Ref Range Status   Specimen Description   Final    URINE, CLEAN CATCH Performed at [facility], [address], [place], [place] [address]    Special Requests   Final    NONE Performed at [facility], [address] [place], [place] [address]    Culture   Final    CULTURE REINCUBATED FOR BETTER GROWTH Performed at [facility], [address], [place], [place] [address]    Report Status PENDING  Incomplete     Signed: Melina Schools Ramiah Helfrich  [facility] 10/16/2020, 10:37 AM

## 2020-10-16 NOTE — Plan of Care (Signed)
  Problem: Education: Goal: Knowledge of General Education information will improve Description: Including pain rating scale, medication(s)/side effects and non-pharmacologic comfort measures Outcome: Progressing   Problem: Clinical Measurements: Goal: Will remain free from infection Outcome: Progressing Goal: Respiratory complications will improve Outcome: Progressing   Problem: Nutrition: Goal: Adequate nutrition will be maintained Outcome: Progressing   Problem: Elimination: Goal: Will not experience complications related to bowel motility Outcome: Progressing   Problem: Pain Managment: Goal: General experience of comfort will improve Outcome: Progressing   Problem: Safety: Goal: Ability to remain free from injury will improve Outcome: Progressing

## 2020-10-17 LAB — URINE CULTURE: Culture: 40000 — AB

## 2020-10-20 ENCOUNTER — Other Ambulatory Visit: Payer: Self-pay

## 2020-10-21 ENCOUNTER — Ambulatory Visit (INDEPENDENT_AMBULATORY_CARE_PROVIDER_SITE_OTHER): Payer: Medicare Other | Admitting: Family Medicine

## 2020-10-21 VITALS — BP 140/64 | HR 82 | Temp 98.1°F | Wt 147.1 lb

## 2020-10-21 DIAGNOSIS — D509 Iron deficiency anemia, unspecified: Secondary | ICD-10-CM

## 2020-10-21 DIAGNOSIS — R5383 Other fatigue: Secondary | ICD-10-CM

## 2020-10-21 LAB — CBC WITH DIFFERENTIAL/PLATELET
Basophils Absolute: 0.1 10*3/uL (ref 0.0–0.1)
Basophils Relative: 1.4 % (ref 0.0–3.0)
Eosinophils Absolute: 0 10*3/uL (ref 0.0–0.7)
Eosinophils Relative: 0.1 % (ref 0.0–5.0)
HCT: 27.7 % — ABNORMAL LOW (ref 36.0–46.0)
Hemoglobin: 9.3 g/dL — ABNORMAL LOW (ref 12.0–15.0)
Lymphocytes Relative: 20.2 % (ref 12.0–46.0)
Lymphs Abs: 1.3 10*3/uL (ref 0.7–4.0)
MCHC: 33.6 g/dL (ref 30.0–36.0)
MCV: 95.6 fl (ref 78.0–100.0)
Monocytes Absolute: 0.9 10*3/uL (ref 0.1–1.0)
Monocytes Relative: 13.6 % — ABNORMAL HIGH (ref 3.0–12.0)
Neutro Abs: 4.1 10*3/uL (ref 1.4–7.7)
Neutrophils Relative %: 64.7 % (ref 43.0–77.0)
Platelets: 360 10*3/uL (ref 150.0–400.0)
RBC: 2.9 Mil/uL — ABNORMAL LOW (ref 3.87–5.11)
RDW: 15.5 % (ref 11.5–15.5)
WBC: 6.3 10*3/uL (ref 4.0–10.5)

## 2020-10-21 NOTE — Progress Notes (Signed)
Established Patient Office Visit  Subjective:  Patient ID: [name], female    DOB: July 22, 1934  Age: 86 y.o. MRN: [id number]  CC:  Chief Complaint  Patient presents with   Hospitalization Follow-up    HPI [name] presents for hospital follow-up.  Apparently, her primary provider schedule is full and so she was worked in see me.  She has longstanding history of anemia and also history of ulcerative colitis.  She was admitted on 6 September with fatigue and generalized weakness.  She had had hemoglobin of 11.6 back in July of this year and was down to 7.6.  Fecal occult blood test was negative.  Feel that she had acute on chronic anemia.  Patient was transfused 1 unit of packed red blood cells.  Repeat hemoglobin improved 9.3.  Ferritin level 23 with elevated reticulocyte count.  Haptoglobin normal.  Folate and B12 normal.  LDH normal.  It was suspected that she is having some slow loss anemia related to her ulcerative colitis.  She takes mesalamine for that.  Is on chronic iron supplementation at home.  Other labs including chemistries were unremarkable.  Patient reported had colonoscopy out 19 years ago with complications with bowel obstruction afterwards.  She did have sigmoidoscopy a year ago with no acute findings.  Denies any recent abdominal pain.  No melena.  No hematemesis.  Her other chronic problems include history of hypothyroidism, COPD, mild systolic heart failure.  Echocardiogram on the eighth showed ejection fraction of 50 to 55%.  There is no evidence to suggest acute CHF exacerbation.  She had weakness in the past.  She is had physical therapy but did not like this benefited much.  She is not interested in pursuing further physical therapy at this time.  Really current medications are levothyroxine 50 mcg daily and mesalamine 1.2 g 2 tablets daily with breakfast.  She also takes iron as above.  No aspirin use.  She has excellent appetite and weight is stable.  Past  Medical History:  Diagnosis Date   ABDOMINAL PAIN, CHRONIC 05/28/2008   ALLERGIC RHINITIS 09/21/2006   ANEMIA-NOS 09/21/2006   ASTHMA 09/21/2006   ASYMPTOMATIC POSTMENOPAUSAL STATUS 05/28/2008   Cataract    CHEST PAIN, ATYPICAL 05/28/2008   COPD (chronic obstructive pulmonary disease) ([place])    COVID-19 virus infection 02/2020   DDD (degenerative disc disease)    DIVERTICULITIS, HX OF 09/21/2006   GERD 09/21/2006   HYPERCHOLESTEROLEMIA 06/05/2009   HYPERTENSION 09/21/2006   HYPOTHYROIDISM 09/21/2006   Peptic ulcer disease    Purpura ([place])    RENAL INSUFFICIENCY 09/21/2006   Shingles    Small bowel obstruction (HCC)    Somatization disorder 04/25/2007   UNSPECIFIED URINARY CALCULUS 05/28/2008   UTI (urinary tract infection) 07/2019    Past Surgical History:  Procedure Laterality Date   ABDOMINAL HYSTERECTOMY  1981   acute Nephritis  Post   BIOPSY  11/18/2019   Procedure: BIOPSY;  Surgeon: Ladene Artist, MD;  Location: [facility];  Service: Endoscopy;;   [place] N/A 11/18/2019   Procedure: FLEXIBLE SIGMOIDOSCOPY;  Surgeon: Ladene Artist, MD;  Location: [facility];  Service: Endoscopy;  Laterality: N/A;   NASAL SINUS SURGERY  1967   Pulmonary thrombosis     TONSILLECTOMY     TUBAL LIGATION  Family History  Problem Relation Age of Onset   Polycystic kidney disease Mother    Pancreatic cancer Mother    Other Father        Schamberg disease   Bladder Cancer Father    Hypertension Father    Marfan syndrome Son    Hemochromatosis Son    Cirrhosis Son    Allergic rhinitis Sister    Other Brother        bone issue as child; multiple fractures but seemed to age out of this   Hemochromatosis Cousin    Arthritis Sister    Colon cancer Neg Hx    Esophageal cancer Neg Hx    Stomach cancer Neg Hx     Social History   Socioeconomic History    Marital status: Divorced    Spouse name: Not on file   Number of children: 3   Years of education: Not on file   Highest education level: Not on file  Occupational History   Occupation: retired  Tobacco Use   Smoking status: Never   Smokeless tobacco: Never  Vaping Use   Vaping Use: Never used  Substance and Sexual Activity   Alcohol use: No   Drug use: No   Sexual activity: Not Currently  Other Topics Concern   Not on file  Social History Narrative   Not on file   Social Determinants of Health   Financial Resource Strain: Low Risk    Difficulty of Paying Living Expenses: Not hard at all  Food Insecurity: No Food Insecurity   Worried About Charity fundraiser in the Last Year: Never true   [place] in the Last Year: Never true  Transportation Needs: No Transportation Needs   Lack of Transportation (Medical): No   Lack of Transportation (Non-Medical): No  Physical Activity: Inactive   Days of Exercise per Week: 0 days   Minutes of Exercise per Session: 0 min  Stress: No Stress Concern Present   Feeling of Stress : Only a little  Social Connections: Moderately Isolated   Frequency of Communication with Friends and Family: More than three times a week   Frequency of Social Gatherings with Friends and Family: Three times a week   Attends Religious Services: More than 4 times per year   Active Member of Clubs or Organizations: No   Attends Archivist Meetings: Never   Marital Status: Widowed  Human resources officer Violence: Not At Risk   Fear of Current or Ex-Partner: No   Emotionally Abused: No   Physically Abused: No   Sexually Abused: No    Outpatient Medications Prior to Visit  Medication Sig Dispense Refill   acetaminophen (TYLENOL) 325 MG tablet Take 650 mg by mouth every 4 (four) hours as needed for mild pain or headache.      Cholecalciferol (VITAMIN D3 PO) Take 1 tablet by mouth 2 (two) times a week. Saturday and Sunday     Ferrous Sulfate (IRON)  325 (65 FE) MG TABS Take 1 tablet by mouth daily.      fluticasone (FLOVENT HFA) 110 MCG/ACT inhaler Take 2 puffs by mouth twice daily 12 g 11   levothyroxine (SYNTHROID) 50 MCG tablet Take 1 tablet (50 mcg total) by mouth daily. 90 tablet 1   mesalamine (LIALDA) 1.2 g EC tablet TAKE 2 TABLETS BY MOUTH DAILY WITH BREAKFAST. 180 tablet 1   Spacer/Aero-Holding Chambers (E-Z SPACER) inhaler Use as instructed 1 each 2   No facility-administered medications  prior to visit.    Allergies  Allergen Reactions   [place] Nut Daisey Must) Skin Test Anaphylaxis   Fluconazole Shortness Of Breath   Metronidazole Shortness Of Breath and Nausea And Vomiting   Pepcid [Famotidine] Shortness Of Breath and Other (See Comments)    Dizziness   Shellfish Allergy Anaphylaxis   Macrobid [Nitrofurantoin]     REACTION: Syncope   Pantoprazole Other (See Comments)    CHEST PAIN    ROS Review of Systems  Constitutional:  Positive for fatigue. Negative for appetite change, chills, fever and unexpected weight change.  Respiratory:  Negative for cough and shortness of breath.   Cardiovascular:  Negative for chest pain.  Gastrointestinal:  Negative for abdominal pain, blood in stool, diarrhea, nausea, rectal pain and vomiting.  Genitourinary:  Negative for dysuria and hematuria.  Neurological:  Positive for weakness. Negative for dizziness and syncope.  Psychiatric/Behavioral:  Negative for confusion.      Objective:    Physical Exam Vitals reviewed.  Cardiovascular:     Rate and Rhythm: Normal rate and regular rhythm.  Pulmonary:     Effort: Pulmonary effort is normal.     Breath sounds: Normal breath sounds.  Abdominal:     General: There is no distension.     Palpations: Abdomen is soft.     Tenderness: There is no abdominal tenderness. There is no guarding or rebound.  Musculoskeletal:     Right lower leg: No edema.     Left lower leg: No edema.  Skin:    Comments: Conjunctive and  nailbeds are fairly pale    BP 140/64 (BP Location: Left Arm, Patient Position: Sitting, Cuff Size: Normal)   Pulse 82   Temp 98.1 F (36.7 C) (Oral)   Wt 147 lb 1.6 oz (66.7 kg)   SpO2 99%   BMI 22.37 kg/m  Wt Readings from Last 3 Encounters:  10/21/20 147 lb 1.6 oz (66.7 kg)  08/14/20 144 lb (65.3 kg)  04/28/20 139 lb (63 kg)     Health Maintenance Due  Topic Date Due   Zoster Vaccines- Shingrix (1 of 2) Never done   PNA vac Low Risk Adult (2 of 2 - PCV13) 07/18/2015   INFLUENZA VACCINE  09/07/2020    There are no preventive care reminders to display for this patient.  Lab Results  Component Value Date   TSH 3.936 10/13/2020   Lab Results  Component Value Date   WBC 6.9 10/15/2020   HGB 9.3 (L) 10/15/2020   HCT 27.7 (L) 10/15/2020   MCV 94.5 10/15/2020   PLT 308 10/15/2020   Lab Results  Component Value Date   NA 136 10/15/2020   K 3.9 10/15/2020   CO2 24 10/15/2020   GLUCOSE 115 (H) 10/15/2020   BUN 14 10/15/2020   CREATININE 0.48 10/15/2020   BILITOT 0.2 (L) 10/13/2020   ALKPHOS 39 10/13/2020   AST 17 10/13/2020   ALT 9 10/13/2020   PROT 6.2 (L) 10/13/2020   ALBUMIN 3.8 10/13/2020   CALCIUM 9.1 10/15/2020   ANIONGAP 10 10/15/2020   GFR 93.60 07/31/2019   Lab Results  Component Value Date   CHOL 170 12/11/2019   Lab Results  Component Value Date   HDL 56 12/11/2019   Lab Results  Component Value Date   LDLCALC 87 12/11/2019   Lab Results  Component Value Date   TRIG 178 (H) 12/11/2019   Lab Results  Component Value Date   CHOLHDL 3.0 12/11/2019  Lab Results  Component Value Date   HGBA1C 5.6 06/21/2013      Assessment & Plan:   #1 anemia.  Recent acute on chronic anemia with history of iron deficiency and presumed slow blood loss anemia secondary to ulcerative colitis.  No evidence of hemolysis.  B12 normal.  Patient received 1 unit transfused red blood cells as above.  Fatigue unchanged.  -Recheck CBC to assess stability -We  have encouraged her to continue close follow-up with GI regarding her ulcerative colitis and will especially recommend prompt follow-up if her hemoglobin is still dropping again  #2 fatigue.  Likely multifactorial.  Difficult to sort out how much secondary to #1.  We have offered physical therapy but she declines.  Her other labs including thyroid functions, B12, chemistries were all stable during recent admission   No orders of the defined types were placed in this encounter.   Follow-up: No follow-ups on file.    Carolann Littler, MD

## 2020-11-02 ENCOUNTER — Emergency Department (HOSPITAL_COMMUNITY): Payer: Medicare Other

## 2020-11-02 ENCOUNTER — Encounter (HOSPITAL_COMMUNITY): Payer: Self-pay

## 2020-11-02 ENCOUNTER — Emergency Department (HOSPITAL_COMMUNITY)
Admission: EM | Admit: 2020-11-02 | Discharge: 2020-11-02 | Disposition: A | Discharge status: Home / Self Care | Payer: Medicare Other | Attending: Emergency Medicine | Admitting: Emergency Medicine

## 2020-11-02 ENCOUNTER — Other Ambulatory Visit: Payer: Self-pay

## 2020-11-02 DIAGNOSIS — E039 Hypothyroidism, unspecified: Secondary | ICD-10-CM | POA: Insufficient documentation

## 2020-11-02 DIAGNOSIS — K449 Diaphragmatic hernia without obstruction or gangrene: Secondary | ICD-10-CM | POA: Diagnosis not present

## 2020-11-02 DIAGNOSIS — Z7952 Long term (current) use of systemic steroids: Secondary | ICD-10-CM | POA: Insufficient documentation

## 2020-11-02 DIAGNOSIS — R5382 Chronic fatigue, unspecified: Secondary | ICD-10-CM

## 2020-11-02 DIAGNOSIS — J45909 Unspecified asthma, uncomplicated: Secondary | ICD-10-CM | POA: Insufficient documentation

## 2020-11-02 DIAGNOSIS — D649 Anemia, unspecified: Secondary | ICD-10-CM | POA: Insufficient documentation

## 2020-11-02 DIAGNOSIS — Z79899 Other long term (current) drug therapy: Secondary | ICD-10-CM | POA: Insufficient documentation

## 2020-11-02 DIAGNOSIS — R531 Weakness: Secondary | ICD-10-CM | POA: Diagnosis not present

## 2020-11-02 DIAGNOSIS — R5383 Other fatigue: Secondary | ICD-10-CM | POA: Diagnosis not present

## 2020-11-02 DIAGNOSIS — Z8616 Personal history of COVID-19: Secondary | ICD-10-CM | POA: Insufficient documentation

## 2020-11-02 DIAGNOSIS — J449 Chronic obstructive pulmonary disease, unspecified: Secondary | ICD-10-CM | POA: Insufficient documentation

## 2020-11-02 DIAGNOSIS — I10 Essential (primary) hypertension: Secondary | ICD-10-CM | POA: Diagnosis not present

## 2020-11-02 LAB — URINALYSIS, ROUTINE W REFLEX MICROSCOPIC
Bacteria, UA: NONE SEEN
Bilirubin Urine: NEGATIVE
Glucose, UA: NEGATIVE mg/dL
Hgb urine dipstick: NEGATIVE
Ketones, ur: NEGATIVE mg/dL
Nitrite: NEGATIVE
Protein, ur: NEGATIVE mg/dL
Specific Gravity, Urine: 1.01 (ref 1.005–1.030)
pH: 6 (ref 5.0–8.0)

## 2020-11-02 LAB — CBC WITH DIFFERENTIAL/PLATELET
Abs Immature Granulocytes: 0.03 10*3/uL (ref 0.00–0.07)
Basophils Absolute: 0.1 10*3/uL (ref 0.0–0.1)
Basophils Relative: 1 %
Eosinophils Absolute: 0 10*3/uL (ref 0.0–0.5)
Eosinophils Relative: 0 %
HCT: 26.5 % — ABNORMAL LOW (ref 36.0–46.0)
Hemoglobin: 8.4 g/dL — ABNORMAL LOW (ref 12.0–15.0)
Immature Granulocytes: 1 %
Lymphocytes Relative: 25 %
Lymphs Abs: 1.6 10*3/uL (ref 0.7–4.0)
MCH: 32.3 pg (ref 26.0–34.0)
MCHC: 31.7 g/dL (ref 30.0–36.0)
MCV: 101.9 fL — ABNORMAL HIGH (ref 80.0–100.0)
Monocytes Absolute: 0.8 10*3/uL (ref 0.1–1.0)
Monocytes Relative: 13 %
Neutro Abs: 3.9 10*3/uL (ref 1.7–7.7)
Neutrophils Relative %: 60 %
Platelets: 342 10*3/uL (ref 150–400)
RBC: 2.6 MIL/uL — ABNORMAL LOW (ref 3.87–5.11)
RDW: 15.2 % (ref 11.5–15.5)
WBC: 6.4 10*3/uL (ref 4.0–10.5)
nRBC: 0 % (ref 0.0–0.2)

## 2020-11-02 LAB — TROPONIN I (HIGH SENSITIVITY)
Troponin I (High Sensitivity): 5 ng/L (ref <18)
Troponin I (High Sensitivity): 4 ng/L (ref <18)

## 2020-11-02 LAB — COMPREHENSIVE METABOLIC PANEL
ALT: 13 U/L (ref 0–44)
AST: 24 U/L (ref 15–41)
Albumin: 3.9 g/dL (ref 3.5–5.0)
Alkaline Phosphatase: 43 U/L (ref 38–126)
Anion gap: 5 (ref 5–15)
BUN: 25 mg/dL — ABNORMAL HIGH (ref 8–23)
CO2: 27 mmol/L (ref 22–32)
Calcium: 9.3 mg/dL (ref 8.9–10.3)
Chloride: 103 mmol/L (ref 98–111)
Creatinine, Ser: 0.67 mg/dL (ref 0.44–1.00)
GFR, Estimated: >60 mL/min (ref >60)
Glucose, Bld: 114 mg/dL — ABNORMAL HIGH (ref 70–99)
Potassium: 4.2 mmol/L (ref 3.5–5.1)
Sodium: 135 mmol/L (ref 135–145)
Total Bilirubin: 0.4 mg/dL (ref 0.3–1.2)
Total Protein: 7 g/dL (ref 6.5–8.1)

## 2020-11-02 LAB — TSH: TSH: 6.32 u[IU]/mL — ABNORMAL HIGH (ref 0.350–4.500)

## 2020-11-02 LAB — CBG MONITORING, ED: Glucose-Capillary: 104 mg/dL — ABNORMAL HIGH (ref 70–99)

## 2020-11-02 MED ORDER — SODIUM CHLORIDE 0.9 % IV BOLUS
1000.0000 mL | Freq: Once | INTRAVENOUS | Status: AC
  Administered 2020-11-02: 1000 mL via INTRAVENOUS

## 2020-11-02 NOTE — ED Provider Notes (Signed)
[facility] DEPT Provider Note   CSN: [id number] Arrival date & time: 11/02/20  1528     History Chief Complaint  Patient presents with   Weakness    [name] is a 86 y.o. female.  This is a 86 y.o. femalewith significant medical history as below, including COPD, asthma, diverticulitis, hypothyroidism who presents to the ED with complaint of generalized weakness.  Patient history of symptomatic anemia, prior complication associated with colonoscopy.  History of blood transfusion in the past.  Patient ports over the past month she is having progressive fatigue, worse in the past couple days.  No melena or blood per rectum.  No vomiting.  Reduced oral intake secondary to gastroparesis diet.  Urination changes.  No fevers or chills.  She reports generalized fatigue, no focal weakness.  No recent travel or sick contacts.  No recent medication changes.   The history is provided by the patient and a relative. No language interpreter was used.  Weakness Associated symptoms: no abdominal pain, no chest pain, no cough, no fever, no headaches, no nausea, no shortness of breath and no vomiting       Past Medical History:  Diagnosis Date   ABDOMINAL PAIN, CHRONIC 05/28/2008   ALLERGIC RHINITIS 09/21/2006   ANEMIA-NOS 09/21/2006   ASTHMA 09/21/2006   ASYMPTOMATIC POSTMENOPAUSAL STATUS 05/28/2008   Cataract    CHEST PAIN, ATYPICAL 05/28/2008   COPD (chronic obstructive pulmonary disease) ([place])    COVID-19 virus infection 02/2020   DDD (degenerative disc disease)    DIVERTICULITIS, HX OF 09/21/2006   GERD 09/21/2006   HYPERCHOLESTEROLEMIA 06/05/2009   HYPERTENSION 09/21/2006   HYPOTHYROIDISM 09/21/2006   Peptic ulcer disease    Purpura ([place])    RENAL INSUFFICIENCY 09/21/2006   Shingles    Small bowel obstruction (HCC)    Somatization disorder 04/25/2007   UNSPECIFIED URINARY CALCULUS 05/28/2008   UTI (urinary tract infection) 07/2019    Patient Active Problem  List   Diagnosis Date Noted   Symptomatic anemia 10/13/2020   GIB (gastrointestinal bleeding) 03/11/2020   Chronic ulcerative rectosigmoiditis with rectal bleeding (HCC)    Abnormal CT scan, gastrointestinal tract    LLQ abdominal pain 10/31/2019   Diverticulitis 10/31/2019   UTI (urinary tract infection) 08/06/2019   COPD (chronic obstructive pulmonary disease) (HCC)    Ulcerative colitis ([place])    Vitamin D deficiency 05/01/2019   Arthralgia 08/21/2017   Osteoporosis 11/21/2016   Postmenopausal status 09/06/2016   Other nonspecific abnormal finding of lung field 08/14/2015   Hyponatremia 07/21/2014   Menopausal state 07/18/2014   Hematest positive stools 08/05/2013   Anemia, blood loss 08/05/2013   Cerumen impaction 06/03/2013   History of adrenal disorder 10/15/2012   Rash and nonspecific skin eruption 10/15/2012   Iron deficiency anemia 06/20/2012   Psychogenic polydipsia 06/20/2012   Nonspecific abnormal electrocardiogram (ECG) (EKG) 06/20/2012   Polyuria 05/02/2012   Pelvic pain in female 08/16/2011   Leg ulcer ([place]) 06/16/2010   Routine general medical examination at a health care facility 06/11/2010   Encounter for long-term (current) use of other medications 06/11/2010   HYPERCHOLESTEROLEMIA 06/05/2009   UNSPECIFIED URINARY CALCULUS 05/28/2008   CHEST PAIN, ATYPICAL 05/28/2008   ABDOMINAL PAIN, CHRONIC 05/28/2008   ASYMPTOMATIC POSTMENOPAUSAL STATUS 05/28/2008   SOMATIZATION DISORDER 04/25/2007   Hypothyroidism 09/21/2006   Essential hypertension 09/21/2006   ALLERGIC RHINITIS 09/21/2006   ASTHMA 09/21/2006   GERD 09/21/2006   Disorder resulting from impaired renal function 09/21/2006  DIVERTICULITIS, HX OF 09/21/2006    Past Surgical History:  Procedure Laterality Date   ABDOMINAL HYSTERECTOMY  1981   acute Nephritis  [place]   BIOPSY  11/18/2019   Procedure: BIOPSY;  Surgeon: Ladene Artist, MD;  [facility];  Service:  Endoscopy;;   [place] N/A 11/18/2019   Procedure: FLEXIBLE SIGMOIDOSCOPY;  Surgeon: Ladene Artist, MD;  Location[facility];  Service: Endoscopy;  Laterality: N/A;   NASAL SINUS SURGERY  1967   Pulmonary thrombosis     TONSILLECTOMY     TUBAL LIGATION       OB History     Gravida  3   Para      Term      Preterm      AB      Living  2      SAB      IAB      Ectopic      Multiple      Live Births              Family History  Problem Relation Age of Onset   Polycystic kidney disease Mother    Pancreatic cancer Mother    Other Father        Schamberg disease   Bladder Cancer Father    Hypertension Father    Marfan syndrome Son    Hemochromatosis Son    Cirrhosis Son    Allergic rhinitis Sister    Other Brother        bone issue as child; multiple fractures but seemed to age out of this   Hemochromatosis Cousin    Arthritis Sister    Colon cancer Neg Hx    Esophageal cancer Neg Hx    Stomach cancer Neg Hx     Social History   Tobacco Use   Smoking status: Never   Smokeless tobacco: Never  Vaping Use   Vaping Use: Never used  Substance Use Topics   Alcohol use: No   Drug use: No    Home Medications Prior to Admission medications   Medication Sig Start Date End Date Taking? Authorizing Provider  acetaminophen (TYLENOL) 325 MG tablet Take 650 mg by mouth every 4 (four) hours as needed for mild pain or headache.     [provider]  Cholecalciferol (VITAMIN D3 PO) Take 1 tablet by mouth 2 (two) times a week. Saturday and Sunday    [provider]  Ferrous Sulfate (IRON) 325 (65 FE) MG TABS Take 1 tablet by mouth daily.     [provider]  fluticasone (FLOVENT HFA) 110 MCG/ACT inhaler Take 2 puffs by mouth twice daily 08/14/20   Koberlein, Steele Berg, MD  levothyroxine (SYNTHROID) 50 MCG tablet Take 1 tablet (50  mcg total) by mouth daily. 08/21/20   Koberlein, Steele Berg, MD  mesalamine (LIALDA) 1.2 g EC tablet TAKE 2 TABLETS BY MOUTH DAILY WITH BREAKFAST. 08/24/20   Willia Craze, NP  Spacer/Aero-Holding Josiah Lobo (E-Z SPACER) inhaler Use as instructed 11/08/17   Caren Macadam, MD    Allergies    [place] nut (berthollefia excelsa) skin test, Fluconazole, Metronidazole, Pepcid [famotidine], Shellfish allergy, Macrobid [nitrofurantoin], and Pantoprazole  Review of Systems   Review of Systems  Constitutional:  Positive for fatigue. Negative for chills and fever.  HENT:  Negative for facial swelling and trouble swallowing.   Eyes:  Negative for photophobia and visual disturbance.  Respiratory:  Negative for cough and shortness of breath.   Cardiovascular:  Negative for chest pain and palpitations.  Gastrointestinal:  Negative for abdominal pain, nausea and vomiting.  Endocrine: Negative for polydipsia and polyuria.  Genitourinary:  Negative for difficulty urinating and hematuria.  Musculoskeletal:  Negative for gait problem and joint swelling.  Skin:  Negative for pallor and rash.  Neurological:  Positive for weakness. Negative for syncope and headaches.  Psychiatric/Behavioral:  Negative for agitation and confusion.    Physical Exam Updated Vital Signs BP 122/63 (BP Location: Right Arm)   Pulse 78   Temp 98.2 F (36.8 C) (Oral)   Resp 18   Ht 5' 8"  (1.727 m)   Wt 64 kg   SpO2 99%   BMI 21.44 kg/m   Physical Exam Vitals and nursing note reviewed.  Constitutional:      General: She is not in acute distress.    Appearance: Normal appearance.  HENT:     Head: Normocephalic and atraumatic.     Right Ear: External ear normal.     Left Ear: External ear normal.     Nose: Nose normal.     Mouth/Throat:     Mouth: Mucous membranes are moist.  Eyes:     General: No scleral icterus.       Right eye: No discharge.        Left eye: No discharge.     Extraocular Movements: Extraocular  movements intact.     Pupils: Pupils are equal, round, and reactive to light.  Cardiovascular:     Rate and Rhythm: Normal rate and regular rhythm.     Pulses: Normal pulses.     Heart sounds: Normal heart sounds.  Pulmonary:     Effort: Pulmonary effort is normal. No respiratory distress.     Breath sounds: Normal breath sounds.  Abdominal:     General: Abdomen is flat.     Tenderness: There is no abdominal tenderness.  Musculoskeletal:        General: Normal range of motion.     Cervical back: Normal range of motion.     Right lower leg: No edema.     Left lower leg: No edema.  Skin:    General: Skin is warm and dry.     Capillary Refill: Capillary refill takes less than 2 seconds.  Neurological:     Mental Status: She is alert and oriented to person, place, and time.     GCS: GCS eye subscore is 4. GCS verbal subscore is 5. GCS motor subscore is 6.     Cranial Nerves: Cranial nerves are intact. No facial asymmetry.     Sensory: Sensation is intact.     Motor: Motor function is intact. No tremor.     Coordination: Coordination is intact.     Gait: Gait is intact.  Psychiatric:        Mood and Affect: Mood normal.        Behavior: Behavior normal.    ED Results / Procedures / Treatments   Labs (all labs ordered are listed, but only abnormal results are displayed) Labs Reviewed  URINALYSIS, ROUTINE W REFLEX MICROSCOPIC - Abnormal; Notable for the following components:      Result Value   Leukocytes,Ua TRACE (*)    All other components within normal limits  CBC WITH DIFFERENTIAL/PLATELET - Abnormal; Notable for the  following components:   RBC 2.60 (*)    Hemoglobin 8.4 (*)    HCT 26.5 (*)    MCV 101.9 (*)    All other components within normal limits  COMPREHENSIVE METABOLIC PANEL - Abnormal; Notable for the following components:   Glucose, Bld 114 (*)    BUN 25 (*)    All other components within normal limits  TSH - Abnormal; Notable for the following components:    TSH 6.320 (*)    All other components within normal limits  CBG MONITORING, ED - Abnormal; Notable for the following components:   Glucose-Capillary 104 (*)    All other components within normal limits  T4, FREE  TROPONIN I (HIGH SENSITIVITY)  TROPONIN I (HIGH SENSITIVITY)    EKG EKG Interpretation  Date/Time:  Monday November 02 2020 16:27:44 EDT Ventricular Rate:  78 PR Interval:  167 QRS Duration: 85 QT Interval:  376 QTC Calculation: 429 R Axis:   19 Text Interpretation: Sinus rhythm Abnormal R-wave progression, early transition Similar to prior tracing Confirmed by Wynona Dove ([id number]) on 11/02/2020 5:27:12 PM  Radiology DG Chest 2 View  Result Date: 11/02/2020 CLINICAL DATA:  Weakness EXAM: CHEST - 2 VIEW COMPARISON:  10/13/2020 FINDINGS: Mild elevation of right diaphragm with lobulation. No focal opacity, pleural effusion or pneumothorax. Stable cardiomediastinal silhouette. Moderate to large hiatal hernia IMPRESSION: No active cardiopulmonary disease.  Hiatal hernia Electronically Signed   By: Donavan Foil M.D.   On: 11/02/2020 17:55    Procedures Procedures   Medications Ordered in ED Medications  sodium chloride 0.9 % bolus 1,000 mL (0 mLs Intravenous Stopped 11/02/20 1816)    ED Course  I have reviewed the triage vital signs and the nursing notes.  Pertinent labs & imaging results that were available during my care of the patient were reviewed by me and considered in my medical decision making (see chart for details).    MDM Rules/Calculators/A&P                         This patient complains of fatigue; this involves an extensive number of treatment options and is a complaint that carries with it a high risk of complications and Morbidity. Vital signs reviewed and are stable. Serious etiologies considered.   I ordered, reviewed and interpreted labs. UA without acute infection. TSH elevated although free t4 WNL. Labs otherwise stable  CXR negative, troponin is  not elevated, ECG without acute ischemia, no ongoing chest pain ACS is unlikely    I ordered imaging studies which included CXR and I independently    visualized and interpreted imaging which showed no acute process  Additional history obtained from family  Previous records obtained and reviewed   Patient with chronic fatigue, she also has chronic anemia which is not acutely worsened from her baseline. I doubt anemia is the cause of her fatigue. She has been dealing with this problem for weeks to months. She has some dietary changes over the past few weeks and is eating mostly liquid diet to help with her gastroparesis which could be a factor today. She overall is well appearing with a non-focal neuro exam and re-assuring physical exam and laboratory evaluation. Encouraged patient to advance oral diet as tolerated. She is ambulatory and repeat neuro exam is non-focal. No emergent etiology for fatigue identified in ED today.  The patient improved significantly and was discharged in stable condition. Detailed discussions were had with the patient regarding current  findings, and need for close f/u with PCP or on call doctor. The patient has been instructed to return immediately if the symptoms worsen in any way for re-evaluation. Patient verbalized understanding and is in agreement with current care plan. All questions answered prior to discharge.    Final Clinical Impression(s) / ED Diagnoses Final diagnoses:  Chronic fatigue  Anemia, unspecified type    Rx / DC Orders ED Discharge Orders     None        Jeanell Sparrow, DO 11/03/20 [phone]

## 2020-11-02 NOTE — ED Triage Notes (Signed)
Pt. C/o generalized weakness x1 day. Hx of anemia. Recent hospitalization for blood transfusion.

## 2020-11-02 NOTE — ED Notes (Signed)
Pt care taken, no complaints at this time, just cleaned bed, offered something to drink, awaiting results of labs

## 2020-11-02 NOTE — ED Provider Notes (Signed)
Emergency Medicine Provider Triage Evaluation Note  [name] , a 86 y.o. female  was evaluated in triage.  Pt complains of generalized weakness.  Over the past 4 hours today, patient has become much weaker, diffusely.  No focal weakness.  History of similar few weeks ago and required a blood transfusion.  History of anemia due to occult GI bleeding.  Patient is not on a blood thinner.  No fevers.  No infectious symptoms.  No chest pain  Review of Systems  Positive: weakness Negative: fever  Physical Exam  BP (!) 141/77 (BP Location: Right Arm)   Pulse 84   Temp 98.9 F (37.2 C) (Oral)   Resp 16   Ht 5' 8"  (1.727 m)   Wt 64 kg   SpO2 100%   BMI 21.44 kg/m  Gen:   Awake, no distress   Resp:  Normal effort  MSK:   Moves extremities without difficulty  Other:  No ttp of abd  Medical Decision Making  Medically screening exam initiated at 3:57 PM.  Appropriate orders placed.  [name] was informed that the remainder of the evaluation will be completed by another provider, this initial triage assessment does not replace that evaluation, and the importance of remaining in the ED until their evaluation is complete.  Labs, ekg, cxr, Amado Nash, PA-C 11/02/20 1614    Lajean Saver, MD 11/03/20 [phone]

## 2020-11-03 LAB — T4, FREE: Free T4: 0.86 ng/dL (ref 0.61–1.12)

## 2020-11-13 ENCOUNTER — Other Ambulatory Visit: Payer: Self-pay

## 2020-11-13 ENCOUNTER — Ambulatory Visit (INDEPENDENT_AMBULATORY_CARE_PROVIDER_SITE_OTHER): Payer: Medicare Other | Admitting: Family Medicine

## 2020-11-13 ENCOUNTER — Encounter: Payer: Self-pay | Admitting: Family Medicine

## 2020-11-13 VITALS — BP 118/70 | HR 83 | Temp 98.2°F | Ht 68.0 in | Wt 148.5 lb

## 2020-11-13 DIAGNOSIS — D649 Anemia, unspecified: Secondary | ICD-10-CM

## 2020-11-13 DIAGNOSIS — E039 Hypothyroidism, unspecified: Secondary | ICD-10-CM | POA: Diagnosis not present

## 2020-11-13 DIAGNOSIS — J449 Chronic obstructive pulmonary disease, unspecified: Secondary | ICD-10-CM

## 2020-11-13 DIAGNOSIS — I10 Essential (primary) hypertension: Secondary | ICD-10-CM

## 2020-11-13 NOTE — Patient Instructions (Addendum)
[place] [facility] center in [place], [place] Address: [address], [place], [place] [address] Phone: [phone]  (Dr. Irene Limbo)   I will call you to set up lab visit once I see when your visit with Dr. Irene Limbo is set up.

## 2020-11-13 NOTE — Progress Notes (Signed)
[name] DOB: November 09, 1934 Encounter date: 11/13/2020  This is a 86 y.o. female who presents with Chief Complaint  Patient presents with   Follow-up    History of present illness: Feels like the bottom drops out - weak, just feels bad. Bp not that low - although was down to 40 at one hosp visit. Just feels worse. No pattern - sometimes hits in middle of day; sometimes feels bad all day. Even today diastolic as low as 40 today. Highest diastolic she sees is 70. Has been up to 33.   Feels weaker than she has felt through life - even with really chronic issues.   Tries to space out activities/chores.   Has noted some dizziness in last couple of days.   Had covid in January - was in [place] for a couple of weeks at that time.  Still dealing with other chronic conditions - purpura, skin lesions. Doesn't do well not doing activity. Skin on feet, calves burns and stings constantly when she is up on feet. Uses ice to stop the cycle of pain. Just constant feeling of skin going to split. Keeps vasoline on legs which helps them not feeling like it's going to split. Ankles more tender than other areas - pain is much worse than how skin looks.   Last night got 6 hours sleep - this is really good for her.   Eating ok; appetite is good, but she is limited to what she can eat due to chronic gut issues.   Has lost 2/3 of hair since January.   Allergies  Allergen Reactions   [place] Nut Daisey Must) Skin Test Anaphylaxis   Fluconazole Shortness Of Breath   Metronidazole Shortness Of Breath and Nausea And Vomiting   Pepcid [Famotidine] Shortness Of Breath and Other (See Comments)    Dizziness   Shellfish Allergy Anaphylaxis   Macrobid [Nitrofurantoin]     REACTION: Syncope   Pantoprazole Other (See Comments)    CHEST PAIN   Current Meds  Medication Sig   Acetaminophen (TYLENOL PO) Take 200 mg by mouth as needed.   Ferrous Sulfate (IRON) 325 (65 FE) MG TABS Take 1 tablet by  mouth daily.    fluticasone (FLOVENT HFA) 110 MCG/ACT inhaler Take 2 puffs by mouth twice daily   levothyroxine (SYNTHROID) 50 MCG tablet Take 1 tablet (50 mcg total) by mouth daily.   mesalamine (LIALDA) 1.2 g EC tablet TAKE 2 TABLETS BY MOUTH DAILY WITH BREAKFAST.   Spacer/Aero-Holding Chambers (E-Z SPACER) inhaler Use as instructed    Review of Systems  Constitutional:  Positive for fatigue. Negative for chills and fever.  Respiratory:  Negative for cough, chest tightness, shortness of breath and wheezing.   Cardiovascular:  Negative for chest pain, palpitations and leg swelling.  Gastrointestinal:  Positive for abdominal pain.  Skin:  Positive for color change and rash.       Red painful macules that appear on skin - mostly arms, legs and then become brownish. Areas dry out and crack; painful if she doesn't keep vasoline over these.   Hematological:  Positive for adenopathy.   Objective:  BP 118/70 (BP Location: Left Arm, Patient Position: Sitting, Cuff Size: Normal)   Pulse 83   Temp 98.2 F (36.8 C) (Oral)   Ht 5' 8"  (1.727 m)   Wt 148 lb 8 oz (67.4 kg)   SpO2 95%   BMI 22.58 kg/m   Weight: 148 lb 8 oz (67.4 kg)   BP Readings from  Last 3 Encounters:  11/13/20 118/70  11/02/20 122/63  10/21/20 140/64   Wt Readings from Last 3 Encounters:  11/13/20 148 lb 8 oz (67.4 kg)  11/02/20 141 lb (64 kg)  10/21/20 147 lb 1.6 oz (66.7 kg)    Physical Exam Constitutional:      General: She is not in acute distress.    Appearance: She is well-developed.  Cardiovascular:     Rate and Rhythm: Normal rate and regular rhythm.     Heart sounds: Normal heart sounds. No murmur heard.   No friction rub.  Pulmonary:     Effort: Pulmonary effort is normal. No respiratory distress.     Breath sounds: Normal breath sounds. No wheezing or rales.  Abdominal:     General: Abdomen is flat. Bowel sounds are normal. There is no distension.     Palpations: Abdomen is soft.     Tenderness:  There is no abdominal tenderness.  Musculoskeletal:     Right lower leg: No edema.     Left lower leg: No edema.  Neurological:     Mental Status: She is alert and oriented to person, place, and time.  Psychiatric:        Behavior: Behavior normal.    Assessment/Plan  1. Anemia, unspecified type Patient has appointment now to see hematology. This has been an ongoing struggle for her. She has had intermittent blood loss in stool and diagnosis of proctosigmoid colitis.   2. Essential hypertension Blood pressure has been running low normal. Encouraged her to check at home on regular basis. She does not feel well when pressure drops lower, but pressure in office today is normal. She is good about staying well hydrated.  3. Chronic obstructive pulmonary disease, unspecified COPD type ([place]) Breathing has been stable with flovent inhaler.   4. Hypothyroidism, unspecified type Retest thyroid with upcoming bloodwork. TSH elevated when she was in the hospital; but would like recheck before med adjustment as she has not been very stable with numbers recently.    Return for pending bloodwork.   40 minutes spent in time with patient, exam, chart review, charting, follow up plan discussion.   Micheline Rough, MD

## 2020-11-16 ENCOUNTER — Ambulatory Visit: Payer: Medicare Other | Admitting: Family Medicine

## 2020-11-17 ENCOUNTER — Telehealth: Payer: Self-pay | Admitting: Hematology and Oncology

## 2020-11-17 NOTE — Telephone Encounter (Signed)
Pt called and requested to r/s appt from January. She had gotten sick and was unable to make her initial appt. Now that she is recovered she wanted to f/u. I scheduled the appt and she is aware.

## 2020-11-18 ENCOUNTER — Telehealth: Payer: Self-pay | Admitting: *Deleted

## 2020-11-18 NOTE — Telephone Encounter (Signed)
-----   Message from Caren Macadam, MD sent at 11/18/2020  6:48 AM EDT ----- I see that she has upcoming visit with hematology, which is great. I have ordered TSH (thyroid) lab that hopefully they can add to whatever bloodwork they perform. I sent the doc that she is seeing a message as well, so hopefully we can work together to get her feeling better. She may need to remind lab at her visit to add the TSH that I ordered.

## 2020-11-18 NOTE — Telephone Encounter (Signed)
Attempted to reach the patient at the home number twice and there was a clicking sound with no answer.  Spoke with the patient's son and informed him of the message below.  He stated he will be taking the patient to appt and will remind her to ask for testing as below.

## 2020-11-20 ENCOUNTER — Ambulatory Visit: Payer: Medicare Other | Admitting: Family Medicine

## 2020-11-22 ENCOUNTER — Other Ambulatory Visit: Payer: Self-pay | Admitting: Family Medicine

## 2020-11-23 ENCOUNTER — Telehealth: Payer: Self-pay | Admitting: Hematology and Oncology

## 2020-11-23 NOTE — Telephone Encounter (Signed)
Called patient regarding 10/18 rescheduled appointment time, spoke with patient's son. Patient will be notified.

## 2020-11-24 ENCOUNTER — Inpatient Hospital Stay: Payer: Medicare Other

## 2020-11-24 ENCOUNTER — Inpatient Hospital Stay: Payer: Medicare Other | Admitting: Hematology and Oncology

## 2020-11-25 ENCOUNTER — Inpatient Hospital Stay: Payer: Medicare Other | Attending: Hematology and Oncology | Admitting: Hematology and Oncology

## 2020-11-25 ENCOUNTER — Inpatient Hospital Stay: Payer: Medicare Other

## 2020-11-25 ENCOUNTER — Other Ambulatory Visit: Payer: Self-pay | Admitting: *Deleted

## 2020-11-25 DIAGNOSIS — I10 Essential (primary) hypertension: Secondary | ICD-10-CM | POA: Insufficient documentation

## 2020-11-25 DIAGNOSIS — Z79899 Other long term (current) drug therapy: Secondary | ICD-10-CM | POA: Insufficient documentation

## 2020-11-25 DIAGNOSIS — E871 Hypo-osmolality and hyponatremia: Secondary | ICD-10-CM

## 2020-11-25 DIAGNOSIS — J449 Chronic obstructive pulmonary disease, unspecified: Secondary | ICD-10-CM | POA: Insufficient documentation

## 2020-11-25 DIAGNOSIS — D649 Anemia, unspecified: Secondary | ICD-10-CM | POA: Insufficient documentation

## 2020-11-25 DIAGNOSIS — E039 Hypothyroidism, unspecified: Secondary | ICD-10-CM | POA: Insufficient documentation

## 2020-11-25 NOTE — Progress Notes (Signed)
Error

## 2020-11-25 NOTE — Progress Notes (Deleted)
[facility] [facility] CONSULT NOTE  Patient Care Team: Caren Macadam, MD as PCP - General (Family Medicine) Jari Pigg, MD as Attending Physician (Dermatology) Clent Jacks, MD (Ophthalmology)  CHIEF COMPLAINTS/PURPOSE OF CONSULTATION:  ***  ASSESSMENT & PLAN:  No problem-specific Assessment & Plan notes found for this encounter.  No orders of the defined types were placed in this encounter.    HISTORY OF PRESENTING ILLNESS:  [name] 86 y.o. female is here because of ***  REVIEW OF SYSTEMS:   Constitutional: Denies fevers, chills or abnormal night sweats Eyes: Denies blurriness of vision, double vision or watery eyes Ears, nose, mouth, throat, and face: Denies mucositis or sore throat Respiratory: Denies cough, dyspnea or wheezes Cardiovascular: Denies palpitation, chest discomfort or lower extremity swelling Gastrointestinal:  Denies nausea, heartburn or change in bowel habits Skin: Denies abnormal skin rashes Lymphatics: Denies new lymphadenopathy or easy bruising Neurological:Denies numbness, tingling or new weaknesses Behavioral/Psych: Mood is stable, no new changes  All other systems were reviewed with the patient and are negative.  MEDICAL HISTORY:  Past Medical History:  Diagnosis Date   ABDOMINAL PAIN, CHRONIC 05/28/2008   ALLERGIC RHINITIS 09/21/2006   ANEMIA-NOS 09/21/2006   ASTHMA 09/21/2006   ASYMPTOMATIC POSTMENOPAUSAL STATUS 05/28/2008   Cataract    CHEST PAIN, ATYPICAL 05/28/2008   COPD (chronic obstructive pulmonary disease) ([place])    COVID-19 virus infection 02/2020   DDD (degenerative disc disease)    DIVERTICULITIS, HX OF 09/21/2006   GERD 09/21/2006   HYPERCHOLESTEROLEMIA 06/05/2009   HYPERTENSION 09/21/2006   HYPOTHYROIDISM 09/21/2006   Peptic ulcer disease    Purpura ([place])    RENAL INSUFFICIENCY 09/21/2006   Shingles    Small bowel obstruction ([place])    Somatization disorder 04/25/2007   UNSPECIFIED URINARY CALCULUS 05/28/2008   UTI  (urinary tract infection) 07/2019    SURGICAL HISTORY: Past Surgical History:  Procedure Laterality Date   ABDOMINAL HYSTERECTOMY  1981   acute Nephritis  [place]   BIOPSY  11/18/2019   Procedure: BIOPSY;  Surgeon: Ladene Artist, MD;  Location: [facility] ENDOSCOPY;  Service: Endoscopy;;   [place] N/A 11/18/2019   Procedure: FLEXIBLE SIGMOIDOSCOPY;  Surgeon: Ladene Artist, MD;  Location: [facility];  Service: Endoscopy;  Laterality: N/A;   NASAL SINUS SURGERY  1967   Pulmonary thrombosis     TONSILLECTOMY     TUBAL LIGATION      SOCIAL HISTORY: Social History   Socioeconomic History   Marital status: Divorced    Spouse name: Not on file   Number of children: 3   Years of education: Not on file   Highest education level: Not on file  Occupational History   Occupation: retired  Tobacco Use   Smoking status: Never   Smokeless tobacco: Never  Vaping Use   Vaping Use: Never used  Substance and Sexual Activity   Alcohol use: No   Drug use: No   Sexual activity: Not Currently  Other Topics Concern   Not on file  Social History Narrative   Not on file   Social Determinants of Health   Financial Resource Strain: Low Risk    Difficulty of Paying Living Expenses: Not hard at all  Food Insecurity: No Food Insecurity   Worried About [place] in the Last Year: Never true  Ran Out of Food in the Last Year: Never true  Transportation Needs: No Transportation Needs   Lack of Transportation (Medical): No   Lack of Transportation (Non-Medical): No  Physical Activity: Inactive   Days of Exercise per Week: 0 days   Minutes of Exercise per Session: 0 min  Stress: No Stress Concern Present   Feeling of Stress : Only a little  Social Connections: Moderately Isolated   Frequency of Communication with Friends and Family: More than three times a week    Frequency of Social Gatherings with Friends and Family: Three times a week   Attends Religious Services: More than 4 times per year   Active Member of Clubs or Organizations: No   Attends Archivist Meetings: Never   Marital Status: Widowed  Human resources officer Violence: Not At Risk   Fear of Current or Ex-Partner: No   Emotionally Abused: No   Physically Abused: No   Sexually Abused: No    FAMILY HISTORY: Family History  Problem Relation Age of Onset   Polycystic kidney disease Mother    Pancreatic cancer Mother    Other Father        Schamberg disease   Bladder Cancer Father    Hypertension Father    Marfan syndrome Son    Hemochromatosis Son    Cirrhosis Son    Allergic rhinitis Sister    Other Brother        bone issue as child; multiple fractures but seemed to age out of this   Hemochromatosis Cousin    Arthritis Sister    Colon cancer Neg Hx    Esophageal cancer Neg Hx    Stomach cancer Neg Hx     ALLERGIES:  is allergic to [place] nut (berthollefia excelsa) skin test, fluconazole, metronidazole, pepcid [famotidine], shellfish allergy, macrobid [nitrofurantoin], and pantoprazole.  MEDICATIONS:  Current Outpatient Medications  Medication Sig Dispense Refill   Acetaminophen (TYLENOL PO) Take 200 mg by mouth as needed.     Ferrous Sulfate (IRON) 325 (65 FE) MG TABS Take 1 tablet by mouth daily.      fluticasone (FLOVENT HFA) 110 MCG/ACT inhaler Take 2 puffs by mouth twice daily 12 g 11   levothyroxine (SYNTHROID) 50 MCG tablet Take 1 tablet (50 mcg total) by mouth daily. 90 tablet 1   mesalamine (LIALDA) 1.2 g EC tablet TAKE 2 TABLETS BY MOUTH DAILY WITH BREAKFAST. 180 tablet 1   Spacer/Aero-Holding Chambers (E-Z SPACER) inhaler Use as instructed 1 each 2   No current facility-administered medications for this visit.     PHYSICAL EXAMINATION: ECOG PERFORMANCE STATUS: {CHL ONC ECOG [phone]}  There were no vitals filed for this visit. There were  no vitals filed for this visit.  GENERAL:alert, no distress and comfortable SKIN: skin color, texture, turgor are normal, no rashes or significant lesions EYES: normal, conjunctiva are pink and non-injected, sclera clear OROPHARYNX:no exudate, no erythema and lips, buccal mucosa, and tongue normal  NECK: supple, thyroid normal size, non-tender, without nodularity LYMPH:  no palpable lymphadenopathy in the cervical, axillary or inguinal LUNGS: clear to auscultation and percussion with normal breathing effort HEART: regular rate & rhythm and no murmurs and no lower extremity edema ABDOMEN:abdomen soft, non-tender and normal bowel sounds Musculoskeletal:no cyanosis of digits and no clubbing  PSYCH: alert & oriented x 3 with fluent speech NEURO: no focal motor/sensory deficits  LABORATORY DATA:  I have reviewed the data as listed Lab Results  Component Value Date   WBC 6.4  11/02/2020   HGB 8.4 (L) 11/02/2020   HCT 26.5 (L) 11/02/2020   MCV 101.9 (H) 11/02/2020   PLT 342 11/02/2020     Chemistry      Component Value Date/Time   NA 135 11/02/2020 1625   K 4.2 11/02/2020 1625   CL 103 11/02/2020 1625   CO2 27 11/02/2020 1625   BUN 25 (H) 11/02/2020 1625   CREATININE 0.67 11/02/2020 1625   CREATININE 0.64 08/14/2020 1618      Component Value Date/Time   CALCIUM 9.3 11/02/2020 1625   CALCIUM 10.6 (H) 06/06/2009 0142   ALKPHOS 43 11/02/2020 1625   AST 24 11/02/2020 1625   ALT 13 11/02/2020 1625   BILITOT 0.4 11/02/2020 1625       RADIOGRAPHIC STUDIES: I have personally reviewed the radiological images as listed and agreed with the findings in the report. DG Chest 2 View  Result Date: 11/02/2020 CLINICAL DATA:  Weakness EXAM: CHEST - 2 VIEW COMPARISON:  10/13/2020 FINDINGS: Mild elevation of right diaphragm with lobulation. No focal opacity, pleural effusion or pneumothorax. Stable cardiomediastinal silhouette. Moderate to large hiatal hernia IMPRESSION: No active  cardiopulmonary disease.  Hiatal hernia Electronically Signed   By: Donavan Foil M.D.   On: 11/02/2020 17:55    All questions were answered. The patient knows to call the clinic with any problems, questions or concerns. I spent *** minutes in the care of this patient including H and P, review of records, counseling and coordination of care.     Benay Pike, MD 11/25/2020 11:59 AM

## 2020-11-30 ENCOUNTER — Telehealth: Payer: Self-pay | Admitting: Oncology

## 2020-11-30 NOTE — Telephone Encounter (Signed)
Rescheduled past appointment per patient's request. Patient is aware of changes.

## 2020-12-04 ENCOUNTER — Inpatient Hospital Stay: Payer: Medicare Other | Admitting: *Deleted

## 2020-12-04 ENCOUNTER — Other Ambulatory Visit: Payer: Self-pay

## 2020-12-04 ENCOUNTER — Inpatient Hospital Stay (HOSPITAL_BASED_OUTPATIENT_CLINIC_OR_DEPARTMENT_OTHER): Payer: Medicare Other | Admitting: Oncology

## 2020-12-04 VITALS — BP 144/73 | HR 92 | Temp 98.6°F | Resp 17 | Wt 147.1 lb

## 2020-12-04 DIAGNOSIS — I10 Essential (primary) hypertension: Secondary | ICD-10-CM | POA: Diagnosis not present

## 2020-12-04 DIAGNOSIS — D649 Anemia, unspecified: Secondary | ICD-10-CM | POA: Diagnosis not present

## 2020-12-04 DIAGNOSIS — E039 Hypothyroidism, unspecified: Secondary | ICD-10-CM

## 2020-12-04 DIAGNOSIS — Z79899 Other long term (current) drug therapy: Secondary | ICD-10-CM

## 2020-12-04 DIAGNOSIS — J449 Chronic obstructive pulmonary disease, unspecified: Secondary | ICD-10-CM | POA: Diagnosis not present

## 2020-12-04 LAB — CBC WITH DIFFERENTIAL (CANCER CENTER ONLY)
Abs Immature Granulocytes: 0.01 10*3/uL (ref 0.00–0.07)
Basophils Absolute: 0.1 10*3/uL (ref 0.0–0.1)
Basophils Relative: 2 %
Eosinophils Absolute: 0 10*3/uL (ref 0.0–0.5)
Eosinophils Relative: 0 %
HCT: 30.7 % — ABNORMAL LOW (ref 36.0–46.0)
Hemoglobin: 10 g/dL — ABNORMAL LOW (ref 12.0–15.0)
Immature Granulocytes: 0 %
Lymphocytes Relative: 16 %
Lymphs Abs: 1.1 10*3/uL (ref 0.7–4.0)
MCH: 31.8 pg (ref 26.0–34.0)
MCHC: 32.6 g/dL (ref 30.0–36.0)
MCV: 97.8 fL (ref 80.0–100.0)
Monocytes Absolute: 0.8 10*3/uL (ref 0.1–1.0)
Monocytes Relative: 12 %
Neutro Abs: 4.9 10*3/uL (ref 1.7–7.7)
Neutrophils Relative %: 70 %
Platelet Count: 368 10*3/uL (ref 150–400)
RBC: 3.14 MIL/uL — ABNORMAL LOW (ref 3.87–5.11)
RDW: 12.9 % (ref 11.5–15.5)
WBC Count: 6.9 10*3/uL (ref 4.0–10.5)
nRBC: 0 % (ref 0.0–0.2)

## 2020-12-04 LAB — IRON AND TIBC
Iron: 42 ug/dL (ref 41–142)
Saturation Ratios: 14 % — ABNORMAL LOW (ref 21–57)
TIBC: 300 ug/dL (ref 236–444)
UIBC: 258 ug/dL (ref 120–384)

## 2020-12-04 LAB — VITAMIN B12: Vitamin B-12: 463 pg/mL (ref 180–914)

## 2020-12-04 LAB — RETICULOCYTES
Immature Retic Fract: 10.5 % (ref 2.3–15.9)
RBC.: 3.1 MIL/uL — ABNORMAL LOW (ref 3.87–5.11)
Retic Count, Absolute: 54.3 10*3/uL (ref 19.0–186.0)
Retic Ct Pct: 1.8 % (ref 0.4–3.1)

## 2020-12-04 LAB — DIRECT ANTIGLOBULIN TEST (NOT AT ARMC)
DAT, IgG: NEGATIVE
DAT, complement: NEGATIVE

## 2020-12-04 LAB — SAMPLE TO BLOOD BANK

## 2020-12-04 LAB — FERRITIN: Ferritin: 35 ng/mL (ref 11–307)

## 2020-12-04 LAB — LACTATE DEHYDROGENASE: LDH: 176 U/L (ref 98–192)

## 2020-12-04 LAB — TSH: TSH: 5.436 u[IU]/mL — ABNORMAL HIGH (ref 0.308–3.960)

## 2020-12-04 NOTE — Progress Notes (Signed)
Reason for the request:    Anemia  HPI: I was asked by Dr. 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  She is an 86 year old woman with multiple comorbid conditions including COPD history of colitis who reports chronic skin manifestation and had been told in the past that is porphyria.  He has not had any specific treatment or evaluation at this time.  She have also been noted to have chronic anemia dating back to 2008 although has worsened in the last 2 years.  Her hemoglobin was down to 7.1 in March 2022 but it was up to 11.6 in July 2022.  In September 2022 her CBC showed a hemoglobin of 7.1 white cell count of 7.0 platelet count of 311.  She did receive for packed red cell transfusion and repeat CBC in September 14 was 9.3 and on 11/02/2020 hemoglobin was 8.4.  Iron studies obtained on 10/13/2020 showed iron of 150 saturation of 52% with ferritin 23.  She had a 24-hour urine collection for porphyrins which showed a elevated uroporphyrin 1 with findings are nonspecific for an exam for Faera condition.  Clinically, she reports constellation of symptoms predominantly with fatigue and tiredness and dyspnea on exertion related to her COPD.  He does ambulate with the help of a cane inside of her apartment.  She does have some arthritic bone pain which limits her mobility.  She denies any hematochezia, or hemoptysis.  She does report some GI issues and has been on a gastroparesis diet which has helped her symptoms.   She does not report any headaches, blurry vision, syncope or seizures. Does not report any fevers, chills or sweats.  Does not report any cough, wheezing or hemoptysis.  Does not report any chest pain, palpitation, orthopnea or leg edema.  Does not report any nausea, vomiting or abdominal pain.  Does not report any constipation or diarrhea.  Does not report any skeletal complaints.    Does not report frequency, urgency or hematuria.  Does not report any skin rashes or  lesions. Does not report any heat or cold intolerance.  Does not report any lymphadenopathy or petechiae.  Does not report any anxiety or depression.  Remaining review of systems is negative.     Past Medical History:  Diagnosis Date   ABDOMINAL PAIN, CHRONIC 05/28/2008   ALLERGIC RHINITIS 09/21/2006   ANEMIA-NOS 09/21/2006   ASTHMA 09/21/2006   ASYMPTOMATIC POSTMENOPAUSAL STATUS 05/28/2008   Cataract    CHEST PAIN, ATYPICAL 05/28/2008   COPD (chronic obstructive pulmonary disease) ([place])    COVID-19 virus infection 02/2020   DDD (degenerative disc disease)    DIVERTICULITIS, HX OF 09/21/2006   GERD 09/21/2006   HYPERCHOLESTEROLEMIA 06/05/2009   HYPERTENSION 09/21/2006   HYPOTHYROIDISM 09/21/2006   Peptic ulcer disease    Purpura ([place])    RENAL INSUFFICIENCY 09/21/2006   Shingles    Small bowel obstruction ([facility])    Somatization disorder 04/25/2007   UNSPECIFIED URINARY CALCULUS 05/28/2008   UTI (urinary tract infection) 07/2019  :   Past Surgical History:  Procedure Laterality Date   ABDOMINAL HYSTERECTOMY  1981   acute Nephritis  [place]   BIOPSY  11/18/2019   Procedure: BIOPSY;  Surgeon: Ladene Artist, MD;  Location: [facility];  Service: Endoscopy;;   [place] N/A 11/18/2019  Procedure: FLEXIBLE SIGMOIDOSCOPY;  Surgeon: Ladene Artist, MD;  Location: [facility];  Service: Endoscopy;  Laterality: N/A;   NASAL SINUS SURGERY  1967   Pulmonary thrombosis     TONSILLECTOMY     TUBAL LIGATION    :   Current Outpatient Medications:    Acetaminophen (TYLENOL PO), Take 200 mg by mouth as needed., Disp: , Rfl:    Ferrous Sulfate (IRON) 325 (65 FE) MG TABS, Take 1 tablet by mouth daily. , Disp: , Rfl:    fluticasone (FLOVENT HFA) 110 MCG/ACT inhaler, Take 2 puffs by mouth twice daily, Disp: 12 g, Rfl: 11   levothyroxine (SYNTHROID) 50 MCG tablet, Take 1  tablet (50 mcg total) by mouth daily., Disp: 90 tablet, Rfl: 1   mesalamine (LIALDA) 1.2 g EC tablet, TAKE 2 TABLETS BY MOUTH DAILY WITH BREAKFAST., Disp: 180 tablet, Rfl: 1   Spacer/Aero-Holding Chambers (E-Z SPACER) inhaler, Use as instructed, Disp: 1 each, Rfl: 2:   Allergies  Allergen Reactions   [place] Nut (Berthollefia Excelsa) Skin Test Anaphylaxis   Fluconazole Shortness Of Breath   Metronidazole Shortness Of Breath and Nausea And Vomiting   Pepcid [Famotidine] Shortness Of Breath and Other (See Comments)    Dizziness   Shellfish Allergy Anaphylaxis   Macrobid [Nitrofurantoin]     REACTION: Syncope   Pantoprazole Other (See Comments)    CHEST PAIN  :   Family History  Problem Relation Age of Onset   Polycystic kidney disease Mother    Pancreatic cancer Mother    Other Father        Schamberg disease   Bladder Cancer Father    Hypertension Father    Marfan syndrome Son    Hemochromatosis Son    Cirrhosis Son    Allergic rhinitis Sister    Other Brother        bone issue as child; multiple fractures but seemed to age out of this   Hemochromatosis Cousin    Arthritis Sister    Colon cancer Neg Hx    Esophageal cancer Neg Hx    Stomach cancer Neg Hx   :   Social History   Socioeconomic History   Marital status: Divorced    Spouse name: Not on file   Number of children: 3   Years of education: Not on file   Highest education level: Not on file  Occupational History   Occupation: retired  Tobacco Use   Smoking status: Never   Smokeless tobacco: Never  Vaping Use   Vaping Use: Never used  Substance and Sexual Activity   Alcohol use: No   Drug use: No   Sexual activity: Not Currently  Other Topics Concern   Not on file  Social History Narrative   Not on file   Social Determinants of Health   Financial Resource Strain: Low Risk    Difficulty of Paying Living Expenses: Not hard at all  Food Insecurity: No Food Insecurity   Worried About Ship broker in the Last Year: Never true   Ran Out of Food in the Last Year: Never true  Transportation Needs: No Transportation Needs   Lack of Transportation (Medical): No   Lack of Transportation (Non-Medical): No  Physical Activity: Inactive   Days of Exercise per Week: 0 days   Minutes of Exercise per Session: 0 min  Stress: No Stress Concern Present   Feeling of Stress : Only a little  Social Connections: Moderately Isolated   Frequency  of Communication with Friends and Family: More than three times a week   Frequency of Social Gatherings with Friends and Family: Three times a week   Attends Religious Services: More than 4 times per year   Active Member of Clubs or Organizations: No   Attends Archivist Meetings: Never   Marital Status: Widowed  Human resources officer Violence: Not At Risk   Fear of Current or Ex-Partner: No   Emotionally Abused: No   Physically Abused: No   Sexually Abused: No  :  Pertinent items are noted in HPI.  Exam: Blood pressure (!) 144/73, pulse 92, temperature 98.6 F (37 C), temperature source Tympanic, resp. rate 17, weight 147 lb 1 oz (66.7 kg), SpO2 99 %. ECOG 1 General appearance: alert and cooperative appeared without distress. Head: atraumatic without any abnormalities. Eyes: conjunctivae/corneas clear. PERRL.  Sclera anicteric. Throat: lips, mucosa, and tongue normal; without oral thrush or ulcers. Resp: clear to auscultation bilaterally without rhonchi, wheezes or dullness to percussion. Cardio: regular rate and rhythm, S1, S2 normal, no murmur, click, rub or gallop GI: soft, non-tender; bowel sounds normal; no masses,  no organomegaly Skin: Skin color, texture, turgor normal. No rashes or lesions Lymph nodes: Cervical, supraclavicular, and axillary nodes normal. Neurologic: Grossly normal without any motor, sensory or deep tendon reflexes. Musculoskeletal: No joint deformity or effusion.    Assessment and Plan:    86 year old  woman with:  1.  Anemia with normocytic and normochromic findings dating back to at least 2008 and worse in the last 2 years.  The differential diagnosis was discussed at this time.  Chronic GI blood losses from colitis could be a contributing factor.  Chronic disease anemia could also be a possibility.  Bone marrow disease has not been evaluated condition such as myelodysplastic syndrome or plasma cell disorder could be a consideration but less likely.  Her kidney function is normal and I see no evidence to suggest chronic renal disease.  I have suggested obtaining a bone marrow biopsy to complete radiation as well as a hemolysis work-up.  She has deferred the option for a bone marrow biopsy at this point.  Given her age and comorbidities it is unclear that we will alter our management of supportive care.  Additional management choices such as growth factor support is less likely to help given her normal creatinine and we will update erythropoietin level  I have recommended periodic monitoring and transfusion as needed for the time being.  2.  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  Management options include avoiding triggers and dietary modification and supportive care is her best option in any case.  3.  Follow-up: We will be in the near future after completing her hematological work-up for further management and treatment choices.   45  minutes were dedicated to this visit. The time was spent on reviewing laboratory data, imaging studies, discussing treatment options, discussing differential diagnosis and answering questions regarding future plan.     A copy of this consult has been forwarded to the requesting physician.

## 2020-12-05 LAB — HAPTOGLOBIN: Haptoglobin: 163 mg/dL (ref 41–333)

## 2020-12-07 MED ORDER — LEVOTHYROXINE SODIUM 75 MCG PO TABS: 75.0000 ug | ORAL_TABLET | Freq: Every day | ORAL | 0 refills | Status: DC

## 2020-12-23 ENCOUNTER — Telehealth: Payer: Self-pay | Admitting: Family Medicine

## 2020-12-23 NOTE — Chronic Care Management (AMB) (Signed)
  Chronic Care Management   Note  12/23/2020 Name: LILYMARIE SCROGGINS MRN: [id number] DOB: [birth year]  JATOYA ARMBRISTER is a 86 y.o. year old female who is a primary care patient of Koberlein, Steele Berg, MD. I reached out to Deberah Castle by phone today in response to a referral sent by Ms. Rea College Kowalczyk's PCP, Caren Macadam, MD.   Ms. Cliburn was given information about Chronic Care Management services today including:  CCM service includes personalized support from designated clinical staff supervised by her physician, including individualized plan of care and coordination with other care providers 24/7 contact phone numbers for assistance for urgent and routine care needs. Service will only be billed when office clinical staff spend 20 minutes or more in a month to coordinate care. Only one practitioner may furnish and bill the service in a calendar month. The patient may stop CCM services at any time (effective at the end of the month) by phone call to the office staff.   Patient agreed to services and verbal consent obtained.   Follow up plan:   Tatjana Secretary/administrator

## 2020-12-24 ENCOUNTER — Other Ambulatory Visit: Payer: Self-pay | Admitting: Nurse Practitioner

## 2020-12-25 ENCOUNTER — Telehealth: Payer: Self-pay | Admitting: Pharmacist

## 2020-12-25 NOTE — Chronic Care Management (AMB) (Signed)
Chronic Care Management Pharmacy Assistant   Name: [name]  MRN: [id number] DOB: March 24, 1934  [name] is an 86 y.o. year old female who presents for her initial CCM visit with the clinical pharmacist.  Reason for Encounter: Chart prep for initial visit with Jeni Salles the clinical pharmacist on 12/28/2020   Conditions to be addressed/monitored: HTN, HLD, COPD, and Hypothyroidism  Recent office visits:  11/13/2020 Micheline Rough MD - Patient was seen for anemia and additional issues. Changed Acetaminophen from 650 mg to 200 mg at needed.  Discontinued Cholecalciferol. No follow up noted.  10/21/2020 Carolann Littler MD - Patient was seen for Iron deficiency anemia and an additional issue. No medication changes. No follow up noted.  08/14/2020 Micheline Rough MD - Patient was seen for essential hypertension and additional issues. Changed Fluticasone to 2 puffs twice daily, Decreased Levothyroxine to 50 mcg daily, Discontinued Ipatropium-Albuterol. Follow up in 3 months.  Recent consult visits:  12/04/2020 Zola Button MD (onconlogy) - Patient was seen for symptomatic anemia. No medication changes. No follow up noted.  Hospital visits:  [facility] DEPT on 11/02/2020 due to Chronic fatigue and Anemia. Discharge date was 11/03/2020 after 5 hours in ED.    Medication Changes at Hospital Discharge: -None?  -All other medications will remain the same.    Patient was admitted to [facility] on 10/13/2020 due to Symptomatic anemia. Patient was discharged on 10/16/2020  Medication Changes at Hospital Discharge: -None?  -All other medications will remain the same.   Patient was seen at [facility] on 06/29/2020 due to an acute urinary tract infection. - Medication changes: started Sulfamethoxazole-Trimethoprim 800-160 mg twice daily.  -All other medications will remain the same.   Medications: Outpatient Encounter  Medications as of 12/25/2020  Medication Sig   Acetaminophen (TYLENOL PO) Take 200 mg by mouth as needed.   Ferrous Sulfate (IRON) 325 (65 FE) MG TABS Take 1 tablet by mouth daily.    fluticasone (FLOVENT HFA) 110 MCG/ACT inhaler Take 2 puffs by mouth twice daily   levothyroxine (SYNTHROID) 75 MCG tablet Take 1 tablet (75 mcg total) by mouth daily.   mesalamine (LIALDA) 1.2 g EC tablet TAKE 2 TABLETS BY MOUTH DAILY WITH BREAKFAST.   Spacer/Aero-Holding Chambers (E-Z SPACER) inhaler Use as instructed   No facility-administered encounter medications on file as of 12/25/2020.   Fill History: FLOVENT HFA 110 MCG INHALER 11/22/2020 30   LEVOTHYROXINE 75 MCG TABLET 12/07/2020 90   MESALAMINE DR 1.2 GM TABLET 11/19/2020 90   Have you seen any other providers since your last visit? **no  Any changes in your medications or health? no  Any side effects from any medications? no  Do you have an symptoms or problems not managed by your medications? Yes, pain of the skin on lower legs  Any concerns about your health right now? no  Has your provider asked that you check blood pressure, blood sugar, or follow special diet at home? Yes, GI diet, no raw foods or meats  Do you get any type of exercise on a regular basis? Yes, house keeping  Can you think of a goal you would like to reach for your health? Yes, she would like to not have the leg pain or mouth pain.  Do you have any problems getting your medications? no  Is there anything that you would like to discuss during the appointment? No  Please bring medications and supplements to appointment  Care Gaps: AWV - scheduled 01/22/2021 Last BP - 144/73 on 12/04/2020 Shingrix - never done Pneumovax - overdue Flu - overdue  Star Rating Drugs: None  [place] [facility] Pharmacist Assistant [phone]

## 2020-12-28 ENCOUNTER — Ambulatory Visit (INDEPENDENT_AMBULATORY_CARE_PROVIDER_SITE_OTHER): Payer: Medicare Other | Admitting: Pharmacist

## 2020-12-28 VITALS — BP 119/68 | Temp 97.8°F

## 2020-12-28 DIAGNOSIS — E039 Hypothyroidism, unspecified: Secondary | ICD-10-CM

## 2020-12-28 DIAGNOSIS — J449 Chronic obstructive pulmonary disease, unspecified: Secondary | ICD-10-CM

## 2020-12-28 NOTE — Patient Instructions (Addendum)
Hi [name],  It was great to get to meet you over the telephone! Below is a summary of some of the topics we discussed.   Don't forget to switch your ibuprofen to acetaminophen or Tylenol to prevent bleeding!  Please reach out to me if you have any questions or need anything before I reach back out to you!  Best, Maddie  Jeni Salles, PharmD, [facility]   Visit Information   Goals Addressed   None    Patient Care Plan: CCM Pharmacy Care Plan     Problem Identified: Problem: Hypertension, GERD, COPD, Hypothyroidism, Osteoporosis, and UC      Long-Range Goal: Patient-Specific Goal   Start Date: 12/28/2020  Expected End Date: 12/28/2021  This Visit's Progress: On track  Priority: High  Note:   Current Barriers:  Unable to independently monitor therapeutic efficacy Unable to achieve control of TSH   Pharmacist Clinical Goal(s):  Patient will achieve adherence to monitoring guidelines and medication adherence to achieve therapeutic efficacy achieve control of thyroid as evidenced by TSH  through collaboration with PharmD and provider.   Interventions: 1:1 collaboration with Caren Macadam, MD regarding development and update of comprehensive plan of care as evidenced by provider attestation and co-signature Inter-disciplinary care team collaboration (see longitudinal plan of care) Comprehensive medication review performed; medication list updated in electronic medical record  Hypertension (BP goal <140/90) -Controlled -Current treatment: No medications -Medications previously tried: none  -Current home readings: uses an arm cuff every 1-2 weeks -Current dietary habits: has had low sodium in the past and eats saltines to increase intake -Current exercise habits: tries to be as active as possible -Reports hypotensive/hypertensive symptoms -Educated on Importance of home blood pressure monitoring; Proper  BP monitoring technique; -Counseled to monitor BP at home weekly, document, and provide log at future appointments -Counseled on diet and exercise extensively  COPD/asthma (Goal: control symptoms and prevent exacerbations) -Not ideally controlled -Current treatment  Flovent HFA 110 mcg/act 2 puffs twice daily -Medications previously tried: n/a  -Gold Grade: n/a -Current COPD Classification:  A (low sx, <2 exacerbations/yr) -MMRC/CAT score: n/a -Pulmonary function testing: could not locate -Exacerbations requiring treatment in last 6 months: none -Patient reports consistent use of maintenance inhaler -Frequency of rescue inhaler use: does not have one -Counseled on Proper inhaler technique; - Consider adding LAMA inhaler for COPD.  Osteoporosis / Osteopenia (Goal prevent fractures) -Uncontrolled -Last DEXA Scan: 2018   T-Score femoral neck: -1.8  T-Score total hip: n/a  T-Score lumbar spine: -0.5  T-Score forearm radius: n/a  10-year probability of major osteoporotic fracture: 14.1%  10-year probability of hip fracture: 4.1% -Patient is not a candidate for pharmacologic treatment -Current treatment  No medications -Medications previously tried: none  -Recommend [id number] units of vitamin D daily. Recommend 1200 mg of calcium daily from dietary and supplemental sources. -Collaborated with PCP about restarting calcium and vitamin D supplementation.  Ulcerative colitis (Goal: minimize symptoms) -Controlled -Current treatment  Mesalamine 1.2 g 2 tablets daily with breakfast -Medications previously tried: none  -Recommended to continue current medication  Hypothyroidism (Goal: 2.5-4.5) -Uncontrolled -Current treatment  Levothyroxine 75 mcg 1 tablet daily -Medications previously tried: none  -Recommended to continue current medication Counseled on separating from iron tablets by 4 hours.  Anemia (Goal: minimize bleeding) -Controlled -Current treatment  Ferrous sulfate 325  mg 1 tablet daily -Medications previously tried: none  -Recommended to continue current medication   Health Maintenance -Vaccine gaps: shingrix, Pneumovax,  influenza -Current therapy:  Ibuprofen 200 mg as needed - 5 tablets per day Tums as needed (couple times a week)  -Educated on Cost vs benefit of each product must be carefully weighed by individual consumer -Patient is satisfied with current therapy and denies issues -Recommended switching to acetaminophen or Tylenol due to risk for bleeding.  Patient Goals/Self-Care Activities Patient will:  - take medications as prescribed as evidenced by patient report and record review check blood pressure weekly and when symptomatic, document, and provide at future appointments  Follow Up Plan: The care management team will reach out to the patient again over the next 30 days.       Ms. Cheaney was given information about Chronic Care Management services today including:  CCM service includes personalized support from designated clinical staff supervised by her physician, including individualized plan of care and coordination with other care providers 24/7 contact phone numbers for assistance for urgent and routine care needs. Standard insurance, coinsurance, copays and deductibles apply for chronic care management only during months in which we provide at least 20 minutes of these services. Most insurances cover these services at 100%, however patients may be responsible for any copay, coinsurance and/or deductible if applicable. This service may help you avoid the need for more expensive face-to-face services. Only one practitioner may furnish and bill the service in a calendar month. The patient may stop CCM services at any time (effective at the end of the month) by phone call to the office staff.  Patient agreed to services and verbal consent obtained.   Patient verbalizes understanding of instructions provided today and agrees to view in  [place].  The pharmacy team will reach out to the patient again over the next 30 days.   Viona Gilmore, [facility]

## 2020-12-28 NOTE — Progress Notes (Signed)
Chronic Care Management Pharmacy Note  12/28/2020 Name:  [name] MRN:  [id number] DOB:  1934-07-22  Summary: BP is at goal < 140/90 per home readings TSH not at goal of 2.5-4.5   Recommendations/Changes made from today's visit: -Recommended switching ibuprofen to acetaminophen due to risk for bleeding/history of anemia -Recommend restarting vitamin D 1000 units daily -Consider repeat DEXA  Plan: Follow up COPD/asthma assessment in 2-3 months   Subjective: [name] is an 86 y.o. year old female who is a primary patient of Koberlein, Steele Berg, MD.  The CCM team was consulted for assistance with disease management and care coordination needs.    Engaged with patient by telephone for initial visit in response to provider referral for pharmacy case management and/or care coordination services.   Consent to Services:  The patient was given the following information about Chronic Care Management services today, agreed to services, and gave verbal consent: 1. CCM service includes personalized support from designated clinical staff supervised by the primary care provider, including individualized plan of care and coordination with other care providers 2. 24/7 contact phone numbers for assistance for urgent and routine care needs. 3. Service will only be billed when office clinical staff spend 20 minutes or more in a month to coordinate care. 4. Only one practitioner may furnish and bill the service in a calendar month. 5.The patient may stop CCM services at any time (effective at the end of the month) by phone call to the office staff. 6. The patient will be responsible for cost sharing (co-pay) of up to 20% of the service fee (after annual deductible is met). Patient agreed to services and consent obtained.  Patient Care Team: Caren Macadam, MD as PCP - General (Family Medicine) Jari Pigg, MD as Attending Physician (Dermatology) Clent Jacks, MD (Ophthalmology) Viona Gilmore, [facility] as Pharmacist (Pharmacist)  Recent office visits: 11/13/2020 Micheline Rough MD - Patient was seen for anemia and additional issues. Changed Acetaminophen from 650 mg to 200 mg at needed.  Discontinued Cholecalciferol. No follow up noted.   10/21/2020 Carolann Littler MD - Patient was seen for Iron deficiency anemia and an additional issue. No medication changes. No follow up noted.   08/14/2020 Micheline Rough MD - Patient was seen for essential hypertension and additional issues. Changed Fluticasone to 2 puffs twice daily, Decreased Levothyroxine to 50 mcg daily, Discontinued Ipatropium-Albuterol. Follow up in 3 months.  Recent consult visits: 12/04/2020 Zola Button MD (onconlogy) - Patient was seen for symptomatic anemia. No medication changes. No follow up noted.  Hospital visits: [facility] DEPT on 11/02/2020 due to Chronic fatigue and Anemia. Discharge date was 11/03/2020 after 5 hours in ED.    Medication Changes at Hospital Discharge: -None?  -All other medications will remain the same.     Patient was admitted to [facility] on 10/13/2020 due to Symptomatic anemia. Patient was discharged on 10/16/2020  Medication Changes at Hospital Discharge: -None?  -All other medications will remain the same.    Patient was [facility] on 06/29/2020 due to an acute urinary tract infection. - Medication changes: started Sulfamethoxazole-Trimethoprim 800-160 mg twice daily.  -All other medications will remain the same.    Objective:  Lab Results  Component Value Date   CREATININE 0.67 11/02/2020   BUN 25 (H) 11/02/2020   GFR 93.60 07/31/2019   GFRNONAA >60 11/02/2020   GFRAA >60 08/09/2019   NA 135 11/02/2020  K 4.2 11/02/2020   CALCIUM 9.3 11/02/2020   CO2 27 11/02/2020   GLUCOSE 114 (H) 11/02/2020    Lab Results  Component Value Date/Time   HGBA1C 5.6 06/21/2013 11:45 AM   GFR 93.60  07/31/2019 02:18 PM   GFR 78.58 07/03/2019 02:22 PM    Last diabetic Eye exam: No results found for: HMDIABEYEEXA  Last diabetic Foot exam: No results found for: HMDIABFOOTEX   Lab Results  Component Value Date   CHOL 170 12/11/2019   HDL 56 12/11/2019   LDLCALC 87 12/11/2019   LDLDIRECT 120.8 06/05/2009   TRIG 178 (H) 12/11/2019   CHOLHDL 3.0 12/11/2019    Hepatic Function Latest Ref Rng & Units 11/02/2020 10/13/2020 08/14/2020  Total Protein 6.5 - 8.1 g/dL 7.0 6.2(L) 7.0  Albumin 3.5 - 5.0 g/dL 3.9 3.8 -  AST 15 - 41 U/L 24 17 17   ALT 0 - 44 U/L 13 9 8   Alk Phosphatase 38 - 126 U/L 43 39 -  Total Bilirubin 0.3 - 1.2 mg/dL 0.4 0.2(L) 0.3  Bilirubin, Direct 0.0 - 0.3 mg/dL - - -    Lab Results  Component Value Date/Time   TSH 5.436 (H) 12/04/2020 11:29 AM   TSH 6.320 (H) 11/02/2020 04:25 PM   TSH 0.11 (L) 08/14/2020 04:18 PM   TSH 2.81 12/11/2019 10:51 AM   FREET4 0.86 11/02/2020 04:25 PM    CBC Latest Ref Rng & Units 12/04/2020 11/02/2020 10/21/2020  WBC 4.0 - 10.5 K/uL 6.9 6.4 6.3  Hemoglobin 12.0 - 15.0 g/dL 10.0(L) 8.4(L) 9.3(L)  Hematocrit 36.0 - 46.0 % 30.7(L) 26.5(L) 27.7(L)  Platelets 150 - 400 K/uL 368 342 360.0    Lab Results  Component Value Date/Time   VD25OH 31 08/14/2020 04:18 PM   VD25OH 50.44 07/31/2019 02:18 PM   VD25OH 21.66 (L) 04/30/2019 02:49 PM    Clinical ASCVD: No  The ASCVD Risk score (Arnett DK, et al., 2019) failed to calculate for the following reasons:   The 2019 ASCVD risk score is only valid for ages 42 to 16    Depression screen PHQ 2/9 01/22/2020 01/19/2017 09/14/2015  Decreased Interest 0 0 0  Down, Depressed, Hopeless 0 0 0  PHQ - 2 Score 0 0 0  Altered sleeping 0 - -  Tired, decreased energy 0 - -  Change in appetite 0 - -  Feeling bad or failure about yourself  0 - -  Trouble concentrating 0 - -  Moving slowly or fidgety/restless 0 - -  Suicidal thoughts 0 - -  PHQ-9 Score 0 - -  Difficult doing work/chores Not difficult at  all - -  Some recent data might be hidden    No flowsheet data found.  No flowsheet data found.    Social History   Tobacco Use  Smoking Status Never  Smokeless Tobacco Never   BP Readings from Last 3 Encounters:  12/28/20 119/68  12/04/20 (!) 144/73  11/13/20 118/70   Pulse Readings from Last 3 Encounters:  12/04/20 92  11/13/20 83  11/02/20 78   Wt Readings from Last 3 Encounters:  12/04/20 147 lb 1 oz (66.7 kg)  11/13/20 148 lb 8 oz (67.4 kg)  11/02/20 141 lb (64 kg)   BMI Readings from Last 3 Encounters:  12/04/20 22.36 kg/m  11/13/20 22.58 kg/m  11/02/20 21.44 kg/m    Assessment/Interventions: Review of patient past medical history, allergies, medications, health status, including review of consultants reports, laboratory and other test data, was  performed as part of comprehensive evaluation and provision of chronic care management services.   SDOH:  (Social Determinants of Health) assessments and interventions performed: Yes SDOH Interventions    Flowsheet Row Most Recent Value  SDOH Interventions   Financial Strain Interventions Intervention Not Indicated  Transportation Interventions Intervention Not Indicated      SDOH Screenings   Alcohol Screen: Low Risk    Last Alcohol Screening Score (AUDIT): 0  Depression (PHQ2-9): Low Risk    PHQ-2 Score: 0  Financial Resource Strain: Low Risk    Difficulty of Paying Living Expenses: Not very hard  Food Insecurity: No Food Insecurity   Worried About Charity fundraiser in the Last Year: Never true   Ran Out of Food in the Last Year: Never true  Housing: Low Risk    Last Housing Risk Score: 0  Physical Activity: Inactive   Days of Exercise per Week: 0 days   Minutes of Exercise per Session: 0 min  Social Connections: Moderately Isolated   Frequency of Communication with Friends and Family: More than three times a week   Frequency of Social Gatherings with Friends and Family: Three times a week    Attends Religious Services: More than 4 times per year   Active Member of Clubs or Organizations: No   Attends Archivist Meetings: Never   Marital Status: Widowed  Stress: No Stress Concern Present   Feeling of Stress : Only a little  Tobacco Use: Low Risk    Smoking Tobacco Use: Never   Smokeless Tobacco Use: Never   Passive Exposure: Not on file  Transportation Needs: No Transportation Needs   Lack of Transportation (Medical): No   Lack of Transportation (Non-Medical): No   Patient's quality of life has gone down since COVID that she had in January. Patient used to be active but is no longer. Patient sleeps until 7 or 730.   Patient has a sore mouth right now and isn't really following up with a dentist right now. Her feet and lower legs are painful and this bothers her constantly. Patient has had this ongoing for 2-3 years. This pain can keep her up at night and the gel packs can give her some relief.  Patient eats the same breakfast every day because of her stomach bleeding. Patient has been on a strict diet for 5 years. Patient eats a lot of what she can eat. She only eats jars of beef and fish filet. She cannot eat raw foods and cannot eat acidic foods.  Patient lives on her own in an apartment in a senior living facility. She cannot do heavy work like she did a year ago prior to [place]. She does her own laundry and cooking. She does enjoy reading and talking on the phone to 2-3 friends several days a week. Patient is usually exhausted by 5pm and   Patient is trying to do physical therapy every day and rotator cuff exercises every day. Patient uses a cane when she feels unsteady.  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 Patient is trying to stay as active as possible.  Patient's son lives nearby with his family and he checks in on her every day. Patient no longer drives anymore. Patient has one son that died 37 years ago  and 1 son that has really bad kidneys and he tries to visit some.  CCM Care Plan  Allergies  Allergen Reactions  [place] Nut Daisey Must) Skin Test Anaphylaxis   Fluconazole Shortness Of Breath   Metronidazole Shortness Of Breath and Nausea And Vomiting   Pepcid [Famotidine] Shortness Of Breath and Other (See Comments)    Dizziness   Shellfish Allergy Anaphylaxis   Macrobid [Nitrofurantoin]     REACTION: Syncope   Pantoprazole Other (See Comments)    CHEST PAIN    Medications Reviewed Today     Reviewed by Viona Gilmore, [facility] (Pharmacist) on 12/28/20 at 1120  Med List Status: <None>   Medication Order Taking? Sig Documenting Provider Last Dose Status Informant  Ferrous Sulfate (IRON) 325 (65 FE) MG TABS [id number] Yes Take 1 tablet by mouth daily.  [provider] Taking Active Self  fluticasone (FLOVENT HFA) 110 MCG/ACT inhaler [id number] Yes Take 2 puffs by mouth twice daily Koberlein, Junell C, MD Taking Active Self  ibuprofen (ADVIL) 200 MG tablet [id number] Yes Take 200 mg by mouth every 6 (six) hours as needed. [provider] Taking Active   levothyroxine (SYNTHROID) 75 MCG tablet [id number] Yes Take 1 tablet (75 mcg total) by mouth daily. Caren Macadam, MD Taking Active   mesalamine (LIALDA) 1.2 g EC tablet [id number] Yes TAKE 2 TABLETS BY MOUTH DAILY WITH BREAKFAST. Willia Craze, NP Taking Active Self  Spacer/Aero-Holding Chambers (E-Z SPACER) inhaler [id number]  Use as instructed Caren Macadam, MD  Active Self            Patient Active Problem List   Diagnosis Date Noted   Symptomatic anemia 10/13/2020   GIB (gastrointestinal bleeding) 03/11/2020   Chronic ulcerative rectosigmoiditis with rectal bleeding (HCC)    Abnormal CT scan, gastrointestinal tract    LLQ abdominal pain 10/31/2019   Diverticulitis 10/31/2019   UTI (urinary tract infection) 08/06/2019   COPD (chronic obstructive pulmonary disease) (HCC)    Ulcerative  colitis ([employer])    Vitamin D deficiency 05/01/2019   Arthralgia 08/21/2017   Osteoporosis 11/21/2016   Postmenopausal status 09/06/2016   Other nonspecific abnormal finding of lung field 08/14/2015   Hyponatremia 07/21/2014   Menopausal state 07/18/2014   Hematest positive stools 08/05/2013   Anemia, blood loss 08/05/2013   Cerumen impaction 06/03/2013   History of adrenal disorder 10/15/2012   Rash and nonspecific skin eruption 10/15/2012   Iron deficiency anemia 06/20/2012   Psychogenic polydipsia 06/20/2012   Nonspecific abnormal electrocardiogram (ECG) (EKG) 06/20/2012   Polyuria 05/02/2012   Pelvic pain in female 08/16/2011   Leg ulcer ([place]) 06/16/2010   Routine general medical examination at a health care facility 06/11/2010   Encounter for long-term (current) use of other medications 06/11/2010   HYPERCHOLESTEROLEMIA 06/05/2009   UNSPECIFIED URINARY CALCULUS 05/28/2008   CHEST PAIN, ATYPICAL 05/28/2008   ABDOMINAL PAIN, CHRONIC 05/28/2008   ASYMPTOMATIC POSTMENOPAUSAL STATUS 05/28/2008   SOMATIZATION DISORDER 04/25/2007   Hypothyroidism 09/21/2006   Essential hypertension 09/21/2006   ALLERGIC RHINITIS 09/21/2006   ASTHMA 09/21/2006   GERD 09/21/2006   Disorder resulting from impaired renal function 09/21/2006   DIVERTICULITIS, HX OF 09/21/2006    Immunization History  Administered Date(s) Administered   Fluad Quad(high Dose 65+) 11/23/2018   Influenza Whole 12/08/2008   Influenza, High Dose Seasonal PF 10/22/2013, 11/12/2016   Influenza,inj,Quad PF,6+ Mos 11/08/2017   Influenza-Unspecified 11/23/2018   Pneumococcal Polysaccharide-23 05/09/2002, 07/18/2014   Td 05/09/2003   Tdap 08/15/2016   Zoster, Live 06/21/2013    Conditions to be addressed/monitored:  Hypertension, GERD, COPD, Hypothyroidism, Osteoporosis, and UC  Care Plan : CCM  Pharmacy Care Plan  Updates made by Viona Gilmore, [place] since 12/28/2020 12:00 AM     Problem: Problem: Hypertension,  GERD, COPD, Hypothyroidism, Osteoporosis, and UC      Long-Range Goal: Patient-Specific Goal   Start Date: 12/28/2020  Expected End Date: 12/28/2021  This Visit's Progress: On track  Priority: High  Note:   Current Barriers:  Unable to independently monitor therapeutic efficacy Unable to achieve control of TSH   Pharmacist Clinical Goal(s):  Patient will achieve adherence to monitoring guidelines and medication adherence to achieve therapeutic efficacy achieve control of thyroid as evidenced by TSH  through collaboration with PharmD and provider.   Interventions: 1:1 collaboration with Caren Macadam, MD regarding development and update of comprehensive plan of care as evidenced by provider attestation and co-signature Inter-disciplinary care team collaboration (see longitudinal plan of care) Comprehensive medication review performed; medication list updated in electronic medical record  Hypertension (BP goal <140/90) -Controlled -Current treatment: No medications -Medications previously tried: none  -Current home readings: uses an arm cuff every 1-2 weeks -Current dietary habits: has had low sodium in the past and eats saltines to increase intake -Current exercise habits: tries to be as active as possible -Reports hypotensive/hypertensive symptoms -Educated on Importance of home blood pressure monitoring; Proper BP monitoring technique; -Counseled to monitor BP at home weekly, document, and provide log at future appointments -Counseled on diet and exercise extensively  COPD/asthma (Goal: control symptoms and prevent exacerbations) -Not ideally controlled -Current treatment  Flovent HFA 110 mcg/act 2 puffs twice daily -Medications previously tried: n/a  -Gold Grade: n/a -Current COPD Classification:  A (low sx, <2 exacerbations/yr) -MMRC/CAT score: n/a -Pulmonary function testing: could not locate -Exacerbations requiring treatment in last 6 months: none -Patient  reports consistent use of maintenance inhaler -Frequency of rescue inhaler use: does not have one -Counseled on Proper inhaler technique; - Consider adding LAMA inhaler for COPD.  Osteoporosis / Osteopenia (Goal prevent fractures) -Uncontrolled -Last DEXA Scan: 2018   T-Score femoral neck: -1.8  T-Score total hip: n/a  T-Score lumbar spine: -0.5  T-Score forearm radius: n/a  10-year probability of major osteoporotic fracture: 14.1%  10-year probability of hip fracture: 4.1% -Patient is not a candidate for pharmacologic treatment -Current treatment  No medications -Medications previously tried: none  -Recommend [phone] units of vitamin D daily. Recommend 1200 mg of calcium daily from dietary and supplemental sources. -Collaborated with PCP about restarting calcium and vitamin D supplementation.  Ulcerative colitis (Goal: minimize symptoms) -Controlled -Current treatment  Mesalamine 1.2 g 2 tablets daily with breakfast -Medications previously tried: none  -Recommended to continue current medication  Hypothyroidism (Goal: 2.5-4.5) -Uncontrolled -Current treatment  Levothyroxine 75 mcg 1 tablet daily -Medications previously tried: none  -Recommended to continue current medication Counseled on separating from iron tablets by 4 hours.  Anemia (Goal: minimize bleeding) -Controlled -Current treatment  Ferrous sulfate 325 mg 1 tablet daily -Medications previously tried: none  -Recommended to continue current medication   Health Maintenance -Vaccine gaps: shingrix, Pneumovax, influenza -Current therapy:  Ibuprofen 200 mg as needed - 5 tablets per day Tums as needed (couple times a week)  -Educated on Cost vs benefit of each product must be carefully weighed by individual consumer -Patient is satisfied with current therapy and denies issues -Recommended switching to acetaminophen or Tylenol due to risk for bleeding.  Patient Goals/Self-Care Activities Patient will:  -  take medications as prescribed as evidenced by patient report and record review check blood pressure weekly and  when symptomatic, document, and provide at future appointments  Follow Up Plan: The care management team will reach out to the patient again over the next 30 days.       Medication Assistance: None required.  Patient affirms current coverage meets needs.  Compliance/Adherence/Medication fill history: Care Gaps: Shingrix, pneumovax, influenza Last BP - 144/73 on 12/04/2020  Star-Rating Drugs: None  Patient's preferred pharmacy is:  [id number] [place] [id number]: [phone]: [phone] Uses pill box? No - keeps them in the bottle Pt endorses 99% compliance  We discussed: Current pharmacy is preferred with insurance plan and patient is satisfied with pharmacy services Patient decided to: Continue current medication management strategy  Care Plan and Follow Up Patient Decision:  Patient agrees to Care Plan and Follow-up.  Plan: The care management team will reach out to the patient again over the next 30 days.  MJeni Salles PharmD, BRandallPharmacist [place]at [place]St[place]

## 2021-01-06 DIAGNOSIS — J449 Chronic obstructive pulmonary disease, unspecified: Secondary | ICD-10-CM | POA: Diagnosis not present

## 2021-01-06 DIAGNOSIS — E039 Hypothyroidism, unspecified: Secondary | ICD-10-CM | POA: Diagnosis not present

## 2021-01-13 ENCOUNTER — Telehealth: Payer: Self-pay | Admitting: Oncology

## 2021-01-13 NOTE — Telephone Encounter (Signed)
Scheduled per sch msg. Called and left msg  

## 2021-01-18 ENCOUNTER — Inpatient Hospital Stay: Payer: Medicare Other

## 2021-01-18 ENCOUNTER — Inpatient Hospital Stay: Payer: Medicare Other | Admitting: Oncology

## 2021-01-19 ENCOUNTER — Other Ambulatory Visit: Payer: Self-pay

## 2021-01-19 ENCOUNTER — Inpatient Hospital Stay (HOSPITAL_BASED_OUTPATIENT_CLINIC_OR_DEPARTMENT_OTHER): Payer: Medicare Other | Admitting: Oncology

## 2021-01-19 ENCOUNTER — Inpatient Hospital Stay: Payer: Medicare Other | Attending: Hematology and Oncology

## 2021-01-19 VITALS — BP 140/74 | HR 81 | Temp 97.7°F | Resp 16 | Ht 68.0 in | Wt 146.3 lb

## 2021-01-19 DIAGNOSIS — Z79899 Other long term (current) drug therapy: Secondary | ICD-10-CM | POA: Insufficient documentation

## 2021-01-19 DIAGNOSIS — D649 Anemia, unspecified: Secondary | ICD-10-CM

## 2021-01-19 LAB — CBC WITH DIFFERENTIAL (CANCER CENTER ONLY)
Abs Immature Granulocytes: 0.02 10*3/uL (ref 0.00–0.07)
Basophils Absolute: 0.1 10*3/uL (ref 0.0–0.1)
Basophils Relative: 1 %
Eosinophils Absolute: 0 10*3/uL (ref 0.0–0.5)
Eosinophils Relative: 0 %
HCT: 31.8 % — ABNORMAL LOW (ref 36.0–46.0)
Hemoglobin: 10.7 g/dL — ABNORMAL LOW (ref 12.0–15.0)
Immature Granulocytes: 0 %
Lymphocytes Relative: 19 %
Lymphs Abs: 1.4 10*3/uL (ref 0.7–4.0)
MCH: 31.7 pg (ref 26.0–34.0)
MCHC: 33.6 g/dL (ref 30.0–36.0)
MCV: 94.1 fL (ref 80.0–100.0)
Monocytes Absolute: 1 10*3/uL (ref 0.1–1.0)
Monocytes Relative: 14 %
Neutro Abs: 4.6 10*3/uL (ref 1.7–7.7)
Neutrophils Relative %: 66 %
Platelet Count: 373 10*3/uL (ref 150–400)
RBC: 3.38 MIL/uL — ABNORMAL LOW (ref 3.87–5.11)
RDW: 12.7 % (ref 11.5–15.5)
WBC Count: 7.1 10*3/uL (ref 4.0–10.5)
nRBC: 0 % (ref 0.0–0.2)

## 2021-01-19 LAB — VITAMIN B12: Vitamin B-12: 489 pg/mL (ref 180–914)

## 2021-01-19 LAB — SAMPLE TO BLOOD BANK

## 2021-01-19 LAB — IRON AND TIBC
Iron: 48 ug/dL (ref 41–142)
Saturation Ratios: 16 % — ABNORMAL LOW (ref 21–57)
TIBC: 303 ug/dL (ref 236–444)
UIBC: 254 ug/dL (ref 120–384)

## 2021-01-19 LAB — FERRITIN: Ferritin: 38 ng/mL (ref 11–307)

## 2021-01-19 NOTE — Progress Notes (Signed)
Hematology and Oncology Follow Up Visit  [name] [id number] [birth year] 86 y.o. 01/19/2021 11:24 AM Caren Macadam, MDKoberlein, Steele Berg, MD   Principle Diagnosis: 86 year old woman with multifactorial anemia exacerbated in September 2022 related to colitis.   Prior Therapy:  She is status post packed red cell transfusion and oral iron replacement.  Current therapy: Oral iron replacement only.  Interim History: [name] returns today for a follow-up visit.  Since the last visit, she reports no major changes in her health.  She continues to be quite frail although ambulating with the help of a cane.  She denies any recent falls or syncope.  She is limited in her ambulation but still lives independently.  She does report chronic arthritic pain which is not changed at this time.     Medications: I have reviewed the patient's current medications.  Current Outpatient Medications  Medication Sig Dispense Refill   Ferrous Sulfate (IRON) 325 (65 FE) MG TABS Take 1 tablet by mouth daily.      fluticasone (FLOVENT HFA) 110 MCG/ACT inhaler Take 2 puffs by mouth twice daily 12 g 11   ibuprofen (ADVIL) 200 MG tablet Take 200 mg by mouth every 6 (six) hours as needed.     levothyroxine (SYNTHROID) 75 MCG tablet Take 1 tablet (75 mcg total) by mouth daily. 90 tablet 0   mesalamine (LIALDA) 1.2 g EC tablet TAKE 2 TABLETS BY MOUTH DAILY WITH BREAKFAST. 180 tablet 1   Spacer/Aero-Holding Chambers (E-Z SPACER) inhaler Use as instructed 1 each 2   No current facility-administered medications for this visit.     Allergies:  Allergies  Allergen Reactions   [place] Nut Daisey Must) Skin Test Anaphylaxis   Fluconazole Shortness Of Breath   Metronidazole Shortness Of Breath and Nausea And Vomiting   Pepcid [Famotidine] Shortness Of Breath and Other (See Comments)    Dizziness   Shellfish Allergy Anaphylaxis   Macrobid [Nitrofurantoin]     REACTION: Syncope   Pantoprazole Other  (See Comments)    CHEST PAIN    Physical Exam:  ECOG: 2   General appearance: Comfortable appearing without any discomfort Head: Normocephalic without any trauma Oropharynx: Mucous membranes are moist and pink without any thrush or ulcers. Eyes: Pupils are equal and round reactive to light. Lymph nodes: No cervical, supraclavicular, inguinal or axillary lymphadenopathy.   Heart:regular rate and rhythm.  S1 and S2 without leg edema. Lung: Clear without any rhonchi or wheezes.  No dullness to percussion. Abdomin: Soft, nontender, nondistended with good bowel sounds.  No hepatosplenomegaly. Musculoskeletal: No joint deformity or effusion.  Full range of motion noted. Neurological: No deficits noted on motor, sensory and deep tendon reflex exam. Skin: No petechial rash or dryness.  Appeared moist.      Lab Results: Lab Results  Component Value Date   WBC 6.9 12/04/2020   HGB 10.0 (L) 12/04/2020   HCT 30.7 (L) 12/04/2020   MCV 97.8 12/04/2020   PLT 368 12/04/2020     Chemistry      Component Value Date/Time   NA 135 11/02/2020 1625   K 4.2 11/02/2020 1625   CL 103 11/02/2020 1625   CO2 27 11/02/2020 1625   BUN 25 (H) 11/02/2020 1625   CREATININE 0.67 11/02/2020 1625   CREATININE 0.64 08/14/2020 1618      Component Value Date/Time   CALCIUM 9.3 11/02/2020 1625   CALCIUM 10.6 (H) 06/06/2009 0142   ALKPHOS 43 11/02/2020 1625   AST 24 11/02/2020  1625   ALT 13 11/02/2020 1625   BILITOT 0.4 11/02/2020 1625        Impression and Plan:   86 year old woman with:  1.  Multifactorial anemia documented recently in September 2022.  Laboratory data from today reviewed and showed an adequate hemoglobin is currently at 10.7 without any further decline.  Her anemia is predominantly related to blood loss and chronic iron absorption difficulties related to colitis.   At this time I recommended no additional intervention and continued monitoring for the time being.  We will  update her laboratory testing in 4 to 6 months.   2.  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  I do not have any further recommendations regarding this issue.   3.  Follow-up: In 6 months for repeat follow-up.     30  minutes were spent on this encounter.  Time was dedicated to reviewing her disease status, reviewing laboratory data and outlining future plan of care discussion.     Zola Button, MD 12/13/202211:24 AM

## 2021-01-22 ENCOUNTER — Ambulatory Visit (INDEPENDENT_AMBULATORY_CARE_PROVIDER_SITE_OTHER): Payer: Medicare Other

## 2021-01-22 VITALS — BP 118/63 | Ht 68.0 in | Wt 146.0 lb

## 2021-01-22 DIAGNOSIS — Z0000 Encounter for general adult medical examination without abnormal findings: Secondary | ICD-10-CM

## 2021-01-22 NOTE — Progress Notes (Addendum)
Subjective:   [name] is a 86 y.o. female who presents for Medicare Annual (Subsequent) preventive examination.  Review of Systems    No Ros Cardiac Risk Factors include: advanced age (>88mn, >>53women);hypertension    Objective:    Today's Vitals   01/22/21 1114  BP: 118/63  Weight: 146 lb (66.2 kg)  Height: 5' 8"  (1.727 m)   Body mass index is 22.2 kg/m.  Advanced Directives 01/22/2021 11/02/2020 10/13/2020 06/29/2020 04/28/2020 03/12/2020 03/11/2020  Does Patient Have a Medical Advance Directive? Yes Yes No No Yes - Yes  Type of AParamedicof ARavineLiving will HCassLiving will - - - HSpecial educational needs teacherof AEdinburgLiving will  Does patient want to make changes to medical advance directive? No - Patient declined - - - - No - Patient declined -  Copy of [place]in Chart? No - copy requested No - copy requested - - - No - copy requested No - copy requested  Would patient like information on creating a medical advance directive? - - No - Patient declined No - Patient declined No - Patient declined - -    Current Medications (verified) Outpatient Encounter Medications as of 01/22/2021  Medication Sig   Ferrous Sulfate (IRON) 325 (65 FE) MG TABS Take 1 tablet by mouth daily.    fluticasone (FLOVENT HFA) 110 MCG/ACT inhaler Take 2 puffs by mouth twice daily   ibuprofen (ADVIL) 200 MG tablet Take 200 mg by mouth every 6 (six) hours as needed.   levothyroxine (SYNTHROID) 75 MCG tablet Take 1 tablet (75 mcg total) by mouth daily.   mesalamine (LIALDA) 1.2 g EC tablet TAKE 2 TABLETS BY MOUTH DAILY WITH BREAKFAST.   Spacer/Aero-Holding Chambers (E-Z SPACER) inhaler Use as instructed   No facility-administered encounter medications on file as of 01/22/2021.    Allergies (verified) BBolivianut (berthollefia excelsa) skin test, Fluconazole, Metronidazole, Pepcid [famotidine], Shellfish  allergy, Macrobid [nitrofurantoin], and Pantoprazole   History: Past Medical History:  Diagnosis Date   ABDOMINAL PAIN, CHRONIC 05/28/2008   ALLERGIC RHINITIS 09/21/2006   ANEMIA-NOS 09/21/2006   ASTHMA 09/21/2006   ASYMPTOMATIC POSTMENOPAUSAL STATUS 05/28/2008   Cataract    CHEST PAIN, ATYPICAL 05/28/2008   COPD (chronic obstructive pulmonary disease) ([facility]    COVID-19 virus infection 02/2020   DDD (degenerative disc disease)    DIVERTICULITIS, HX OF 09/21/2006   GERD 09/21/2006   HYPERCHOLESTEROLEMIA 06/05/2009   HYPERTENSION 09/21/2006   HYPOTHYROIDISM 09/21/2006   Peptic ulcer disease    Purpura ([facility]    RENAL INSUFFICIENCY 09/21/2006   Shingles    Small bowel obstruction ([facility]    Somatization disorder 04/25/2007   UNSPECIFIED URINARY CALCULUS 05/28/2008   UTI (urinary tract infection) 07/2019   Past Surgical History:  Procedure Laterality Date   ABDOMINAL HYSTERECTOMY  1981   acute Nephritis  [place]  BIOPSY  11/18/2019   Procedure: BIOPSY;  Surgeon: SLadene Artist MD;  Location: [facility];  Service: Endoscopy;;   [place] 11/18/2019   Procedure: FLEXIBLE SIGMOIDOSCOPY;  Surgeon: SLadene Artist MD;  Location: [facility];  Service: Endoscopy;  Laterality: N/A;   NASAL SINUS SURGERY  1967   Pulmonary thrombosis     TONSILLECTOMY     TUBAL LIGATION  Family History  Problem Relation Age of Onset   Polycystic kidney disease Mother    Pancreatic cancer Mother    Other Father        Schamberg disease   Bladder Cancer Father    Hypertension Father    Marfan syndrome Son    Hemochromatosis Son    Cirrhosis Son    Allergic rhinitis Sister    Other Brother        bone issue as child; multiple fractures but seemed to age out of this   Hemochromatosis Cousin    Arthritis Sister    Colon cancer Neg Hx    Esophageal cancer Neg Hx    Stomach  cancer Neg Hx    Social History   Socioeconomic History   Marital status: Divorced    Spouse name: Not on file   Number of children: 3   Years of education: Not on file   Highest education level: Not on file  Occupational History   Occupation: retired  Tobacco Use   Smoking status: Never   Smokeless tobacco: Never  Vaping Use   Vaping Use: Never used  Substance and Sexual Activity   Alcohol use: No   Drug use: No   Sexual activity: Not Currently  Other Topics Concern   Not on file  Social History Narrative   Not on file   Social Determinants of Health   Financial Resource Strain: Low Risk    Difficulty of Paying Living Expenses: Not hard at all  Food Insecurity: No Food Insecurity   Worried About Charity fundraiser in the Last Year: Never true   [place] in the Last Year: Never true  Transportation Needs: No Transportation Needs   Lack of Transportation (Medical): No   Lack of Transportation (Non-Medical): No  Physical Activity: Sufficiently Active   Days of Exercise per Week: 7 days   Minutes of Exercise per Session: 30 min  Stress: No Stress Concern Present   Feeling of Stress : Not at all  Social Connections: Moderately Integrated   Frequency of Communication with Friends and Family: More than three times a week   Frequency of Social Gatherings with Friends and Family: More than three times a week   Attends Religious Services: 1 to 4 times per year   Active Member of [employer] or Organizations: Yes   Attends Archivist Meetings: 1 to 4 times per year   Marital Status: Divorced     Clinical Intake:  Pre-visit preparation completed: Yes Diabetic? No  Interpreter Needed?: No Activities of Daily Living In your present state of health, do you have any difficulty performing the following activities: 01/22/2021 10/13/2020  Hearing? N N  Vision? N N  Comment Patient has Implants -  Difficulty concentrating or making decisions? N N  Walking or  climbing stairs? N N  Dressing or bathing? N N  Doing errands, shopping? N N  Comment Son Assist -  Conservation officer, nature and eating ? N -  Using the Toilet? N -  In the past six months, have you accidently leaked urine? N -  Do you have problems with loss of bowel control? N -  Managing your Medications? N -  Managing your Finances? N -  Housekeeping or managing your Housekeeping? N -  Some recent data might be hidden    Patient Care Team: Caren Macadam, MD as PCP - General (Family Medicine) Jari Pigg, MD as Attending Physician (Dermatology) Clent Jacks, MD (  Ophthalmology) Viona Gilmore, [facility] as Pharmacist (Pharmacist)  Indicate any recent Medical Services you may have received from other than [facility] providers in the past year (date may be approximate).     Assessment:   This is a routine wellness examination for [place].  Virtual Visit via Telephone Note  I connected with  [name] on 01/22/21 at 11:15 AM EST by telephone and verified that I am speaking with the correct person using two identifiers.  Location: Patient: Home Provider: Office Persons participating in the virtual visit: patient/Nurse Health Advisor   I discussed the limitations, risks, security and privacy concerns of performing an evaluation and management service by telephone and the availability of in person appointments. The patient expressed understanding and agreed to proceed.  Interactive audio and video telecommunications were attempted between this nurse and patient, however failed, due to patient having technical difficulties OR patient did not have access to video capability.  We continued and completed visit with audio only.  Some vital signs may be absent or patient reported.   Criselda Peaches, LPN   Hearing/Vision screen Hearing Screening - Comments:: No difficulty hraring Vision Screening - Comments:: Patient has Implants.Followed by  Dr Katy Fitch  Dietary issues and exercise activities  discussed: Current Exercise Habits: Home exercise routine, Type of exercise: walking, Time (Minutes): 30, Frequency (Times/Week): 5, Weekly Exercise (Minutes/Week): 150, Intensity: Mild   Goals Addressed             This Visit's Progress    Exercise 3x per week (30 min per time)       Maintain current lifestyle/ Walking daily.       Depression Screen PHQ 2/9 Scores 01/22/2021 01/22/2020 01/19/2017 09/14/2015 01/12/2015 11/03/2014  PHQ - 2 Score 0 0 0 0 0 0  PHQ- 9 Score - 0 - - - -    Fall Risk Fall Risk  01/22/2021 08/14/2020 01/22/2020 04/30/2019 01/19/2017  Falls in the past year? 0 0 1 1 No  Comment - - - 01/27/2019 -  Number falls in past yr: 0 - 1 0 -  Injury with Fall? 0 - 1 1 -  Risk for fall due to : - - History of fall(s);Impaired balance/gait;Impaired mobility - -  Follow up - - Falls evaluation completed;Falls prevention discussed - -    FALL RISK PREVENTION PERTAINING TO THE HOME:  Any stairs in or around the home? No  If so, are there any without handrails? No  Home free of loose throw rugs in walkways, pet beds, electrical cords, etc? Yes  Adequate lighting in your home to reduce risk of falls? Yes   ASSISTIVE DEVICES UTILIZED TO PREVENT FALLS:  Life alert? Yes  Use of a cane, walker or w/c? Yes  Grab bars in the bathroom? Yes  Shower chair or bench in shower? Yes  Elevated toilet seat or a handicapped toilet? No   TIMED UP AND GO:  Was the test performed? No . Audio Visit  Cognitive Function:     Immunizations Immunization History  Administered Date(s) Administered   Fluad Quad(high Dose 65+) 11/23/2018   Influenza Whole 12/08/2008   Influenza, High Dose Seasonal PF 10/22/2013, 11/12/2016   Influenza,inj,Quad PF,6+ Mos 11/08/2017   Influenza-Unspecified 11/23/2018   Pneumococcal Polysaccharide-23 05/09/2002, 07/18/2014   Td 05/09/2003   Tdap 08/15/2016   Zoster, Live 06/21/2013    Flu Vaccine status: Declined, Education has been provided  regarding the importance of this vaccine but patient still declined. Advised may receive  this vaccine at local pharmacy or Health Dept. Aware to provide a copy of the vaccination record if obtained from local pharmacy or Health Dept. Verbalized acceptance and understanding.  Pneumococcal vaccine status: Declined,  Education has been provided regarding the importance of this vaccine but patient still declined. Advised may receive this vaccine at local pharmacy or Health Dept. Aware to provide a copy of the vaccination record if obtained from local pharmacy or Health Dept. Verbalized acceptance and understanding.    Qualifies for Shingles Vaccine? Yes   Zostavax completed No   Shingrix Completed?: No.    Education has been provided regarding the importance of this vaccine. Patient has been advised to call insurance company to determine out of pocket expense if they have not yet received this vaccine. Advised may also receive vaccine at local pharmacy or Health Dept. Verbalized acceptance and understanding.  Screening Tests Health Maintenance  Topic Date Due   COVID-19 Vaccine (1) 02/14/2021 (Originally 10/14/1934)   Zoster Vaccines- Shingrix (1 of 2) 04/22/2021 (Originally 04/14/1953)   INFLUENZA VACCINE  05/07/2021 (Originally 09/07/2020)   Pneumonia Vaccine 58+ Years old (3 - PCV) 01/22/2022 (Originally 07/18/2015)   TETANUS/TDAP  08/16/2026   DEXA SCAN  Completed   HPV VACCINES  Aged Out    Health Maintenance  There are no preventive care reminders to display for this patient.   Additional Screening:   Vision Screening: Recommended annual ophthalmology exams for early detection of glaucoma and other disorders of the eye. Is the patient up to date with their annual eye exam?  Yes  Who is the provider or what is the name of the office in which the patient attends annual eye exams? Followed by Dr Katy Fitch  Dental Screening: Recommended annual dental exams for proper oral hygiene  Community  Resource Referral / Chronic Care Management:  CRR required this visit?  No   CCM required this visit?  No     Plan:     I have personally reviewed and noted the following in the patients chart:   Medical and social history Use of alcohol, tobacco or illicit drugs Patient currently not taking opioids Current medications and supplements including opioid prescriptions.  Functional ability and status Nutritional status Physical activity Advanced directives List of other physicians Hospitalizations, surgeries, and ER visits in previous 12 months Vitals Screenings to include cognitive, depression, and falls Referrals and appointments  In addition, I have reviewed and discussed with patient certain preventive protocols, quality metrics, and best practice recommendations. A written personalized care plan for preventive services as well as general preventive health recommendations were provided to patient.     Criselda Peaches, LPN   [id number]/29/[id number]    I have reviewed the documentation for the a AWV provided by the health coach and agree with their documentation.  I was immediately available for any questions.  I agree with Advanced Care Planning discussed at visit.  Micheline Rough, MD

## 2021-01-22 NOTE — Patient Instructions (Addendum)
[name] , Thank you for taking time to come for your Medicare Wellness Visit. I appreciate your ongoing commitment to your health goals. Please review the following plan we discussed and let me know if I can assist you in the future.   These are the goals we discussed:  Goals      Exercise 3x per week (30 min per time)     Maintain current lifestyle/ Walking daily.        This is a list of the screening recommended for you and due dates:  Health Maintenance  Topic Date Due   COVID-19 Vaccine (1) 02/14/2021*   Zoster (Shingles) Vaccine (1 of 2) 04/22/2021*   Flu Shot  05/07/2021*   Pneumonia Vaccine (3 - PCV) 01/22/2022*   Tetanus Vaccine  08/16/2026   DEXA scan (bone density measurement)  Completed   HPV Vaccine  Aged Out  *Topic was postponed. The date shown is not the original due date.   Advanced directives: Yes  Conditions/risks identified: None  Next appointment: Follow up in one year for your annual wellness visit    Preventive Care 65 Years and Older, Female Preventive care refers to lifestyle choices and visits with your health care provider that can promote health and wellness. What does preventive care include? A yearly physical exam. This is also called an annual well check. Dental exams once or twice a year. Routine eye exams. Ask your health care provider how often you should have your eyes checked. Personal lifestyle choices, including: Daily care of your teeth and gums. Regular physical activity. Eating a healthy diet. Avoiding tobacco and drug use. Limiting alcohol use. Practicing safe sex. Taking low-dose aspirin every day. Taking vitamin and mineral supplements as recommended by your health care provider. What happens during an annual well check? The services and screenings done by your health care provider during your annual well check will depend on your age, overall health, lifestyle risk factors, and family history of disease. Counseling   Your health care provider may ask you questions about your: Alcohol use. Tobacco use. Drug use. Emotional well-being. Home and relationship well-being. Sexual activity. Eating habits. History of falls. Memory and ability to understand (cognition). Work and work Statistician. Reproductive health. Screening  You may have the following tests or measurements: Height, weight, and BMI. Blood pressure. Lipid and cholesterol levels. These may be checked every 5 years, or more frequently if you are over 31 years old. Skin check. Lung cancer screening. You may have this screening every year starting at age 68 if you have a 30-pack-year history of smoking and currently smoke or have quit within the past 15 years. Fecal occult blood test (FOBT) of the stool. You may have this test every year starting at age 2. Flexible sigmoidoscopy or colonoscopy. You may have a sigmoidoscopy every 5 years or a colonoscopy every 10 years starting at age 6. Hepatitis C blood test. Hepatitis B blood test. Sexually transmitted disease (STD) testing. Diabetes screening. This is done by checking your blood sugar (glucose) after you have not eaten for a while (fasting). You may have this done every 1-3 years. Bone density scan. This is done to screen for osteoporosis. You may have this done starting at age 16. Mammogram. This may be done every 1-2 years. Talk to your health care provider about how often you should have regular mammograms. Talk with your health care provider about your test results, treatment options, and if necessary, the need for more tests. Vaccines  Your health care provider may recommend certain vaccines, such as: Influenza vaccine. This is recommended every year. Tetanus, diphtheria, and acellular pertussis (Tdap, Td) vaccine. You may need a Td booster every 10 years. Zoster vaccine. You may need this after age 41. Pneumococcal 13-valent conjugate (PCV13) vaccine. One dose is recommended  after age 72. Pneumococcal polysaccharide (PPSV23) vaccine. One dose is recommended after age 7. Talk to your health care provider about which screenings and vaccines you need and how often you need them. This information is not intended to replace advice given to you by your health care provider. Make sure you discuss any questions you have with your health care provider. Document Released: 02/20/2015 Document Revised: 10/14/2015 Document Reviewed: 11/25/2014 Elsevier Interactive Patient Education  2017 [employer] Prevention in the Home Falls can cause injuries. They can happen to people of all ages. There are many things you can do to make your home safe and to help prevent falls. What can I do on the outside of my home? Regularly fix the edges of walkways and driveways and fix any cracks. Remove anything that might make you trip as you walk through a door, such as a raised step or threshold. Trim any bushes or trees on the path to your home. Use bright outdoor lighting. Clear any walking paths of anything that might make someone trip, such as rocks or tools. Regularly check to see if handrails are loose or broken. Make sure that both sides of any steps have handrails. Any raised decks and porches should have guardrails on the edges. Have any leaves, snow, or ice cleared regularly. Use sand or salt on walking paths during winter. Clean up any spills in your garage right away. This includes oil or grease spills. What can I do in the bathroom? Use night lights. Install grab bars by the toilet and in the tub and shower. Do not use towel bars as grab bars. Use non-skid mats or decals in the tub or shower. If you need to sit down in the shower, use a plastic, non-slip stool. Keep the floor dry. Clean up any water that spills on the floor as soon as it happens. Remove soap buildup in the tub or shower regularly. Attach bath mats securely with double-sided non-slip rug tape. Do not  have throw rugs and other things on the floor that can make you trip. What can I do in the bedroom? Use night lights. Make sure that you have a light by your bed that is easy to reach. Do not use any sheets or blankets that are too big for your bed. They should not hang down onto the floor. Have a firm chair that has side arms. You can use this for support while you get dressed. Do not have throw rugs and other things on the floor that can make you trip. What can I do in the kitchen? Clean up any spills right away. Avoid walking on wet floors. Keep items that you use a lot in easy-to-reach places. If you need to reach something above you, use a strong step stool that has a grab bar. Keep electrical cords out of the way. Do not use floor polish or wax that makes floors slippery. If you must use wax, use non-skid floor wax. Do not have throw rugs and other things on the floor that can make you trip. What can I do with my stairs? Do not leave any items on the stairs. Make sure that there are  handrails on both sides of the stairs and use them. Fix handrails that are broken or loose. Make sure that handrails are as long as the stairways. Check any carpeting to make sure that it is firmly attached to the stairs. Fix any carpet that is loose or worn. Avoid having throw rugs at the top or bottom of the stairs. If you do have throw rugs, attach them to the floor with carpet tape. Make sure that you have a light switch at the top of the stairs and the bottom of the stairs. If you do not have them, ask someone to add them for you. What else can I do to help prevent falls? Wear shoes that: Do not have high heels. Have rubber bottoms. Are comfortable and fit you well. Are closed at the toe. Do not wear sandals. If you use a stepladder: Make sure that it is fully opened. Do not climb a closed stepladder. Make sure that both sides of the stepladder are locked into place. Ask someone to hold it for  you, if possible. Clearly mark and make sure that you can see: Any grab bars or handrails. First and last steps. Where the edge of each step is. Use tools that help you move around (mobility aids) if they are needed. These include: Canes. Walkers. Scooters. Crutches. Turn on the lights when you go into a dark area. Replace any light bulbs as soon as they burn out. Set up your furniture so you have a clear path. Avoid moving your furniture around. If any of your floors are uneven, fix them. If there are any pets around you, be aware of where they are. Review your medicines with your doctor. Some medicines can make you feel dizzy. This can increase your chance of falling. Ask your doctor what other things that you can do to help prevent falls. This information is not intended to replace advice given to you by your health care provider. Make sure you discuss any questions you have with your health care provider. Document Released: 11/20/2008 Document Revised: 07/02/2015 Document Reviewed: 02/28/2014 Elsevier Interactive Patient Education  2017 [employer].

## 2021-02-21 ENCOUNTER — Other Ambulatory Visit: Payer: Self-pay | Admitting: Family Medicine

## 2021-02-22 NOTE — Progress Notes (Signed)
Error

## 2021-03-04 ENCOUNTER — Other Ambulatory Visit: Payer: Self-pay | Admitting: Family Medicine

## 2021-03-15 ENCOUNTER — Telehealth: Payer: Self-pay | Admitting: Pharmacist

## 2021-03-15 NOTE — Chronic Care Management (AMB) (Addendum)
° ° °  Chronic Care Management Pharmacy Assistant   Name: [name]  MRN: [id number] DOB: 02-28-1934  Reason for Encounter: Disease State / COPD Assessment Call   Conditions to be addressed/monitored: HTN  Recent office visits:  01/22/2021 Rolene Arbour LPN - Medicare annual wellness exam  Recent consult visits:  01/19/2021 Zola Button MD (oncology) - Patient was seen for symptomatic anemia. No medications changes. Follow up in 6 months.  Hospital visits:  None  Medications: Outpatient Encounter Medications as of 03/15/2021  Medication Sig   Ferrous Sulfate (IRON) 325 (65 FE) MG TABS Take 1 tablet by mouth daily.    fluticasone (FLOVENT HFA) 110 MCG/ACT inhaler Take 2 puffs by mouth twice daily   ibuprofen (ADVIL) 200 MG tablet Take 200 mg by mouth every 6 (six) hours as needed.   levothyroxine (SYNTHROID) 75 MCG tablet TAKE 1 TABLET BY MOUTH EVERY DAY   mesalamine (LIALDA) 1.2 g EC tablet TAKE 2 TABLETS BY MOUTH DAILY WITH BREAKFAST.   Spacer/Aero-Holding Chambers (E-Z SPACER) inhaler Use as instructed   No facility-administered encounter medications on file as of 03/15/2021.  Fill History: FLOVENT HFA 110 MCG INHALER 02/22/2021 30   LEVOTHYROXINE 75 MCG TABLET 12/07/2020 90   MESALAMINE DR 1.2 GM TABLET 11/19/2020 90   Current COPD regimen: Flovent HFA 110 mcg/act 2 puffs twice daily  Any recent hospitalizations or ED visits since last visit with CPP? No recent hospital visits.  Reports denies COPD symptoms, she states she has been good for the past two years and since having Covid a few months ago, she has been fine as long as she doesn't over exert herself.   What recent interventions/DTPs have been made by any provider to improve breathing since last visit: No recent interventions.  Have you had exacerbation/flare-up since last visit? Patient denies any flare ups  What do you do when you are short of breath?  Patient states she is not exerting herself and hasn't had  any flare ups in two years.  Respiratory Devices/Equipment Do you have a nebulizer? Patient states she does not use a nebulizer. Do you use a Peak Flow Meter? Patient denies Do you use a maintenance inhaler? Yes, patient is using Flovent How often do you forget to use your daily inhaler? Patient denies forgetting to use inhaler  Do you use a rescue inhaler? Patient denies using a rescue inhaler. Do you use a spacer with your inhaler? Patient states she is using a spacer.   Adherence Review: Does the patient have >5 day gap between last estimated fill date for maintenance inhaler medications? No  Care Gaps: AWV - completed12/16/2022 Last BP - 140/74 on 01/19/2021 Shingrix - never done Covid vaccine - never done  Star Rating Drugs: None  [place] Pharmacist Assistant [phone]

## 2021-03-27 ENCOUNTER — Other Ambulatory Visit: Payer: Self-pay | Admitting: Nurse Practitioner

## 2021-04-03 ENCOUNTER — Other Ambulatory Visit: Payer: Self-pay | Admitting: Family Medicine

## 2021-04-26 NOTE — Progress Notes (Signed)
? ?ACUTE VISIT ?Chief Complaint  ?Patient presents with  ? Neck Pain  ?   ?  ? Shoulder Pain  ? Hip Pain  ?  X 2 weeks, no falls/injuries. Thought maybe it was arthritis, but pain is getting worse.  ? ?HPI: ?[name] is a 87 y.o. female with history of vitamin D deficiency, ulcerative colitis, abdominal pain, iron deficiency anemia, hypothyroidism, and COPD here today with her son complaining of worsening right hip pain. ?Pain is chronic but worse for the past few weeks. ?She points to lateral aspect of right hip and lower back. ? ?Having some neck pain, which she attributes to posture while using her walker. ?Right shoulder pain, which is chronic, history of rotator cuff tendinopathy, improved with PT.States that she is not concerned about shoulder pain. ?She would like a hip X ray "to be sure" it is not something serious. ?She is also requesting something for pain. ?No associated headache. ? ?Hip Pain  ?The incident occurred more than 1 week ago. There was no injury mechanism. The pain is present in the right hip. The pain is at a severity of 8/10. The pain is severe. The symptoms are aggravated by movement. She has tried elevation and acetaminophen for the symptoms. The treatment provided no relief.  ? Hx of OA. ?Her son thinks pain is caused by inactivity, apparently she sits most of the time with LE elevation. She states that she was instructed to elevate LE's frequently during the day to help with LE edema and venous stasis dermatitis. ?No hx of trauma. ?Pain is severe, constant, burning like,8-20/10,alleviated by rest. ? ?Lower back pain. ?She denies saddle anesthesia or changes in bowel/bladder function. ? ?Neck pain exacerbated by movement. ?Limitation of ROM. ?Acetaminophen does not help ? ?Lab Results  ?Component Value Date  ? ESRSEDRATE 24 08/21/2017  ? ?Lab Results  ?Component Value Date  ? CRP 4.8 (H) 03/16/2020  ? ?She also would like CBC done today because has had rectal bleed for a month,  once daily. ?Reporting hx of rectal bleed, which has been intermittent for years. ?She has follow-up with hematology due to anemia (Dr Alen Blew on 01/19/21) and in the past she has had blood transfusions. ?She denies pica, unusual fatigue,worsening dyspnea, palpitation, or CP. ?No associated abdominal pain, nausea, vomiting, or urinary symptoms. ? ?UC, last flexible sigmoidoscopy in 11/2019: Moderate diverticulosis in the sigmoid colon. There was narrowing of the colon in ?association with the diverticular opening. There was evidence of diverticular spasm. There was no evidence of diverticular bleeding. 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 ?Last visit with her GI on 12/17/2019. ? ?Lab Results  ?Component Value Date  ? WBC 7.1 01/19/2021  ? HGB 10.7 (L) 01/19/2021  ? HCT 31.8 (L) 01/19/2021  ? MCV 94.1 01/19/2021  ? PLT 373 01/19/2021  ? ?Lab Results  ?Component Value Date  ? CREATININE 0.67 11/02/2020  ? BUN 25 (H) 11/02/2020  ? NA 135 11/02/2020  ? K 4.2 11/02/2020  ? CL 103 11/02/2020  ? CO2 27 11/02/2020  ? ?Review of Systems  ?Constitutional:  Positive for activity change and fatigue. Negative for appetite change.  ?HENT:  Negative for nosebleeds and sore throat.   ?Respiratory:  Negative for cough and wheezing.   ?Genitourinary:  Negative for decreased urine volume, dysuria and hematuria.  ?Musculoskeletal:  Positive for gait problem.  ?Neurological:  Negative for syncope and facial asymmetry.  ?Psychiatric/Behavioral:  Negative for confusion. The patient is nervous/anxious.   ?Rest see pertinent positives and negatives per HPI. ? ?Current Outpatient Medications on File Prior to Visit  ?Medication Sig Dispense Refill  ? Ferrous Sulfate (IRON) 325 (65 FE) MG TABS Take 1 tablet by mouth daily.     ? fluticasone (FLOVENT HFA) 110 MCG/ACT inhaler Take 2 puffs by mouth twice daily 12 g  11  ? levothyroxine (SYNTHROID) 75 MCG tablet TAKE 1 TABLET BY MOUTH EVERY DAY 90 tablet 1  ? mesalamine (LIALDA) 1.2 g EC tablet Take 2 tablets (2.4 g total) by mouth daily with breakfast. **PLEASE CALL OFFICE TO SCHEDULE FOLLOW UP APPOINTMENT 180 tablet 1  ? Spacer/Aero-Holding Chambers (E-Z SPACER) inhaler Use as instructed 1 each 2  ? ?No current facility-administered medications on file prior to visit.  ? ?Past Medical History:  ?Diagnosis Date  ? ABDOMINAL PAIN, CHRONIC 05/28/2008  ? ALLERGIC RHINITIS 09/21/2006  ? ANEMIA-NOS 09/21/2006  ? ASTHMA 09/21/2006  ? ASYMPTOMATIC POSTMENOPAUSAL STATUS 05/28/2008  ? Cataract   ? CHEST PAIN, ATYPICAL 05/28/2008  ? COPD (chronic obstructive pulmonary disease) ([place])   ? COVID-19 virus infection 02/2020  ? DDD (degenerative disc disease)   ? DIVERTICULITIS, HX OF 09/21/2006  ? GERD 09/21/2006  ? HYPERCHOLESTEROLEMIA 06/05/2009  ? HYPERTENSION 09/21/2006  ? HYPOTHYROIDISM 09/21/2006  ? Peptic ulcer disease   ? Purpura ([place])   ? RENAL INSUFFICIENCY 09/21/2006  ? Shingles   ? Small bowel obstruction ([place])   ? Somatization disorder 04/25/2007  ? UNSPECIFIED URINARY CALCULUS 05/28/2008  ? UTI (urinary tract infection) 07/2019  ? ?Allergies  ?Allergen Reactions  ? [place] Nut (Berthollefia [place]) Skin Test Anaphylaxis  ? Fluconazole Shortness Of Breath  ? Metronidazole Shortness Of Breath and Nausea And Vomiting  ? Pepcid [Famotidine] Shortness Of Breath and Other (See Comments)  ?  Dizziness  ? Shellfish Allergy Anaphylaxis  ? Macrobid [Nitrofurantoin]   ?  REACTION: Syncope  ? Pantoprazole Other (See Comments)  ?  CHEST PAIN  ? ? ?Social History  ? ?Socioeconomic History  ? Marital status: Divorced  ?  Spouse name: Not on file  ? Number of children: 3  ? Years of education: Not on file  ? Highest education level: Not on file  ?Occupational History  ? Occupation: retired  ?Tobacco Use  ? Smoking status: Never  ? Smokeless tobacco: Never  ?Vaping Use  ? Vaping Use: Never used  ?Substance  and Sexual Activity  ? Alcohol use: No  ? Drug use: No  ? Sexual activity: Not Currently  ?Other Topics Concern  ? Not on file  ?Social History Narrative  ? Not on file  ? ?Social Determinants of Health  ? ?Financial Resource Strain: Low Risk   ? Difficulty of Paying Living Expenses: Not hard at all  ?Food Insecurity: No Food Insecurity  ? Worried About Charity fundraiser in the Last Year: Never true  ? Ran Out of Food in the Last Year: Never true  ?Transportation Needs: No Transportation Needs  ? Lack of Transportation (Medical): No  ? Lack of Transportation (Non-Medical): No  ?Physical Activity: Sufficiently Active  ? Days of Exercise per Week: 7 days  ? Minutes of Exercise per Session: 30 min  ?Stress: No Stress Concern Present  ? Feeling of Stress : Not at all  ?Social Connections: Moderately Integrated  ? Frequency of Communication with Friends and Family: More than three times a  week  ? Frequency of Social Gatherings with Friends and Family: More than three times a week  ? Attends Religious Services: 1 to 4 times per year  ? Active Member of Clubs or Organizations: Yes  ? Attends Archivist Meetings: 1 to 4 times per year  ? Marital Status: Divorced  ? ?Vitals:  ? 04/27/21 1420  ?BP: 126/80  ?Pulse: 95  ?Resp: 16  ?SpO2: 98%  ? ?Body mass index is 22.2 kg/m?. ? ?Physical Exam ?Vitals and nursing note reviewed.  ?Constitutional:   ?   General: She is not in acute distress. ?   Appearance: She is well-developed. She is not ill-appearing.  ?HENT:  ?   Head: Normocephalic and atraumatic.  ?   Mouth/Throat:  ?   Mouth: Mucous membranes are moist.  ?Eyes:  ?   Conjunctiva/sclera: Conjunctivae normal.  ?Cardiovascular:  ?   Rate and Rhythm: Normal rate and regular rhythm.  ?   Heart sounds: No murmur heard. ?Pulmonary:  ?   Effort: Pulmonary effort is normal. No respiratory distress.  ?Musculoskeletal:  ?   Lumbar back: Tenderness present.  ?     Back: ? ?   Right hip: Tenderness (Pain upon palpation of  lateral aspect of hip and proximal thigh.Limitation of rotation. Pain elicits with flexion, rotation,and abduction.) present. No deformity, bony tenderness or crepitus. Decreased range of motion.  ?Skin: ?

## 2021-04-27 ENCOUNTER — Ambulatory Visit (INDEPENDENT_AMBULATORY_CARE_PROVIDER_SITE_OTHER): Payer: Medicare Other | Admitting: Family Medicine

## 2021-04-27 ENCOUNTER — Encounter: Payer: Self-pay | Admitting: Family Medicine

## 2021-04-27 ENCOUNTER — Ambulatory Visit (INDEPENDENT_AMBULATORY_CARE_PROVIDER_SITE_OTHER): Payer: Medicare Other

## 2021-04-27 ENCOUNTER — Other Ambulatory Visit: Payer: Self-pay

## 2021-04-27 VITALS — BP 126/80 | HR 95 | Resp 16 | Ht 68.0 in

## 2021-04-27 DIAGNOSIS — M542 Cervicalgia: Secondary | ICD-10-CM

## 2021-04-27 DIAGNOSIS — M25551 Pain in right hip: Secondary | ICD-10-CM

## 2021-04-27 DIAGNOSIS — G8929 Other chronic pain: Secondary | ICD-10-CM | POA: Diagnosis not present

## 2021-04-27 DIAGNOSIS — D509 Iron deficiency anemia, unspecified: Secondary | ICD-10-CM

## 2021-04-27 DIAGNOSIS — K625 Hemorrhage of anus and rectum: Secondary | ICD-10-CM

## 2021-04-27 MED ORDER — MELOXICAM 7.5 MG PO TABS: 7.5000 mg | ORAL_TABLET | Freq: Every day | ORAL | 0 refills | Status: DC

## 2021-04-27 NOTE — Patient Instructions (Signed)
A few things to remember from today's visit: ? ?Chronic hip pain, right - Plan: meloxicam (MOBIC) 7.5 MG tablet, DG Hip Unilat W OR W/O Pelvis 2-3 Views Right ? ?If you need refills please call your pharmacy. ?Do not use My Chart to request refills or for acute issues that need immediate attention. ?  ?Meloxicam 7.5 mg daily and continue Tylenol 500 mg 3-4 times daily. ?Low impact exercise. ?Fall precautions. ? ?Please be sure medication list is accurate. ?If a new problem present, please set up appointment sooner than planned today. ? ? ? ? ? ? ? ?

## 2021-04-28 LAB — CBC
HCT: 33 % — ABNORMAL LOW (ref 36.0–46.0)
Hemoglobin: 10.9 g/dL — ABNORMAL LOW (ref 12.0–15.0)
MCHC: 33.1 g/dL (ref 30.0–36.0)
MCV: 94.6 fl (ref 78.0–100.0)
Platelets: 356 10*3/uL (ref 150.0–400.0)
RBC: 3.49 Mil/uL — ABNORMAL LOW (ref 3.87–5.11)
RDW: 13.7 % (ref 11.5–15.5)
WBC: 6.7 10*3/uL (ref 4.0–10.5)

## 2021-04-28 LAB — C-REACTIVE PROTEIN: CRP: <1 mg/dL (ref 0.5–20.0)

## 2021-05-04 ENCOUNTER — Ambulatory Visit (INDEPENDENT_AMBULATORY_CARE_PROVIDER_SITE_OTHER): Payer: Medicare Other | Admitting: Family Medicine

## 2021-05-04 ENCOUNTER — Encounter: Payer: Self-pay | Admitting: Family Medicine

## 2021-05-04 VITALS — BP 124/80 | HR 83 | Resp 16 | Ht 68.0 in

## 2021-05-04 DIAGNOSIS — M25551 Pain in right hip: Secondary | ICD-10-CM | POA: Diagnosis not present

## 2021-05-04 DIAGNOSIS — G8929 Other chronic pain: Secondary | ICD-10-CM | POA: Diagnosis not present

## 2021-05-04 DIAGNOSIS — D509 Iron deficiency anemia, unspecified: Secondary | ICD-10-CM

## 2021-05-04 DIAGNOSIS — K625 Hemorrhage of anus and rectum: Secondary | ICD-10-CM

## 2021-05-04 NOTE — Patient Instructions (Addendum)
A few things to remember from today's visit: ? ?Iron deficiency anemia, unspecified iron deficiency anemia type ? ?Rectal bleeding ? ?Chronic hip pain, right ? ?Do not use My Chart to request refills or for acute issues that need immediate attention. ?  ?Continue Tylenol 500 mg 3-4 times per day. ?We could try Hydrocodone-Acetaminophen 1/2-1 tab daily for pain. ? ?Please be sure medication list is accurate. ?If a new problem present, please set up appointment sooner than planned today. ? ? ? ? ? ? ? ?

## 2021-05-04 NOTE — Progress Notes (Signed)
? ? ?ACUTE VISIT ?Chief Complaint  ?Patient presents with  ? legs burning  ?  Feels like buckling when she stands up, unable to tolerate the meloxicam, and unable to take tylenol due to bleeding.  ? ?HPI: ?[name] is a 87 y.o. female, who is here today with her son complaining of severe right hip pain, lower extremity "stinging" and burning sensation, and unstable gait. ?She was seen on 04/27/2021 when she was complaining about severe right hip pain, problem is chronic. ?She tried meloxicam 7.5 mg x 1, she felt lower extremity burning sensation got worse, getting back to her baseline. ?LE burning sensation on pretibial area is chronic, she develops blisters intermittently, Dx'ed her with nonthrombocytic purpura and LE elevation has helped with symptoms. Ice pack on extremities also helps. ? ?04/27/21 right hip X ray:  ?1. Progressive collapse and sclerosis of the right femoral head most likely represents severe degenerative change. ?2. Progressive loss of joint space and sclerotic changes in the acetabulum is well ? ?She lives in a small apartment, able to walk around holding walls and furniture. No falls since her last visit. ?She is in pain all the time, unstable gait worse since COVID 19 infection in 03/2020. She completed outpt rehab , did not feel like it helped. ?She is in pain all the time. ?She has a cane and walker at home. ?She tried Tramadol before , did not tolerate well. ? ?She is having rectal bleeding daily since her last visit, "little drops" in the toilet.  She has had intermittent rectal bleed for the past year, follows with GI.  According to patient, she was told "nothing can be done." ?Ulcerative colitis, currently she is on mesalamine 1.2 g 2 tablets daily. ? ?She has intermittent generalized abdominal pain, no more than usual. ?Negative for SOB,palpitations,CP,changes in bowel habits, melena, or dyschezia. ?Stools are formed. ? ?Lab Results  ?Component Value Date  ? WBC 6.7 04/27/2021  ?  HGB 10.9 (L) 04/27/2021  ? HCT 33.0 (L) 04/27/2021  ? MCV 94.6 04/27/2021  ? PLT 356.0 04/27/2021  ? ?Review of Systems  ?Constitutional:  Positive for fatigue. Negative for activity change, chills and fever.  ?HENT:  Negative for mouth sores and sore throat.   ?Respiratory:  Negative for cough and wheezing.   ?Gastrointestinal:  Negative for nausea and vomiting.  ?Genitourinary:  Negative for decreased urine volume, dysuria and hematuria.  ?Musculoskeletal:  Positive for arthralgias, back pain, gait problem and neck pain.  ?Psychiatric/Behavioral:  Negative for confusion and sleep disturbance. The patient is nervous/anxious.   ?Rest see pertinent positives and negatives per HPI. ? ?Current Outpatient Medications on File Prior to Visit  ?Medication Sig Dispense Refill  ? Ferrous Sulfate (IRON) 325 (65 FE) MG TABS Take 1 tablet by mouth daily.     ? fluticasone (FLOVENT HFA) 110 MCG/ACT inhaler Take 2 puffs by mouth twice daily 12 g 11  ? levothyroxine (SYNTHROID) 75 MCG tablet TAKE 1 TABLET BY MOUTH EVERY DAY 90 tablet 1  ? mesalamine (LIALDA) 1.2 g EC tablet Take 2 tablets (2.4 g total) by mouth daily with breakfast. **PLEASE CALL OFFICE TO SCHEDULE FOLLOW UP APPOINTMENT 180 tablet 1  ? Spacer/Aero-Holding Chambers (E-Z SPACER) inhaler Use as instructed 1 each 2  ? ?No current facility-administered medications on file prior to visit.  ? ?Past Medical History:  ?Diagnosis Date  ? ABDOMINAL PAIN, CHRONIC 05/28/2008  ? ALLERGIC RHINITIS 09/21/2006  ? ANEMIA-NOS 09/21/2006  ? ASTHMA 09/21/2006  ?  ASYMPTOMATIC POSTMENOPAUSAL STATUS 05/28/2008  ? Cataract   ? CHEST PAIN, ATYPICAL 05/28/2008  ? COPD (chronic obstructive pulmonary disease) ([place])   ? COVID-19 virus infection 02/2020  ? DDD (degenerative disc disease)   ? DIVERTICULITIS, HX OF 09/21/2006  ? GERD 09/21/2006  ? HYPERCHOLESTEROLEMIA 06/05/2009  ? HYPERTENSION 09/21/2006  ? HYPOTHYROIDISM 09/21/2006  ? Peptic ulcer disease   ? Purpura ([place])   ? RENAL INSUFFICIENCY  09/21/2006  ? Shingles   ? Small bowel obstruction ([place])   ? Somatization disorder 04/25/2007  ? UNSPECIFIED URINARY CALCULUS 05/28/2008  ? UTI (urinary tract infection) 07/2019  ? ?Allergies  ?Allergen Reactions  ? [place] Nut (Berth[place]fia [place]) Skin Test Anaphylaxis  ? Fluconazole Shortness Of Breath  ? Metronidazole Shortness Of Breath and Nausea And Vomiting  ? Pepcid [Famotidine] Shortness Of Breath and Other (See Comments)  ?  Dizziness  ? Shellfish Allergy Anaphylaxis  ? Macrobid [Nitrofurantoin]   ?  REACTION: Syncope  ? Pantoprazole Other (See Comments)  ?  CHEST PAIN  ? ? ?Social History  ? ?Socioeconomic History  ? Marital status: Divorced  ?  Spouse name: Not on file  ? Number of children: 3  ? Years of education: Not on file  ? Highest education level: Not on file  ?Occupational History  ? Occupation: retired  ?Tobacco Use  ? Smoking status: Never  ? Smokeless tobacco: Never  ?Vaping Use  ? Vaping Use: Never used  ?Substance and Sexual Activity  ? Alcohol use: No  ? Drug use: No  ? Sexual activity: Not Currently  ?Other Topics Concern  ? Not on file  ?Social History Narrative  ? Not on file  ? ?Social Determinants of Health  ? ?Financial Resource Strain: Low Risk   ? Difficulty of Paying Living Expenses: Not hard at all  ?Food Insecurity: No Food Insecurity  ? Worried About Charity fundraiser in the Last Year: Never true  ? Ran Out of Food in the Last Year: Never true  ?Transportation Needs: No Transportation Needs  ? Lack of Transportation (Medical): No  ? Lack of Transportation (Non-Medical): No  ?Physical Activity: Sufficiently Active  ? Days of Exercise per Week: 7 days  ? Minutes of Exercise per Session: 30 min  ?Stress: No Stress Concern Present  ? Feeling of Stress : Not at all  ?Social Connections: Moderately Integrated  ? Frequency of Communication with Friends and Family: More than three times a week  ? Frequency of Social Gatherings with Friends and Family: More than three times a week  ?  Attends Religious Services: 1 to 4 times per year  ? Active Member of Clubs or Organizations: Yes  ? Attends Archivist Meetings: 1 to 4 times per year  ? Marital Status: Divorced  ? ?Vitals:  ? 05/04/21 1223  ?BP: 124/80  ?Pulse: 83  ?Resp: 16  ?SpO2: 97%  ? ?Body mass index is 22.2 kg/m?. ? ?Physical Exam ?Vitals and nursing note reviewed.  ?Constitutional:   ?   General: She is not in acute distress. ?   Appearance: She is well-developed.  ?HENT:  ?   Head: Normocephalic and atraumatic.  ?Eyes:  ?   Conjunctiva/sclera: Conjunctivae normal.  ?Cardiovascular:  ?   Rate and Rhythm: Normal rate and regular rhythm.  ?   Heart sounds: No murmur heard. ?   Comments: DP pulses present. ?Pulmonary:  ?   Effort: Pulmonary effort is normal. No respiratory distress.  ?  Breath sounds: Normal breath sounds.  ?Abdominal:  ?   Palpations: Abdomen is soft.  ?   Tenderness: There is no abdominal tenderness.  ?Genitourinary: ?   Comments: Declined rectal examination. ?Musculoskeletal:  ?   Right hip: Tenderness (with movement.) present. No deformity. Decreased range of motion (Not significant.).  ?Skin: ?   General: Skin is warm.  ?   Findings: No erythema or rash.  ?Neurological:  ?   Mental Status: She is alert and oriented to person, place, and time.  ?   Gait: Gait abnormal.  ?   Comments: Unstable and antalgic gait. She is in a wheel chair during visit.  ?Psychiatric:     ?   Mood and Affect: Mood is anxious.  ?   Comments: Well groomed, good eye contact.  ? ?ASSESSMENT AND PLAN: ? ?[name] was seen today for legs burning. ? ?Diagnoses and all orders for this visit: ? ?Chronic hip pain, right ?We reviewed hip X ray. ?Pain is affecting quality of life, feeling more unstable. ?She has other pains (shoulder and cervical pain). She tried Meloxicam x 1 and did not tolerate well, also concerned about possibility of increasing GI bleed. ?Tried Tramadol x 1, did not feel well. ?We discussed other options, Hydrocodone-  acetaminophen and just Tylenol. ?We reviewed side effects. Her son is concerned about chronic use of Tylenol, she feels like it helps some. ?We discussed side effects of opioid medications, including constipation a

## 2021-05-17 ENCOUNTER — Telehealth: Payer: Self-pay | Admitting: Family Medicine

## 2021-05-17 NOTE — Telephone Encounter (Signed)
Spoke with the patient and informed her a visit is needed for cough medication and scheduled a virtual visit on 4/11 with Dr Martinique as the patient does not have transportation.  Message sent to Dr Martinique to review for pain medication due to hip pain as PCP is out of the office. ?

## 2021-05-17 NOTE — Telephone Encounter (Signed)
Pt call and stated she want dr.Koberlein to call her the pain medication that was talked about at her last visit  she stated she want the one that make her sleepy and also want something for her cough [facility]  [id number]  [phone] ?Fax:  [phone] ?  ? ?

## 2021-05-17 NOTE — Progress Notes (Signed)
Virtual Visit via Video Note ?I connected with [name] on 05/18/21 by a video enabled telemedicine application and verified that I am speaking with the correct person using two identifiers. ? Location patient: home ?Location provider:work office ?Persons participating in the virtual visit: patient, son,provider ? ?I discussed the limitations of evaluation and management by telemedicine and the availability of in person appointments. The patient expressed understanding and agreed to proceed. ? ?Chief Complaint  ?Patient presents with  ? Cough  ?  Pateint complains of cough, x2 days, Non productive, Tried cough drops with little relief   ? Joint Pain  ? ?HPI: ?[name] is a 87 yo female with hx of generalized OA, hyperlipidemia, hypertension, COPD, ulcerative colitis, and chronic abdominal pain following on her last visit, 05/04/2021, when we discussed options to treat chronic pain. ?Lower extremity burning sensation, right hip pain, neck pain,and right shoulder pain mainly. ?Pain interferes with her daily activities. ? ?She did not tolerate tramadol in the past and she has history of GI bleed, we discussed the option of hydrocodone and side effects. ?She has decided to try hydrocodone-acetaminophen. ? ?-States that she has been coughing for the past 24 hours. According to her son, she has been coughing for a while, for at least 3 to 4 days but it seems to be worse in the past day or so. ?She states that she has had "tiny" shortness of breath and wheezing. ?COPD on Flovent 110 mcg 2 puff bid. ? ?Her son was recently diagnosed with URI. ? ?Negative for fever, chills, changes in appetite, sore throat, CP, changes in bowel habits, nausea, vomiting, urinary symptoms, or a skin rash. ? ?ROS: See pertinent positives and negatives per HPI. ? ?Past Medical History:  ?Diagnosis Date  ? ABDOMINAL PAIN, CHRONIC 05/28/2008  ? ALLERGIC RHINITIS 09/21/2006  ? ANEMIA-NOS 09/21/2006  ? ASTHMA 09/21/2006  ? ASYMPTOMATIC POSTMENOPAUSAL  STATUS 05/28/2008  ? Cataract   ? CHEST PAIN, ATYPICAL 05/28/2008  ? COPD (chronic obstructive pulmonary disease) ([place])   ? COVID-19 virus infection 02/2020  ? DDD (degenerative disc disease)   ? DIVERTICULITIS, HX OF 09/21/2006  ? GERD 09/21/2006  ? HYPERCHOLESTEROLEMIA 06/05/2009  ? HYPERTENSION 09/21/2006  ? HYPOTHYROIDISM 09/21/2006  ? Peptic ulcer disease   ? Purpura ([place])   ? RENAL INSUFFICIENCY 09/21/2006  ? Shingles   ? Small bowel obstruction ([place])   ? Somatization disorder 04/25/2007  ? UNSPECIFIED URINARY CALCULUS 05/28/2008  ? UTI (urinary tract infection) 07/2019  ? ? ?Past Surgical History:  ?Procedure Laterality Date  ? ABDOMINAL HYSTERECTOMY  1981  ? acute Nephritis  1958  ? APPENDECTOMY  1959  ? BIOPSY  11/18/2019  ? Procedure: BIOPSY;  Surgeon: Ladene Artist, MD;  Location: [facility];  Service: Endoscopy;;  ? CESAREAN SECTION    ? CHOLECYSTECTOMY  1983  ? COLON SURGERY    ? DENTAL SURGERY  1970  ? FLEXIBLE SIGMOIDOSCOPY N/A 11/18/2019  ? Procedure: FLEXIBLE SIGMOIDOSCOPY;  Surgeon: Ladene Artist, MD;  Location: [facility];  Service: Endoscopy;  Laterality: N/A;  ? NASAL SINUS SURGERY  1967  ? Pulmonary thrombosis    ? TONSILLECTOMY    ? TUBAL LIGATION    ? ? ?Family History  ?Problem Relation Age of Onset  ? Polycystic kidney disease Mother   ? Pancreatic cancer Mother   ? Other Father   ?     Schamberg disease  ? Bladder Cancer Father   ? Hypertension Father   ?  Marfan syndrome Son   ? Hemochromatosis Son   ? Cirrhosis Son   ? Allergic rhinitis Sister   ? Other Brother   ?     bone issue as child; multiple fractures but seemed to age out of this  ? Hemochromatosis Cousin   ? Arthritis Sister   ? Colon cancer Neg Hx   ? Esophageal cancer Neg Hx   ? Stomach cancer Neg Hx   ? ? ?Social History  ? ?Socioeconomic History  ? Marital status: Divorced  ?  Spouse name: Not on file  ? Number of children: 3  ? Years of education: Not on file  ? Highest education level: Not on file  ?Occupational History  ?  Occupation: retired  ?Tobacco Use  ? Smoking status: Never  ? Smokeless tobacco: Never  ?Vaping Use  ? Vaping Use: Never used  ?Substance and Sexual Activity  ? Alcohol use: No  ? Drug use: No  ? Sexual activity: Not Currently  ?Other Topics Concern  ? Not on file  ?Social History Narrative  ? Not on file  ? ?Social Determinants of Health  ? ?Financial Resource Strain: Low Risk   ? Difficulty of Paying Living Expenses: Not hard at all  ?Food Insecurity: No Food Insecurity  ? Worried About Charity fundraiser in the Last Year: Never true  ? Ran Out of Food in the Last Year: Never true  ?Transportation Needs: No Transportation Needs  ? Lack of Transportation (Medical): No  ? Lack of Transportation (Non-Medical): No  ?Physical Activity: Sufficiently Active  ? Days of Exercise per Week: 7 days  ? Minutes of Exercise per Session: 30 min  ?Stress: No Stress Concern Present  ? Feeling of Stress : Not at all  ?Social Connections: Moderately Integrated  ? Frequency of Communication with Friends and Family: More than three times a week  ? Frequency of Social Gatherings with Friends and Family: More than three times a week  ? Attends Religious Services: 1 to 4 times per year  ? Active Member of Clubs or Organizations: Yes  ? Attends Archivist Meetings: 1 to 4 times per year  ? Marital Status: Divorced  ?Intimate Partner Violence: Not At Risk  ? Fear of Current or Ex-Partner: No  ? Emotionally Abused: No  ? Physically Abused: No  ? Sexually Abused: No  ? ?Current Outpatient Medications:  ?  Ferrous Sulfate (IRON) 325 (65 FE) MG TABS, Take 1 tablet by mouth daily. , Disp: , Rfl:  ?  fluticasone (FLOVENT HFA) 110 MCG/ACT inhaler, Take 2 puffs by mouth twice daily, Disp: 12 g, Rfl: 11 ?  levothyroxine (SYNTHROID) 75 MCG tablet, TAKE 1 TABLET BY MOUTH EVERY DAY, Disp: 90 tablet, Rfl: 1 ?  mesalamine (LIALDA) 1.2 g EC tablet, Take 2 tablets (2.4 g total) by mouth daily with breakfast. **PLEASE CALL OFFICE TO SCHEDULE  FOLLOW UP APPOINTMENT, Disp: 180 tablet, Rfl: 1 ?  Spacer/Aero-Holding Chambers (E-Z SPACER) inhaler, Use as instructed, Disp: 1 each, Rfl: 2 ? ?EXAM: ? ?VITALS per patient if applicable:BP 938/10   Pulse 80   Temp 98 ?F (36.7 ?C)   Ht 5' 8"  (1.727 m)   Wt 146 lb (66.2 kg)   BMI 22.20 kg/m?  ? ?GENERAL: alert, oriented, appears well and in no acute distress ? ?HEENT: atraumatic, conjunctiva clear, no obvious abnormalities on inspection. ? ?LUNGS: on inspection no signs of respiratory distress, breathing rate appears normal, no obvious gross SOB, gasping  or wheezing ? ?CV: no obvious cyanosis ? ?PSYCH/NEURO: pleasant and cooperative, no obvious depression or anxiety, speech and thought processing grossly intact ? ?ASSESSMENT AND PLAN: ? ?Discussed the following assessment and plan: ? ?Chronic pain disorder - Plan: HYDROcodone-acetaminophen (NORCO/VICODIN) 5-325 MG tablet ?We had discussed possible side effects of hydrocodone-acetaminophen, recommend starting with 1/2 tablet twice daily as needed. ?Her son will try to stay with her when she first tries medication. ?Fall precautions discussed. ?If she decides to continue medication, she needs to arrange appointment for 4 weeks follow-up. ?Her son will let me know about tolerance in about a week. ? ?Cough, unspecified type ?No other associated symptoms. ?We discussed options, which included monitoring for changes or bring her to the office for chest x-ray. ?It could be related to COPD. ? ?Monitor for new symptoms. ?Continue Flovent 110 mcg twice daily. ?Clearly instructed about warning signs. ?If cough is not any better in a week, her son will bring her for CXR. ? ?We discussed possible serious and likely etiologies, options for evaluation and workup, limitations of telemedicine visit vs in person visit, treatment, treatment risks and precautions. ?The patient was advised to call back or seek an in-person evaluation if the symptoms worsen or if the condition fails  to improve as anticipated. ?I discussed the assessment and treatment plan with the patient. The patient was provided an opportunity to ask questions and all were answered. The patient agreed with the plan and

## 2021-05-18 ENCOUNTER — Telehealth (INDEPENDENT_AMBULATORY_CARE_PROVIDER_SITE_OTHER): Payer: Medicare Other | Admitting: Family Medicine

## 2021-05-18 ENCOUNTER — Encounter: Payer: Self-pay | Admitting: Family Medicine

## 2021-05-18 VITALS — BP 121/60 | HR 80 | Temp 98.0°F | Ht 68.0 in | Wt 146.0 lb

## 2021-05-18 DIAGNOSIS — G894 Chronic pain syndrome: Secondary | ICD-10-CM | POA: Diagnosis not present

## 2021-05-18 DIAGNOSIS — R059 Cough, unspecified: Secondary | ICD-10-CM | POA: Diagnosis not present

## 2021-05-18 MED ORDER — HYDROCODONE-ACETAMINOPHEN 5-325 MG PO TABS: 0.5000 | ORAL_TABLET | Freq: Two times a day (BID) | ORAL | 0 refills | Status: DC | PRN

## 2021-05-24 ENCOUNTER — Telehealth: Payer: Self-pay | Admitting: Pharmacist

## 2021-05-24 NOTE — Chronic Care Management (AMB) (Signed)
? ? ?  Chronic Care Management ?Pharmacy Assistant  ? ?Name: ZIYA COONROD  MRN: [id number] DOB: 04/24/1934 ? ?Reason for Encounter: Reschedule Appointment ?Reschedule appointment on 06/28/2021 with [place] Pharmacist. Appointment rescheduled with patient.  ?  ?Gennie Alma CMA  ?Clinical Pharmacist Assistant ?[phone] ? ?

## 2021-05-25 ENCOUNTER — Other Ambulatory Visit: Payer: Self-pay | Admitting: Family Medicine

## 2021-05-25 DIAGNOSIS — G8929 Other chronic pain: Secondary | ICD-10-CM

## 2021-06-28 ENCOUNTER — Telehealth: Payer: Medicare Other

## 2021-07-20 ENCOUNTER — Inpatient Hospital Stay: Payer: Medicare Other | Attending: Oncology

## 2021-07-20 ENCOUNTER — Inpatient Hospital Stay (HOSPITAL_BASED_OUTPATIENT_CLINIC_OR_DEPARTMENT_OTHER): Payer: Medicare Other | Admitting: Oncology

## 2021-07-20 ENCOUNTER — Other Ambulatory Visit: Payer: Self-pay

## 2021-07-20 VITALS — BP 123/65 | HR 86 | Temp 97.9°F | Resp 16 | Wt 152.7 lb

## 2021-07-20 DIAGNOSIS — Z7951 Long term (current) use of inhaled steroids: Secondary | ICD-10-CM | POA: Insufficient documentation

## 2021-07-20 DIAGNOSIS — D649 Anemia, unspecified: Secondary | ICD-10-CM

## 2021-07-20 DIAGNOSIS — Z79899 Other long term (current) drug therapy: Secondary | ICD-10-CM | POA: Diagnosis not present

## 2021-07-20 DIAGNOSIS — K529 Noninfective gastroenteritis and colitis, unspecified: Secondary | ICD-10-CM | POA: Diagnosis not present

## 2021-07-20 DIAGNOSIS — G629 Polyneuropathy, unspecified: Secondary | ICD-10-CM | POA: Diagnosis not present

## 2021-07-20 DIAGNOSIS — D508 Other iron deficiency anemias: Secondary | ICD-10-CM | POA: Insufficient documentation

## 2021-07-20 LAB — CBC WITH DIFFERENTIAL (CANCER CENTER ONLY)
Abs Immature Granulocytes: 0.02 10*3/uL (ref 0.00–0.07)
Basophils Absolute: 0.1 10*3/uL (ref 0.0–0.1)
Basophils Relative: 1 %
Eosinophils Absolute: 0 10*3/uL (ref 0.0–0.5)
Eosinophils Relative: 0 %
HCT: 31.6 % — ABNORMAL LOW (ref 36.0–46.0)
Hemoglobin: 10.6 g/dL — ABNORMAL LOW (ref 12.0–15.0)
Immature Granulocytes: 0 %
Lymphocytes Relative: 23 %
Lymphs Abs: 1.2 10*3/uL (ref 0.7–4.0)
MCH: 32 pg (ref 26.0–34.0)
MCHC: 33.5 g/dL (ref 30.0–36.0)
MCV: 95.5 fL (ref 80.0–100.0)
Monocytes Absolute: 0.8 10*3/uL (ref 0.1–1.0)
Monocytes Relative: 16 %
Neutro Abs: 3.2 10*3/uL (ref 1.7–7.7)
Neutrophils Relative %: 60 %
Platelet Count: 347 10*3/uL (ref 150–400)
RBC: 3.31 MIL/uL — ABNORMAL LOW (ref 3.87–5.11)
RDW: 13.3 % (ref 11.5–15.5)
WBC Count: 5.4 10*3/uL (ref 4.0–10.5)
nRBC: 0 % (ref 0.0–0.2)

## 2021-07-20 LAB — IRON AND IRON BINDING CAPACITY (CC-WL,HP ONLY)
Iron: 45 ug/dL (ref 28–170)
Saturation Ratios: 15 % (ref 10.4–31.8)
TIBC: 308 ug/dL (ref 250–450)
UIBC: 263 ug/dL (ref 148–442)

## 2021-07-20 LAB — FERRITIN: Ferritin: 41 ng/mL (ref 11–307)

## 2021-07-20 LAB — SAMPLE TO BLOOD BANK

## 2021-07-20 LAB — VITAMIN B12: Vitamin B-12: 315 pg/mL (ref 180–914)

## 2021-07-20 NOTE — Progress Notes (Signed)
Hematology and Oncology Follow Up Visit  [name] [id number] [birth year] 87 y.o. 07/20/2021 10:37 AM Margaret Macadam, MD (Inactive)Koberlein, Steele Berg, MD   Principle Diagnosis: 87 year old woman with iron deficiency anemia related to colitis diagnosed in September 2022.    Prior Therapy:  She is status post packed red cell transfusion and oral iron replacement.  Current therapy: Oral iron replacement.  Interim History: [name] is here for repeat evaluation.  Since last visit, she reports no major changes in her health.  She still limited in her mobility related to neuropathy and arthritis.  She denies any hematochezia or melena although she does report blood-tinged stool at times.  She denies any hemoptysis or hematemesis.  She continues to be on oral sulfate daily.     Medications: Updated on review. Current Outpatient Medications  Medication Sig Dispense Refill   Ferrous Sulfate (IRON) 325 (65 FE) MG TABS Take 1 tablet by mouth daily.      fluticasone (FLOVENT HFA) 110 MCG/ACT inhaler Take 2 puffs by mouth twice daily 12 g 11   HYDROcodone-acetaminophen (NORCO/VICODIN) 5-325 MG tablet Take 0.5 tablets by mouth 2 (two) times daily as needed for moderate pain. 15 tablet 0   levothyroxine (SYNTHROID) 75 MCG tablet TAKE 1 TABLET BY MOUTH EVERY DAY 90 tablet 1   mesalamine (LIALDA) 1.2 g EC tablet Take 2 tablets (2.4 g total) by mouth daily with breakfast. **PLEASE CALL OFFICE TO SCHEDULE FOLLOW UP APPOINTMENT 180 tablet 1   Spacer/Aero-Holding Chambers (E-Z SPACER) inhaler Use as instructed 1 each 2   No current facility-administered medications for this visit.     Allergies:  Allergies  Allergen Reactions   [place] Nut Daisey Must) Skin Test Anaphylaxis   Fluconazole Shortness Of Breath   Metronidazole Shortness Of Breath and Nausea And Vomiting   Pepcid [Famotidine] Shortness Of Breath and Other (See Comments)    Dizziness   Shellfish Allergy Anaphylaxis    Macrobid [Nitrofurantoin]     REACTION: Syncope   Pantoprazole Other (See Comments)    CHEST PAIN    Physical Exam: Blood pressure 123/65, pulse 86, temperature 97.9 F (36.6 C), temperature source Temporal, resp. rate 16, weight 152 lb 11.2 oz (69.3 kg), SpO2 95 %.  ECOG: 2   General appearance: Alert, awake without any distress. Head: Atraumatic without abnormalities Oropharynx: Without any thrush or ulcers. Eyes: No scleral icterus. Lymph nodes: No lymphadenopathy noted in the cervical, supraclavicular, or axillary nodes Heart:regular rate and rhythm, without any murmurs or gallops.   Lung: Clear to auscultation without any rhonchi, wheezes or dullness to percussion. Abdomin: Soft, nontender without any shifting dullness or ascites. Musculoskeletal: No clubbing or cyanosis. Neurological: No motor or sensory deficits. Skin: No rashes or lesions.      Lab Results: Lab Results  Component Value Date   WBC 5.4 07/20/2021   HGB 10.6 (L) 07/20/2021   HCT 31.6 (L) 07/20/2021   MCV 95.5 07/20/2021   PLT 347 07/20/2021     Chemistry      Component Value Date/Time   NA 135 11/02/2020 1625   K 4.2 11/02/2020 1625   CL 103 11/02/2020 1625   CO2 27 11/02/2020 1625   BUN 25 (H) 11/02/2020 1625   CREATININE 0.67 11/02/2020 1625   CREATININE 0.64 08/14/2020 1618      Component Value Date/Time   CALCIUM 9.3 11/02/2020 1625   CALCIUM 10.6 (H) 06/06/2009 0142   ALKPHOS 43 11/02/2020 1625   AST 24 11/02/2020 1625  ALT 13 11/02/2020 1625   BILITOT 0.4 11/02/2020 1625        Impression and Plan:   87 year old woman with:  1.  Iron deficiency anemia diagnosed in September 2022.  This is in the setting of poor iron absorption and colitis.    She continues to be on oral iron replacement with adequate hemoglobin currently at 10.6.  Iron studies in December 2022 showed normal iron of 48 and ferritin of 38.  I recommended continuing this treatment at this time.  Intravenous  iron can be used in the future if she has more deficiency.      2.  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   3.  Follow-up: She will return in 6 months for follow-up.     20  minutes were dedicated to this visit.  Time spent on reviewing laboratory data, disease status update and answering questions regarding future plan of care.     Zola Button, MD 6/13/202310:37 AM

## 2021-07-27 ENCOUNTER — Telehealth: Payer: Self-pay | Admitting: Pharmacist

## 2021-07-27 NOTE — Chronic Care Management (AMB) (Signed)
    Chronic Care Management Pharmacy Assistant   Name: JENEAN ESCANDON  MRN: [id number] DOB: 03-10-1934  07/28/2021 APPOINTMENT REMINDER  Called [facility], No answer, left message of appointment on 07/28/2021 at 1:00 via telephone visit with Jeni Salles, Pharm D. Notified to have all medications, supplements, blood pressure and/or blood sugar logs available during appointment and to return call if need to reschedule.  Care Gaps: AWV - scheduled 01/24/2022 Last BP - 123/65 on 07/20/2021 Covid vaccine - never done Pneumonia vaccine - postponed Shingrix - postponed  Star Rating Drug: None  Any gaps in medications fill history? No  Gennie Alma [facility]  Catering manager [phone]

## 2021-07-28 ENCOUNTER — Ambulatory Visit: Payer: Medicare Other | Admitting: Pharmacist

## 2021-07-28 DIAGNOSIS — J449 Chronic obstructive pulmonary disease, unspecified: Secondary | ICD-10-CM

## 2021-07-28 DIAGNOSIS — E039 Hypothyroidism, unspecified: Secondary | ICD-10-CM

## 2021-07-28 NOTE — Progress Notes (Signed)
Chronic Care Management Pharmacy Note  08/04/2021 Name:  [name] MRN:  [id number] DOB:  07-Nov-1934  Summary: BP is at goal < 140/90 per home readings TSH not at goal of 2.5-4.5   Recommendations/Changes made from today's visit: -Recommended repeat TSH -Recommend restarting vitamin D 1000 units daily -Consider repeat DEXA -Recommend repeat PFTs   Plan: Patient to set up PCP visit to establish care Follow up COPD/asthma assessment in 2-3 months   Subjective: [name] is an 87 y.o. year old female who is a primary patient of Koberlein, Steele Berg, MD (Inactive).  The CCM team was consulted for assistance with disease management and care coordination needs.    Engaged with patient by telephone for follow up visit in response to provider referral for pharmacy case management and/or care coordination services.   Consent to Services:  The patient was given information about Chronic Care Management services, agreed to services, and gave verbal consent prior to initiation of services.  Please see initial visit note for detailed documentation.   Patient Care Team: Caren Macadam, MD (Inactive) as PCP - General (Family Medicine) Jari Pigg, MD as Attending Physician (Dermatology) Clent Jacks, MD (Ophthalmology) Viona Gilmore, [facility] as Pharmacist (Pharmacist)  Recent office visits: 05/18/21 Betty Martinique, MD: Patient presented for chronic pain and cough. Prescribed Hydrocodone-APAP for PRN use and follow up in 4 weeks.  05/04/21 Betty Martinique, MD: Patient presented for legs burning. Continue Tylenol PRN.  04/27/21 Betty Martinique, MD: Patient presented for chronic pain. Prescribed meloxicam 7.5 mg PRN and recommended follow up in 4 weeks.  01/22/2021 Rolene Arbour LPN - Medicare annual wellness exam.  Recent consult visits: 07/20/21 Zola Button MD (onconlogy): Patient presented for symptomatic anemia. No medication changes. Follow up in 6 months.  01/19/2021 Zola Button  MD (oncology) - Patient was seen for symptomatic anemia. No medications changes. Follow up in 6 months.  Hospital visits: None in previous 6 months.  Objective:  Lab Results  Component Value Date   CREATININE 0.67 11/02/2020   BUN 25 (H) 11/02/2020   GFR 93.60 07/31/2019   GFRNONAA >60 11/02/2020   GFRAA >60 08/09/2019   NA 135 11/02/2020   K 4.2 11/02/2020   CALCIUM 9.3 11/02/2020   CO2 27 11/02/2020   GLUCOSE 114 (H) 11/02/2020    Lab Results  Component Value Date/Time   HGBA1C 5.6 06/21/2013 11:45 AM   GFR 93.60 07/31/2019 02:18 PM   GFR 78.58 07/03/2019 02:22 PM    Last diabetic Eye exam: No results found for: "HMDIABEYEEXA"  Last diabetic Foot exam: No results found for: "HMDIABFOOTEX"   Lab Results  Component Value Date   CHOL 170 12/11/2019   HDL 56 12/11/2019   LDLCALC 87 12/11/2019   LDLDIRECT 120.8 06/05/2009   TRIG 178 (H) 12/11/2019   CHOLHDL 3.0 12/11/2019       Latest Ref Rng & Units 11/02/2020    4:25 PM 10/13/2020    3:54 PM 08/14/2020    4:18 PM  Hepatic Function  Total Protein 6.5 - 8.1 g/dL 7.0  6.2  7.0   Albumin 3.5 - 5.0 g/dL 3.9  3.8    AST 15 - 41 U/L 24  17  17    ALT 0 - 44 U/L 13  9  8    Alk Phosphatase 38 - 126 U/L 43  39    Total Bilirubin 0.3 - 1.2 mg/dL 0.4  0.2  0.3     Lab Results  Component  Value Date/Time   TSH 5.436 (H) 12/04/2020 11:29 AM   TSH 6.320 (H) 11/02/2020 04:25 PM   TSH 0.11 (L) 08/14/2020 04:18 PM   TSH 2.81 12/11/2019 10:51 AM   FREET4 0.86 11/02/2020 04:25 PM       Latest Ref Rng & Units 07/20/2021   10:11 AM 04/27/2021    3:37 PM 01/19/2021   11:21 AM  CBC  WBC 4.0 - 10.5 K/uL 5.4  6.7  7.1   Hemoglobin 12.0 - 15.0 g/dL 10.6  10.9  10.7   Hematocrit 36.0 - 46.0 % 31.6  33.0  31.8   Platelets 150 - 400 K/uL 347  356.0  373     Lab Results  Component Value Date/Time   VD25OH 31 08/14/2020 04:18 PM   VD25OH 50.44 07/31/2019 02:18 PM   VD25OH 21.66 (L) 04/30/2019 02:49 PM    Clinical ASCVD: No   The ASCVD Risk score (Arnett DK, et al., 2019) failed to calculate for the following reasons:   The 2019 ASCVD risk score is only valid for ages 65 to 54       01/22/2021   11:20 AM 01/22/2020   11:32 AM 01/19/2017    4:51 PM  Depression screen PHQ 2/9  Decreased Interest 0 0 0  Down, Depressed, Hopeless 0 0 0  PHQ - 2 Score 0 0 0  Altered sleeping  0   Tired, decreased energy  0   Change in appetite  0   Feeling bad or failure about yourself   0   Trouble concentrating  0   Moving slowly or fidgety/restless  0   Suicidal thoughts  0   PHQ-9 Score  0   Difficult doing work/chores  Not difficult at all         No data to display              No data to display            Social History   Tobacco Use  Smoking Status Never  Smokeless Tobacco Never   BP Readings from Last 3 Encounters:  07/20/21 123/65  05/18/21 121/60  05/04/21 124/80   Pulse Readings from Last 3 Encounters:  07/20/21 86  05/18/21 80  05/04/21 83   Wt Readings from Last 3 Encounters:  07/20/21 152 lb 11.2 oz (69.3 kg)  05/18/21 146 lb (66.2 kg)  01/22/21 146 lb (66.2 kg)   BMI Readings from Last 3 Encounters:  07/20/21 23.22 kg/m  05/18/21 22.20 kg/m  05/04/21 22.20 kg/m    Assessment/Interventions: Review of patient past medical history, allergies, medications, health status, including review of consultants reports, laboratory and other test data, was performed as part of comprehensive evaluation and provision of chronic care management services.   SDOH:  (Social Determinants of Health) assessments and interventions performed: Yes SDOH Interventions    Flowsheet Row Most Recent Value  SDOH Interventions   Financial Strain Interventions Intervention Not Indicated       SDOH Screenings   Alcohol Screen: Low Risk  (01/22/2020)   Alcohol Screen    Last Alcohol Screening Score (AUDIT): 0  Depression (PHQ2-9): Low Risk  (01/22/2021)   Depression (PHQ2-9)    PHQ-2 Score:  0  Financial Resource Strain: Low Risk  (08/04/2021)   Overall Financial Resource Strain (CARDIA)    Difficulty of Paying Living Expenses: Not hard at all  Food Insecurity: No Food Insecurity (01/22/2021)   Hunger Vital Sign    Worried About  Running Out of Food in the Last Year: Never true    Ran Out of Food in the Last Year: Never true  Housing: Low Risk  (01/22/2021)   Housing    Last Housing Risk Score: 0  Physical Activity: Sufficiently Active (01/22/2021)   Exercise Vital Sign    Days of Exercise per Week: 7 days    Minutes of Exercise per Session: 30 min  Social Connections: Moderately Integrated (01/22/2021)   Social Connection and Isolation Panel [NHANES]    Frequency of Communication with Friends and Family: More than three times a week    Frequency of Social Gatherings with Friends and Family: More than three times a week    Attends Religious Services: 1 to 4 times per year    Active Member of [employer] or Organizations: Yes    Attends Archivist Meetings: 1 to 4 times per year    Marital Status: Divorced  Stress: No Stress Concern Present (01/22/2021)   [place]    Feeling of Stress : Not at all  Tobacco Use: Low Risk  (05/18/2021)   Patient History    Smoking Tobacco Use: Never    Smokeless Tobacco Use: Never    Passive Exposure: Not on file  Transportation Needs: No Transportation Needs (01/22/2021)   PRAPARE - Transportation    Lack of Transportation (Medical): No    Lack of Transportation (Non-Medical): No    CCM Care Plan  Allergies  Allergen Reactions   [place] Nut Daisey Must) Skin Test Anaphylaxis   Fluconazole Shortness Of Breath   Metronidazole Shortness Of Breath and Nausea And Vomiting   Pepcid [Famotidine] Shortness Of Breath and Other (See Comments)    Dizziness   Shellfish Allergy Anaphylaxis   Macrobid [Nitrofurantoin]     REACTION: Syncope   Pantoprazole Other  (See Comments)    CHEST PAIN    Medications Reviewed Today     Reviewed by Viona Gilmore, [facility] (Pharmacist) on 07/28/21 at 1323  Med List Status: <None>   Medication Order Taking? Sig Documenting Provider Last Dose Status Informant  acetaminophen (TYLENOL) 500 MG tablet [id number] Yes Take 500 mg by mouth every 6 (six) hours as needed for moderate pain. [provider] Taking Active   Ferrous Sulfate (IRON) 325 (65 FE) MG TABS [id number] Yes Take 1 tablet by mouth daily.  [provider] Taking Active Self  fluticasone (FLOVENT HFA) 110 MCG/ACT inhaler [id number] Yes Take 2 puffs by mouth twice daily Koberlein, Junell C, MD Taking Active Self  HYDROcodone-acetaminophen (NORCO/VICODIN) 5-325 MG tablet [id number] No Take 0.5 tablets by mouth 2 (two) times daily as needed for moderate pain.  Patient not taking: Reported on 07/28/2021   Martinique, Betty G, MD Not Taking Active   levothyroxine (SYNTHROID) 75 MCG tablet [id number] Yes TAKE 1 TABLET BY MOUTH EVERY DAY Koberlein, Junell C, MD Taking Active   mesalamine (LIALDA) 1.2 g EC tablet [id number] Yes Take 2 tablets (2.4 g total) by mouth daily with breakfast. **PLEASE CALL OFFICE TO SCHEDULE FOLLOW UP APPOINTMENT Willia Craze, NP Taking Active   Spacer/Aero-Holding Chambers (E-Z SPACER) inhaler [id number]  Use as instructed Caren Macadam, MD  Active Self            Patient Active Problem List   Diagnosis Date Noted   Symptomatic anemia 10/13/2020   GIB (gastrointestinal bleeding) 03/11/2020   Chronic ulcerative rectosigmoiditis with rectal bleeding (HCC)    Abnormal CT scan,  gastrointestinal tract    LLQ abdominal pain 10/31/2019   Diverticulitis 10/31/2019   UTI (urinary tract infection) 08/06/2019   COPD (chronic obstructive pulmonary disease) (HCC)    Ulcerative colitis ([place])    Vitamin D deficiency 05/01/2019   Arthralgia 08/21/2017   Osteoporosis 11/21/2016   Postmenopausal status 09/06/2016   Other  nonspecific abnormal finding of lung field 08/14/2015   Hyponatremia 07/21/2014   Menopausal state 07/18/2014   Hematest positive stools 08/05/2013   Anemia, blood loss 08/05/2013   Cerumen impaction 06/03/2013   History of adrenal disorder 10/15/2012   Rash and nonspecific skin eruption 10/15/2012   Iron deficiency anemia 06/20/2012   Psychogenic polydipsia 06/20/2012   Nonspecific abnormal electrocardiogram (ECG) (EKG) 06/20/2012   Polyuria 05/02/2012   Pelvic pain in female 08/16/2011   Leg ulcer ([place]) 06/16/2010   Routine general medical examination at a health care facility 06/11/2010   Encounter for long-term (current) use of other medications 06/11/2010   HYPERCHOLESTEROLEMIA 06/05/2009   UNSPECIFIED URINARY CALCULUS 05/28/2008   CHEST PAIN, ATYPICAL 05/28/2008   ABDOMINAL PAIN, CHRONIC 05/28/2008   ASYMPTOMATIC POSTMENOPAUSAL STATUS 05/28/2008   SOMATIZATION DISORDER 04/25/2007   Hypothyroidism 09/21/2006   Essential hypertension 09/21/2006   ALLERGIC RHINITIS 09/21/2006   ASTHMA 09/21/2006   GERD 09/21/2006   Disorder resulting from impaired renal function 09/21/2006   DIVERTICULITIS, HX OF 09/21/2006    Immunization History  Administered Date(s) Administered   Fluad Quad(high Dose 65+) 11/23/2018   Influenza Whole 12/08/2008   Influenza, High Dose Seasonal PF 10/22/2013, 11/12/2016   Influenza,inj,Quad PF,6+ Mos 11/08/2017   Influenza-Unspecified 11/23/2018   Pneumococcal Polysaccharide-23 05/09/2002, 07/18/2014   Td 05/09/2003   Tdap 08/15/2016   Zoster, Live 06/21/2013   Patient reports the top of her legs burn and this seems to be getting worse. Patient was first diagnosed years ago and it has progressively gotten worse.  Patient reports her BP today was 136/68 pulse 70 and her temp was 97.5. Patient reports she is more tired today.   Conditions to be addressed/monitored:  Hypertension, GERD, COPD, Hypothyroidism, Osteoporosis, and UC  Conditions  addressed this visit: Hypertension, COPD  Care Plan : CCM Pharmacy Care Plan  Updates made by Viona Gilmore, [place] since 08/04/2021 12:00 AM     Problem: Problem: Hypertension, GERD, COPD, Hypothyroidism, Osteoporosis, and UC      Long-Range Goal: Patient-Specific Goal   Start Date: 12/28/2020  Expected End Date: 12/28/2021  Recent Progress: On track  Priority: High  Note:   Current Barriers:  Unable to independently monitor therapeutic efficacy Unable to achieve control of TSH   Pharmacist Clinical Goal(s):  Patient will achieve adherence to monitoring guidelines and medication adherence to achieve therapeutic efficacy achieve control of thyroid as evidenced by TSH  through collaboration with PharmD and provider.   Interventions: 1:1 collaboration with Caren Macadam, MD regarding development and update of comprehensive plan of care as evidenced by provider attestation and co-signature Inter-disciplinary care team collaboration (see longitudinal plan of care) Comprehensive medication review performed; medication list updated in electronic medical record  Hypertension (BP goal <140/90) -Controlled -Current treatment: No medications -Medications previously tried: none  -Current home readings: uses an arm cuff every 1-2 weeks -Current dietary habits: has had low sodium in the past and eats saltines to increase intake -Current exercise habits: tries to be as active as possible -Reports hypotensive/hypertensive symptoms -Educated on Importance of home blood pressure monitoring; Proper BP monitoring technique; -Counseled to monitor BP at home weekly,  document, and provide log at future appointments -Counseled on diet and exercise extensively  COPD/asthma (Goal: control symptoms and prevent exacerbations) -Not ideally controlled -Current treatment  Flovent HFA 110 mcg/act 2 puffs twice daily - Query Appropriate, Query effective, Safe, Accessible -Medications previously  tried: n/a  -Gold Grade: n/a -Current COPD Classification:  A (low sx, <2 exacerbations/yr) -MMRC/CAT score: n/a -Pulmonary function testing: could not locate -Exacerbations requiring treatment in last 6 months: none -Patient reports consistent use of maintenance inhaler -Frequency of rescue inhaler use: does not have one -Counseled on Proper inhaler technique; -Recommended PFTs given query diagnosis for COPD/asthma.  Osteoporosis / Osteopenia (Goal prevent fractures) -Uncontrolled -Last DEXA Scan: 2018   T-Score femoral neck: -1.8  T-Score total hip: n/a  T-Score lumbar spine: -0.5  T-Score forearm radius: n/a  10-year probability of major osteoporotic fracture: 14.1%  10-year probability of hip fracture: 4.1% -Patient is not a candidate for pharmacologic treatment -Current treatment  No medications -Medications previously tried: none  -Recommend [phone] units of vitamin D daily. Recommend 1200 mg of calcium daily from dietary and supplemental sources. -Collaborated with PCP about restarting calcium and vitamin D supplementation.  Ulcerative colitis (Goal: minimize symptoms) -Controlled -Current treatment  Mesalamine 1.2 g 2 tablets daily with breakfast - Appropriate, Effective, Safe, Accessible -Medications previously tried: none  -Recommended to continue current medication  Hypothyroidism (Goal: 2.5-4.5) -Uncontrolled -Current treatment  Levothyroxine 75 mcg 1 tablet daily - Appropriate, Query effective, Safe, Accessible -Medications previously tried: none  -Recommended to continue current medication Counseled on separating from iron tablets by 4 hours.  Anemia (Goal: minimize bleeding, HgB > 11) -Not ideally controlled -Current treatment  Ferrous sulfate 325 mg 1 tablet daily - Appropriate, Effective, Safe, Accessible -Medications previously tried: none  -Recommended to continue current medication   Health Maintenance -Vaccine gaps: shingrix, Pneumovax,  influenza -Current therapy:  Acetaminophen 500 mg as needed Tums as needed (couple times a week)  -Educated on Cost vs benefit of each product must be carefully weighed by individual consumer -Patient is satisfied with current therapy and denies issues -Recommended to continue current medication  Patient Goals/Self-Care Activities Patient will:  - take medications as prescribed as evidenced by patient report and record review check blood pressure weekly and when symptomatic, document, and provide at future appointments  Follow Up Plan: The care management team will reach out to the patient again over the next 30 days.       Medication Assistance: None required.  Patient affirms current coverage meets needs.  Compliance/Adherence/Medication fill history: Care Gaps: Shingrix, Pneumovax Last BP - 123/65 on 07/20/2021  Star-Rating Drugs: None  Patient[facility] pharmacy [facility]  [id number]: [phone]: [phone]  Uses pill box? No - keeps them in the bottle Pt endorses 99% compliance  We discussed: Current pharmacy is preferred with insurance plan and patient is satisfied with pharmacy services Patient decided to: Continue current medication management strategy  Care Plan and Follow Up Patient Decision:  Patient agrees to Care Plan and Follow-up.  Plan: The care management team will reach out to the patient again over the next 14 days.  MJeni Salles PharmD, BMontrealPharmacist [place]

## 2021-08-04 NOTE — Patient Instructions (Addendum)
Hi [name],  It was great to catch up with you again!  Don't forget to make sure to take a multivitamin (with 25 mcg or 1000 units of vitamin D) or a standalone vitamin D to support your bones.  Please reach out to me if you have any questions or need anything before I reach back out!  Best, Maddie  Jeni Salles, PharmD, [place] [facility] [place]   Visit Information   Goals Addressed   None    Patient Care Plan: CCM Pharmacy Care Plan     Problem Identified: Problem: Hypertension, GERD, COPD, Hypothyroidism, Osteoporosis, and UC      Long-Range Goal: Patient-Specific Goal   Start Date: 12/28/2020  Expected End Date: 12/28/2021  Recent Progress: On track  Priority: High  Note:   Current Barriers:  Unable to independently monitor therapeutic efficacy Unable to achieve control of TSH   Pharmacist Clinical Goal(s):  Patient will achieve adherence to monitoring guidelines and medication adherence to achieve therapeutic efficacy achieve control of thyroid as evidenced by TSH  through collaboration with PharmD and provider.   Interventions: 1:1 collaboration with Caren Macadam, MD regarding development and update of comprehensive plan of care as evidenced by provider attestation and co-signature Inter-disciplinary care team collaboration (see longitudinal plan of care) Comprehensive medication review performed; medication list updated in electronic medical record  Hypertension (BP goal <140/90) -Controlled -Current treatment: No medications -Medications previously tried: none  -Current home readings: uses an arm cuff every 1-2 weeks -Current dietary habits: has had low sodium in the past and eats saltines to increase intake -Current exercise habits: tries to be as active as possible -Reports hypotensive/hypertensive symptoms -Educated on Importance of home blood pressure monitoring; Proper BP monitoring  technique; -Counseled to monitor BP at home weekly, document, and provide log at future appointments -Counseled on diet and exercise extensively  COPD/asthma (Goal: control symptoms and prevent exacerbations) -Not ideally controlled -Current treatment  Flovent HFA 110 mcg/act 2 puffs twice daily - Query Appropriate, Query effective, Safe, Accessible -Medications previously tried: n/a  -Gold Grade: n/a -Current COPD Classification:  A (low sx, <2 exacerbations/yr) -MMRC/CAT score: n/a -Pulmonary function testing: could not locate -Exacerbations requiring treatment in last 6 months: none -Patient reports consistent use of maintenance inhaler -Frequency of rescue inhaler use: does not have one -Counseled on Proper inhaler technique; -Recommended PFTs given query diagnosis for COPD/asthma.  Osteoporosis / Osteopenia (Goal prevent fractures) -Uncontrolled -Last DEXA Scan: 2018   T-Score femoral neck: -1.8  T-Score total hip: n/a  T-Score lumbar spine: -0.5  T-Score forearm radius: n/a  10-year probability of major osteoporotic fracture: 14.1%  10-year probability of hip fracture: 4.1% -Patient is not a candidate for pharmacologic treatment -Current treatment  No medications -Medications previously tried: none  -Recommend [phone] units of vitamin D daily. Recommend 1200 mg of calcium daily from dietary and supplemental sources. -Collaborated with PCP about restarting calcium and vitamin D supplementation.  Ulcerative colitis (Goal: minimize symptoms) -Controlled -Current treatment  Mesalamine 1.2 g 2 tablets daily with breakfast - Appropriate, Effective, Safe, Accessible -Medications previously tried: none  -Recommended to continue current medication  Hypothyroidism (Goal: 2.5-4.5) -Uncontrolled -Current treatment  Levothyroxine 75 mcg 1 tablet daily - Appropriate, Query effective, Safe, Accessible -Medications previously tried: none  -Recommended to continue current  medication Counseled on separating from iron tablets by 4 hours.  Anemia (Goal: minimize bleeding, HgB > 11) -Not ideally controlled -Current treatment  Ferrous sulfate 325 mg 1  tablet daily - Appropriate, Effective, Safe, Accessible -Medications previously tried: none  -Recommended to continue current medication   Health Maintenance -Vaccine gaps: shingrix, Pneumovax, influenza -Current therapy:  Acetaminophen 500 mg as needed Tums as needed (couple times a week)  -Educated on Cost vs benefit of each product must be carefully weighed by individual consumer -Patient is satisfied with current therapy and denies issues -Recommended to continue current medication  Patient Goals/Self-Care Activities Patient will:  - take medications as prescribed as evidenced by patient report and record review check blood pressure weekly and when symptomatic, document, and provide at future appointments  Follow Up Plan: The care management team will reach out to the patient again over the next 30 days.        Patient verbalizes understanding of instructions and care plan provided today and agrees to view in [place]. Active MyChart status and patient understanding of how to access instructions and care plan via MyChart confirmed with patient.    The pharmacy team will reach out to the patient again over the next 14 days.   Viona Gilmore, [facility]

## 2021-08-27 ENCOUNTER — Other Ambulatory Visit: Payer: Self-pay | Admitting: *Deleted

## 2021-08-27 MED ORDER — FLUTICASONE PROPIONATE HFA 110 MCG/ACT IN AERO: INHALATION_SPRAY | RESPIRATORY_TRACT | 0 refills | Status: DC

## 2021-09-23 ENCOUNTER — Other Ambulatory Visit: Payer: Self-pay

## 2021-09-24 ENCOUNTER — Other Ambulatory Visit: Payer: Self-pay | Admitting: Family

## 2021-09-28 ENCOUNTER — Other Ambulatory Visit: Payer: Self-pay | Admitting: Family Medicine

## 2021-10-01 NOTE — Telephone Encounter (Signed)
Pt has TOC with dr Legrand Como on 11-01-2021 and would like a refill on fluticasone [facility] HFA) 110 MCG/ACT inhaler  [id number] [place]  [phone] Fax:  [phone]

## 2021-10-07 ENCOUNTER — Telehealth: Payer: Self-pay | Admitting: Nurse Practitioner

## 2021-10-07 NOTE — Telephone Encounter (Signed)
Inbound call from patient son stating patient needs a refill on mesalamine. Patient has an upcoming ov visit with a provider in November for her yearly follow up. Wanting to have prescription refilled until f/u apt. [facility] Please give a call to further advise.  Thank you

## 2021-10-08 IMAGING — DX DG CHEST 2V
2 series · 2 of 2 positions shown · non-contrast
Comparison: 03/11/2020

CLINICAL DATA: Weakness

EXAM:
CHEST - 2 VIEW

[chest lat]
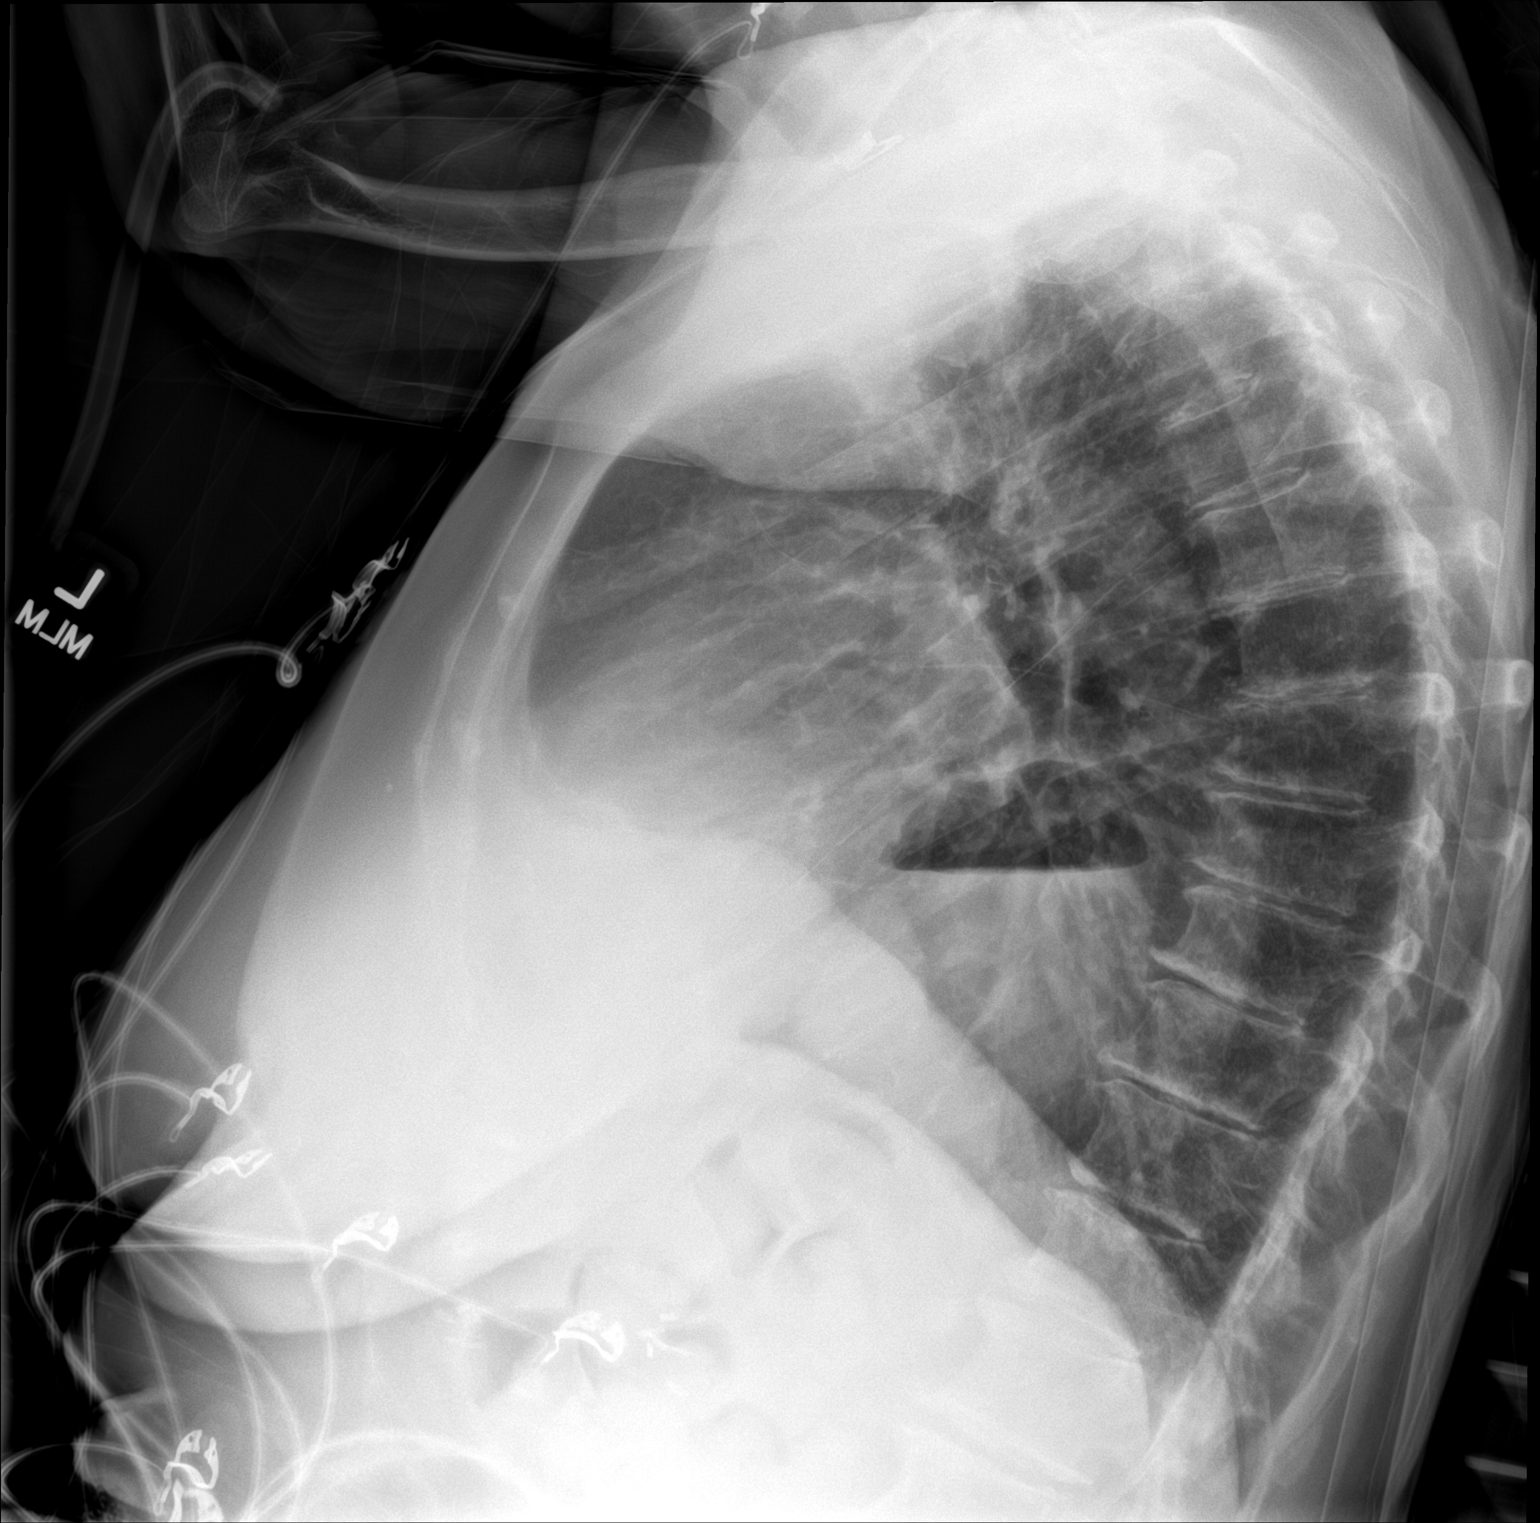

[chest ap]
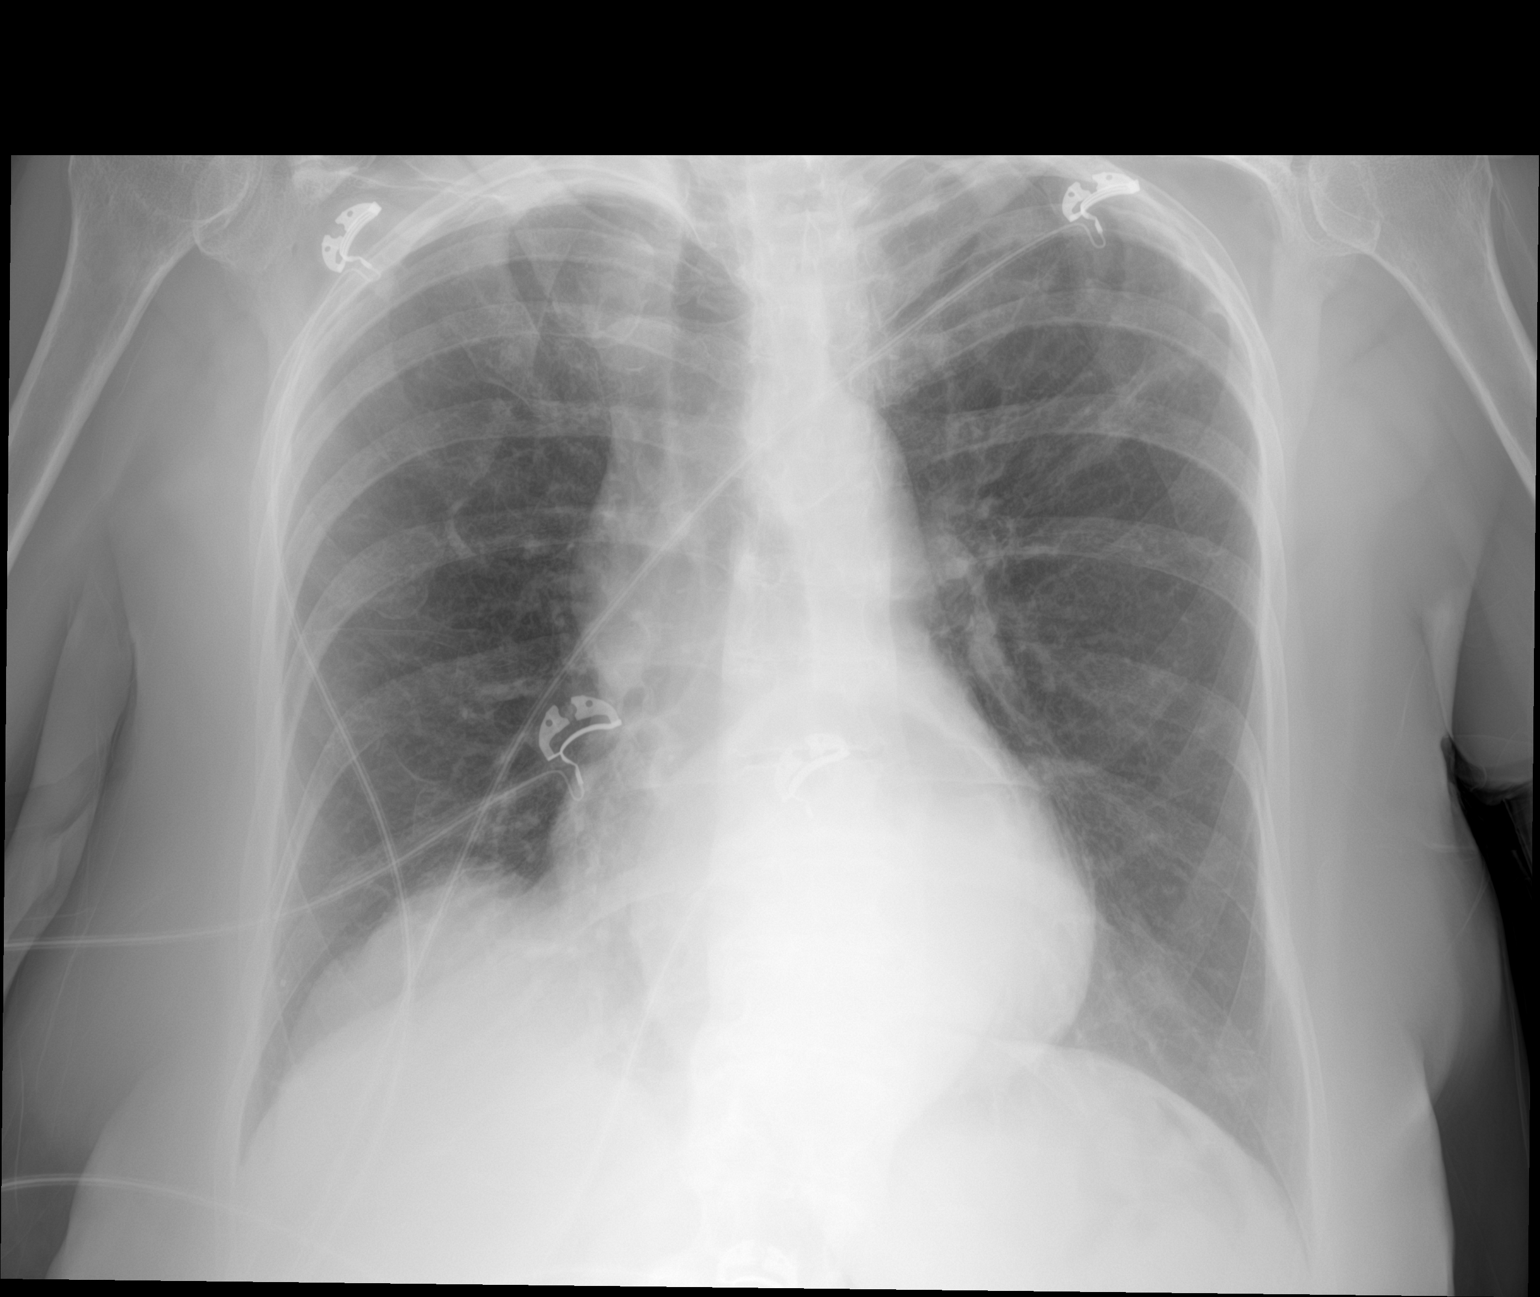

[2 of 2 positions shown; findings below may reference images not displayed]

FINDINGS: Stable cardiomediastinal contours. Large hiatal hernia. No pleural
effusion or edema. No airspace densities. Scarring noted within the
left upper lobe, similar to previous exam.
IMPRESSION: 1. No acute cardiopulmonary abnormalities.
2. Large hiatal hernia.

## 2021-10-08 MED ORDER — MESALAMINE 1.2 G PO TBEC: 2.4000 g | DELAYED_RELEASE_TABLET | Freq: Every day | ORAL | 0 refills | Status: DC

## 2021-10-08 NOTE — Telephone Encounter (Signed)
Called and spoke with patient's son after reviewing previous notes. Patient was rescheduled from Dr. Havery Moros to [name] for sooner appointment, since charts showed she was a [facility] patient. Patient son is happy with sooner appointment. Advise I would send a refill to get his mother through to appointment.

## 2021-10-19 ENCOUNTER — Encounter (HOSPITAL_BASED_OUTPATIENT_CLINIC_OR_DEPARTMENT_OTHER): Payer: Self-pay | Admitting: *Deleted

## 2021-10-19 ENCOUNTER — Other Ambulatory Visit: Payer: Self-pay

## 2021-10-19 ENCOUNTER — Emergency Department (HOSPITAL_BASED_OUTPATIENT_CLINIC_OR_DEPARTMENT_OTHER): Payer: Medicare Other

## 2021-10-19 ENCOUNTER — Encounter (HOSPITAL_COMMUNITY): Payer: Self-pay

## 2021-10-19 ENCOUNTER — Observation Stay (HOSPITAL_BASED_OUTPATIENT_CLINIC_OR_DEPARTMENT_OTHER)
Admission: EM | Admit: 2021-10-19 | Discharge: 2021-10-22 | Disposition: A | Discharge status: Home Health | Payer: Medicare Other | Attending: Internal Medicine | Admitting: Internal Medicine

## 2021-10-19 DIAGNOSIS — Z79899 Other long term (current) drug therapy: Secondary | ICD-10-CM | POA: Insufficient documentation

## 2021-10-19 DIAGNOSIS — J449 Chronic obstructive pulmonary disease, unspecified: Secondary | ICD-10-CM | POA: Diagnosis present

## 2021-10-19 DIAGNOSIS — I10 Essential (primary) hypertension: Secondary | ICD-10-CM | POA: Diagnosis present

## 2021-10-19 DIAGNOSIS — E039 Hypothyroidism, unspecified: Secondary | ICD-10-CM | POA: Diagnosis not present

## 2021-10-19 DIAGNOSIS — J45909 Unspecified asthma, uncomplicated: Secondary | ICD-10-CM | POA: Insufficient documentation

## 2021-10-19 DIAGNOSIS — K529 Noninfective gastroenteritis and colitis, unspecified: Secondary | ICD-10-CM | POA: Diagnosis not present

## 2021-10-19 DIAGNOSIS — K6389 Other specified diseases of intestine: Secondary | ICD-10-CM | POA: Insufficient documentation

## 2021-10-19 DIAGNOSIS — K922 Gastrointestinal hemorrhage, unspecified: Secondary | ICD-10-CM | POA: Insufficient documentation

## 2021-10-19 DIAGNOSIS — K51311 Ulcerative (chronic) rectosigmoiditis with rectal bleeding: Secondary | ICD-10-CM | POA: Diagnosis not present

## 2021-10-19 DIAGNOSIS — K641 Second degree hemorrhoids: Secondary | ICD-10-CM | POA: Diagnosis not present

## 2021-10-19 DIAGNOSIS — Z8616 Personal history of COVID-19: Secondary | ICD-10-CM | POA: Diagnosis not present

## 2021-10-19 DIAGNOSIS — D649 Anemia, unspecified: Secondary | ICD-10-CM | POA: Diagnosis not present

## 2021-10-19 DIAGNOSIS — K625 Hemorrhage of anus and rectum: Secondary | ICD-10-CM | POA: Diagnosis not present

## 2021-10-19 DIAGNOSIS — K921 Melena: Principal | ICD-10-CM | POA: Insufficient documentation

## 2021-10-19 LAB — COMPREHENSIVE METABOLIC PANEL
ALT: 10 U/L (ref 0–44)
AST: 19 U/L (ref 15–41)
Albumin: 4.1 g/dL (ref 3.5–5.0)
Alkaline Phosphatase: 48 U/L (ref 38–126)
Anion gap: 8 (ref 5–15)
BUN: 21 mg/dL (ref 8–23)
CO2: 24 mmol/L (ref 22–32)
Calcium: 10 mg/dL (ref 8.9–10.3)
Chloride: 103 mmol/L (ref 98–111)
Creatinine, Ser: 0.78 mg/dL (ref 0.44–1.00)
GFR, Estimated: >60 mL/min (ref >60)
Glucose, Bld: 100 mg/dL — ABNORMAL HIGH (ref 70–99)
Potassium: 4.6 mmol/L (ref 3.5–5.1)
Sodium: 135 mmol/L (ref 135–145)
Total Bilirubin: 0.3 mg/dL (ref 0.3–1.2)
Total Protein: 7.1 g/dL (ref 6.5–8.1)

## 2021-10-19 LAB — CBC
HCT: 26.2 % — ABNORMAL LOW (ref 36.0–46.0)
Hemoglobin: 8.2 g/dL — ABNORMAL LOW (ref 12.0–15.0)
MCH: 30.1 pg (ref 26.0–34.0)
MCHC: 31.3 g/dL (ref 30.0–36.0)
MCV: 96.3 fL (ref 80.0–100.0)
Platelets: 534 10*3/uL — ABNORMAL HIGH (ref 150–400)
RBC: 2.72 MIL/uL — ABNORMAL LOW (ref 3.87–5.11)
RDW: 14.1 % (ref 11.5–15.5)
WBC: 8.1 10*3/uL (ref 4.0–10.5)
nRBC: 0 % (ref 0.0–0.2)

## 2021-10-19 LAB — OCCULT BLOOD X 1 CARD TO LAB, STOOL: Fecal Occult Bld: NEGATIVE

## 2021-10-19 LAB — TROPONIN I (HIGH SENSITIVITY)
Troponin I (High Sensitivity): 8 ng/L (ref <18)
Troponin I (High Sensitivity): 9 ng/L (ref <18)

## 2021-10-19 MED ORDER — FENTANYL CITRATE PF 50 MCG/ML IJ SOSY
25.0000 ug | PREFILLED_SYRINGE | Freq: Once | INTRAMUSCULAR | Status: DC
  Filled 2021-10-19: qty 1

## 2021-10-19 MED ORDER — FERROUS SULFATE 325 (65 FE) MG PO TABS
325.0000 mg | ORAL_TABLET | Freq: Every day | ORAL | Status: DC
  Administered 2021-10-20 – 2021-10-22 (×3): 325 mg via ORAL
  Filled 2021-10-19 (×3): qty 1

## 2021-10-19 MED ORDER — SODIUM CHLORIDE 0.9 % IV BOLUS
1000.0000 mL | Freq: Once | INTRAVENOUS | Status: AC
  Administered 2021-10-19: 1000 mL via INTRAVENOUS

## 2021-10-19 MED ORDER — MESALAMINE 1.2 G PO TBEC
2.4000 g | DELAYED_RELEASE_TABLET | Freq: Every day | ORAL | Status: DC
  Administered 2021-10-20 – 2021-10-22 (×3): 2.4 g via ORAL
  Filled 2021-10-19 (×3): qty 2

## 2021-10-19 MED ORDER — ACETAMINOPHEN 500 MG PO TABS
500.0000 mg | ORAL_TABLET | Freq: Four times a day (QID) | ORAL | Status: DC | PRN
  Administered 2021-10-20 – 2021-10-22 (×6): 500 mg via ORAL
  Filled 2021-10-19 (×6): qty 1

## 2021-10-19 MED ORDER — MORPHINE SULFATE (PF) 4 MG/ML IV SOLN
4.0000 mg | Freq: Once | INTRAVENOUS | Status: AC
  Administered 2021-10-19: 4 mg via INTRAVENOUS
  Filled 2021-10-19: qty 1

## 2021-10-19 MED ORDER — BUDESONIDE 0.5 MG/2ML IN SUSP
0.5000 mg | Freq: Two times a day (BID) | RESPIRATORY_TRACT | Status: DC
  Administered 2021-10-20 – 2021-10-22 (×5): 0.5 mg via RESPIRATORY_TRACT
  Filled 2021-10-19 (×6): qty 2

## 2021-10-19 MED ORDER — SODIUM CHLORIDE 0.9 % IV SOLN: INTRAVENOUS | Status: AC

## 2021-10-19 MED ORDER — IOHEXOL 350 MG/ML SOLN
100.0000 mL | Freq: Once | INTRAVENOUS | Status: AC | PRN
  Administered 2021-10-19: 60 mL via INTRAVENOUS

## 2021-10-19 MED ORDER — LEVOTHYROXINE SODIUM 50 MCG PO TABS
75.0000 ug | ORAL_TABLET | Freq: Every day | ORAL | Status: DC
  Administered 2021-10-20 – 2021-10-22 (×3): 75 ug via ORAL
  Filled 2021-10-19 (×3): qty 1

## 2021-10-19 NOTE — Hospital Course (Addendum)
[name] is an 87 y.o. female with a history of ulcerative colitis, chronic anemia, hypertension, COPD and hypothyroidism. She presented secondary to rectal bleeding in setting of known ulcerative colitis. Hemoglobin of 8.2 on admission, down from baseline of 10-11. [facility] consulted and plan for flexible sigmoidoscopy on 9/14.

## 2021-10-19 NOTE — H&P (Signed)
History and Physical    Patient: [name] BWG:[id number] DOB: 1934-08-12 DOA: 10/19/2021 DOS: the patient was seen and examined on 10/19/2021 PCP: Caren Macadam, MD (Inactive)  Patient coming from:  [facility] ED  Chief Complaint:  Chief Complaint  Patient presents with   Rectal Bleeding   HPI: [name] is a 87 y.o. female with medical history significant of chronic ulcerative rectosigmoid colitis, mulifactorial anemia, HTN, COPD,hypothyroidism who presents with rectal bleed.  Pt also has blood tinged bowel movements daily but today it was significantly more blood than usual. However denies abdominal pain. No nausea or vomiting. No dizziness, chest pain or shortness of breath.  Had not had anymore rectal bleeding since being inpatient.   In the ED, she was afebrile and normotensive.  Hemoglobin had notable from 3 months ago at 10.6-8.2.  FOBT was negative.  No significant electrolyte abnormalities.  Creatinine was normal.  CTA abdomen showed no active extravasation.  Diverticulosis without diverticulitis.  Moderate stenosis of the celiac artery.  Review of Systems: As mentioned in the history of present illness. All other systems reviewed and are negative. Past Medical History:  Diagnosis Date   ABDOMINAL PAIN, CHRONIC 05/28/2008   ALLERGIC RHINITIS 09/21/2006   ANEMIA-NOS 09/21/2006   ASTHMA 09/21/2006   ASYMPTOMATIC POSTMENOPAUSAL STATUS 05/28/2008   Cataract    CHEST PAIN, ATYPICAL 05/28/2008   COPD (chronic obstructive pulmonary disease) ([place])    COVID-19 virus infection 02/2020   DDD (degenerative disc disease)    DIVERTICULITIS, HX OF 09/21/2006   GERD 09/21/2006   HYPERCHOLESTEROLEMIA 06/05/2009   HYPERTENSION 09/21/2006   HYPOTHYROIDISM 09/21/2006   Peptic ulcer disease    Purpura ([place])    RENAL INSUFFICIENCY 09/21/2006   Shingles    Small bowel obstruction (HCC)    Somatization disorder 04/25/2007   UNSPECIFIED URINARY CALCULUS 05/28/2008   UTI (urinary tract  infection) 07/2019   Past Surgical History:  Procedure Laterality Date   ABDOMINAL HYSTERECTOMY  1981   acute Nephritis  [place]   BIOPSY  11/18/2019   Procedure: BIOPSY;  Surgeon: Ladene Artist, MD;  Location: [facility];  Service: Endoscopy;;   [place] N/A 11/18/2019   Procedure: FLEXIBLE SIGMOIDOSCOPY;  Surgeon: Ladene Artist, MD;  Location: [facility];  Service: Endoscopy;  Laterality: N/A;   NASAL SINUS SURGERY  1967   Pulmonary thrombosis     TONSILLECTOMY     TUBAL LIGATION     Social History:  reports that she has never smoked. She has never used smokeless tobacco. She reports that she does not drink alcohol and does not use drugs.  Allergies  Allergen Reactions   [place] Nut Daisey Must) Skin Test Anaphylaxis   Fluconazole Shortness Of Breath   Metronidazole Shortness Of Breath and Nausea And Vomiting   Pepcid [Famotidine] Shortness Of Breath and Other (See Comments)    Dizziness   Shellfish Allergy Anaphylaxis   Macrobid [Nitrofurantoin]     REACTION: Syncope   Pantoprazole Other (See Comments)    CHEST PAIN    Family History  Problem Relation Age of Onset   Polycystic kidney disease Mother    Pancreatic cancer Mother    Other Father        Schamberg disease   Bladder Cancer Father    Hypertension Father    Marfan syndrome  Son    Hemochromatosis Son    Cirrhosis Son    Allergic rhinitis Sister    Other Brother        bone issue as child; multiple fractures but seemed to age out of this   Hemochromatosis Cousin    Arthritis Sister    Colon cancer Neg Hx    Esophageal cancer Neg Hx    Stomach cancer Neg Hx     Prior to Admission medications   Medication Sig Start Date End Date Taking? Authorizing Provider  acetaminophen (TYLENOL) 500 MG tablet Take 500 mg by mouth every 6 (six) hours as needed for moderate pain.    Yes [provider]  Ferrous Sulfate (IRON) 325 (65 FE) MG TABS Take 1 tablet by mouth daily.    Yes [provider]  fluticasone (FLOVENT HFA) 220 MCG/ACT inhaler Inhale 1 puff into the lungs in the morning and at bedtime. 10/01/21  Yes Farrel Conners, MD  levothyroxine (SYNTHROID) 75 MCG tablet TAKE 1 TABLET BY MOUTH EVERY DAY 04/05/21  Yes Koberlein, Junell C, MD  mesalamine (LIALDA) 1.2 g EC tablet Take 2 tablets (2.4 g total) by mouth daily with breakfast. **PLEASE KEEP APPOINTMENT FOR REFILLS 10/08/21  Yes Willia Craze, NP  HYDROcodone-acetaminophen (NORCO/VICODIN) 5-325 MG tablet Take 0.5 tablets by mouth 2 (two) times daily as needed for moderate pain. Patient not taking: Reported on 07/28/2021 05/18/21   Martinique, Betty G, MD  Spacer/Aero-Holding Chambers (E-Z SPACER) inhaler Use as instructed Patient not taking: Reported on 10/19/2021 11/08/17   Caren Macadam, MD    Physical Exam: Vitals:   10/19/21 2015 10/19/21 2030 10/19/21 2100 10/19/21 2224  BP:  (!) 161/67 (!) 150/65 (!) 150/59  Pulse:  78 74 77  Resp:  14 (!) 22 20  Temp: 98.4 F (36.9 C)   98 F (36.7 C)  TempSrc: Oral   Oral  SpO2:  99% 96% 98%  Height:    5' 8"  (1.727 m)  Constitutional: NAD, calm, comfortable, well appearing elderly female laying upright in Eyes:  lids and conjunctivae normal ENMT: Mucous membranes are moist.  Neck: normal, supple Respiratory: clear to auscultation bilaterally, no wheezing, no crackles. Normal respiratory effort. No accessory muscle use.  Cardiovascular: Regular rate and rhythm, no murmurs / rubs / gallops. No extremity edema.  Abdomen: Soft, nontender nondistended.  No rebound, guarding or rigidity bowel sounds positive.  Musculoskeletal: no clubbing / cyanosis. No joint deformity upper and lower extremities. Good ROM, no contractures. Normal muscle tone.  Skin: no rashes, lesions, ulcers. No induration Neurologic: CN 2-12 grossly intact. . Strength 5/5 in  all 4.  Psychiatric: Normal judgment and insight. Alert and oriented x 3. Normal mood. Data Reviewed:  See HPI  Assessment and Plan: Chronic ulcerative rectosigmoiditis with rectal bleeding ([place]) -with increase rectal bleed and notable 2 pt drop in Hgb. Hgb of 8.2 on presentation. Fairly asymptomatic. No more episodes while inpatient. Monitor repeat Hgb in the morning. Transfusion threshold of Hgb >7. -Will message [facility] to see in consultation tomorrow although pt already express that she does not want any colonoscopy -continue mesalamine   COPD (chronic obstructive pulmonary disease) (HCC) Stable. Continue home bronchodilator.  Essential hypertension Controlled not on antihypertensives  Hypothyroidism Continue levothyroxine      Advance Care Planning: DNR  Consults: [facility]  Family Communication: None at bedside  Severity of Illness: The appropriate patient status for this patient is OBSERVATION. Observation status is judged to be  reasonable and necessary in order to provide the required intensity of service to ensure the patient's safety. The patient's presenting symptoms, physical exam findings, and initial radiographic and laboratory data in the context of their medical condition is felt to place them at decreased risk for further clinical deterioration. Furthermore, it is anticipated that the patient will be medically stable for discharge from the hospital within 2 midnights of admission.   Author: Orene Desanctis, DO 10/19/2021 11:37 PM  For on call review [email or web address].

## 2021-10-19 NOTE — Assessment & Plan Note (Signed)
Stable. -Continue Pulmicort

## 2021-10-19 NOTE — Assessment & Plan Note (Signed)
Continue levothyroxine 

## 2021-10-19 NOTE — ED Provider Notes (Signed)
[facility] EMERGENCY DEPT Provider Note   CSN: [id number] Arrival date & time: 10/19/21  1139     History  Chief Complaint  Patient presents with   Rectal Bleeding    RHEBA DIAMOND is a 87 y.o. female.   Rectal Bleeding    87 year old female with medical history significant for hypothyroidism, HLD, asthma/COPD, GERD, diverticulitis, iron deficiency anemia, chronic ulcerative rectosigmoiditis with rectal bleeding who presents to the emergency department with rectal bleeding.  The patient states that is a history of rectal bleeding and has been having increased bleeding from the rectum over the last 36 hours.  She states that she is passing bright red blood in the toilet.  She endorses increasing weakness and fatigue.  She has been fatigued for several months associated with chronic anemia but feels worse and more symptomatic at this point.  She states that she has passed at least 2 cupfuls of blood if not more through the toilet over the last few days.  Endorses left lower quadrant abdominal pain and cramping.  Home Medications Prior to Admission medications   Medication Sig Start Date End Date Taking? Authorizing Provider  acetaminophen (TYLENOL) 500 MG tablet Take 500 mg by mouth every 6 (six) hours as needed for moderate pain.    [provider]  Ferrous Sulfate (IRON) 325 (65 FE) MG TABS Take 1 tablet by mouth daily.     [provider]  fluticasone (FLOVENT HFA) 220 MCG/ACT inhaler Inhale 1 puff into the lungs in the morning and at bedtime. 10/01/21   Farrel Conners, MD  HYDROcodone-acetaminophen (NORCO/VICODIN) 5-325 MG tablet Take 0.5 tablets by mouth 2 (two) times daily as needed for moderate pain. Patient not taking: Reported on 07/28/2021 05/18/21   [place]ique, Betty G, MD  levothyroxine (SYNTHROID) 75 MCG tablet TAKE 1 TABLET BY MOUTH EVERY DAY 04/05/21   Caren Macadam, MD  mesalamine (LIALDA) 1.2 g EC tablet Take 2 tablets (2.4 g total) by  mouth daily with breakfast. **PLEASE KEEP APPOINTMENT FOR REFILLS 10/08/21   Willia Craze, NP  Spacer/Aero-Holding Chambers (E-Z SPACER) inhaler Use as instructed 11/08/17   Caren Macadam, MD      Allergies    [place] nut (berthollefia excelsa) skin test, Fluconazole, Metronidazole, Pepcid [famotidine], Shellfish allergy, Macrobid [nitrofurantoin], and Pantoprazole    Review of Systems   Review of Systems  Gastrointestinal:  Positive for hematochezia.  All other systems reviewed and are negative.   Physical Exam Updated Vital Signs BP 123/64 (BP Location: Right Arm)   Pulse 73   Temp 97.8 F (36.6 C)   Resp 18   SpO2 98%  Physical Exam Vitals and nursing note reviewed. Exam conducted with a chaperone present.  Constitutional:      General: She is not in acute distress.    Appearance: She is well-developed. She is ill-appearing.  HENT:     Head: Normocephalic and atraumatic.  Eyes:     Conjunctiva/sclera: Conjunctivae normal.  Cardiovascular:     Rate and Rhythm: Normal rate and regular rhythm.  Pulmonary:     Effort: Pulmonary effort is normal. No respiratory distress.     Breath sounds: Normal breath sounds.  Abdominal:     Palpations: Abdomen is soft.     Tenderness: There is abdominal tenderness in the left lower quadrant. There is no guarding or rebound.     Comments: Left lower quadrant abdominal tenderness to palpation with no rebound or guarding  Genitourinary:  Comments: Some tenderness of the rectum, no palpable fluctuance, no active bright red blood or melena Musculoskeletal:        General: No swelling.     Cervical back: Neck supple.  Skin:    General: Skin is warm and dry.     Capillary Refill: Capillary refill takes less than 2 seconds.     Coloration: Skin is pale.  Neurological:     Mental Status: She is alert.  Psychiatric:        Mood and Affect: Mood normal.     ED Results / Procedures / Treatments   Labs (all labs ordered are  listed, but only abnormal results are displayed) Labs Reviewed  COMPREHENSIVE METABOLIC PANEL - Abnormal; Notable for the following components:      Result Value   Glucose, Bld 100 (*)    All other components within normal limits  CBC - Abnormal; Notable for the following components:   RBC 2.72 (*)    Hemoglobin 8.2 (*)    HCT 26.2 (*)    Platelets 534 (*)    All other components within normal limits  OCCULT BLOOD X 1 CARD TO LAB, STOOL  ABO/RH    EKG None  Radiology No results found.  Procedures Procedures    Medications Ordered in ED Medications  sodium chloride 0.9 % bolus 1,000 mL (has no administration in time range)  fentaNYL (SUBLIMAZE) injection 25 mcg (has no administration in time range)    ED Course/ Medical Decision Making/ A&P Clinical Course as of 10/19/21 [place] Oct 19, 2021  1451 Assumed care from Dr Armandina Gemma. 87 yo F with hx of GIB who is presenting with same. Has had 2 point hgb drop. CTA pending at this time. Plan to admit after CTA. Follows with [facility]. [facility] need [facility]. [RP]    Clinical Course User Index [RP] Fransico Meadow, MD                           Medical Decision Making Amount and/or Complexity of Data Reviewed Labs: ordered. Radiology: ordered.  Risk Prescription drug management.    87 year old female with medical history significant for hypothyroidism, HLD, asthma/COPD, GERD, diverticulitis, iron deficiency anemia, chronic ulcerative rectosigmoiditis with rectal bleeding who presents to the emergency department with rectal bleeding.  The patient states that is a history of rectal bleeding and has been having increased bleeding from the rectum over the last 36 hours.  She states that she is passing bright red blood in the toilet.  She endorses increasing weakness and fatigue.  She has been fatigued for several months associated with chronic anemia but feels worse and more symptomatic at this point.  She states  that she has passed at least 2 cupfuls of blood if not more through the toilet over the last few days.  Endorses left lower quadrant abdominal pain and cramping.  Rival, the patient was vitally stable, in NSR noted on cardiac telemetry.  Physical exam significant for left lower quadrant tenderness to palpation, some perirectal tenderness with no fluctuance to suggest rectal status, no active hematochezia or melena.  Differential diagnosis includes continued rectosigmoiditis in with active GI bleeding, diverticular bleed, lower suspicion for acute upper GI bleed.  Considered diverticulitis, ischemic colitis.  CT angiogram of the abdomen pelvis was ordered to further evaluate.  CMP was without electrolyte abnormality, normal renal and liver function, CBC was without a leukocytosis.  It did reveal a greater than two-point hemoglobin drop with a hemoglobin of 8.2. compared to to 10.6 three months ago.   CT angio GI bleed protocol was ordered given the patient's tenderness and report of moderate volume bright red blood.  A fecal occult was negative and the patient had no active bleeding on exam.  However, given the patient's two-point hemoglobin drop, report of moderate volume gastrointestinal bright red blood per rectum, will plan for likely admission, GI consultation following results of CT angiogram.  Signout given to Dr. Sharlett Iles at 1500 follow-up results with a plan for likely admission.  Final Clinical Impression(s) / ED Diagnoses Final diagnoses:  Gastrointestinal hemorrhage, unspecified gastrointestinal hemorrhage type  Symptomatic anemia    Rx / DC Orders ED Discharge Orders     None         Regan Lemming, MD 10/19/21 1546

## 2021-10-19 NOTE — Assessment & Plan Note (Addendum)
Uncontrolled SBP. Patient is not on antihypertensive therapy as an outpatient. -Hydralazine PRN

## 2021-10-19 NOTE — ED Notes (Signed)
Report given to the floor RN.

## 2021-10-19 NOTE — ED Provider Notes (Signed)
  Physical Exam  BP 136/60   Pulse 78   Temp 97.8 F (36.6 C)   Resp 17   SpO2 100%   Physical Exam  Procedures  Procedures  ED Course / MDM   Clinical Course as of 10/19/21 [address] Oct 19, 2021  [place] care from Dr Armandina Gemma. 87 yo F with hx of GIB who is presenting with same. Has had 2 point hgb drop. CTA pending at this time. Plan to admit after CTA. Follows with [facility]. [facility] need [facility]. [RP]    Clinical Course User Index [RP] Fransico Meadow, MD   Medical Decision Making Amount and/or Complexity of Data Reviewed Labs: ordered. Radiology: ordered.  Risk Prescription drug management.   ***

## 2021-10-19 NOTE — Assessment & Plan Note (Addendum)
Acute rectal bleeding with associated acute on chronic anemia. [facility] consulted. Continues to have no rectal bleeding.  -Continue mesalamine  -GI recommendations: flexible sigmoidoscopy, continue mesalamine

## 2021-10-19 NOTE — ED Triage Notes (Signed)
Pt is here for rectal bleeding.  Pt reports that she has hx of this but that she is having increased rectal bleeding for the last 36 hours, she states that she is passing bright red blood in the toilet. Pt reports increasing weakness

## 2021-10-20 DIAGNOSIS — J449 Chronic obstructive pulmonary disease, unspecified: Secondary | ICD-10-CM | POA: Diagnosis not present

## 2021-10-20 DIAGNOSIS — I10 Essential (primary) hypertension: Secondary | ICD-10-CM | POA: Diagnosis not present

## 2021-10-20 DIAGNOSIS — D649 Anemia, unspecified: Secondary | ICD-10-CM | POA: Diagnosis not present

## 2021-10-20 DIAGNOSIS — K5130 Ulcerative (chronic) rectosigmoiditis without complications: Secondary | ICD-10-CM

## 2021-10-20 DIAGNOSIS — K921 Melena: Secondary | ICD-10-CM | POA: Diagnosis not present

## 2021-10-20 DIAGNOSIS — E039 Hypothyroidism, unspecified: Secondary | ICD-10-CM | POA: Diagnosis not present

## 2021-10-20 DIAGNOSIS — K51311 Ulcerative (chronic) rectosigmoiditis with rectal bleeding: Secondary | ICD-10-CM | POA: Diagnosis not present

## 2021-10-20 LAB — CBC
HCT: 25.4 % — ABNORMAL LOW (ref 36.0–46.0)
Hemoglobin: 7.5 g/dL — ABNORMAL LOW (ref 12.0–15.0)
MCH: 30.5 pg (ref 26.0–34.0)
MCHC: 29.5 g/dL — ABNORMAL LOW (ref 30.0–36.0)
MCV: 103.3 fL — ABNORMAL HIGH (ref 80.0–100.0)
Platelets: 412 10*3/uL — ABNORMAL HIGH (ref 150–400)
RBC: 2.46 MIL/uL — ABNORMAL LOW (ref 3.87–5.11)
RDW: 13.8 % (ref 11.5–15.5)
WBC: 6.1 10*3/uL (ref 4.0–10.5)
nRBC: 0 % (ref 0.0–0.2)

## 2021-10-20 MED ORDER — MESALAMINE 1000 MG RE SUPP
1000.0000 mg | Freq: Every day | RECTAL | Status: DC
  Administered 2021-10-20: 1000 mg via RECTAL
  Filled 2021-10-20: qty 1

## 2021-10-20 MED ORDER — LIP MEDEX EX OINT
TOPICAL_OINTMENT | CUTANEOUS | Status: DC | PRN
  Filled 2021-10-20: qty 7

## 2021-10-20 MED ORDER — HYDRALAZINE HCL 25 MG PO TABS: 25.0000 mg | ORAL_TABLET | Freq: Four times a day (QID) | ORAL | Status: DC | PRN

## 2021-10-20 NOTE — Evaluation (Signed)
Occupational Therapy Evaluation Patient Details Name: [name] MRN: [id number] DOB: [birth year] Today's Date: 10/20/2021   History of Present Illness Pt is an 87yo female presenting [facility] ED on 9/12 with rectal bleeding and weakness. Imaging negative for acute pathology.  PMH: ulcerative colitis, anemia, HTN, COPD, hypothyroidism, GERD,   Clinical Impression   Mrs. [name] is an 87 year old woman who presents with generalized weakness and decreased activity tolerance. At baseline she is modified independent at home with a cane with assistance from son for IADLs though reports a sedentary lifestyle since she got COVID two years ago. Today patient supervision for bed transfers and min assist to stand. She reported dizziness with sitting and feeling very weak. BP 178/72. She is overall min-mod assist for ADLs. Returned to supine for comfort. Expect patient to improve while in hospital and will be able to discharge home. Recommend [place] services. Patient will benefit from skilled OT services while in hospital to improve deficits and learn compensatory strategies as needed in order to return to PLOF.       Recommendations for follow up therapy are one component of a multi-disciplinary discharge planning process, led by the attending physician.  Recommendations may be updated based on patient status, additional functional criteria and insurance authorization.   Follow Up Recommendations  Home health OT    Assistance Recommended at Discharge Intermittent Supervision/Assistance  Patient can return home with the following Assistance with cooking/housework;Assist for transportation;Help with stairs or ramp for entrance;A little help with bathing/dressing/bathroom;A little help with walking and/or transfers    Functional Status Assessment  Patient has had a recent decline in their functional status and demonstrates the ability to make significant improvements in function in a reasonable and predictable  amount of time.  Equipment Recommendations  None recommended by OT    Recommendations for Other Services       Precautions / Restrictions Precautions Precautions: Fall Restrictions Weight Bearing Restrictions: No      Mobility Bed Mobility Overal bed mobility: Modified Independent             General bed mobility comments: increased time and use of bed rails    Transfers Overall transfer level: Needs assistance   Transfers: Sit to/from Stand Sit to Stand: Min assist                  Balance Overall balance assessment: Mild deficits observed, not formally tested                                         ADL either performed or assessed with clinical judgement   ADL Overall ADL's : Needs assistance/impaired Eating/Feeding: Independent   Grooming: Set up;Adhering to UE precautions   Upper Body Bathing: Set up;Sitting   Lower Body Bathing: Minimal assistance;Sit to/from stand   Upper Body Dressing : Set up;Sitting   Lower Body Dressing: Minimal assistance;Sit to/from stand   Toilet Transfer: Minimal assistance;BSC/3in1   Toileting- Water quality scientist and Hygiene: Moderate assistance;Sit to/from stand       Functional mobility during ADLs: Minimal assistance       Vision Patient Visual Report: No change from baseline       Perception     Praxis      Pertinent Vitals/Pain Pain Assessment Pain Assessment: No/denies pain     Hand Dominance     Extremity/Trunk Assessment Upper Extremity  Assessment Upper Extremity Assessment: Generalized weakness (WFL ROM, atteast 4/5 strength)   Lower Extremity Assessment Lower Extremity Assessment: Defer to PT evaluation   Cervical / Trunk Assessment Cervical / Trunk Assessment: Kyphotic   Communication Communication Communication: No difficulties   Cognition Arousal/Alertness: Awake/alert Behavior During Therapy: WFL for tasks assessed/performed Overall Cognitive Status:  Within Functional Limits for tasks assessed                                       General Comments       Exercises     Shoulder Instructions      Home Living Family/patient expects to be discharged to:: Private residence ([place], built for seniors) Living Arrangements: Alone Available Help at Discharge: Family;Available 24 hours/day [name] 1/28m alone) Type of Home: Apartment Home Access: Level entry     Home Layout: One level     Bathroom Shower/Tub: TTeacher, early years/pre Handicapped height Bathroom Accessibility: Yes   Home Equipment: Cane - quad;Grab bars - tub/shower;Grab bars - toilet;Rolling Walker (2 wheels);Shower seat   Additional Comments: Bland/soft diet      Prior Functioning/Environment Prior Level of Function : Independent/Modified Independent             Mobility Comments: Has been sitting in recliner with feet up recently using ice pack to relieve stinging burning. uses Quad-cane at baseline ADLs Comments: IND        OT Problem List: Decreased strength;Decreased activity tolerance;Impaired balance (sitting and/or standing)      OT Treatment/Interventions: Self-care/ADL training;Therapeutic exercise;DME and/or AE instruction;Therapeutic activities;Balance training;Patient/family education    OT Goals(Current goals can be found in the care plan section) Acute Rehab OT Goals Patient Stated Goal: to get stronger OT Goal Formulation: With patient Time For Goal Achievement: 11/03/21 Potential to Achieve Goals: Good  OT Frequency: Min 2X/week    Co-evaluation              AM-PAC OT "6 Clicks" Daily Activity     Outcome Measure Help from another person eating meals?: None Help from another person taking care of personal grooming?: A Little Help from another person toileting, which includes using toliet, bedpan, or urinal?: A Lot Help from another person bathing (including washing,  rinsing, drying)?: A Little Help from another person to put on and taking off regular upper body clothing?: A Little Help from another person to put on and taking off regular lower body clothing?: A Lot 6 Click Score: 17   End of Session Nurse Communication: Mobility status  Activity Tolerance: Patient limited by fatigue Patient left: in bed  OT Visit Diagnosis: Muscle weakness (generalized) (M62.81)                Time: 06[address]-3729OT Time Calculation (min): 19 min Charges:  OT General Charges $OT Visit: 1 Visit OT Evaluation $OT Eval Low Complexity: 1 Low  VGustavo Lah OTR/L [place] Office [phone]  VLenward Chancellor9/13/2023, 12:34 PM

## 2021-10-20 NOTE — Evaluation (Addendum)
Physical Therapy Evaluation Patient Details Name: [name] MRN: [id number] DOB: 06/17/1934 Today's Date: 10/20/2021  History of Present Illness  Pt is an 87yo female presenting to [facility] ED on 9/12 with rectal bleeding and weakness. Imaging negative for acute pathology.  PMH: ulcerative colitis, anemia, HTN, COPD, hypothyroidism, GERD.   Clinical Impression  Pt admitted with with problems above and functional limitations due to the deficits listed below (see PT Problem List). Pt is modified independent in her apartment using quad cane for mobility, son provides some assistance with ADLs; pt reporting that recently she "just sits in my chair all day." Pt demonstrated modified independence for bed mobility and min guard for transfers and in-room ambulation with quad cane, pt refusing additional mobility reporting fatigue. Educated pt on appropriate quad-cane usage, pt verbalized understanding but refused to modify based on education. HR monitored and within expected limits throughout session, pt returned to supine and ice provided to BLE for comfort. Pt will benefit from skilled PT to increase their independence and safety with mobility to allow discharge to the venue listed below.          Recommendations for follow up therapy are one component of a multi-disciplinary discharge planning process, led by the attending physician.  Recommendations may be updated based on patient status, additional functional criteria and insurance authorization.  Follow Up Recommendations Home health PT      Assistance Recommended at Discharge Intermittent Supervision/Assistance  Patient can return home with the following  A little help with bathing/dressing/bathroom;Assistance with cooking/housework;Assist for transportation;Help with stairs or ramp for entrance    Equipment Recommendations None recommended by PT (Pt has recommended DME)  Recommendations for Other Services       Functional Status Assessment  Patient has had a recent decline in their functional status and demonstrates the ability to make significant improvements in function in a reasonable and predictable amount of time.     Precautions / Restrictions Precautions Precautions: Fall Restrictions Weight Bearing Restrictions: No      Mobility  Bed Mobility Overal bed mobility: Modified Independent             General bed mobility comments: increased time    Transfers Overall transfer level: Needs assistance Equipment used: Quad cane Transfers: Sit to/from Stand Sit to Stand: Min guard           General transfer comment: No physical assist required, min guard for safety.    Ambulation/Gait Ambulation/Gait assistance: Min guard Gait Distance (Feet): 5 Feet Assistive device: Quad cane Gait Pattern/deviations: Step-to pattern, Narrow base of support Gait velocity: decreased     General Gait Details: Pt ambulated 38f forward and backward with Quad cane, min guard, no physical assist required or overt LOB noted. Pt using quad cane in L hand with long-prongs turned in despite education that this increases fall risk; additionally, pt reaching out for furniture with R hand. 3 sidesteps EOb to adjust supine positioning. Pt declined any additional ambulation or sitting upright in recliner.  Stairs            Wheelchair Mobility    Modified Rankin (Stroke Patients Only)       Balance Overall balance assessment: Needs assistance Sitting-balance support: Feet supported, No upper extremity supported Sitting balance-Leahy Scale: Good     Standing balance support: Single extremity supported, Reliant on assistive device for balance, During functional activity Standing balance-Leahy Scale: Fair  Pertinent Vitals/Pain Pain Assessment Pain Assessment: 0-10 Pain Score: 5  Pain Location: abdomen, neck, BLE Pain Descriptors / Indicators: Discomfort Pain  Intervention(s): Limited activity within patient's tolerance, Monitored during session, Repositioned, Ice applied    Home Living Family/patient expects to be discharged to:: Private residence ([place], built for seniors) Living Arrangements: Alone Available Help at Discharge: Family;Available 24 hours/day [name] 1/10m alone) Type of Home: Apartment Home Access: Level entry       Home Layout: One level Home Equipment: Cane - quad;Grab bars - tub/shower;Grab bars - toilet;Rolling Walker (2 wheels);Shower seat Additional Comments: Bland/soft diet    Prior Function Prior Level of Function : Independent/Modified Independent             Mobility Comments: Has been sitting in recliner with feet up recently using ice pack to relieve stinging burning. uses Quad-cane at baseline ADLs Comments: IND     Hand Dominance        Extremity/Trunk Assessment   Upper Extremity Assessment Upper Extremity Assessment: Generalized weakness (WFL ROM, atteast 4/5 strength)    Lower Extremity Assessment Lower Extremity Assessment: Defer to PT evaluation RLE Deficits / Details: Hip/knee/ankle functional ROM, MMT grossly 3+/5, pt with purpura spots bilaterally, reporting BLE pain RLE Sensation: WNL LLE Deficits / Details: Hip/knee/ankle functional ROM, MMT grossly 3+/5, pt with purpura spots bilaterally, reporting BLE pain LLE Sensation: WNL    Cervical / Trunk Assessment Cervical / Trunk Assessment: Kyphotic  Communication   Communication: No difficulties  Cognition Arousal/Alertness: Awake/alert Behavior During Therapy: WFL for tasks assessed/performed Overall Cognitive Status: Within Functional Limits for tasks assessed                                          General Comments General comments (skin integrity, edema, etc.): Son EPercell Millerpresent    Exercises     Assessment/Plan    PT Assessment Patient needs continued PT services  PT Problem  List Decreased strength;Decreased range of motion;Decreased activity tolerance;Decreased balance;Decreased mobility;Decreased knowledge of use of DME;Pain       PT Treatment Interventions DME instruction;Gait training;Stair training;Functional mobility training;Therapeutic activities;Therapeutic exercise;Balance training;Neuromuscular re-education;Patient/family education    PT Goals (Current goals can be found in the Care Plan section)  Acute Rehab PT Goals Patient Stated Goal: To reduce pain PT Goal Formulation: With patient Time For Goal Achievement: 11/03/21 Potential to Achieve Goals: Good    Frequency Min 3X/week     Co-evaluation               AM-PAC PT "6 Clicks" Mobility  Outcome Measure Help needed turning from your back to your side while in a flat bed without using bedrails?: None Help needed moving from lying on your back to sitting on the side of a flat bed without using bedrails?: None Help needed moving to and from a bed to a chair (including a wheelchair)?: A Little Help needed standing up from a chair using your arms (e.g., wheelchair or bedside chair)?: A Little Help needed to walk in hospital room?: A Little Help needed climbing 3-5 steps with a railing? : A Lot 6 Click Score: 19    End of Session Equipment Utilized During Treatment: Gait belt Activity Tolerance: Patient limited by fatigue Patient left: in bed;with call bell/phone within reach;with bed alarm set;with family/visitor present Nurse Communication: Mobility status PT Visit Diagnosis: Pain;Difficulty in walking, not elsewhere  classified (R26.2) Pain - Right/Left: Right Pain - part of body: Ankle and joints of foot (bilateral, abdomen as well)    Time: 1388-7195 PT Time Calculation (min) (ACUTE ONLY): 27 min   Charges:   PT Evaluation $PT Eval Low Complexity: 1 Low PT Treatments $Therapeutic Activity: 8-22 mins        Coolidge Breeze, PT, DPT [facility]  Department Office: [phone]  Coolidge Breeze 10/20/2021, 12:51 PM

## 2021-10-20 NOTE — Progress Notes (Signed)
Mobility Specialist - Progress Note   10/20/21 1404  Mobility  Activity Refused mobility   Mobility Specialist Cancellation/Refusal Note:   Reason for Cancellation/Refusal: Pt declined mobility at this time. Pt feeling fatigued. Will check back as schedule permits.    [facility]

## 2021-10-20 NOTE — Consult Note (Addendum)
Consultation  Referring Provider:  Dr. Lonny Prude Primary Care Physician:  Caren Macadam, MD (Inactive) Primary Gastroenterologist: Dr. Silverio Decamp    Reason for Consultation:  GI Bleed            HPI:   [name] is a 87 y.o. female with a past medical history of significant of chronic ulcerative rectosigmoid colitis, multifactorial anemia, hypertension, COPD and hypothyroidism, who presented to the ER for rectal bleeding.    At time of presentation patient described that she had blood-tinged bowel movements daily, but yesterday it was significantly more blood than usual.    Today, patient explains that she always sees a little bit of bright red blood mixed in with her stool, this has been for years ever since they diagnosed her proctosigmoid colitis and even before.  She continues on Mesalamine 2.4 g a day but does not ever recall being on suppositories other than during her hospitalization initially.  Tells me that no one has ever told her anything else to do as far as diet or other medications or other possibilities for her to control her symptoms better.  In general other than the bleeding she seems to have 1 solid bowel movement a day around the same time and no real abdominal pain or complaints.  She came into the hospital because the bleeding increased.  Describes being in the hospital on previous occasions with bleeding which stopped on its own.  Tells me that for some reason doctors told her to avoid blood transfusions if not absolutely necessary because they were worried about "a complication", she is not really sure about details of this and tells me that if she needed to have a blood transfusion she would get one.  As far as procedures patient had a complicated colonoscopy at some point then requiring surgical removal of part of her bowel "years ago" due to "obstruction", details are not clear.  Tells me though that if she needed to have a procedure such as a flexible endoscopy or  colonoscopy to decide next best steps in her care she would have 1 done.    Denies fever, chills, weight loss, abdominal pain, nausea or vomiting.  ED course: Hemoglobin notable from 3 months ago at 10.6--> 8.2 yesterday, FOBT negative, CTA with no active extravasation with diverticulosis without diverticulitis, moderate stenosis of the celiac artery  GI history: 03/11/2020 consulted by our service for anemia and heme positive stool: At that time declined procedures and blood products 11/18/2019 flex sig: --Preparation of the colon was unsatisfactory above 35 cm and fair below 35 cm. - Moderate diverticulosis in the sigmoid colon. There was narrowing of the colon in association with the diverticular opening. There was evidence of diverticular spasm. There was no evidence of diverticular bleeding. - Diffuse moderate inflammation was found in the rectum and in the sigmoid colon to 20 cm secondary to proctosigmoid colitis concerning for ulcerative proctosigmoiditis. Biopsied. - The examination was otherwise normal. Pathology-chronic active colitis-started on 2.4 g of oral mesalamine and Canasa suppositories  Past Medical History:  Diagnosis Date   ABDOMINAL PAIN, CHRONIC 05/28/2008   ALLERGIC RHINITIS 09/21/2006   ANEMIA-NOS 09/21/2006   ASTHMA 09/21/2006   ASYMPTOMATIC POSTMENOPAUSAL STATUS 05/28/2008   Cataract    CHEST PAIN, ATYPICAL 05/28/2008   COPD (chronic obstructive pulmonary disease) ([place])    COVID-19 virus infection 02/2020   DDD (degenerative disc disease)    DIVERTICULITIS, HX OF 09/21/2006   GERD 09/21/2006   HYPERCHOLESTEROLEMIA 06/05/2009  HYPERTENSION 09/21/2006   HYPOTHYROIDISM 09/21/2006   Peptic ulcer disease    Purpura ([place])    RENAL INSUFFICIENCY 09/21/2006   Shingles    Small bowel obstruction ([place])    Somatization disorder 04/25/2007   UNSPECIFIED URINARY CALCULUS 05/28/2008   UTI (urinary tract infection) 07/2019    Past Surgical History:  Procedure Laterality Date    ABDOMINAL HYSTERECTOMY  1981   acute Nephritis  [place]   BIOPSY  11/18/2019   Procedure: BIOPSY;  Surgeon: Ladene Artist, MD;  Location: [facility];  Service: Endoscopy;;   [place] N/A 11/18/2019   Procedure: FLEXIBLE SIGMOIDOSCOPY;  Surgeon: Ladene Artist, MD;  Location: [facility];  Service: Endoscopy;  Laterality: N/A;   NASAL SINUS SURGERY  1967   Pulmonary thrombosis     TONSILLECTOMY     TUBAL LIGATION      Family History  Problem Relation Age of Onset   Polycystic kidney disease Mother    Pancreatic cancer Mother    Other Father        Schamberg disease   Bladder Cancer Father    Hypertension Father    Marfan syndrome Son    Hemochromatosis Son    Cirrhosis Son    Allergic rhinitis Sister    Other Brother        bone issue as child; multiple fractures but seemed to age out of this   Hemochromatosis Cousin    Arthritis Sister    Colon cancer Neg Hx    Esophageal cancer Neg Hx    Stomach cancer Neg Hx     Social History   Tobacco Use   Smoking status: Never   Smokeless tobacco: Never  Vaping Use   Vaping Use: Never used  Substance Use Topics   Alcohol use: No   Drug use: No    Prior to Admission medications   Medication Sig Start Date End Date Taking? Authorizing Provider  acetaminophen (TYLENOL) 500 MG tablet Take 500 mg by mouth every 6 (six) hours as needed for moderate pain.   Yes [provider]  Ferrous Sulfate (IRON) 325 (65 FE) MG TABS Take 1 tablet by mouth daily.    Yes [provider]  fluticasone (FLOVENT HFA) 220 MCG/ACT inhaler Inhale 1 puff into the lungs in the morning and at bedtime. 10/01/21  Yes Farrel Conners, MD  levothyroxine (SYNTHROID) 75 MCG tablet TAKE 1 TABLET BY MOUTH EVERY DAY 04/05/21  Yes Koberlein, Junell C, MD  mesalamine (LIALDA) 1.2 g EC tablet Take 2 tablets (2.4 g total)  by mouth daily with breakfast. **PLEASE KEEP APPOINTMENT FOR REFILLS 10/08/21  Yes Willia Craze, NP  HYDROcodone-acetaminophen (NORCO/VICODIN) 5-325 MG tablet Take 0.5 tablets by mouth 2 (two) times daily as needed for moderate pain. Patient not taking: Reported on 07/28/2021 05/18/21   [place], Betty G, MD  Spacer/Aero-Holding Chambers (E-Z SPACER) inhaler Use as instructed Patient not taking: Reported on 10/19/2021 11/08/17   Caren Macadam, MD    Current Facility-Administered Medications  Medication Dose Route Frequency Provider Last Rate Last Admin   0.9 %  sodium chloride infusion   Intravenous Continuous Tu, Ching T, DO 75 mL/hr at 10/20/21 0400 Infusion Verify at 10/20/21 0400   acetaminophen (TYLENOL) tablet 500 mg  500 mg Oral Q6H PRN Ileene Musa T, DO  500 mg at 10/20/21 0101   budesonide (PULMICORT) nebulizer solution 0.5 mg  0.5 mg Nebulization BID Tu, Ching T, DO   0.5 mg at 10/20/21 8295   ferrous sulfate tablet 325 mg  325 mg Oral Q breakfast Tu, Ching T, DO       levothyroxine (SYNTHROID) tablet 75 mcg  75 mcg Oral Daily Tu, Ching T, DO   75 mcg at 10/20/21 6213   mesalamine (LIALDA) EC tablet 2.4 g  2.4 g Oral Q breakfast Tu, Ching T, DO        Allergies as of 10/19/2021 - Review Complete 10/19/2021  Allergen Reaction Noted   [place] nut (berthollefia excelsa) skin test Anaphylaxis 06/04/2019   Fluconazole Shortness Of Breath 02/23/2017   Metronidazole Shortness Of Breath and Nausea And Vomiting 10/07/2013   Pepcid [famotidine] Shortness Of Breath and Other (See Comments) 10/07/2013   Shellfish allergy Anaphylaxis 06/04/2019   Macrobid [nitrofurantoin]     Pantoprazole Other (See Comments) 10/30/2013     Review of Systems:    Constitutional: No weight loss, fever or chills Skin: No rash or itching Cardiovascular: No chest pain Respiratory: No SOB  Gastrointestinal: See HPI and otherwise negative Genitourinary: No dysuria  Neurological: No headache, dizziness or  syncope Musculoskeletal: No new muscle or joint pain Hematologic: No bruising Psychiatric: No history of depression or anxiety    Physical Exam:  Vital signs in last 24 hours: Temp:  [97.7 F (36.5 C)-98.4 F (36.9 C)] 97.7 F (36.5 C) (09/13 0658) Pulse Rate:  [73-92] 74 (09/13 0658) Resp:  [14-35] 35 (09/13 0749) BP: (123-168)/(55-102) 143/55 (09/13 0658) SpO2:  [96 %-100 %] 99 % (09/13 0749) Weight:  [70.3 kg] 70.3 kg (09/13 0100) Last BM Date :  (PTA) General:   Pleasant Elderly Caucasian female appears to be in NAD, Well developed, Well nourished, alert and cooperative Head:  Normocephalic and atraumatic. Eyes:   PEERL, EOMI. No icterus. Conjunctiva pink. Ears:  Normal auditory acuity. Neck:  Supple Throat: Oral cavity and pharynx without inflammation, swelling or lesion.  Lungs: Respirations even and unlabored. Lungs clear to auscultation bilaterally.   No wheezes, crackles, or rhonchi.  Heart: Normal S1, S2. No MRG. Regular rate and rhythm. No peripheral edema, cyanosis or pallor.  Abdomen:  Soft, nondistended, nontender. No rebound or guarding. Normal bowel sounds. No appreciable masses or hepatomegaly. Rectal:  Not performed.  Msk:  Symmetrical without gross deformities. Peripheral pulses intact.  Extremities:  Without edema, no deformity or joint abnormality.  Neurologic:  Alert and  oriented x4;  grossly normal neurologically.  Skin:   Dry and intact without significant lesions or rashes. Psychiatric: Demonstrates good judgement and reason without abnormal affect or behaviors.   LAB RESULTS: Recent Labs    10/19/21 1203 10/20/21 0418  WBC 8.1 6.1  HGB 8.2* 7.5*  HCT 26.2* 25.4*  PLT 534* 412*   BMET Recent Labs    10/19/21 1203  NA 135  K 4.6  CL 103  CO2 24  GLUCOSE 100*  BUN 21  CREATININE 0.78  CALCIUM 10.0   LFT Recent Labs    10/19/21 1203  PROT 7.1  ALBUMIN 4.1  AST 19  ALT 10  ALKPHOS 48  BILITOT 0.3    STUDIES: CT ANGIO GI  BLEED  Result Date: 10/19/2021 CLINICAL DATA:  Rectal bleeding. EXAM: CTA ABDOMEN AND PELVIS WITHOUT AND WITH CONTRAST TECHNIQUE: Multidetector CT imaging of the abdomen and pelvis was performed using the standard protocol during bolus administration  of intravenous contrast. Multiplanar reconstructed images and MIPs were obtained and reviewed to evaluate the vascular anatomy. RADIATION DOSE REDUCTION: This exam was performed according to the departmental dose-optimization program which includes automated exposure control, adjustment of the mA and/or kV according to patient size and/or use of iterative reconstruction technique. CONTRAST:  34m OMNIPAQUE IOHEXOL 350 MG/ML SOLN COMPARISON:  CT abdomen/pelvis 04/28/2020 FINDINGS: VASCULAR Aorta: There is scattered mild calcified atherosclerotic plaque in the nonaneurysmal abdominal aorta. There is no evidence of vasculitis or dissection. Celiac: There is moderate stenosis of the origin of the celiac artery (11-98). The remainder of the celiac artery and branches are patent without evidence of aneurysm, dissection, vasculitis, or other significant stenosis. SMA: Patent without evidence of aneurysm, dissection, vasculitis or significant stenosis. Renals: Both renal arteries are patent without evidence of aneurysm, dissection, vasculitis, fibromuscular dysplasia or significant stenosis. IMA: Patent without evidence of aneurysm, dissection, vasculitis or significant stenosis. Inflow: Patent without evidence of aneurysm, dissection, vasculitis or significant stenosis. Proximal Outflow: Bilateral common femoral and visualized portions of the superficial and profunda femoral arteries are patent without evidence of aneurysm, dissection, vasculitis or significant stenosis. Veins: The main portal and splenic veins are patent. There is no evidence of active extravasation Review of the MIP images confirms the above findings. NON-VASCULAR Lower chest: There is mild subsegmental  atelectasis in the left lower lobe adjacent to the hiatal hernia. Mitral annular calcifications are noted. The imaged heart is otherwise unremarkable. Hepatobiliary: The liver is unremarkable. The gallbladder is surgically absent. Pancreas: Unremarkable. Spleen: Unremarkable. Adrenals/Urinary Tract: The adrenals are unremarkable. The kidneys are unremarkable, with no focal lesion, stone, hydronephrosis, or hydroureter. The bladder is unremarkable. Stomach/Bowel: There is a moderate-sized hiatal hernia, similar to 2022. There is no evidence of bowel obstruction. Postsurgical changes are noted in the lower midabdomen. There is extensive sigmoid diverticulosis without evidence of acute diverticulitis. Lymphatic: There is no abdominal or pelvic lymphadenopathy. Reproductive: The uterus is surgically absent. There is no adnexal mass. Other: There is no ascites or free air. There is wide-mouth fat containing ventral abdominal hernia, unchanged. Musculoskeletal: There is no acute osseous abnormality or suspicious osseous lesion. There is multilevel degenerative change of the lumbar spine and asymmetrically advanced right hip degenerative change. IMPRESSION: 1. No evidence of active extravasation or other acute pathology in the abdomen or pelvis. 2. Moderate stenosis at the origin of the celiac artery. 3. Diverticulosis without evidence of acute diverticulitis. 4. Moderate-sized hiatal hernia. Electronically Signed   By: PValetta MoleM.D.   On: 10/19/2021 15:25      Impression / Plan:   Impression: 1.  Acute on chronic anemia: Baseline around 10 down to 8.2--> 7.5 overnight, Hemoccult negative, the patient describes seeing bright red blood in large quantity yesterday, none today, history of proctosigmoiditis on Mesalamine 2.4 g a day with recent increase in bleeding; likely related at least in part to her UC and chronic blood loss 2.  Proctosigmoiditis: Diagnosed via inpatient flex sig 11/2019 and maintained on  Mesalamine 2.4 g daily and Canasa suppositories  Plan: 1.  Discussed with patient that in order to decide on next best steps in her care it may be best if we could do a flex sigmoidoscopy or colonoscopy.  It does appear that she has had trouble prepping in the past.  Right now we are not exactly sure what the extent of her UC is, it could be that she needs to be on a different maintenance meds going forward or we  need to increase her current medication.  Patient told me she wants to think about this.  Told her she can discuss further with Dr. Henrene Pastor.later this afternoon 2.  Patient is not declining blood products today.  Tells me that if she were to absolutely need them she would have them. 3.  Continue Mesalamine 2.4 g daily for now 4.  We will add Canasa suppositories 1000 mg per rectum nightly x3 weeks 5.  Did discuss possibility of adding Prednisone, apparently she has had trouble with this in the past as well, but could likely be persuaded if we thought it was necessary. 6.  We will allow clear liquids for now and can adjust as needed if patient wants to proceed with endoscopic evaluation  Thank you for your kind consultation, we will continue to follow.  Lavone Nian [facility]  10/20/2021, 8:37 AM  Addendum:  Patient tentatively on for flex sigmoidoscopy tomorrow at 2:15 in the afternoon with Dr. Henrene Pastor.  She has been made n.p.o. at midnight and will have 2 tapwater enemas prior to time of procedure.  Ellouise Newer, PA-C  GI ATTENDING  History, laboratories, x-rays, prior endoscopy reports personally reviewed.  Patient personally seen and examined.  Son in room.  Agree with comprehensive consultation note as outlined above.  Patient has a history of chronic anemia for which she is followed by hematology.  She also has a history of proctosigmoiditis diagnosed on flexible sigmoidoscopy October 2021.  Patient presents herself to the hospital with complaints of increased amount of blood when  she looked into the toilet.  Hemoglobin is drifted from baseline of around 10-7.5.  Interestingly, her stools were heme-negative.  She has occasional mild abdominal pain.  She has been on oral mesalamine.  No topical therapy.  Of interest, the patient underwent colonoscopy with myself in 2007.  Uncomplicated from my perspective.  4 days later she presented to the hospital with small bowel obstruction requiring surgery.  All these years, she has been under the impression that small bowel obstruction necessitating surgery was a complication of her colonoscopy.  I corrected this.  In any event, plans are for flexible sigmoidoscopy tomorrow afternoon.The nature of the procedure, as well as the risks, benefits, and alternatives were carefully and thoroughly reviewed with the patient. Ample time for discussion and questions allowed. The patient understood, was satisfied, and agreed to proceed.  Docia Chuck. Geri Seminole., M.D. [facility] Division of Gastroenterology

## 2021-10-20 NOTE — H&P (View-Only) (Signed)
Consultation  Referring Provider:  Dr. Lonny Prude Primary Care Physician:  Caren Macadam, MD (Inactive) Primary Gastroenterologist: Dr. Silverio Decamp    Reason for Consultation:  GI Bleed            HPI:   [name] is a 87 y.o. female with a past medical history of significant of chronic ulcerative rectosigmoid colitis, multifactorial anemia, hypertension, COPD and hypothyroidism, who presented to the ER for rectal bleeding.    At time of presentation patient described that she had blood-tinged bowel movements daily, but yesterday it was significantly more blood than usual.    Today, patient explains that she always sees a little bit of bright red blood mixed in with her stool, this has been for years ever since they diagnosed her proctosigmoid colitis and even before.  She continues on Mesalamine 2.4 g a day but does not ever recall being on suppositories other than during her hospitalization initially.  Tells me that no one has ever told her anything else to do as far as diet or other medications or other possibilities for her to control her symptoms better.  In general other than the bleeding she seems to have 1 solid bowel movement a day around the same time and no real abdominal pain or complaints.  She came into the hospital because the bleeding increased.  Describes being in the hospital on previous occasions with bleeding which stopped on its own.  Tells me that for some reason doctors told her to avoid blood transfusions if not absolutely necessary because they were worried about "a complication", she is not really sure about details of this and tells me that if she needed to have a blood transfusion she would get one.  As far as procedures patient had a complicated colonoscopy at some point then requiring surgical removal of part of her bowel "years ago" due to "obstruction", details are not clear.  Tells me though that if she needed to have a procedure such as a flexible endoscopy or  colonoscopy to decide next best steps in her care she would have 1 done.    Denies fever, chills, weight loss, abdominal pain, nausea or vomiting.  ED course: Hemoglobin notable from 3 months ago at 10.6--> 8.2 yesterday, FOBT negative, CTA with no active extravasation with diverticulosis without diverticulitis, moderate stenosis of the celiac artery  GI history: 03/11/2020 consulted by our service for anemia and heme positive stool: At that time declined procedures and blood products 11/18/2019 flex sig: --Preparation of the colon was unsatisfactory above 35 cm and fair below 35 cm. - Moderate diverticulosis in the sigmoid colon. There was narrowing of the colon in association with the diverticular opening. There was evidence of diverticular spasm. There was no evidence of diverticular bleeding. - Diffuse moderate inflammation was found in the rectum and in the sigmoid colon to 20 cm secondary to proctosigmoid colitis concerning for ulcerative proctosigmoiditis. Biopsied. - The examination was otherwise normal. Pathology-chronic active colitis-started on 2.4 g of oral mesalamine and Canasa suppositories  Past Medical History:  Diagnosis Date   ABDOMINAL PAIN, CHRONIC 05/28/2008   ALLERGIC RHINITIS 09/21/2006   ANEMIA-NOS 09/21/2006   ASTHMA 09/21/2006   ASYMPTOMATIC POSTMENOPAUSAL STATUS 05/28/2008   Cataract    CHEST PAIN, ATYPICAL 05/28/2008   COPD (chronic obstructive pulmonary disease) ([place])    COVID-19 virus infection 02/2020   DDD (degenerative disc disease)    DIVERTICULITIS, HX OF 09/21/2006   GERD 09/21/2006   HYPERCHOLESTEROLEMIA 06/05/2009  HYPERTENSION 09/21/2006   HYPOTHYROIDISM 09/21/2006   Peptic ulcer disease    Purpura ([place])    RENAL INSUFFICIENCY 09/21/2006   Shingles    Small bowel obstruction ([place])    Somatization disorder 04/25/2007   UNSPECIFIED URINARY CALCULUS 05/28/2008   UTI (urinary tract infection) 07/2019    Past Surgical History:  Procedure Laterality Date    ABDOMINAL HYSTERECTOMY  1981   acute Nephritis  [name]   BIOPSY  11/18/2019   Procedure: BIOPSY;  Surgeon: Ladene Artist, MD;  Location: [facility];  Service: Endoscopy;;   [place] N/A 11/18/2019   Procedure: FLEXIBLE SIGMOIDOSCOPY;  Surgeon: Ladene Artist, MD;  Location: [facility];  Service: Endoscopy;  Laterality: N/A;   NASAL SINUS SURGERY  1967   Pulmonary thrombosis     TONSILLECTOMY     TUBAL LIGATION      Family History  Problem Relation Age of Onset   Polycystic kidney disease Mother    Pancreatic cancer Mother    Other Father        Schamberg disease   Bladder Cancer Father    Hypertension Father    Marfan syndrome Son    Hemochromatosis Son    Cirrhosis Son    Allergic rhinitis Sister    Other Brother        bone issue as child; multiple fractures but seemed to age out of this   Hemochromatosis Cousin    Arthritis Sister    Colon cancer Neg Hx    Esophageal cancer Neg Hx    Stomach cancer Neg Hx     Social History   Tobacco Use   Smoking status: Never   Smokeless tobacco: Never  Vaping Use   Vaping Use: Never used  Substance Use Topics   Alcohol use: No   Drug use: No    Prior to Admission medications   Medication Sig Start Date End Date Taking? Authorizing Provider  acetaminophen (TYLENOL) 500 MG tablet Take 500 mg by mouth every 6 (six) hours as needed for moderate pain.   Yes [provider]  Ferrous Sulfate (IRON) 325 (65 FE) MG TABS Take 1 tablet by mouth daily.    Yes [provider]  fluticasone (FLOVENT HFA) 220 MCG/ACT inhaler Inhale 1 puff into the lungs in the morning and at bedtime. 10/01/21  Yes Farrel Conners, MD  levothyroxine (SYNTHROID) 75 MCG tablet TAKE 1 TABLET BY MOUTH EVERY DAY 04/05/21  Yes Koberlein, Junell C, MD  mesalamine (LIALDA) 1.2 g EC tablet Take 2 tablets (2.4 g total)  by mouth daily with breakfast. **PLEASE KEEP APPOINTMENT FOR REFILLS 10/08/21  Yes Willia Craze, NP  HYDROcodone-acetaminophen (NORCO/VICODIN) 5-325 MG tablet Take 0.5 tablets by mouth 2 (two) times daily as needed for moderate pain. Patient not taking: Reported on 07/28/2021 05/18/21   Martinique, Betty G, MD  Spacer/Aero-Holding Chambers (E-Z SPACER) inhaler Use as instructed Patient not taking: Reported on 10/19/2021 11/08/17   Caren Macadam, MD    Current Facility-Administered Medications  Medication Dose Route Frequency Provider Last Rate Last Admin   0.9 %  sodium chloride infusion   Intravenous Continuous Tu, Ching T, DO 75 mL/hr at 10/20/21 0400 Infusion Verify at 10/20/21 0400   acetaminophen (TYLENOL) tablet 500 mg  500 mg Oral Q6H PRN Ileene Musa T, DO  500 mg at 10/20/21 0101   budesonide (PULMICORT) nebulizer solution 0.5 mg  0.5 mg Nebulization BID Tu, Ching T, DO   0.5 mg at 10/20/21 4481   ferrous sulfate tablet 325 mg  325 mg Oral Q breakfast Tu, Ching T, DO       levothyroxine (SYNTHROID) tablet 75 mcg  75 mcg Oral Daily Tu, Ching T, DO   75 mcg at 10/20/21 8563   mesalamine (LIALDA) EC tablet 2.4 g  2.4 g Oral Q breakfast Tu, Ching T, DO        Allergies as of 10/19/2021 - Review Complete 10/19/2021  Allergen Reaction Noted   [place] nut (berthollefia excelsa) skin test Anaphylaxis 06/04/2019   Fluconazole Shortness Of Breath 02/23/2017   Metronidazole Shortness Of Breath and Nausea And Vomiting 10/07/2013   Pepcid [famotidine] Shortness Of Breath and Other (See Comments) 10/07/2013   Shellfish allergy Anaphylaxis 06/04/2019   Macrobid [nitrofurantoin]     Pantoprazole Other (See Comments) 10/30/2013     Review of Systems:    Constitutional: No weight loss, fever or chills Skin: No rash or itching Cardiovascular: No chest pain Respiratory: No SOB  Gastrointestinal: See HPI and otherwise negative Genitourinary: No dysuria  Neurological: No headache, dizziness or  syncope Musculoskeletal: No new muscle or joint pain Hematologic: No bruising Psychiatric: No history of depression or anxiety    Physical Exam:  Vital signs in last 24 hours: Temp:  [97.7 F (36.5 C)-98.4 F (36.9 C)] 97.7 F (36.5 C) (09/13 0658) Pulse Rate:  [73-92] 74 (09/13 0658) Resp:  [14-35] 35 (09/13 0749) BP: (123-168)/(55-102) 143/55 (09/13 0658) SpO2:  [96 %-100 %] 99 % (09/13 0749) Weight:  [70.3 kg] 70.3 kg (09/13 0100) Last BM Date :  (PTA) General:   Pleasant Elderly Caucasian female appears to be in NAD, Well developed, Well nourished, alert and cooperative Head:  Normocephalic and atraumatic. Eyes:   PEERL, EOMI. No icterus. Conjunctiva pink. Ears:  Normal auditory acuity. Neck:  Supple Throat: Oral cavity and pharynx without inflammation, swelling or lesion.  Lungs: Respirations even and unlabored. Lungs clear to auscultation bilaterally.   No wheezes, crackles, or rhonchi.  Heart: Normal S1, S2. No MRG. Regular rate and rhythm. No peripheral edema, cyanosis or pallor.  Abdomen:  Soft, nondistended, nontender. No rebound or guarding. Normal bowel sounds. No appreciable masses or hepatomegaly. Rectal:  Not performed.  Msk:  Symmetrical without gross deformities. Peripheral pulses intact.  Extremities:  Without edema, no deformity or joint abnormality.  Neurologic:  Alert and  oriented x4;  grossly normal neurologically.  Skin:   Dry and intact without significant lesions or rashes. Psychiatric: Demonstrates good judgement and reason without abnormal affect or behaviors.   LAB RESULTS: Recent Labs    10/19/21 1203 10/20/21 0418  WBC 8.1 6.1  HGB 8.2* 7.5*  HCT 26.2* 25.4*  PLT 534* 412*   BMET Recent Labs    10/19/21 1203  NA 135  K 4.6  CL 103  CO2 24  GLUCOSE 100*  BUN 21  CREATININE 0.78  CALCIUM 10.0   LFT Recent Labs    10/19/21 1203  PROT 7.1  ALBUMIN 4.1  AST 19  ALT 10  ALKPHOS 48  BILITOT 0.3    STUDIES: CT ANGIO GI  BLEED  Result Date: 10/19/2021 CLINICAL DATA:  Rectal bleeding. EXAM: CTA ABDOMEN AND PELVIS WITHOUT AND WITH CONTRAST TECHNIQUE: Multidetector CT imaging of the abdomen and pelvis was performed using the standard protocol during bolus administration  of intravenous contrast. Multiplanar reconstructed images and MIPs were obtained and reviewed to evaluate the vascular anatomy. RADIATION DOSE REDUCTION: This exam was performed according to the departmental dose-optimization program which includes automated exposure control, adjustment of the mA and/or kV according to patient size and/or use of iterative reconstruction technique. CONTRAST:  28m OMNIPAQUE IOHEXOL 350 MG/ML SOLN COMPARISON:  CT abdomen/pelvis 04/28/2020 FINDINGS: VASCULAR Aorta: There is scattered mild calcified atherosclerotic plaque in the nonaneurysmal abdominal aorta. There is no evidence of vasculitis or dissection. Celiac: There is moderate stenosis of the origin of the celiac artery (11-98). The remainder of the celiac artery and branches are patent without evidence of aneurysm, dissection, vasculitis, or other significant stenosis. SMA: Patent without evidence of aneurysm, dissection, vasculitis or significant stenosis. Renals: Both renal arteries are patent without evidence of aneurysm, dissection, vasculitis, fibromuscular dysplasia or significant stenosis. IMA: Patent without evidence of aneurysm, dissection, vasculitis or significant stenosis. Inflow: Patent without evidence of aneurysm, dissection, vasculitis or significant stenosis. Proximal Outflow: Bilateral common femoral and visualized portions of the superficial and profunda femoral arteries are patent without evidence of aneurysm, dissection, vasculitis or significant stenosis. Veins: The main portal and splenic veins are patent. There is no evidence of active extravasation Review of the MIP images confirms the above findings. NON-VASCULAR Lower chest: There is mild subsegmental  atelectasis in the left lower lobe adjacent to the hiatal hernia. Mitral annular calcifications are noted. The imaged heart is otherwise unremarkable. Hepatobiliary: The liver is unremarkable. The gallbladder is surgically absent. Pancreas: Unremarkable. Spleen: Unremarkable. Adrenals/Urinary Tract: The adrenals are unremarkable. The kidneys are unremarkable, with no focal lesion, stone, hydronephrosis, or hydroureter. The bladder is unremarkable. Stomach/Bowel: There is a moderate-sized hiatal hernia, similar to 2022. There is no evidence of bowel obstruction. Postsurgical changes are noted in the lower midabdomen. There is extensive sigmoid diverticulosis without evidence of acute diverticulitis. Lymphatic: There is no abdominal or pelvic lymphadenopathy. Reproductive: The uterus is surgically absent. There is no adnexal mass. Other: There is no ascites or free air. There is wide-mouth fat containing ventral abdominal hernia, unchanged. Musculoskeletal: There is no acute osseous abnormality or suspicious osseous lesion. There is multilevel degenerative change of the lumbar spine and asymmetrically advanced right hip degenerative change. IMPRESSION: 1. No evidence of active extravasation or other acute pathology in the abdomen or pelvis. 2. Moderate stenosis at the origin of the celiac artery. 3. Diverticulosis without evidence of acute diverticulitis. 4. Moderate-sized hiatal hernia. Electronically Signed   By: PValetta MoleM.D.   On: 10/19/2021 15:25      Impression / Plan:   Impression: 1.  Acute on chronic anemia: Baseline around 10 down to 8.2--> 7.5 overnight, Hemoccult negative, the patient describes seeing bright red blood in large quantity yesterday, none today, history of proctosigmoiditis on Mesalamine 2.4 g a day with recent increase in bleeding; likely related at least in part to her UC and chronic blood loss 2.  Proctosigmoiditis: Diagnosed via inpatient flex sig 11/2019 and maintained on  Mesalamine 2.4 g daily and Canasa suppositories  Plan: 1.  Discussed with patient that in order to decide on next best steps in her care it may be best if we could do a flex sigmoidoscopy or colonoscopy.  It does appear that she has had trouble prepping in the past.  Right now we are not exactly sure what the extent of her UC is, it could be that she needs to be on a different maintenance meds going forward or we  need to increase her current medication.  Patient told me she wants to think about this.  Told her she can discuss further with Dr. Henrene Pastor.later this afternoon 2.  Patient is not declining blood products today.  Tells me that if she were to absolutely need them she would have them. 3.  Continue Mesalamine 2.4 g daily for now 4.  We will add Canasa suppositories 1000 mg per rectum nightly x3 weeks 5.  Did discuss possibility of adding Prednisone, apparently she has had trouble with this in the past as well, but could likely be persuaded if we thought it was necessary. 6.  We will allow clear liquids for now and can adjust as needed if patient wants to proceed with endoscopic evaluation  Thank you for your kind consultation, we will continue to follow.  Lavone Nian [facility]  10/20/2021, 8:37 AM  Addendum:  Patient tentatively on for flex sigmoidoscopy tomorrow at 2:15 in the afternoon with Dr. Henrene Pastor.  She has been made n.p.o. at midnight and will have 2 tapwater enemas prior to time of procedure.  Ellouise Newer, PA-C  GI ATTENDING  History, laboratories, x-rays, prior endoscopy reports personally reviewed.  Patient personally seen and examined.  Son in room.  Agree with comprehensive consultation note as outlined above.  Patient has a history of chronic anemia for which she is followed by hematology.  She also has a history of proctosigmoiditis diagnosed on flexible sigmoidoscopy October 2021.  Patient presents herself to the hospital with complaints of increased amount of blood when  she looked into the toilet.  Hemoglobin is drifted from baseline of around 10-7.5.  Interestingly, her stools were heme-negative.  She has occasional mild abdominal pain.  She has been on oral mesalamine.  No topical therapy.  Of interest, the patient underwent colonoscopy with myself in 2007.  Uncomplicated from my perspective.  4 days later she presented to the hospital with small bowel obstruction requiring surgery.  All these years, she has been under the impression that small bowel obstruction necessitating surgery was a complication of her colonoscopy.  I corrected this.  In any event, plans are for flexible sigmoidoscopy tomorrow afternoon.The nature of the procedure, as well as the risks, benefits, and alternatives were carefully and thoroughly reviewed with the patient. Ample time for discussion and questions allowed. The patient understood, was satisfied, and agreed to proceed.  Docia Chuck. Geri Seminole., M.D. [facility] Division of Gastroenterology

## 2021-10-20 NOTE — Progress Notes (Signed)
PROGRESS NOTE    [name]  WUJ:[id number] DOB: [birth year] DOA: 10/19/2021 PCP: Caren Macadam, MD (Inactive)   Brief Narrative: [name] is an 87 y.o. female with a history of ulcerative colitis, chronic anemia, hypertension, COPD and hypothyroidism. She presented secondary to rectal bleeding in setting of known ulcerative colitis. [facility] consulted.   Assessment and Plan: Acute on chronic anemia Normocytic. Presumably from blood loss but patient is without iron deficiency. Baseline hemoglobin of around 10-11. Hemoglobin of 8.2 on admission and is down to 7.5 today. -CBC in AM  Chronic ulcerative rectosigmoiditis with rectal bleeding (HCC) Acute rectal bleeding with associated acute on chronic anemia. [facility] consulted. No rectal bleeding overnight.  -Continue mesalamine  -GI recommendations: consideration for flexible sigmoidoscopy, continue mesalamine, clear liquid diet  COPD (chronic obstructive pulmonary disease) (HCC) Stable. -Continue Pulmicort  Essential hypertension Uncontrolled SBP. Patient is not on antihypertensive therapy as an outpatient. -Hydralazine PRN  Hypothyroidism -Continue home levothyroxine    DVT prophylaxis: SCDs Code Status:   Code Status: DNR Family Communication: None at bedside Disposition Plan: Discharge back to ILF pending GI recommendations/management and stable hemoglobin.   Consultants:  [facility]  Procedures:  None  Antimicrobials: None    Subjective: Patient reports no hematochezia this morning. No nausea or vomiting.  Objective: BP (!) 143/55 (BP Location: Left Arm)   Pulse 74   Temp 97.7 F (36.5 C) (Oral)   Resp (!) 35   Ht 5' 8"  (1.727 m)   Wt 70.3 kg   SpO2 99%   BMI 23.57 kg/m   Examination:  General exam: Appears calm and comfortable Respiratory system: Clear to auscultation. Respiratory effort normal. Cardiovascular system: S1 & S2 heard, RRR. Gastrointestinal system:  Abdomen is nondistended, soft and nontender. Normal bowel sounds heard. Central nervous system: Alert and oriented. No focal neurological deficits. Musculoskeletal: No edema. No calf tenderness Skin: No cyanosis. No rashes Psychiatry: Judgement and insight appear normal. Mood & affect appropriate.    Data Reviewed: I have personally reviewed following labs and imaging studies  CBC Lab Results  Component Value Date   WBC 6.1 10/20/2021   RBC 2.46 (L) 10/20/2021   HGB 7.5 (L) 10/20/2021   HCT 25.4 (L) 10/20/2021   MCV 103.3 (H) 10/20/2021   MCH 30.5 10/20/2021   PLT 412 (H) 10/20/2021   MCHC 29.5 (L) 10/20/2021   RDW 13.8 10/20/2021   LYMPHSABS 1.2 07/20/2021   MONOABS 0.8 07/20/2021   EOSABS 0.0 07/20/2021   BASOSABS 0.1 95/62/1308     Last metabolic panel Lab Results  Component Value Date   NA 135 10/19/2021   K 4.6 10/19/2021   CL 103 10/19/2021   CO2 24 10/19/2021   BUN 21 10/19/2021   CREATININE 0.78 10/19/2021   GLUCOSE 100 (H) 10/19/2021   GFRNONAA >60 10/19/2021   GFRAA >60 08/09/2019   CALCIUM 10.0 10/19/2021   PHOS 3.7 08/14/2020   PROT 7.1 10/19/2021   ALBUMIN 4.1 10/19/2021   BILITOT 0.3 10/19/2021   ALKPHOS 48 10/19/2021   AST 19 10/19/2021   ALT 10 10/19/2021   ANIONGAP 8 10/19/2021    GFR: Estimated Creatinine Clearance: 51.9 mL/min (by C-G formula based on SCr of 0.78 mg/dL).  No results found for this or any previous visit (from the past 240 hour(s)).    Radiology Studies: CT ANGIO GI BLEED  Result Date: 10/19/2021 CLINICAL DATA:  Rectal bleeding. EXAM: CTA ABDOMEN AND PELVIS WITHOUT AND WITH CONTRAST TECHNIQUE:  Multidetector CT imaging of the abdomen and pelvis was performed using the standard protocol during bolus administration of intravenous contrast. Multiplanar reconstructed images and MIPs were obtained and reviewed to evaluate the vascular anatomy. RADIATION DOSE REDUCTION: This exam was performed according to the departmental  dose-optimization program which includes automated exposure control, adjustment of the mA and/or kV according to patient size and/or use of iterative reconstruction technique. CONTRAST:  56m OMNIPAQUE IOHEXOL 350 MG/ML SOLN COMPARISON:  CT abdomen/pelvis 04/28/2020 FINDINGS: VASCULAR Aorta: There is scattered mild calcified atherosclerotic plaque in the nonaneurysmal abdominal aorta. There is no evidence of vasculitis or dissection. Celiac: There is moderate stenosis of the origin of the celiac artery (11-98). The remainder of the celiac artery and branches are patent without evidence of aneurysm, dissection, vasculitis, or other significant stenosis. SMA: Patent without evidence of aneurysm, dissection, vasculitis or significant stenosis. Renals: Both renal arteries are patent without evidence of aneurysm, dissection, vasculitis, fibromuscular dysplasia or significant stenosis. IMA: Patent without evidence of aneurysm, dissection, vasculitis or significant stenosis. Inflow: Patent without evidence of aneurysm, dissection, vasculitis or significant stenosis. Proximal Outflow: Bilateral common femoral and visualized portions of the superficial and profunda femoral arteries are patent without evidence of aneurysm, dissection, vasculitis or significant stenosis. Veins: The main portal and splenic veins are patent. There is no evidence of active extravasation Review of the MIP images confirms the above findings. NON-VASCULAR Lower chest: There is mild subsegmental atelectasis in the left lower lobe adjacent to the hiatal hernia. Mitral annular calcifications are noted. The imaged heart is otherwise unremarkable. Hepatobiliary: The liver is unremarkable. The gallbladder is surgically absent. Pancreas: Unremarkable. Spleen: Unremarkable. Adrenals/Urinary Tract: The adrenals are unremarkable. The kidneys are unremarkable, with no focal lesion, stone, hydronephrosis, or hydroureter. The bladder is unremarkable.  Stomach/Bowel: There is a moderate-sized hiatal hernia, similar to 2022. There is no evidence of bowel obstruction. Postsurgical changes are noted in the lower midabdomen. There is extensive sigmoid diverticulosis without evidence of acute diverticulitis. Lymphatic: There is no abdominal or pelvic lymphadenopathy. Reproductive: The uterus is surgically absent. There is no adnexal mass. Other: There is no ascites or free air. There is wide-mouth fat containing ventral abdominal hernia, unchanged. Musculoskeletal: There is no acute osseous abnormality or suspicious osseous lesion. There is multilevel degenerative change of the lumbar spine and asymmetrically advanced right hip degenerative change. IMPRESSION: 1. No evidence of active extravasation or other acute pathology in the abdomen or pelvis. 2. Moderate stenosis at the origin of the celiac artery. 3. Diverticulosis without evidence of acute diverticulitis. 4. Moderate-sized hiatal hernia. Electronically Signed   By: PValetta MoleM.D.   On: 10/19/2021 15:25      LOS: 0 days    RCordelia Poche MD [facility] 10/20/2021, 7:51 AM   If 7PM-7AM, please contact night-coverage [email or web address]

## 2021-10-20 NOTE — Assessment & Plan Note (Addendum)
Normocytic. Presumably from blood loss but patient is without iron deficiency. Baseline hemoglobin of around 10-11. Hemoglobin of 8.2 on admission and down to 7.5 and stabilized at 7.6 today. -CBC in AM

## 2021-10-21 ENCOUNTER — Observation Stay (HOSPITAL_BASED_OUTPATIENT_CLINIC_OR_DEPARTMENT_OTHER): Payer: Medicare Other | Admitting: Certified Registered Nurse Anesthetist

## 2021-10-21 ENCOUNTER — Encounter (HOSPITAL_COMMUNITY): Payer: Self-pay | Admitting: Family Medicine

## 2021-10-21 ENCOUNTER — Observation Stay (HOSPITAL_COMMUNITY): Payer: Medicare Other | Admitting: Certified Registered Nurse Anesthetist

## 2021-10-21 ENCOUNTER — Encounter (HOSPITAL_COMMUNITY)
Admission: EM | Disposition: A | Discharge status: Home Health | Payer: Self-pay | Source: Home / Self Care | Attending: Emergency Medicine

## 2021-10-21 DIAGNOSIS — K641 Second degree hemorrhoids: Secondary | ICD-10-CM

## 2021-10-21 DIAGNOSIS — J449 Chronic obstructive pulmonary disease, unspecified: Secondary | ICD-10-CM

## 2021-10-21 DIAGNOSIS — K51311 Ulcerative (chronic) rectosigmoiditis with rectal bleeding: Secondary | ICD-10-CM | POA: Diagnosis not present

## 2021-10-21 DIAGNOSIS — E039 Hypothyroidism, unspecified: Secondary | ICD-10-CM | POA: Diagnosis not present

## 2021-10-21 DIAGNOSIS — K529 Noninfective gastroenteritis and colitis, unspecified: Secondary | ICD-10-CM

## 2021-10-21 DIAGNOSIS — I10 Essential (primary) hypertension: Secondary | ICD-10-CM | POA: Diagnosis not present

## 2021-10-21 DIAGNOSIS — D649 Anemia, unspecified: Secondary | ICD-10-CM | POA: Diagnosis not present

## 2021-10-21 DIAGNOSIS — K921 Melena: Secondary | ICD-10-CM | POA: Diagnosis not present

## 2021-10-21 HISTORY — PX: FLEXIBLE SIGMOIDOSCOPY: SHX5431

## 2021-10-21 HISTORY — PX: BIOPSY: SHX5522

## 2021-10-21 LAB — CBC
HCT: 24.7 % — ABNORMAL LOW (ref 36.0–46.0)
Hemoglobin: 7.6 g/dL — ABNORMAL LOW (ref 12.0–15.0)
MCH: 30.6 pg (ref 26.0–34.0)
MCHC: 30.8 g/dL (ref 30.0–36.0)
MCV: 99.6 fL (ref 80.0–100.0)
Platelets: 415 10*3/uL — ABNORMAL HIGH (ref 150–400)
RBC: 2.48 MIL/uL — ABNORMAL LOW (ref 3.87–5.11)
RDW: 13.7 % (ref 11.5–15.5)
WBC: 5 10*3/uL (ref 4.0–10.5)
nRBC: 0 % (ref 0.0–0.2)

## 2021-10-21 SURGERY — SIGMOIDOSCOPY, FLEXIBLE
Anesthesia: Monitor Anesthesia Care

## 2021-10-21 MED ORDER — LACTATED RINGERS IV SOLN: INTRAVENOUS | Status: DC | PRN

## 2021-10-21 MED ORDER — LIDOCAINE HCL (CARDIAC) PF 100 MG/5ML IV SOSY
PREFILLED_SYRINGE | INTRAVENOUS | Status: DC | PRN
  Administered 2021-10-21: 50 mg via INTRAVENOUS

## 2021-10-21 MED ORDER — PROPOFOL 500 MG/50ML IV EMUL
INTRAVENOUS | Status: DC | PRN
  Administered 2021-10-21: 100 ug/kg/min via INTRAVENOUS

## 2021-10-21 MED ORDER — SODIUM CHLORIDE 0.9 % IV SOLN: INTRAVENOUS | Status: DC

## 2021-10-21 MED ORDER — PROPOFOL 10 MG/ML IV BOLUS
INTRAVENOUS | Status: DC | PRN
  Administered 2021-10-21: 20 mg via INTRAVENOUS

## 2021-10-21 MED ORDER — PHENYLEPHRINE HCL (PRESSORS) 10 MG/ML IV SOLN
INTRAVENOUS | Status: DC | PRN
  Administered 2021-10-21: 120 ug via INTRAVENOUS

## 2021-10-21 MED ORDER — MESALAMINE 4 G RE ENEM
4.0000 g | ENEMA | Freq: Every day | RECTAL | Status: DC
  Filled 2021-10-21: qty 60

## 2021-10-21 NOTE — Anesthesia Postprocedure Evaluation (Signed)
Anesthesia Post Note  Patient: [name]  Procedure(s) Performed: FLEXIBLE SIGMOIDOSCOPY BIOPSY     Patient location during evaluation: PACU Anesthesia Type: MAC Level of consciousness: awake and alert and oriented Pain management: pain level controlled Vital Signs Assessment: post-procedure vital signs reviewed and stable Respiratory status: spontaneous breathing, nonlabored ventilation and respiratory function stable Cardiovascular status: blood pressure returned to baseline and stable Postop Assessment: no apparent nausea or vomiting Anesthetic complications: no   No notable events documented.  Last Vitals:  Vitals:   10/21/21 1347 10/21/21 1350  BP: (!) 141/58 (!) 156/50  Pulse: 77 79  Resp: 20 16  Temp:    SpO2: 100% 100%    Last Pain:  Vitals:   10/21/21 1347  TempSrc:   PainSc: 9                  Vestal Markin A.

## 2021-10-21 NOTE — Transfer of Care (Signed)
Immediate Anesthesia Transfer of Care Note  Patient: [name]  Procedure(s) Performed: FLEXIBLE SIGMOIDOSCOPY BIOPSY  Patient Location: PACU  Anesthesia Type:MAC  Level of Consciousness: awake, alert  and patient cooperative  Airway & Oxygen Therapy: Patient Spontanous Breathing  Post-op Assessment: Report given to RN, Post -op Vital signs reviewed and stable and Patient moving all extremities X 4  Post vital signs: Reviewed and stable  Last Vitals:  Vitals Value Taken Time  BP 142/31 10/21/21 1335  Temp    Pulse 75 10/21/21 1336  Resp 19 10/21/21 1336  SpO2 100 % 10/21/21 1336  Vitals shown include unvalidated device data.  Last Pain:  Vitals:   10/21/21 1237  TempSrc: Temporal  PainSc: 8       Patients Stated Pain Goal: 0 (01/30/81 5003)  Complications: No notable events documented.

## 2021-10-21 NOTE — Op Note (Addendum)
[facility] Patient Name: [name] Procedure Date: 10/21/2021 MRN: [id number] Attending MD: Justice Britain , MD Date of Birth: 22-Jan-1935 CSN: [id number] Age: 87 Admit Type: Inpatient Procedure:                Flexible Sigmoidoscopy Indications:              Hematochezia, Anemia Providers:                Jeanella Cara, RN, Gloris Ham,                            Technician, Justice Britain, MD Referring MD:             Docia Chuck. Henrene Pastor, MD, Mauri Pole, MD,                            Inpatient Medicine service Medicines:                Monitored Anesthesia Care Complications:            No immediate complications. Estimated Blood Loss:     Estimated blood loss was minimal. Procedure:                Pre-Anesthesia Assessment:                           - Prior to the procedure, a History and Physical                            was performed, and patient medications and                            allergies were reviewed. The patient's tolerance of                            previous anesthesia was also reviewed. The risks                            and benefits of the procedure and the sedation                            options and risks were discussed with the patient.                            All questions were answered, and informed consent                            was obtained. Prior Anticoagulants: The patient has                            taken no previous anticoagulant or antiplatelet                            agents. ASA Grade Assessment: III - A patient with  severe systemic disease. After reviewing the risks                            and benefits, the patient was deemed in                            satisfactory condition to undergo the procedure.                           After obtaining informed consent, the scope was                            passed under direct vision. The PCF-H190TL                             ([id number]) Olympus slim colonoscope was introduced                            through the anus and advanced to the the left                            transverse colon. The flexible sigmoidoscopy was                            accomplished without difficulty. The patient                            tolerated the procedure. The quality of the bowel                            preparation was inadequate. Scope In: Scope Out: Findings:      The digital rectal exam findings include hemorrhoids. Pertinent       negatives include no palpable rectal lesions.      A large amount of semi-solid solid stool was found in the entire colon,       interfering with visualization. Lavage of the area was performed using       copious amounts, resulting in incomplete clearance with only near-fair       visualization.      Inflammation characterized by congestion (edema), friability and       granularity was found in a continuous and circumferential pattern from       the anus to the sigmoid colon. No sites were spared. This was graded as       Mayo Score 2 (moderate, with marked erythema, absent vascular pattern,       friability, erosions). Biopsies were taken with a cold forceps for       histology.      Non-bleeding non-thrombosed internal hemorrhoids were found during       retroflexion, during perianal exam and during digital exam. The       hemorrhoids were Grade II (internal hemorrhoids that prolapse but reduce       spontaneously). Impression:               - Preparation of the colon was inadequate due to  solid stool.                           - Hemorrhoids found on digital rectal exam.                           - Stool in the entire examined colon.                           - Proctosigmoid colitis. Inflammation was found                            from the anus to the sigmoid colon. This was graded                            as Mayo Score 2 (moderate  disease). Biopsied.                           - Non-bleeding non-thrombosed internal hemorrhoids. Moderate Sedation:      Not Applicable - Patient had care per Anesthesia. Recommendation:           - The patient will be observed post-procedure,                            until all discharge criteria are met.                           - Return patient to hospital ward for ongoing care.                           - Resume previous diet.                           - Await pathology results.                           - May be reasonable to consider steroid therapy or                            topical steroid therapy with enemas/supossitories,                            but suspect oral therapy will be more effective in                            what seems to be active disease.                           - Unfortunately, as with the previous 2021                            sigmoidoscopy, large amount of solid stool burden                            precludes visualization to ensure no evidence  of                            more extension of her disease. Will defer to                            primary GI service to decide with patient if she                            will need full colonoscopy.                           - The findings and recommendations were discussed                            with the patient.                           - The findings and recommendations were discussed                            with the patient's family. Procedure Code(s):        --- Professional ---                           [phone], Sigmoidoscopy, flexible; with biopsy, single                            or multiple Diagnosis Code(s):        --- Professional ---                           K64.1, Second degree hemorrhoids                           K52.9, Noninfective gastroenteritis and colitis,                            unspecified                           K92.1, Melena (includes Hematochezia) CPT  copyright 2019 American Medical Association. All rights reserved. The codes documented in this report are preliminary and upon coder review may  be revised to meet current compliance requirements. Justice Britain, MD 10/21/2021 1:47:42 PM Number of Addenda: 0

## 2021-10-21 NOTE — Anesthesia Preprocedure Evaluation (Addendum)
Anesthesia Evaluation  Patient identified by MRN, date of birth, ID band Patient awake    Reviewed: Allergy & Precautions, NPO status , Patient's Chart, lab work & pertinent test results  Airway Mallampati: II  TM Distance: >3 FB Neck ROM: Full    Dental  (+) Partial Upper, Partial Lower, Dental Advisory Given   Pulmonary COPD,  COPD inhaler,    breath sounds clear to auscultation       Cardiovascular hypertension,  Rhythm:Regular Rate:Normal     Neuro/Psych PSYCHIATRIC DISORDERS negative neurological ROS     GI/Hepatic Neg liver ROS, PUD, GERD  Medicated,Rectal Bleeding Hx/o rectosigmoiditis   Endo/Other  Hypothyroidism Hyperchoesterolemia  Renal/GU Renal InsufficiencyRenal disease  negative genitourinary   Musculoskeletal  (+) Arthritis , Osteoarthritis,    Abdominal Normal abdominal exam  (+)   Peds  Hematology  (+) Blood dyscrasia, anemia ,   Anesthesia Other Findings   Reproductive/Obstetrics                            Anesthesia Physical  Anesthesia Plan  ASA: 3  Anesthesia Plan: MAC   Post-op Pain Management:    Induction: Intravenous  PONV Risk Score and Plan: 0 and Propofol infusion  Airway Management Planned: Natural Airway and Simple Face Mask  Additional Equipment: None  Intra-op Plan:   Post-operative Plan:   Informed Consent: I have reviewed the patients History and Physical, chart, labs and discussed the procedure including the risks, benefits and alternatives for the proposed anesthesia with the patient or authorized representative who has indicated his/her understanding and acceptance.   Patient has DNR.  Discussed DNR with patient and Suspend DNR.     Plan Discussed with: CRNA and Anesthesiologist  Anesthesia Plan Comments:         Anesthesia Quick Evaluation

## 2021-10-21 NOTE — Interval H&P Note (Signed)
History and Physical Interval Note:  10/21/2021 12:53 PM  [name]  has presented today for surgery, with the diagnosis of Rectal Bleeding, Acute on chronic Anemia.  The various methods of treatment have been discussed with the patient and family. After consideration of risks, benefits and other options for treatment, the patient has consented to  Procedure(s): FLEXIBLE SIGMOIDOSCOPY (N/A) as a surgical intervention.  The patient's history has been reviewed, patient examined, no change in status, stable for surgery.  I have reviewed the patient's chart and labs.  Questions were answered to the patient's satisfaction.     [employer]

## 2021-10-21 NOTE — Progress Notes (Signed)
Mobility Specialist Cancellation/Refusal Note:    10/21/21 1016  Mobility  Activity Refused mobility     Reason for Cancellation/Refusal: Pt declined mobility at this time. Pt feeling weak. Will check back as schedule permits.       [facility]

## 2021-10-21 NOTE — TOC Initial Note (Signed)
Transition of Care [facility]) - Initial/Assessment Note    Patient Details  Name: [name] MRN: [id number] Date of Birth: Feb 02, 1935  [facility] CM/SW Contact:    Roseanne Kaufman, RN Phone Number: 10/21/2021, 4:17 PM  Clinical Narrative:       Spoke with patient's son Ed who reports patient's current PCP has left the [facility] practice however patient has an appointment with Dr. Nash Mantis with [facility] on 11/01/21, and her GI specialist the following week unsure of date. Patient sees Dr. Alen Blew for oncology.  TOC will work to coordinate [facility] services.    [facility] [address] will [facility] to follow.   Expected Discharge Plan: [place] Barriers to Discharge: Continued Medical Work up   Patient Goals and CMS Choice Patient states their goals for this hospitalization and ongoing recovery are:: return home with home health services CMS Medicare.gov Compare Post Acute Care list provided to:: Patient Represenative (must comment) [name]- son) Choice offered to / list presented to : Adult Children  Expected Discharge Plan and Services Expected Discharge Plan: [place] In-house Referral: NA Discharge Planning Services: CM Consult Post Acute Care Choice: Home Health (PT, OT) Living arrangements for the past 2 months: Apartment                 DME Arranged: N/A DME Agency: NA       HH Arranged: PT, OT          Prior Living Arrangements/Services Living arrangements for the past 2 months: Apartment Lives with:: Self Patient language and need for interpreter reviewed:: Yes Do you feel safe going back to the place where you live?: Yes      Need for Family Participation in Patient Care: Yes (Comment) Care giver support system in place?: Yes (comment) Current home services: DME (cane, walseeatker, wheelchair, elevated toilet) Criminal Activity/Legal Involvement Pertinent to Current  Situation/Hospitalization: No - Comment as needed  Activities of Daily Living Home Assistive Devices/Equipment: Cane (specify quad or straight) (quad- prefers quad) ADL Screening (condition at time of admission) Patient's cognitive ability adequate to safely complete daily activities?: No Is the patient deaf or have difficulty hearing?: No Does the patient have difficulty seeing, even when wearing glasses/contacts?: No Does the patient have difficulty concentrating, remembering, or making decisions?: No Patient able to express need for assistance with ADLs?: No Does the patient have difficulty dressing or bathing?: No Independently performs ADLs?: Yes (appropriate for developmental age) Does the patient have difficulty walking or climbing stairs?: No Weakness of Legs: None Weakness of Arms/Hands: None  Permission Sought/Granted Permission sought to share information with : Case Manager Permission granted to share information with : Yes, Verbal Permission Granted  Share Information with NAME: Case Manager           Emotional Assessment Appearance:: Appears stated age Attitude/Demeanor/Rapport: Gracious Affect (typically observed): Accepting Orientation: : Oriented to Place, Oriented to Self, Oriented to  Time Alcohol / Substance Use: Not Applicable Psych Involvement: No (comment)  Admission diagnosis:  Rectal bleeding [K62.5] Symptomatic anemia [D64.9] Gastrointestinal hemorrhage, unspecified gastrointestinal hemorrhage type [K92.2] Patient Active Problem List   Diagnosis Date Noted   Acute on chronic anemia 10/20/2021   Rectal bleeding 10/19/2021   Symptomatic anemia 10/13/2020   GIB (gastrointestinal bleeding) 03/11/2020   Chronic ulcerative rectosigmoiditis with rectal bleeding (HCC)    Abnormal CT scan, gastrointestinal tract  LLQ abdominal pain 10/31/2019   Diverticulitis 10/31/2019   UTI (urinary tract infection) 08/06/2019   COPD (chronic obstructive pulmonary  disease) (HCC)    Ulcerative colitis ([place])    Vitamin D deficiency 05/01/2019   Arthralgia 08/21/2017   Osteoporosis 11/21/2016   Postmenopausal status 09/06/2016   Other nonspecific abnormal finding of lung field 08/14/2015   Hyponatremia 07/21/2014   Menopausal state 07/18/2014   Hematest positive stools 08/05/2013   Anemia, blood loss 08/05/2013   Cerumen impaction 06/03/2013   History of adrenal disorder 10/15/2012   Rash and nonspecific skin eruption 10/15/2012   Iron deficiency anemia 06/20/2012   Psychogenic polydipsia 06/20/2012   Nonspecific abnormal electrocardiogram (ECG) (EKG) 06/20/2012   Polyuria 05/02/2012   Pelvic pain in female 08/16/2011   Leg ulcer ([place]) 06/16/2010   Routine general medical examination at a health care facility 06/11/2010   Encounter for long-term (current) use of other medications 06/11/2010   HYPERCHOLESTEROLEMIA 06/05/2009   UNSPECIFIED URINARY CALCULUS 05/28/2008   CHEST PAIN, ATYPICAL 05/28/2008   ABDOMINAL PAIN, CHRONIC 05/28/2008   ASYMPTOMATIC POSTMENOPAUSAL STATUS 05/28/2008   SOMATIZATION DISORDER 04/25/2007   Hypothyroidism 09/21/2006   Essential hypertension 09/21/2006   ALLERGIC RHINITIS 09/21/2006   ASTHMA 09/21/2006   GERD 09/21/2006   Disorder resulting from impaired renal function 09/21/2006   DIVERTICULITIS, HX OF 09/21/2006   PCP:  Caren Macadam, MD (Inactive) [facility]   [id number]: [phone]: [phone]    Social Determinants of Health (SDOH) Interventions    Readmission Risk Interventions    10/16/2020   10:07 AM  Readmission Risk Prevention Plan  Transportation Screening Complete  PCP or Specialist Appt within 5-7 Days Complete  Home Care Screening Complete  Medication Review (RN CM) Complete

## 2021-10-21 NOTE — Progress Notes (Signed)
PROGRESS NOTE    [name]  EPP:[id number] DOB: 1935-01-03 DOA: 10/19/2021 PCP: Caren Macadam, MD (Inactive)   Brief Narrative: [name] is an 87 y.o. female with a history of ulcerative colitis, chronic anemia, hypertension, COPD and hypothyroidism. She presented secondary to rectal bleeding in setting of known ulcerative colitis. Hemoglobin of 8.2 on admission, down from baseline of 10-11. [facility] consulted and plan for flexible sigmoidoscopy on 9/14.   Assessment and Plan: Acute on chronic anemia Normocytic. Presumably from blood loss but patient is without iron deficiency. Baseline hemoglobin of around 10-11. Hemoglobin of 8.2 on admission and down to 7.5 and stabilized at 7.6 today. -CBC in AM  Chronic ulcerative rectosigmoiditis with rectal bleeding (HCC) Acute rectal bleeding with associated acute on chronic anemia. [facility] consulted. Continues to have no rectal bleeding.  -Continue mesalamine  -GI recommendations: flexible sigmoidoscopy, continue mesalamine  COPD (chronic obstructive pulmonary disease) (HCC) Stable. -Continue Pulmicort  Essential hypertension Uncontrolled SBP. Patient is not on antihypertensive therapy as an outpatient. -Hydralazine PRN  Hypothyroidism -Continue home levothyroxine    DVT prophylaxis: SCDs Code Status:   Code Status: DNR Family Communication: None at bedside Disposition Plan: Discharge back to ILF pending GI recommendations/management and stable hemoglobin. Anticipate discharge in likely 24 hours pending GI recommendations. PT recommending HHPT.   Consultants:  [facility]  Procedures:  None  Antimicrobials: None    Subjective: Patient reports not feeling well today. Some abdominal pain. No bowel movement, although she feels some pressure like she may need to have one soon. Afebrile overnight.  Objective: BP (!) 136/58 (BP Location: Right Arm)   Pulse 85   Temp 98 F (36.7 C) (Oral)    Resp 20   Ht 5' 8"  (1.727 m)   Wt 70.3 kg   SpO2 99%   BMI 23.57 kg/m   Examination:  General exam: Appears calm and comfortable Respiratory system: Clear to auscultation. Respiratory effort normal. Cardiovascular system: S1 & S2 heard, RRR. No murmurs, rubs, gallops or clicks. Gastrointestinal system: Abdomen is nondistended, soft and nontender. Normal bowel sounds heard. Central nervous system: Alert and oriented. No focal neurological deficits. Musculoskeletal: No edema. No calf tenderness Skin: No cyanosis. No rashes Psychiatry: Judgement and insight appear normal. Mood & affect appropriate.    Data Reviewed: I have personally reviewed following labs and imaging studies  CBC Lab Results  Component Value Date   WBC 5.0 10/21/2021   RBC 2.48 (L) 10/21/2021   HGB 7.6 (L) 10/21/2021   HCT 24.7 (L) 10/21/2021   MCV 99.6 10/21/2021   MCH 30.6 10/21/2021   PLT 415 (H) 10/21/2021   MCHC 30.8 10/21/2021   RDW 13.7 10/21/2021   LYMPHSABS 1.2 07/20/2021   MONOABS 0.8 07/20/2021   EOSABS 0.0 07/20/2021   BASOSABS 0.1 60/63/0160     Last metabolic panel Lab Results  Component Value Date   NA 135 10/19/2021   K 4.6 10/19/2021   CL 103 10/19/2021   CO2 24 10/19/2021   BUN 21 10/19/2021   CREATININE 0.78 10/19/2021   GLUCOSE 100 (H) 10/19/2021   GFRNONAA >60 10/19/2021   GFRAA >60 08/09/2019   CALCIUM 10.0 10/19/2021   PHOS 3.7 08/14/2020   PROT 7.1 10/19/2021   ALBUMIN 4.1 10/19/2021   BILITOT 0.3 10/19/2021   ALKPHOS 48 10/19/2021   AST 19 10/19/2021   ALT 10 10/19/2021   ANIONGAP 8 10/19/2021    GFR: Estimated Creatinine Clearance: 51.9 mL/min (by C-G formula  based on SCr of 0.78 mg/dL).  No results found for this or any previous visit (from the past 240 hour(s)).    Radiology Studies: CT ANGIO GI BLEED  Result Date: 10/19/2021 CLINICAL DATA:  Rectal bleeding. EXAM: CTA ABDOMEN AND PELVIS WITHOUT AND WITH CONTRAST TECHNIQUE: Multidetector CT imaging of  the abdomen and pelvis was performed using the standard protocol during bolus administration of intravenous contrast. Multiplanar reconstructed images and MIPs were obtained and reviewed to evaluate the vascular anatomy. RADIATION DOSE REDUCTION: This exam was performed according to the departmental dose-optimization program which includes automated exposure control, adjustment of the mA and/or kV according to patient size and/or use of iterative reconstruction technique. CONTRAST:  49m OMNIPAQUE IOHEXOL 350 MG/ML SOLN COMPARISON:  CT abdomen/pelvis 04/28/2020 FINDINGS: VASCULAR Aorta: There is scattered mild calcified atherosclerotic plaque in the nonaneurysmal abdominal aorta. There is no evidence of vasculitis or dissection. Celiac: There is moderate stenosis of the origin of the celiac artery (11-98). The remainder of the celiac artery and branches are patent without evidence of aneurysm, dissection, vasculitis, or other significant stenosis. SMA: Patent without evidence of aneurysm, dissection, vasculitis or significant stenosis. Renals: Both renal arteries are patent without evidence of aneurysm, dissection, vasculitis, fibromuscular dysplasia or significant stenosis. IMA: Patent without evidence of aneurysm, dissection, vasculitis or significant stenosis. Inflow: Patent without evidence of aneurysm, dissection, vasculitis or significant stenosis. Proximal Outflow: Bilateral common femoral and visualized portions of the superficial and profunda femoral arteries are patent without evidence of aneurysm, dissection, vasculitis or significant stenosis. Veins: The main portal and splenic veins are patent. There is no evidence of active extravasation Review of the MIP images confirms the above findings. NON-VASCULAR Lower chest: There is mild subsegmental atelectasis in the left lower lobe adjacent to the hiatal hernia. Mitral annular calcifications are noted. The imaged heart is otherwise unremarkable.  Hepatobiliary: The liver is unremarkable. The gallbladder is surgically absent. Pancreas: Unremarkable. Spleen: Unremarkable. Adrenals/Urinary Tract: The adrenals are unremarkable. The kidneys are unremarkable, with no focal lesion, stone, hydronephrosis, or hydroureter. The bladder is unremarkable. Stomach/Bowel: There is a moderate-sized hiatal hernia, similar to 2022. There is no evidence of bowel obstruction. Postsurgical changes are noted in the lower midabdomen. There is extensive sigmoid diverticulosis without evidence of acute diverticulitis. Lymphatic: There is no abdominal or pelvic lymphadenopathy. Reproductive: The uterus is surgically absent. There is no adnexal mass. Other: There is no ascites or free air. There is wide-mouth fat containing ventral abdominal hernia, unchanged. Musculoskeletal: There is no acute osseous abnormality or suspicious osseous lesion. There is multilevel degenerative change of the lumbar spine and asymmetrically advanced right hip degenerative change. IMPRESSION: 1. No evidence of active extravasation or other acute pathology in the abdomen or pelvis. 2. Moderate stenosis at the origin of the celiac artery. 3. Diverticulosis without evidence of acute diverticulitis. 4. Moderate-sized hiatal hernia. Electronically Signed   By: PValetta MoleM.D.   On: 10/19/2021 15:25      LOS: 0 days    RCordelia Poche MD [facility] 10/21/2021, 8:27 AM   If 7PM-7AM, please contact night-coverage [email or web address]

## 2021-10-22 ENCOUNTER — Other Ambulatory Visit (HOSPITAL_BASED_OUTPATIENT_CLINIC_OR_DEPARTMENT_OTHER): Payer: Self-pay

## 2021-10-22 ENCOUNTER — Encounter (HOSPITAL_BASED_OUTPATIENT_CLINIC_OR_DEPARTMENT_OTHER): Payer: Self-pay | Admitting: Pharmacist

## 2021-10-22 DIAGNOSIS — K921 Melena: Secondary | ICD-10-CM | POA: Diagnosis not present

## 2021-10-22 DIAGNOSIS — D649 Anemia, unspecified: Secondary | ICD-10-CM | POA: Diagnosis not present

## 2021-10-22 DIAGNOSIS — K922 Gastrointestinal hemorrhage, unspecified: Secondary | ICD-10-CM

## 2021-10-22 DIAGNOSIS — K51311 Ulcerative (chronic) rectosigmoiditis with rectal bleeding: Secondary | ICD-10-CM | POA: Diagnosis not present

## 2021-10-22 LAB — GLUCOSE, CAPILLARY: Glucose-Capillary: 92 mg/dL (ref 70–99)

## 2021-10-22 LAB — CBC
HCT: 25.9 % — ABNORMAL LOW (ref 36.0–46.0)
Hemoglobin: 8.1 g/dL — ABNORMAL LOW (ref 12.0–15.0)
MCH: 30.9 pg (ref 26.0–34.0)
MCHC: 31.3 g/dL (ref 30.0–36.0)
MCV: 98.9 fL (ref 80.0–100.0)
Platelets: 427 10*3/uL — ABNORMAL HIGH (ref 150–400)
RBC: 2.62 MIL/uL — ABNORMAL LOW (ref 3.87–5.11)
RDW: 13.7 % (ref 11.5–15.5)
WBC: 7.1 10*3/uL (ref 4.0–10.5)
nRBC: 0 % (ref 0.0–0.2)

## 2021-10-22 LAB — SURGICAL PATHOLOGY

## 2021-10-22 MED ORDER — MESALAMINE 4 G RE ENEM
4.0000 g | ENEMA | Freq: Every day | RECTAL | 0 refills | Status: DC
  Filled 2021-10-22: qty 2100, 35d supply, fill #0

## 2021-10-22 MED ORDER — ADULT MULTIVITAMIN W/MINERALS CH
1.0000 | ORAL_TABLET | Freq: Every day | ORAL | Status: DC
  Administered 2021-10-22: 1 via ORAL
  Filled 2021-10-22: qty 1

## 2021-10-22 MED ORDER — ENSURE ENLIVE PO LIQD
237.0000 mL | Freq: Two times a day (BID) | ORAL | Status: DC
  Administered 2021-10-22: 237 mL via ORAL

## 2021-10-22 NOTE — Progress Notes (Addendum)
     [facility] Progress Note  CC:  Anemia, ulcerative colitis/proctosigmoiditis  Subjective:  Feels ok.  She refused the Rowasa enema because she did not understand what it was for and thought it was to make her have a BM but she already had a lot of stools yesterday with the other enemas for the flex sig.  She agrees to try to use it from now on.    Objective:  Vital signs in last 24 hours: Temp:  [97.5 F (36.4 C)-98.1 F (36.7 C)] 98.1 F (36.7 C) (09/15 0458) Pulse Rate:  [73-109] 73 (09/15 0458) Resp:  [16-24] 18 (09/15 0458) BP: (100-169)/(31-69) 115/57 (09/15 0458) SpO2:  [98 %-100 %] 99 % (09/15 0742) Weight:  [70.3 kg] 70.3 kg (09/14 1237) Last BM Date : 10/21/21 General:  Alert, Well-developed, in NAD Heart:  Regular rate and rhythm; no murmurs Pulm:  CTAB.  No W/R/R. Abdomen:  Soft, non-distended.  BS present.  Non-tender.  Extremities:  Without edema. Neurologic:  Alert and oriented x 4;  grossly normal neurologically. Psych:  Alert and cooperative. Normal mood and affect.  Intake/Output from previous day: 09/14 0701 - 09/15 0700 In: 340 [P.O.:240; I.V.:100] Out: 800 [Urine:800]  Lab Results: Recent Labs    10/20/21 0418 10/21/21 0429 10/22/21 0409  WBC 6.1 5.0 7.1  HGB 7.5* 7.6* 8.1*  HCT 25.4* 24.7* 25.9*  PLT 412* 415* 427*   BMET Recent Labs    10/19/21 1203  NA 135  K 4.6  CL 103  CO2 24  GLUCOSE 100*  BUN 21  CREATININE 0.78  CALCIUM 10.0   LFT Recent Labs    10/19/21 1203  PROT 7.1  ALBUMIN 4.1  AST 19  ALT 10  ALKPHOS 48  BILITOT 0.3   Assessment / Plan: 1.  Acute on chronic anemia:  Hgb stable this AM at 8.1 grams. 2.  Proctosigmoiditis: Diagnosed via inpatient flex sig 11/2019 and maintained on Mesalamine 2.4 g daily and Canasa suppositories.  Flex sig 9/14 showed Proctosigmoid colitis. Inflammation was found from the anus to the sigmoid colon. This was graded as Mayo Score 2 (moderate disease).  Biopsied.  -Biopsies pending. -Plan is for oral and topical mesalamine in the form of enema.  Trying to avoid prednisone due to her age.  She is on PO mesalamine 2.4 grams daily.  ? Increase to 4.8 grams daily. -Declining full colonoscopy. -Can possibly go home later today vs tomorrow AM. -I advanced her diet to soft/low fiber. -She has a follow-up in our office on 10/3, which is in her discharge info.   LOS: 0 days   Laban Emperor. Zehr  10/22/2021, 9:31 AM  GI ATTENDING  Interval history data reviewed.  Endoscopy report reviewed.  Biopsies pending.  Agree with normal progress note as outlined above.  No significant GI bleeding.  The colitis was mild.  Agree with adding topical agent to oral agent (mesalamine).  Outpatient GI follow-up has been arranged.  Nothing further to add.  Okay to go home from our standpoint.  We will sign off.  Docia Chuck. Geri Seminole., M.D. [facility] Division of Gastroenterology

## 2021-10-22 NOTE — Progress Notes (Signed)
PT Cancellation Note  Patient Details Name: [name] MRN: [id number] DOB: [birth year]   Cancelled Treatment:    Reason Eval/Treat Not Completed: (P) Patient declined, no reason specified; pt attempting to toilet and requested PT return later. Will follow as schedule and pt status allows which may be another day.  Coolidge Breeze, PT, DPT [facility] Department Office: [phone]  Coolidge Breeze 10/22/2021, 12:19 PM

## 2021-10-22 NOTE — Plan of Care (Signed)

## 2021-10-22 NOTE — Progress Notes (Signed)
Pt refusing 2200 mesalamine enema after recent tapwater enemas earlier and having a large BM at 2000.  Educated and explained proposed plan of daily oral/enema over the next 3weeks per MD note.  Pt still not agreeable and would like to talk to dr before proceeding.  Shortly after pt had medium BM.  Will continue to monitor.

## 2021-10-22 NOTE — Discharge Summary (Signed)
Physician Discharge Summary  [name] GBT:[id number] DOB: March 15, 1934 DOA: 10/19/2021  PCP: Caren Macadam, MD (Inactive)  Admit date: 10/19/2021 Discharge date: 10/22/2021  Admitted From: ILF Disposition:  ILF  Recommendations for Outpatient Follow-up:  Follow up with PCP in 1-2 weeks Please obtain BMP/CBC in one week  Home Health: none Equipment/Devices: none  Discharge Condition: stable CODE STATUS: DNR  HPI: Per admitting MD,  [name] is a 87 y.o. female with medical history significant of chronic ulcerative rectosigmoid colitis, mulifactorial anemia, HTN, COPD,hypothyroidism who presents with rectal bleed. Pt also has blood tinged bowel movements daily but today it was significantly more blood than usual. However denies abdominal pain. No nausea or vomiting. No dizziness, chest pain or shortness of breath.  Had not had anymore rectal bleeding since being inpatient. In the ED, she was afebrile and normotensive.  Hemoglobin had notable from 3 months ago at 10.6-8.2.  FOBT was negative.  No significant electrolyte abnormalities.  Creatinine was normal. CTA abdomen showed no active extravasation.  Diverticulosis without diverticulitis.  Moderate stenosis of the celiac artery.  Hospital Course / Discharge diagnoses: Active Problems:   Hypothyroidism   Essential hypertension   COPD (chronic obstructive pulmonary disease) (HCC)   Chronic ulcerative rectosigmoiditis with rectal bleeding (HCC)   Acute on chronic anemia  Principal problem Acute on chronic normocytic anemia due to chronic ulcerative rectosigmoiditis-Presumably from blood loss but patient is without iron deficiency. Baseline hemoglobin of around 10-11.  Hemoglobin slightly lower during this hospitalization, but overall stable.  Gastroenterology consulted and followed patient while hospitalized.  Hemoglobin stabilized, did not require blood transfusion  Active problems Chronic ulcerative rectosigmoiditis with  rectal bleeding (HCC) -Acute rectal bleeding with associated acute on chronic anemia.  GI consulted.  She underwent a sigmoidoscopy on 9/14 which showed proctosigmoid colitis, inflammation from the anus to the sigmoid colon, moderate disease.  She was placed on her home oral mesalamine as well as mesalamine enemas.  Her bleeding has resolved, and with stable hemoglobin she was cleared for discharge by gastroenterology. COPD (chronic obstructive pulmonary disease) (HCC)-Stable. Hypothyroidism -Continue home levothyroxine  Sepsis ruled out   Discharge Instructions   Allergies as of 10/22/2021       Reactions   [place] Nut (berthollefia [place]) Skin Test Anaphylaxis   Fluconazole Shortness Of Breath   Metronidazole Shortness Of Breath, Nausea And Vomiting   Pepcid [famotidine] Shortness Of Breath, Other (See Comments)   Dizziness   Shellfish Allergy Anaphylaxis   Macrobid [nitrofurantoin]    REACTION: Syncope   Pantoprazole Other (See Comments)   CHEST PAIN        Medication List     STOP taking these medications    HYDROcodone-acetaminophen 5-325 MG tablet Commonly known as: NORCO/VICODIN       TAKE these medications    acetaminophen 500 MG tablet Commonly known as: TYLENOL Take 500 mg by mouth every 6 (six) hours as needed for moderate pain.   E-Z Spacer inhaler Use as instructed   fluticasone 220 MCG/ACT inhaler Commonly known as: Flovent HFA Inhale 1 puff into the lungs in the morning and at bedtime.   Iron 325 (65 Fe) MG Tabs Take 1 tablet by mouth daily.   levothyroxine 75 MCG tablet Commonly known as: SYNTHROID TAKE 1 TABLET BY MOUTH EVERY DAY   mesalamine 1.2 g EC tablet Commonly known as: LIALDA Take 2 tablets (2.4 g total) by mouth daily with breakfast. **PLEASE KEEP APPOINTMENT FOR REFILLS What changed: Another  medication with the same name was added. Make sure you understand how and when to take each.   mesalamine 4 g enema Commonly known  as: ROWASA Place 60 mLs (4 g total) rectally at bedtime. What changed: You were already taking a medication with the same name, and this prescription was added. Make sure you understand how and when to take each.        Follow-up Information     Willia Craze, NP Follow up on 11/09/2021.   Specialty: Gastroenterology Why: 10:45 AM Contact information: [place] [address] [phone]         Caren Macadam, MD Follow up in 1 week(s).   Specialty: Family Medicine Contact information: [place] [address] [phone]                 Consultations: GI  Procedures/Studies:  CT ANGIO GI BLEED  Result Date: 10/19/2021 CLINICAL DATA:  Rectal bleeding. EXAM: CTA ABDOMEN AND PELVIS WITHOUT AND WITH CONTRAST TECHNIQUE: Multidetector CT imaging of the abdomen and pelvis was performed using the standard protocol during bolus administration of intravenous contrast. Multiplanar reconstructed images and MIPs were obtained and reviewed to evaluate the vascular anatomy. RADIATION DOSE REDUCTION: This exam was performed according to the departmental dose-optimization program which includes automated exposure control, adjustment of the mA and/or kV according to patient size and/or use of iterative reconstruction technique. CONTRAST:  109m OMNIPAQUE IOHEXOL 350 MG/ML SOLN COMPARISON:  CT abdomen/pelvis 04/28/2020 FINDINGS: VASCULAR Aorta: There is scattered mild calcified atherosclerotic plaque in the nonaneurysmal abdominal aorta. There is no evidence of vasculitis or dissection. Celiac: There is moderate stenosis of the origin of the celiac artery (11-98). The remainder of the celiac artery and branches are patent without evidence of aneurysm, dissection, vasculitis, or other significant stenosis. SMA: Patent without evidence of aneurysm, dissection, vasculitis or significant stenosis. Renals: Both renal arteries are patent without evidence  of aneurysm, dissection, vasculitis, fibromuscular dysplasia or significant stenosis. IMA: Patent without evidence of aneurysm, dissection, vasculitis or significant stenosis. Inflow: Patent without evidence of aneurysm, dissection, vasculitis or significant stenosis. Proximal Outflow: Bilateral common femoral and visualized portions of the superficial and profunda femoral arteries are patent without evidence of aneurysm, dissection, vasculitis or significant stenosis. Veins: The main portal and splenic veins are patent. There is no evidence of active extravasation Review of the MIP images confirms the above findings. NON-VASCULAR Lower chest: There is mild subsegmental atelectasis in the left lower lobe adjacent to the hiatal hernia. Mitral annular calcifications are noted. The imaged heart is otherwise unremarkable. Hepatobiliary: The liver is unremarkable. The gallbladder is surgically absent. Pancreas: Unremarkable. Spleen: Unremarkable. Adrenals/Urinary Tract: The adrenals are unremarkable. The kidneys are unremarkable, with no focal lesion, stone, hydronephrosis, or hydroureter. The bladder is unremarkable. Stomach/Bowel: There is a moderate-sized hiatal hernia, similar to 2022. There is no evidence of bowel obstruction. Postsurgical changes are noted in the lower midabdomen. There is extensive sigmoid diverticulosis without evidence of acute diverticulitis. Lymphatic: There is no abdominal or pelvic lymphadenopathy. Reproductive: The uterus is surgically absent. There is no adnexal mass. Other: There is no ascites or free air. There is wide-mouth fat containing ventral abdominal hernia, unchanged. Musculoskeletal: There is no acute osseous abnormality or suspicious osseous lesion. There is multilevel degenerative change of the lumbar spine and asymmetrically advanced right hip degenerative change. IMPRESSION: 1. No evidence of active extravasation or other acute pathology in the abdomen or pelvis. 2. 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 3. Diverticulosis without evidence of acute diverticulitis. 4. Moderate-sized hiatal hernia. Electronically Signed   By: Valetta Mole M.D.   On: 10/19/2021 15:25     Subjective: - no chest pain, shortness of breath, no abdominal pain, nausea or vomiting.   Discharge Exam: BP 120/62 (BP Location: Left Arm)   Pulse 87   Temp 98.4 F (36.9 C) (Oral)   Resp 16   Ht 5' 8"  (1.727 m)   Wt 70.3 kg   SpO2 98%   BMI 23.57 kg/m   General: Pt is alert, awake, not in acute distress Cardiovascular: RRR, S1/S2 +, no rubs, no gallops Respiratory: CTA bilaterally, no wheezing, no rhonchi Abdominal: Soft, NT, ND, bowel sounds + Extremities: no edema, no cyanosis    The results of significant diagnostics from this hospitalization (including imaging, microbiology, ancillary and laboratory) are listed below for reference.     Microbiology: No results found for this or any previous visit (from the past 240 hour(s)).   Labs: Basic Metabolic Panel: Recent Labs  Lab 10/19/21 1203  NA 135  K 4.6  CL 103  CO2 24  GLUCOSE 100*  BUN 21  CREATININE 0.78  CALCIUM 10.0   Liver Function Tests: Recent Labs  Lab 10/19/21 1203  AST 19  ALT 10  ALKPHOS 48  BILITOT 0.3  PROT 7.1  ALBUMIN 4.1   CBC: Recent Labs  Lab 10/19/21 1203 10/20/21 0418 10/21/21 0429 10/22/21 0409  WBC 8.1 6.1 5.0 7.1  HGB 8.2* 7.5* 7.6* 8.1*  HCT 26.2* 25.4* 24.7* 25.9*  MCV 96.3 103.3* 99.6 98.9  PLT 534* 412* 415* 427*   CBG: Recent Labs  Lab 10/22/21 0745  GLUCAP 92   Hgb A1c No results for input(s): "HGBA1C" in the last 72 hours. Lipid Profile No results for input(s): "CHOL", "HDL", "LDLCALC", "TRIG", "CHOLHDL", "LDLDIRECT" in the last 72 hours. Thyroid function studies No results for input(s): "TSH", "T4TOTAL", "T3FREE", "THYROIDAB" in the last 72 hours.  Invalid input(s): "FREET3" Urinalysis    Component Value Date/Time   COLORURINE YELLOW  11/02/2020 [place] 11/02/2020 1820   LABSPEC 1.010 11/02/2020 1820   PHURINE 6.0 11/02/2020 1820   GLUCOSEU NEGATIVE 11/02/2020 1820   GLUCOSEU NEGATIVE 11/21/2016 1510   HGBUR NEGATIVE 11/02/2020 1820   BILIRUBINUR NEGATIVE 11/02/2020 1820   BILIRUBINUR negative 09/12/2019 1030   KETONESUR NEGATIVE 11/02/2020 1820   PROTEINUR NEGATIVE 11/02/2020 1820   UROBILINOGEN 0.2 09/12/2019 1030   UROBILINOGEN 0.2 06/26/2017 1546   NITRITE NEGATIVE 11/02/2020 1820   LEUKOCYTESUR TRACE (A) 11/02/2020 1820    FURTHER DISCHARGE INSTRUCTIONS:   Get Medicines reviewed and adjusted: Please take all your medications with you for your next visit with your Primary MD   Laboratory/radiological data: Please request your Primary MD to go over all hospital tests and procedure/radiological results at the follow up, please ask your Primary MD to get all Hospital records sent to his/her office.   In some cases, they will be blood work, cultures and biopsy results pending at the time of your discharge. Please request that your primary care M.D. goes through all the records of your hospital data and follows up on these results.   Also Note the following: If you experience worsening of your admission symptoms, develop shortness of breath, life threatening emergency, suicidal or homicidal thoughts you must seek medical attention immediately by calling 911 or calling your MD immediately  if symptoms less severe.  You must read complete instructions/literature along with all the possible adverse reactions/side effects for all the Medicines you take and that have been prescribed to you. Take any new Medicines after you have completely understood and accpet all the possible adverse reactions/side effects.    Do not drive when taking Pain medications or sleeping medications (Benzodaizepines)   Do not take more than prescribed Pain, Sleep and Anxiety Medications. It is not advisable to combine  anxiety,sleep and pain medications without talking with your primary care practitioner   Special Instructions: If you have smoked or chewed Tobacco  in the last 2 yrs please stop smoking, stop any regular Alcohol  and or any Recreational drug use.   Wear Seat belts while driving.   Please note: You were cared for by a hospitalist during your hospital stay. Once you are discharged, your primary care physician will handle any further medical issues. Please note that NO REFILLS for any discharge medications will be authorized once you are discharged, as it is imperative that you return to your primary care physician (or establish a relationship with a primary care physician if you do not have one) for your post hospital discharge needs so that they can reassess your need for medications and monitor your lab values.  Time coordinating discharge: 40 minutes  SIGNED:  Marzetta Board, MD, PhD 10/22/2021, 1:58 PM

## 2021-10-22 NOTE — Progress Notes (Signed)
Nutrition Education Note  RD consulted for nutrition education regarding ulcerative colitis.  RD provided "IBD nutrition therapy" handout from [employer]. Reviewed patient's dietary recall. Provided examples of low and high fiber foods. Discouraged intake of high fiber foods, high fat foods, spicy foods, processed foods, caffeine and red meats when having symptoms or taking steroids. Encouraged pt to cook foods until they are soft and chew foods well to help aid in digestion. Also recommend frequent small meals. Encouraged use of a multi-vitamin and protein supplements. Pt is agreeable to receiving vanilla Ensure supplements as she reports she has tolerated dairy in the past.    Expect good compliance. Pt reports knowing what her dietary restrictions are and she tries to eat as much fruit and vegetables as she can. Knows to avoid fibrous foods and raw fruits and vegetables.  Body mass index is 23.57 kg/m. Pt meets criteria for normal based on current BMI.  Current diet order is full liquids. Labs and medications reviewed. No further nutrition interventions warranted at this time.  If additional nutrition issues arise, please re-consult RD.  Clayton Bibles, MS, RD, LDN Inpatient Clinical Dietitian Contact information available via Amion

## 2021-10-23 ENCOUNTER — Encounter: Payer: Self-pay | Admitting: Gastroenterology

## 2021-10-24 ENCOUNTER — Encounter (HOSPITAL_COMMUNITY): Payer: Self-pay | Admitting: Gastroenterology

## 2021-10-25 ENCOUNTER — Other Ambulatory Visit (HOSPITAL_BASED_OUTPATIENT_CLINIC_OR_DEPARTMENT_OTHER): Payer: Self-pay

## 2021-11-01 ENCOUNTER — Encounter: Payer: Self-pay | Admitting: Family Medicine

## 2021-11-01 ENCOUNTER — Telehealth: Payer: Self-pay | Admitting: Pharmacist

## 2021-11-01 ENCOUNTER — Ambulatory Visit (INDEPENDENT_AMBULATORY_CARE_PROVIDER_SITE_OTHER): Payer: Medicare Other | Admitting: Family Medicine

## 2021-11-01 VITALS — BP 120/60 | HR 93 | Temp 98.3°F | Ht 68.0 in

## 2021-11-01 DIAGNOSIS — D500 Iron deficiency anemia secondary to blood loss (chronic): Secondary | ICD-10-CM

## 2021-11-01 DIAGNOSIS — K5130 Ulcerative (chronic) rectosigmoiditis without complications: Secondary | ICD-10-CM | POA: Diagnosis not present

## 2021-11-01 LAB — CBC
HCT: 26.1 % — ABNORMAL LOW (ref 36.0–46.0)
Hemoglobin: 8.7 g/dL — ABNORMAL LOW (ref 12.0–15.0)
MCHC: 33.4 g/dL (ref 30.0–36.0)
MCV: 91.1 fl (ref 78.0–100.0)
Platelets: 445 10*3/uL — ABNORMAL HIGH (ref 150.0–400.0)
RBC: 2.86 Mil/uL — ABNORMAL LOW (ref 3.87–5.11)
RDW: 14.3 % (ref 11.5–15.5)
WBC: 6.5 10*3/uL (ref 4.0–10.5)

## 2021-11-01 NOTE — Progress Notes (Signed)
Her hemoglobin is getting better, up to 8.6 from 8.1. While she does not need a blood transfusiion at this time I still encouraged them to speak with the GI physician next week to ask about diffierent medication they could try

## 2021-11-01 NOTE — Chronic Care Management (AMB) (Signed)
    Chronic Care Management Pharmacy Assistant   Name: [name]  MRN: [id number] DOB: [birth year]  Reason for Encounter: Disease State / COPD Assessment Call   Conditions to be addressed/monitored: COPD  Recent office visits:  None  Recent consult visits:  None  Hospital visits:  Admitted  to [facility] 10/19/2021 due to Chronic ulcerative rectosigmoiditis with rectal bleeding. Discharge date was 10/22/2021.    New?Medications Started at [facility] Discharge:?? Mesalamine Medication Changes at Hospital Discharge: No medication changes Medications Discontinued at Hospital Discharge: Norco Medications that remain the same after Hospital Discharge:??  -All other medications will remain the same.    Medications: Outpatient Encounter Medications as of 11/01/2021  Medication Sig   acetaminophen (TYLENOL) 500 MG tablet Take 500 mg by mouth every 6 (six) hours as needed for moderate pain.   Ferrous Sulfate (IRON) 325 (65 FE) MG TABS Take 1 tablet by mouth daily.    fluticasone (FLOVENT HFA) 220 MCG/ACT inhaler Inhale 1 puff into the lungs in the morning and at bedtime.   levothyroxine (SYNTHROID) 75 MCG tablet TAKE 1 TABLET BY MOUTH EVERY DAY   mesalamine (LIALDA) 1.2 g EC tablet Take 2 tablets (2.4 g total) by mouth daily with breakfast. **PLEASE KEEP APPOINTMENT FOR REFILLS   mesalamine (ROWASA) 4 g enema Place 60 mLs (4 g total) rectally at bedtime.   Spacer/Aero-Holding Chambers (E-Z SPACER) inhaler Use as instructed   No facility-administered encounter medications on file as of 11/01/2021.  Fill History: LEVOTHYROXINE 75 MCG TABLET 07/04/2021 90   mesalamine (ROWASA) 4 g enema 10/25/2021 35    Current COPD regimen:            Flovent HFA 220 mcg/act           EZ spacer  Called patient for follow up COPD assessment call, patient had a transfer of care appointment this morning with Dr. [name].  Assessment call was not completed due to her visit today with  her PCP. Follow up appointment with Jeni Salles, Clinical Pharmacist was scheduled for 12/22/2021.   Care Gaps: AWV - scheduled 01/24/2022 Last BP - 120/60 on 11/01/2021  Star Rating Drugs: None  [place]  Clinical Pharmacist Assistant [phone]

## 2021-11-01 NOTE — Progress Notes (Signed)
Established Patient Office Visit  Subjective   Patient ID: [name], female    DOB: 04/08/1934  Age: 87 y.o. MRN: [id number]  Chief Complaint  Patient presents with  . Establish Care    Patien ts here for transition of care visit. She reports that she was recently in the hospital for rectal bleeding from her colitis. States that she is chronically anemic-- states her last Hb was 8.1 from 7.5 when in hospital. States she has an appointment with her GI physician next week. States she constantly feels dizzy like she is going to pass out. States they gave her mesalamine tablets and suppositories without a lot of improvement. States she is still having liquid BM's and bleeding in her stool.   Current Outpatient Medications  Medication Instructions  . acetaminophen (TYLENOL) 500 mg, Oral, Every 6 hours PRN  . Ferrous Sulfate (IRON) 325 (65 FE) MG TABS 1 tablet, Oral, Daily  . fluticasone (FLOVENT HFA) 220 MCG/ACT inhaler 1 puff, Inhalation, 2 times daily  . levothyroxine (SYNTHROID) 75 MCG tablet TAKE 1 TABLET BY MOUTH EVERY DAY  . mesalamine (LIALDA) 2.4 g, Oral, Daily with breakfast, **PLEASE KEEP APPOINTMENT FOR REFILLS  . mesalamine (ROWASA) 4 g, Rectal, Daily at bedtime  . Spacer/Aero-Holding Chambers (E-Z SPACER) inhaler Use as instructed     Patient Active Problem List   Diagnosis Date Noted  . Acute on chronic anemia 10/20/2021  . Rectal bleeding 10/19/2021  . Symptomatic anemia 10/13/2020  . GIB (gastrointestinal bleeding) 03/11/2020  . Chronic ulcerative rectosigmoiditis with rectal bleeding ([place])   . Abnormal CT scan, gastrointestinal tract   . LLQ abdominal pain 10/31/2019  . Diverticulitis 10/31/2019  . UTI (urinary tract infection) 08/06/2019  . COPD (chronic obstructive pulmonary disease) ([place])   . Ulcerative colitis ([place])   . Vitamin D deficiency 05/01/2019  . Arthralgia 08/21/2017  . Osteoporosis 11/21/2016  . Postmenopausal status 09/06/2016  . Other  nonspecific abnormal finding of lung field 08/14/2015  . Hyponatremia 07/21/2014  . Menopausal state 07/18/2014  . Hematest positive stools 08/05/2013  . Anemia, blood loss 08/05/2013  . Cerumen impaction 06/03/2013  . History of adrenal disorder 10/15/2012  . Rash and nonspecific skin eruption 10/15/2012  . Iron deficiency anemia 06/20/2012  . Psychogenic polydipsia 06/20/2012  . Nonspecific abnormal electrocardiogram (ECG) (EKG) 06/20/2012  . Polyuria 05/02/2012  . Pelvic pain in female 08/16/2011  . Leg ulcer ([place]) 06/16/2010  . Routine general medical examination at a health care facility 06/11/2010  . Encounter for long-term (current) use of other medications 06/11/2010  . HYPERCHOLESTEROLEMIA 06/05/2009  . UNSPECIFIED URINARY CALCULUS 05/28/2008  . CHEST PAIN, ATYPICAL 05/28/2008  . ABDOMINAL PAIN, CHRONIC 05/28/2008  . ASYMPTOMATIC POSTMENOPAUSAL STATUS 05/28/2008  . SOMATIZATION DISORDER 04/25/2007  . Hypothyroidism 09/21/2006  . Essential hypertension 09/21/2006  . ALLERGIC RHINITIS 09/21/2006  . ASTHMA 09/21/2006  . GERD 09/21/2006  . Disorder resulting from impaired renal function 09/21/2006  . DIVERTICULITIS, HX OF 09/21/2006      Review of Systems  All other systems reviewed and are negative.     Objective:     BP 120/60 (BP Location: Left Arm, Patient Position: Sitting, Cuff Size: Normal)   Pulse 93   Temp 98.3 F (36.8 C) (Oral)   Ht 5' 8"  (1.727 m)   SpO2 98%   BMI 23.57 kg/m  {Vitals History (Optional):23777}  Physical Exam Vitals reviewed.  Constitutional:      Appearance: Normal appearance. She is well-groomed and normal weight.  HENT:     Head: Normocephalic and atraumatic.  Eyes:     Extraocular Movements: Extraocular movements intact.     Conjunctiva/sclera: Conjunctivae normal.     Pupils: Pupils are equal, round, and reactive to light.  Cardiovascular:     Rate and Rhythm: Normal rate and regular rhythm.     Pulses: Normal pulses.      Heart sounds: S1 normal and S2 normal.  Pulmonary:     Effort: Pulmonary effort is normal.     Breath sounds: Normal breath sounds and air entry.  Abdominal:     General: Abdomen is flat. Bowel sounds are normal.     Palpations: Abdomen is soft.  Musculoskeletal:        General: Normal range of motion.     Cervical back: Normal range of motion and neck supple.     Right lower leg: No edema.     Left lower leg: No edema.  Skin:    General: Skin is warm and dry.  Neurological:     Mental Status: She is alert and oriented to person, place, and time. Mental status is at baseline.     Gait: Gait is intact.  Psychiatric:        Mood and Affect: Mood and affect normal.        Speech: Speech normal.        Behavior: Behavior normal.        Judgment: Judgment normal.     No results found for any visits on 11/01/21.  {Labs (Optional):23779}  The ASCVD Risk score (Arnett DK, et al., 2019) failed to calculate for the following reasons:   The 2019 ASCVD risk score is only valid for ages 49 to 73    Assessment & Plan:   Problem List Items Addressed This Visit       Digestive   Ulcerative colitis ([place]) - Primary     Other   Iron deficiency anemia   Relevant Orders   CBC (no diff)    No follow-ups on file.    Farrel Conners, MD

## 2021-11-02 NOTE — Assessment & Plan Note (Addendum)
Secondary to chronic blood loss from UC, checking CBC today. Continue ferrous sulfate 325 mg daily, I recommended she add 500 mg vitamin C daily to help with absorption of the iron, however pt states this causes GI upset.

## 2021-11-02 NOTE — Assessment & Plan Note (Signed)
On mesalamine tablets and suppositories, however pt comments that she continues to have bleeding in her loose BM's. Will check another CBC today to compare with her previous Hb after she left the hospital. She already has an appointment set up with the GI physician next week. I encouraged her to discuss the continued bleeding with him to see if there is a different medication they can try.

## 2021-11-08 ENCOUNTER — Emergency Department (HOSPITAL_BASED_OUTPATIENT_CLINIC_OR_DEPARTMENT_OTHER)
Admission: EM | Admit: 2021-11-08 | Discharge: 2021-11-08 | Disposition: A | Discharge status: Home / Self Care | Payer: Medicare Other | Attending: Emergency Medicine | Admitting: Emergency Medicine

## 2021-11-08 ENCOUNTER — Other Ambulatory Visit: Payer: Self-pay

## 2021-11-08 DIAGNOSIS — K5130 Ulcerative (chronic) rectosigmoiditis without complications: Secondary | ICD-10-CM | POA: Diagnosis not present

## 2021-11-08 DIAGNOSIS — E039 Hypothyroidism, unspecified: Secondary | ICD-10-CM | POA: Insufficient documentation

## 2021-11-08 DIAGNOSIS — N3000 Acute cystitis without hematuria: Secondary | ICD-10-CM

## 2021-11-08 DIAGNOSIS — J449 Chronic obstructive pulmonary disease, unspecified: Secondary | ICD-10-CM | POA: Insufficient documentation

## 2021-11-08 DIAGNOSIS — K51311 Ulcerative (chronic) rectosigmoiditis with rectal bleeding: Secondary | ICD-10-CM | POA: Diagnosis not present

## 2021-11-08 DIAGNOSIS — Z79899 Other long term (current) drug therapy: Secondary | ICD-10-CM | POA: Diagnosis not present

## 2021-11-08 DIAGNOSIS — K625 Hemorrhage of anus and rectum: Secondary | ICD-10-CM | POA: Diagnosis present

## 2021-11-08 DIAGNOSIS — I10 Essential (primary) hypertension: Secondary | ICD-10-CM | POA: Insufficient documentation

## 2021-11-08 DIAGNOSIS — K6389 Other specified diseases of intestine: Secondary | ICD-10-CM

## 2021-11-08 DIAGNOSIS — D75839 Thrombocytosis, unspecified: Secondary | ICD-10-CM | POA: Insufficient documentation

## 2021-11-08 LAB — COMPREHENSIVE METABOLIC PANEL
ALT: 10 U/L (ref 0–44)
AST: 18 U/L (ref 15–41)
Albumin: 4 g/dL (ref 3.5–5.0)
Alkaline Phosphatase: 38 U/L (ref 38–126)
Anion gap: 9 (ref 5–15)
BUN: 14 mg/dL (ref 8–23)
CO2: 26 mmol/L (ref 22–32)
Calcium: 9.7 mg/dL (ref 8.9–10.3)
Chloride: 100 mmol/L (ref 98–111)
Creatinine, Ser: 0.63 mg/dL (ref 0.44–1.00)
GFR, Estimated: >60 mL/min (ref >60)
Glucose, Bld: 91 mg/dL (ref 70–99)
Potassium: 4.2 mmol/L (ref 3.5–5.1)
Sodium: 135 mmol/L (ref 135–145)
Total Bilirubin: 0.2 mg/dL — ABNORMAL LOW (ref 0.3–1.2)
Total Protein: 6.9 g/dL (ref 6.5–8.1)

## 2021-11-08 LAB — CBC WITH DIFFERENTIAL/PLATELET
Abs Immature Granulocytes: 0.01 10*3/uL (ref 0.00–0.07)
Basophils Absolute: 0.1 10*3/uL (ref 0.0–0.1)
Basophils Relative: 1 %
Eosinophils Absolute: 0 10*3/uL (ref 0.0–0.5)
Eosinophils Relative: 0 %
HCT: 26.6 % — ABNORMAL LOW (ref 36.0–46.0)
Hemoglobin: 8.4 g/dL — ABNORMAL LOW (ref 12.0–15.0)
Immature Granulocytes: 0 %
Lymphocytes Relative: 20 %
Lymphs Abs: 1.3 10*3/uL (ref 0.7–4.0)
MCH: 29.7 pg (ref 26.0–34.0)
MCHC: 31.6 g/dL (ref 30.0–36.0)
MCV: 94 fL (ref 80.0–100.0)
Monocytes Absolute: 1 10*3/uL (ref 0.1–1.0)
Monocytes Relative: 16 %
Neutro Abs: 4 10*3/uL (ref 1.7–7.7)
Neutrophils Relative %: 63 %
Platelets: 431 10*3/uL — ABNORMAL HIGH (ref 150–400)
RBC: 2.83 MIL/uL — ABNORMAL LOW (ref 3.87–5.11)
RDW: 14.9 % (ref 11.5–15.5)
WBC: 6.4 10*3/uL (ref 4.0–10.5)
nRBC: 0 % (ref 0.0–0.2)

## 2021-11-08 LAB — URINALYSIS, ROUTINE W REFLEX MICROSCOPIC
Bilirubin Urine: NEGATIVE
Glucose, UA: NEGATIVE mg/dL
Hgb urine dipstick: NEGATIVE
Ketones, ur: NEGATIVE mg/dL
Nitrite: NEGATIVE
Protein, ur: NEGATIVE mg/dL
Specific Gravity, Urine: 1.012 (ref 1.005–1.030)
pH: 6.5 (ref 5.0–8.0)

## 2021-11-08 LAB — OCCULT BLOOD X 1 CARD TO LAB, STOOL: Fecal Occult Bld: POSITIVE — AB

## 2021-11-08 LAB — SEDIMENTATION RATE: Sed Rate: 38 mm/hr — ABNORMAL HIGH (ref 0–22)

## 2021-11-08 MED ORDER — CEFADROXIL 500 MG PO CAPS: 500.0000 mg | ORAL_CAPSULE | Freq: Two times a day (BID) | ORAL | 0 refills | Status: DC

## 2021-11-08 NOTE — ED Notes (Signed)
Urine sample at bedside if ordered.

## 2021-11-08 NOTE — ED Provider Notes (Signed)
[facility] EMERGENCY DEPT Provider Note   CSN: [id number] Arrival date & time: 11/08/21  1106     History  Chief Complaint  Patient presents with   Rectal Bleeding    [name] is a 87 y.o. female with past medical history significant for chronic diverticulitis/rectosigmoiditis of ulcerative nature, normocytic anemia, rectal bleeding, COPD, hypothyroidism, hypertension who presents with concern for worsening left lower quadrant pain, increasing rectal bleeding.  Patient was recently seen at the hospital, treated for this condition, she has been taking oral mesalamine, as well as using mesalamine enemas each night.  She has noticed increased rectal bleeding for the last 5 days, reports that it is generally after her enema, but today in the last couple of days with significant increased left lower quadrant pain as well.  She denies any fever, chills, increased weakness, shortness of breath, chest pain.  She reports that she has continued to use her treatments as needed.  She has not establish care with a GI doctor, reports that she has an appointment to see 1 tomorrow that had been scheduled prior to her recent hospitalization.  She had decreased from hemoglobin of around 10 to low end of 7.5 prior to recent hospitalization, and hemoglobin had started to improve.  Patient with history of complication of colonoscopy with removal of "one quarter of my lower intestine" many years ago, and has had chronic issues since then.   Rectal Bleeding Associated symptoms: abdominal pain        Home Medications Prior to Admission medications   Medication Sig Start Date End Date Taking? Authorizing Provider  cefadroxil (DURICEF) 500 MG capsule Take 1 capsule (500 mg total) by mouth 2 (two) times daily. 11/08/21  Yes Brondon Wann H, PA-C  acetaminophen (TYLENOL) 500 MG tablet Take 500 mg by mouth every 6 (six) hours as needed for moderate pain.    [provider]  Ferrous  Sulfate (IRON) 325 (65 FE) MG TABS Take 1 tablet by mouth daily.     [provider]  fluticasone (FLOVENT HFA) 220 MCG/ACT inhaler Inhale 1 puff into the lungs in the morning and at bedtime. 10/01/21   Farrel Conners, MD  levothyroxine (SYNTHROID) 75 MCG tablet TAKE 1 TABLET BY MOUTH EVERY DAY 04/05/21   Koberlein, Steele Berg, MD  mesalamine (LIALDA) 1.2 g EC tablet Take 2 tablets (2.4 g total) by mouth daily with breakfast. **PLEASE KEEP APPOINTMENT FOR REFILLS 10/08/21   Willia Craze, NP  mesalamine (ROWASA) 4 g enema Place 60 mLs (4 g total) rectally at bedtime. 10/22/21 11/29/21  Caren Griffins, MD  Spacer/Aero-Holding Chambers (E-Z SPACER) inhaler Use as instructed 11/08/17   Caren Macadam, MD      Allergies    [place] nut (berthollefia excelsa) skin test, Fluconazole, Metronidazole, Pepcid [famotidine], Shellfish allergy, Macrobid [nitrofurantoin], and Pantoprazole    Review of Systems   Review of Systems  Gastrointestinal:  Positive for abdominal pain, blood in stool and hematochezia.  All other systems reviewed and are negative.   Physical Exam Updated Vital Signs BP (!) 141/66   Pulse 71   Temp 97.8 F (36.6 C)   Resp 18   Ht 5' 8"  (1.727 m)   Wt 63.5 kg   SpO2 98%   BMI 21.29 kg/m  Physical Exam Vitals and nursing note reviewed.  Constitutional:      General: She is not in acute distress.    Appearance: Normal appearance. She is ill-appearing.  Comments: Somewhat chronically ill-appearing, no significant acute distress, she does have some evidence of some possible edematous changes with some edema under her eyes  HENT:     Head: Normocephalic and atraumatic.  Eyes:     General:        Right eye: No discharge.        Left eye: No discharge.  Cardiovascular:     Rate and Rhythm: Normal rate and regular rhythm.     Heart sounds: No murmur heard.    No friction rub. No gallop.  Pulmonary:     Effort: Pulmonary effort is normal.     Breath  sounds: Normal breath sounds.  Abdominal:     General: Bowel sounds are normal.     Palpations: Abdomen is soft.     Comments: Tenderness to palpation in the left lower quadrant without rebound, rigidity, guarding, appropriately healing surgical scars noted both transversely from prior C-section as well as a large vertical scar extending from suprapubic to above umbilicus.  Genitourinary:    Comments: Normal appearance of external rectum, no blood on her diaper, no significant stool or blood in rectal vault at time of my evaluation, no active rectal bleeding noted. Skin:    General: Skin is warm and dry.     Capillary Refill: Capillary refill takes less than 2 seconds.  Neurological:     Mental Status: She is alert and oriented to person, place, and time.  Psychiatric:        Mood and Affect: Mood normal.        Behavior: Behavior normal.     ED Results / Procedures / Treatments   Labs (all labs ordered are listed, but only abnormal results are displayed) Labs Reviewed  CBC WITH DIFFERENTIAL/PLATELET - Abnormal; Notable for the following components:      Result Value   RBC 2.83 (*)    Hemoglobin 8.4 (*)    HCT 26.6 (*)    Platelets 431 (*)    All other components within normal limits  COMPREHENSIVE METABOLIC PANEL - Abnormal; Notable for the following components:   Total Bilirubin 0.2 (*)    All other components within normal limits  OCCULT BLOOD X 1 CARD TO LAB, STOOL - Abnormal; Notable for the following components:   Fecal Occult Bld POSITIVE (*)    All other components within normal limits  SEDIMENTATION RATE - Abnormal; Notable for the following components:   Sed Rate 38 (*)    All other components within normal limits  URINALYSIS, ROUTINE W REFLEX MICROSCOPIC - Abnormal; Notable for the following components:   APPearance HAZY (*)    Leukocytes,Ua LARGE (*)    Bacteria, UA MANY (*)    All other components within normal limits  POC OCCULT BLOOD, ED     EKG None  Radiology No results found.  Procedures Procedures    Medications Ordered in ED Medications - No data to display  ED Course/ Medical Decision Making/ A&P                            Medical Decision Making Amount and/or Complexity of Data Reviewed Labs: ordered.  Risk Prescription drug management.   This patient is a 87 y.o. female who presents to the ED for concern of urinary pressure, frequency, dysuria, ongoing rectal bleeding, and lower abdominal pain, this involves an extensive number of treatment options, and is a complaint that carries with it a high  risk of complications and morbidity. The emergent differential diagnosis prior to evaluation includes, but is not limited to, ongoing rectosigmoiditis, new abscess, perforation, urinary tract infection, pyelonephritis, nephrolithiasis, other intra-abdominal infection versus other.   This is not an exhaustive differential.   Past Medical History / Co-morbidities / Social History: chronic diverticulitis/rectosigmoiditis of ulcerative nature, normocytic anemia, rectal bleeding, COPD, hypothyroidism, hypertension  Additional history: Chart reviewed. Pertinent results include: Reviewed outpatient GI work-up, patient's recent admission  Physical Exam: Physical exam performed. The pertinent findings include: Patient is in no significant distress, stable vital signs, she has no active bleeding from rectum, no significant stool in the rectal vault, her Hemoccult is positive for blood however.  She is some generalized edema, and chronic ill-appearance without evidence of sepsis or toxicity, no rebound, rigidity, guarding or evidence of peritonitis on abdominal exam.  Lab Tests: I ordered, and personally interpreted labs.  The pertinent results include: Hemoglobin overall stable, 8.7-8.4 over the last 7 days.  No other significant abnormalities on CBC other than mild thrombocytosis with platelets 431.  UA shows large  hemoglobin, white blood cells, many bacteria in context of her dysuria, urinary frequency, and urinary pressure I do think that this is representative of an acute urinary tract infection and I recommend treatment for acute cystitis.  CMP is unremarkable, Hemoccult positive.  Sed rate is elevated.   Consultations Obtained: I requested consultation with the [facility] team, spoke with Azucena Freed, PA-C,  and discussed lab and imaging findings as well as pertinent plan - they recommend: They recommend patient keep her appointment tomorrow for further evaluation of her ongoing abdominal pain and rectal bleeding, likely will have a prednisone taper, or increased dose of mesalamine but do not recommend any significant adjustment of her medications in the emergency department today.  They requested that we get a sed rate today.   Disposition: After consideration of the diagnostic results and the patients response to treatment, I feel that patient appears stable for discharge at this time with significant return precautions planned, and plan for follow-up with GI.   emergency department workup does not suggest an emergent condition requiring admission or immediate intervention beyond what has been performed at this time. The plan is: as above. The patient is safe for discharge and has been instructed to return immediately for worsening symptoms, change in symptoms or any other concerns.  I discussed this case with my attending physician Dr. Ronnald Nian who cosigned this note including patient's presenting symptoms, physical exam, and planned diagnostics and interventions. Attending physician stated agreement with plan or made changes to plan which were implemented.    Final Clinical Impression(s) / ED Diagnoses Final diagnoses:  Rectosigmoiditis  Acute cystitis without hematuria    Rx / DC Orders ED Discharge Orders          Ordered    cefadroxil (DURICEF) 500 MG capsule  2 times daily        11/08/21  1449              Adoria Kawamoto, [place] H, PA-C 11/08/21 1459    Lennice Sites, DO 11/08/21 1605

## 2021-11-08 NOTE — ED Triage Notes (Signed)
Pt to ED c/o increased rectal bleeding x 3 days.Reports bright red blood.  Recently d/c from hospital for rectal bleeding. Reports chronic rectal bleeding.

## 2021-11-08 NOTE — Discharge Instructions (Addendum)
As we discussed please take the entire course of antibiotics to treat your urinary tract infection, and follow-up with your GI doctor in the morning to discuss treatment for your ongoing abdominal pain and rectal bleeding, they will likely increase your dose of mesalamine or start you on a prednisone taper based on our discussion today but they may change that treatment based on assessing you tomorrow.  In the meantime I would stick to a gentle diet, and continue to monitor bowel movements, if you have persistent rectal bleeding that does not stop, or continues after bowel movements I recommend returning to the emergency department for further evaluation.

## 2021-11-08 NOTE — ED Notes (Signed)
Discharge paperwork given and verbally understood. 

## 2021-11-09 ENCOUNTER — Encounter: Payer: Self-pay | Admitting: Nurse Practitioner

## 2021-11-09 ENCOUNTER — Ambulatory Visit (INDEPENDENT_AMBULATORY_CARE_PROVIDER_SITE_OTHER): Payer: Medicare Other | Admitting: Nurse Practitioner

## 2021-11-09 VITALS — BP 110/60 | HR 88 | Ht 68.0 in

## 2021-11-09 DIAGNOSIS — K6389 Other specified diseases of intestine: Secondary | ICD-10-CM | POA: Diagnosis not present

## 2021-11-09 MED ORDER — PREDNISONE 10 MG PO TABS: ORAL_TABLET | ORAL | 0 refills | Status: DC

## 2021-11-09 NOTE — Progress Notes (Signed)
Chief Complaint:  Hospital follow up    [place]   # 87 yo female with proctosigmoiditis diagnosed in 2021. She was maintaining on Lialda 2.4 grams daily until several weeks ago when she began having increased rectal bleeding / decline in hgb resulting in hospital admission. Flexible sigmoidoscopy with biopsies >> moderately active chronic proctocolitis. Mesalamine suppositories added to oral mesalamine. She hasn't have any improvement in symptoms.  At this point I doubt that maximizing lialda dose will provide significant improvement. Can discontinue suppositories since they haven't helped. Start long prednisone taper, see AVS for tapering schedule. Depending on response will consider Budesonide Continue lialda 2.4 grams daily Return to see me in 4 weeks.   # Acute on chronic anemia. Recent decline in hgb from 10.6 to 8.2 in setting of rectal bleeding related to flare in proctocolitis. She is on chronic oral iron . Followed by Hematology, Dr. Alen Blew for IDA.  Hgb stable at 8.4 but well below her baseline in mid 10 range. She feels weak. PCP double dose of iron last week. Hopefully rectal bleeding will stop with the prednisone.     HPI   CAROLL WEINHEIMER is a 87 y.o. female known to Dr.  Silverio Decamp with a past medical history significant for chronic ulcerative rectosigmoid colitis, multifactorial anemia, hiatal hernia, hypertension, COPD and hypothyroidism . See PMH /PSH for additional history.   Ms Franchini was formally diagnosed with proctosigmoiditis by flexible sigmoidoscopy with biopsies in 2021 (she had a colonoscopy in 2015 with endoscopic findings of possible proctitis but wasn't confirmed by biopsies). She had been maintained on Mesalamine 2.4 grams daily. She was doing okay on this regimen though continued to have some rectal bleeding with bowel movements.    Patient was hospitalized a couple of weeks ago with increased rectal bleeding. Our 10/20/21 consult note was reviewed. Her  hgb had declined from 10.6>> 8.2. CTA negative for active bleeding. Underwent flex sigmoidoscopy with biopsies with findings of moderately active chronic proctocolitis. There was stool throughout the colon. Post -procedure she was started on mesalmine suppositories and continue on lialda 2. 4 grams daily.    Interval History: She is here with her son. She has been using mesalamine suppositories since hospital discharge and is still taking oral mesalamine. Her stools are formed, soft. She hasn't had any improvement in the rectal bleeding. She used to just pass blood with bowel movements but now passes blood whether or not she has a BM . She has crampy lower abdominal discomfort.  She didn't require a blood transfusion in the hospital, hgb stayed above 7.  Her hgb is from yesterday's labs is stable at 8.4 but down from baseline of 10.6. She has taken oral iron for years,  PCP doubled the dose last week. Ms Giarratano prefers to never have a repeat lower endoscopy. She tells me that ~ 15 year ago she had to have part of her colon removed after a colonoscopy ??  She was started on antibiotics last night for a UTI diagnosed by Urgent Care.    Previous GI Evaluation   10/21/21 Flexible sigmoidoscopy  -Stool throughout the colon  - Proctosigmoid colitis. Inflammation was found from the anus to the sigmoid colon. This was graded as Mayo Score 2 (moderate disease). Biopsied. - Non-bleeding non-thrombosed internal hemorrhoids. Path:  A. COLON, LEFT SIDE, BIOPSY:  - Moderately active chronic proctocolitis, consistent with patient's  clinical history of ulcerative colitis  - Negative for granulomas or dysplasia  Imaging   CTA abd / pelvis without and with contrast IMPRESSION: 1. No evidence of active extravasation or other acute pathology in the abdomen or pelvis. 2. Moderate stenosis at the origin of the celiac artery. 3. Diverticulosis without evidence of acute diverticulitis. 4. Moderate-sized hiatal  hernia.    Labs:     Latest Ref Rng & Units 11/08/2021   11:45 AM 11/01/2021   12:25 PM 10/22/2021    4:09 AM  CBC  WBC 4.0 - 10.5 K/uL 6.4  6.5  7.1   Hemoglobin 12.0 - 15.0 g/dL 8.4  8.7 Repeated and verified X2.  8.1   Hematocrit 36.0 - 46.0 % 26.6  26.1  25.9   Platelets 150 - 400 K/uL 431  445.0  427        Latest Ref Rng & Units 11/08/2021   11:45 AM 10/19/2021   12:03 PM 11/02/2020    4:25 PM  Hepatic Function  Total Protein 6.5 - 8.1 g/dL 6.9  7.1  7.0   Albumin 3.5 - 5.0 g/dL 4.0  4.1  3.9   AST 15 - 41 U/L _0 ALT 0 - 44 U/L _1 Alk Phosphatase 38 - 126 U/L 38  48  43   Total Bilirubin 0.3 - 1.2 mg/dL 0.2  0.3  0.4      Past Medical History:  Diagnosis Date   ABDOMINAL PAIN, CHRONIC 05/28/2008   ALLERGIC RHINITIS 09/21/2006   ANEMIA-NOS 09/21/2006   ASTHMA 09/21/2006   ASYMPTOMATIC POSTMENOPAUSAL STATUS 05/28/2008   Cataract    CHEST PAIN, ATYPICAL 05/28/2008   COPD (chronic obstructive pulmonary disease) ([place])    COVID-19 virus infection 02/2020   DDD (degenerative disc disease)    DIVERTICULITIS, HX OF 09/21/2006   GERD 09/21/2006   HYPERCHOLESTEROLEMIA 06/05/2009   HYPERTENSION 09/21/2006   HYPOTHYROIDISM 09/21/2006   Peptic ulcer disease    Purpura ([place])    RENAL INSUFFICIENCY 09/21/2006   Shingles    Small bowel obstruction ([place])    Somatization disorder 04/25/2007   UNSPECIFIED URINARY CALCULUS 05/28/2008   UTI (urinary tract infection) 07/2019    Past Surgical History:  Procedure Laterality Date   ABDOMINAL HYSTERECTOMY  1981   acute Nephritis  [place]   BIOPSY  11/18/2019   Procedure: BIOPSY;  Surgeon: Ladene Artist, MD;  Location: [facility];  Service: Endoscopy;;   BIOPSY  10/21/2021   Procedure: BIOPSY;  Surgeon: Irving Copas., MD;  Location: [facility];  Service: Gastroenterology;;   [place]  N/A 11/18/2019   Procedure: FLEXIBLE SIGMOIDOSCOPY;  Surgeon: Ladene Artist, MD;  Location: [facility];  Service: Endoscopy;  Laterality: N/A;   FLEXIBLE SIGMOIDOSCOPY N/A 10/21/2021   Procedure: FLEXIBLE SIGMOIDOSCOPY;  Surgeon: Rush Landmark Telford Nab., MD;  Location: [facility];  Service: Gastroenterology;  Laterality: N/A;   NASAL SINUS SURGERY  1967   Pulmonary thrombosis     TONSILLECTOMY     TUBAL LIGATION      Current Medications, Allergies, Family History and Social History were reviewed in [employer] record.     Current Outpatient Medications  Medication Sig Dispense Refill   acetaminophen (TYLENOL) 500 MG tablet Take 500 mg by mouth every 6 (six) hours as needed for moderate pain.  cefadroxil (DURICEF) 500 MG capsule Take 1 capsule (500 mg total) by mouth 2 (two) times daily. 14 capsule 0   Ferrous Sulfate (IRON) 325 (65 FE) MG TABS Take 1 tablet by mouth daily.      fluticasone (FLOVENT HFA) 220 MCG/ACT inhaler Inhale 1 puff into the lungs in the morning and at bedtime. 1 each 5   levothyroxine (SYNTHROID) 75 MCG tablet TAKE 1 TABLET BY MOUTH EVERY DAY 90 tablet 1   mesalamine (LIALDA) 1.2 g EC tablet Take 2 tablets (2.4 g total) by mouth daily with breakfast. **PLEASE KEEP APPOINTMENT FOR REFILLS 180 tablet 0   mesalamine (ROWASA) 4 g enema Place 60 mLs (4 g total) rectally at bedtime. 2100 mL 0   Spacer/Aero-Holding Chambers (E-Z SPACER) inhaler Use as instructed 1 each 2   No current facility-administered medications for this visit.    Review of Systems: No chest pain. No shortness of breath. No urinary complaints.    Physical Exam  Wt Readings from Last 3 Encounters:  11/08/21 140 lb (63.5 kg)  10/21/21 154 lb 15.7 oz (70.3 kg)  07/20/21 152 lb 11.2 oz (69.3 kg)    Ht _0  (1.727 m)   BMI 21.29 kg/m  Constitutional:  Generally well appearing female in no acute distress, in a wheelchair. Psychiatric: Pleasant. Normal mood and  affect. Behavior is normal. EENT: Pupils normal.  Conjunctivae are normal. No scleral icterus. Neck supple.  Cardiovascular: Normal rate, regular rhythm. No edema Pulmonary/chest: Effort normal and breath sounds normal. No wheezing, rales or rhonchi. Abdominal: Examined in wheelchair. Abdomen soft, nondistended, nontender. Bowel sounds active throughout.  Neurological: Alert and oriented to person place and time. Skin: Pale.  [name], NP  11/09/2021, 10:37 AM

## 2021-11-09 NOTE — Patient Instructions (Addendum)
  Prednisone 10 mg tablets , # 100 tablets.  Take 40 mg daily x 7 days. Then, decrease dose by 5 mg every 5 days. Once you get down to 36m please stay at that dose until you are seen for follow up.  -  If you are age 3987or older, your body mass index should be between 23-30. Your Body mass index is 21.29 kg/m. If this is out of the aforementioned range listed, please consider follow up with your Primary Care Provider.  If you are age 3945or younger, your body mass index should be between 19-25. Your Body mass index is 21.29 kg/m. If this is out of the aformentioned range listed, please consider follow up with your Primary Care Provider.   ________________________________________________________  The [place] [facility] providers would like to encourage you to use [facility] communicate with providers for non-urgent requests or questions.  Due to long hold times on the telephone, sending your provider a message by [facility] be a faster and more efficient way to get a response.  Please allow 48 business hours for a response.  Please remember that this is for non-urgent requests.  _______________________________________________________   We have scheduled your follow up appointment with PNevin Bloodgoodfor 12/16/21 at 11:30 am.  We have sent the following medications to your pharmacy for you to pick up at your convenience: Prednisone.  It was a pleasure to see you today!  Thank you for trusting me with your gastrointestinal care!

## 2021-12-06 ENCOUNTER — Other Ambulatory Visit: Payer: Self-pay | Admitting: Nurse Practitioner

## 2021-12-08 ENCOUNTER — Ambulatory Visit: Payer: Medicare Other | Admitting: Gastroenterology

## 2021-12-16 ENCOUNTER — Ambulatory Visit (INDEPENDENT_AMBULATORY_CARE_PROVIDER_SITE_OTHER): Payer: Medicare Other | Admitting: Nurse Practitioner

## 2021-12-16 ENCOUNTER — Encounter: Payer: Self-pay | Admitting: Nurse Practitioner

## 2021-12-16 VITALS — BP 120/60 | HR 98 | Ht 68.0 in | Wt 140.0 lb

## 2021-12-16 DIAGNOSIS — K6389 Other specified diseases of intestine: Secondary | ICD-10-CM | POA: Diagnosis not present

## 2021-12-16 MED ORDER — BUDESONIDE 3 MG PO CPEP: 9.0000 mg | ORAL_CAPSULE | Freq: Every day | ORAL | 3 refills | Status: DC

## 2021-12-16 NOTE — Progress Notes (Signed)
Chief Complaint: Follow-up on proctosigmoiditis   Assessment &  Plan   # 87 yo female with chronic proctocolitis on Lialda 2.4 g. Moderately active chronic disease on flexible sigmoidoscopy with biopsies September 2023 done for evaluation of rectal bleeding. She has had resolution of symptoms with steroid taper.  Will keep her on lialda 2.4 gram daily.  She just completed the last 5 days of prednisone 10 mg daily.  Trial of Budesonide 9 mg daily  Follow up with Dr. Silverio Decamp in 2 months. Call in the interim if needed  # Chronic anemia, history of IDA probably related to proctosigmoiditis. Followed by Hematology, on oral iron.    HPI   [name] is a 87 y.o. female known to Dr.  Silverio Decamp with a past medical history significant for chronic proctosigmoiditis, chronic anemia, hiatal hernia, hypertension, COPD and hypothyroidism . See PMH /PSH for additional history.   [name] is back with her soon for follow up. She was last seen on 11/09/21, please refer to that note for details.  In summary, she has been hospitalized with flare in proctosigmoiditis.  When I saw her in clinic she was not doing well on low-dose of mesalamine plus mesalamine.  The topical mesalamine was discontinued, she was given a prednisone taper and is here for follow-up.    Interval History :  Over the last few weeks on prednisone she has had resolution of symptoms though it interfered with sleep.  Her stools are now formed, having 1 bowel movement a day.  She still sees a few flecks of blood in her stool sometimes.  She has several general medical symptoms such as discomfort in her eyes, burning in her feet, facial pain.  These are not new issues for her.  Per son, she seems to had more energy over the last few days  Previous GI Evaluation   10/21/21 Flexible sigmoidoscopy  -Stool throughout the colon  - Proctosigmoid colitis. Inflammation was found from the anus to the sigmoid colon. This was graded as Mayo Score 2  (moderate disease). Biopsied. - Non-bleeding non-thrombosed internal hemorrhoids. Path:  A. COLON, LEFT SIDE, BIOPSY:  - Moderately active chronic proctocolitis, consistent with patient's  clinical history of ulcerative colitis  - Negative for granulomas or dysplasia    Imaging    10/21/21 Flexible sigmoidoscopy  -Stool throughout the colon  - Proctosigmoid colitis. Inflammation was found from the anus to the sigmoid colon. This was graded as Mayo Score 2 (moderate disease). Biopsied. - Non-bleeding non-thrombosed internal hemorrhoids. Path:  A. COLON, LEFT SIDE, BIOPSY:  - Moderately active chronic proctocolitis, consistent with patient's  clinical history of ulcerative colitis  - Negative for granulomas or dysplasia     CTA abd / pelvis without and with contrast IMPRESSION: 1. No evidence of active extravasation or other acute pathology in the abdomen or pelvis. 2. Moderate stenosis at the origin of the celiac artery. 3. Diverticulosis without evidence of acute diverticulitis. 4. Moderate-sized hiatal hernia.   Labs:     Latest Ref Rng & Units 11/08/2021   11:45 AM 11/01/2021   12:25 PM 10/22/2021    4:09 AM  CBC  WBC 4.0 - 10.5 K/uL 6.4  6.5  7.1   Hemoglobin 12.0 - 15.0 g/dL 8.4  8.7 Repeated and verified X2.  8.1   Hematocrit 36.0 - 46.0 % 26.6  26.1  25.9   Platelets 150 - 400 K/uL 431  445.0  427  Latest Ref Rng & Units 11/08/2021   11:45 AM 10/19/2021   12:03 PM 11/02/2020    4:25 PM  Hepatic Function  Total Protein 6.5 - 8.1 g/dL 6.9  7.1  7.0   Albumin 3.5 - 5.0 g/dL 4.0  4.1  3.9   AST 15 - 41 U/L _0 ALT 0 - 44 U/L _1 Alk Phosphatase 38 - 126 U/L 38  48  43   Total Bilirubin 0.3 - 1.2 mg/dL 0.2  0.3  0.4      Past Medical History:  Diagnosis Date   ABDOMINAL PAIN, CHRONIC 05/28/2008   ALLERGIC RHINITIS 09/21/2006   ANEMIA-NOS 09/21/2006   ASTHMA 09/21/2006   ASYMPTOMATIC POSTMENOPAUSAL STATUS 05/28/2008   Cataract    CHEST  PAIN, ATYPICAL 05/28/2008   COPD (chronic obstructive pulmonary disease) ([place])    COVID-19 virus infection 02/2020   DDD (degenerative disc disease)    DIVERTICULITIS, HX OF 09/21/2006   GERD 09/21/2006   HYPERCHOLESTEROLEMIA 06/05/2009   HYPERTENSION 09/21/2006   HYPOTHYROIDISM 09/21/2006   Peptic ulcer disease    Purpura ([place])    RENAL INSUFFICIENCY 09/21/2006   Shingles    Small bowel obstruction ([place])    Somatization disorder 04/25/2007   UNSPECIFIED URINARY CALCULUS 05/28/2008   UTI (urinary tract infection) 07/2019   UTI (urinary tract infection) 11/08/2021    Past Surgical History:  Procedure Laterality Date   ABDOMINAL HYSTERECTOMY  1981   acute Nephritis  [place]   BIOPSY  11/18/2019   Procedure: BIOPSY;  Surgeon: Ladene Artist, MD;  [facility]: [facility];  Service: Endoscopy;;   BIOPSY  10/21/2021   Procedure: BIOPSY;  Surgeon: Irving Copas., MD;  Location: [facility];  Service: Gastroenterology;;   [place] N/A 11/18/2019   Procedure: FLEXIBLE SIGMOIDOSCOPY;  Surgeon: Ladene Artist, MD;  Location: [facility];  Service: Endoscopy;  Laterality: N/A;   FLEXIBLE SIGMOIDOSCOPY N/A 10/21/2021   Procedure: FLEXIBLE SIGMOIDOSCOPY;  Surgeon: Rush Landmark Telford Nab., MD;  Location: [facility];  Service: Gastroenterology;  Laterality: N/A;   NASAL SINUS SURGERY  1967   Pulmonary thrombosis     TONSILLECTOMY     TUBAL LIGATION      Current Medications, Allergies, Family History and Social History were reviewed in [employer] record.     Current Outpatient Medications  Medication Sig Dispense Refill   acetaminophen (TYLENOL) 500 MG tablet Take 500 mg by mouth every 6 (six) hours as needed for moderate pain.     Ferrous Sulfate (IRON) 325 (65 FE) MG TABS Take 1 tablet by mouth daily.      fluticasone (FLOVENT  HFA) 220 MCG/ACT inhaler Inhale 1 puff into the lungs in the morning and at bedtime. 1 each 5   levothyroxine (SYNTHROID) 75 MCG tablet TAKE 1 TABLET BY MOUTH EVERY DAY 90 tablet 1   mesalamine (LIALDA) 1.2 g EC tablet Take 2 tablets (2.4 g total) by mouth daily with breakfast. **PLEASE KEEP APPOINTMENT FOR REFILLS 180 tablet 0   predniSONE (DELTASONE) 10 MG tablet TAKE 40 MG DAILY FOR 7 DAYS. THEN DECREASE DOSE BY 5 MG EVERY 5 DAYS. ONCE DOWN TO 10 MG STAY AT THAT DOSE UNTIL FURTHER NOTICE. 100 tablet 1   Spacer/Aero-Holding Chambers (E-Z SPACER) inhaler Use  as instructed 1 each 2   cefadroxil (DURICEF) 500 MG capsule Take 1 capsule (500 mg total) by mouth 2 (two) times daily. (Patient not taking: Reported on 12/16/2021) 14 capsule 0   mesalamine (ROWASA) 4 g enema Place 60 mLs (4 g total) rectally at bedtime. 2100 mL 0   No current facility-administered medications for this visit.    Review of Systems: No chest pain. No shortness of breath. No urinary complaints.    Physical Exam  Wt Readings from Last 3 Encounters:  12/16/21 140 lb (63.5 kg)  11/08/21 140 lb (63.5 kg)  10/21/21 154 lb 15.7 oz (70.3 kg)    BP 120/60   Pulse 98   Ht _0  (1.727 m)   Wt 140 lb (63.5 kg)   BMI 21.29 kg/m  Constitutional:  Generally well appearing female in wheelchair in no acute distress. Psychiatric: Pleasant. Normal mood and affect. Behavior is normal. EENT: Pupils normal.  Conjunctivae are normal. No scleral icterus. Neck supple.  Cardiovascular: Normal rate, regular rhythm. No edema Pulmonary/chest: Effort normal and breath sounds normal. No wheezing, rales or rhonchi. Abdominal: Limited exam in wc . Abdomen soft, nondistended, nontender. Bowel sounds active throughout. There are no masses palpable. No hepatomegaly. Neurological: Alert and oriented to person place and time. Skin: Skin is warm and dry. No rashes noted.  [name], NP  12/16/2021, 11:49 AM

## 2021-12-16 NOTE — Patient Instructions (Addendum)
We have sent the following medications to your pharmacy for you to pick up at your convenience: Budesonide   Follow up with Dr. Silverio Decamp on : 03/10/22 at 11:00 am   _______________________________________________________  If you are age 85 or older, your body mass index should be between 23-30. Your Body mass index is 21.29 kg/m. If this is out of the aforementioned range listed, please consider follow up with your Primary Care Provider.  If you are age 64 or younger, your body mass index should be between 19-25. Your Body mass index is 21.29 kg/m. If this is out of the aformentioned range listed, please consider follow up with your Primary Care Provider.   ____________________________________________________  The [place] [facility] providers would like to encourage you to use [facility] to communicate with providers for non-urgent requests or questions.  Due to long hold times on the telephone, sending your provider a message by [facility] may be a faster and more efficient way to get a response.  Please allow 48 business hours for a response.  Please remember that this is for non-urgent requests.  _____________________________________________________ Thank you for choosing me and [facility].

## 2021-12-21 ENCOUNTER — Telehealth: Payer: Self-pay | Admitting: Pharmacist

## 2021-12-21 NOTE — Chronic Care Management (AMB) (Signed)
    Chronic Care Management Pharmacy Assistant   Name: VELENCIA LENART  MRN: [id number] DOB: [birth year]  12/22/2021 APPOINTMENT REMINDER  Deberah Castle was reminded to have all medications, supplements and any blood glucose and blood pressure readings available for review with Jeni Salles, Pharm. D, at her telephone visit on 12/22/2021 at 10:00.  Care Gaps: AWV - scheduled 01/24/2022 Last BP - 120/60 on 12/16/2021 Covid - never done AWV - due soon Pneumonia - postponed Shingrix - postponed Flu - postponed  Star Rating Drug: None   Any gaps in medications fill history? No  Gennie Alma [facility]  Catering manager [phone]

## 2021-12-21 NOTE — Progress Notes (Addendum)
Chronic Care Management Pharmacy Note  12/22/2021 Name:  [name] MRN:  [id number] DOB:  10-03-1934  Summary: BP is at goal < 140/90 per home readings TSH not at goal of 2.5-4.5   Recommendations/Changes made from today's visit: -Recommended repeat TSH -Recommend restarting vitamin D 1000 units daily -Consider repeat DEXA -Recommended PFTs given query diagnosis for COPD/asthma   Plan: Follow up COPD/asthma assessment in 2-3 months   Subjective: [name] is an 87 y.o. year old female who is a primary patient of Legrand Como, Royston Cowper, MD.  The CCM team was consulted for assistance with disease management and care coordination needs.    Engaged with patient by telephone for follow up visit in response to provider referral for pharmacy case management and/or care coordination services.   Consent to Services:  The patient was given information about Chronic Care Management services, agreed to services, and gave verbal consent prior to initiation of services.  Please see initial visit note for detailed documentation.   Patient Care Team: Farrel Conners, MD as PCP - General (Family Medicine) Jari Pigg, MD as Attending Physician (Dermatology) Clent Jacks, MD (Ophthalmology) Viona Gilmore, [facility] as Pharmacist (Pharmacist)  Recent office visits: 11/01/21 [name], MD: Patient presented to establish care.   Recent consult visits: 12/16/21 [name], NP (gastroenterology): Patient presented for proctosigmoiditis. Continue with Lialda 2.4 g daily. Trial of budesonide 9 mg daily. Follow up in 2 months.  11/09/21 [name], NP (gastroenterology): Patient presented for proctosigmoiditis and GI bleeding. Prescribed prednisone taper.  07/20/21 Zola Button MD (onconlogy): Patient presented for symptomatic anemia. No medication changes. Follow up in 6 months.  Hospital visits: 11/08/21 Patient presented to [facility] ED for rectosigmoiditis. Patient  was presented for 3 hours. Prescribed cefadroxil BID.  Admitted to [facility] 10/19/2021 due to Chronic ulcerative rectosigmoiditis with rectal bleeding. Discharge date was 10/22/2021.    New?Medications Started at [facility] Discharge:?? Mesalamine Medication Changes at Hospital Discharge: No medication changes Medications Discontinued at Hospital Discharge: Norco Medications that remain the same after Hospital Discharge:??  -All other medications will remain the same.   Objective:  Lab Results  Component Value Date   CREATININE 0.63 11/08/2021   BUN 14 11/08/2021   GFR 93.60 07/31/2019   GFRNONAA >60 11/08/2021   GFRAA >60 08/09/2019   NA 135 11/08/2021   K 4.2 11/08/2021   CALCIUM 9.7 11/08/2021   CO2 26 11/08/2021   GLUCOSE 91 11/08/2021    Lab Results  Component Value Date/Time   HGBA1C 5.6 06/21/2013 11:45 AM   GFR 93.60 07/31/2019 02:18 PM   GFR 78.58 07/03/2019 02:22 PM    Last diabetic Eye exam: No results found for: "HMDIABEYEEXA"  Last diabetic Foot exam: No results found for: "HMDIABFOOTEX"   Lab Results  Component Value Date   CHOL 170 12/11/2019   HDL 56 12/11/2019   LDLCALC 87 12/11/2019   LDLDIRECT 120.8 06/05/2009   TRIG 178 (H) 12/11/2019   CHOLHDL 3.0 12/11/2019       Latest Ref Rng & Units 11/08/2021   11:45 AM 10/19/2021   12:03 PM 11/02/2020    4:25 PM  Hepatic Function  Total Protein 6.5 - 8.1 g/dL 6.9  7.1  7.0   Albumin 3.5 - 5.0 g/dL 4.0  4.1  3.9   AST 15 - 41 U/L _0 ALT 0 - 44 U/L _1 Alk Phosphatase 38 -  126 U/L 38  48  43   Total Bilirubin 0.3 - 1.2 mg/dL 0.2  0.3  0.4     Lab Results  Component Value Date/Time   TSH 5.436 (H) 12/04/2020 11:29 AM   TSH 6.320 (H) 11/02/2020 04:25 PM   TSH 0.11 (L) 08/14/2020 04:18 PM   TSH 2.81 12/11/2019 10:51 AM   FREET4 0.86 11/02/2020 04:25 PM       Latest Ref Rng & Units 11/08/2021   11:45 AM 11/01/2021   12:25 PM 10/22/2021    4:09 AM  CBC  WBC 4.0 - 10.5  K/uL 6.4  6.5  7.1   Hemoglobin 12.0 - 15.0 g/dL 8.4  8.7 Repeated and verified X2.  8.1   Hematocrit 36.0 - 46.0 % 26.6  26.1  25.9   Platelets 150 - 400 K/uL 431  445.0  427     Lab Results  Component Value Date/Time   VD25OH 31 08/14/2020 04:18 PM   VD25OH 50.44 07/31/2019 02:18 PM   VD25OH 21.66 (L) 04/30/2019 02:49 PM    Clinical ASCVD: No  The ASCVD Risk score (Arnett DK, et al., 2019) failed to calculate for the following reasons:   The 2019 ASCVD risk score is only valid for ages 17 to 75       11/01/2021   11:40 AM 01/22/2021   11:20 AM 01/22/2020   11:32 AM  Depression screen PHQ 2/9  Decreased Interest 2 0 0  Down, Depressed, Hopeless 0 0 0  PHQ - 2 Score 2 0 0  Altered sleeping 1  0  Tired, decreased energy 1  0  Change in appetite 0  0  Feeling bad or failure about yourself  1  0  Trouble concentrating 0  0  Moving slowly or fidgety/restless 2  0  Suicidal thoughts 1  0  PHQ-9 Score 8  0  Difficult doing work/chores Very difficult  Not difficult at all        No data to display              No data to display            Social History   Tobacco Use  Smoking Status Never  Smokeless Tobacco Never   BP Readings from Last 3 Encounters:  12/16/21 120/60  11/09/21 110/60  11/08/21 (!) 149/66   Pulse Readings from Last 3 Encounters:  12/16/21 98  11/09/21 88  11/08/21 70   Wt Readings from Last 3 Encounters:  12/16/21 140 lb (63.5 kg)  11/08/21 140 lb (63.5 kg)  10/21/21 154 lb 15.7 oz (70.3 kg)   BMI Readings from Last 3 Encounters:  12/16/21 21.29 kg/m  11/09/21 21.29 kg/m  11/08/21 21.29 kg/m    Assessment/Interventions: Review of patient past medical history, allergies, medications, health status, including review of consultants reports, laboratory and other test data, was performed as part of comprehensive evaluation and provision of chronic care management services.   SDOH:  (Social Determinants of Health) assessments and  interventions performed: Yes (last 07/28/21) SDOH Interventions    Flowsheet Row Office Visit from 11/01/2021 in [place] at [place] [facility] from 07/28/2021 in [place] at [place] [facility] from 12/28/2020 in [place] at [place] from 01/22/2020 in [place] at [place] Interventions -- -- -- Intervention Not Indicated  Housing Interventions -- -- -- Intervention Not Indicated  Transportation Interventions -- --  Intervention Not Indicated Intervention Not Indicated  Depression Interventions/Treatment  Currently on Treatment -- -- PHQ2-9 Score <4 Follow-up Not Indicated  Financial Strain Interventions -- Intervention Not Indicated Intervention Not Indicated Intervention Not Indicated  Physical Activity Interventions -- -- -- Intervention Not Indicated  Stress Interventions -- -- -- Intervention Not Indicated  Social Connections Interventions -- -- -- Intervention Not Indicated       SDOH Screenings   Food Insecurity: No Food Insecurity (10/20/2021)  Housing: Low Risk  (10/20/2021)  Transportation Needs: No Transportation Needs (10/20/2021)  Utilities: Not At Risk (10/20/2021)  Alcohol Screen: Low Risk  (01/22/2020)  Depression (PHQ2-9): Medium Risk (11/01/2021)  Financial Resource Strain: Low Risk  (08/04/2021)  Physical Activity: Sufficiently Active (01/22/2021)  Social Connections: Moderately Integrated (01/22/2021)  Stress: No Stress Concern Present (01/22/2021)  Tobacco Use: Low Risk  (12/16/2021)    CCM Care Plan  Allergies  Allergen Reactions   [place] Nut Daisey Must) Skin Test Anaphylaxis   Fluconazole Shortness Of Breath   Metronidazole Shortness Of Breath and Nausea And Vomiting   Pepcid [Famotidine] Shortness Of Breath and Other (See Comments)    Dizziness   Shellfish Allergy Anaphylaxis   Macrobid [Nitrofurantoin]     REACTION:  Syncope   Pantoprazole Other (See Comments)    CHEST PAIN    Medications Reviewed Today     Reviewed by Viona Gilmore, [facility] (Pharmacist) on 12/22/21 at 1013  Med List Status: <None>   Medication Order Taking? Sig Documenting Provider Last Dose Status Informant  acetaminophen (TYLENOL) 500 MG tablet [id number] Yes Take 500 mg by mouth every 6 (six) hours as needed for moderate pain. [provider] Taking Active Self  budesonide (ENTOCORT EC) 3 MG 24 hr capsule [id number] Yes Take 3 capsules (9 mg total) by mouth daily. Willia Craze, NP Taking Active   Ferrous Sulfate (IRON) 325 (65 FE) MG TABS [id number] Yes Take 1 tablet by mouth daily.  [provider] Taking Active Self  fluticasone (FLOVENT HFA) 220 MCG/ACT inhaler [id number] Yes Inhale 1 puff into the lungs in the morning and at bedtime. Farrel Conners, MD Taking Active Self  levothyroxine (SYNTHROID) 75 MCG tablet [id number]  TAKE 1 TABLET BY MOUTH EVERY DAY Koberlein, Junell C, MD  Active Self  mesalamine (LIALDA) 1.2 g EC tablet [id number]  Take 2 tablets (2.4 g total) by mouth daily with breakfast. **PLEASE KEEP APPOINTMENT FOR REFILLS Willia Craze, NP  Active Self  mesalamine (ROWASA) 4 g enema [id number]  Place 60 mLs (4 g total) rectally at bedtime. Caren Griffins, MD  Expired 11/29/21 2359   Spacer/Aero-Holding Chambers (E-Z SPACER) inhaler [id number]  Use as instructed Caren Macadam, MD  Active Self            Patient Active Problem List   Diagnosis Date Noted   Symptomatic anemia 10/13/2020   Chronic ulcerative rectosigmoiditis with rectal bleeding ([place])    Diverticulitis 10/31/2019   COPD (chronic obstructive pulmonary disease) ([place])    Ulcerative colitis ([place])    Vitamin D deficiency 05/01/2019   Arthralgia 08/21/2017   Osteoporosis 11/21/2016   Postmenopausal status 09/06/2016   Other nonspecific abnormal finding of lung field 08/14/2015   History of adrenal disorder 10/15/2012    Iron deficiency anemia 06/20/2012   Psychogenic polydipsia 06/20/2012   Nonspecific abnormal electrocardiogram (ECG) (EKG) 06/20/2012   Pelvic pain in female 08/16/2011   Leg ulcer ([place]) 06/16/2010   Encounter for long-term (current) use of other medications  06/11/2010   HYPERCHOLESTEROLEMIA 06/05/2009   UNSPECIFIED URINARY CALCULUS 05/28/2008   SOMATIZATION DISORDER 04/25/2007   Hypothyroidism 09/21/2006   Essential hypertension 09/21/2006   ALLERGIC RHINITIS 09/21/2006   ASTHMA 09/21/2006   GERD 09/21/2006    Immunization History  Administered Date(s) Administered   Fluad Quad(high Dose 65+) 11/23/2018   Influenza Whole 12/08/2008   Influenza, High Dose Seasonal PF 10/22/2013, 11/12/2016   Influenza,inj,Quad PF,6+ Mos 11/08/2017   Influenza-Unspecified 11/23/2018   Pneumococcal Polysaccharide-23 05/09/2002, 07/18/2014   Td 05/09/2003   Tdap 08/15/2016   Zoster, Live 06/21/2013   Patient reports her stomach is doing somewhat better. She reports she has a sore mouth and she thinks this is from her medications. She is having insomnia and thinks this is coming from her medications as well.  Patient is finished with the prednisone. She thinks the budesonide is ok so far. She has been doing it like they told her to. Patient is not having to pay for the budesonide as far as she is aware and is not having any bleeding.   Patient is not having much constipation with the iron. She is not doing infusions and she is thankful for that.  Patient is not cooking huge amounts of food with living alone. She doesn't do dark greens vegetables often because of gastroparesis. She is doing more liquid and baby food. She is sticking to her diet religiously. Patient doesn't eat much meat. She tries to eat different foods when she can.  Patient does feel tired some days but not always. She cannot fall asleep sometimes. Her vitals today are BP 140/66 HR 93, temp 98.2. She checks this every once in a  while.  She has some dizziness and she thinks its because of her eyes and might need to go to the eye doctor. Patient reports this is sporadic. She thinks she has dry eyes some days.  Conditions to be addressed/monitored:  Hypertension, GERD, COPD, Hypothyroidism, Osteoporosis, and UC  Conditions addressed this visit: Hypertension, COPD  Care Plan : CCM Pharmacy Care Plan  Updates made by Viona Gilmore, [place] since 12/22/2021 12:00 AM     Problem: Problem: Hypertension, GERD, COPD, Hypothyroidism, Osteoporosis, and UC      Long-Range Goal: Patient-Specific Goal   Start Date: 12/28/2020  Expected End Date: 12/28/2021  Recent Progress: On track  Priority: High  Note:   Current Barriers:  Unable to independently monitor therapeutic efficacy Unable to achieve control of TSH   Pharmacist Clinical Goal(s):  Patient will achieve adherence to monitoring guidelines and medication adherence to achieve therapeutic efficacy achieve control of thyroid as evidenced by TSH  through collaboration with PharmD and provider.   Interventions: 1:1 collaboration with Farrel Conners, MD regarding development and update of comprehensive plan of care as evidenced by provider attestation and co-signature Inter-disciplinary care team collaboration (see longitudinal plan of care) Comprehensive medication review performed; medication list updated in electronic medical record  Hypertension (BP goal <140/90) -Controlled -Current treatment: No medications -Medications previously tried: none  -Current home readings: 140/66; uses an arm cuff every 1-2 weeks -Current dietary habits: has had low sodium in the past and eats saltines to increase intake -Current exercise habits: tries to be as active as possible -Reports hypotensive/hypertensive symptoms -Educated on Importance of home blood pressure monitoring; Proper BP monitoring technique; -Counseled to monitor BP at home weekly, document, and  provide log at future appointments -Counseled on diet and exercise extensively  COPD/asthma (Goal: control symptoms and prevent exacerbations) -  Not ideally controlled -Current treatment  Flovent HFA 110 mcg/act 2 puffs twice daily - Query Appropriate, Query effective, Safe, Accessible -Medications previously tried: n/a  -Gold Grade: n/a -Current COPD Classification:  A (low sx, <2 exacerbations/yr) -MMRC/CAT score: n/a -Pulmonary function testing: could not locate -Exacerbations requiring treatment in last 6 months: none -Patient reports consistent use of maintenance inhaler -Frequency of rescue inhaler use: does not have one -Counseled on Proper inhaler technique; -Recommended PFTs given query diagnosis for COPD/asthma.  Osteoporosis / Osteopenia (Goal prevent fractures) -Uncontrolled -Last DEXA Scan: 2018   T-Score femoral neck: -1.8  T-Score total hip: n/a  T-Score lumbar spine: -0.5  T-Score forearm radius: n/a  10-year probability of major osteoporotic fracture: 14.1%  10-year probability of hip fracture: 4.1% -Patient is not a candidate for pharmacologic treatment -Current treatment  No medications -Medications previously tried: none  -Recommend [phone] units of vitamin D daily. Recommend 1200 mg of calcium daily from dietary and supplemental sources. -Collaborated with PCP about restarting calcium and vitamin D supplementation.  Ulcerative colitis (Goal: minimize symptoms) -Controlled -Current treatment  Mesalamine 1.2 g 2 tablets daily with breakfast - Appropriate, Effective, Safe, Accessible Budesonide 3 g 3 capsules daily - Appropriate, Effective, Safe, Accessible -Medications previously tried: none  -Recommended to continue current medication  Hypothyroidism (Goal: 2.5-4.5) -Uncontrolled -Current treatment  Levothyroxine 75 mcg 1 tablet daily - Appropriate, Query effective, Safe, Accessible -Medications previously tried: none  -Recommended to continue  current medication Counseled on separating from iron tablets by 4 hours.  Anemia (Goal: minimize bleeding, HgB > 11) -Uncontrolled -Current treatment  Ferrous sulfate 325 mg 1 tablet daily - Appropriate, Query effective, Safe, Accessible -Medications previously tried: none  -Recommended to continue current medication   Health Maintenance -Vaccine gaps: shingrix, Pneumovax, influenza -Current therapy:  Acetaminophen 500 mg as needed Tums as needed (couple times a week)  -Educated on Cost vs benefit of each product must be carefully weighed by individual consumer -Patient is satisfied with current therapy and denies issues -Recommended to continue current medication  Patient Goals/Self-Care Activities Patient will:  - take medications as prescribed as evidenced by patient report and record review check blood pressure weekly and when symptomatic, document, and provide at future appointments  Follow Up Plan: The care management team will reach out to the patient again over the next 30 days.        Medication Assistance: None required.  Patient affirms current coverage meets needs.  Compliance/Adherence/Medication fill history: Care Gaps: Shingrix, Pneumovax, COVID, influenza Last BP - 120/60 on 12/16/2021  Star-Rating Drugs: None  Patient's [facility] pharmacy is:  [id number]: [phone]: [phone]  Uses pill box? No - keeps them in the bottle Pt endorses 99% compliance  We discussed: Current pharmacy is preferred with insurance plan and patient is satisfied with pharmacy services Patient decided to: Continue current medication management strategy  Care Plan and Follow Up Patient Decision:  Patient agrees to Care Plan and Follow-up.  Plan: The care management team will reach out to the patient again over the next 90 days.  MJeni Salles PharmD, [place] Pharmacist [facility] [place]

## 2021-12-22 ENCOUNTER — Ambulatory Visit: Payer: Medicare Other | Admitting: Pharmacist

## 2021-12-22 DIAGNOSIS — E039 Hypothyroidism, unspecified: Secondary | ICD-10-CM

## 2021-12-22 DIAGNOSIS — J449 Chronic obstructive pulmonary disease, unspecified: Secondary | ICD-10-CM

## 2021-12-22 NOTE — Patient Instructions (Signed)
Hi Landyn,  It was great to catch up again!  Please reach out to me if you have any questions or need anything!  Best, Maddie  Jeni Salles, PharmD, [place] Pharmacist [place] at [place]

## 2022-01-01 ENCOUNTER — Other Ambulatory Visit: Payer: Self-pay | Admitting: Nurse Practitioner

## 2022-01-05 NOTE — Addendum Note (Signed)
Addended by: Farrel Conners on: 01/05/2022 12:04 PM   Modules accepted: Orders

## 2022-01-13 ENCOUNTER — Other Ambulatory Visit: Payer: Self-pay | Admitting: *Deleted

## 2022-01-13 MED ORDER — LEVOTHYROXINE SODIUM 75 MCG PO TABS: 75.0000 ug | ORAL_TABLET | Freq: Every day | ORAL | 1 refills | Status: DC

## 2022-01-19 ENCOUNTER — Inpatient Hospital Stay (HOSPITAL_BASED_OUTPATIENT_CLINIC_OR_DEPARTMENT_OTHER): Payer: Medicare Other | Admitting: Oncology

## 2022-01-19 ENCOUNTER — Other Ambulatory Visit: Payer: Self-pay

## 2022-01-19 ENCOUNTER — Inpatient Hospital Stay: Payer: Medicare Other | Attending: Oncology

## 2022-01-19 ENCOUNTER — Other Ambulatory Visit: Payer: Self-pay | Admitting: Oncology

## 2022-01-19 VITALS — BP 144/65 | HR 93 | Temp 97.5°F | Resp 16 | Ht 68.0 in | Wt 151.8 lb

## 2022-01-19 DIAGNOSIS — D509 Iron deficiency anemia, unspecified: Secondary | ICD-10-CM | POA: Insufficient documentation

## 2022-01-19 DIAGNOSIS — G8929 Other chronic pain: Secondary | ICD-10-CM | POA: Insufficient documentation

## 2022-01-19 DIAGNOSIS — R6884 Jaw pain: Secondary | ICD-10-CM | POA: Diagnosis not present

## 2022-01-19 DIAGNOSIS — Z79899 Other long term (current) drug therapy: Secondary | ICD-10-CM | POA: Insufficient documentation

## 2022-01-19 DIAGNOSIS — D649 Anemia, unspecified: Secondary | ICD-10-CM

## 2022-01-19 DIAGNOSIS — G629 Polyneuropathy, unspecified: Secondary | ICD-10-CM | POA: Insufficient documentation

## 2022-01-19 LAB — CBC WITH DIFFERENTIAL (CANCER CENTER ONLY)
Abs Immature Granulocytes: 0.03 10*3/uL (ref 0.00–0.07)
Basophils Absolute: 0.1 10*3/uL (ref 0.0–0.1)
Basophils Relative: 1 %
Eosinophils Absolute: 0.2 10*3/uL (ref 0.0–0.5)
Eosinophils Relative: 2 %
HCT: 35.7 % — ABNORMAL LOW (ref 36.0–46.0)
Hemoglobin: 11.8 g/dL — ABNORMAL LOW (ref 12.0–15.0)
Immature Granulocytes: 0 %
Lymphocytes Relative: 25 %
Lymphs Abs: 2 10*3/uL (ref 0.7–4.0)
MCH: 31.7 pg (ref 26.0–34.0)
MCHC: 33.1 g/dL (ref 30.0–36.0)
MCV: 96 fL (ref 80.0–100.0)
Monocytes Absolute: 1 10*3/uL (ref 0.1–1.0)
Monocytes Relative: 12 %
Neutro Abs: 4.9 10*3/uL (ref 1.7–7.7)
Neutrophils Relative %: 60 %
Platelet Count: 354 10*3/uL (ref 150–400)
RBC: 3.72 MIL/uL — ABNORMAL LOW (ref 3.87–5.11)
RDW: 16.3 % — ABNORMAL HIGH (ref 11.5–15.5)
WBC Count: 8.2 10*3/uL (ref 4.0–10.5)
nRBC: 0 % (ref 0.0–0.2)

## 2022-01-19 LAB — IRON AND IRON BINDING CAPACITY (CC-WL,HP ONLY)
Iron: 53 ug/dL (ref 28–170)
Saturation Ratios: 16 % (ref 10.4–31.8)
TIBC: 335 ug/dL (ref 250–450)
UIBC: 282 ug/dL (ref 148–442)

## 2022-01-19 LAB — FERRITIN: Ferritin: 34 ng/mL (ref 11–307)

## 2022-01-19 LAB — SAMPLE TO BLOOD BANK

## 2022-01-19 LAB — VITAMIN B12: Vitamin B-12: 301 pg/mL (ref 180–914)

## 2022-01-19 NOTE — Progress Notes (Signed)
Hematology and Oncology Follow Up Visit  [name] [id number] Nov 06, 1934 87 y.o. 01/19/2022 10:15 AM Default, Provider, MDNo ref. provider found   Principle Diagnosis: 87 year old woman with iron deficiency anemia diagnosed in September 2022.  This appears to be related to chronic GI losses from colitis.   Prior Therapy:  She is status post packed red cell transfusion and oral iron replacement.  Current therapy: Oral iron replacement.  Interim History: [name] returns today for a follow-up.  Since the last visit, she reports that no major changes in her health.  She continues to be on oral iron supplement daily without any issues.  She does not report any hematochezia or melena.  She denies any hemoptysis or hematemesis.  She continues to have chronic pain issues predominantly in her lower extremity related to neuropathy and occasional jaw pain.  She does ambulate with the help of cane without any falls or syncope.    Medications: Updated on review. Current Outpatient Medications  Medication Sig Dispense Refill   acetaminophen (TYLENOL) 500 MG tablet Take 500 mg by mouth every 6 (six) hours as needed for moderate pain.     budesonide (ENTOCORT EC) 3 MG 24 hr capsule Take 3 capsules (9 mg total) by mouth daily. 90 capsule 3   Ferrous Sulfate (IRON) 325 (65 FE) MG TABS Take 1 tablet by mouth daily.      fluticasone (FLOVENT HFA) 220 MCG/ACT inhaler Inhale 1 puff into the lungs in the morning and at bedtime. 1 each 5   levothyroxine (SYNTHROID) 75 MCG tablet Take 1 tablet (75 mcg total) by mouth daily. 90 tablet 1   mesalamine (LIALDA) 1.2 g EC tablet Take 2 tablets (2.4 g total) by mouth daily with breakfast. 180 tablet 1   mesalamine (ROWASA) 4 g enema Place 60 mLs (4 g total) rectally at bedtime. 2100 mL 0   Spacer/Aero-Holding Chambers (E-Z SPACER) inhaler Use as instructed 1 each 2   No current facility-administered medications for this visit.     Allergies:  Allergies   Allergen Reactions   [place] Nut Daisey Must) Skin Test Anaphylaxis   Fluconazole Shortness Of Breath   Metronidazole Shortness Of Breath and Nausea And Vomiting   Pepcid [Famotidine] Shortness Of Breath and Other (See Comments)    Dizziness   Shellfish Allergy Anaphylaxis   Macrobid [Nitrofurantoin]     REACTION: Syncope   Pantoprazole Other (See Comments)    CHEST PAIN    Physical Exam:   ECOG: 2   General appearance: Comfortable appearing without any discomfort Head: Normocephalic without any trauma Oropharynx: Mucous membranes are moist and pink without any thrush or ulcers. Eyes: Pupils are equal and round reactive to light. Lymph nodes: No cervical, supraclavicular, inguinal or axillary lymphadenopathy.   Heart:regular rate and rhythm.  S1 and S2 without leg edema. Lung: Clear without any rhonchi or wheezes.  No dullness to percussion. Abdomin: Soft, nontender, nondistended with good bowel sounds.  No hepatosplenomegaly. Musculoskeletal: No joint deformity or effusion.  Full range of motion noted. Neurological: No deficits noted on motor, sensory and deep tendon reflex exam. Skin: No petechial rash or dryness.  Appeared moist.       Lab Results: Lab Results  Component Value Date   WBC 6.4 11/08/2021   HGB 8.4 (L) 11/08/2021   HCT 26.6 (L) 11/08/2021   MCV 94.0 11/08/2021   PLT 431 (H) 11/08/2021     Chemistry      Component Value Date/Time   NA  135 11/08/2021 1145   K 4.2 11/08/2021 1145   CL 100 11/08/2021 1145   CO2 26 11/08/2021 1145   BUN 14 11/08/2021 1145   CREATININE 0.63 11/08/2021 1145   CREATININE 0.64 08/14/2020 1618      Component Value Date/Time   CALCIUM 9.7 11/08/2021 1145   CALCIUM 10.6 (H) 06/06/2009 0142   ALKPHOS 38 11/08/2021 1145   AST 18 11/08/2021 1145   ALT 10 11/08/2021 1145   BILITOT 0.2 (L) 11/08/2021 1145        Impression and Plan:   87 year old woman with:  1.  Iron deficiency anemia related to poor  iron absorption and colitis diagnosed in September 2022.    The natural course of her disease and treatment choices were discussed at this time.  Other contributing factors including anemia of chronic disease due to chronic inflammatory processes could also be considered.  Intravenous iron infusion option was also discussed at this time.  Complications that include arthralgias, myalgias and infusion related complications were discussed.   CBC reviewed today and showed a hemoglobin of 11.8 which is close to normal range.  At this time I have recommended no further intervention and continued monitoring.  I will update iron studies today and repeated in 6 months.      2.  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  It is unclear to me if this is related to porphyria versus neuropathy.  He does take Tylenol which helped her symptoms.   3.  Follow-up: In 6 months for repeat evaluation.     30  minutes were spent on this encounter.  The time was spent on updating disease status, treatment choices and outlining future plan of care review.     Zola Button, MD 12/13/202310:15 AM

## 2022-01-24 ENCOUNTER — Ambulatory Visit (INDEPENDENT_AMBULATORY_CARE_PROVIDER_SITE_OTHER): Payer: Medicare Other

## 2022-01-24 VITALS — Ht 68.0 in | Wt 151.0 lb

## 2022-01-24 DIAGNOSIS — Z0000 Encounter for general adult medical examination without abnormal findings: Secondary | ICD-10-CM

## 2022-01-24 NOTE — Patient Instructions (Addendum)
[name] , Thank you for taking time to come for your Medicare Wellness Visit. I appreciate your ongoing commitment to your health goals. Please review the following plan we discussed and let me know if I can assist you in the future.   These are the goals we discussed:  Goals       Exercise 3x per week (30 min per time)      Maintain current lifestyle/ Walking daily.      Stay Healthy (pt-stated)        This is a list of the screening recommended for you and due dates:  Health Maintenance  Topic Date Due   COVID-19 Vaccine (1) 02/09/2022*   Zoster (Shingles) Vaccine (1 of 2) 02/11/2022*   Flu Shot  05/09/2022*   Pneumonia Vaccine (2 - PCV) 01/25/2023*   Medicare Annual Wellness Visit  01/25/2023   DTaP/Tdap/Td vaccine (3 - Td or Tdap) 08/16/2026   DEXA scan (bone density measurement)  Completed   HPV Vaccine  Aged Out  *Topic was postponed. The date shown is not the original due date.    Advanced directives: in Chart  Conditions/risks identified: None  Next appointment: Follow up in one year for your annual wellness visit     Preventive Care 65 Years and Older, Female Preventive care refers to lifestyle choices and visits with your health care provider that can promote health and wellness. What does preventive care include? A yearly physical exam. This is also called an annual well check. Dental exams once or twice a year. Routine eye exams. Ask your health care provider how often you should have your eyes checked. Personal lifestyle choices, including: Daily care of your teeth and gums. Regular physical activity. Eating a healthy diet. Avoiding tobacco and drug use. Limiting alcohol use. Practicing safe sex. Taking low-dose aspirin every day. Taking vitamin and mineral supplements as recommended by your health care provider. What happens during an annual well check? The services and screenings done by your health care provider during your annual well check will  depend on your age, overall health, lifestyle risk factors, and family history of disease. Counseling  Your health care provider may ask you questions about your: Alcohol use. Tobacco use. Drug use. Emotional well-being. Home and relationship well-being. Sexual activity. Eating habits. History of falls. Memory and ability to understand (cognition). Work and work Statistician. Reproductive health. Screening  You may have the following tests or measurements: Height, weight, and BMI. Blood pressure. Lipid and cholesterol levels. These may be checked every 5 years, or more frequently if you are over 56 years old. Skin check. Lung cancer screening. You may have this screening every year starting at age 10 if you have a 30-pack-year history of smoking and currently smoke or have quit within the past 15 years. Fecal occult blood test (FOBT) of the stool. You may have this test every year starting at age 27. Flexible sigmoidoscopy or colonoscopy. You may have a sigmoidoscopy every 5 years or a colonoscopy every 10 years starting at age 4. Hepatitis C blood test. Hepatitis B blood test. Sexually transmitted disease (STD) testing. Diabetes screening. This is done by checking your blood sugar (glucose) after you have not eaten for a while (fasting). You may have this done every 1-3 years. Bone density scan. This is done to screen for osteoporosis. You may have this done starting at age 26. Mammogram. This may be done every 1-2 years. Talk to your health care provider about how often you  should have regular mammograms. Talk with your health care provider about your test results, treatment options, and if necessary, the need for more tests. Vaccines  Your health care provider may recommend certain vaccines, such as: Influenza vaccine. This is recommended every year. Tetanus, diphtheria, and acellular pertussis (Tdap, Td) vaccine. You may need a Td booster every 10 years. Zoster vaccine. You may  need this after age 28. Pneumococcal 13-valent conjugate (PCV13) vaccine. One dose is recommended after age 82. Pneumococcal polysaccharide (PPSV23) vaccine. One dose is recommended after age 69. Talk to your health care provider about which screenings and vaccines you need and how often you need them. This information is not intended to replace advice given to you by your health care provider. Make sure you discuss any questions you have with your health care provider. Document Released: 02/20/2015 Document Revised: 10/14/2015 Document Reviewed: 11/25/2014 Elsevier Interactive Patient Education  2017 [place] Prevention in the Home Falls can cause injuries. They can happen to people of all ages. There are many things you can do to make your home safe and to help prevent falls. What can I do on the outside of my home? Regularly fix the edges of walkways and driveways and fix any cracks. Remove anything that might make you trip as you walk through a door, such as a raised step or threshold. Trim any bushes or trees on the path to your home. Use bright outdoor lighting. Clear any walking paths of anything that might make someone trip, such as rocks or tools. Regularly check to see if handrails are loose or broken. Make sure that both sides of any steps have handrails. Any raised decks and porches should have guardrails on the edges. Have any leaves, snow, or ice cleared regularly. Use sand or salt on walking paths during winter. Clean up any spills in your garage right away. This includes oil or grease spills. What can I do in the bathroom? Use night lights. Install grab bars by the toilet and in the tub and shower. Do not use towel bars as grab bars. Use non-skid mats or decals in the tub or shower. If you need to sit down in the shower, use a plastic, non-slip stool. Keep the floor dry. Clean up any water that spills on the floor as soon as it happens. Remove soap buildup in  the tub or shower regularly. Attach bath mats securely with double-sided non-slip rug tape. Do not have throw rugs and other things on the floor that can make you trip. What can I do in the bedroom? Use night lights. Make sure that you have a light by your bed that is easy to reach. Do not use any sheets or blankets that are too big for your bed. They should not hang down onto the floor. Have a firm chair that has side arms. You can use this for support while you get dressed. Do not have throw rugs and other things on the floor that can make you trip. What can I do in the kitchen? Clean up any spills right away. Avoid walking on wet floors. Keep items that you use a lot in easy-to-reach places. If you need to reach something above you, use a strong step stool that has a grab bar. Keep electrical cords out of the way. Do not use floor polish or wax that makes floors slippery. If you must use wax, use non-skid floor wax. Do not have throw rugs and other things on the  floor that can make you trip. What can I do with my stairs? Do not leave any items on the stairs. Make sure that there are handrails on both sides of the stairs and use them. Fix handrails that are broken or loose. Make sure that handrails are as long as the stairways. Check any carpeting to make sure that it is firmly attached to the stairs. Fix any carpet that is loose or worn. Avoid having throw rugs at the top or bottom of the stairs. If you do have throw rugs, attach them to the floor with carpet tape. Make sure that you have a light switch at the top of the stairs and the bottom of the stairs. If you do not have them, ask someone to add them for you. What else can I do to help prevent falls? Wear shoes that: Do not have high heels. Have rubber bottoms. Are comfortable and fit you well. Are closed at the toe. Do not wear sandals. If you use a stepladder: Make sure that it is fully opened. Do not climb a closed  stepladder. Make sure that both sides of the stepladder are locked into place. Ask someone to hold it for you, if possible. Clearly mark and make sure that you can see: Any grab bars or handrails. First and last steps. Where the edge of each step is. Use tools that help you move around (mobility aids) if they are needed. These include: Canes. Walkers. Scooters. Crutches. Turn on the lights when you go into a dark area. Replace any light bulbs as soon as they burn out. Set up your furniture so you have a clear path. Avoid moving your furniture around. If any of your floors are uneven, fix them. If there are any pets around you, be aware of where they are. Review your medicines with your doctor. Some medicines can make you feel dizzy. This can increase your chance of falling. Ask your doctor what other things that you can do to help prevent falls. This information is not intended to replace advice given to you by your health care provider. Make sure you discuss any questions you have with your health care provider. Document Released: 11/20/2008 Document Revised: 07/02/2015 Document Reviewed: 02/28/2014 Elsevier Interactive Patient Education  2017 [employer].

## 2022-01-24 NOTE — Progress Notes (Signed)
Subjective:   [name] is a 87 y.o. female who presents for Medicare Annual (Subsequent) preventive examination.  Review of Systems    Virtual Visit via Telephone Note  I connected with  [name] on 01/24/22 at 10:45 AM EST by telephone and verified that I am speaking with the correct person using two identifiers.  Location: Patient: Home Provider: Office Persons participating in the virtual visit: patient/Nurse Health Advisor   I discussed the limitations, risks, security and privacy concerns of performing an evaluation and management service by telephone and the availability of in person appointments. The patient expressed understanding and agreed to proceed.  Interactive audio and video telecommunications were attempted between this nurse and patient, however failed, due to patient having technical difficulties OR patient did not have access to video capability.  We continued and completed visit with audio only.  Some vital signs may be absent or patient reported.   Criselda Peaches, LPN  Cardiac Risk Factors include: advanced age (>37mn, >>16women);hypertension     Objective:    Today's Vitals   01/24/22 1044  Weight: 151 lb (68.5 kg)  Height: 5' 8"  (1.727 m)   Body mass index is 22.96 kg/m.     01/24/2022   10:51 AM 11/08/2021   11:17 AM 10/20/2021    1:00 AM 10/19/2021   10:16 PM 10/19/2021   11:58 AM 01/22/2021   11:28 AM 11/02/2020    4:01 PM  Advanced Directives  Does Patient Have a Medical Advance Directive? Yes Yes Yes Yes Yes Yes Yes  Type of AParamedicof AGibbonLiving will Living will;Healthcare Power of ACrimoraLiving will HIndependenceLiving will;Out of facility DNR (pink MOST or yellow form) HWhite Island ShoresLiving will HBig BeaverLiving will  Does patient want to make changes to medical advance directive? No - Patient declined  Yes (Inpatient -  patient defers changing a medical advance directive at this time - Information given)   No - Patient declined   Copy of HEast Verde Estatesin Chart? Yes - validated most recent copy scanned in chart (See row information)  Yes - validated most recent copy scanned in chart (See row information) Yes - validated most recent copy scanned in chart (See row information)  No - copy requested No - copy requested    Current Medications (verified) Outpatient Encounter Medications as of 01/24/2022  Medication Sig   acetaminophen (TYLENOL) 500 MG tablet Take 500 mg by mouth every 6 (six) hours as needed for moderate pain.   budesonide (ENTOCORT EC) 3 MG 24 hr capsule Take 3 capsules (9 mg total) by mouth daily.   Ferrous Sulfate (IRON) 325 (65 FE) MG TABS Take 1 tablet by mouth daily.    fluticasone (FLOVENT HFA) 220 MCG/ACT inhaler Inhale 1 puff into the lungs in the morning and at bedtime.   levothyroxine (SYNTHROID) 75 MCG tablet Take 1 tablet (75 mcg total) by mouth daily.   mesalamine (LIALDA) 1.2 g EC tablet Take 2 tablets (2.4 g total) by mouth daily with breakfast.   mesalamine (ROWASA) 4 g enema Place 60 mLs (4 g total) rectally at bedtime.   Spacer/Aero-Holding Chambers (E-Z SPACER) inhaler Use as instructed   No facility-administered encounter medications on file as of 01/24/2022.    Allergies (verified) BBolivianut (berthollefia excelsa) skin test, Fluconazole, Metronidazole, Pepcid [famotidine], Shellfish allergy, Macrobid [nitrofurantoin], and Pantoprazole   History: Past Medical History:  Diagnosis Date   ABDOMINAL PAIN, CHRONIC 05/28/2008   ALLERGIC RHINITIS 09/21/2006   ANEMIA-NOS 09/21/2006   ASTHMA 09/21/2006   ASYMPTOMATIC POSTMENOPAUSAL STATUS 05/28/2008   Cataract    CHEST PAIN, ATYPICAL 05/28/2008   COPD (chronic obstructive pulmonary disease) ([place])    COVID-19 virus infection 02/2020   DDD (degenerative disc disease)    DIVERTICULITIS, HX OF 09/21/2006   GERD  09/21/2006   HYPERCHOLESTEROLEMIA 06/05/2009   HYPERTENSION 09/21/2006   HYPOTHYROIDISM 09/21/2006   Peptic ulcer disease    Purpura ([place])    RENAL INSUFFICIENCY 09/21/2006   Shingles    Small bowel obstruction (HCC)    Somatization disorder 04/25/2007   UNSPECIFIED URINARY CALCULUS 05/28/2008   UTI (urinary tract infection) 07/2019   UTI (urinary tract infection) 11/08/2021   Past Surgical History:  Procedure Laterality Date   ABDOMINAL HYSTERECTOMY  1981   acute Nephritis  [place]   BIOPSY  11/18/2019   Procedure: BIOPSY;  Surgeon: Ladene Artist, MD;  Location: [facility];  Service: Endoscopy;;   BIOPSY  10/21/2021   Procedure: BIOPSY;  Surgeon: Irving Copas., MD;  Location: [facility];  Service: Gastroenterology;;   [place] N/A 11/18/2019   Procedure: FLEXIBLE SIGMOIDOSCOPY;  Surgeon: Ladene Artist, MD;  Location: [facility];  Service: Endoscopy;  Laterality: N/A;   FLEXIBLE SIGMOIDOSCOPY N/A 10/21/2021   Procedure: FLEXIBLE SIGMOIDOSCOPY;  Surgeon: Rush Landmark Telford Nab., MD;  Location: [facility];  Service: Gastroenterology;  Laterality: N/A;   NASAL SINUS SURGERY  1967   Pulmonary thrombosis     TONSILLECTOMY     TUBAL LIGATION     Family History  Problem Relation Age of Onset   Polycystic kidney disease Mother    Pancreatic cancer Mother    Other Father        Schamberg disease   Bladder Cancer Father    Hypertension Father    Marfan syndrome Son    Hemochromatosis Son    Cirrhosis Son    Allergic rhinitis Sister    Other Brother        bone issue as child; multiple fractures but seemed to age out of this   Hemochromatosis Cousin    Arthritis Sister    Colon cancer Neg Hx    Esophageal cancer Neg Hx    Stomach cancer Neg Hx    Social History   Socioeconomic History   Marital status: Divorced    Spouse name: Not  on file   Number of children: 3   Years of education: Not on file   Highest education level: Not on file  Occupational History   Occupation: retired  Tobacco Use   Smoking status: Never   Smokeless tobacco: Never  Vaping Use   Vaping Use: Never used  Substance and Sexual Activity   Alcohol use: No   Drug use: No   Sexual activity: Not Currently  Other Topics Concern   Not on file  Social History Narrative   Not on file   Social Determinants of Health   Financial Resource Strain: Low Risk  (01/24/2022)   Overall Financial Resource Strain (CARDIA)    Difficulty of Paying Living Expenses: Not hard at all  Food Insecurity: No Food Insecurity (01/24/2022)   Hunger Vital Sign    Worried About Running Out of Food in the Last Year:  Never true    Ran Out of Food in the Last Year: Never true  Transportation Needs: No Transportation Needs (01/24/2022)   PRAPARE - Hydrologist (Medical): No    Lack of Transportation (Non-Medical): No  Physical Activity: Inactive (01/24/2022)   Exercise Vital Sign    Days of Exercise per Week: 0 days    Minutes of Exercise per Session: 0 min  Stress: No Stress Concern Present (01/24/2022)   [place]    Feeling of Stress : Not at all  Social Connections: Moderately Integrated (01/24/2022)   Social Connection and Isolation Panel [NHANES]    Frequency of Communication with Friends and Family: More than three times a week    Frequency of Social Gatherings with Friends and Family: More than three times a week    Attends Religious Services: More than 4 times per year    Active Member of [employer] or Organizations: Yes    Attends Music therapist: More than 4 times per year    Marital Status: Divorced    Tobacco Counseling Counseling given: Not Answered   Clinical Intake:  Pre-visit preparation completed: No  Pain : No/denies pain     BMI  - recorded: 22.96 Nutritional Status: BMI of 19-24  Normal Nutritional Risks: None Diabetes: No  How often do you need to have someone help you when you read instructions, pamphlets, or other written materials from your doctor or pharmacy?: 1 - Never  Diabetic?  No  Interpreter Needed?: No  Information entered by :: Rolene Arbour LPN   Activities of Daily Living    01/24/2022   10:50 AM 10/19/2021   10:23 PM  In your present state of health, do you have any difficulty performing the following activities:  Hearing? 0 0  Vision? 0 0  Difficulty concentrating or making decisions? 0 0  Walking or climbing stairs? 0 0  Dressing or bathing? 0 0  Doing errands, shopping? 0 1  Preparing Food and eating ? N   Using the Toilet? N   In the past six months, have you accidently leaked urine? N   Do you have problems with loss of bowel control? N   Managing your Medications? N   Managing your Finances? N   Housekeeping or managing your Housekeeping? N     Patient Care Team: Default, Provider, MD as PCP - General Jari Pigg, MD as Attending Physician (Dermatology) Clent Jacks, MD (Ophthalmology) Viona Gilmore, [facility] as Pharmacist (Pharmacist)  Indicate any recent Medical Services you may have received from other than [facility] providers in the past year (date may be approximate).     Assessment:   This is a routine wellness examination for [name].  Hearing/Vision screen Hearing Screening - Comments:: Denies hearing difficulties   Vision Screening - Comments:: Wears rx glasses - up to date with routine eye exams with  Dr Katy Fitch  Dietary issues and exercise activities discussed: Exercise limited by: None identified   Goals Addressed               This Visit's Progress     Stay Healthy (pt-stated)         Depression Screen    01/24/2022   10:49 AM 11/01/2021   11:40 AM 01/22/2021   11:20 AM 01/22/2020   11:32 AM 01/19/2017    4:51 PM 09/14/2015    1:56 PM 01/12/2015    11:08 AM  PHQ  2/9 Scores  PHQ - 2 Score 0 2 0 0 0 0 0  PHQ- 9 Score  8  0       Fall Risk    01/24/2022   10:50 AM 01/22/2021   11:24 AM 08/14/2020    3:31 PM 01/22/2020   11:31 AM 04/30/2019    2:24 PM  Fall Risk   Falls in the past year? 0 0 0 1 1  Comment     01/27/2019  Number falls in past yr: 0 0  1 0  Injury with Fall? 0 0  1 1  Risk for fall due to : No Fall Risks   History of fall(s);Impaired balance/gait;Impaired mobility   Follow up Falls prevention discussed   Falls evaluation completed;Falls prevention discussed     FALL RISK PREVENTION PERTAINING TO THE HOME:  Any stairs in or around the home? No  If so, are there any without handrails? No  Home free of loose throw rugs in walkways, pet beds, electrical cords, etc? Yes  Adequate lighting in your home to reduce risk of falls? Yes   ASSISTIVE DEVICES UTILIZED TO PREVENT FALLS:  Life alert? Yes  Use of a cane, walker or w/c? Yes  Grab bars in the bathroom? Yes  Shower chair or bench in shower? Yes  Elevated toilet seat or a handicapped toilet? Yes   TIMED UP AND GO:  Was the test performed? No . Audio Visit  Cognitive Function:        01/24/2022   10:51 AM 01/22/2021   11:29 AM  6CIT Screen  What Year? 0 points 0 points  What month? 0 points 0 points  What time? 0 points 0 points  Count back from 20 0 points 0 points  Months in reverse 0 points 0 points  Repeat phrase 0 points 0 points  Total Score 0 points 0 points    Immunizations Immunization History  Administered Date(s) Administered   Fluad Quad(high Dose 65+) 11/23/2018   Influenza Whole 12/08/2008   Influenza, High Dose Seasonal PF 10/22/2013, 11/12/2016   Influenza,inj,Quad PF,6+ Mos 11/08/2017   Influenza-Unspecified 11/23/2018   Pneumococcal Polysaccharide-23 05/09/2002, 07/18/2014   Td 05/09/2003   Tdap 08/15/2016   Zoster, Live 06/21/2013    TDAP status: Up to date    Pneumococcal vaccine status: Declined,  Education  has been provided regarding the importance of this vaccine but patient still declined. Advised may receive this vaccine at local pharmacy or Health Dept. Aware to provide a copy of the vaccination record if obtained from local pharmacy or Health Dept. Verbalized acceptance and understanding.   Covid-19 vaccine status: Declined, Education has been provided regarding the importance of this vaccine but patient still declined. Advised may receive this vaccine at local pharmacy or Health Dept.or vaccine clinic. Aware to provide a copy of the vaccination record if obtained from local pharmacy or Health Dept. Verbalized acceptance and understanding.  Qualifies for Shingles Vaccine? Yes   Zostavax completed No   Shingrix Completed?: No.    Education has been provided regarding the importance of this vaccine. Patient has been advised to call insurance company to determine out of pocket expense if they have not yet received this vaccine. Advised may also receive vaccine at local pharmacy or Health Dept. Verbalized acceptance and understanding.  Screening Tests Health Maintenance  Topic Date Due   COVID-19 Vaccine (1) 02/09/2022 (Originally 04/13/1939)   Zoster Vaccines- Shingrix (1 of 2) 02/11/2022 (Originally 04/14/1953)   INFLUENZA VACCINE  05/09/2022 (Originally 09/07/2021)   Pneumonia Vaccine 48+ Years old (2 - PCV) 01/25/2023 (Originally 07/18/2015)   Medicare Annual Wellness (AWV)  01/25/2023   DTaP/Tdap/Td (3 - Td or Tdap) 08/16/2026   DEXA SCAN  Completed   HPV VACCINES  Aged Out    Health Maintenance  There are no preventive care reminders to display for this patient.   Colorectal cancer screening: No longer required.   Mammogram status: No longer required due to Age.  Bone Density status: Completed 08/25/16. Results reflect: Bone density results: OSTEOPOROSIS. Repeat every   years.  Lung Cancer Screening: (Low Dose CT Chest recommended if Age 21-80 years, 30 pack-year currently smoking OR  have quit w/in 15years.) does not qualify.    Additional Screening:  Hepatitis C Screening: does not qualify; Completed   Vision Screening: Recommended annual ophthalmology exams for early detection of glaucoma and other disorders of the eye. Is the patient up to date with their annual eye exam?  Yes  Who is the provider or what is the name of the office in which the patient attends annual eye exams? Dr Katy Fitch If pt is not established with a provider, would they like to be referred to a provider to establish care? No .   Dental Screening: Recommended annual dental exams for proper oral hygiene  Community Resource Referral / Chronic Care Management:  CRR required this visit?  No   CCM required this visit?  No      Plan:     I have personally reviewed and noted the following in the patient's chart:   Medical and social history Use of alcohol, tobacco or illicit drugs  Current medications and supplements including opioid prescriptions. Patient is not currently taking opioid prescriptions. Functional ability and status Nutritional status Physical activity Advanced directives List of other physicians Hospitalizations, surgeries, and ER visits in previous 12 months Vitals Screenings to include cognitive, depression, and falls Referrals and appointments  In addition, I have reviewed and discussed with patient certain preventive protocols, quality metrics, and best practice recommendations. A written personalized care plan for preventive services as well as general preventive health recommendations were provided to patient.     Criselda Peaches, LPN   15/06/6977   Nurse Notes: None

## 2022-02-10 ENCOUNTER — Encounter: Payer: Self-pay | Admitting: Family Medicine

## 2022-02-10 ENCOUNTER — Ambulatory Visit (INDEPENDENT_AMBULATORY_CARE_PROVIDER_SITE_OTHER): Payer: Medicare Other | Admitting: Family Medicine

## 2022-02-10 VITALS — BP 130/60 | HR 95 | Temp 97.9°F | Ht 68.0 in

## 2022-02-10 DIAGNOSIS — M6281 Muscle weakness (generalized): Secondary | ICD-10-CM | POA: Diagnosis not present

## 2022-02-10 DIAGNOSIS — M810 Age-related osteoporosis without current pathological fracture: Secondary | ICD-10-CM | POA: Diagnosis not present

## 2022-02-10 DIAGNOSIS — R202 Paresthesia of skin: Secondary | ICD-10-CM | POA: Diagnosis not present

## 2022-02-10 NOTE — Progress Notes (Signed)
Established Patient Office Visit  Subjective   Patient ID: [name], female    DOB: [birth year]  Age: 88 y.o. MRN: [id number]  Chief Complaint  Patient presents with   Leg Pain    Patient complains of bilateral foot and leg pain for years, son states the patient did not take pain medication due to side effects and fear of falls due to sedation, requests evaluation for neuropathy    Pt is reporting a long history of chronic leg and foot pain. States that in the last 2-3 days she his having more trouble with her right hip. Son is present in the visit, states that she struggles with walking, describes the pain as a burning and stinging sensation. States that she keeps her legs elevated most of the day. Patient recently had her B12 checked and it was WNL.  Son reports that she is not moving around as much due to the pain and this is making her more weak/ more painful.   Patient states that she has 2 spots on the outside of her knees and then the pain is mostly in her feet, states that when she puts pressure on the bottoms of her feet she feels a sharp stabbing pain that radiates up her legs.    Current Outpatient Medications  Medication Instructions   acetaminophen (TYLENOL) 500 mg, Oral, Every 6 hours PRN   budesonide (ENTOCORT EC) 9 mg, Oral, Daily   Ferrous Sulfate (IRON) 325 (65 FE) MG TABS 1 tablet, Oral, Daily   fluticasone (FLOVENT HFA) 220 MCG/ACT inhaler 1 puff, Inhalation, 2 times daily   levothyroxine (SYNTHROID) 75 mcg, Oral, Daily   mesalamine (ROWASA) 4 g, Rectal, Daily at bedtime   Spacer/Aero-Holding Chambers (E-Z SPACER) inhaler Use as instructed    Patient Active Problem List   Diagnosis Date Noted   Symptomatic anemia 10/13/2020   Chronic ulcerative rectosigmoiditis with rectal bleeding (HCC)    Diverticulitis 10/31/2019   COPD (chronic obstructive pulmonary disease) (HCC)    Ulcerative colitis ([place])    Vitamin D deficiency 05/01/2019   Arthralgia 08/21/2017    Osteoporosis 11/21/2016   Postmenopausal status 09/06/2016   Other nonspecific abnormal finding of lung field 08/14/2015   History of adrenal disorder 10/15/2012   Iron deficiency anemia 06/20/2012   Psychogenic polydipsia 06/20/2012   Nonspecific abnormal electrocardiogram (ECG) (EKG) 06/20/2012   Pelvic pain in female 08/16/2011   Leg ulcer ([place]) 06/16/2010   Encounter for long-term (current) use of other medications 06/11/2010   HYPERCHOLESTEROLEMIA 06/05/2009   UNSPECIFIED URINARY CALCULUS 05/28/2008   SOMATIZATION DISORDER 04/25/2007   Hypothyroidism 09/21/2006   Essential hypertension 09/21/2006   ALLERGIC RHINITIS 09/21/2006   ASTHMA 09/21/2006   GERD 09/21/2006       Review of Systems  All other systems reviewed and are negative.     Objective:     BP 130/60 (BP Location: Left Arm, Patient Position: Sitting, Cuff Size: Normal)   Pulse 95   Temp 97.9 F (36.6 C) (Oral)   Ht '5\' 8"'$  (1.727 m)   SpO2 96%   BMI 22.96 kg/m    Physical Exam Vitals reviewed.  Constitutional:      Appearance: Normal appearance. She is well-groomed and normal weight.  HENT:     Head: Normocephalic and atraumatic.  Eyes:     Conjunctiva/sclera: Conjunctivae normal.  Cardiovascular:     Rate and Rhythm: Normal rate and regular rhythm.     Heart sounds: S1 normal and S2 normal.  Pulmonary:     Effort: Pulmonary effort is normal.     Breath sounds: Normal breath sounds and air entry.  Musculoskeletal:     Right lower leg: No edema.     Left lower leg: Edema (mild trace edmea in the feet and ankles) present.  Neurological:     Mental Status: She is alert and oriented to person, place, and time. Mental status is at baseline.     Gait: Gait abnormal (in wheelchair).  Psychiatric:        Mood and Affect: Mood and affect normal.        Speech: Speech normal.        Behavior: Behavior normal.        Judgment: Judgment normal.      No results found for any visits on  02/10/22.    The ASCVD Risk score (Arnett DK, et al., 2019) failed to calculate for the following reasons:   The 2019 ASCVD risk score is only valid for ages 31 to 15    Assessment & Plan:   Problem List Items Addressed This Visit   None Visit Diagnoses     Paresthesia of bilateral legs    -  Primary   Relevant Orders   The pain she is reporting is most characteristic of neuropathic pain, however she also has some elements of arthritis pain as well. I recommend referral to neurology for an EMG study first and if this is unrevealing then I will send to ortho for possible need for steroid injections to alleviate pain. She is taking tylenol extra strength at home every 6 hours, we discussed using tramadol as needed but she declines this medication currently. I have also ordered home health for physical therapy for lower extremity weakness and increased risk of falls.  Ambulatory referral to Neurology   Ambulatory referral to Home Health   Muscle weakness affecting movement of foot       Relevant Orders   Ambulatory referral to [facility]       No follow-ups on file.    Farrel Conners, MD

## 2022-02-16 DIAGNOSIS — K219 Gastro-esophageal reflux disease without esophagitis: Secondary | ICD-10-CM | POA: Diagnosis not present

## 2022-02-16 DIAGNOSIS — Z9181 History of falling: Secondary | ICD-10-CM | POA: Diagnosis not present

## 2022-02-16 DIAGNOSIS — Z8616 Personal history of COVID-19: Secondary | ICD-10-CM | POA: Diagnosis not present

## 2022-02-16 DIAGNOSIS — K51319 Ulcerative (chronic) rectosigmoiditis with unspecified complications: Secondary | ICD-10-CM | POA: Diagnosis not present

## 2022-02-16 DIAGNOSIS — D509 Iron deficiency anemia, unspecified: Secondary | ICD-10-CM | POA: Diagnosis not present

## 2022-02-16 DIAGNOSIS — Z602 Problems related to living alone: Secondary | ICD-10-CM | POA: Diagnosis not present

## 2022-02-16 DIAGNOSIS — M1990 Unspecified osteoarthritis, unspecified site: Secondary | ICD-10-CM | POA: Diagnosis not present

## 2022-02-16 DIAGNOSIS — Z87442 Personal history of urinary calculi: Secondary | ICD-10-CM | POA: Diagnosis not present

## 2022-02-16 DIAGNOSIS — Z87440 Personal history of urinary (tract) infections: Secondary | ICD-10-CM | POA: Diagnosis not present

## 2022-02-16 DIAGNOSIS — J309 Allergic rhinitis, unspecified: Secondary | ICD-10-CM | POA: Diagnosis not present

## 2022-02-16 DIAGNOSIS — E7800 Pure hypercholesterolemia, unspecified: Secondary | ICD-10-CM | POA: Diagnosis not present

## 2022-02-16 DIAGNOSIS — H269 Unspecified cataract: Secondary | ICD-10-CM | POA: Diagnosis not present

## 2022-02-16 DIAGNOSIS — K5790 Diverticulosis of intestine, part unspecified, without perforation or abscess without bleeding: Secondary | ICD-10-CM | POA: Diagnosis not present

## 2022-02-16 DIAGNOSIS — J4489 Other specified chronic obstructive pulmonary disease: Secondary | ICD-10-CM | POA: Diagnosis not present

## 2022-02-16 DIAGNOSIS — M79605 Pain in left leg: Secondary | ICD-10-CM | POA: Diagnosis not present

## 2022-02-16 DIAGNOSIS — R202 Paresthesia of skin: Secondary | ICD-10-CM | POA: Diagnosis not present

## 2022-02-16 DIAGNOSIS — M79604 Pain in right leg: Secondary | ICD-10-CM | POA: Diagnosis not present

## 2022-02-16 DIAGNOSIS — Z7951 Long term (current) use of inhaled steroids: Secondary | ICD-10-CM | POA: Diagnosis not present

## 2022-02-16 DIAGNOSIS — E039 Hypothyroidism, unspecified: Secondary | ICD-10-CM | POA: Diagnosis not present

## 2022-02-16 DIAGNOSIS — M810 Age-related osteoporosis without current pathological fracture: Secondary | ICD-10-CM | POA: Diagnosis not present

## 2022-02-16 DIAGNOSIS — I10 Essential (primary) hypertension: Secondary | ICD-10-CM | POA: Diagnosis not present

## 2022-02-28 ENCOUNTER — Telehealth: Payer: Self-pay | Admitting: Family Medicine

## 2022-02-28 NOTE — Telephone Encounter (Signed)
Son states mother has not been up to completing any of the appointments she was advised to go to. Would like to speak with someone about possible future short term/long term care

## 2022-03-01 ENCOUNTER — Telehealth (INDEPENDENT_AMBULATORY_CARE_PROVIDER_SITE_OTHER): Payer: 59 | Admitting: Family Medicine

## 2022-03-01 VITALS — Ht 68.0 in | Wt 151.0 lb

## 2022-03-01 DIAGNOSIS — R300 Dysuria: Secondary | ICD-10-CM

## 2022-03-01 MED ORDER — CEPHALEXIN 500 MG PO CAPS: 500.0000 mg | ORAL_CAPSULE | Freq: Three times a day (TID) | ORAL | 0 refills | Status: DC

## 2022-03-01 NOTE — Patient Instructions (Signed)
-  I sent the medication(s) we discussed to your pharmacy: Meds ordered this encounter  Medications   cephALEXin (KEFLEX) 500 MG capsule    Sig: Take 1 capsule (500 mg total) by mouth 3 (three) times daily.    Dispense:  15 capsule    Refill:  0     I hope you are feeling better soon!  Seek in person care promptly if your symptoms worsen, new concerns arise or you are not improving with treatment.  It was nice to meet you today. I help [facility] out with telemedicine visits on Tuesdays and Thursdays and am happy to help if you need a virtual follow up visit on those days. Otherwise, if you have any concerns or questions following this visit please schedule a follow up visit with your Primary Care office or seek care at a local urgent care clinic to avoid delays in care. If you are having severe or life threatening symptoms please call 911 and/or go to the nearest emergency room.

## 2022-03-01 NOTE — Telephone Encounter (Signed)
Pt's son called in checking on progress of this request. Says he can be reached at [phone]. Son states patient thinks she has a UTI and they are requesting medication for treatment.

## 2022-03-01 NOTE — Telephone Encounter (Signed)
Spoke with [name] and informed him the message was forwarded to the social worker to see if they can help in this case.  He was advised a visit is needed for evaluation if the patient thinks she has a UTI.  Margaret Loyer stated he is unable to transport the patient and a virtual visit was scheduled for today with Dr Maudie Mercury at 4:20pm which he stated he will set up via the patient's Mychart.

## 2022-03-01 NOTE — Telephone Encounter (Signed)
Would the social worker be able to help? She already has a ccm referral in there but I'm not sure if the social worker was involved. Do I need to put in a new ccm referral?

## 2022-03-01 NOTE — Progress Notes (Signed)
Virtual Visit via Video Note  I connected with [name]  on 03/01/22 at  4:20 PM EST by a video enabled telemedicine application and verified that I am speaking with the correct person using two identifiers.  Location patient: [place] Location provider:work or home office Persons participating in the virtual visit: patient, provider  I discussed the limitations and requested verbal permission for telemedicine visit. The patient expressed understanding and agreed to proceed.   HPI:  Acute telemedicine visit for several issues:  Dysuria: -feels is having a UTI -started yesterday -symptoms: burning with urination, frequency, urgency -denies fever, malaise, hematuria, abd pain, pelvic pain, flank pain, NVD or other symptoms -she is wanting to try empiric tx rather than doing an office visit as reports has hx of UTI. Reports keflex has worked well for her in the past.  Allergies  Allergen Reactions   [place] Nut (Berthollefia [place]) Skin Test Anaphylaxis   Fluconazole Shortness Of Breath   Metronidazole Shortness Of Breath and Nausea And Vomiting   Pepcid [Famotidine] Shortness Of Breath and Other (See Comments)    Dizziness   Shellfish Allergy Anaphylaxis   Macrobid [Nitrofurantoin]     REACTION: Syncope   Pantoprazole Other (See Comments)    CHEST PAIN      ROS: See pertinent positives and negatives per HPI.  Past Medical History:  Diagnosis Date   ABDOMINAL PAIN, CHRONIC 05/28/2008   ALLERGIC RHINITIS 09/21/2006   ANEMIA-NOS 09/21/2006   ASTHMA 09/21/2006   ASYMPTOMATIC POSTMENOPAUSAL STATUS 05/28/2008   Cataract    CHEST PAIN, ATYPICAL 05/28/2008   COPD (chronic obstructive pulmonary disease) ([place])    COVID-19 virus infection 02/2020   DDD (degenerative disc disease)    DIVERTICULITIS, HX OF 09/21/2006   GERD 09/21/2006   HYPERCHOLESTEROLEMIA 06/05/2009   HYPERTENSION 09/21/2006   HYPOTHYROIDISM 09/21/2006   Peptic ulcer disease    Purpura ([place])    RENAL INSUFFICIENCY  09/21/2006   Shingles    Small bowel obstruction (HCC)    Somatization disorder 04/25/2007   UNSPECIFIED URINARY CALCULUS 05/28/2008   UTI (urinary tract infection) 07/2019   UTI (urinary tract infection) 11/08/2021    Past Surgical History:  Procedure Laterality Date   ABDOMINAL HYSTERECTOMY  1981   acute Nephritis  [place]   BIOPSY  11/18/2019   Procedure: BIOPSY;  Surgeon: Ladene Artist, MD;  Location: [facility];  Service: Endoscopy;;   BIOPSY  10/21/2021   Procedure: BIOPSY;  Surgeon: Irving Copas., MD;  Location: [facility];  Service: Gastroenterology;;   [place] N/A 11/18/2019   Procedure: FLEXIBLE SIGMOIDOSCOPY;  Surgeon: Ladene Artist, MD;  Location: [facility];  Service: Endoscopy;  Laterality: N/A;   FLEXIBLE SIGMOIDOSCOPY N/A 10/21/2021   Procedure: FLEXIBLE SIGMOIDOSCOPY;  Surgeon: Rush Landmark Telford Nab., MD;  Location: [facility];  Service: Gastroenterology;  Laterality: N/A;   NASAL SINUS SURGERY  1967   Pulmonary thrombosis     TONSILLECTOMY     TUBAL LIGATION       Current Outpatient Medications:    acetaminophen (TYLENOL) 500 MG tablet, Take 500 mg by mouth every 6 (six) hours as needed for moderate pain., Disp: , Rfl:    budesonide (ENTOCORT EC) 3 MG 24 hr capsule, Take 3 capsules (9 mg total) by mouth daily., Disp: 90 capsule, Rfl: 3   cephALEXin (KEFLEX) 500 MG  capsule, Take 1 capsule (500 mg total) by mouth 3 (three) times daily., Disp: 15 capsule, Rfl: 0   Ferrous Sulfate (IRON) 325 (65 FE) MG TABS, Take 1 tablet by mouth daily. , Disp: , Rfl:    fluticasone (FLOVENT HFA) 220 MCG/ACT inhaler, Inhale 1 puff into the lungs in the morning and at bedtime., Disp: 1 each, Rfl: 5   levothyroxine (SYNTHROID) 75 MCG tablet, Take 1 tablet (75 mcg total) by mouth daily., Disp: 90 tablet, Rfl: 1   Spacer/Aero-Holding Chambers  (E-Z SPACER) inhaler, Use as instructed, Disp: 1 each, Rfl: 2   mesalamine (ROWASA) 4 g enema, Place 60 mLs (4 g total) rectally at bedtime., Disp: 2100 mL, Rfl: 0  EXAM:  VITALS per patient if applicable:  GENERAL: alert, oriented, appears well and in no acute distress  HEENT: atraumatic, conjunttiva clear, no obvious abnormalities on inspection of external nose and ears  NECK: normal movements of the head and neck  LUNGS: on inspection no signs of respiratory distress, breathing rate appears normal, no obvious gross SOB, gasping or wheezing  CV: no obvious cyanosis  MS: moves all visible extremities without noticeable abnormality  PSYCH/NEURO: pleasant and cooperative, no obvious depression or anxiety, speech and thought processing grossly intact  ASSESSMENT AND PLAN:  Discussed the following assessment and plan:  Dysuria  -we discussed possible serious and likely etiologies, options for evaluation and workup, limitations of telemedicine visit vs in person visit, treatment, treatment risks and precautions. Pt is agreeable to treatment via telemedicine at this moment. She prefers to try empiric tx with keflex '500mg'$  bid x 5 days. Agrees to seek inperson care if worsening, new symptoms arise, or if is not improving with treatment as expected per our conversation of expected course. Discussed options for follow up care. Did let this patient know that I do telemedicine on Tuesdays and Thursdays for [facility] and those are the days I am logged into the system. Advised to schedule follow up visit with PCP, [place] virtual visits or [facility] if any further questions or concerns to avoid delays in care.   I discussed the assessment and treatment plan with the patient. The patient was provided an opportunity to ask questions and all were answered. The patient agreed with the plan and demonstrated an understanding of the instructions.     Lucretia Kern, DO

## 2022-03-03 ENCOUNTER — Other Ambulatory Visit (HOSPITAL_BASED_OUTPATIENT_CLINIC_OR_DEPARTMENT_OTHER): Payer: Medicare Other

## 2022-03-03 ENCOUNTER — Telehealth: Payer: Self-pay | Admitting: Licensed Clinical Social Worker

## 2022-03-03 NOTE — Patient Instructions (Signed)
Visit Information  Thank you for taking time to visit with me today. Please don't hesitate to contact me if I can be of assistance to you.   Following are the goals we discussed today:   Goals Addressed             This Visit's Progress    Obtain Supportive Services   On track    Care Coordination Interventions: Solution-Focused Strategies employed:  Active listening / Reflection utilized  Verbalization of feelings encouraged  LCSW informed patient's son of care coordination services. He is interested at this time and agreed to schedule initial appt         Our next appointment is by telephone on 03/07/22 at 9:30 AM  Please call the care guide team at [phone] if you need to cancel or reschedule your appointment.   If you are experiencing a Mental Health or [place] or need someone to talk to, please call the Suicide and Crisis Lifeline: 988 call 911   Patient verbalizes understanding of instructions and care plan provided today and agrees to view in [place]. Active MyChart status and patient understanding of how to access instructions and care plan via MyChart confirmed with patient.     [name], MSW, [email or web address] Phone [phone] 6:13 PM

## 2022-03-03 NOTE — Patient Outreach (Signed)
  Care Coordination   Initial Visit Note   03/03/2022 Name: JAMILE REKOWSKI MRN: [id number] DOB: Mar 10, 1934  JIZEL CHEEKS is a 88 y.o. year old female who sees Farrel Conners, MD for primary care. I spoke with  [employer]'s son, Mr. Judy, by phone today.  What matters to the patients health and wellness today?  Care Coordination    Goals Addressed             This Visit's Progress    Obtain Supportive Services   On track    Care Coordination Interventions: Solution-Focused Strategies employed:  Active listening / Reflection utilized  Verbalization of feelings encouraged  LCSW informed patient's son of care coordination services. He is interested at this time and agreed to schedule initial appt         SDOH assessments and interventions completed:  No     Care Coordination Interventions:  Yes, provided   Follow up plan: Follow up call scheduled for 1 week    Encounter Outcome:  Pt. Visit Completed   [name], MSW, [email or web address] Phone [phone] 6:12 PM

## 2022-03-07 ENCOUNTER — Telehealth: Payer: Self-pay | Admitting: Nurse Practitioner

## 2022-03-07 ENCOUNTER — Encounter (HOSPITAL_BASED_OUTPATIENT_CLINIC_OR_DEPARTMENT_OTHER): Payer: Self-pay | Admitting: Emergency Medicine

## 2022-03-07 ENCOUNTER — Ambulatory Visit: Payer: Self-pay | Admitting: Licensed Clinical Social Worker

## 2022-03-07 ENCOUNTER — Other Ambulatory Visit: Payer: Self-pay

## 2022-03-07 ENCOUNTER — Emergency Department (HOSPITAL_BASED_OUTPATIENT_CLINIC_OR_DEPARTMENT_OTHER): Payer: 59

## 2022-03-07 ENCOUNTER — Emergency Department (HOSPITAL_BASED_OUTPATIENT_CLINIC_OR_DEPARTMENT_OTHER)
Admission: EM | Admit: 2022-03-07 | Discharge: 2022-03-07 | Disposition: A | Discharge status: Home / Self Care | Payer: 59 | Attending: Emergency Medicine | Admitting: Emergency Medicine

## 2022-03-07 DIAGNOSIS — J449 Chronic obstructive pulmonary disease, unspecified: Secondary | ICD-10-CM | POA: Insufficient documentation

## 2022-03-07 DIAGNOSIS — K439 Ventral hernia without obstruction or gangrene: Secondary | ICD-10-CM | POA: Diagnosis not present

## 2022-03-07 DIAGNOSIS — K625 Hemorrhage of anus and rectum: Secondary | ICD-10-CM | POA: Diagnosis not present

## 2022-03-07 DIAGNOSIS — K5730 Diverticulosis of large intestine without perforation or abscess without bleeding: Secondary | ICD-10-CM | POA: Insufficient documentation

## 2022-03-07 DIAGNOSIS — K449 Diaphragmatic hernia without obstruction or gangrene: Secondary | ICD-10-CM | POA: Diagnosis not present

## 2022-03-07 DIAGNOSIS — Z79899 Other long term (current) drug therapy: Secondary | ICD-10-CM | POA: Diagnosis not present

## 2022-03-07 DIAGNOSIS — R197 Diarrhea, unspecified: Secondary | ICD-10-CM | POA: Diagnosis present

## 2022-03-07 DIAGNOSIS — N3289 Other specified disorders of bladder: Secondary | ICD-10-CM | POA: Diagnosis not present

## 2022-03-07 DIAGNOSIS — I700 Atherosclerosis of aorta: Secondary | ICD-10-CM | POA: Insufficient documentation

## 2022-03-07 DIAGNOSIS — I10 Essential (primary) hypertension: Secondary | ICD-10-CM | POA: Diagnosis not present

## 2022-03-07 LAB — CBC
HCT: 29.7 % — ABNORMAL LOW (ref 36.0–46.0)
Hemoglobin: 9.7 g/dL — ABNORMAL LOW (ref 12.0–15.0)
MCH: 31.5 pg (ref 26.0–34.0)
MCHC: 32.7 g/dL (ref 30.0–36.0)
MCV: 96.4 fL (ref 80.0–100.0)
Platelets: 485 10*3/uL — ABNORMAL HIGH (ref 150–400)
RBC: 3.08 MIL/uL — ABNORMAL LOW (ref 3.87–5.11)
RDW: 13.1 % (ref 11.5–15.5)
WBC: 8 10*3/uL (ref 4.0–10.5)
nRBC: 0 % (ref 0.0–0.2)

## 2022-03-07 LAB — COMPREHENSIVE METABOLIC PANEL
ALT: 26 U/L (ref 0–44)
AST: 28 U/L (ref 15–41)
Albumin: 3.7 g/dL (ref 3.5–5.0)
Alkaline Phosphatase: 43 U/L (ref 38–126)
Anion gap: 8 (ref 5–15)
BUN: 15 mg/dL (ref 8–23)
CO2: 26 mmol/L (ref 22–32)
Calcium: 10 mg/dL (ref 8.9–10.3)
Chloride: 102 mmol/L (ref 98–111)
Creatinine, Ser: 0.52 mg/dL (ref 0.44–1.00)
GFR, Estimated: >60 mL/min (ref >60)
Glucose, Bld: 105 mg/dL — ABNORMAL HIGH (ref 70–99)
Potassium: 4.5 mmol/L (ref 3.5–5.1)
Sodium: 136 mmol/L (ref 135–145)
Total Bilirubin: 0.3 mg/dL (ref 0.3–1.2)
Total Protein: 6.8 g/dL (ref 6.5–8.1)

## 2022-03-07 LAB — SEDIMENTATION RATE: Sed Rate: 49 mm/hr — ABNORMAL HIGH (ref 0–22)

## 2022-03-07 LAB — OCCULT BLOOD X 1 CARD TO LAB, STOOL: Fecal Occult Bld: POSITIVE — AB

## 2022-03-07 NOTE — Patient Instructions (Signed)
Visit Information  Thank you for taking time to visit with me today. Please don't hesitate to contact me if I can be of assistance to you.   Following are the goals we discussed today:   Goals Addressed             This Visit's Progress    Obtain Supportive Services   On track    Care Coordination Interventions: Solution-Focused Strategies employed:  Active listening / Reflection utilized  Verbalization of feelings encouraged  LCSW informed patient's son of care coordination services. He is interested at this time and agreed to schedule initial appt         Our next appointment is by telephone on 03/16/22 at 9:30 AM  Please call the care guide team at [phone] if you need to cancel or reschedule your appointment.   If you are experiencing a Mental Health or [place] or need someone to talk to, please call the Suicide and Crisis Lifeline: 988 call 911   Patient verbalizes understanding of instructions and care plan provided today and agrees to view in [facility]. Active MyChart status and patient understanding of how to access instructions and care plan via MyChart confirmed with patient.     [name], MSW, [email or web address] Phone [phone] 4:30 PM

## 2022-03-07 NOTE — ED Provider Notes (Signed)
[facility] Provider Note   CSN: [id number] Arrival date & time: 03/07/22  1649     History  Chief Complaint  Patient presents with   Rectal Bleeding    [name] is a 88 y.o. female.  Patient here with rectal bleeding and diarrhea for the last 4 to 5 days.  She just finished antibiotics for UTI.  She has been on oral steroids and mesalamine for proctosigmoid colitis.  She has been having some rectal bleeding here the last few months.  She follows with gastroenterology.  Feels similar to in the past with that.  She denies any chest pain or shortness of breath.  No pain with urination.  She has some clots passed but otherwise she denies any weakness, numbness, fatigue.  The history is provided by the patient.       Home Medications Prior to Admission medications   Medication Sig Start Date End Date Taking? Authorizing Provider  acetaminophen (TYLENOL) 500 MG tablet Take 500 mg by mouth every 6 (six) hours as needed for moderate pain.    [provider]  budesonide (ENTOCORT EC) 3 MG 24 hr capsule Take 3 capsules (9 mg total) by mouth daily. 12/16/21   Willia Craze, NP  cephALEXin (KEFLEX) 500 MG capsule Take 1 capsule (500 mg total) by mouth 3 (three) times daily. 03/01/22   Lucretia Kern, DO  Ferrous Sulfate (IRON) 325 (65 FE) MG TABS Take 1 tablet by mouth daily.     [provider]  fluticasone (FLOVENT HFA) 220 MCG/ACT inhaler Inhale 1 puff into the lungs in the morning and at bedtime. 10/01/21   Farrel Conners, MD  levothyroxine (SYNTHROID) 75 MCG tablet Take 1 tablet (75 mcg total) by mouth daily. 01/13/22   Farrel Conners, MD  mesalamine (ROWASA) 4 g enema Place 60 mLs (4 g total) rectally at bedtime. 10/22/21 11/29/21  Caren Griffins, MD  Spacer/Aero-Holding Chambers (E-Z SPACER) inhaler Use as instructed 11/08/17   Caren Macadam, MD      Allergies    Bolivia nut (berthollefia excelsa) skin  test, Fluconazole, Metronidazole, Pepcid [famotidine], Shellfish allergy, Macrobid [nitrofurantoin], and Pantoprazole    Review of Systems   Review of Systems  Physical Exam Updated Vital Signs BP (!) 115/23   Pulse 62   Temp 98.2 F (36.8 C)   Resp 13   SpO2 96%  Physical Exam Vitals and nursing note reviewed.  Constitutional:      General: She is not in acute distress.    Appearance: She is well-developed. She is not ill-appearing.  HENT:     Head: Normocephalic and atraumatic.     Nose: Nose normal.     Mouth/Throat:     Mouth: Mucous membranes are moist.  Eyes:     Extraocular Movements: Extraocular movements intact.     Conjunctiva/sclera: Conjunctivae normal.     Pupils: Pupils are equal, round, and reactive to light.  Cardiovascular:     Rate and Rhythm: Normal rate and regular rhythm.     Pulses: Normal pulses.     Heart sounds: Normal heart sounds. No murmur heard. Pulmonary:     Effort: Pulmonary effort is normal. No respiratory distress.     Breath sounds: Normal breath sounds.  Abdominal:     Palpations: Abdomen is soft.     Tenderness: There is no abdominal tenderness.  Genitourinary:    Rectum: Guaiac result positive.  Musculoskeletal:  General: No swelling.     Cervical back: Neck supple.  Skin:    General: Skin is warm and dry.     Capillary Refill: Capillary refill takes less than 2 seconds.  Neurological:     General: No focal deficit present.     Mental Status: She is alert.  Psychiatric:        Mood and Affect: Mood normal.     ED Results / Procedures / Treatments   Labs (all labs ordered are listed, but only abnormal results are displayed) Labs Reviewed  COMPREHENSIVE METABOLIC PANEL - Abnormal; Notable for the following components:      Result Value   Glucose, Bld 105 (*)    All other components within normal limits  CBC - Abnormal; Notable for the following components:   RBC 3.08 (*)    Hemoglobin 9.7 (*)    HCT 29.7 (*)     Platelets 485 (*)    All other components within normal limits  OCCULT BLOOD X 1 CARD TO LAB, STOOL - Abnormal; Notable for the following components:   Fecal Occult Bld POSITIVE (*)    All other components within normal limits  GASTROINTESTINAL PANEL BY PCR, STOOL (REPLACES STOOL CULTURE)  C DIFFICILE QUICK SCREEN W PCR REFLEX    SEDIMENTATION RATE  C-REACTIVE PROTEIN    EKG None  Radiology CT ABDOMEN PELVIS WO CONTRAST  Result Date: 03/07/2022 CLINICAL DATA:  Nonlocalized abdominal pain. Diarrhea and bleeding with clots started about 5 days ago EXAM: CT ABDOMEN AND PELVIS WITHOUT CONTRAST TECHNIQUE: Multidetector CT imaging of the abdomen and pelvis was performed following the standard protocol without IV contrast. RADIATION DOSE REDUCTION: This exam was performed according to the departmental dose-optimization program which includes automated exposure control, adjustment of the mA and/or kV according to patient size and/or use of iterative reconstruction technique. COMPARISON:  CT 10/19/2021 and older.  Multiple prior exams. FINDINGS: Lower chest: Mild linear opacity lung bases likely scar or atelectasis. No pleural effusion. There is a 5 mm nodule right lower lobe posteriorly on series 4, image 7 which is unchanged from the most recent prior. In addition this has been present since at least June 2021 demonstrating long-term stability. No specific imaging follow-up. Coronary artery calcifications are seen. Large hiatal hernia. Hepatobiliary: On this non IV contrast exam grossly the liver is unremarkable. Previous cholecystectomy. Ectatic appearance of the biliary tree is again noted. Pancreas: Preserved pancreatic parenchyma overall with mild atrophy, unchanged from previous. No obvious mass. Spleen: Spleen is nonenlarged. Adrenals/Urinary Tract: Adrenal glands are preserved. No abnormal calcifications seen within either kidney nor along the course of either ureter. Preserved contours of the  urinary bladder with a low-lying components. Please correlate with pelvic prolapse of the bladder. There is funneling of the lower portion of the bladder. Stomach/Bowel: Large bowel is nondilated. There is some oral contrast along the colon. Redundant course of the colon with the cecum residing in the anterior left hemipelvis. The appendix is not well seen but there is no specific pericecal stranding or fluid. However there is colonic diverticulosis of the sigmoid colon with wall thickening. Slight adjacent stranding. Please correlate for any clinical evidence of diverticulitis. No obstruction, free air or fluid collections. There is some adjacent vascular engorgement. This was seen previously as well. Stomach is nondilated but again there is a hiatal hernia. The small bowel is nondilated. Surgical changes along loops of small bowel in the central anterior pelvis. Vascular/Lymphatic: Normal caliber aorta and IVC with  some scattered vascular calcifications. No specific abnormal lymph node enlargement identified in the abdomen and pelvis. Reproductive: Status post hysterectomy. No adnexal masses. Other: Midline anterior abdominal wall hernia containing fat in the epigastric region. Musculoskeletal: There is some fatty atrophy of the muscles of the abdomen and pelvis. Degenerative changes seen throughout the spine and pelvis. There is some trace anterolisthesis of L4 on L5. Multilevel stenosis. Advanced degenerative changes of the right hip as well. IMPRESSION: 1. There is colonic diverticulosis of the sigmoid colon with wall thickening and stranding. Please correlate for any clinical evidence of diverticulitis. No complicating features. No obstruction, free air or fluid collections. 2. Large hiatal hernia. 3. Midline anterior abdominal wall hernia containing fat in the epigastric region. 4. Low-lying urinary bladder with funneling of the lower portion of the bladder. Please correlate with pelvic prolapse of the  bladder. Aortic Atherosclerosis (ICD10-I70.0). Electronically Signed   By: Jill Side M.D.   On: 03/07/2022 18:50    Procedures Procedures    Medications Ordered in ED Medications - No data to display  ED Course/ Medical Decision Making/ A&P                             Medical Decision Making Amount and/or Complexity of Data Reviewed Labs: ordered. Radiology: ordered.   BRITNIE COLVILLE is here with rectal bleeding.  History of proctosigmoid colitis on steroids and mesalamine chronically now for this.  History of hypertension, high cholesterol, COPD, bowel obstruction.  Is not having any abdominal pain but the last 4 to 5 days she has been having some bloody bowel movements.  Passing clots at times.  She is not on blood thinners.  She not having any pain now.  Rectal exam is overall unremarkable.  Hemoccult was positive.  She has normal vitals.  Differential diagnosis is ongoing colitis versus C. difficile versus less likely bowel obstruction.  Seems less likely to be diverticular bleed.  CBC, CMP including CT scan abdomen were ordered.  Her hemoglobin is 9.7.  Hemoglobin a few months ago was around 12.  Lab work otherwise per my review and interpretation is unremarkable.  CT scan of the abdomen pelvis is unremarkable except for may be diverticulitis.  There is no bowel obstruction or abscess or free air.  I talked with Dr. Bryan Lemma with gastroenterology.  Suspicion is that this is ongoing sigmoid/proctocolitis.  She was on mesalamine enemas prior to be switching over to oral mesalamine.  She has been on a budesonide taper.  Ultimately thinks that she probably needs to go back on mesalamine enemas but patient does not want to do this.  We gave patient the option to be admitted overnight or follow-up with GI team tomorrow morning outpatient.  She prefers to follow-up outpatient.  Will have them talk with treatment options for her tomorrow.  ESR and CRP has been sent off.  She is unable to give  stool sample here to check for C. difficile but will give her collection kit for that.  She understands return precautions.  Discharged in good condition.  This chart was dictated using voice recognition software.  Despite best efforts to proofread,  errors can occur which can change the documentation meaning.         Final Clinical Impression(s) / ED Diagnoses Final diagnoses:  Rectal bleeding    Rx / DC Orders ED Discharge Orders     None  Lennice Sites, DO 03/07/22 2005

## 2022-03-07 NOTE — Telephone Encounter (Signed)
Spoke with pt's son. Pt's son stated that pt is passing several blood clots. Pt had a UTI last week and was prescribed an antibiotic and bleeding started soon after pt started antibiotic. Pt also has weakness and dizziness. Pt's son reported that pt stopped the antibiotic today and did not have blood in the last 2 bowel movements. Pt's son was concerned that antibiotic was causing rectal bleeding. Let pt's son know that pt should go to ER like PCP recommended.

## 2022-03-07 NOTE — Telephone Encounter (Signed)
Inbound call from patient's son, states she has been passing blot clots in her stool. Stated her PCP recommended to go to the ED, however patient's son is calling to see if same recommendation is given or if patient should keep her appointment with Dr. Silverio Decamp on 2/1. Requesting to speak to a nurse. Please advise.

## 2022-03-07 NOTE — ED Triage Notes (Signed)
Diarrhea and bleeding with blood clots started about 5 day ago

## 2022-03-07 NOTE — ED Notes (Signed)
Discharge instructions and follow up care reviewed and explained, pt and family verbalized understanding and had no further questions on d/c. Pt alert and in no obvious distress on d/c.

## 2022-03-07 NOTE — Discharge Instructions (Signed)
Call your gastroenterologist tomorrow morning to confirm your appointment.  Please return if symptoms worsen.

## 2022-03-07 NOTE — Patient Outreach (Signed)
  Care Coordination   Initial Visit Note   03/07/2022 Name: ELLIANAH CORDY MRN: [id number] DOB: 31-Oct-1934  ISLAH EVE is a 88 y.o. year old female who sees Farrel Conners, MD for primary care. I spoke with  [employer]'s son, Mr. Bram, by phone today.  What matters to the patients health and wellness today?  Caregiver Stress and Supportive Resources for in-home support    Goals Addressed             This Visit's Progress    Obtain Supportive Services   On track    Care Coordination Interventions: Solution-Focused Strategies employed:  Active listening / Reflection utilized  Verbalization of feelings encouraged  LCSW informed patient's son of care coordination services. He is interested at this time and agreed to schedule initial appt         SDOH assessments and interventions completed:  No     Care Coordination Interventions:  Yes, provided   Follow up plan: Follow up call scheduled for 1-2 weeks    Encounter Outcome:  Pt. Visit Completed   [name], MSW, [email or web address] Phone [phone] 4:30 PM

## 2022-03-08 ENCOUNTER — Telehealth: Payer: Self-pay | Admitting: Family Medicine

## 2022-03-08 LAB — C-REACTIVE PROTEIN: CRP: 2.3 mg/dL — ABNORMAL HIGH (ref <1.0)

## 2022-03-08 NOTE — Telephone Encounter (Signed)
Spoke with the patient's son, [name] and informed him of the message below.

## 2022-03-08 NOTE — Telephone Encounter (Signed)
Pt's son called to inform MD that Pt only had 3 pills left of the antibiotics when they decided to stop because Pt had extreme diarrhea. Pt was taken to the ED yesterday and diarrhea is now somewhat subsiding. Pt has an appt on Thursday (Proctisigmoiditis/ rectal bleeding)  Son asked if MD wants Pt to come in to do a FU to the ED visit?  Please advise.

## 2022-03-08 NOTE — Telephone Encounter (Signed)
No she doesn't need a follow up.

## 2022-03-09 NOTE — Telephone Encounter (Signed)
Pt started having diarrhea soon after starting antibiotics for a UTI. Pt has abdominal pain and rectal bleeding as well. Pt went to ER on 03/07/22. Pt's son called to cancel appointment tomorrow with Dr. Silverio Decamp tomorrow because diarrhea is too severe for pt to come in. Let son know that I can send a message to the provider and we can schedule pt for an appt  Friday 03/11/22 with Vicie Mutters. Pt's son verbalized understanding.

## 2022-03-09 NOTE — Telephone Encounter (Signed)
Patient's son is calling to cancel appointment for tomorrow morning with Dr Silverio Decamp states his mother can't wait that long so he is going to take her to the ED. Wishing to possibly speak to a nurse again about what's going on. Please advise

## 2022-03-09 NOTE — Progress Notes (Deleted)
03/09/2022 TANDY ZOTO [id number] August 20, 1934  Referring provider: Farrel Conners, MD Primary GI doctor: Dr. Silverio Decamp  ASSESSMENT AND PLAN:   There are no diagnoses linked to this encounter.   Patient Care Team: Farrel Conners, MD as PCP - General (Family Medicine) Jari Pigg, MD as Attending Physician (Dermatology) Clent Jacks, MD (Ophthalmology) Viona Gilmore, [facility] (Inactive) as Pharmacist (Pharmacist)  HISTORY OF PRESENT ILLNESS: 88 y.o. female with a past medical history of chronic proctosigmoiditis, chronic anemia, hiatal hernia, hypertension, COPD and hypothyroidism and others listed below presents for evaluation of rectal bleeding and AB pain.    She  reports that she has never smoked. She has never used smokeless tobacco. She reports that she does not drink alcohol and does not use drugs.  RELEVANT LABS AND IMAGING: CBC    Component Value Date/Time   WBC 8.0 03/07/2022 1711   RBC 3.08 (L) 03/07/2022 1711   HGB 9.7 (L) 03/07/2022 1711   HGB 11.8 (L) 01/19/2022 1010   HCT 29.7 (L) 03/07/2022 1711   PLT 485 (H) 03/07/2022 1711   PLT 354 01/19/2022 1010   MCV 96.4 03/07/2022 1711   MCV 91.8 09/14/2015 1447   MCH 31.5 03/07/2022 1711   MCHC 32.7 03/07/2022 1711   RDW 13.1 03/07/2022 1711   LYMPHSABS 2.0 01/19/2022 1010   MONOABS 1.0 01/19/2022 1010   EOSABS 0.2 01/19/2022 1010   BASOSABS 0.1 01/19/2022 1010   Recent Labs    04/27/21 1537 07/20/21 1011 10/19/21 1203 10/20/21 0418 10/21/21 0429 10/22/21 0409 11/01/21 1225 11/08/21 1145 01/19/22 1010 03/07/22 1711  HGB 10.9* 10.6* 8.2* 7.5* 7.6* 8.1* 8.7 Repeated and verified X2.* 8.4* 11.8* 9.7*     CMP     Component Value Date/Time   NA 136 03/07/2022 1711   K 4.5 03/07/2022 1711   CL 102 03/07/2022 1711   CO2 26 03/07/2022 1711   GLUCOSE 105 (H) 03/07/2022 1711   BUN 15 03/07/2022 1711   CREATININE 0.52 03/07/2022 1711   CREATININE 0.64 08/14/2020 1618   CALCIUM 10.0  03/07/2022 1711   CALCIUM 10.6 (H) 06/06/2009 0142   PROT 6.8 03/07/2022 1711   ALBUMIN 3.7 03/07/2022 1711   AST 28 03/07/2022 1711   ALT 26 03/07/2022 1711   ALKPHOS 43 03/07/2022 1711   BILITOT 0.3 03/07/2022 1711   GFRNONAA >60 03/07/2022 1711   GFRNONAA >89 11/03/2014 1807   GFRAA >60 08/09/2019 0148   GFRAA >89 11/03/2014 1807      Latest Ref Rng & Units 03/07/2022    5:11 PM 11/08/2021   11:45 AM 10/19/2021   12:03 PM  Hepatic Function  Total Protein 6.5 - 8.1 g/dL 6.8  6.9  7.1   Albumin 3.5 - 5.0 g/dL 3.7  4.0  4.1   AST 15 - 41 U/L '28  18  19   '$ ALT 0 - 44 U/L '26  10  10   '$ Alk Phosphatase 38 - 126 U/L 43  38  48   Total Bilirubin 0.3 - 1.2 mg/dL 0.3  0.2  0.3       Current Medications:   Current Outpatient Medications (Endocrine & Metabolic):    budesonide (ENTOCORT EC) 3 MG 24 hr capsule, Take 3 capsules (9 mg total) by mouth daily.   levothyroxine (SYNTHROID) 75 MCG tablet, Take 1 tablet (75 mcg total) by mouth daily.   Current Outpatient Medications (Respiratory):    fluticasone (FLOVENT HFA) 220 MCG/ACT inhaler, Inhale 1 puff  into the lungs in the morning and at bedtime.  Current Outpatient Medications (Analgesics):    acetaminophen (TYLENOL) 500 MG tablet, Take 500 mg by mouth every 6 (six) hours as needed for moderate pain.  Current Outpatient Medications (Hematological):    Ferrous Sulfate (IRON) 325 (65 FE) MG TABS, Take 1 tablet by mouth daily.   Current Outpatient Medications (Other):    cephALEXin (KEFLEX) 500 MG capsule, Take 1 capsule (500 mg total) by mouth 3 (three) times daily.   mesalamine (ROWASA) 4 g enema, Place 60 mLs (4 g total) rectally at bedtime.   Spacer/Aero-Holding Chambers (E-Z SPACER) inhaler, Use as instructed  Medical History:  Past Medical History:  Diagnosis Date   ABDOMINAL PAIN, CHRONIC 05/28/2008   ALLERGIC RHINITIS 09/21/2006   ANEMIA-NOS 09/21/2006   ASTHMA 09/21/2006   ASYMPTOMATIC POSTMENOPAUSAL STATUS 05/28/2008    Cataract    CHEST PAIN, ATYPICAL 05/28/2008   COPD (chronic obstructive pulmonary disease) ([place])    COVID-19 virus infection 02/2020   DDD (degenerative disc disease)    DIVERTICULITIS, HX OF 09/21/2006   GERD 09/21/2006   HYPERCHOLESTEROLEMIA 06/05/2009   HYPERTENSION 09/21/2006   HYPOTHYROIDISM 09/21/2006   Peptic ulcer disease    Purpura ([place])    RENAL INSUFFICIENCY 09/21/2006   Shingles    Small bowel obstruction ([facility])    Somatization disorder 04/25/2007   UNSPECIFIED URINARY CALCULUS 05/28/2008   UTI (urinary tract infection) 07/2019   UTI (urinary tract infection) 11/08/2021   Allergies:  Allergies  Allergen Reactions   Bolivia Nut Daisey Must) Skin Test Anaphylaxis   Fluconazole Shortness Of Breath   Metronidazole Shortness Of Breath and Nausea And Vomiting   Pepcid [Famotidine] Shortness Of Breath and Other (See Comments)    Dizziness   Shellfish Allergy Anaphylaxis   Macrobid [Nitrofurantoin]     REACTION: Syncope   Pantoprazole Other (See Comments)    CHEST PAIN     Surgical History:  She  has a past surgical history that includes Nasal sinus surgery PO:[id number]); Dental surgery (1970); Appendectomy (1959); Cholecystectomy (1983); Abdominal hysterectomy (1981); Pulmonary thrombosis; Tonsillectomy; acute Nephritis (1958); Tubal ligation; Cesarean section; Colon surgery; Flexible sigmoidoscopy (N/A, 11/18/2019); biopsy (11/18/2019); Flexible sigmoidoscopy (N/A, 10/21/2021); and biopsy (10/21/2021). Family History:  Her family history includes Allergic rhinitis in her sister; Arthritis in her sister; Bladder Cancer in her father; Cirrhosis in her son; Hemochromatosis in her cousin and son; Hypertension in her father; Marfan syndrome in her son; Other in her brother and father; Pancreatic cancer in her mother; Polycystic kidney disease in her mother.  REVIEW OF SYSTEMS  : All other systems reviewed and negative except where noted in the History of Present  Illness.  PHYSICAL EXAM: There were no vitals taken for this visit. General:   Pleasant, well developed female in no acute distress Head:   Normocephalic and atraumatic. Eyes:  sclerae anicteric,conjunctive pink  Heart:   {HEART EXAM HEM/ONC:21750} Pulm:  Clear anteriorly; no wheezing Abdomen:   {BlankSingle:19197::"Distended","Ridged","Soft"}, {BlankSingle:19197::"Flat","Obese","Non-distended"} AB, {BlankSingle:19197::"Absent","Hyperactive, tinkling","Hypoactive","Sluggish","Active"} bowel sounds. {actendernessAB:27319} tenderness {anatomy; site abdomen:5010}. {BlankMultiple:19196::"Without guarding","With guarding","Without rebound","With rebound"}, No organomegaly appreciated. Rectal: {acrectalexam:27461} Extremities:  {With/Without:304960234} edema. Msk: Symmetrical without gross deformities. Peripheral pulses intact.  Neurologic:  Alert and  oriented x4;  No focal deficits.  Skin:   Dry and intact without significant lesions or rashes. Psychiatric:  Cooperative. Normal mood and affect.   Vladimir Crofts, PA-C 3:59 PM

## 2022-03-10 ENCOUNTER — Ambulatory Visit: Payer: Medicare Other | Admitting: Gastroenterology

## 2022-03-10 NOTE — Telephone Encounter (Signed)
Patient's son is calling states that once again he does not think patient will be able to make it tomorrow due to the diarrhea being so bad. He was hoping for something next week but I have no openings on my schedule, requesting for a nurse to call and maybe figure something else. Please advise

## 2022-03-10 NOTE — Telephone Encounter (Signed)
Pt's son called back. Pt is afraid she will be incontinent of stool at her appointment and wanted to reschedule. Pt rescheduled to 03/16/22 at 1:30 pm with Alonza Bogus.

## 2022-03-11 ENCOUNTER — Ambulatory Visit: Payer: 59 | Admitting: Physician Assistant

## 2022-03-13 ENCOUNTER — Other Ambulatory Visit: Payer: Self-pay

## 2022-03-13 ENCOUNTER — Encounter (HOSPITAL_COMMUNITY): Payer: Self-pay | Admitting: Emergency Medicine

## 2022-03-13 ENCOUNTER — Emergency Department (HOSPITAL_COMMUNITY)
Admission: EM | Admit: 2022-03-13 | Discharge: 2022-03-13 | Disposition: A | Discharge status: Home / Self Care | Payer: 59 | Attending: Emergency Medicine | Admitting: Emergency Medicine

## 2022-03-13 ENCOUNTER — Other Ambulatory Visit: Payer: Self-pay | Admitting: Nurse Practitioner

## 2022-03-13 DIAGNOSIS — I10 Essential (primary) hypertension: Secondary | ICD-10-CM | POA: Diagnosis not present

## 2022-03-13 DIAGNOSIS — K449 Diaphragmatic hernia without obstruction or gangrene: Secondary | ICD-10-CM | POA: Diagnosis not present

## 2022-03-13 DIAGNOSIS — K439 Ventral hernia without obstruction or gangrene: Secondary | ICD-10-CM | POA: Insufficient documentation

## 2022-03-13 DIAGNOSIS — R197 Diarrhea, unspecified: Secondary | ICD-10-CM | POA: Diagnosis not present

## 2022-03-13 DIAGNOSIS — R109 Unspecified abdominal pain: Secondary | ICD-10-CM | POA: Diagnosis present

## 2022-03-13 DIAGNOSIS — R58 Hemorrhage, not elsewhere classified: Secondary | ICD-10-CM | POA: Diagnosis not present

## 2022-03-13 DIAGNOSIS — R1084 Generalized abdominal pain: Secondary | ICD-10-CM | POA: Diagnosis not present

## 2022-03-13 DIAGNOSIS — K5730 Diverticulosis of large intestine without perforation or abscess without bleeding: Secondary | ICD-10-CM

## 2022-03-13 DIAGNOSIS — N390 Urinary tract infection, site not specified: Secondary | ICD-10-CM | POA: Diagnosis not present

## 2022-03-13 DIAGNOSIS — K625 Hemorrhage of anus and rectum: Secondary | ICD-10-CM

## 2022-03-13 LAB — CBC WITH DIFFERENTIAL/PLATELET
Abs Immature Granulocytes: 0.05 10*3/uL (ref 0.00–0.07)
Basophils Absolute: 0.1 10*3/uL (ref 0.0–0.1)
Basophils Relative: 1 %
Eosinophils Absolute: 0.1 10*3/uL (ref 0.0–0.5)
Eosinophils Relative: 1 %
HCT: 32.4 % — ABNORMAL LOW (ref 36.0–46.0)
Hemoglobin: 10.2 g/dL — ABNORMAL LOW (ref 12.0–15.0)
Immature Granulocytes: 1 %
Lymphocytes Relative: 13 %
Lymphs Abs: 1.2 10*3/uL (ref 0.7–4.0)
MCH: 31.2 pg (ref 26.0–34.0)
MCHC: 31.5 g/dL (ref 30.0–36.0)
MCV: 99.1 fL (ref 80.0–100.0)
Monocytes Absolute: 1.1 10*3/uL — ABNORMAL HIGH (ref 0.1–1.0)
Monocytes Relative: 12 %
Neutro Abs: 6.9 10*3/uL (ref 1.7–7.7)
Neutrophils Relative %: 72 %
Platelets: 470 10*3/uL — ABNORMAL HIGH (ref 150–400)
RBC: 3.27 MIL/uL — ABNORMAL LOW (ref 3.87–5.11)
RDW: 13.2 % (ref 11.5–15.5)
WBC: 9.4 10*3/uL (ref 4.0–10.5)
nRBC: 0 % (ref 0.0–0.2)

## 2022-03-13 LAB — URINALYSIS, ROUTINE W REFLEX MICROSCOPIC
Bacteria, UA: NONE SEEN
Bilirubin Urine: NEGATIVE
Glucose, UA: NEGATIVE mg/dL
Hgb urine dipstick: NEGATIVE
Ketones, ur: NEGATIVE mg/dL
Nitrite: NEGATIVE
Protein, ur: NEGATIVE mg/dL
Specific Gravity, Urine: 1.01 (ref 1.005–1.030)
Trans Epithel, UA: 2
pH: 6 (ref 5.0–8.0)

## 2022-03-13 LAB — COMPREHENSIVE METABOLIC PANEL
ALT: 45 U/L — ABNORMAL HIGH (ref 0–44)
AST: 44 U/L — ABNORMAL HIGH (ref 15–41)
Albumin: 3.2 g/dL — ABNORMAL LOW (ref 3.5–5.0)
Alkaline Phosphatase: 54 U/L (ref 38–126)
Anion gap: 8 (ref 5–15)
BUN: 16 mg/dL (ref 8–23)
CO2: 23 mmol/L (ref 22–32)
Calcium: 8.9 mg/dL (ref 8.9–10.3)
Chloride: 105 mmol/L (ref 98–111)
Creatinine, Ser: 0.58 mg/dL (ref 0.44–1.00)
GFR, Estimated: >60 mL/min (ref >60)
Glucose, Bld: 135 mg/dL — ABNORMAL HIGH (ref 70–99)
Potassium: 4.3 mmol/L (ref 3.5–5.1)
Sodium: 136 mmol/L (ref 135–145)
Total Bilirubin: 0.3 mg/dL (ref 0.3–1.2)
Total Protein: 6.7 g/dL (ref 6.5–8.1)

## 2022-03-13 NOTE — ED Triage Notes (Signed)
BIBA from home for diarrhea with bright red blood and RLQ abd pain radiating to LLQ x6 days.  GI appt this week and patient was unable to make it  Denies blood thinner usage.  Pt reports diarrhea started when on abt for UTI

## 2022-03-13 NOTE — Discharge Instructions (Addendum)
It was our pleasure to provide your ER care today - we hope that you feel better. Your hemoglobin/blood count appears stable compared to prior testing.   Drink plenty of fluids/stay well hydrated. Continue your meds.  Follow up closely with GI doctor in the coming week - call office tomorrow AM to arrange appointment.   Return to ER if worse, new symptoms, fevers, new or worsening or severe abdominal pain, persistent vomiting, persistent heavy bleeding, weak/faint, or other concern.

## 2022-03-13 NOTE — ED Provider Notes (Signed)
[facility] Provider Note   CSN: [id number] Arrival date & time: 03/13/22  1536     History  Chief Complaint  Patient presents with   Rectal Bleeding    [name] is a 88 y.o. female.  Patient  indicates hx colitis/proctitis, and that had episode bright red blood per rectum today, and felt mildly, generally weak. Indicates it is not uncommon for her to have a small amount of rectal bleeding, but this seems more. No melena or black or tarry stools - stools have been light brown, 2-3 per day. Denies current abx use. No abd pain or distension or vomiting. No dysuria. No fever or chills. No anticoagulant use. No other abnormal bruising or bleeding.   The history is provided by the patient, medical records and the EMS personnel.  Abdominal Pain Associated symptoms: no chest pain, no chills, no dysuria, no fever, no hematuria, no nausea, no shortness of breath and no vomiting        Home Medications Prior to Admission medications   Medication Sig Start Date End Date Taking? Authorizing Provider  acetaminophen (TYLENOL) 500 MG tablet Take 500 mg by mouth every 6 (six) hours as needed for moderate pain.    [provider]  budesonide (ENTOCORT EC) 3 MG 24 hr capsule Take 3 capsules (9 mg total) by mouth daily. 12/16/21   Willia Craze, NP  cephALEXin (KEFLEX) 500 MG capsule Take 1 capsule (500 mg total) by mouth 3 (three) times daily. 03/01/22   Lucretia Kern, DO  Ferrous Sulfate (IRON) 325 (65 FE) MG TABS Take 1 tablet by mouth daily.     [provider]  fluticasone (FLOVENT HFA) 220 MCG/ACT inhaler Inhale 1 puff into the lungs in the morning and at bedtime. 10/01/21   Farrel Conners, MD  levothyroxine (SYNTHROID) 75 MCG tablet Take 1 tablet (75 mcg total) by mouth daily. 01/13/22   Farrel Conners, MD  mesalamine (ROWASA) 4 g enema Place 60 mLs (4 g total) rectally at bedtime. 10/22/21 11/29/21  Caren Griffins,  MD  Spacer/Aero-Holding Chambers (E-Z SPACER) inhaler Use as instructed 11/08/17   Caren Macadam, MD      Allergies    Bolivia nut (berthollefia excelsa) skin test, Fluconazole, Metronidazole, Pepcid [famotidine], Shellfish allergy, Macrobid [nitrofurantoin], and Pantoprazole    Review of Systems   Review of Systems  Constitutional:  Negative for chills and fever.  Respiratory:  Negative for shortness of breath.   Cardiovascular:  Negative for chest pain.  Gastrointestinal:  Positive for abdominal pain. Negative for nausea and vomiting.  Genitourinary:  Negative for dysuria, flank pain and hematuria.  Musculoskeletal:  Negative for back pain.  Skin:  Negative for rash.  Neurological:  Negative for headaches.  Hematological:  Does not bruise/bleed easily.  Psychiatric/Behavioral:  Negative for confusion.     Physical Exam Updated Vital Signs BP (!) 148/62 (BP Location: Left Arm)   Pulse 95   Temp 98.3 F (36.8 C) (Oral)   Resp 16   SpO2 96%  Physical Exam Vitals and nursing note reviewed.  Constitutional:      Appearance: Normal appearance. She is well-developed.  HENT:     Head: Atraumatic.     Nose: Nose normal.     Mouth/Throat:     Mouth: Mucous membranes are moist.  Eyes:     General: No scleral icterus.    Conjunctiva/sclera: Conjunctivae normal.     Pupils:  Pupils are equal, round, and reactive to light.  Neck:     Trachea: No tracheal deviation.  Cardiovascular:     Rate and Rhythm: Normal rate and regular rhythm.     Pulses: Normal pulses.     Heart sounds: Normal heart sounds. No murmur heard.    No friction rub. No gallop.  Pulmonary:     Effort: Pulmonary effort is normal. No respiratory distress.     Breath sounds: Normal breath sounds.  Abdominal:     General: There is no distension.     Palpations: Abdomen is soft. There is no mass.     Tenderness: There is no abdominal tenderness. There is no guarding.  Genitourinary:    Comments: Scant  light brown stool, w streak brb Musculoskeletal:        General: No swelling.     Cervical back: Normal range of motion and neck supple. No rigidity. No muscular tenderness.  Skin:    General: Skin is warm and dry.     Findings: No rash.  Neurological:     Mental Status: She is alert.     Comments: Alert, speech normal.   Psychiatric:        Mood and Affect: Mood normal.     ED Results / Procedures / Treatments   Labs (all labs ordered are listed, but only abnormal results are displayed) Results for orders placed or performed during the hospital encounter of 03/13/22  CBC with Differential  Result Value Ref Range   WBC 9.4 4.0 - 10.5 K/uL   RBC 3.27 (L) 3.87 - 5.11 MIL/uL   Hemoglobin 10.2 (L) 12.0 - 15.0 g/dL   HCT 32.4 (L) 36.0 - 46.0 %   MCV 99.1 80.0 - 100.0 fL   MCH 31.2 26.0 - 34.0 pg   MCHC 31.5 30.0 - 36.0 g/dL   RDW 13.2 11.5 - 15.5 %   Platelets 470 (H) 150 - 400 K/uL   nRBC 0.0 0.0 - 0.2 %   Neutrophils Relative % 72 %   Neutro Abs 6.9 1.7 - 7.7 K/uL   Lymphocytes Relative 13 %   Lymphs Abs 1.2 0.7 - 4.0 K/uL   Monocytes Relative 12 %   Monocytes Absolute 1.1 (H) 0.1 - 1.0 K/uL   Eosinophils Relative 1 %   Eosinophils Absolute 0.1 0.0 - 0.5 K/uL   Basophils Relative 1 %   Basophils Absolute 0.1 0.0 - 0.1 K/uL   Immature Granulocytes 1 %   Abs Immature Granulocytes 0.05 0.00 - 0.07 K/uL  Comprehensive metabolic panel  Result Value Ref Range   Sodium 136 135 - 145 mmol/L   Potassium 4.3 3.5 - 5.1 mmol/L   Chloride 105 98 - 111 mmol/L   CO2 23 22 - 32 mmol/L   Glucose, Bld 135 (H) 70 - 99 mg/dL   BUN 16 8 - 23 mg/dL   Creatinine, Ser 0.58 0.44 - 1.00 mg/dL   Calcium 8.9 8.9 - 10.3 mg/dL   Total Protein 6.7 6.5 - 8.1 g/dL   Albumin 3.2 (L) 3.5 - 5.0 g/dL   AST 44 (H) 15 - 41 U/L   ALT 45 (H) 0 - 44 U/L   Alkaline Phosphatase 54 38 - 126 U/L   Total Bilirubin 0.3 0.3 - 1.2 mg/dL   GFR, Estimated >60 >60 mL/min   Anion gap 8 5 - 15  Urinalysis,  Routine w reflex microscopic -Urine, Clean Catch  Result Value Ref Range   Color, Urine  YELLOW YELLOW   APPearance CLEAR CLEAR   Specific Gravity, Urine 1.010 1.005 - 1.030   pH 6.0 5.0 - 8.0   Glucose, UA NEGATIVE NEGATIVE mg/dL   Hgb urine dipstick NEGATIVE NEGATIVE   Bilirubin Urine NEGATIVE NEGATIVE   Ketones, ur NEGATIVE NEGATIVE mg/dL   Protein, ur NEGATIVE NEGATIVE mg/dL   Nitrite NEGATIVE NEGATIVE   Leukocytes,Ua MODERATE (A) NEGATIVE   RBC / HPF 0-5 0 - 5 RBC/hpf   WBC, UA 6-10 0 - 5 WBC/hpf   Bacteria, UA NONE SEEN NONE SEEN   Squamous Epithelial / HPF 0-5 0 - 5 /HPF   Trans Epithel, UA 2    Mucus PRESENT    CT ABDOMEN PELVIS WO CONTRAST  Result Date: 03/07/2022 CLINICAL DATA:  Nonlocalized abdominal pain. Diarrhea and bleeding with clots started about 5 days ago EXAM: CT ABDOMEN AND PELVIS WITHOUT CONTRAST TECHNIQUE: Multidetector CT imaging of the abdomen and pelvis was performed following the standard protocol without IV contrast. RADIATION DOSE REDUCTION: This exam was performed according to the departmental dose-optimization program which includes automated exposure control, adjustment of the mA and/or kV according to patient size and/or use of iterative reconstruction technique. COMPARISON:  CT 10/19/2021 and older.  Multiple prior exams. FINDINGS: Lower chest: Mild linear opacity lung bases likely scar or atelectasis. No pleural effusion. There is a 5 mm nodule right lower lobe posteriorly on series 4, image 7 which is unchanged from the most recent prior. In addition this has been present since at least June 2021 demonstrating long-term stability. No specific imaging follow-up. Coronary artery calcifications are seen. Large hiatal hernia. Hepatobiliary: On this non IV contrast exam grossly the liver is unremarkable. Previous cholecystectomy. Ectatic appearance of the biliary tree is again noted. Pancreas: Preserved pancreatic parenchyma overall with mild atrophy, unchanged  from previous. No obvious mass. Spleen: Spleen is nonenlarged. Adrenals/Urinary Tract: Adrenal glands are preserved. No abnormal calcifications seen within either kidney nor along the course of either ureter. Preserved contours of the urinary bladder with a low-lying components. Please correlate with pelvic prolapse of the bladder. There is funneling of the lower portion of the bladder. Stomach/Bowel: Large bowel is nondilated. There is some oral contrast along the colon. Redundant course of the colon with the cecum residing in the anterior left hemipelvis. The appendix is not well seen but there is no specific pericecal stranding or fluid. However there is colonic diverticulosis of the sigmoid colon with wall thickening. Slight adjacent stranding. Please correlate for any clinical evidence of diverticulitis. No obstruction, free air or fluid collections. There is some adjacent vascular engorgement. This was seen previously as well. Stomach is nondilated but again there is a hiatal hernia. The small bowel is nondilated. Surgical changes along loops of small bowel in the central anterior pelvis. Vascular/Lymphatic: Normal caliber aorta and IVC with some scattered vascular calcifications. No specific abnormal lymph node enlargement identified in the abdomen and pelvis. Reproductive: Status post hysterectomy. No adnexal masses. Other: Midline anterior abdominal wall hernia containing fat in the epigastric region. Musculoskeletal: There is some fatty atrophy of the muscles of the abdomen and pelvis. Degenerative changes seen throughout the spine and pelvis. There is some trace anterolisthesis of L4 on L5. Multilevel stenosis. Advanced degenerative changes of the right hip as well. IMPRESSION: 1. There is colonic diverticulosis of the sigmoid colon with wall thickening and stranding. Please correlate for any clinical evidence of diverticulitis. No complicating features. No obstruction, free air or fluid collections. 2.  Large hiatal  hernia. 3. Midline anterior abdominal wall hernia containing fat in the epigastric region. 4. Low-lying urinary bladder with funneling of the lower portion of the bladder. Please correlate with pelvic prolapse of the bladder. Aortic Atherosclerosis (ICD10-I70.0). Electronically Signed   By: Jill Side M.D.   On: 03/07/2022 18:50    EKG EKG Interpretation  Date/Time:  Sunday March 13 2022 15:55:45 EST Ventricular Rate:  85 PR Interval:  154 QRS Duration: 90 QT Interval:  361 QTC Calculation: 430 R Axis:   18 Text Interpretation: Sinus rhythm No significant change since last tracing Confirmed by Lajean Saver [phone]) on 03/13/2022 5:10:38 PM  Radiology No results found.  Procedures Procedures    Medications Ordered in ED Medications - No data to display  ED Course/ Medical Decision Making/ A&P                             Medical Decision Making Problems Addressed: Diverticula, colon: chronic illness or injury Hiatal hernia: chronic illness or injury Rectal bleeding: acute illness or injury with systemic symptoms that poses a threat to life or bodily functions Ventral hernia without obstruction or gangrene: chronic illness or injury  Amount and/or Complexity of Data Reviewed Independent Historian: EMS External Data Reviewed: notes. Labs: ordered. Decision-making details documented in ED Course. Radiology: ordered and independent interpretation performed. Decision-making details documented in ED Course.  Risk Decision regarding hospitalization.   Iv ns. Continuous pulse ox and cardiac monitoring. Labs ordered/sent.   Differential diagnosis includes  . Dispo decision including potential need for admission considered - will get labs and reassess.   Reviewed nursing notes and prior charts for additional history. External reports reviewed - pt with recent ed eval for similar symptoms including labs and imaging. Additional history from: EMS.  Cardiac monitor:  sinus rhythm, rate 90  Labs reviewed/interpreted by me - wbc normal. Hct 32 c/w prior.   Recent CT reviewed/interpreted by me - diverticula of colon.   Abd soft, no tenderness. Afebrile. Vitals stable. Hgb c/w prior. No active bleeding on rectal exam, scant light brown stool w tiny streak brb.   Pt indicates missed her gi appt this week but is rescheduled for coming week.    Rec close pcp/gi f/u.  Return precautions provided.            Final Clinical Impression(s) / ED Diagnoses Final diagnoses:  Rectal bleeding  Diverticula, colon    Rx / DC Orders ED Discharge Orders     None         Lajean Saver, MD 03/13/22 1737

## 2022-03-13 NOTE — ED Provider Triage Note (Signed)
Emergency Medicine Provider Triage Evaluation Note  [name] , a 88 y.o. female  was evaluated in triage.  Patient has a history of sigmoid and proctocolitis, which is likely ongoing.  Pt complains of "fresh rectal bleeding".  She was seen at [facility] ED on 1/29 and had a CT at that time showing continued inflammation.  She was given option for admission versus outpatient follow-up and she elected to follow-up as an outpatient.  She continues to have generalized abdominal pain.  No fevers.  No syncope.  CT 03/07/2022: IMPRESSION: 1. There is colonic diverticulosis of the sigmoid colon with wall thickening and stranding. Please correlate for any clinical evidence of diverticulitis. No complicating features. No obstruction, free air or fluid collections.  Review of Systems  Positive: Abdominal pain, rectal bleeding Negative: Fevers  Physical Exam  There were no vitals taken for this visit. Gen:   Awake, no distress   Resp:  Normal effort  MSK:   Moves extremities without difficulty  Other:  Abdomen soft with generally tender  Medical Decision Making  Medically screening exam initiated at 3:50 PM.  Appropriate orders placed.  ZANAI MALLARI was informed that the remainder of the evaluation will be completed by another provider, this initial triage assessment does not replace that evaluation, and the importance of remaining in the ED until their evaluation is complete.     Carlisle Cater, PA-C 03/13/22 1552

## 2022-03-16 ENCOUNTER — Ambulatory Visit (INDEPENDENT_AMBULATORY_CARE_PROVIDER_SITE_OTHER): Payer: 59 | Admitting: Gastroenterology

## 2022-03-16 ENCOUNTER — Ambulatory Visit: Payer: Self-pay | Admitting: Licensed Clinical Social Worker

## 2022-03-16 ENCOUNTER — Other Ambulatory Visit: Payer: 59

## 2022-03-16 ENCOUNTER — Encounter: Payer: Self-pay | Admitting: Gastroenterology

## 2022-03-16 VITALS — BP 128/78 | HR 85 | Ht 68.0 in

## 2022-03-16 DIAGNOSIS — R197 Diarrhea, unspecified: Secondary | ICD-10-CM

## 2022-03-16 DIAGNOSIS — K625 Hemorrhage of anus and rectum: Secondary | ICD-10-CM

## 2022-03-16 DIAGNOSIS — K6389 Other specified diseases of intestine: Secondary | ICD-10-CM | POA: Diagnosis not present

## 2022-03-16 NOTE — Patient Instructions (Signed)
Your provider has requested that you go to the basement level for lab work before leaving today. Press "B" on the elevator. The lab is located at the first door on the left as you exit the elevator.  Increase your Lialda to 4 pills daily.   The [place] [facility] providers would like to encourage you to use [facility] to communicate with providers for non-urgent requests or questions.  Due to long hold times on the telephone, sending your provider a message by [facility] may be a faster and more efficient way to get a response.  Please allow 48 business hours for a response.  Please remember that this is for non-urgent requests.   Due to recent changes in healthcare laws, you may see the results of your imaging and laboratory studies on MyChart before your provider has had a chance to review them.  We understand that in some cases there may be results that are confusing or concerning to you. Not all laboratory results come back in the same time frame and the provider may be waiting for multiple results in order to interpret others.  Please give [place] 48 hours in order for your provider to thoroughly review all the results before contacting the office for clarification of your results.

## 2022-03-16 NOTE — Progress Notes (Unsigned)
03/16/2022 [name] [id number] 1935-01-13   HISTORY OF PRESENT ILLNESS: This is an 88 year old female who is a patient of Dr. Woodward Ku with past medical history of chronic proctosigmoiditis, chronic anemia, large hiatal hernia, hypertension, COPD, hypothyroidism.  She was last seen on 12/16/2021 with one of our nurse practitioners.  At that point she just completed a course of prednisone and was doing well.  Please refer to that note for further details.  She is here today with her son for follow-up.  She is on Lialda 2.4 g daily.  At her visit in November she was placed on budesonide 9 mg daily.  She has continued on all of that and did well up until sometime in January.  She had a UTI in January and was given cephalexin.  Ever since then she has had diarrhea and rectal bleeding, sometimes with clots.  Her largest complaint today is of severe hip pain that she has been battling.  She was in the ED on 03/07/2022 and 2/24 for her GI symptoms.  Hemoglobin is stable at 10.2 g.  CRP elevated 2.3 and sed rate elevated at 49.  CT scan as below she was basically told to follow-up with GI.  CT scan abdomen and pelvis with contrast 03/07/2022:  IMPRESSION: 1. There is colonic diverticulosis of the sigmoid colon with wall thickening and stranding. Please correlate for any clinical evidence of diverticulitis. No complicating features. No obstruction, free air or fluid collections. 2. Large hiatal hernia. 3. Midline anterior abdominal wall hernia containing fat in the epigastric region. 4. Low-lying urinary bladder with funneling of the lower portion of the bladder. Please correlate with pelvic prolapse of the bladder.   Aortic Atherosclerosis (ICD10-I70.0).   10/21/21 Flexible sigmoidoscopy  -Stool throughout the colon  - Proctosigmoid colitis. Inflammation was found from the anus to the sigmoid colon. This was graded as Mayo Score 2 (moderate disease). Biopsied. - Non-bleeding non-thrombosed  internal hemorrhoids. Path:  A. COLON, LEFT SIDE, BIOPSY:  - Moderately active chronic proctocolitis, consistent with patient's  clinical history of ulcerative colitis  - Negative for granulomas or dysplasia    Past Medical History:  Diagnosis Date   ABDOMINAL PAIN, CHRONIC 05/28/2008   ALLERGIC RHINITIS 09/21/2006   ANEMIA-NOS 09/21/2006   ASTHMA 09/21/2006   ASTHMA 09/21/2006   ASYMPTOMATIC POSTMENOPAUSAL STATUS 05/28/2008   Cataract    CHEST PAIN, ATYPICAL 05/28/2008   COPD (chronic obstructive pulmonary disease) ([place])    COVID-19 virus infection 02/2020   DDD (degenerative disc disease)    DIVERTICULITIS, HX OF 09/21/2006   GERD 09/21/2006   HYPERCHOLESTEROLEMIA 06/05/2009   HYPERTENSION 09/21/2006   HYPOTHYROIDISM 09/21/2006   Peptic ulcer disease    Purpura ([place])    RENAL INSUFFICIENCY 09/21/2006   Shingles    Small bowel obstruction ([place])    Somatization disorder 04/25/2007   UNSPECIFIED URINARY CALCULUS 05/28/2008   UTI (urinary tract infection) 07/2019   UTI (urinary tract infection) 11/08/2021   UTI (urinary tract infection) 02/2022   Past Surgical History:  Procedure Laterality Date   ABDOMINAL HYSTERECTOMY  1981   acute Nephritis  [place]   BIOPSY  11/18/2019   Procedure: BIOPSY;  Surgeon: Ladene Artist, MD;  Location: [facility] ENDOSCOPY;  Service: Endoscopy;;   BIOPSY  10/21/2021   Procedure: BIOPSY;  Surgeon: Irving Copas., MD;  Location: [facility];  Service: Gastroenterology;;   [place] N/A 11/18/2019   Procedure: FLEXIBLE SIGMOIDOSCOPY;  Surgeon: Ladene Artist, MD;  Location: [facility];  Service: Endoscopy;  Laterality: N/A;   FLEXIBLE SIGMOIDOSCOPY N/A 10/21/2021   Procedure: FLEXIBLE SIGMOIDOSCOPY;  Surgeon: Rush Landmark Telford Nab., MD;  Location: [facility] ENDOS[facility];  Service: Gastroenterology;  Laterality: N/A;   NASAL  SINUS SURGERY  1967   Pulmonary thrombosis     TONSILLECTOMY     TUBAL LIGATION      reports that she has never smoked. She has never used smokeless tobacco. She reports that she does not drink alcohol and does not use drugs. family history includes Allergic rhinitis in her sister; Arthritis in her sister; Bladder Cancer in her father; Cirrhosis in her son; Hemochromatosis in her cousin and son; Hypertension in her father; Marfan syndrome in her son; Other in her brother and father; Pancreatic cancer in her mother; Polycystic kidney disease in her mother. Allergies  Allergen Reactions   Bolivia Nut Daisey Must) Skin Test Anaphylaxis   Fluconazole Shortness Of Breath   Metronidazole Shortness Of Breath and Nausea And Vomiting   Pepcid [Famotidine] Shortness Of Breath and Other (See Comments)    Dizziness   Shellfish Allergy Anaphylaxis   Macrobid [Nitrofurantoin]     REACTION: Syncope   Pantoprazole Other (See Comments)    CHEST PAIN      Outpatient Encounter Medications as of 03/16/2022  Medication Sig   acetaminophen (TYLENOL) 500 MG tablet Take 500 mg by mouth every 6 (six) hours as needed for moderate pain.   budesonide (ENTOCORT EC) 3 MG 24 hr capsule TAKE 3 CAPSULES (9 MG TOTAL) BY MOUTH DAILY.   Ferrous Sulfate (IRON) 325 (65 FE) MG TABS Take 1 tablet by mouth daily.    fluticasone (FLOVENT HFA) 220 MCG/ACT inhaler Inhale 1 puff into the lungs in the morning and at bedtime.   levothyroxine (SYNTHROID) 75 MCG tablet Take 1 tablet (75 mcg total) by mouth daily.   mesalamine (LIALDA) 1.2 g EC tablet Take by mouth daily with breakfast.   Spacer/Aero-Holding Chambers (E-Z SPACER) inhaler Use as instructed   cephALEXin (KEFLEX) 500 MG capsule Take 1 capsule (500 mg total) by mouth 3 (three) times daily. (Patient not taking: Reported on 03/16/2022)   [DISCONTINUED] mesalamine (ROWASA) 4 g enema Place 60 mLs (4 g total) rectally at bedtime. (Patient not taking: Reported on  03/16/2022)   No facility-administered encounter medications on file as of 03/16/2022.    REVIEW OF SYSTEMS  : All other systems reviewed and negative except where noted in the History of Present Illness.   PHYSICAL EXAM: BP 128/78   Pulse 85   Ht '5\' 8"'$  (1.727 m)   SpO2 97%   BMI 22.96 kg/m  General: Well developed white female in no acute distress; in a wheelchair Head: Normocephalic and atraumatic Eyes:  Sclerae anicteric, conjunctiva pink. Ears: Normal auditory acuity Lungs: Clear throughout to auscultation; no W/R/R. Heart: Regular rate and rhythm; no M/R/G. Abdomen: Soft, non-distended.  BS present.   Musculoskeletal: Symmetrical with no gross deformities  Skin: No lesions on visible extremities Extremities: No edema  Neurological: Alert oriented x 4, grossly non-focal Psychological:  Alert and cooperative. Normal mood and affect  ASSESSMENT AND PLAN: # 88 yo female with chronic proctocolitis on Lialda 2.4 g. Moderately active chronic disease on flexible sigmoidoscopy with biopsies September 2023 done for evaluation of rectal bleeding. She had resolution of symptoms with steroid  taper.  Unfortunately has had recurrence of diarrhea and rectal bleeding despite Lialda 2.4 g daily and budesonide 9 mg daily.  This seems to have followed a course of cephalexin for urinary tract infection. Will check stool for C. difficile and a fecal calprotectin..  Increase Lialda to 4.8 g daily for now. If C. difficile negative and fecal calprotectin is quite high then may need another treatment with prednisone, but if this continues to be a trend then may need escalation of therapy.  CC:  Farrel Conners, MD

## 2022-03-17 ENCOUNTER — Other Ambulatory Visit: Payer: 59

## 2022-03-17 DIAGNOSIS — K6389 Other specified diseases of intestine: Secondary | ICD-10-CM | POA: Diagnosis not present

## 2022-03-17 NOTE — Patient Outreach (Signed)
  Care Coordination   Follow Up Visit Note   03/16/22 Name: KEIRA BOHLIN MRN: [id number] DOB: [birth year]  SYESHA THAW is a 88 y.o. year old female who sees Farrel Conners, MD for primary care. I spoke with  Rea [employer]'s son by phone today.  What matters to the patients health and wellness today?  Caregiver Strain, Personal Care Services    Goals Addressed             This Visit's Progress    Obtain Supportive Services   On track    Care Coordination Interventions: Solution-Focused Strategies employed:  Active listening / Reflection utilized  Verbalization of feelings encouraged  LCSW spoke with pt's son Pt has been assessed at ED twice due to continued bleeding. Pt was encouraged to f/up with GI and discharged home both days  Family plans to attend GI appt scheduled today, 02/07 Son reviewed resources sent via email. Hoping to have a tx plan to address pain and bleeding Pt observed to be anxious about symptoms. She continues to walk in the home and try to stay independent as possible Discussed advocating for a tx plan and having a contact person at specialist clinics to discuss concerns vs ED Caregiver stress validated. Encouragement provided LCSW will confirm pt's Medicaid, and if appropriate, will have PCP complete PCS referral           SDOH assessments and interventions completed:  No     Care Coordination Interventions:  Yes, provided   Follow up plan: Follow up call scheduled for 2-4 weeks    Encounter Outcome:  Pt. Visit Completed   [name], MSW, [email or web address] Phone [phone] 9:16 AM

## 2022-03-17 NOTE — Patient Instructions (Signed)
Visit Information  Thank you for taking time to visit with me today. Please don't hesitate to contact me if I can be of assistance to you.   Following are the goals we discussed today:   Goals Addressed             This Visit's Progress    Obtain Supportive Services   On track    Care Coordination Interventions: Solution-Focused Strategies employed:  Active listening / Reflection utilized  Verbalization of feelings encouraged  LCSW spoke with pt's son Pt has been assessed at ED twice due to continued bleeding. Pt was encouraged to f/up with GI and discharged home both days  Family plans to attend GI appt scheduled today, 02/07 Son reviewed resources sent via email. Hoping to have a tx plan to address pain and bleeding Pt observed to be anxious about symptoms. She continues to walk in the home and try to stay independent as possible Discussed advocating for a tx plan and having a contact person at specialist clinics to discuss concerns vs ED Caregiver stress validated. Encouragement provided LCSW will confirm pt's Medicaid, and if appropriate, will have PCP complete PCS referral           Our next appointment is by telephone on 04/01/22 at 10 AM  Please call the care guide team at [phone] if you need to cancel or reschedule your appointment.   If you are experiencing a Mental Health or [place] or need someone to talk to, please call the Suicide and Crisis Lifeline: 988 call 911   Patient verbalizes understanding of instructions and care plan provided today and agrees to view in [place]. Active MyChart status and patient understanding of how to access instructions and care plan via MyChart confirmed with patient.     [name], MSW, [email or web address] Phone [phone] 9:17 AM

## 2022-03-19 LAB — CLOSTRIDIUM DIFFICILE BY PCR: Toxigenic C. Difficile by PCR: NEGATIVE

## 2022-03-22 ENCOUNTER — Other Ambulatory Visit: Payer: Self-pay

## 2022-03-22 LAB — CALPROTECTIN, FECAL: Calprotectin, Fecal: 2370 ug/g — ABNORMAL HIGH (ref 0–120)

## 2022-03-22 MED ORDER — PREDNISONE 10 MG PO TABS: ORAL_TABLET | ORAL | 0 refills | Status: AC

## 2022-03-28 ENCOUNTER — Telehealth: Payer: Self-pay

## 2022-03-28 NOTE — Progress Notes (Signed)
Care Management & Coordination Services Pharmacy Team  Reason for Encounter: COPD  Contacted patient to discuss COPD disease state. Spoke with patient on 03/28/2022   Current COPD regimen:  Flovent HFA 220 mcg/ACT  Any recent hospitalizations or ED visits since last visit with CPP? Yes, [facility] ED on 03/13/2026 (8 hours) due to rectal bleeding and Patient was seen at [facility] ED on 03/07/2022 (3 hour)  due to rectal bleeding.   Reports denies COPD symptoms  What recent interventions/DTPs have been made by any provider to improve breathing since last visit: No recent interventions  Have you had exacerbation/flare-up since last visit? Patient denies  What do you do when you are short of breath?  Patient states she will rest, she states she is well managed and it is very rare for her to have any SOB  Respiratory Devices/Equipment Do you have a nebulizer? Patient denies Do you use a Peak Flow Meter? Patient denies Do you use a maintenance inhaler? Patient denies How often do you forget to use your daily inhaler? Patient denies Do you use a rescue inhaler? Patient denies How often do you use your rescue inhaler?  Patient does not have a rescue inhaler Do you use a spacer with your inhaler? Yes  Adherence Review: Does the patient have >5 day gap between last estimated fill date for maintenance inhaler medications? Yes, verified with [facility] Flovent HFA last fill date was 12/29/2021 30 DS   Chart Review:  Recent office visits:  03/01/2022 Colin Benton DO - Patient was seen for dysuria. Started Cephalexin 500 mg tid.   02/10/2022 [name] MD - Patient was seen for Paresthesia of bilateral legs and additional concerns. Changed Mesalamine to 4 g rectal daily at bedtime  Recent consult visits:  03/16/2022 Alonza Bogus PA-C (GI) - Patient was seen for Proctosigmoiditis and additional concern. Changed Mesalamine to 1.2 Tbec oral daily with breakfast.   01/19/2022 Zola Button MD (oncology) - Patient was seen for symptomatic anemia. No medication changes.  Hospital visits:  [facility] ED on 03/13/2026 (8 hours) due to rectal bleeding.    New?Medications Started at [facility] Discharge:?? No medication started Medication Changes at Hospital Discharge: No medication changes Medications Discontinued at Hospital Discharge: No medications discontinued Medications that remain the same after Hospital Discharge:??  -All other medications will remain the same.     Patient was seen at [facility] ED on 03/07/2022 (3 hour)  due to rectal bleeding.    New?Medications Started at [facility] Discharge:?? No medication changes Medication Changes at Hospital Discharge: No medication changes Medications Discontinued at Hospital Discharge: No medications discontinued Medications that remain the same after Hospital Discharge:??  -All other medications will remain the same.    Care Gaps: AWV - scheduled 01/27/2023 Next appointment - 07/04/2022 Covid - never done Shingrix - never done Flu - postponed Pneumovax - postponed  Star Rating Drug: None  Medications: Outpatient Encounter Medications as of 03/28/2022  Medication Sig   acetaminophen (TYLENOL) 500 MG tablet Take 500 mg by mouth every 6 (six) hours as needed for moderate pain.   budesonide (ENTOCORT EC) 3 MG 24 hr capsule TAKE 3 CAPSULES (9 MG TOTAL) BY MOUTH DAILY.   cephALEXin (KEFLEX) 500 MG capsule Take 1 capsule (500 mg total) by mouth 3 (three) times daily. (Patient not taking: Reported on 03/16/2022)   Ferrous Sulfate (IRON) 325 (65 FE) MG TABS Take 1 tablet by mouth daily.    fluticasone (FLOVENT HFA) 220  MCG/ACT inhaler Inhale 1 puff into the lungs in the morning and at bedtime.   levothyroxine (SYNTHROID) 75 MCG tablet Take 1 tablet (75 mcg total) by mouth daily.   mesalamine (LIALDA) 1.2 g EC tablet Take by mouth daily with breakfast.   predniSONE (DELTASONE) 10 MG tablet Take 4 tablets (40  mg total) by mouth daily with breakfast for 14 days, THEN 3.5 tablets (35 mg total) daily with breakfast for 7 days, THEN 3 tablets (30 mg total) daily with breakfast for 7 days, THEN 2.5 tablets (25 mg total) daily with breakfast for 7 days, THEN 2 tablets (20 mg total) daily with breakfast for 7 days, THEN 1.5 tablets (15 mg total) daily with breakfast for 7 days, THEN 1 tablet (10 mg total) daily with breakfast for 7 days, THEN 0.5 tablets (5 mg total) daily with breakfast for 7 days.   Spacer/Aero-Holding Chambers (E-Z SPACER) inhaler Use as instructed   No facility-administered encounter medications on file as of 03/28/2022.    Zachary Pharmacist Assistant [phone]

## 2022-03-31 ENCOUNTER — Ambulatory Visit (INDEPENDENT_AMBULATORY_CARE_PROVIDER_SITE_OTHER): Payer: 59 | Admitting: Neurology

## 2022-03-31 ENCOUNTER — Encounter: Payer: Self-pay | Admitting: Neurology

## 2022-03-31 VITALS — BP 150/72 | HR 83 | Ht 68.0 in | Wt 153.0 lb

## 2022-03-31 DIAGNOSIS — R202 Paresthesia of skin: Secondary | ICD-10-CM | POA: Diagnosis not present

## 2022-03-31 DIAGNOSIS — R748 Abnormal levels of other serum enzymes: Secondary | ICD-10-CM | POA: Diagnosis not present

## 2022-03-31 DIAGNOSIS — R7309 Other abnormal glucose: Secondary | ICD-10-CM | POA: Diagnosis not present

## 2022-03-31 DIAGNOSIS — R52 Pain, unspecified: Secondary | ICD-10-CM

## 2022-03-31 DIAGNOSIS — R799 Abnormal finding of blood chemistry, unspecified: Secondary | ICD-10-CM | POA: Diagnosis not present

## 2022-03-31 MED ORDER — DULOXETINE HCL 20 MG PO CPEP: 20.0000 mg | ORAL_CAPSULE | Freq: Every day | ORAL | 6 refills | Status: DC

## 2022-03-31 NOTE — Progress Notes (Signed)
Chief Complaint  Patient presents with   New Patient (Initial Visit)    Rm 15 internal referral for paresthesia of bilateral legs C/o pain, numbness and tingling, states pain has worsen within the last few months       ASSESSMENT AND PLAN  [name] is a 88 y.o. female   Diffuse body achy pain, Bilateral feet lower extremity pain,  In the setting of multiple medical condition, deconditioning,  Already receiving prednisone treatment,  CPK to rule out intrinsic muscle disease.   Cymbalta 20 mg daily    DIAGNOSTIC DATA (LABS, IMAGING, TESTING) - I reviewed patient records, labs, notes, testing and imaging myself where available.   MEDICAL HISTORY:  [name] is a 88 year old female, accompanied by her son, seen in request by her primary care physician Dr. Farrel Conners, for evaluation of diffuse body achy pain, bilateral lower extremity burning pain, hope to get some relief  I reviewed and summarized the referring note. PMHX Asthma Hypothyroidism Long use of steroid,   Patient recently suffered flareup of GI bleeding, I reviewed most recent GI visit from March 16, 2022, chronic proctosigmoiditis, chronic anemia, large hiatal hernia, hypertension, COPD,  She had a flareup in November 2023, well-controlled by prednisone, taking budesonide 9 mg daily, then in January 2024, she suffered a UTI, was giving antibiotic cephalexin, ever since then, she had recurrent bloody diarrhea, to the point of blood clot, also complains of severe body achy pain,  ER visit end of January and early February, hemoglobin was stable at 10.2, C-reactive protein and ESR was elevated, 2.3/49,  CT of abdomen showed colonic diverticulosis, of the sigmoid colon, with wall thickening and stranding, large hiatal hernia, pelvic prolapse of the bladder  Flexible sigmoidoscopy on October 21, 2021, proctosigmoid colitis, inflammation was found from the anus to the sigmoid colon, biopsy showed  moderately active chronic proctitis, consistent with her history of ulcerative colitis,  Throughout her life, she has been treated with variable dose of prednisone for her asthma, frequent flareup and also her GI issues  Now she is on 40 mg prednisone tapering down, mesalamine 4.8 g daily,  She complains of long history of diffuse body achy pain multiple joints pain, including chronic neck, low back whole spine pain, around 2020, began to notice bilateral foot burning pain, symmetric, often centered at the bottom and top of her foot, sometimes ascending to knee level, difficulty bearing weight, cannot tolerate touch,  Previously tried gabapentin, complains worsening symptoms, not taking Tylenol as needed, only help mild temporarily  She lives in senior apartment, able to ambulate with a cane, but sedentary lifestyle, sleep 5 to 7 hours each night, sleeping time is her best time, pain-free during her sleep  PHYSICAL EXAM:   Vitals:   03/31/22 1524  BP: (!) 150/72  Pulse: 83  Weight: 153 lb (69.4 kg)  Height: 5' 8"$  (1.727 m)      Body mass index is 23.26 kg/m.  PHYSICAL EXAMNIATION:  Gen: NAD, conversant, well nourised, well groomed                     Cardiovascular: Regular rate rhythm, no peripheral edema, warm, nontender. Eyes: Conjunctivae clear without exudates or hemorrhage Neck: Supple, no carotid bruits. Pulmonary: Clear to auscultation bilaterally   NEUROLOGICAL EXAM:  MENTAL STATUS: Speech/cognition: Awake, alert, oriented to history taking and casual conversation CRANIAL NERVES: CN II: Visual fields are full to confrontation. Pupils are round equal and briskly  reactive to light. CN III, IV, VI: extraocular movement are normal. No ptosis. CN V: Facial sensation is intact to light touch CN VII: Face is symmetric with normal eye closure  CN VIII: Hearing is normal to causal conversation. CN IX, X: Phonation is normal. CN XI: Head turning and shoulder shrug are  intact  MOTOR: There is no pronator drift of out-stretched arms. Muscle bulk and tone are normal. Muscle strength is normal.  REFLEXES: Reflexes are 1  and symmetric at the biceps, triceps, knees, and ankles. Plantar responses are flexor.  SENSORY: diffuse body achy pain on palpitation  COORDINATION: There is no trunk or limb dysmetria noted.  GAIT/STANCE: relies on her cane, slowly, unsteady.  REVIEW OF SYSTEMS:  Full 14 system review of systems performed and notable only for as above All other review of systems were negative.   ALLERGIES: Allergies  Allergen Reactions   [place] Nut Daisey Must) Skin Test Anaphylaxis   Fluconazole Shortness Of Breath   Metronidazole Shortness Of Breath and Nausea And Vomiting   Pepcid [Famotidine] Shortness Of Breath and Other (See Comments)    Dizziness   Shellfish Allergy Anaphylaxis   Macrobid [Nitrofurantoin]     REACTION: Syncope   Pantoprazole Other (See Comments)    CHEST PAIN    HOME MEDICATIONS: Current Outpatient Medications  Medication Sig Dispense Refill   acetaminophen (TYLENOL) 500 MG tablet Take 500 mg by mouth every 6 (six) hours as needed for moderate pain.     Ferrous Sulfate (IRON) 325 (65 FE) MG TABS Take 1 tablet by mouth daily.      fluticasone (FLOVENT HFA) 220 MCG/ACT inhaler Inhale 1 puff into the lungs in the morning and at bedtime. 1 each 5   levothyroxine (SYNTHROID) 75 MCG tablet Take 1 tablet (75 mcg total) by mouth daily. 90 tablet 1   mesalamine (LIALDA) 1.2 g EC tablet Take by mouth daily with breakfast.     predniSONE (DELTASONE) 10 MG tablet Take 4 tablets (40 mg total) by mouth daily with breakfast for 14 days, THEN 3.5 tablets (35 mg total) daily with breakfast for 7 days, THEN 3 tablets (30 mg total) daily with breakfast for 7 days, THEN 2.5 tablets (25 mg total) daily with breakfast for 7 days, THEN 2 tablets (20 mg total) daily with breakfast for 7 days, THEN 1.5 tablets (15 mg total) daily  with breakfast for 7 days, THEN 1 tablet (10 mg total) daily with breakfast for 7 days, THEN 0.5 tablets (5 mg total) daily with breakfast for 7 days. 175 tablet 0   Spacer/Aero-Holding Chambers (E-Z SPACER) inhaler Use as instructed 1 each 2   No current facility-administered medications for this visit.    PAST MEDICAL HISTORY: Past Medical History:  Diagnosis Date   ABDOMINAL PAIN, CHRONIC 05/28/2008   ALLERGIC RHINITIS 09/21/2006   ANEMIA-NOS 09/21/2006   ASTHMA 09/21/2006   ASTHMA 09/21/2006   ASYMPTOMATIC POSTMENOPAUSAL STATUS 05/28/2008   Cataract    CHEST PAIN, ATYPICAL 05/28/2008   COPD (chronic obstructive pulmonary disease) ([place])    COVID-19 virus infection 02/2020   DDD (degenerative disc disease)    DIVERTICULITIS, HX OF 09/21/2006   GERD 09/21/2006   HYPERCHOLESTEROLEMIA 06/05/2009   HYPERTENSION 09/21/2006   HYPOTHYROIDISM 09/21/2006   Peptic ulcer disease    Purpura ([place])    RENAL INSUFFICIENCY 09/21/2006   Shingles    Small bowel obstruction (HCC)    Somatization disorder 04/25/2007   UNSPECIFIED URINARY CALCULUS 05/28/2008   UTI (urinary  tract infection) 07/2019   UTI (urinary tract infection) 11/08/2021   UTI (urinary tract infection) 02/2022    PAST SURGICAL HISTORY: Past Surgical History:  Procedure Laterality Date   ABDOMINAL HYSTERECTOMY  1981   acute Nephritis  [place]   BIOPSY  11/18/2019   Procedure: BIOPSY;  Surgeon: Ladene Artist, MD;  Location: [facility] END[facility];  Service: Endoscopy;;   BIOPSY  10/21/2021   Procedure: BIOPSY;  Surgeon: Irving Copas., MD;  Location: [facility];  Service: Gastroenterology;;   [place] N/A 11/18/2019   Procedure: FLEXIBLE SIGMOIDOSCOPY;  Surgeon: Ladene Artist, MD;  Location: [facility];  Service: Endoscopy;  Laterality: N/A;   FLEXIBLE SIGMOIDOSCOPY N/A 10/21/2021    Procedure: FLEXIBLE SIGMOIDOSCOPY;  Surgeon: Rush Landmark Telford Nab., MD;  Location: [facility];  Service: Gastroenterology;  Laterality: N/A;   NASAL SINUS SURGERY  1967   Pulmonary thrombosis     TONSILLECTOMY     TUBAL LIGATION      FAMILY HISTORY: Family History  Problem Relation Age of Onset   Polycystic kidney disease Mother    Pancreatic cancer Mother    Other Father        Schamberg disease   Bladder Cancer Father    Hypertension Father    Marfan syndrome Son    Hemochromatosis Son    Cirrhosis Son    Allergic rhinitis Sister    Other Brother        bone issue as child; multiple fractures but seemed to age out of this   Hemochromatosis Cousin    Arthritis Sister    Colon cancer Neg Hx    Esophageal cancer Neg Hx    Stomach cancer Neg Hx     SOCIAL HISTORY: Social History   Socioeconomic History   Marital status: Divorced    Spouse name: Not on file   Number of children: 3   Years of education: Not on file   Highest education level: Not on file  Occupational History   Occupation: retired  Tobacco Use   Smoking status: Never   Smokeless tobacco: Never  Vaping Use   Vaping Use: Never used  Substance and Sexual Activity   Alcohol use: No   Drug use: No   Sexual activity: Not Currently  Other Topics Concern   Not on file  Social History Narrative   Not on file   Social Determinants of Health   Financial Resource Strain: Low Risk  (01/24/2022)   Overall Financial Resource Strain (CARDIA)    Difficulty of Paying Living Expenses: Not hard at all  Food Insecurity: No Food Insecurity (01/24/2022)   Hunger Vital Sign    Worried About Running Out of Food in the Last Year: Never true    Ran Out of Food in the Last Year: Never true  Transportation Needs: No Transportation Needs (01/24/2022)   PRAPARE - Hydrologist (Medical): No    Lack of Transportation (Non-Medical): No  Physical Activity: Inactive (01/24/2022)   Exercise  Vital Sign    Days of Exercise per Week: 0 days    Minutes of Exercise per Session: 0 min  Stress: No Stress Concern Present (01/24/2022)   [place]    Feeling of Stress : Not at all  Social Connections: Moderately Integrated (  01/24/2022)   Social Connection and Isolation Panel [NHANES]    Frequency of Communication with Friends and Family: More than three times a week    Frequency of Social Gatherings with Friends and Family: More than three times a week    Attends Religious Services: More than 4 times per year    Active Member of [employer] or Organizations: Yes    Attends Archivist Meetings: More than 4 times per year    Marital Status: Divorced  Intimate Partner Violence: Not At Risk (01/24/2022)   Humiliation, Afraid, Rape, and Kick questionnaire    Fear of Current or Ex-Partner: No    Emotionally Abused: No    Physically Abused: No    Sexually Abused: No      Marcial Pacas, M.D. Ph.D.  [facility] [address] [place], [place] [address] Ph: [phone] Fax: [phone]  CC:  Farrel Conners, MD [place],  [place] [address]  Farrel Conners, MD

## 2022-04-01 ENCOUNTER — Ambulatory Visit: Payer: Self-pay | Admitting: Licensed Clinical Social Worker

## 2022-04-04 NOTE — Patient Outreach (Signed)
  Care Coordination   Follow Up Visit Note   Encounter completed on 04/01/22 Name: [name] MRN: [id number] DOB: 06-Dec-1934  [name] is a 88 y.o. year old female who sees Farrel Conners, MD for primary care. I spoke with  Deberah Castle by phone today.  What matters to the patients health and wellness today?  Supportive Resources, Personal Care Services     Goals Addressed             This Visit's Progress    Obtain Supportive Services   On track    Care Coordination Interventions: Solution-Focused Strategies employed:  Active listening / Reflection utilized  Verbalization of feelings encouraged  LCSW spoke with pt's son GI adjusted meds. Bleeding symptoms have decreased. Pt was prescribed Cymbalta and son picked up medication Pt's priority is to assist with pain management which limits mobility Pt prefers to wait on PT, currently. Will re-assess in near future Has anxiety about being home alone, may benefit from [facility] services to assist with ADLs shower, walking, etc. LCSW will confirm [place] benefits to assess if pt is eligible for aid services Caregiver stress validated. Encouragement provided           SDOH assessments and interventions completed:  No     Care Coordination Interventions:  Yes, provided   Follow up plan: Follow up call scheduled for 1-2 weeks    Encounter Outcome:  Pt. Visit Completed   [name], MSW, [email or web address] Phone [phone] 5:51 PM

## 2022-04-04 NOTE — Patient Instructions (Signed)
Visit Information  Thank you for taking time to visit with me today. Please don't hesitate to contact me if I can be of assistance to you.   Following are the goals we discussed today:   Goals Addressed             This Visit's Progress    Obtain Supportive Services   On track    Care Coordination Interventions: Solution-Focused Strategies employed:  Active listening / Reflection utilized  Verbalization of feelings encouraged  LCSW spoke with pt's son GI adjusted meds. Bleeding symptoms have decreased. Pt was prescribed Cymbalta and son picked up medication Pt's priority is to assist with pain management which limits mobility Pt prefers to wait on PT, currently. Will re-assess in near future Has anxiety about being home alone, may benefit from [facility] services to assist with ADLs shower, walking, etc. LCSW will confirm [place] benefits to assess if pt is eligible for aid services Caregiver stress validated. Encouragement provided           Our next appointment is by telephone on 04/12/22 at 1 PM  Please call the care guide team at [phone] if you need to cancel or reschedule your appointment.   If you are experiencing a Mental Health or [place] or need someone to talk to, please call the Suicide and Crisis Lifeline: 988 call 911   Patient verbalizes understanding of instructions and care plan provided today and agrees to view in [place]. Active MyChart status and patient understanding of how to access instructions and care plan via MyChart confirmed with patient.     [name], MSW, [email or web address] Phone [phone] 5:52 PM

## 2022-04-05 ENCOUNTER — Telehealth: Payer: Self-pay | Admitting: Family Medicine

## 2022-04-05 ENCOUNTER — Telehealth: Payer: Self-pay | Admitting: Licensed Clinical Social Worker

## 2022-04-05 DIAGNOSIS — R053 Chronic cough: Secondary | ICD-10-CM

## 2022-04-05 MED ORDER — QVAR REDIHALER 40 MCG/ACT IN AERB: 1.0000 | INHALATION_SPRAY | Freq: Two times a day (BID) | RESPIRATORY_TRACT | 5 refills | Status: DC

## 2022-04-05 NOTE — Telephone Encounter (Signed)
Noted  

## 2022-04-05 NOTE — Telephone Encounter (Signed)
[name] from [facility] call and stated the company for Flovent don't make it anymore and stated these are the replacements arnuity and qvar .[name]'s # is [phone].

## 2022-04-05 NOTE — Patient Outreach (Signed)
  Care Coordination   Follow Up Visit Note   04/05/2022 Name: PAMALA HITSON MRN: [id number] DOB: 1934/04/26  NYIA CORNIA is a 88 y.o. year old female who sees Farrel Conners, MD for primary care. I  collaborated with [place] Medicaid and PCP office  What matters to the patients health and wellness today?  Patient was not engaged during this encounter    Goals Addressed             This Visit's Progress    Obtain Supportive Services   On track    Activities and task to complete in order to accomplish goals.   LCSW collaborated with Deneise Lever with [place] Medicaid, who confirmed pt eligibility for [place] [phone] LCSW submitted Personal Care Services Request Form to PCP office via fax             SDOH assessments and interventions completed:  No     Care Coordination Interventions:  Yes, provided  Interventions Today    Flowsheet Row Most Recent Value  Chronic Disease   Chronic disease during today's visit Hypertension (HTN), Chronic Obstructive Pulmonary Disease (COPD)  General Interventions   General Interventions Discussed/Reviewed Level of Care, Communication with  Communication with --  [Medicaid Eligibility Office]  Level of Care Personal Care Services       Follow up plan: Follow up call scheduled for 1-2 weeks    Encounter Outcome:  Pt. Visit Completed   [name], MSW, [email or web address] Phone [phone] 5:19 PM

## 2022-04-05 NOTE — Patient Instructions (Signed)
Visit Information  Thank you for taking time to visit with me today. Please don't hesitate to contact me if I can be of assistance to you.   Following are the goals we discussed today:   Goals Addressed             This Visit's Progress    Obtain Supportive Services   On track    Activities and task to complete in order to accomplish goals.   LCSW collaborated with Deneise Lever with [place] Medicaid, who confirmed pt eligibility for [place] [phone] LCSW submitted Personal Care Services Request Form to PCP office via fax             Our next appointment is by telephone on 04/12/22 at 1 PM  Please call the care guide team at [phone] if you need to cancel or reschedule your appointment.   If you are experiencing a Mental Health or [place] or need someone to talk to, please call the Suicide and Crisis Lifeline: 988 call 911   Patient verbalizes understanding of instructions and care plan provided today and agrees to view in [place]. Active MyChart status and patient understanding of how to access instructions and care plan via MyChart confirmed with patient.     [name], MSW, [email or web address] Phone [phone] 5:21 PM

## 2022-04-06 LAB — MULTIPLE MYELOMA PANEL, SERUM
Albumin SerPl Elph-Mcnc: 3.5 g/dL (ref 2.9–4.4)
Albumin/Glob SerPl: 1.3 (ref 0.7–1.7)
Alpha 1: 0.3 g/dL (ref 0.0–0.4)
Alpha2 Glob SerPl Elph-Mcnc: 0.7 g/dL (ref 0.4–1.0)
B-Globulin SerPl Elph-Mcnc: 0.8 g/dL (ref 0.7–1.3)
Gamma Glob SerPl Elph-Mcnc: 0.9 g/dL (ref 0.4–1.8)
Globulin, Total: 2.7 g/dL (ref 2.2–3.9)
IgA/Immunoglobulin A, Serum: 391 mg/dL (ref 64–422)
IgG (Immunoglobin G), Serum: 800 mg/dL (ref 586–1602)
IgM (Immunoglobulin M), Srm: 31 mg/dL (ref 26–217)
Total Protein: 6.2 g/dL (ref 6.0–8.5)

## 2022-04-06 LAB — HEMOGLOBIN A1C
Est. average glucose Bld gHb Est-mCnc: 111 mg/dL
Hgb A1c MFr Bld: 5.5 % (ref 4.8–5.6)

## 2022-04-06 LAB — CK: Total CK: 33 U/L (ref 26–161)

## 2022-04-10 ENCOUNTER — Emergency Department (HOSPITAL_COMMUNITY): Payer: 59

## 2022-04-10 ENCOUNTER — Encounter (HOSPITAL_COMMUNITY): Payer: Self-pay

## 2022-04-10 ENCOUNTER — Emergency Department (HOSPITAL_COMMUNITY)
Admission: EM | Admit: 2022-04-10 | Discharge: 2022-04-10 | Disposition: A | Discharge status: Home / Self Care | Payer: 59 | Attending: Emergency Medicine | Admitting: Emergency Medicine

## 2022-04-10 DIAGNOSIS — D72829 Elevated white blood cell count, unspecified: Secondary | ICD-10-CM | POA: Insufficient documentation

## 2022-04-10 DIAGNOSIS — Z8616 Personal history of COVID-19: Secondary | ICD-10-CM | POA: Insufficient documentation

## 2022-04-10 DIAGNOSIS — R0682 Tachypnea, not elsewhere classified: Secondary | ICD-10-CM | POA: Insufficient documentation

## 2022-04-10 DIAGNOSIS — R509 Fever, unspecified: Secondary | ICD-10-CM | POA: Diagnosis not present

## 2022-04-10 DIAGNOSIS — D649 Anemia, unspecified: Secondary | ICD-10-CM | POA: Insufficient documentation

## 2022-04-10 DIAGNOSIS — Z743 Need for continuous supervision: Secondary | ICD-10-CM | POA: Diagnosis not present

## 2022-04-10 DIAGNOSIS — R000 Tachycardia, unspecified: Secondary | ICD-10-CM | POA: Insufficient documentation

## 2022-04-10 DIAGNOSIS — R682 Dry mouth, unspecified: Secondary | ICD-10-CM | POA: Insufficient documentation

## 2022-04-10 DIAGNOSIS — K449 Diaphragmatic hernia without obstruction or gangrene: Secondary | ICD-10-CM | POA: Diagnosis not present

## 2022-04-10 DIAGNOSIS — I959 Hypotension, unspecified: Secondary | ICD-10-CM | POA: Diagnosis not present

## 2022-04-10 DIAGNOSIS — Z87440 Personal history of urinary (tract) infections: Secondary | ICD-10-CM | POA: Diagnosis not present

## 2022-04-10 DIAGNOSIS — U071 COVID-19: Secondary | ICD-10-CM | POA: Diagnosis not present

## 2022-04-10 DIAGNOSIS — Z66 Do not resuscitate: Secondary | ICD-10-CM | POA: Insufficient documentation

## 2022-04-10 DIAGNOSIS — A419 Sepsis, unspecified organism: Secondary | ICD-10-CM | POA: Diagnosis not present

## 2022-04-10 DIAGNOSIS — E889 Metabolic disorder, unspecified: Secondary | ICD-10-CM | POA: Diagnosis not present

## 2022-04-10 LAB — COMPREHENSIVE METABOLIC PANEL
ALT: 30 U/L (ref 0–44)
AST: 36 U/L (ref 15–41)
Albumin: 2.4 g/dL — ABNORMAL LOW (ref 3.5–5.0)
Alkaline Phosphatase: 51 U/L (ref 38–126)
Anion gap: 8 (ref 5–15)
BUN: 35 mg/dL — ABNORMAL HIGH (ref 8–23)
CO2: 22 mmol/L (ref 22–32)
Calcium: 8.4 mg/dL — ABNORMAL LOW (ref 8.9–10.3)
Chloride: 99 mmol/L (ref 98–111)
Creatinine, Ser: 0.75 mg/dL (ref 0.44–1.00)
GFR, Estimated: >60 mL/min (ref >60)
Glucose, Bld: 126 mg/dL — ABNORMAL HIGH (ref 70–99)
Potassium: 3.5 mmol/L (ref 3.5–5.1)
Sodium: 129 mmol/L — ABNORMAL LOW (ref 135–145)
Total Bilirubin: 0.6 mg/dL (ref 0.3–1.2)
Total Protein: 5.8 g/dL — ABNORMAL LOW (ref 6.5–8.1)

## 2022-04-10 LAB — CBC WITH DIFFERENTIAL/PLATELET
Abs Immature Granulocytes: 0.3 10*3/uL — ABNORMAL HIGH (ref 0.00–0.07)
Basophils Absolute: 0 10*3/uL (ref 0.0–0.1)
Basophils Relative: 0 %
Eosinophils Absolute: 0 10*3/uL (ref 0.0–0.5)
Eosinophils Relative: 0 %
HCT: 27.4 % — ABNORMAL LOW (ref 36.0–46.0)
Hemoglobin: 8.9 g/dL — ABNORMAL LOW (ref 12.0–15.0)
Immature Granulocytes: 2 %
Lymphocytes Relative: 5 %
Lymphs Abs: 1 10*3/uL (ref 0.7–4.0)
MCH: 30.6 pg (ref 26.0–34.0)
MCHC: 32.5 g/dL (ref 30.0–36.0)
MCV: 94.2 fL (ref 80.0–100.0)
Monocytes Absolute: 0.9 10*3/uL (ref 0.1–1.0)
Monocytes Relative: 5 %
Neutro Abs: 17.6 10*3/uL — ABNORMAL HIGH (ref 1.7–7.7)
Neutrophils Relative %: 88 %
Platelets: 323 10*3/uL (ref 150–400)
RBC: 2.91 MIL/uL — ABNORMAL LOW (ref 3.87–5.11)
RDW: 14.3 % (ref 11.5–15.5)
WBC: 19.9 10*3/uL — ABNORMAL HIGH (ref 4.0–10.5)
nRBC: 0 % (ref 0.0–0.2)

## 2022-04-10 LAB — PROTIME-INR
INR: 1.1 (ref 0.8–1.2)
Prothrombin Time: 14.4 seconds (ref 11.4–15.2)

## 2022-04-10 LAB — LACTIC ACID, PLASMA: Lactic Acid, Venous: 2.9 mmol/L (ref 0.5–1.9)

## 2022-04-10 MED ORDER — ACETAMINOPHEN 325 MG PO TABS: 650.0000 mg | ORAL_TABLET | Freq: Once | ORAL | Status: DC

## 2022-04-10 NOTE — ED Provider Notes (Signed)
[facility] Provider Note   CSN: [id number] Arrival date & time: 04/10/22  1344     History  Chief Complaint  Patient presents with   Fever    [name] is a 88 y.o. female presented with generalized pain otherwise.  Patient came in via EMS from her senior apartment.  Her son called EMS after he noticed this morning that she was lying in her own urine and seemed altered.  Patient states that she had UTI 2 weeks ago and did not finish the Keflex as it made her stomach upset.  Patient arrived with a fever and was given Tylenol by EMS.  Patient is a DNR and states she does not wish to give blood for labs or do imaging or receive any antibiotics and wants to be discharged with comfort measures as she is tired of being in pain.  Home Medications Prior to Admission medications   Medication Sig Start Date End Date Taking? Authorizing Provider  acetaminophen (TYLENOL) 500 MG tablet Take 500 mg by mouth every 6 (six) hours as needed for moderate pain.    [provider]  beclomethasone (QVAR REDIHALER) 40 MCG/ACT inhaler Inhale 1 puff into the lungs 2 (two) times daily. 04/05/22   Farrel Conners, MD  DULoxetine (CYMBALTA) 20 MG capsule Take 1 capsule (20 mg total) by mouth daily. 03/31/22   Marcial Pacas, MD  Ferrous Sulfate (IRON) 325 (65 FE) MG TABS Take 1 tablet by mouth daily.     [provider]  levothyroxine (SYNTHROID) 75 MCG tablet Take 1 tablet (75 mcg total) by mouth daily. 01/13/22   Farrel Conners, MD  mesalamine (LIALDA) 1.2 g EC tablet Take by mouth daily with breakfast.    [provider]  predniSONE (DELTASONE) 10 MG tablet Take 4 tablets (40 mg total) by mouth daily with breakfast for 14 days, THEN 3.5 tablets (35 mg total) daily with breakfast for 7 days, THEN 3 tablets (30 mg total) daily with breakfast for 7 days, THEN 2.5 tablets (25 mg total) daily with breakfast for 7 days, THEN 2 tablets (20 mg  total) daily with breakfast for 7 days, THEN 1.5 tablets (15 mg total) daily with breakfast for 7 days, THEN 1 tablet (10 mg total) daily with breakfast for 7 days, THEN 0.5 tablets (5 mg total) daily with breakfast for 7 days. 03/22/22 05/24/22  Zehr, Laban Emperor, PA-C  Spacer/Aero-Holding Chambers (E-Z SPACER) inhaler Use as instructed 11/08/17   Caren Macadam, MD      Allergies    [place] nut (berthollefia excelsa) skin test, Fluconazole, Metronidazole, Pepcid [famotidine], Shellfish allergy, Macrobid [nitrofurantoin], and Pantoprazole    Review of Systems   Review of Systems  Constitutional:  Positive for fever.  [name] HPI  Physical Exam Updated Vital Signs BP (!) 100/53   Pulse 96   Temp (!) 101.2 F (38.4 C) (Oral)   Resp (!) 22   Ht '5\' 8"'$  (1.727 m)   Wt 69.4 kg   SpO2 96%   BMI 23.26 kg/m  Physical Exam Constitutional:      Appearance: She is ill-appearing.     Comments: Appears in pain and febrile  HENT:     Mouth/Throat:     Mouth: Mucous membranes are dry.  Eyes:     Extraocular Movements: Extraocular movements intact.     Conjunctiva/sclera: Conjunctivae normal.     Pupils: Pupils are equal, round, and reactive to light.  Cardiovascular:     Rate and Rhythm: Regular rhythm. Tachycardia present.     Pulses: Normal pulses.     Heart sounds: Normal heart sounds.     Comments: 2+ bilateral radial/dorsalis pedis pulses with increased rate Pulmonary:     Effort: Pulmonary effort is normal. No respiratory distress.     Breath sounds: Normal breath sounds.     Comments: Tachypnea Musculoskeletal:        General: Normal range of motion.     Cervical back: Normal range of motion and neck supple.  Skin:    General: Skin is warm and dry.     Capillary Refill: Capillary refill takes less than 2 seconds.  Neurological:     General: No focal deficit present.     Mental Status: She is alert and oriented to person, place, and time.  Psychiatric:        Mood and Affect:  Mood normal.     Comments: Has decision-making capacity     ED Results / Procedures / Treatments   Labs (all labs ordered are listed, but only abnormal results are displayed) Labs Reviewed  COMPREHENSIVE METABOLIC PANEL - Abnormal; Notable for the following components:      Result Value   Sodium 129 (*)    Glucose, Bld 126 (*)    BUN 35 (*)    Calcium 8.4 (*)    Total Protein 5.8 (*)    Albumin 2.4 (*)    All other components within normal limits  LACTIC ACID, PLASMA - Abnormal; Notable for the following components:   Lactic Acid, Venous 2.9 (*)    All other components within normal limits  CBC WITH DIFFERENTIAL/PLATELET - Abnormal; Notable for the following components:   WBC 19.9 (*)    RBC 2.91 (*)    Hemoglobin 8.9 (*)    HCT 27.4 (*)    Neutro Abs 17.6 (*)    Abs Immature Granulocytes 0.30 (*)    All other components within normal limits  CULTURE, BLOOD (ROUTINE X 2)  CULTURE, BLOOD (ROUTINE X 2)  PROTIME-INR  LACTIC ACID, PLASMA  URINALYSIS, ROUTINE W REFLEX MICROSCOPIC    EKG None  Radiology DG Chest 2 View  Result Date: 04/10/2022 CLINICAL DATA:  Sepsis.  Fever and chills.  Body aches for 2 days EXAM: CHEST - 2 VIEW COMPARISON:  Chest x-ray 11/02/2020 and older FINDINGS: Normal cardiopericardial silhouette with calcified aorta. New consolidative opacity along the left upper lobe towards the lingula. Pneumonia is possible. No pneumothorax or effusion. No edema. Overlapping cardiac leads. Degenerative changes along the spine on the lateral view. Surgical clips in the upper abdomen. Apical pleural thickening bilaterally. Known hiatal hernia. IMPRESSION: Developing consolidative opacity in the left upper lobe towards the lingula. Possible pneumonia. Recommend follow-up to confirm clearance Electronically Signed   By: Jill Side M.D.   On: 04/10/2022 14:40    Procedures Procedures    Medications Ordered in ED Medications - No data to display  ED Course/ Medical  Decision Making/ A&P                             Medical Decision Making Amount and/or Complexity of Data Reviewed Labs: ordered. Radiology: ordered.   Deberah Castle 88 y.o. presented today for febrile. Working DDx that I considered at this time includes, but not limited to, urosepsis, UTI, viral infection, electrolyte abnormality  Review of prior external notes: 03/13/21 ED  Unique Tests and My Interpretation:  Lactic acid: 2.9 CBC with differential: Leukocytosis 19.9, anemia 8.9 CMP: Increased BUN 35  Discussion with Independent Historian: Son  Discussion of Management of Tests: [name], MSW  Risk: Low  Risk Stratification Score: none  Staffed with Davonna Belling, MD  R/o DDx: None  Plan: Patient presented for generalized pain, fever.,  On exam patient was febrile and tachycardic along with tachypnea.  Patient repeated multiple times that she does not want to be treated or do labs or imaging as she is tired of being in pain and has been in pain since getting COVID 2 years ago.  Patient is alert and oriented to person, place, time and has full decision-making capacity at this time.  I spoke with the patient about the risks associated with not proceeding with further lab work and patient verbally stated she accepted the risk.  I spoke with her son who is the power of attorney and she has a Education officer, museum and [name].  Palliative care will be consulted for comfort measures.   [name] was reached out to by palliative as patient is interested in hospice care.  She stated that her team would reach out to the patient and that the patient could be discharged.  Patient will be discharged at this time.  Patient was given return precautions.patient stable for discharge at this time.  Patient verbalized understanding of plan.         Final Clinical Impression(s) / ED Diagnoses Final diagnoses:  Fever, unspecified fever cause    Rx / DC Orders ED  Discharge Orders     None         Elvina Sidle 04/10/22 1613    Davonna Belling, MD 04/10/22 [phone]

## 2022-04-10 NOTE — Progress Notes (Signed)
Manufacturing engineer [facility]) Hospital Liaison Note  Plan is for patient to discharge today via EMS. PMT provider, A.  Pickenpack-Cousar, NP has requested [facility] hospice follow up with patient to offer hospice services. Appropriate [facility] staff have been notified and will touch base with patient.   If appropriate, please send signed and completed DNR home with patient/family. Please provide prescriptions at discharge as needed to ensure ongoing symptom management.    [facility] information and contact numbers given to family & above information shared with TOC.   Please call with any questions/concerns.    Thank you for the opportunity to participate in this patient's care.   Phillis Haggis, MSW [facility] Liaison  [phone]

## 2022-04-10 NOTE — ED Triage Notes (Signed)
Pt arrived via EMS, from home, fever chills, and worsening body pain x2 days. States chronic pain from arthritis but worse than normal today.  Recent UTI, did not finish last day of abx due to GI upset.   Febrile with EMS, given 650 mg tylenol. 400cc NS  20G L FA

## 2022-04-10 NOTE — Discharge Instructions (Signed)
Hospitalist will call tomorrow to discuss home care.  If symptoms worsen or if anything changes please return to ER.

## 2022-04-10 NOTE — ED Notes (Signed)
EDP made aware of patients vital signs. [facility] here to transport patient back to home. Patient continually refusing any interventions.

## 2022-04-10 NOTE — ED Notes (Signed)
[facility] called for transport

## 2022-04-11 ENCOUNTER — Telehealth: Payer: Self-pay | Admitting: Licensed Clinical Social Worker

## 2022-04-11 LAB — BLOOD CULTURE ID PANEL (REFLEXED) - BCID2

## 2022-04-11 NOTE — ED Notes (Signed)
After conversation with Dr Crissie Sickles spoke with pts son, making him aware of abnormal cultures, he states he will discuss options with pt. Pallative care started today, pt will return to hospital if she chooses to have infection treated. Son in agreement.

## 2022-04-11 NOTE — ED Notes (Signed)
Micro called  stating pt has 1 out of 4 positive blood culture, Anaerobic + gram cocci staph species. Dr Maryan Rued aware.

## 2022-04-12 ENCOUNTER — Encounter: Payer: Self-pay | Admitting: Licensed Clinical Social Worker

## 2022-04-12 ENCOUNTER — Telehealth: Payer: Self-pay | Admitting: Licensed Clinical Social Worker

## 2022-04-12 NOTE — Patient Outreach (Signed)
  Care Coordination   Follow Up Visit Note   04/12/2022 Name: DONNESHIA SALSER MRN: [id number] DOB: [birth year]  LAVAUN WOLFENDEN is a 88 y.o. year old female who sees Farrel Conners, MD for primary care. I spoke with  [employer]'s son by phone today.  What matters to the patients health and wellness today?  PCS/Hospice    Goals Addressed             This Visit's Progress    Obtain Supportive Services   On track    Activities and task to complete in order to accomplish goals.   Patient's son will follow up with hospice staff regarding DME delivery and supportive resources LCSW will collaborate with [place] Liaison about family's interest in aid services             SDOH assessments and interventions completed:  No     Care Coordination Interventions:  Yes, provided  Interventions Today    Flowsheet Row Most Recent Value  Chronic Disease   Chronic disease during today's visit Hypertension (HTN), Chronic Obstructive Pulmonary Disease (COPD)  General Interventions   General Interventions Discussed/Reviewed General Interventions Reviewed, Doctor Visits  [Pt's son informed LCSW of recent ED visit and initiation of hospice services, per pt request]  Doctor Visits Discussed/Reviewed Doctor Visits Discussed  Level of [place]  [LCSW encouraged pt's son to follow up with PCP office about completion of PCS form]  [place] Reviewed, Coping Strategies  [Caregiver strain]       Follow up plan: Follow up call scheduled for within a week    Encounter Outcome:  Pt. Visit Completed   [name], MSW, [email or web address] Phone [phone] 4:42 PM

## 2022-04-12 NOTE — Patient Instructions (Signed)
Visit Information  Thank you for taking time to visit with me today. Please don't hesitate to contact me if I can be of assistance to you.   Following are the goals we discussed today:   Goals Addressed             This Visit's Progress    Obtain Supportive Services   On track    Activities and task to complete in order to accomplish goals.   Patient's son will follow up with hospice staff regarding DME delivery and supportive resources LCSW will collaborate with [place] about family's interest in aid services            Please call the care guide team at [phone] if you need to cancel or reschedule your appointment.   If you are experiencing a Mental Health or [place] or need someone to talk to, please call the Suicide and Crisis Lifeline: 988 call 911   Patient verbalizes understanding of instructions and care plan provided today and agrees to view in [place]. Active MyChart status and patient understanding of how to access instructions and care plan via MyChart confirmed with patient.     The care management team will reach out to the patient again over the next 7 days.   [name], MSW, [email or web address] Phone [phone] 4:43 PM

## 2022-04-13 ENCOUNTER — Encounter: Payer: Self-pay | Admitting: Licensed Clinical Social Worker

## 2022-04-13 LAB — CULTURE, BLOOD (ROUTINE X 2)

## 2022-04-13 NOTE — Patient Instructions (Signed)
Visit Information  Thank you for taking time to visit with me today. Please don't hesitate to contact me if I can be of assistance to you.   Following are the goals we discussed today:   Goals Addressed             This Visit's Progress    Obtain Supportive Services   On track    Activities and task to complete in order to accomplish goals.   Patient's son will follow up with hospice staff regarding DME delivery and supportive resources LCSW will collaborate with [place] about family's interest in aid services              If you are experiencing a Mental Health or [place] or need someone to talk to, please call the Suicide and Crisis Lifeline: 988 call 911   Patient verbalizes understanding of instructions and care plan provided today and agrees to view in [place]. Active MyChart status and patient understanding of how to access instructions and care plan via MyChart confirmed with patient.     No further follow up required: Family will work with hospice for all additional supportive needs  [name], MSW, [name] Phone [phone] 8:26 AM

## 2022-04-13 NOTE — Patient Outreach (Signed)
  Care Coordination   Follow Up Visit Note   04/12/22 Name: [name] MRN: [id number] DOB: [birth year]  [name] is a 88 y.o. year old female who sees Farrel Conners, MD for primary care. I spoke with  [name]'s son by phone today.  What matters to the patients health and wellness today?  PCS/Caregiver Fatigue/Hospice    Goals Addressed             This Visit's Progress    Obtain Supportive Services   On track    Activities and task to complete in order to accomplish goals.   Patient's son will follow up with hospice staff regarding DME delivery and supportive resources LCSW will collaborate with [place] Liaison about family's interest in aid services             SDOH assessments and interventions completed:  No     Care Coordination Interventions:  Yes, provided  Interventions Today    Flowsheet Row Most Recent Value  Chronic Disease   Chronic disease during today's visit Hypertension (HTN), Chronic Obstructive Pulmonary Disease (COPD)  General Interventions   General Interventions Discussed/Reviewed General Interventions Reviewed, Communication with, Level of Care  Communication with --  [LCSW collaborated with [place], [place]. She informed LCSW of upcoming appts for patient and encouraged family to follow through with PCS for aid services. Family will have to pay out of pocket for any additional hours needed]  Level of [place]  [LCSW has submitted PCS form to PCP office for completion. Family will f/up]  Mental Health Interventions   Mental Health Discussed/Reviewed Other  [Caregiver stress and feelings of pt recieving hospice services acknowledged and discussed. Validation and Encouagement provided]       Follow up plan: No further intervention required. Family encouraged to continue working with hospice for all care coordination needs  Encounter Outcome:  Pt. Visit Completed   [name], MSW, [email or web address] Phone [phone] 8:25 AM

## 2022-04-14 ENCOUNTER — Encounter: Payer: Self-pay | Admitting: Licensed Clinical Social Worker

## 2022-04-14 NOTE — Patient Outreach (Signed)
  Care Coordination   Follow Up/Collaborative Visit Note   04/13/22 Name: [name] MRN: [id number] DOB: 12-Jun-1934  [name] is a 88 y.o. year old female who sees Farrel Conners, MD for primary care. I  spoke with PCP office and Hospice SW, Dimas [place]  What matters to the patients health and wellness today?  PCS    Goals Addressed             This Visit's Progress    COMPLETED: Obtain Supportive Services       Activities and task to complete in order to accomplish goals.   Patient's son will continue to work with [facility] to address pt goals and health needs             SDOH assessments and interventions completed:  No     Care Coordination Interventions:  Yes, provided   Follow up plan: No further intervention required.   Encounter Outcome:  Pt. Visit Completed   [name], MSW, [email or web address] Phone [phone] 12:48 PM

## 2022-04-14 NOTE — Patient Instructions (Signed)
Visit Information  Thank you for taking time to visit with me today. Please don't hesitate to contact me if I can be of assistance to you.   Following are the goals we discussed today:   Goals Addressed             This Visit's Progress    COMPLETED: Obtain Supportive Services       Activities and task to complete in order to accomplish goals.   Patient's son will continue to work with [facility] to address pt goals and health needs             If you are experiencing a [place] or [place] or need someone to talk to, please call the Suicide and Crisis Lifeline: 988 call 911   Patient verbalizes understanding of instructions and care plan provided today and agrees to view in [place]. Active MyChart status and patient understanding of how to access instructions and care plan via MyChart confirmed with patient.     No further follow up required: Family will work with hospice for additional support  [name], MSW, [email or web address] Phone [phone] 12:49 PM

## 2022-04-15 LAB — CULTURE, BLOOD (ROUTINE X 2)
Culture: NO GROWTH
Special Requests: ADEQUATE

## 2022-04-24 ENCOUNTER — Other Ambulatory Visit: Payer: Self-pay | Admitting: Neurology

## 2022-04-27 ENCOUNTER — Encounter: Payer: Self-pay | Admitting: Licensed Clinical Social Worker

## 2022-04-27 NOTE — Patient Outreach (Signed)
  Care Coordination   Follow Up/Collaboration Visit Note   04/27/2022 Name: [name] MRN: [id number] DOB: [birth year]  [name] is a 88 y.o. year old female who sees Margaret Conners, MD for primary care. I  Esther Hardy, [facility] Social Worker  What matters to the patients health and wellness today?  PCS Referral    SDOH assessments and interventions completed:  No     Care Coordination Interventions:  Yes, provided  Interventions Today    Flowsheet Row Most Recent Value  Chronic Disease   Chronic disease during today's visit Hypertension (HTN), Chronic Obstructive Pulmonary Disease (COPD)  General Interventions   General Interventions Discussed/Reviewed Communication with  [employer] collaborated with [facility] SW regarding PCS referral. Per Ms. Mancel Bale, [place] [facility] has not received referral from PCP office. LCSW emailed Ms. Mancel Bale the completed form to fax to [facility], for supportive services]  Communication with Social Work  Lonia Chimera SW]       Follow up plan: No further intervention required.   Encounter Outcome:  Pt. Visit Completed   [name], MSW, [email or web address] Phone [phone] 10:57 AM

## 2022-05-16 ENCOUNTER — Telehealth: Payer: Self-pay | Admitting: Gastroenterology

## 2022-05-16 NOTE — Telephone Encounter (Signed)
Ok, thank you for letting me know.

## 2022-05-16 NOTE — Telephone Encounter (Signed)
Spoke with Mr Schleisman. The patient entered Hospice care. She is having too much pain to walk now. She is on Morphine. Comfort care is the goal. Her GI symptoms are controlled presently. She is on Prednisone 10 mg and will taper to 5 mg in a few days. The son decreased her Lialda to 2.4 mg. She is doing well.

## 2022-05-16 NOTE — Telephone Encounter (Signed)
Patient's son is calling to cancel patient's appointment due to her not being able to walk. Wanted to let the nurse know that she is still taking her medication except she is only take 2 Mesalamine instead of 4, would like to speak with a nurse. Please advise

## 2022-05-31 ENCOUNTER — Ambulatory Visit: Payer: 59 | Admitting: Gastroenterology

## 2022-06-28 ENCOUNTER — Other Ambulatory Visit: Payer: Self-pay | Admitting: Nurse Practitioner

## 2022-07-06 ENCOUNTER — Other Ambulatory Visit: Payer: Self-pay | Admitting: Family Medicine

## 2022-07-14 ENCOUNTER — Telehealth: Payer: Self-pay | Admitting: Oncology

## 2022-07-14 NOTE — Telephone Encounter (Signed)
Dannielle Burn called about his mother she isn't doing well he called to cancel appointment, and wanted the Nurse to call him back.

## 2022-07-21 ENCOUNTER — Inpatient Hospital Stay: Payer: 59 | Admitting: Nurse Practitioner

## 2022-09-05 DIAGNOSIS — I10 Essential (primary) hypertension: Secondary | ICD-10-CM | POA: Diagnosis not present

## 2022-09-06 ENCOUNTER — Other Ambulatory Visit: Payer: Self-pay

## 2022-09-06 ENCOUNTER — Emergency Department (HOSPITAL_COMMUNITY)

## 2022-09-06 ENCOUNTER — Inpatient Hospital Stay (HOSPITAL_COMMUNITY)

## 2022-09-06 ENCOUNTER — Encounter (HOSPITAL_COMMUNITY): Payer: Self-pay

## 2022-09-06 ENCOUNTER — Observation Stay (HOSPITAL_COMMUNITY)
Admission: EM | Admit: 2022-09-06 | Discharge: 2022-09-07 | Disposition: A | Discharge status: Skilled Nursing Facility | Attending: Internal Medicine | Admitting: Internal Medicine

## 2022-09-06 DIAGNOSIS — Z79899 Other long term (current) drug therapy: Secondary | ICD-10-CM | POA: Diagnosis not present

## 2022-09-06 DIAGNOSIS — K644 Residual hemorrhoidal skin tags: Secondary | ICD-10-CM | POA: Diagnosis not present

## 2022-09-06 DIAGNOSIS — K449 Diaphragmatic hernia without obstruction or gangrene: Secondary | ICD-10-CM | POA: Diagnosis not present

## 2022-09-06 DIAGNOSIS — K219 Gastro-esophageal reflux disease without esophagitis: Secondary | ICD-10-CM | POA: Diagnosis present

## 2022-09-06 DIAGNOSIS — I10 Essential (primary) hypertension: Secondary | ICD-10-CM | POA: Diagnosis not present

## 2022-09-06 DIAGNOSIS — I2699 Other pulmonary embolism without acute cor pulmonale: Secondary | ICD-10-CM | POA: Diagnosis not present

## 2022-09-06 DIAGNOSIS — E039 Hypothyroidism, unspecified: Secondary | ICD-10-CM | POA: Diagnosis not present

## 2022-09-06 DIAGNOSIS — I82422 Acute embolism and thrombosis of left iliac vein: Secondary | ICD-10-CM | POA: Diagnosis not present

## 2022-09-06 DIAGNOSIS — L89310 Pressure ulcer of right buttock, unstageable: Secondary | ICD-10-CM | POA: Diagnosis not present

## 2022-09-06 DIAGNOSIS — J45909 Unspecified asthma, uncomplicated: Secondary | ICD-10-CM | POA: Insufficient documentation

## 2022-09-06 DIAGNOSIS — D509 Iron deficiency anemia, unspecified: Secondary | ICD-10-CM | POA: Diagnosis present

## 2022-09-06 DIAGNOSIS — R52 Pain, unspecified: Secondary | ICD-10-CM

## 2022-09-06 DIAGNOSIS — R911 Solitary pulmonary nodule: Secondary | ICD-10-CM | POA: Insufficient documentation

## 2022-09-06 DIAGNOSIS — R8281 Pyuria: Secondary | ICD-10-CM | POA: Diagnosis not present

## 2022-09-06 DIAGNOSIS — D72829 Elevated white blood cell count, unspecified: Secondary | ICD-10-CM | POA: Insufficient documentation

## 2022-09-06 DIAGNOSIS — J449 Chronic obstructive pulmonary disease, unspecified: Secondary | ICD-10-CM | POA: Insufficient documentation

## 2022-09-06 DIAGNOSIS — I82512 Chronic embolism and thrombosis of left femoral vein: Secondary | ICD-10-CM | POA: Diagnosis not present

## 2022-09-06 DIAGNOSIS — Z743 Need for continuous supervision: Secondary | ICD-10-CM | POA: Diagnosis not present

## 2022-09-06 DIAGNOSIS — L89159 Pressure ulcer of sacral region, unspecified stage: Secondary | ICD-10-CM | POA: Insufficient documentation

## 2022-09-06 DIAGNOSIS — R7981 Abnormal blood-gas level: Secondary | ICD-10-CM | POA: Diagnosis not present

## 2022-09-06 DIAGNOSIS — Z8616 Personal history of COVID-19: Secondary | ICD-10-CM | POA: Diagnosis not present

## 2022-09-06 DIAGNOSIS — K5900 Constipation, unspecified: Secondary | ICD-10-CM

## 2022-09-06 DIAGNOSIS — R8279 Other abnormal findings on microbiological examination of urine: Secondary | ICD-10-CM | POA: Diagnosis not present

## 2022-09-06 DIAGNOSIS — E871 Hypo-osmolality and hyponatremia: Secondary | ICD-10-CM | POA: Insufficient documentation

## 2022-09-06 DIAGNOSIS — K625 Hemorrhage of anus and rectum: Secondary | ICD-10-CM | POA: Diagnosis present

## 2022-09-06 DIAGNOSIS — Z8719 Personal history of other diseases of the digestive system: Secondary | ICD-10-CM

## 2022-09-06 DIAGNOSIS — Z66 Do not resuscitate: Secondary | ICD-10-CM

## 2022-09-06 DIAGNOSIS — K5730 Diverticulosis of large intestine without perforation or abscess without bleeding: Secondary | ICD-10-CM | POA: Diagnosis not present

## 2022-09-06 DIAGNOSIS — E7800 Pure hypercholesterolemia, unspecified: Secondary | ICD-10-CM | POA: Diagnosis present

## 2022-09-06 DIAGNOSIS — D649 Anemia, unspecified: Secondary | ICD-10-CM | POA: Diagnosis not present

## 2022-09-06 DIAGNOSIS — D62 Acute posthemorrhagic anemia: Secondary | ICD-10-CM | POA: Diagnosis not present

## 2022-09-06 DIAGNOSIS — K922 Gastrointestinal hemorrhage, unspecified: Secondary | ICD-10-CM | POA: Diagnosis present

## 2022-09-06 DIAGNOSIS — I774 Celiac artery compression syndrome: Secondary | ICD-10-CM | POA: Diagnosis not present

## 2022-09-06 DIAGNOSIS — R7889 Finding of other specified substances, not normally found in blood: Secondary | ICD-10-CM | POA: Diagnosis not present

## 2022-09-06 DIAGNOSIS — Z7189 Other specified counseling: Secondary | ICD-10-CM

## 2022-09-06 DIAGNOSIS — Z515 Encounter for palliative care: Secondary | ICD-10-CM

## 2022-09-06 DIAGNOSIS — K5190 Ulcerative colitis, unspecified, without complications: Secondary | ICD-10-CM | POA: Diagnosis present

## 2022-09-06 DIAGNOSIS — R6889 Other general symptoms and signs: Secondary | ICD-10-CM | POA: Diagnosis not present

## 2022-09-06 LAB — CBC WITH DIFFERENTIAL/PLATELET
Abs Immature Granulocytes: 0.08 10*3/uL — ABNORMAL HIGH (ref 0.00–0.07)
Basophils Absolute: 0.1 10*3/uL (ref 0.0–0.1)
Basophils Relative: 1 %
Eosinophils Absolute: 1.1 10*3/uL — ABNORMAL HIGH (ref 0.0–0.5)
Eosinophils Relative: 10 %
HCT: 17.8 % — ABNORMAL LOW (ref 36.0–46.0)
Hemoglobin: 5.2 g/dL — CL (ref 12.0–15.0)
Immature Granulocytes: 1 %
Lymphocytes Relative: 18 %
Lymphs Abs: 2 10*3/uL (ref 0.7–4.0)
MCH: 21.7 pg — ABNORMAL LOW (ref 26.0–34.0)
MCHC: 29.2 g/dL — ABNORMAL LOW (ref 30.0–36.0)
MCV: 74.2 fL — ABNORMAL LOW (ref 80.0–100.0)
Monocytes Absolute: 1.6 10*3/uL — ABNORMAL HIGH (ref 0.1–1.0)
Monocytes Relative: 15 %
Neutro Abs: 5.9 10*3/uL (ref 1.7–7.7)
Neutrophils Relative %: 55 %
Platelets: 772 10*3/uL — ABNORMAL HIGH (ref 150–400)
RBC: 2.4 MIL/uL — ABNORMAL LOW (ref 3.87–5.11)
RDW: 19.8 % — ABNORMAL HIGH (ref 11.5–15.5)
WBC: 10.7 10*3/uL — ABNORMAL HIGH (ref 4.0–10.5)
nRBC: 0 % (ref 0.0–0.2)

## 2022-09-06 LAB — TYPE AND SCREEN
ABO/RH(D): O NEG
Antibody Screen: NEGATIVE
Unit division: 0

## 2022-09-06 LAB — BPAM RBC
Blood Product Expiration Date: 202408312359
Unit Type and Rh: 9500

## 2022-09-06 LAB — POC OCCULT BLOOD, ED: Fecal Occult Bld: POSITIVE — AB

## 2022-09-06 LAB — COMPREHENSIVE METABOLIC PANEL
ALT: 16 U/L (ref 0–44)
AST: 30 U/L (ref 15–41)
Albumin: 2.1 g/dL — ABNORMAL LOW (ref 3.5–5.0)
Alkaline Phosphatase: 58 U/L (ref 38–126)
Anion gap: 6 (ref 5–15)
BUN: 13 mg/dL (ref 8–23)
CO2: 26 mmol/L (ref 22–32)
Calcium: 8.6 mg/dL — ABNORMAL LOW (ref 8.9–10.3)
Chloride: 101 mmol/L (ref 98–111)
Creatinine, Ser: 0.47 mg/dL (ref 0.44–1.00)
GFR, Estimated: >60 mL/min (ref >60)
Glucose, Bld: 110 mg/dL — ABNORMAL HIGH (ref 70–99)
Potassium: 3.5 mmol/L (ref 3.5–5.1)
Sodium: 133 mmol/L — ABNORMAL LOW (ref 135–145)
Total Bilirubin: 0.2 mg/dL — ABNORMAL LOW (ref 0.3–1.2)
Total Protein: 6.2 g/dL — ABNORMAL LOW (ref 6.5–8.1)

## 2022-09-06 LAB — PREPARE RBC (CROSSMATCH)

## 2022-09-06 LAB — I-STAT CG4 LACTIC ACID, ED: Lactic Acid, Venous: 1.8 mmol/L (ref 0.5–1.9)

## 2022-09-06 MED ORDER — IOHEXOL 350 MG/ML SOLN
100.0000 mL | Freq: Once | INTRAVENOUS | Status: AC | PRN
  Administered 2022-09-06: 100 mL via INTRAVENOUS

## 2022-09-06 MED ORDER — SODIUM CHLORIDE 0.9% IV SOLUTION: Freq: Once | INTRAVENOUS | Status: AC

## 2022-09-06 MED ORDER — IOHEXOL 350 MG/ML SOLN
60.0000 mL | Freq: Once | INTRAVENOUS | Status: AC | PRN
  Administered 2022-09-06: 50 mL via INTRAVENOUS

## 2022-09-06 MED ORDER — PREGABALIN 100 MG PO CAPS
200.0000 mg | ORAL_CAPSULE | Freq: Two times a day (BID) | ORAL | Status: DC
  Administered 2022-09-07 (×2): 200 mg via ORAL
  Filled 2022-09-06: qty 2
  Filled 2022-09-06: qty 4

## 2022-09-06 MED ORDER — LEVOTHYROXINE SODIUM 100 MCG PO TABS
200.0000 ug | ORAL_TABLET | Freq: Every day | ORAL | Status: DC
  Filled 2022-09-06: qty 2

## 2022-09-06 NOTE — ED Provider Notes (Signed)
  Physical Exam  BP (!) 128/56   Pulse 89   Temp 98.6 F (37 C) (Oral)   Resp 16   Ht 5\' 8"  (1.727 m)   Wt 69 kg   SpO2 100%   BMI 23.13 kg/m   Physical Exam  Procedures  Procedures  ED Course / MDM   Clinical Course as of 09/06/22 2339  Tue Sep 06, 2022  2128 Fecal Occult Blood, POC(!): POSITIVE [CA]  2339 Consult to hospitalist Dr. Margo Aye who is agreeable to admit this patient to her service.  I appreciate her collaboration in the care of this patient. [RS]    Clinical Course User Index [CA] Mannie Stabile, PA-C [RS] Kassity Woodson, Eugene Gavia, PA-C   Medical Decision Making Amount and/or Complexity of Data Reviewed Labs: ordered. Decision-making details documented in ED Course. Radiology: ordered.  Risk Prescription drug management. Decision regarding hospitalization.     Care of this patient assumed from preceding ED provider Claudette Stapler, PA-C at time of shift change. Please see her associated note for further insight into the patient's ED course.  In brief patient is an 88 year old female who is currently under the care of  [facility] who presents with anemia. Hbg of 5, hemoccult +. Pending CTA and blood transfusion at time of shift change. Admission pending CT results.   Radiologist contacted EDP Dr. Rush Landmark; incidental findings of right segmental PE and left external iliac DVT.  Dispo and intervention plan discussed with patient and her son, Ed, at the bedside.  They desire full intervention at this time.  Consult to hospitalist pending at this time.  K[place] and her son voiced understanding of her medical evaluation and treatment plan. Each of their questions answered to their expressed satisfaction.  She is amenable to plan for admission at this time.   This chart was dictated using voice recognition software, Dragon. Despite the best efforts of this provider to proofread and correct errors, errors may still occur which can change documentation  meaning.      Sherrilee Gilles 09/06/22 2339    Tegeler, Canary Brim, MD 09/06/22 [phone]

## 2022-09-06 NOTE — ED Notes (Signed)
[facility] called regarding pt and states pt is a hospice pt.

## 2022-09-06 NOTE — ED Triage Notes (Signed)
BIB EMS from [facility] for abnormal labs. Hemoglobin was 4.8. Pt denies any sx. Bedbound at baseline with foley. No blood in foley and no blood in stool reported by facility.

## 2022-09-06 NOTE — ED Notes (Signed)
Blood consent signed by pt son who is her POA

## 2022-09-06 NOTE — ED Provider Notes (Signed)
[place] EMERGENCY DEPARTMENT AT [facility] Provider Note   CSN: [id number] Arrival date & time: 09/06/22  1953     History  Chief Complaint  Patient presents with   Abnormal Labs    Hemoglobin 4.8    [name] is a 88 y.o. female with a past medical history of COPD, iron deficiency anemia, ulcerative colitis, history of diverticulitis who presents to the ED due to low hemoglobin.  Per EMS patient had blood work checked at her living facility and was found to have a hemoglobin of 4.8.  Patient denies any bleeding however, appears confused at bedside.  Patient states she typically ambulates without any assistance however, patient has been bedbound since March 3 per both of her sons Ramon Dredge and Michele Mcalpine who I spoke to on the phone.  Ramon Dredge notes that patient's hemoglobin is checked every 6 months. Previous history of rectal bleeding. He notes bleeding improved after starting mesalamine.  Hemoglobin was recently normal.  Denies any known episodes of bleeding.  Ramon Dredge notes patient has been more confused over the past few months ever since being bedbound. Edward consented for blood transfusion if needed.   History obtained from patient, patient's sons, EMS and past medical records. No interpreter used during encounter.   Level 5 caveat secondary to memory impariement    Home Medications Prior to Admission medications   Medication Sig Start Date End Date Taking? Authorizing Provider  acetaminophen (TYLENOL) 500 MG tablet Take 500 mg by mouth every 6 (six) hours as needed for moderate pain.    [provider]  beclomethasone (QVAR REDIHALER) 40 MCG/ACT inhaler Inhale 1 puff into the lungs 2 (two) times daily. 04/05/22   Karie Georges, MD  DULoxetine (CYMBALTA) 20 MG capsule TAKE 1 CAPSULE BY MOUTH EVERY DAY 04/27/22   Levert Feinstein, MD  Ferrous Sulfate (IRON) 325 (65 FE) MG TABS Take 1 tablet by mouth daily.     [provider]  levothyroxine (SYNTHROID) 75 MCG  tablet TAKE 1 TABLET BY MOUTH EVERY DAY 07/06/22   Karie Georges, MD  mesalamine (LIALDA) 1.2 g EC tablet TAKE 2 TABLETS BY MOUTH DAILY WITH BREAKFAST. 06/28/22   Esterwood, Amy S, PA-C  Spacer/Aero-Holding Chambers (E-Z SPACER) inhaler Use as instructed 11/08/17   Wynn Banker, MD      Allergies    [place] nut (berthollefia excelsa), Fluconazole, Metronidazole, Pepcid [famotidine], Shellfish allergy, Macrobid [nitrofurantoin], and Pantoprazole    Review of Systems   Review of Systems  Unable to perform ROS: Mental status change    Physical Exam Updated Vital Signs BP (!) 128/56   Pulse 89   Temp 98.6 F (37 C) (Oral)   Resp 16   Ht 5\' 8"  (1.727 m)   Wt 69 kg   SpO2 100%   BMI 23.13 kg/m  Physical Exam Vitals and nursing note reviewed.  Constitutional:      General: She is not in acute distress.    Appearance: She is not ill-appearing.  HENT:     Head: Normocephalic.  Eyes:     Pupils: Pupils are equal, round, and reactive to light.  Cardiovascular:     Rate and Rhythm: Normal rate and regular rhythm.     Pulses: Normal pulses.     Heart sounds: Normal heart sounds. No murmur heard.    No friction rub. No gallop.  Pulmonary:     Effort: Pulmonary effort is normal.     Breath sounds: Normal breath sounds.  Abdominal:     General: Abdomen is flat. There is no distension.     Palpations: Abdomen is soft.     Tenderness: There is no abdominal tenderness. There is no guarding or rebound.  Genitourinary:    Comments: Rectal exam performed with chaperone in room.  1 external hemorrhoid without evidence of thrombosis.  Dark brown stool. Musculoskeletal:        General: Normal range of motion.     Cervical back: Neck supple.  Skin:    General: Skin is warm and dry.     Comments: Sore to sacral region as pictured below  Neurological:     General: No focal deficit present.     Mental Status: She is alert.     Comments: Appears slightly confused. Able to tell me  location. States year is 2022. Believes she ambulates, but actually bedbound  Psychiatric:        Mood and Affect: Mood normal.        Behavior: Behavior normal.     ED Results / Procedures / Treatments   Labs (all labs ordered are listed, but only abnormal results are displayed) Labs Reviewed  CBC WITH DIFFERENTIAL/PLATELET - Abnormal; Notable for the following components:      Result Value   WBC 10.7 (*)    RBC 2.40 (*)    Hemoglobin 5.2 (*)    HCT 17.8 (*)    MCV 74.2 (*)    MCH 21.7 (*)    MCHC 29.2 (*)    RDW 19.8 (*)    Platelets 772 (*)    Monocytes Absolute 1.6 (*)    Eosinophils Absolute 1.1 (*)    Abs Immature Granulocytes 0.08 (*)    All other components within normal limits  COMPREHENSIVE METABOLIC PANEL - Abnormal; Notable for the following components:   Sodium 133 (*)    Glucose, Bld 110 (*)    Calcium 8.6 (*)    Total Protein 6.2 (*)    Albumin 2.1 (*)    Total Bilirubin 0.2 (*)    All other components within normal limits  POC OCCULT BLOOD, ED - Abnormal; Notable for the following components:   Fecal Occult Bld POSITIVE (*)    All other components within normal limits  URINALYSIS, ROUTINE W REFLEX MICROSCOPIC  I-STAT CG4 LACTIC ACID, ED  TYPE AND SCREEN  PREPARE RBC (CROSSMATCH)    EKG EKG Interpretation Date/Time:  Tuesday September 06 2022 21:01:30 EDT Ventricular Rate:  87 PR Interval:  162 QRS Duration:  82 QT Interval:  384 QTC Calculation: 462 R Axis:   30  Text Interpretation: Sinus rhythm Borderline low voltage, extremity leads when compared to prior, overall similar appearance. No STEMI Confirmed by Theda Belfast ([id number]) on 09/06/2022 9:09:12 PM  Radiology No results found.  Procedures .Critical Care  Performed by: Mannie Stabile, PA-C Authorized by: Mannie Stabile, PA-C   Critical care provider statement:    Critical care time (minutes):  38   Critical care was necessary to treat or prevent imminent or life-threatening  deterioration of the following conditions:  Circulatory failure   Critical care was time spent personally by me on the following activities:  Development of treatment plan with patient or surrogate, discussions with consultants, evaluation of patient's response to treatment, examination of patient, ordering and review of laboratory studies, ordering and review of radiographic studies, ordering and performing treatments and interventions, pulse oximetry, re-evaluation of patient's condition and review of old charts   I  assumed direction of critical care for this patient from another provider in my specialty: no       Medications Ordered in ED Medications  0.9 %  sodium chloride infusion (Manually program via Guardrails IV Fluids) (has no administration in time range)  iohexol (OMNIPAQUE) 350 MG/ML injection 100 mL (100 mLs Intravenous Contrast Given 09/06/22 2158)    ED Course/ Medical Decision Making/ A&P Clinical Course as of 09/06/22 2206  Tue Sep 06, 2022  2128 Fecal Occult Blood, POC(!): POSITIVE [CA]    Clinical Course User Index [CA] Mannie Stabile, PA-C                                 Medical Decision Making Amount and/or Complexity of Data Reviewed Independent Historian: caregiver    Details: Discussed with patient's sons Ramon Dredge and Michele Mcalpine over the phone External Data Reviewed: notes. Labs: ordered. Decision-making details documented in ED Course. Radiology: ordered. ECG/medicine tests: ordered and independent interpretation performed. Decision-making details documented in ED Course.  Risk Prescription drug management.   This patient presents to the ED for concern of low hemoglobin, this involves an extensive number of treatment options, and is a complaint that carries with it a high risk of complications and morbidity.  The differential diagnosis includes GI bleed, hemorrhoid, iron deficiency, inaccurate  lab, etc  88 year old female presents to the ED due to low  hemoglobin.  Had her hemoglobin checked at living facility which was 4.8.  Patient denies any bleeding however, appears slightly confused. Admits to generalized weakness. Spoke to patient's son Ramon Dredge and Michele Mcalpine notes patient has been confused ever since being bedbound in March. Hospice patient. Ramon Dredge and Phil both consented to blood transfusion over the phone. Family wishes for full work-up.  Upon arrival, patient hemodynamically stable. On 2L [place] which is her baseline per EMS. Rectal exam performed with chaperone in room.  Dark stool.  Not melanotic.  1 external hemorrhoid without evidence of thrombosis.  Sacral wound as noted above.  Routine labs ordered.  CBC to recheck hemoglobin.  Lactic acid.  Type and screen.  CBC significant for mild leukocytosis at 10.7.  Anemia with hemoglobin 5.2.  Blood transfusion started in the ED after consent from patient's sons.  Lactic acid normal.  EKG demonstrates normal sinus rhythm.  No signs of acute ischemia.  CMP significant for mild hyponatremia 133.  Normal renal function.  Placed a secure chat to Dr. Rhea Belton with GI to request consultation tomorrow during admission. Patient has remained hemodynamically stable during ED stay.   Patient handed off to [employer], PA-C at shift change pending CTA abdomen/pelvis and admission.  Discussed with Dr. Rush Landmark who agrees with assessment and plan  Lives in living facility bedbound       Final Clinical Impression(s) / ED Diagnoses Final diagnoses:  Low hemoglobin    Rx / DC Orders ED Discharge Orders     None         Jesusita Oka 09/06/22 2212    Tegeler, Canary Brim, MD 09/06/22 [phone]

## 2022-09-07 ENCOUNTER — Inpatient Hospital Stay (HOSPITAL_BASED_OUTPATIENT_CLINIC_OR_DEPARTMENT_OTHER)

## 2022-09-07 DIAGNOSIS — Z66 Do not resuscitate: Secondary | ICD-10-CM | POA: Diagnosis not present

## 2022-09-07 DIAGNOSIS — Z7189 Other specified counseling: Secondary | ICD-10-CM

## 2022-09-07 DIAGNOSIS — K5130 Ulcerative (chronic) rectosigmoiditis without complications: Secondary | ICD-10-CM

## 2022-09-07 DIAGNOSIS — R918 Other nonspecific abnormal finding of lung field: Secondary | ICD-10-CM | POA: Diagnosis not present

## 2022-09-07 DIAGNOSIS — R58 Hemorrhage, not elsewhere classified: Secondary | ICD-10-CM | POA: Diagnosis not present

## 2022-09-07 DIAGNOSIS — K922 Gastrointestinal hemorrhage, unspecified: Secondary | ICD-10-CM | POA: Diagnosis not present

## 2022-09-07 DIAGNOSIS — Z79899 Other long term (current) drug therapy: Secondary | ICD-10-CM | POA: Diagnosis not present

## 2022-09-07 DIAGNOSIS — J9811 Atelectasis: Secondary | ICD-10-CM | POA: Diagnosis not present

## 2022-09-07 DIAGNOSIS — K5900 Constipation, unspecified: Secondary | ICD-10-CM

## 2022-09-07 DIAGNOSIS — K219 Gastro-esophageal reflux disease without esophagitis: Secondary | ICD-10-CM | POA: Diagnosis not present

## 2022-09-07 DIAGNOSIS — J90 Pleural effusion, not elsewhere classified: Secondary | ICD-10-CM | POA: Diagnosis not present

## 2022-09-07 DIAGNOSIS — Z86711 Personal history of pulmonary embolism: Secondary | ICD-10-CM | POA: Diagnosis not present

## 2022-09-07 DIAGNOSIS — Z8719 Personal history of other diseases of the digestive system: Secondary | ICD-10-CM

## 2022-09-07 DIAGNOSIS — I2699 Other pulmonary embolism without acute cor pulmonale: Secondary | ICD-10-CM | POA: Diagnosis not present

## 2022-09-07 DIAGNOSIS — Z7401 Bed confinement status: Secondary | ICD-10-CM | POA: Diagnosis not present

## 2022-09-07 DIAGNOSIS — Z515 Encounter for palliative care: Secondary | ICD-10-CM | POA: Diagnosis not present

## 2022-09-07 DIAGNOSIS — D649 Anemia, unspecified: Secondary | ICD-10-CM

## 2022-09-07 DIAGNOSIS — R52 Pain, unspecified: Secondary | ICD-10-CM | POA: Diagnosis not present

## 2022-09-07 DIAGNOSIS — R404 Transient alteration of awareness: Secondary | ICD-10-CM | POA: Diagnosis not present

## 2022-09-07 LAB — HEMOGLOBIN AND HEMATOCRIT, BLOOD
HCT: 26.6 % — ABNORMAL LOW (ref 36.0–46.0)
Hemoglobin: 7.9 g/dL — ABNORMAL LOW (ref 12.0–15.0)

## 2022-09-07 LAB — COMPREHENSIVE METABOLIC PANEL
ALT: 14 U/L (ref 0–44)
AST: 30 U/L (ref 15–41)
Albumin: 2.1 g/dL — ABNORMAL LOW (ref 3.5–5.0)
Alkaline Phosphatase: 53 U/L (ref 38–126)
Anion gap: 9 (ref 5–15)
BUN: 11 mg/dL (ref 8–23)
CO2: 22 mmol/L (ref 22–32)
Calcium: 8.7 mg/dL — ABNORMAL LOW (ref 8.9–10.3)
Chloride: 101 mmol/L (ref 98–111)
Creatinine, Ser: 0.32 mg/dL — ABNORMAL LOW (ref 0.44–1.00)
GFR, Estimated: >60 mL/min (ref >60)
Glucose, Bld: 100 mg/dL — ABNORMAL HIGH (ref 70–99)
Potassium: 4 mmol/L (ref 3.5–5.1)
Sodium: 132 mmol/L — ABNORMAL LOW (ref 135–145)
Total Bilirubin: 1.2 mg/dL (ref 0.3–1.2)
Total Protein: 6 g/dL — ABNORMAL LOW (ref 6.5–8.1)

## 2022-09-07 LAB — MAGNESIUM: Magnesium: 1.8 mg/dL (ref 1.7–2.4)

## 2022-09-07 LAB — MRSA NEXT GEN BY PCR, NASAL: MRSA by PCR Next Gen: DETECTED — AB

## 2022-09-07 LAB — CBC
HCT: 26.7 % — ABNORMAL LOW (ref 36.0–46.0)
Hemoglobin: 7.9 g/dL — ABNORMAL LOW (ref 12.0–15.0)
MCH: 23.8 pg — ABNORMAL LOW (ref 26.0–34.0)
MCHC: 29.6 g/dL — ABNORMAL LOW (ref 30.0–36.0)
MCV: 80.4 fL (ref 80.0–100.0)
Platelets: 638 10*3/uL — ABNORMAL HIGH (ref 150–400)
RBC: 3.32 MIL/uL — ABNORMAL LOW (ref 3.87–5.11)
RDW: 19.6 % — ABNORMAL HIGH (ref 11.5–15.5)
WBC: 11.4 10*3/uL — ABNORMAL HIGH (ref 4.0–10.5)
nRBC: 0 % (ref 0.0–0.2)

## 2022-09-07 LAB — PREPARE RBC (CROSSMATCH)

## 2022-09-07 LAB — PHOSPHORUS: Phosphorus: 3.1 mg/dL (ref 2.5–4.6)

## 2022-09-07 MED ORDER — LIDOCAINE 5 % EX PTCH
1.0000 | MEDICATED_PATCH | Freq: Every day | CUTANEOUS | Status: DC
  Administered 2022-09-07: 1 via TRANSDERMAL
  Filled 2022-09-07: qty 1

## 2022-09-07 MED ORDER — BIOTENE DRY MOUTH MT LIQD: 15.0000 mL | OROMUCOSAL | Status: DC | PRN

## 2022-09-07 MED ORDER — MORPHINE SULFATE ER 15 MG PO TBCR: 15.0000 mg | EXTENDED_RELEASE_TABLET | Freq: Two times a day (BID) | ORAL | Status: DC

## 2022-09-07 MED ORDER — GLYCOPYRROLATE 0.2 MG/ML IJ SOLN: 0.2000 mg | INTRAMUSCULAR | Status: DC | PRN

## 2022-09-07 MED ORDER — ONDANSETRON 4 MG PO TBDP: 4.0000 mg | ORAL_TABLET | Freq: Four times a day (QID) | ORAL | Status: DC | PRN

## 2022-09-07 MED ORDER — MORPHINE SULFATE (PF) 2 MG/ML IV SOLN
1.0000 mg | INTRAVENOUS | Status: DC | PRN
  Filled 2022-09-07: qty 1

## 2022-09-07 MED ORDER — POLYETHYLENE GLYCOL 3350 17 G PO PACK: 17.0000 g | PACK | Freq: Every day | ORAL | Status: DC | PRN

## 2022-09-07 MED ORDER — MESALAMINE 1.2 G PO TBEC
2.4000 g | DELAYED_RELEASE_TABLET | Freq: Two times a day (BID) | ORAL | Status: DC
  Filled 2022-09-07: qty 2

## 2022-09-07 MED ORDER — MESALAMINE 1.2 G PO TBEC
2.4000 g | DELAYED_RELEASE_TABLET | Freq: Every day | ORAL | Status: DC
  Filled 2022-09-07: qty 2

## 2022-09-07 MED ORDER — ACETAMINOPHEN 650 MG RE SUPP: 650.0000 mg | Freq: Four times a day (QID) | RECTAL | Status: DC | PRN

## 2022-09-07 MED ORDER — HALOPERIDOL 1 MG PO TABS: 0.5000 mg | ORAL_TABLET | ORAL | Status: DC | PRN

## 2022-09-07 MED ORDER — MORPHINE SULFATE (PF) 2 MG/ML IV SOLN
1.0000 mg | INTRAVENOUS | Status: DC | PRN
  Administered 2022-09-07: 1 mg via INTRAVENOUS
  Filled 2022-09-07: qty 1

## 2022-09-07 MED ORDER — POLYVINYL ALCOHOL 1.4 % OP SOLN: 1.0000 [drp] | Freq: Four times a day (QID) | OPHTHALMIC | Status: DC | PRN

## 2022-09-07 MED ORDER — MORPHINE SULFATE (PF) 2 MG/ML IV SOLN
2.0000 mg | INTRAVENOUS | Status: AC
  Administered 2022-09-07: 2 mg via INTRAVENOUS
  Filled 2022-09-07: qty 1

## 2022-09-07 MED ORDER — MEDIHONEY WOUND/BURN DRESSING EX PSTE: 1.0000 | PASTE | Freq: Every day | CUTANEOUS | Status: DC

## 2022-09-07 MED ORDER — MORPHINE SULFATE 10 MG/5ML PO SOLN
5.0000 mg | ORAL | Status: DC | PRN
  Administered 2022-09-07: 10 mg via ORAL
  Filled 2022-09-07: qty 5

## 2022-09-07 MED ORDER — LIDOCAINE 4 % EX PTCH: 1.0000 | MEDICATED_PATCH | Freq: Two times a day (BID) | CUTANEOUS | 0 refills | Status: DC

## 2022-09-07 MED ORDER — SENNA 8.6 MG PO TABS
2.0000 | ORAL_TABLET | Freq: Two times a day (BID) | ORAL | Status: DC
  Filled 2022-09-07: qty 2

## 2022-09-07 MED ORDER — GLYCOPYRROLATE 1 MG PO TABS: 1.0000 mg | ORAL_TABLET | ORAL | Status: DC | PRN

## 2022-09-07 MED ORDER — ORAL CARE MOUTH RINSE: 15.0000 mL | OROMUCOSAL | Status: DC | PRN

## 2022-09-07 MED ORDER — OXYCODONE HCL 5 MG PO TABS: 5.0000 mg | ORAL_TABLET | Freq: Four times a day (QID) | ORAL | Status: DC | PRN

## 2022-09-07 MED ORDER — MORPHINE SULFATE ER 30 MG PO TBCR
30.0000 mg | EXTENDED_RELEASE_TABLET | Freq: Two times a day (BID) | ORAL | Status: DC
  Administered 2022-09-07: 30 mg via ORAL
  Filled 2022-09-07: qty 1

## 2022-09-07 MED ORDER — HALOPERIDOL 0.5 MG PO TABS: 0.5000 mg | ORAL_TABLET | ORAL | 0 refills | Status: DC | PRN

## 2022-09-07 MED ORDER — PROCHLORPERAZINE EDISYLATE 10 MG/2ML IJ SOLN
5.0000 mg | Freq: Four times a day (QID) | INTRAMUSCULAR | Status: DC | PRN
  Administered 2022-09-07: 5 mg via INTRAVENOUS
  Filled 2022-09-07: qty 2

## 2022-09-07 MED ORDER — AMOXICILLIN-POT CLAVULANATE 875-125 MG PO TABS
1.0000 | ORAL_TABLET | Freq: Two times a day (BID) | ORAL | Status: DC
  Administered 2022-09-07: 1 via ORAL
  Filled 2022-09-07: qty 1

## 2022-09-07 MED ORDER — SODIUM CHLORIDE 0.9% IV SOLUTION: Freq: Once | INTRAVENOUS | Status: DC

## 2022-09-07 MED ORDER — HALOPERIDOL LACTATE 5 MG/ML IJ SOLN: 0.5000 mg | INTRAMUSCULAR | Status: DC | PRN

## 2022-09-07 MED ORDER — POLYETHYLENE GLYCOL 3350 17 G PO PACK
17.0000 g | PACK | Freq: Every day | ORAL | Status: DC
  Administered 2022-09-07: 17 g via ORAL
  Filled 2022-09-07: qty 1

## 2022-09-07 MED ORDER — HYDROMORPHONE HCL 1 MG/ML IJ SOLN: 0.5000 mg | INTRAMUSCULAR | Status: DC

## 2022-09-07 MED ORDER — MEDIHONEY WOUND/BURN DRESSING EX PSTE
1.0000 | PASTE | Freq: Every day | CUTANEOUS | Status: DC
  Administered 2022-09-07: 1 via TOPICAL
  Filled 2022-09-07: qty 44

## 2022-09-07 MED ORDER — HYDROMORPHONE HCL 1 MG/ML IJ SOLN: 0.5000 mg | Freq: Once | INTRAMUSCULAR | Status: DC | PRN

## 2022-09-07 MED ORDER — PANTOPRAZOLE SODIUM 40 MG IV SOLR
40.0000 mg | Freq: Two times a day (BID) | INTRAVENOUS | Status: DC
  Administered 2022-09-07 (×2): 40 mg via INTRAVENOUS
  Filled 2022-09-07 (×2): qty 10

## 2022-09-07 MED ORDER — MORPHINE SULFATE ER 30 MG PO TBCR: 30.0000 mg | EXTENDED_RELEASE_TABLET | Freq: Two times a day (BID) | ORAL | 0 refills | Status: DC

## 2022-09-07 MED ORDER — ONDANSETRON HCL 4 MG/2ML IJ SOLN: 4.0000 mg | Freq: Four times a day (QID) | INTRAMUSCULAR | Status: DC | PRN

## 2022-09-07 MED ORDER — CHLORHEXIDINE GLUCONATE CLOTH 2 % EX PADS
6.0000 | MEDICATED_PAD | Freq: Every day | CUTANEOUS | Status: DC
  Administered 2022-09-07: 6 via TOPICAL

## 2022-09-07 MED ORDER — HALOPERIDOL LACTATE 2 MG/ML PO CONC: 0.5000 mg | ORAL | Status: DC | PRN

## 2022-09-07 MED ORDER — GERHARDT'S BUTT CREAM
TOPICAL_CREAM | Freq: Two times a day (BID) | CUTANEOUS | Status: DC
  Filled 2022-09-07: qty 1

## 2022-09-07 MED ORDER — ACETAMINOPHEN 325 MG PO TABS: 650.0000 mg | ORAL_TABLET | Freq: Four times a day (QID) | ORAL | Status: DC | PRN

## 2022-09-07 MED ORDER — MELATONIN 5 MG PO TABS: 5.0000 mg | ORAL_TABLET | Freq: Every evening | ORAL | Status: DC | PRN

## 2022-09-07 NOTE — Evaluation (Signed)
SLP Cancellation Note  Patient Details Name: [name] MRN: [id number] DOB: 23-Aug-1934   Cancelled treatment:       Reason Eval/Treat Not Completed: Other (comment);Medical issues which prohibited therapy (Swallow eval order received, pt npo pending GI evaluation; will continue efforts.  Noted per chart banner that pt is followed by hospice.)  Rolena Infante, MS [facility] SLP Acute Rehab Services Office [phone]   Chales Abrahams 09/07/2022, 9:24 AM

## 2022-09-07 NOTE — Care Management Obs Status (Signed)
MEDICARE OBSERVATION STATUS NOTIFICATION   Patient Details  Name: [name] MRN: [id number] Date of Birth: [birth year]   Medicare Observation Status Notification Given:  Yes    Lavenia Atlas, RN 09/07/2022, 3:40 PM

## 2022-09-07 NOTE — Progress Notes (Signed)
Spoke with the patients son,  Ramon Dredge who confirmed foley catheter placement by hospice care RN, 7-10 days ago.

## 2022-09-07 NOTE — Consult Note (Signed)
WOC Nurse Consult Note: Reason for Consult: pressure injuries Patient from SNF, bedbound for several months, under Hospice care currently in facility, admitted for anemia.  Wound type: Unstageable Pressure Injury: sacrum; medial; darkened tissue with small amount of slough Unstageable Pressure Injury; left upper buttock/sacrum; 100% non viable tissue Unstageable Pressure Injury; right upper buttock/sacrum, 2 small areas with thin skin bridge between the wounds; 100% non viable tissue Irritant contact dermatitis over the bilateral buttocks Pressure Injury POA: Yes Measurement:see nursing flow sheets Wound bed: see above  Drainage (amount, consistency, odor) silver dressing in image, saturated with yellow/brown drainage.  Periwound:see above, ICD  Dressing procedure/placement/frequency: Cleanse sacral and buttock wounds with saline, pat dry Apply 1/4" thick layer of Medihoney over each wound, top with dry dressing.  Gerhardt's butt cream to the affected areas of dermatitis over the buttocks and perirectal Low air loss mattress in place while in the ICU for moisture management and pressure redistribution, will need air mattress if patient transfers out of the ICU.    Re consult if needed, will not follow at this time. Thanks  Samarth Ogle [employer], RN,CWOCN, CNS, CWON-AP [phone])

## 2022-09-07 NOTE — Care Management CC44 (Signed)
Condition Code 44 Documentation Completed  Patient Details  Name: [name] MRN: [id number] Date of Birth: October 20, 1934   Condition Code 44 given:  Yes Patient signature on Condition Code 44 notice:  Yes Documentation of 2 MD's agreement:  Yes Code 44 added to claim:  Yes    Lavenia Atlas, RN 09/07/2022, 3:40 PM

## 2022-09-07 NOTE — Progress Notes (Signed)
09/07/2022 MRSA nasal swab test positive - communicated to Thea Gist with [facility] and on-call provider for [facility], Anthoney Harada NP. Copy of report included in facility paperwork. Patient picked up by ambulance service for transport to facility at 1922 hours. Cindy S. Clelia Croft BSN, RN, [employer], CCRN 09/07/2022 8:15 PM

## 2022-09-07 NOTE — Progress Notes (Signed)
Report called to Shady Dale, at  [facility] [phone]. She denied questions or concerns related to patients continued plan of care.

## 2022-09-07 NOTE — H&P (Addendum)
History and Physical  [name] VWU:[id number] DOB: April 18, 1934 DOA: 09/06/2022  Referring physician: Bernette Mayers  PCP: System, Provider Not In  Outpatient Specialists: Hospice care. Patient coming from: SNF  Chief Complaint: Low hemoglobin.  HPI: [name] is a 88 y.o. female with medical history significant for ulcerative colitis on mesalamine, hypothyroidism, chronic pain syndrome on MS Contin, chronic constipation, who presents from SNF due to abnormal labs, hemoglobin of 4.8K.  Associated with generalized weakness.  Denies bright red blood per rectum.  Has been wheelchair-bound since March and gets total care.  EMS was activated.  In the ED, repeated CBC revealed hemoglobin of 5.2K.  FOBT positive.  1 unit PRBCs was ordered to be transfused by EDP.  A CT angio abdomen and pelvis for GI bleed revealed incidentally found pulmonary embolism and left lower extremity venous thrombosis.  TR[facility], hospitalist service, was asked to admit.  Admitted to stepdown unit as inpatient status.  An additional 1 unit PRBCs was ordered to be transfused.  UA returned positive for pyuria.  Called the patient's son [name] who is also her medical power of attorney to provide updates.  GI has been consulted since the patient's son is considering anticoagulation in the setting of likely GI bleed.  The patient does not want this. Per her son, she has short-term memory loss and cannot make decisions for herself.  He states there are no formal diagnosis of dementia but has had cognitive decline for the past few months.  I have placed a consult for palliative care medicine to assist family with establishing goals of care.   ED Course: Temperature 98.1.  BP 135/55, pulse 89, respiratory rate 13, O2 saturation 100% on 3 L.  Review of Systems: Review of systems as noted in the HPI. All other systems reviewed and are negative.   Past Medical History:  Diagnosis Date   ABDOMINAL PAIN, CHRONIC 05/28/2008    ALLERGIC RHINITIS 09/21/2006   ANEMIA-NOS 09/21/2006   ASTHMA 09/21/2006   ASTHMA 09/21/2006   ASYMPTOMATIC POSTMENOPAUSAL STATUS 05/28/2008   Cataract    CHEST PAIN, ATYPICAL 05/28/2008   COPD (chronic obstructive pulmonary disease) (HCC)    COVID-19 virus infection 02/2020   DDD (degenerative disc disease)    DIVERTICULITIS, HX OF 09/21/2006   GERD 09/21/2006   HYPERCHOLESTEROLEMIA 06/05/2009   HYPERTENSION 09/21/2006   HYPOTHYROIDISM 09/21/2006   Peptic ulcer disease    Purpura (HCC)    RENAL INSUFFICIENCY 09/21/2006   Shingles    Small bowel obstruction (HCC)    Somatization disorder 04/25/2007   UNSPECIFIED URINARY CALCULUS 05/28/2008   UTI (urinary tract infection) 07/2019   UTI (urinary tract infection) 11/08/2021   UTI (urinary tract infection) 02/2022   Past Surgical History:  Procedure Laterality Date   ABDOMINAL HYSTERECTOMY  1981   acute Nephritis  1958   APPENDECTOMY  1959   BIOPSY  11/18/2019   Procedure: BIOPSY;  Surgeon: Meryl Dare, MD;  Location: [facility];  Service: Endoscopy;;   BIOPSY  10/21/2021   Procedure: BIOPSY;  Surgeon: Lemar Lofty., MD;  Location: [facility];  Service: Gastroenterology;;   CESAREAN SECTION     CHOLECYSTECTOMY  1983   COLON SURGERY     DENTAL SURGERY  1970   FLEXIBLE SIGMOIDOSCOPY N/A 11/18/2019   Procedure: FLEXIBLE SIGMOIDOSCOPY;  Surgeon: Meryl Dare, MD;  Location: [facility];  Service: Endoscopy;  Laterality: N/A;   FLEXIBLE SIGMOIDOSCOPY N/A 10/21/2021   Procedure: FLEXIBLE SIGMOIDOSCOPY;  Surgeon: Meridee Score,  Netty Starring., MD;  Location: [facility];  Service: Gastroenterology;  Laterality: N/A;   NASAL SINUS SURGERY  1967   Pulmonary thrombosis     TONSILLECTOMY     TUBAL LIGATION      Social History:  reports that she has never smoked. She has never used smokeless tobacco. She reports that she does not drink alcohol and does not use drugs.   Allergies  Allergen Reactions   [place]ia Nut  (Berthollefia Excelsa) Anaphylaxis   Fluconazole Shortness Of Breath   Metronidazole Shortness Of Breath and Nausea And Vomiting   Pepcid [Famotidine] Shortness Of Breath and Other (See Comments)    Dizziness   Shellfish Allergy Anaphylaxis   Macrobid [Nitrofurantoin]     REACTION: Syncope   Pantoprazole Other (See Comments)    CHEST PAIN    Family History  Problem Relation Age of Onset   Polycystic kidney disease Mother    Pancreatic cancer Mother    Other Father        Schamberg disease   Bladder Cancer Father    Hypertension Father    Marfan syndrome Son    Hemochromatosis Son    Cirrhosis Son    Allergic rhinitis Sister    Other Brother        bone issue as child; multiple fractures but seemed to age out of this   Hemochromatosis Cousin    Arthritis Sister    Colon cancer Neg Hx    Esophageal cancer Neg Hx    Stomach cancer Neg Hx       Prior to Admission medications   Medication Sig Start Date End Date Taking? Authorizing Provider  levothyroxine (SYNTHROID) 200 MCG tablet Take 200 mcg by mouth daily before breakfast.   Yes [provider]  lidocaine 4 % Place 1 patch onto the skin every 12 (twelve) hours. Apply in am remove in pm   Yes [provider]  mesalamine (LIALDA) 1.2 g EC tablet TAKE 2 TABLETS BY MOUTH DAILY WITH BREAKFAST. Patient taking differently: Take 1.2 g by mouth daily with breakfast. 06/28/22  Yes Esterwood, Amy S, PA-C  morphine (MS CONTIN) 30 MG 12 hr tablet Take 30 mg by mouth 2 (two) times daily. 09/01/22  Yes [provider]  polyethylene glycol powder (GLYCOLAX/MIRALAX) 17 GM/SCOOP powder Take 17 g by mouth daily. 09/01/22  Yes [provider]  pregabalin (LYRICA) 100 MG capsule Take 200 mg by mouth 2 (two) times daily. 09/01/22  Yes [provider]  senna (SENOKOT) 8.6 MG TABS tablet Take 2 tablets by mouth 2 (two) times daily. 09/01/22  Yes [provider]  levothyroxine (SYNTHROID) 75 MCG  tablet TAKE 1 TABLET BY MOUTH EVERY DAY Patient not taking: Reported on 09/06/2022 07/06/22   Karie Georges, MD    Physical Exam: BP 134/61   Pulse 91   Temp 98.1 F (36.7 C) (Oral)   Resp 14   Ht 5\' 8"  (1.727 m)   Wt 69 kg   SpO2 100%   BMI 23.13 kg/m   General: 88 y.o. year-old female well developed well nourished in no acute distress.  Alert and oriented x2. Cardiovascular: Regular rate and rhythm with no rubs or gallops.  No thyromegaly or JVD noted.  No lower extremity edema. 2/4 pulses in all 4 extremities. Respiratory: Clear to auscultation with no wheezes or rales. Good inspiratory effort. Abdomen: Soft nontender nondistended with normal bowel sounds x4 quadrants. Muskuloskeletal: No cyanosis, clubbing or edema noted bilaterally Neuro: CN  II-XII intact, strength, sensation, reflexes Skin: No ulcerative lesions noted or rashes Psychiatry: Mood is appropriate for condition and setting          Labs on Admission:  Basic Metabolic Panel: Recent Labs  Lab 09/06/22 2040  NA 133*  K 3.5  CL 101  CO2 26  GLUCOSE 110*  BUN 13  CREATININE 0.47  CALCIUM 8.6*   Liver Function Tests: Recent Labs  Lab 09/06/22 2040  AST 30  ALT 16  ALKPHOS 58  BILITOT 0.2*  PROT 6.2*  ALBUMIN 2.1*   No results for input(s): "LIPASE", "AMYLASE" in the last 168 hours. No results for input(s): "AMMONIA" in the last 168 hours. CBC: Recent Labs  Lab 09/06/22 2040  WBC 10.7*  NEUTROABS 5.9  HGB 5.2*  HCT 17.8*  MCV 74.2*  PLT 772*   Cardiac Enzymes: No results for input(s): "CKTOTAL", "CKMB", "CKMBINDEX", "TROPONINI" in the last 168 hours.  BNP (last 3 results) No results for input(s): "BNP" in the last 8760 hours.  ProBNP (last 3 results) No results for input(s): "PROBNP" in the last 8760 hours.  CBG: No results for input(s): "GLUCAP" in the last 168 hours.  Radiological Exams on Admission: CT Angio Chest Pulmonary Embolism (PE) W or WO Contrast  Result Date:  09/07/2022 CLINICAL DATA:  Left external iliac vein thrombus and right lower lobe pulmonary artery embolus seen on the CT of the abdomen pelvis. EXAM: CT ANGIOGRAPHY CHEST WITH CONTRAST TECHNIQUE: Multidetector CT imaging of the chest was performed using the standard protocol during bolus administration of intravenous contrast. Multiplanar CT image reconstructions and MIPs were obtained to evaluate the vascular anatomy. RADIATION DOSE REDUCTION: This exam was performed according to the departmental dose-optimization program which includes automated exposure control, adjustment of the mA and/or kV according to patient size and/or use of iterative reconstruction technique. CONTRAST:  50mL OMNIPAQUE IOHEXOL 350 MG/ML SOLN COMPARISON:  CT of the abdomen pelvis dated 09/06/2022. FINDINGS: Cardiovascular: There is no cardiomegaly or pericardial effusion. There is coronary vascular calcification. Mild atherosclerotic calcification of the thoracic aorta. No aneurysmal dilatation or dissection. The origins of the great vessels of the aortic arch appear patent. Right lower lobe segmental and subsegmental pulmonary artery embolus as reported on the CT of the abdomen pelvis. No CT evidence of right heart straining. Mediastinum/Nodes: No obvious hilar or mediastinal adenopathy. Evaluation however is limited due to consolidative changes of the adjacent lungs. There is a large hiatal hernia containing a portion of the stomach. No mediastinal fluid collection. Lungs/Pleura: Small bilateral pleural effusions, left greater right. There is associated partial compressive atelectasis of the lower lobes. Pneumonia is not excluded. Left upper lobe and right middle lobe pulmonary nodules measure up to 18 mm in the left upper lobe. There is diffuse interstitial and interlobular septal prominence suggestive of edema. There is no pneumothorax. The central airways are patent. Upper Abdomen: Cholecystectomy. Musculoskeletal: Osteopenia with  degenerative changes of the spine. No acute osseous pathology. Review of the MIP images confirms the above findings. IMPRESSION: 1. Right lower lobe segmental and subsegmental pulmonary artery embolus as reported on the CT of abdomen pelvis. No CT evidence of right heart straining. 2. Small bilateral pleural effusions, left greater right, with associated partial compressive atelectasis of the lower lobes. Pneumonia is not excluded. 3. Bilateral pulmonary nodules measure up to 18 mm in the left upper lobe. Non-contrast chest CT at 3-6 months is recommended. If the nodules are stable at time of repeat CT, then future CT  at 18-24 months (from today's scan) is considered optional for low-risk patients, but is recommended for high-risk patients. This recommendation follows the consensus statement: Guidelines for Management of Incidental Pulmonary Nodules Detected on CT Images: From the Fleischner Society 2017; Radiology 2017; 284:228-243. 4. Large hiatal hernia. 5.  Aortic Atherosclerosis (ICD10-I70.0). Electronically Signed   By: Elgie Collard M.D.   On: 09/07/2022 00:37   CT Angio Abd/Pel W and/or Wo Contrast  Result Date: 09/06/2022 CLINICAL DATA:  Decreased hemoglobin EXAM: CTA ABDOMEN AND PELVIS WITHOUT AND WITH CONTRAST TECHNIQUE: Multidetector CT imaging of the abdomen and pelvis was performed using the standard protocol during bolus administration of intravenous contrast. Multiplanar reconstructed images and MIPs were obtained and reviewed to evaluate the vascular anatomy. RADIATION DOSE REDUCTION: This exam was performed according to the departmental dose-optimization program which includes automated exposure control, adjustment of the mA and/or kV according to patient size and/or use of iterative reconstruction technique. CONTRAST:  OMNIPAQUE IOHEXOL 350 MG/ML SOLN COMPARISON:  10/19/2021 FINDINGS: VASCULAR Aorta: Normal caliber aorta without aneurysm, dissection, vasculitis or significant  stenosis. Atherosclerosis. Celiac: High-grade stenosis at the origin of the celiac artery estimated greater than 90%. Distal branches of the celiac artery remain widely patent. No aneurysm, dissection, or vasculitis. SMA: Patent without evidence of aneurysm, dissection, vasculitis or significant stenosis. Renals: Both renal arteries are patent without evidence of aneurysm, dissection, vasculitis, fibromuscular dysplasia or significant stenosis. IMA: Patent without evidence of aneurysm, dissection, vasculitis or significant stenosis. Inflow: Patent without evidence of aneurysm, dissection, vasculitis or significant stenosis. Proximal Outflow: Bilateral common femoral and visualized portions of the superficial and profunda femoral arteries are patent without evidence of aneurysm, dissection, vasculitis or significant stenosis. Veins: Suspected nonocclusive DVT within the left external iliac vein, reference image 76/18. Otherwise unremarkable. Review of the MIP images confirms the above findings. NON-VASCULAR Lower chest: On the arterial phase images, there is evidence of incidental right lower lobe segmental pulmonary embolus. No other filling defects. There are bilateral pleural effusions, left greater than right, with dependent lower lobe atelectasis. Hepatobiliary: No focal liver abnormality is seen. Status post cholecystectomy. No biliary dilatation. Pancreas: Unremarkable. No pancreatic ductal dilatation or surrounding inflammatory changes. Spleen: Normal in size without focal abnormality. Adrenals/Urinary Tract: Adrenal glands are unremarkable. Kidneys are normal, without renal calculi, focal lesion, or hydronephrosis. Bladder is decompressed with a Foley catheter, limiting its evaluation. Stomach/Bowel: No bowel obstruction or ileus. Cecum is once again noted within the left lower quadrant, which may be postsurgical. No bowel wall thickening or inflammatory change. Large hiatal hernia. Distal colonic  diverticulosis without diverticulitis. High attenuation stool throughout the colon limits evaluation for underlying hemorrhage. There is no intraluminal contrast accumulation within the bowel to suggest active gastrointestinal hemorrhage on this study. Lymphatic: No pathologic adenopathy within the abdomen or pelvis. Reproductive: Status post hysterectomy. No adnexal masses. Other: No free fluid or free intraperitoneal gas. Fat containing midline supraumbilical ventral hernia. No bowel herniation. Musculoskeletal: No acute or destructive bony abnormalities. Stable severe right hip osteoarthritis with reactive joint effusion. Reconstructed images demonstrate no additional findings. IMPRESSION: VASCULAR 1. No evidence of active gastrointestinal hemorrhage, though evaluation is limited due to the presence of high attenuation stool throughout the colon. 2. Incidental nonocclusive left external iliac vein thrombus. 3. Incidental right lower lobe segmental pulmonary embolus. 4. Stable high-grade stenosis at the origin of the celiac artery. 5.  Aortic Atherosclerosis (ICD10-I70.0). NON-VASCULAR 1. Bilateral pleural effusions and dependent lower lobe atelectasis, left greater than right. 2. Distal  colonic diverticulosis without diverticulitis. 3. Stable hiatal hernia. Critical Value/emergent results were called by telephone at the time of interpretation on 09/06/2022 at 10:40 pm to provider DR Julieanne Manson, who verbally acknowledged these results. Electronically Signed   By: Sharlet Salina M.D.   On: 09/06/2022 22:40    EKG: I independently viewed the EKG done and my findings are as followed: Sinus rhythm rate of 87.  Nonspecific ST-T changes.  QTc 462.  Assessment/Plan Present on Admission:  Symptomatic anemia  Principal Problem:   Symptomatic anemia  Symptomatic anemia Sent to the ED for hemoglobin of 4.8 K and generalized weakness Hemoglobin in the ED 5.2 K Total of 2 unit PRBCs ordered to be transfused. Repeat  CBC post blood transfusion Closely monitor vital signs and maintain MAP greater than 65   Acute blood loss anemia Suspect GI source, possibly upper Positive FOBT with hemoglobin of 5.2 K on presentation Did not have bright red blood per rectum CTA abdomen and pelvis did not show active GI bleeding Start IV Protonix 40 mg twice daily [facility] consulted, Dr. Rhea Belton N.p.o. after midnight until seen by GI.  Left lower extremity thrombosis, seen on CT angio abdomen and pelvis Follow bilateral Doppler ultrasound No SCDs due to VTE.  Ulcerative colitis on mesalamine Resume home regimen Denies recent overt GI bleed with bright red blood per rectum.  Chronic pain syndrome Resume home regimen  Hypothyroidism Resume home levothyroxine.  Chronic constipation Resume home bowel regimen  Goals of care Palliative care medicine consulted to assist with establishing goals of care. Currently the patient is DNR Medical power of attorney is her son [name]  Presumptive UTI, POA UA positive for pyuria Prior urine culture positive for Staph epidermidis Currently afebrile with no leukocytosis Augmentin initiated.   Time: 75 minutes.    DVT prophylaxis: No pharmacological DVT prophylaxis due to concern for GI bleed.  No SCDs due to concern for lower extremity venous thrombosis.   Code Status: DNR  Family Communication: Updated the patient's son who is also medical power of attorney [name].  Disposition Plan: Admitted to stepdown unit.  Consults called: GI and palliative care medicine consulted.  Admission status: Inpatient status.   Status is: Inpatient The patient requires at least 2 midnights for further evaluation and treatment of present condition.   Darlin Drop MD [facility] Pager [phone]  If 7PM-7AM, please contact night-coverage [email or web address] Password [email or web address]  09/07/2022, 2:25 AM

## 2022-09-07 NOTE — Progress Notes (Signed)
Bilateral lower extremity venous duplex has been completed. Preliminary results can be found in CV Proc through chart review.  Results were given to the patient's nurse, Amber.  09/07/22 9:29 AM Olen Cordial RVT

## 2022-09-07 NOTE — Discharge Summary (Signed)
Physician Discharge Summary   Patient: [name] MRN: [id number] DOB: 09/16/1934  Admit date:     09/06/2022  Discharge date: 09/07/22  Discharge Physician: Thad Ranger, MD    PCP: System, Provider Not In   Recommendations at discharge:   Comfort care  Discharge Diagnoses:  Acute blood loss anemia/symptomatic anemia   Hypothyroidism   HYPERCHOLESTEROLEMIA   Essential hypertension   GERD   Iron deficiency anemia   Ulcerative colitis (HCC)   Rectal bleeding   GI bleed   Acute pulmonary embolism [facility])     Hospital Course:   Patient is a 88 year old female with ulcerative colitis on mesalamine, hypothyroidism, chronic pain syndrome on MS Contin, chronic constipation, presented from SNF due to abnormal labs, hemoglobin of 4.8, and generalized weakness.  Denied any active GI bleeding.  Has been wheelchair-bound since March and gets total care.  EMS was activated.  In the ED, repeated CBC revealed hemoglobin of 5.2, FOBT positive.    CT angio abdomen and pelvis for GI bleed revealed incidentally found pulmonary embolism and left lower extremity venous thrombosis.   UA returned positive for pyuria. Received 2 units packed RBCs. Of note, no formal diagnosis of dementia however per patient's son she has had cognitive decline for the past few months  Assessment and Plan:  Acute blood loss anemia/symptomatic anemia, known history of GERD, iron deficiency anemia, in the setting of ulcerative colitis on mesalamine -Hemoglobin 5.2 on admission, FOBT positive.  Baseline hemoglobin 9-10 -Received 2 units packed RBCs, hemoglobin 7.9. -GI was consulted and recommended palliative goals of care, no plans for endoscopic intervention at this time.  Patient was placed on Protonix. -With diagnosis of new pulmonary embolism which will require anticoagulation, palliative medicine consulted for goals of care.   -CTA abdomen pelvis did not show active bleeding, large hiatal hernia      Acute  pulmonary embolism (HCC), chronic left femoral vein DVT -CTA chest showed right lower lobe segmental and subsegmental pulmonary artery embolus.  No CT evidence of right heart strain.  Small bilateral pleural effusions left greater than right with associated partial compressive atelectasis of lower lobes. -Venous Doppler lower extremity showed chronic DVT in the the left femoral vein -Palliative medicine was consulted for goals of care.  Palliative discussed with patient's H POA/son, requested comfort care status     Hypothyroidism -Continue levothyroxine    Ulcerative colitis (HCC) -Continue mesalamine     Essential hypertension -BP currently stable   Chronic pain syndrome -Continue pain regimen    Bilateral pulmonary nodules -CTA chest showed bilateral pulmonary nodules up to 18 mm in the left upper lobe, noncontrast chest CT at 3 to 6 months recommended.   Chronic constipation -Continue home bowel regimen   Pyuria -Patient was placed on Augmentin on admission, DC antibiotics     Pressure Injury Documentation: -Right sacrum, POA -Left sacrum, POA -Right and left buttocks, unstageable, POA     Estimated body mass index is 24.14 kg/m as calculated from the following:   Height as of this encounter: 5\' 7"  (1.702 m).   Weight as of this encounter: 69.9 kg.       Pain control - [employer] Controlled Substance Reporting System database was reviewed. and patient was instructed, not to drive, operate heavy machinery, perform activities at heights, swimming or participation in water activities or provide baby-sitting services while on Pain, Sleep and Anxiety Medications; until their outpatient Physician has advised to do so again. Also recommended to not  to take more than prescribed Pain, Sleep and Anxiety Medications.  Consultants: GI, palliative medicine Procedures performed: None Disposition: Skilled nursing facility with comfort care Diet recommendation:  Discharge Diet  Orders (From admission, onward)     Start     Ordered   09/07/22 0000  Diet general        09/07/22 1407            DISCHARGE MEDICATION: Allergies as of 09/07/2022       Reactions   [place] Nut (berthollefia Excelsa) Anaphylaxis   Fluconazole Shortness Of Breath   Metronidazole Shortness Of Breath, Nausea And Vomiting   Pepcid [famotidine] Shortness Of Breath, Other (See Comments)   Dizziness   Shellfish Allergy Anaphylaxis   Macrobid [nitrofurantoin]    REACTION: Syncope   Pantoprazole Other (See Comments)   CHEST PAIN        Medication List     TAKE these medications    haloperidol 0.5 MG tablet Commonly known as: HALDOL Take 1 tablet (0.5 mg total) by mouth every 4 (four) hours as needed for agitation (or delirium).   leptospermum manuka honey Pste paste Apply 1 Application topically daily. Apply 1/4" thick layer of Medihoney to all wounds on the sacrum and buttocks daily  Apply thin layer (3 mm) to wound. Start taking on: September 08, 2022   levothyroxine 200 MCG tablet Commonly known as: SYNTHROID Take 200 mcg by mouth daily before breakfast. What changed: Another medication with the same name was removed. Continue taking this medication, and follow the directions you see here.   lidocaine 4 % Place 1 patch onto the skin every 12 (twelve) hours. Apply in am remove in pm   mesalamine 1.2 g EC tablet Commonly known as: LIALDA TAKE 2 TABLETS BY MOUTH DAILY WITH BREAKFAST. What changed: how much to take   morphine 30 MG 12 hr tablet Commonly known as: MS CONTIN Take 1 tablet (30 mg total) by mouth 2 (two) times daily.   ondansetron 4 MG disintegrating tablet Commonly known as: ZOFRAN-ODT Take 1 tablet (4 mg total) by mouth every 6 (six) hours as needed for nausea.   polyethylene glycol powder 17 GM/SCOOP powder Commonly known as: GLYCOLAX/MIRALAX Take 17 g by mouth daily.   polyvinyl alcohol 1.4 % ophthalmic solution Commonly known as: LIQUIFILM  TEARS Place 1 drop into both eyes 4 (four) times daily as needed for dry eyes.   pregabalin 100 MG capsule Commonly known as: LYRICA Take 200 mg by mouth 2 (two) times daily.   senna 8.6 MG Tabs tablet Commonly known as: SENOKOT Take 2 tablets by mouth 2 (two) times daily.               Discharge Care Instructions  (From admission, onward)           Start     Ordered   09/07/22 0000  Discharge wound care:       Comments: Wound care  Daily      Comments: Cleanse sacral and buttock wounds with saline, pat dry Apply 1/4" thick layer of Medihoney to all wounds, top with dry dressing Change daily   09/07/22 1407            Discharge Exam: Filed Weights   09/06/22 2015 09/07/22 0331  Weight: 69 kg 69.9 kg   S: No acute complaints is somewhat lethargic this morning, although following commands.  Ongoing GI bleeding.  BP (!) 114/49   Pulse 78   Temp 98.4  F (36.9 C) (Axillary)   Resp 12   Ht 5\' 7"  (1.702 m)   Wt 69.9 kg   SpO2 100%   BMI 24.14 kg/m   Physical Exam General: Lethargic but following verbal commands.  Easily arousable Cardiovascular: S1 S2 clear, RRR.  Respiratory: CTAB, no wheezing Gastrointestinal: Soft, nontender, nondistended, NBS Ext: no pedal edema bilaterally Neuro: no new deficits    Condition at discharge:  comfort care   The results of significant diagnostics from this hospitalization (including imaging, microbiology, ancillary and laboratory) are listed below for reference.   Imaging Studies: VAS [place] LOWER EXTREMITY VENOUS (DVT)  Result Date: 09/07/2022  Lower Venous DVT Study Patient Name:  MARAYAH DICRISTINA  Date of Exam:   09/07/2022 Medical Rec #: [id number]     Accession #:    [id number] Date of Birth: 06-07-1934      Patient Gender: F Patient Age:   41 years Exam Location:  [facility] Procedure:      VAS [place] LOWER EXTREMITY VENOUS (DVT) Referring Phys: Dow Adolph  --------------------------------------------------------------------------------  Indications: Pulmonary embolism.  Risk Factors: Confirmed PE. Limitations: Poor ultrasound/tissue interface and poor patient positioning, patient immobility, patient pain tolerance. Comparison Study: No prior studies. Performing Technologist: Chanda Busing RVT  Examination Guidelines: A complete evaluation includes B-mode imaging, spectral Doppler, color Doppler, and power Doppler as needed of all accessible portions of each vessel. Bilateral testing is considered an integral part of a complete examination. Limited examinations for reoccurring indications may be performed as noted. The reflux portion of the exam is performed with the patient in reverse Trendelenburg.  +---------+---------------+---------+-----------+----------+-------------------+ RIGHT    CompressibilityPhasicitySpontaneityPropertiesThrombus Aging      +---------+---------------+---------+-----------+----------+-------------------+ CFV      Full           Yes      Yes                                      +---------+---------------+---------+-----------+----------+-------------------+ SFJ      Full                                                             +---------+---------------+---------+-----------+----------+-------------------+ FV Prox  Full                                                             +---------+---------------+---------+-----------+----------+-------------------+ FV Mid   Full                                                             +---------+---------------+---------+-----------+----------+-------------------+ FV Distal               Yes      Yes                                      +---------+---------------+---------+-----------+----------+-------------------+  POP      Full           Yes      Yes                                       +---------+---------------+---------+-----------+----------+-------------------+ PTV      Full                                                             +---------+---------------+---------+-----------+----------+-------------------+ PERO                                                  Not well visualized +---------+---------------+---------+-----------+----------+-------------------+   +---------+---------------+---------+-----------+----------+--------------+ LEFT     CompressibilityPhasicitySpontaneityPropertiesThrombus Aging +---------+---------------+---------+-----------+----------+--------------+ CFV      Full           Yes      Yes                                 +---------+---------------+---------+-----------+----------+--------------+ SFJ      Full                                                        +---------+---------------+---------+-----------+----------+--------------+ FV Prox  Partial        Yes      Yes                  Chronic        +---------+---------------+---------+-----------+----------+--------------+ FV Mid   Full                                                        +---------+---------------+---------+-----------+----------+--------------+ FV DistalFull                                                        +---------+---------------+---------+-----------+----------+--------------+ PFV      Full                                                        +---------+---------------+---------+-----------+----------+--------------+ POP                     Yes      Yes                                 +---------+---------------+---------+-----------+----------+--------------+  PTV      Full                                                        +---------+---------------+---------+-----------+----------+--------------+ PERO     Full                                                         +---------+---------------+---------+-----------+----------+--------------+    Summary: RIGHT: - There is no evidence of deep vein thrombosis in the lower extremity. However, portions of this examination were limited- see technologist comments above.  - No cystic structure found in the popliteal fossa.  LEFT: - Findings consistent with chronic deep vein thrombosis involving the left femoral vein. - No cystic structure found in the popliteal fossa.  *See table(s) above for measurements and observations.    Preliminary    CT Angio Chest Pulmonary Embolism (PE) W or WO Contrast  Result Date: 09/07/2022 CLINICAL DATA:  Left external iliac vein thrombus and right lower lobe pulmonary artery embolus seen on the CT of the abdomen pelvis. EXAM: CT ANGIOGRAPHY CHEST WITH CONTRAST TECHNIQUE: Multidetector CT imaging of the chest was performed using the standard protocol during bolus administration of intravenous contrast. Multiplanar CT image reconstructions and MIPs were obtained to evaluate the vascular anatomy. RADIATION DOSE REDUCTION: This exam was performed according to the departmental dose-optimization program which includes automated exposure control, adjustment of the mA and/or kV according to patient size and/or use of iterative reconstruction technique. CONTRAST:  50mL OMNIPAQUE IOHEXOL 350 MG/ML SOLN COMPARISON:  CT of the abdomen pelvis dated 09/06/2022. FINDINGS: Cardiovascular: There is no cardiomegaly or pericardial effusion. There is coronary vascular calcification. Mild atherosclerotic calcification of the thoracic aorta. No aneurysmal dilatation or dissection. The origins of the great vessels of the aortic arch appear patent. Right lower lobe segmental and subsegmental pulmonary artery embolus as reported on the CT of the abdomen pelvis. No CT evidence of right heart straining. Mediastinum/Nodes: No obvious hilar or mediastinal adenopathy. Evaluation however is limited due to consolidative changes of  the adjacent lungs. There is a large hiatal hernia containing a portion of the stomach. No mediastinal fluid collection. Lungs/Pleura: Small bilateral pleural effusions, left greater right. There is associated partial compressive atelectasis of the lower lobes. Pneumonia is not excluded. Left upper lobe and right middle lobe pulmonary nodules measure up to 18 mm in the left upper lobe. There is diffuse interstitial and interlobular septal prominence suggestive of edema. There is no pneumothorax. The central airways are patent. Upper Abdomen: Cholecystectomy. Musculoskeletal: Osteopenia with degenerative changes of the spine. No acute osseous pathology. Review of the MIP images confirms the above findings. IMPRESSION: 1. Right lower lobe segmental and subsegmental pulmonary artery embolus as reported on the CT of abdomen pelvis. No CT evidence of right heart straining. 2. Small bilateral pleural effusions, left greater right, with associated partial compressive atelectasis of the lower lobes. Pneumonia is not excluded. 3. Bilateral pulmonary nodules measure up to 18 mm in the left upper lobe. Non-contrast chest CT at 3-6 months is recommended. If the nodules are stable at time of repeat CT, then future CT at 18-24 months (from  today's scan) is considered optional for low-risk patients, but is recommended for high-risk patients. This recommendation follows the consensus statement: Guidelines for Management of Incidental Pulmonary Nodules Detected on CT Images: From the Fleischner Society 2017; Radiology 2017; 284:228-243. 4. Large hiatal hernia. 5.  Aortic Atherosclerosis (ICD10-I70.0). Electronically Signed   By: Elgie Collard M.D.   On: 09/07/2022 00:37   CT Angio Abd/Pel W and/or Wo Contrast  Result Date: 09/06/2022 CLINICAL DATA:  Decreased hemoglobin EXAM: CTA ABDOMEN AND PELVIS WITHOUT AND WITH CONTRAST TECHNIQUE: Multidetector CT imaging of the abdomen and pelvis was performed using the standard protocol  during bolus administration of intravenous contrast. Multiplanar reconstructed images and MIPs were obtained and reviewed to evaluate the vascular anatomy. RADIATION DOSE REDUCTION: This exam was performed according to the departmental dose-optimization program which includes automated exposure control, adjustment of the mA and/or kV according to patient size and/or use of iterative reconstruction technique. CONTRAST:  OMNIPAQUE IOHEXOL 350 MG/ML SOLN COMPARISON:  10/19/2021 FINDINGS: VASCULAR Aorta: Normal caliber aorta without aneurysm, dissection, vasculitis or significant stenosis. Atherosclerosis. Celiac: High-grade stenosis at the origin of the celiac artery estimated greater than 90%. Distal branches of the celiac artery remain widely patent. No aneurysm, dissection, or vasculitis. SMA: Patent without evidence of aneurysm, dissection, vasculitis or significant stenosis. Renals: Both renal arteries are patent without evidence of aneurysm, dissection, vasculitis, fibromuscular dysplasia or significant stenosis. IMA: Patent without evidence of aneurysm, dissection, vasculitis or significant stenosis. Inflow: Patent without evidence of aneurysm, dissection, vasculitis or significant stenosis. Proximal Outflow: Bilateral common femoral and visualized portions of the superficial and profunda femoral arteries are patent without evidence of aneurysm, dissection, vasculitis or significant stenosis. Veins: Suspected nonocclusive DVT within the left external iliac vein, reference image 76/18. Otherwise unremarkable. Review of the MIP images confirms the above findings. NON-VASCULAR Lower chest: On the arterial phase images, there is evidence of incidental right lower lobe segmental pulmonary embolus. No other filling defects. There are bilateral pleural effusions, left greater than right, with dependent lower lobe atelectasis. Hepatobiliary: No focal liver abnormality is seen. Status post cholecystectomy. No  biliary dilatation. Pancreas: Unremarkable. No pancreatic ductal dilatation or surrounding inflammatory changes. Spleen: Normal in size without focal abnormality. Adrenals/Urinary Tract: Adrenal glands are unremarkable. Kidneys are normal, without renal calculi, focal lesion, or hydronephrosis. Bladder is decompressed with a Foley catheter, limiting its evaluation. Stomach/Bowel: No bowel obstruction or ileus. Cecum is once again noted within the left lower quadrant, which may be postsurgical. No bowel wall thickening or inflammatory change. Large hiatal hernia. Distal colonic diverticulosis without diverticulitis. High attenuation stool throughout the colon limits evaluation for underlying hemorrhage. There is no intraluminal contrast accumulation within the bowel to suggest active gastrointestinal hemorrhage on this study. Lymphatic: No pathologic adenopathy within the abdomen or pelvis. Reproductive: Status post hysterectomy. No adnexal masses. Other: No free fluid or free intraperitoneal gas. Fat containing midline supraumbilical ventral hernia. No bowel herniation. Musculoskeletal: No acute or destructive bony abnormalities. Stable severe right hip osteoarthritis with reactive joint effusion. Reconstructed images demonstrate no additional findings. IMPRESSION: VASCULAR 1. No evidence of active gastrointestinal hemorrhage, though evaluation is limited due to the presence of high attenuation stool throughout the colon. 2. Incidental nonocclusive left external iliac vein thrombus. 3. Incidental right lower lobe segmental pulmonary embolus. 4. Stable high-grade stenosis at the origin of the celiac artery. 5.  Aortic Atherosclerosis (ICD10-I70.0). NON-VASCULAR 1. Bilateral pleural effusions and dependent lower lobe atelectasis, left greater than right. 2. Distal colonic diverticulosis without diverticulitis.  3. Stable hiatal hernia. Critical Value/emergent results were called by telephone at the time of  interpretation on 09/06/2022 at 10:40 pm to provider DR Julieanne Manson, who verbally acknowledged these results. Electronically Signed   By: Sharlet Salina M.D.   On: 09/06/2022 22:40    Microbiology: Results for orders placed or performed during the hospital encounter of 09/06/22  MRSA Next Gen by PCR, Nasal     Status: Abnormal   Collection Time: 09/07/22  4:25 AM   Specimen: Nasal Mucosa; Nasal Swab  Result Value Ref Range Status   MRSA by PCR Next Gen DETECTED (A) NOT DETECTED Final    Comment: (NOTE) The GeneXpert MRSA Assay (FDA approved for NASAL specimens only), is one component of a comprehensive MRSA colonization surveillance program. It is not intended to diagnose MRSA infection nor to guide or monitor treatment for MRSA infections. Test performance is not FDA approved in patients less than 29 years old. Performed at [facility], [address], [place], [place] [address]     Labs: CBC: Recent Labs  Lab 09/06/22 2040 09/07/22 0356 09/07/22 0723  WBC 10.7* 11.4*  --   NEUTROABS 5.9  --   --   HGB 5.2* 7.9* 7.9*  HCT 17.8* 26.7* 26.6*  MCV 74.2* 80.4  --   PLT 772* 638*  --    Basic Metabolic Panel: Recent Labs  Lab 09/06/22 2040 09/07/22 0356  NA 133* 132*  K 3.5 4.0  CL 101 101  CO2 26 22  GLUCOSE 110* 100*  BUN 13 11  CREATININE 0.47 0.32*  CALCIUM 8.6* 8.7*  MG  --  1.8  PHOS  --  3.1   Liver Function Tests: Recent Labs  Lab 09/06/22 2040 09/07/22 0356  AST 30 30  ALT 16 14  ALKPHOS 58 53  BILITOT 0.2* 1.2  PROT 6.2* 6.0*  ALBUMIN 2.1* 2.1*   CBG: No results for input(s): "GLUCAP" in the last 168 hours.  Discharge time spent: greater than 30 minutes.  Signed: Thad Ranger, MD [facility] 09/07/2022

## 2022-09-07 NOTE — Consult Note (Addendum)
Consultation  Referring Provider: [facility]/ Margo Aye Primary Care Physician:  System, Provider Not In Primary Gastroenterologist:  Dr.Nandigam   Reason for Consultation: Profound anemia, history of proctosigmoiditis, new DVT and PE  HPI: [name] is a 88 y.o. female, established with Dr. Lavon Paganini who has history of proctosigmoiditis.  She last had evaluation of the colon in September 2023 when she was hospitalized with hematochezia and anemia.  Had flexible sigmoidoscopy with poor prep but was noted to have moderate proctosigmoiditis/Mayo score 2 and nonbleeding internal hemorrhoids. He has been managed over time primarily with Lialda and currently on 2.4 g daily.  She was last seen in our office in February and at that time she was continued on 4.8 g of Lialda.  She had taken budesonide in the past as well as steroids, had tapered off of budesonide several months ago. Per her son and patient she has not been having any obvious rectal bleeding and not having any complaints of abdominal pain rectal pain or diarrhea. Does have issues with chronic anemia.  Hemoglobin had been checked last in March 2024 and at that time was 8.9. He has not been doing well overall over the past 4 months, has been bed ridden secondary to chronic pain and was just placed in a nursing facility about a week ago. She had routine labs checked on 09/06/2022 with finding of hemoglobin 5.2/hematocrit 17/MCV of 74/platelets 772 Potassium 3.5/BUN 13/creatinine 0.47 and albumin 2.1.  She was brought to the hospital, has had transfusions x 2 and hemoglobin 7.9 this morning.  Was also incidentally found on CT imaging yesterday to have high-grade stenosis of the celiac at the origin, SMA patent, did high attenuation stool throughout the colon, no suggestion of active GI bleeding, hiatal hernia, distal colonic diverticulosis no diverticulitis and no bowel wall thickening Incidental nonocclusive left external iliac vein thrombosis and  right lower lobe segmental PE  Patient has no complaints of chest pain or shortness of breath, she is comfortable this morning, she has been a hospice patient outpatient recently primarily for control of her chronic pain.  Son at bedside today     Past Medical History:  Diagnosis Date   ABDOMINAL PAIN, CHRONIC 05/28/2008   ALLERGIC RHINITIS 09/21/2006   ANEMIA-NOS 09/21/2006   ASTHMA 09/21/2006   ASTHMA 09/21/2006   ASYMPTOMATIC POSTMENOPAUSAL STATUS 05/28/2008   Cataract    CHEST PAIN, ATYPICAL 05/28/2008   COPD (chronic obstructive pulmonary disease) (HCC)    COVID-19 virus infection 02/2020   DDD (degenerative disc disease)    DIVERTICULITIS, HX OF 09/21/2006   GERD 09/21/2006   HYPERCHOLESTEROLEMIA 06/05/2009   HYPERTENSION 09/21/2006   HYPOTHYROIDISM 09/21/2006   Peptic ulcer disease    Purpura (HCC)    RENAL INSUFFICIENCY 09/21/2006   Shingles    Small bowel obstruction (HCC)    Somatization disorder 04/25/2007   UNSPECIFIED URINARY CALCULUS 05/28/2008   UTI (urinary tract infection) 07/2019   UTI (urinary tract infection) 11/08/2021   UTI (urinary tract infection) 02/2022    Past Surgical History:  Procedure Laterality Date   ABDOMINAL HYSTERECTOMY  1981   acute Nephritis  1958   APPENDECTOMY  1959   BIOPSY  11/18/2019   Procedure: BIOPSY;  Surgeon: Meryl Dare, MD;  Location: [facility];  Service: Endoscopy;;   BIOPSY  10/21/2021   Procedure: BIOPSY;  Surgeon: Lemar Lofty., MD;  Location: [facility];  Service: Gastroenterology;;   CESAREAN SECTION     CHOLECYSTECTOMY  [phone]  COLON SURGERY     DENTAL SURGERY  1970   FLEXIBLE SIGMOIDOSCOPY N/A 11/18/2019   Procedure: FLEXIBLE SIGMOIDOSCOPY;  Surgeon: Meryl Dare, MD;  Location: [facility];  Service: Endoscopy;  Laterality: N/A;   FLEXIBLE SIGMOIDOSCOPY N/A 10/21/2021   Procedure: FLEXIBLE SIGMOIDOSCOPY;  Surgeon: Meridee Score Netty Starring., MD;  Location: [facility];  Service:  Gastroenterology;  Laterality: N/A;   NASAL SINUS SURGERY  1967   Pulmonary thrombosis     TONSILLECTOMY     TUBAL LIGATION      Prior to Admission medications   Medication Sig Start Date End Date Taking? Authorizing Provider  levothyroxine (SYNTHROID) 200 MCG tablet Take 200 mcg by mouth daily before breakfast.   Yes [provider]  lidocaine 4 % Place 1 patch onto the skin every 12 (twelve) hours. Apply in am remove in pm   Yes [provider]  mesalamine (LIALDA) 1.2 g EC tablet TAKE 2 TABLETS BY MOUTH DAILY WITH BREAKFAST. Patient taking differently: Take 1.2 g by mouth daily with breakfast. 06/28/22  Yes Esterwood, Amy S, PA-C  morphine (MS CONTIN) 30 MG 12 hr tablet Take 30 mg by mouth 2 (two) times daily. 09/01/22  Yes [provider]  polyethylene glycol powder (GLYCOLAX/MIRALAX) 17 GM/SCOOP powder Take 17 g by mouth daily. 09/01/22  Yes [provider]  pregabalin (LYRICA) 100 MG capsule Take 200 mg by mouth 2 (two) times daily. 09/01/22  Yes [provider]  senna (SENOKOT) 8.6 MG TABS tablet Take 2 tablets by mouth 2 (two) times daily. 09/01/22  Yes [provider]  levothyroxine (SYNTHROID) 75 MCG tablet TAKE 1 TABLET BY MOUTH EVERY DAY Patient not taking: Reported on 09/06/2022 07/06/22   Karie Georges, MD    Current Facility-Administered Medications  Medication Dose Route Frequency Provider Last Rate Last Admin   0.9 %  sodium chloride infusion (Manually program via Guardrails IV Fluids)   Intravenous Once Darlin Drop, DO       acetaminophen (TYLENOL) tablet 650 mg  650 mg Oral Q6H PRN Dow Adolph N, DO       amoxicillin-clavulanate (AUGMENTIN) 875-125 MG per tablet 1 tablet  1 tablet Oral Q12H Dow Adolph N, DO   1 tablet at 09/07/22 0401   Chlorhexidine Gluconate Cloth 2 % PADS 6 each  6 each Topical Daily Darlin Drop, DO   6 each at 09/07/22 0315   Gerhardt's butt cream   Topical BID Rai, Delene Ruffini, MD   Given  at 09/07/22 1024   HYDROmorphone (DILAUDID) injection 0.5 mg  0.5 mg Intravenous Once PRN Darlin Drop, DO       leptospermum manuka honey (MEDIHONEY) paste 1 Application  1 Application Topical Daily Rai, Ripudeep K, MD   1 Application at 09/07/22 1022   levothyroxine (SYNTHROID) tablet 200 mcg  200 mcg Oral Q0600 Dow Adolph N, DO       lidocaine (LIDODERM) 5 % 1 patch  1 patch Transdermal Daily Dow Adolph N, DO   1 patch at 09/07/22 1021   melatonin tablet 5 mg  5 mg Oral QHS PRN Darlin Drop, DO       mesalamine (LIALDA) EC tablet 2.4 g  2.4 g Oral Q breakfast Hall, Carole N, DO       morphine (MS CONTIN) 12 hr tablet 15 mg  15 mg Oral BID Rai, Ripudeep K, MD       morphine (PF) 2 MG/ML injection 1 mg  1  mg Intravenous Q4H PRN Rai, Ripudeep K, MD   1 mg at 09/07/22 1034   Oral care mouth rinse  15 mL Mouth Rinse PRN Rai, Ripudeep K, MD       oxyCODONE (Oxy IR/ROXICODONE) immediate release tablet 5 mg  5 mg Oral Q6H PRN Margo Aye, Carole N, DO       pantoprazole (PROTONIX) injection 40 mg  40 mg Intravenous BID Hall, Carole N, DO   40 mg at 09/07/22 1020   polyethylene glycol (MIRALAX / GLYCOLAX) packet 17 g  17 g Oral Daily PRN Dow Adolph N, DO       polyethylene glycol (MIRALAX / GLYCOLAX) packet 17 g  17 g Oral Daily Dow Adolph N, DO   17 g at 09/07/22 1021   pregabalin (LYRICA) capsule 200 mg  200 mg Oral BID Dow Adolph N, DO   200 mg at 09/07/22 1021   prochlorperazine (COMPAZINE) injection 5 mg  5 mg Intravenous Q6H PRN Dow Adolph N, DO   5 mg at 09/07/22 0215   senna (SENOKOT) tablet 17.2 mg  2 tablet Oral BID Dow Adolph N, DO        Allergies as of 09/06/2022 - Review Complete 09/06/2022  Allergen Reaction Noted   Estonia nut (berthollefia excelsa) Anaphylaxis 06/04/2019   Fluconazole Shortness Of Breath 02/23/2017   Metronidazole Shortness Of Breath and Nausea And Vomiting 10/07/2013   Pepcid [famotidine] Shortness Of Breath and Other (See Comments) 10/07/2013    Shellfish allergy Anaphylaxis 06/04/2019   Macrobid [nitrofurantoin]     Pantoprazole Other (See Comments) 10/30/2013    Family History  Problem Relation Age of Onset   Polycystic kidney disease Mother    Pancreatic cancer Mother    Other Father        Schamberg disease   Bladder Cancer Father    Hypertension Father    Marfan syndrome Son    Hemochromatosis Son    Cirrhosis Son    Allergic rhinitis Sister    Other Brother        bone issue as child; multiple fractures but seemed to age out of this   Hemochromatosis Cousin    Arthritis Sister    Colon cancer Neg Hx    Esophageal cancer Neg Hx    Stomach cancer Neg Hx     Social History   Socioeconomic History   Marital status: Divorced    Spouse name: Not on file   Number of children: 3   Years of education: Not on file   Highest education level: Not on file  Occupational History   Occupation: retired  Tobacco Use   Smoking status: Never   Smokeless tobacco: Never  Vaping Use   Vaping status: Never Used  Substance and Sexual Activity   Alcohol use: No   Drug use: No   Sexual activity: Not Currently  Other Topics Concern   Not on file  Social History Narrative   Not on file   Social Determinants of Health   Financial Resource Strain: Low Risk  (01/24/2022)   Overall Financial Resource Strain (CARDIA)    Difficulty of Paying Living Expenses: Not hard at all  Food Insecurity: No Food Insecurity (01/24/2022)   Hunger Vital Sign    Worried About Running Out of Food in the Last Year: Never true    Ran Out of Food in the Last Year: Never true  Transportation Needs: No Transportation Needs (01/24/2022)   PRAPARE - Transportation    Lack of  Transportation (Medical): No    Lack of Transportation (Non-Medical): No  Physical Activity: Inactive (01/24/2022)   Exercise Vital Sign    Days of Exercise per Week: 0 days    Minutes of Exercise per Session: 0 min  Stress: No Stress Concern Present (01/24/2022)   [employer] of Occupational Health - Occupational Stress Questionnaire    Feeling of Stress : Not at all  Social Connections: Moderately Integrated (01/24/2022)   Social Connection and Isolation Panel [NHANES]    Frequency of Communication with Friends and Family: More than three times a week    Frequency of Social Gatherings with Friends and Family: More than three times a week    Attends Religious Services: More than 4 times per year    Active Member of [employer] or Organizations: Yes    Attends Engineer, structural: More than 4 times per year    Marital Status: Divorced  Intimate Partner Violence: Not At Risk (01/24/2022)   Humiliation, Afraid, Rape, and Kick questionnaire    Fear of Current or Ex-Partner: No    Emotionally Abused: No    Physically Abused: No    Sexually Abused: No    Review of Systems: Pertinent positive and negative review of systems were noted in the above HPI section.  All other review of systems was otherwise negative.   Physical Exam: Vital signs in last 24 hours: Temp:  [97.5 F (36.4 C)-98.6 F (37 C)] 98.4 F (36.9 C) (07/31 0740) Pulse Rate:  [78-96] 78 (07/31 0800) Resp:  [12-17] 12 (07/31 0800) BP: (113-153)/(48-125) 114/49 (07/31 0800) SpO2:  [99 %-100 %] 100 % (07/31 0800) Weight:  [69 kg-69.9 kg] 69.9 kg (07/31 0331) Last BM Date :  (PTA) General:   Alert,  Well-developed, well-nourished elderly white female, pleasant and cooperative in NAD Head:  Normocephalic and atraumatic. Eyes:  Sclera clear, no icterus.   Conjunctiva pale Ears:  Normal auditory acuity. Nose:  No deformity, discharge,  or lesions. Mouth:  No deformity or lesions.   Neck:  Supple; no masses or thyromegaly. Lungs:  Clear throughout to auscultation.   No wheezes, crackles, or rhonchi . Heart:  Regular rate and rhythm; no murmurs, clicks, rubs,  or gallops. Abdomen:  Soft,nontender, BS active,nonpalp mass or hsm.   Rectal: Not done-occult positive yesterday Msk:   Symmetrical without gross deformities. . Pulses:  Normal pulses noted. Extremities:  Without clubbing or edema. Neurologic:  Alert and  oriented x4;  grossly normal neurologically. Skin:  Intact without significant lesions or rashes.. Psych:  Alert and cooperative. Normal mood and affect.  Intake/Output from previous day: 07/30 0701 - 07/31 0700 In: 315 [Blood:315] Out: 450 [Urine:450] Intake/Output this shift: No intake/output data recorded.  Lab Results: Recent Labs    09/06/22 2040 09/07/22 0356 09/07/22 0723  WBC 10.7* 11.4*  --   HGB 5.2* 7.9* 7.9*  HCT 17.8* 26.7* 26.6*  PLT 772* 638*  --    BMET Recent Labs    09/06/22 2040 09/07/22 0356  NA 133* 132*  K 3.5 4.0  CL 101 101  CO2 26 22  GLUCOSE 110* 100*  BUN 13 11  CREATININE 0.47 0.32*  CALCIUM 8.6* 8.7*   LFT Recent Labs    09/07/22 0356  PROT 6.0*  ALBUMIN 2.1*  AST 30  ALT 14  ALKPHOS 53  BILITOT 1.2   PT/INR No results for input(s): "LABPROT", "INR" in the last 72 hours. Hepatitis Panel No results for input(s): "HEPBSAG", "HCVAB", "  HEPAIGM", "HEPBIGM" in the last 72 hours.   IMPRESSION:  #46 88 year old white female with history of proctosigmoiditis, with moderate activity noted at last sigmoidoscopy September 2023. She had been treated at that time with a course of steroids, then budesonide, and Lialda at 4.8 g daily. Is continued on 2.4 g daily over the past 3 to 4 months, not on budesonide  She has not had any recent active symptoms i.e. no diarrhea, rectal pain or rectal bleeding  #2 acute on chronic anemia with hemoglobin of 5.2 on presentation and documented heme positive  Do not think she is having an acute GI bleed, but may have had some chronic slow GI blood loss over the past 4 months, at which point hemoglobin was 8.9  #3 incidental finding of DVT and right lower lobe PE-expect subacute  #4 COPD #5.  History of hypertension #6.  Hypothyroid #7.  Chronic pain  syndrome-bedridden over the past 4 months, and on MS Contin at home-hospice services at home and transition to a nursing home last week  PLAN: Would not plan any endoscopic intervention at this time Okay for regular diet Trend hemoglobin, check iron studies today Increase Lialda to 2.4 g p.o. twice daily If anticoagulation to be initiated she will need careful monitoring of her hemoglobin as an outpatient, as this certainly may increase the likelihood of chronic GI blood loss from the proctosigmoiditis.  Consider IV iron during this admission if iron deficient  Palliative care seeing today  Amy Esterwood PA-C 09/07/2022, 11:00 AM  GI ATTENDING  History, laboratories, x-rays, prior endoscopy reports and pathology reviewed.  Patient seen and examined as outlined above.  Agree with comprehensive consultation note as outlined above.  No additions or deletions.  No plans for endoscopic interventions.  Subsequent valuation by palliative care states that the patient's wishes are to return back to her care facility with hospice.  She is not interested in hospitalization, blood work, or interventions.  I concur.  Will sign off.  Wilhemina Bonito. Eda Keys., M.D. [facility] Division of Gastroenterology

## 2022-09-07 NOTE — Consult Note (Signed)
Consultation Note Date: 09/07/2022   Patient Name: [name]  DOB: March 09, 1934  MRN: [id number]  Age / Sex: 88 y.o., female   PCP: System, Provider Not In Referring Physician: Cathren Harsh, MD  Reason for Consultation: Establishing goals of care and symptom management     Chief Complaint/History of Present Illness:   Patient is an 88 year old female with a past medical history of ulcerative colitis on mesalamine, hypothyroidism, chronic pain syndrome, chronic constipation, hypertension, and pressure injury as to sacrum and buttocks present on admission who was admitted 09/06/2022 for management of abnormal labs with a hemoglobin of 4.8 at her skilled nursing facility associated with generalized weakness.  Patient has been wheelchair-bound since March and gets total care at her facility.  Patient was also receiving support through [facility] hospice.  GI consulted to assist with care in setting of likely GI bleed.  Upon admission patient also found to have pulmonary embolism and left femoral DVT in setting of bed bound status. Palliative medicine team consulted to assist with complex medical decision making.  Extensive review of EMR prior to presenting to bedside.  Cussed care with bedside RN for updates.  Patient has not had any active signs of bleeding.  Patient received blood transfusion with increase in hemoglobin on repeat labs.  Presented to bedside to meet with patient.  Patient welcoming visit at that time.  Patient's son, Ramon Dredge, was present at bedside.  Introduced myself and the role of the palliative medicine team in patient's care.  Spent extensive time learning about patient's medical journey up into this point.  Patient has been essentially bedbound since March of this year.  Discussed patient had been receiving support through [facility] or care hospice.  Patient was transferred to long-term care skilled nursing facility section of [facility] on Friday when labs were obtained  and patient was found to have low hemoglobin and so sent to the hospital.  Able to discuss pathways for medical care moving forward including continuing with aggressive medical management such as coming back to the hospital or returning to [facility] to focus on patient's comfort at the end of life.  Patient can easily discussed that she continues to desire comfort focused care.  Acknowledges and discussed appropriate symptom management. Discussed we will transition to full comfort focused care while here in the hospital while working to get patient back to her facility with hospice support.  Did spend time providing emotional support as well as patient is grieving her loss of independence with her bedbound status being that she was so active before March of this year.  Acknowledged this and spent time providing emotional support via active listening.  Took time to complete DNR and MOST form electing patient's wishes for comfort focused care only to return to facility with her.  All questions answered at that time.  Noted palliative medicine team will be available.  Updated IDT regarding patient's transition to full comfort focused care and desire to return to her facility with hospice which she was receiving through [facility] or care prior to being transferred to the hospital.  Primary Diagnoses  Present on Admission:  Symptomatic anemia  Palliative Review of Systems: pain  Past Medical History:  Diagnosis Date   ABDOMINAL PAIN, CHRONIC 05/28/2008   ALLERGIC RHINITIS 09/21/2006   ANEMIA-NOS 09/21/2006   ASTHMA 09/21/2006   ASTHMA 09/21/2006   ASYMPTOMATIC POSTMENOPAUSAL STATUS 05/28/2008   Cataract    CHEST PAIN, ATYPICAL 05/28/2008   COPD (chronic obstructive pulmonary disease) (  HCC)    COVID-19 virus infection 02/2020   DDD (degenerative disc disease)    DIVERTICULITIS, HX OF 09/21/2006   GERD 09/21/2006   HYPERCHOLESTEROLEMIA 06/05/2009   HYPERTENSION 09/21/2006    HYPOTHYROIDISM 09/21/2006   Peptic ulcer disease    Purpura (HCC)    RENAL INSUFFICIENCY 09/21/2006   Shingles    Small bowel obstruction (HCC)    Somatization disorder 04/25/2007   UNSPECIFIED URINARY CALCULUS 05/28/2008   UTI (urinary tract infection) 07/2019   UTI (urinary tract infection) 11/08/2021   UTI (urinary tract infection) 02/2022   Social History   Socioeconomic History   Marital status: Divorced    Spouse name: Not on file   Number of children: 3   Years of education: Not on file   Highest education level: Not on file  Occupational History   Occupation: retired  Tobacco Use   Smoking status: Never   Smokeless tobacco: Never  Vaping Use   Vaping status: Never Used  Substance and Sexual Activity   Alcohol use: No   Drug use: No   Sexual activity: Not Currently  Other Topics Concern   Not on file  Social History Narrative   Not on file   Social Determinants of Health   Financial Resource Strain: Low Risk  (01/24/2022)   Overall Financial Resource Strain (CARDIA)    Difficulty of Paying Living Expenses: Not hard at all  Food Insecurity: No Food Insecurity (01/24/2022)   Hunger Vital Sign    Worried About Running Out of Food in the Last Year: Never true    Ran Out of Food in the Last Year: Never true  Transportation Needs: No Transportation Needs (01/24/2022)   PRAPARE - Administrator, Civil Service (Medical): No    Lack of Transportation (Non-Medical): No  Physical Activity: Inactive (01/24/2022)   Exercise Vital Sign    Days of Exercise per Week: 0 days    Minutes of Exercise per Session: 0 min  Stress: No Stress Concern Present (01/24/2022)   [employer] of Occupational Health - Occupational Stress Questionnaire    Feeling of Stress : Not at all  Social Connections: Moderately Integrated (01/24/2022)   Social Connection and Isolation Panel [NHANES]    Frequency of Communication with Friends and Family: More than three times a  week    Frequency of Social Gatherings with Friends and Family: More than three times a week    Attends Religious Services: More than 4 times per year    Active Member of [employer] or Organizations: Yes    Attends Engineer, structural: More than 4 times per year    Marital Status: Divorced   Family History  Problem Relation Age of Onset   Polycystic kidney disease Mother    Pancreatic cancer Mother    Other Father        Schamberg disease   Bladder Cancer Father    Hypertension Father    Marfan syndrome Son    Hemochromatosis Son    Cirrhosis Son    Allergic rhinitis Sister    Other Brother        bone issue as child; multiple fractures but seemed to age out of this   Hemochromatosis Cousin    Arthritis Sister    Colon cancer Neg Hx    Esophageal cancer Neg Hx    Stomach cancer Neg Hx    Scheduled Meds:  sodium chloride   Intravenous Once   amoxicillin-clavulanate  1 tablet Oral  Q12H   Chlorhexidine Gluconate Cloth  6 each Topical Daily   Gerhardt's butt cream   Topical BID   leptospermum manuka honey  1 Application Topical Daily   levothyroxine  200 mcg Oral Q0600   lidocaine  1 patch Transdermal Daily   mesalamine  2.4 g Oral Q breakfast   morphine  15 mg Oral BID   pantoprazole (PROTONIX) IV  40 mg Intravenous BID   polyethylene glycol  17 g Oral Daily   pregabalin  200 mg Oral BID   senna  2 tablet Oral BID   Continuous Infusions: PRN Meds:.acetaminophen, HYDROmorphone (DILAUDID) injection, melatonin, morphine injection, mouth rinse, oxyCODONE, polyethylene glycol, prochlorperazine Allergies  Allergen Reactions   [place] Nut (Berthollefia Excelsa) Anaphylaxis   Fluconazole Shortness Of Breath   Metronidazole Shortness Of Breath and Nausea And Vomiting   Pepcid [Famotidine] Shortness Of Breath and Other (See Comments)    Dizziness   Shellfish Allergy Anaphylaxis   Macrobid [Nitrofurantoin]     REACTION: Syncope   Pantoprazole Other (See Comments)    CHEST  PAIN   CBC:    Component Value Date/Time   WBC 11.4 (H) 09/07/2022 0356   HGB 7.9 (L) 09/07/2022 0723   HGB 11.8 (L) 01/19/2022 1010   HCT 26.6 (L) 09/07/2022 0723   PLT 638 (H) 09/07/2022 0356   PLT 354 01/19/2022 1010   MCV 80.4 09/07/2022 0356   MCV 91.8 09/14/2015 1447   NEUTROABS 5.9 09/06/2022 2040   LYMPHSABS 2.0 09/06/2022 2040   MONOABS 1.6 (H) 09/06/2022 2040   EOSABS 1.1 (H) 09/06/2022 2040   BASOSABS 0.1 09/06/2022 2040   Comprehensive Metabolic Panel:    Component Value Date/Time   NA 132 (L) 09/07/2022 0356   K 4.0 09/07/2022 0356   CL 101 09/07/2022 0356   CO2 22 09/07/2022 0356   BUN 11 09/07/2022 0356   CREATININE 0.32 (L) 09/07/2022 0356   CREATININE 0.64 08/14/2020 1618   GLUCOSE 100 (H) 09/07/2022 0356   CALCIUM 8.7 (L) 09/07/2022 0356   CALCIUM 10.6 (H) 06/06/2009 0142   AST 30 09/07/2022 0356   ALT 14 09/07/2022 0356   ALKPHOS 53 09/07/2022 0356   BILITOT 1.2 09/07/2022 0356   PROT 6.0 (L) 09/07/2022 0356   PROT 6.2 03/31/2022 1622   ALBUMIN 2.1 (L) 09/07/2022 0356    Physical Exam: Vital Signs: BP (!) 114/49   Pulse 78   Temp 98.4 F (36.9 C) (Axillary)   Resp 12   Ht 5\' 7"  (1.702 m)   Wt 69.9 kg   SpO2 100%   BMI 24.14 kg/m  SpO2: SpO2: 100 % O2 Device: O2 Device: Nasal Cannula O2 Flow Rate: O2 Flow Rate (L/min): 3 L/min Intake/output summary:  Intake/Output Summary (Last 24 hours) at 09/07/2022 0932 Last data filed at 09/07/2022 0343 Gross per 24 hour  Intake 315 ml  Output 450 ml  Net -135 ml   LBM: Last BM Date :  (PTA) Baseline Weight: Weight: 69 kg Most recent weight: Weight: 69.9 kg  General: NAD, alert, pale, chronically ill appearing, cachectic, frail  Eyes: no drainage noted HENT: dry mucous membranes Cardiovascular: RRR Respiratory: no increased work of breathing noted, not in respiratory distress Abdomen: not distended Skin: no rashes or lesions on visible skin Neuro: A&Ox4, following commands easily Psych:  appropriately answers all questions          Palliative Performance Scale: 30%              Additional  Data Reviewed: Recent Labs    09/06/22 2040 09/07/22 0356 09/07/22 0723  WBC 10.7* 11.4*  --   HGB 5.2* 7.9* 7.9*  PLT 772* 638*  --   NA 133* 132*  --   BUN 13 11  --   CREATININE 0.47 0.32*  --     Imaging: CT Angio Chest Pulmonary Embolism (PE) W or WO Contrast CLINICAL DATA:  Left external iliac vein thrombus and right lower lobe pulmonary artery embolus seen on the CT of the abdomen pelvis.  EXAM: CT ANGIOGRAPHY CHEST WITH CONTRAST  TECHNIQUE: Multidetector CT imaging of the chest was performed using the standard protocol during bolus administration of intravenous contrast. Multiplanar CT image reconstructions and MIPs were obtained to evaluate the vascular anatomy.  RADIATION DOSE REDUCTION: This exam was performed according to the departmental dose-optimization program which includes automated exposure control, adjustment of the mA and/or kV according to patient size and/or use of iterative reconstruction technique.  CONTRAST:  50mL OMNIPAQUE IOHEXOL 350 MG/ML SOLN  COMPARISON:  CT of the abdomen pelvis dated 09/06/2022.  FINDINGS: Cardiovascular: There is no cardiomegaly or pericardial effusion. There is coronary vascular calcification. Mild atherosclerotic calcification of the thoracic aorta. No aneurysmal dilatation or dissection. The origins of the great vessels of the aortic arch appear patent. Right lower lobe segmental and subsegmental pulmonary artery embolus as reported on the CT of the abdomen pelvis. No CT evidence of right heart straining.  Mediastinum/Nodes: No obvious hilar or mediastinal adenopathy. Evaluation however is limited due to consolidative changes of the adjacent lungs. There is a large hiatal hernia containing a portion of the stomach. No mediastinal fluid collection.  Lungs/Pleura: Small bilateral pleural effusions, left  greater right. There is associated partial compressive atelectasis of the lower lobes. Pneumonia is not excluded. Left upper lobe and right middle lobe pulmonary nodules measure up to 18 mm in the left upper lobe. There is diffuse interstitial and interlobular septal prominence suggestive of edema. There is no pneumothorax. The central airways are patent.  Upper Abdomen: Cholecystectomy.  Musculoskeletal: Osteopenia with degenerative changes of the spine. No acute osseous pathology.  Review of the MIP images confirms the above findings.  IMPRESSION: 1. Right lower lobe segmental and subsegmental pulmonary artery embolus as reported on the CT of abdomen pelvis. No CT evidence of right heart straining. 2. Small bilateral pleural effusions, left greater right, with associated partial compressive atelectasis of the lower lobes. Pneumonia is not excluded. 3. Bilateral pulmonary nodules measure up to 18 mm in the left upper lobe. Non-contrast chest CT at 3-6 months is recommended. If the nodules are stable at time of repeat CT, then future CT at 18-24 months (from today's scan) is considered optional for low-risk patients, but is recommended for high-risk patients. This recommendation follows the consensus statement: Guidelines for Management of Incidental Pulmonary Nodules Detected on CT Images: From the Fleischner Society 2017; Radiology 2017; 284:228-243. 4. Large hiatal hernia. 5.  Aortic Atherosclerosis (ICD10-I70.0).  Electronically Signed   By: Elgie Collard M.D.   On: 09/07/2022 00:37    I personally reviewed recent imaging.   Palliative Care Assessment and Plan Summary of Established Goals of Care and Medical Treatment Preferences   Patient is an 88 year old female with a past medical history of ulcerative colitis on mesalamine, hypothyroidism, chronic pain syndrome, chronic constipation, hypertension, and pressure injury as to sacrum and buttocks present on admission  who was admitted 09/06/2022 for management of abnormal labs with a hemoglobin of 4.8 at  her skilled nursing facility associated with generalized weakness.  Patient has been wheelchair-bound since March and gets total care at her facility.  Patient was also receiving support through [facility] hospice.  GI consulted to assist with care in setting of likely GI bleed.  Upon admission patient also found to have pulmonary embolism and left femoral DVT in setting of bed bound status. Palliative medicine team consulted to assist with complex medical decision making.  # Complex medical decision making/goals of care  -Extensive discussion with patient and son, Ramon Dredge at bedside, as detailed above in HPI.  Patient and son agreeing with comfort focused care only.  Notes there was confusion when patient transferred over to different section of her long-term care unit and so labs were obtained and when they should not have been because patient was receiving hospice care and she was sent to the hospital.  Patient's goal is to no longer have lab work, IV fluids, return to the hospital, and to focus on symptom management at the end of life.  Patient wants to return to her facility with hospice support.  Son agreeing with this plan.  -At this time we will discontinue interventions that are no longer focused on comfort such as IV fluids, imaging, or lab work.  Will instead focus on symptom management of pain, dyspnea, and agitation in the setting of end-of-life care.  -Placed MOST form and DNR form on file directing patient's wishes for comfort focused care only.  -  Code Status: DNR  Prognosis: < 6 months  # Symptom management   -Pain/Dyspnea, acute in the setting of end-of-life care                Patient was not on medications for pain previously.                              -Continue MS Contin 15mg  q12hours   -Discontinue oxycodone   -Start morphine solution 5-10mg  q4hrs prn   -Change IV morphine to 1mg  q1hr prn  breakthrough after oral medications Start IV Dilaudid 0.2 mg IV every 1 hour as needed.  Continue to adjust based on patient's symptom burden.  If patient needing frequent dosing, may need to consider continuous infusion.                  -Anxiety/agitation, in the setting of end-of-life care                               -Start as needed. Continue to adjust based on patient's symptom burden.                   -Secretions, in the setting of end-of-life care                               -Start glycopyrrolate as needed.   -Constipation   -Continue Miralax 17gm scheduled daily   -Continue senna 2 tabs BID  # Psycho-social/Spiritual Support:  - Support System: sonRamon Dredge  # Discharge Planning:  Skilled Nursing Facility with Hospice  Thank you for allowing the palliative care team to participate in the care Charlena Cross.  Alvester Morin, DO Palliative Care Provider PMT # [phone]  If patient remains symptomatic despite maximum doses, please call PMT at [phone] between 0700 and 1900. Outside of these  hours, please call attending, as PMT does not have night coverage.  This provider spent a total of 95 minutes providing patient's care.  Includes review of EMR, discussing care with other staff members involved in patient's medical care, obtaining relevant history and information from patient and/or patient's family, and personal review of imaging and lab work. Greater than 50% of the time was spent counseling and coordinating care related to the above assessment and plan.    *Please note that this is a verbal dictation therefore any spelling or grammatical errors are due to the "Dragon [employer] One" system interpretation.

## 2022-09-07 NOTE — TOC Transition Note (Addendum)
Transition of Care [facility]) - CM/SW Discharge Note   Patient Details  Name: [name] MRN: [id number] Date of Birth: 1934/08/29  Transition of Care [facility]) CM/SW Contact:  Lavenia Atlas, RN Phone Number: 09/07/2022, 2:58 PM   Clinical Narrative:   Per chart review patient currently in [facility] SDU for symptomatic anemia. [facility] is from [facility] with home hospice w/[facility]. Patient to return to [facility] w/ hospice services in place. This RN CM spoke with patient's son Ramon Dredge who wants patient to return to [facility] today if possible.  Spoke with Soy w/[facility] and patient to return today room#[id number] [facility]), report can be called to [phone]. MD, RN,and patient's son notified.  - Sharin Mons has been called, folder is with patient's chart.  No additional TOC needs at this time.  - 3:10pm Per further review patient has a potential code 44 on file. Notified Traci w/UR to advise if this accurate, awaiting a response.  - 3:41 pm This RNCM spoke with patient's son to advise of Code 44. Edward indicating he is in the lobby for another 30 minutes. Will mail Medicare Outpatient OBS Notice if son leaves prior to this RNCM locating him. Edward request a call when patient has been picked up by [facility].   TOC will continue to follow    Final next level of care: Skilled Nursing Facility Barriers to Discharge: Barriers Resolved   Patient Goals and CMS Choice CMS Medicare.gov Compare Post Acute Care list provided to:: Patient Represenative (must comment) Dannielle Burn (son)) Choice offered to / list presented to : Adult Children  Discharge Placement                [facility] bed at: [facility] Patient to be transferred to facility by: [facility] Name of family member notified: Maudella Langill (son) Patient and family notified of of transfer: 09/07/22  Discharge Plan and Services Additional resources added to the After Visit Summary for                  DME Arranged: N/A DME Agency: NA        HH Arranged:  (home hospice services) HH Agency: [facility] Date [facility] Agency Contacted: 09/07/22 Time HH Agency Contacted: 1451 Representative spoke with at [facility] Agency: Misty with [employer]  Social Determinants of Health (SDOH) Interventions SDOH Screenings   Food Insecurity: No Food Insecurity (09/07/2022)  Housing: Low Risk  (09/07/2022)  Transportation Needs: No Transportation Needs (09/07/2022)  Utilities: Not At Risk (09/07/2022)  Alcohol Screen: Low Risk  (01/24/2022)  Depression (PHQ2-9): Low Risk  (01/24/2022)  Recent Concern: Depression (PHQ2-9) - Medium Risk (11/01/2021)  Financial Resource Strain: Low Risk  (01/24/2022)  Physical Activity: Inactive (01/24/2022)  Social Connections: Moderately Integrated (01/24/2022)  Stress: No Stress Concern Present (01/24/2022)  Tobacco Use: Low Risk  (09/06/2022)     Readmission Risk Interventions    09/07/2022    2:19 PM 10/16/2020   10:07 AM  Readmission Risk Prevention Plan  Post Dischage Appt Complete   Medication Screening Complete   Transportation Screening Complete Complete  PCP or Specialist Appt within 5-7 Days  Complete  Home Care Screening  Complete  Medication Review (RN CM)  Complete

## 2022-09-07 NOTE — Progress Notes (Signed)
[facility]                                                                              [name], is a 88 y.o. female, DOB - Feb 23, 1934, WUJ:[id number] Admit date - 09/06/2022    Outpatient Primary MD for the patient is System, Provider Not In  LOS - 1  days  Chief Complaint  Patient presents with   Abnormal Labs    Hemoglobin 4.8       Brief summary   Patient is a 88 year old female with ulcerative colitis on mesalamine, hypothyroidism, chronic pain syndrome on MS Contin, chronic constipation, presented from SNF due to abnormal labs, hemoglobin of 4.8, and generalized weakness.  Denied any active GI bleeding.  Has been wheelchair-bound since March and gets total care.  EMS was activated.  In the ED, repeated CBC revealed hemoglobin of 5.2, FOBT positive.    CT angio abdomen and pelvis for GI bleed revealed incidentally found pulmonary embolism and left lower extremity venous thrombosis.   UA returned positive for pyuria. Received 2 units packed RBCs. Of note, no formal diagnosis of dementia however per patient's son she has had cognitive decline for the past few months.  Assessment & Plan    Principal Problem: Acute blood loss anemia/symptomatic anemia, known history of GERD, iron deficiency anemia, in the setting of ulcerative colitis on mesalamine -Hemoglobin 5.2 on admission, FOBT positive.  Baseline hemoglobin 9-10 -Received 2 units packed RBCs, hemoglobin 7.9. -GI consulted, will await recommendations -Patient has been diagnosis of pulmonary embolism which will require anticoagulation, awaiting palliative goals of care (patient's son considering AC) -Continue IV Protonix -CTA abdomen pelvis did not show active bleeding, large hiatal hernia  Active Problems:   Acute pulmonary embolism (HCC), chronic left femoral vein DVT -CTA chest showed right lower lobe segmental and subsegmental pulmonary artery embolus.  No CT evidence of right heart strain.  Small  bilateral pleural effusions left greater than right with associated partial compressive atelectasis of lower lobes. -Venous Doppler lower extremity showed chronic DVT in the the left femoral vein -Await GI evaluation and palliative medicine GOC regarding anticoagulation    Hypothyroidism -Resume levothyroxine    Ulcerative colitis (HCC) -Resumed mesalamine, will await GI recommendations    Essential hypertension -BP currently stable  Chronic pain syndrome -Somewhat lethargic this morning, decreased MS Contin to 15 mg twice daily, added low-dose IV morphine for severe pain as needed    Bilateral pulmonary nodules -CTA chest showed bilateral pulmonary nodules up to 18 mm in the left upper lobe, noncontrast chest CT at 3 to 6 months recommended.  Chronic constipation -Continue home bowel regimen  Pyuria -Patient was placed on Augmentin on admission, will continue.  RN staff to check with SNF if patient has chronic Foley.   Pressure Injury Documentation: -Right sacrum, POA -Left sacrum, POA -Right and left buttocks, unstageable, POA   Estimated body mass index is 24.14 kg/m as calculated from the following:   Height as of this encounter: 5\' 7"  (1.702 m).   Weight as of this encounter: 69.9 kg.  Code Status: DNR DVT Prophylaxis:  None.  Currently not  on anticoagulation due to GI bleeding and not on SCDs due to DVT   Level of Care: Level of care: Stepdown Family Communication: No family at the bedside Disposition Plan:      Remains inpatient appropriate: Workup in progress   Procedures:  None  Consultants:   Gastroenterology Palliative medicine  Antimicrobials:   Anti-infectives (From admission, onward)    Start     Dose/Rate Route Frequency Ordered Stop   09/07/22 0400  amoxicillin-clavulanate (AUGMENTIN) 875-125 MG per tablet 1 tablet        1 tablet Oral Every 12 hours 09/07/22 4782            Medications  sodium chloride   Intravenous Once    amoxicillin-clavulanate  1 tablet Oral Q12H   Chlorhexidine Gluconate Cloth  6 each Topical Daily   Gerhardt's butt cream   Topical BID   leptospermum manuka honey  1 Application Topical Daily   levothyroxine  200 mcg Oral Q0600   lidocaine  1 patch Transdermal Daily   mesalamine  2.4 g Oral Q breakfast   morphine  15 mg Oral BID   pantoprazole (PROTONIX) IV  40 mg Intravenous BID   polyethylene glycol  17 g Oral Daily   pregabalin  200 mg Oral BID   senna  2 tablet Oral BID      Subjective:   [name] was seen and examined today.  Somewhat lethargic this morning,  follows verbal commands.  Per RN, did not swallow pills due to lethargy.  No acute nausea vomiting, abdominal pain.  No fevers.  Objective:   Vitals:   09/07/22 0600 09/07/22 0700 09/07/22 0740 09/07/22 0800  BP: (!) 137/58 (!) 133/48  (!) 114/49  Pulse:  81  78  Resp: 14 15  12   Temp:   98.4 F (36.9 C)   TempSrc:   Axillary   SpO2:  100%  100%  Weight:      Height:        Intake/Output Summary (Last 24 hours) at 09/07/2022 1114 Last data filed at 09/07/2022 0343 Gross per 24 hour  Intake 315 ml  Output 450 ml  Net -135 ml     Wt Readings from Last 3 Encounters:  09/07/22 69.9 kg  04/10/22 69.4 kg  03/31/22 69.4 kg     Exam General: Oriented to self, somewhat lethargic but easily arousable and follows commands Cardiovascular: S1 S2 auscultated,  RRR Respiratory: Clear to auscultation bilaterally, no wheezing Gastrointestinal: Soft, nontender, nondistended, + bowel sounds Ext: no pedal edema bilaterally Neuro: no new FND's Psych: somewhat lethargic    Data Reviewed:  I have personally reviewed following labs    CBC Lab Results  Component Value Date   WBC 11.4 (H) 09/07/2022   RBC 3.32 (L) 09/07/2022   HGB 7.9 (L) 09/07/2022   HCT 26.6 (L) 09/07/2022   MCV 80.4 09/07/2022   MCH 23.8 (L) 09/07/2022   PLT 638 (H) 09/07/2022   MCHC 29.6 (L) 09/07/2022   RDW 19.6 (H) 09/07/2022    LYMPHSABS 2.0 09/06/2022   MONOABS 1.6 (H) 09/06/2022   EOSABS 1.1 (H) 09/06/2022   BASOSABS 0.1 09/06/2022     Last metabolic panel Lab Results  Component Value Date   NA 132 (L) 09/07/2022   K 4.0 09/07/2022   CL 101 09/07/2022   CO2 22 09/07/2022   BUN 11 09/07/2022   CREATININE 0.32 (L) 09/07/2022   GLUCOSE 100 (H) 09/07/2022   GFRNONAA >60 09/07/2022  GFRAA >60 08/09/2019   CALCIUM 8.7 (L) 09/07/2022   PHOS 3.1 09/07/2022   PROT 6.0 (L) 09/07/2022   ALBUMIN 2.1 (L) 09/07/2022   LABGLOB 2.7 03/31/2022   BILITOT 1.2 09/07/2022   ALKPHOS 53 09/07/2022   AST 30 09/07/2022   ALT 14 09/07/2022   ANIONGAP 9 09/07/2022    CBG (last 3)  No results for input(s): "GLUCAP" in the last 72 hours.    Coagulation Profile: No results for input(s): "INR", "PROTIME" in the last 168 hours.   Radiology Studies: I have personally reviewed the imaging studies  VAS [place] LOWER EXTREMITY VENOUS (DVT)  Result Date: 09/07/2022  Lower Venous DVT Study Patient Name:  [name]  Date of Exam:   09/07/2022 Medical Rec #: [id number]     Accession #:    [id number] Date of Birth: [birth year]      Patient Gender: F Patient Age:   89 years Exam Location:  [facility] Procedure:      VAS [place] LOWER EXTREMITY VENOUS (DVT) Referring Phys: Dow Adolph --------------------------------------------------------------------------------  Indications: Pulmonary embolism.  Risk Factors: Confirmed PE. Limitations: Poor ultrasound/tissue interface and poor patient positioning, patient immobility, patient pain tolerance. Comparison Study: No prior studies. Performing Technologist: Chanda Busing RVT  Examination Guidelines: A complete evaluation includes B-mode imaging, spectral Doppler, color Doppler, and power Doppler as needed of all accessible portions of each vessel. Bilateral testing is considered an integral part of a complete examination. Limited examinations for reoccurring indications may be performed as  noted. The reflux portion of the exam is performed with the patient in reverse Trendelenburg.  +---------+---------------+---------+-----------+----------+-------------------+ RIGHT    CompressibilityPhasicitySpontaneityPropertiesThrombus Aging      +---------+---------------+---------+-----------+----------+-------------------+ CFV      Full           Yes      Yes                                      +---------+---------------+---------+-----------+----------+-------------------+ SFJ      Full                                                             +---------+---------------+---------+-----------+----------+-------------------+ FV Prox  Full                                                             +---------+---------------+---------+-----------+----------+-------------------+ FV Mid   Full                                                             +---------+---------------+---------+-----------+----------+-------------------+ FV Distal               Yes      Yes                                      +---------+---------------+---------+-----------+----------+-------------------+  POP      Full           Yes      Yes                                      +---------+---------------+---------+-----------+----------+-------------------+ PTV      Full                                                             +---------+---------------+---------+-----------+----------+-------------------+ PERO                                                  Not well visualized +---------+---------------+---------+-----------+----------+-------------------+   +---------+---------------+---------+-----------+----------+--------------+ LEFT     CompressibilityPhasicitySpontaneityPropertiesThrombus Aging +---------+---------------+---------+-----------+----------+--------------+ CFV      Full           Yes      Yes                                  +---------+---------------+---------+-----------+----------+--------------+ SFJ      Full                                                        +---------+---------------+---------+-----------+----------+--------------+ FV Prox  Partial        Yes      Yes                  Chronic        +---------+---------------+---------+-----------+----------+--------------+ FV Mid   Full                                                        +---------+---------------+---------+-----------+----------+--------------+ FV DistalFull                                                        +---------+---------------+---------+-----------+----------+--------------+ PFV      Full                                                        +---------+---------------+---------+-----------+----------+--------------+ POP                     Yes      Yes                                 +---------+---------------+---------+-----------+----------+--------------+  PTV      Full                                                        +---------+---------------+---------+-----------+----------+--------------+ PERO     Full                                                        +---------+---------------+---------+-----------+----------+--------------+    Summary: RIGHT: - There is no evidence of deep vein thrombosis in the lower extremity. However, portions of this examination were limited- see technologist comments above.  - No cystic structure found in the popliteal fossa.  LEFT: - Findings consistent with chronic deep vein thrombosis involving the left femoral vein. - No cystic structure found in the popliteal fossa.  *See table(s) above for measurements and observations.    Preliminary    CT Angio Chest Pulmonary Embolism (PE) W or WO Contrast  Result Date: 09/07/2022 CLINICAL DATA:  Left external iliac vein thrombus and right lower lobe pulmonary artery embolus seen on the CT of the  abdomen pelvis. EXAM: CT ANGIOGRAPHY CHEST WITH CONTRAST TECHNIQUE: Multidetector CT imaging of the chest was performed using the standard protocol during bolus administration of intravenous contrast. Multiplanar CT image reconstructions and MIPs were obtained to evaluate the vascular anatomy. RADIATION DOSE REDUCTION: This exam was performed according to the departmental dose-optimization program which includes automated exposure control, adjustment of the mA and/or kV according to patient size and/or use of iterative reconstruction technique. CONTRAST:  50mL OMNIPAQUE IOHEXOL 350 MG/ML SOLN COMPARISON:  CT of the abdomen pelvis dated 09/06/2022. FINDINGS: Cardiovascular: There is no cardiomegaly or pericardial effusion. There is coronary vascular calcification. Mild atherosclerotic calcification of the thoracic aorta. No aneurysmal dilatation or dissection. The origins of the great vessels of the aortic arch appear patent. Right lower lobe segmental and subsegmental pulmonary artery embolus as reported on the CT of the abdomen pelvis. No CT evidence of right heart straining. Mediastinum/Nodes: No obvious hilar or mediastinal adenopathy. Evaluation however is limited due to consolidative changes of the adjacent lungs. There is a large hiatal hernia containing a portion of the stomach. No mediastinal fluid collection. Lungs/Pleura: Small bilateral pleural effusions, left greater right. There is associated partial compressive atelectasis of the lower lobes. Pneumonia is not excluded. Left upper lobe and right middle lobe pulmonary nodules measure up to 18 mm in the left upper lobe. There is diffuse interstitial and interlobular septal prominence suggestive of edema. There is no pneumothorax. The central airways are patent. Upper Abdomen: Cholecystectomy. Musculoskeletal: Osteopenia with degenerative changes of the spine. No acute osseous pathology. Review of the MIP images confirms the above findings. IMPRESSION: 1.  Right lower lobe segmental and subsegmental pulmonary artery embolus as reported on the CT of abdomen pelvis. No CT evidence of right heart straining. 2. Small bilateral pleural effusions, left greater right, with associated partial compressive atelectasis of the lower lobes. Pneumonia is not excluded. 3. Bilateral pulmonary nodules measure up to 18 mm in the left upper lobe. Non-contrast chest CT at 3-6 months is recommended. If the nodules are stable at time of repeat CT, then future CT at 18-24 months (from  today's scan) is considered optional for low-risk patients, but is recommended for high-risk patients. This recommendation follows the consensus statement: Guidelines for Management of Incidental Pulmonary Nodules Detected on CT Images: From the Fleischner Society 2017; Radiology 2017; 284:228-243. 4. Large hiatal hernia. 5.  Aortic Atherosclerosis (ICD10-I70.0). Electronically Signed   By: Elgie Collard M.D.   On: 09/07/2022 00:37   CT Angio Abd/Pel W and/or Wo Contrast  Result Date: 09/06/2022 CLINICAL DATA:  Decreased hemoglobin EXAM: CTA ABDOMEN AND PELVIS WITHOUT AND WITH CONTRAST TECHNIQUE: Multidetector CT imaging of the abdomen and pelvis was performed using the standard protocol during bolus administration of intravenous contrast. Multiplanar reconstructed images and MIPs were obtained and reviewed to evaluate the vascular anatomy. RADIATION DOSE REDUCTION: This exam was performed according to the departmental dose-optimization program which includes automated exposure control, adjustment of the mA and/or kV according to patient size and/or use of iterative reconstruction technique. CONTRAST:  OMNIPAQUE IOHEXOL 350 MG/ML SOLN COMPARISON:  10/19/2021 FINDINGS: VASCULAR Aorta: Normal caliber aorta without aneurysm, dissection, vasculitis or significant stenosis. Atherosclerosis. Celiac: High-grade stenosis at the origin of the celiac artery estimated greater than 90%. Distal branches of the  celiac artery remain widely patent. No aneurysm, dissection, or vasculitis. SMA: Patent without evidence of aneurysm, dissection, vasculitis or significant stenosis. Renals: Both renal arteries are patent without evidence of aneurysm, dissection, vasculitis, fibromuscular dysplasia or significant stenosis. IMA: Patent without evidence of aneurysm, dissection, vasculitis or significant stenosis. Inflow: Patent without evidence of aneurysm, dissection, vasculitis or significant stenosis. Proximal Outflow: Bilateral common femoral and visualized portions of the superficial and profunda femoral arteries are patent without evidence of aneurysm, dissection, vasculitis or significant stenosis. Veins: Suspected nonocclusive DVT within the left external iliac vein, reference image 76/18. Otherwise unremarkable. Review of the MIP images confirms the above findings. NON-VASCULAR Lower chest: On the arterial phase images, there is evidence of incidental right lower lobe segmental pulmonary embolus. No other filling defects. There are bilateral pleural effusions, left greater than right, with dependent lower lobe atelectasis. Hepatobiliary: No focal liver abnormality is seen. Status post cholecystectomy. No biliary dilatation. Pancreas: Unremarkable. No pancreatic ductal dilatation or surrounding inflammatory changes. Spleen: Normal in size without focal abnormality. Adrenals/Urinary Tract: Adrenal glands are unremarkable. Kidneys are normal, without renal calculi, focal lesion, or hydronephrosis. Bladder is decompressed with a Foley catheter, limiting its evaluation. Stomach/Bowel: No bowel obstruction or ileus. Cecum is once again noted within the left lower quadrant, which may be postsurgical. No bowel wall thickening or inflammatory change. Large hiatal hernia. Distal colonic diverticulosis without diverticulitis. High attenuation stool throughout the colon limits evaluation for underlying hemorrhage. There is no  intraluminal contrast accumulation within the bowel to suggest active gastrointestinal hemorrhage on this study. Lymphatic: No pathologic adenopathy within the abdomen or pelvis. Reproductive: Status post hysterectomy. No adnexal masses. Other: No free fluid or free intraperitoneal gas. Fat containing midline supraumbilical ventral hernia. No bowel herniation. Musculoskeletal: No acute or destructive bony abnormalities. Stable severe right hip osteoarthritis with reactive joint effusion. Reconstructed images demonstrate no additional findings. IMPRESSION: VASCULAR 1. No evidence of active gastrointestinal hemorrhage, though evaluation is limited due to the presence of high attenuation stool throughout the colon. 2. Incidental nonocclusive left external iliac vein thrombus. 3. Incidental right lower lobe segmental pulmonary embolus. 4. Stable high-grade stenosis at the origin of the celiac artery. 5.  Aortic Atherosclerosis (ICD10-I70.0). NON-VASCULAR 1. Bilateral pleural effusions and dependent lower lobe atelectasis, left greater than right. 2. Distal colonic diverticulosis without diverticulitis.  3. Stable hiatal hernia. Critical Value/emergent results were called by telephone at the time of interpretation on 09/06/2022 at 10:40 pm to provider DR Julieanne Manson, who verbally acknowledged these results. Electronically Signed   By: Sharlet Salina M.D.   On: 09/06/2022 22:40       Chelsey Redondo M.D. [facility] 09/07/2022, 11:14 AM  Available via Epic secure chat 7am-7pm After 7 pm, please refer to night coverage provider listed on amion.

## 2022-10-04 ENCOUNTER — Other Ambulatory Visit: Payer: Self-pay | Admitting: Family Medicine

## 2022-10-09 DEATH — deceased
